# Patient Record
Sex: Female | Born: 1945
Health system: Southern US, Community
[De-identification: ages and names within clinical notes are randomized; demographics above are authoritative.]

## PROBLEM LIST (undated history)

## (undated) DIAGNOSIS — E782 Mixed hyperlipidemia: Secondary | ICD-10-CM

## (undated) DIAGNOSIS — R7989 Other specified abnormal findings of blood chemistry: Secondary | ICD-10-CM

## (undated) DIAGNOSIS — A159 Respiratory tuberculosis unspecified: Secondary | ICD-10-CM

## (undated) DIAGNOSIS — M8580 Other specified disorders of bone density and structure, unspecified site: Secondary | ICD-10-CM

## (undated) DIAGNOSIS — N189 Chronic kidney disease, unspecified: Secondary | ICD-10-CM

## (undated) DIAGNOSIS — N3946 Mixed incontinence: Secondary | ICD-10-CM

## (undated) DIAGNOSIS — I2510 Atherosclerotic heart disease of native coronary artery without angina pectoris: Secondary | ICD-10-CM

## (undated) DIAGNOSIS — IMO0001 Reserved for inherently not codable concepts without codable children: Secondary | ICD-10-CM

## (undated) DIAGNOSIS — I10 Essential (primary) hypertension: Secondary | ICD-10-CM

## (undated) DIAGNOSIS — I499 Cardiac arrhythmia, unspecified: Secondary | ICD-10-CM

## (undated) DIAGNOSIS — Z9119 Patient's noncompliance with other medical treatment and regimen: Secondary | ICD-10-CM

## (undated) DIAGNOSIS — E1129 Type 2 diabetes mellitus with other diabetic kidney complication: Secondary | ICD-10-CM

## (undated) DIAGNOSIS — J45909 Unspecified asthma, uncomplicated: Secondary | ICD-10-CM

## (undated) DIAGNOSIS — F411 Generalized anxiety disorder: Secondary | ICD-10-CM

## (undated) DIAGNOSIS — E559 Vitamin D deficiency, unspecified: Secondary | ICD-10-CM

## (undated) DIAGNOSIS — D638 Anemia in other chronic diseases classified elsewhere: Secondary | ICD-10-CM

## (undated) DIAGNOSIS — J449 Chronic obstructive pulmonary disease, unspecified: Secondary | ICD-10-CM

## (undated) DIAGNOSIS — Z91199 Patient's noncompliance with other medical treatment and regimen due to unspecified reason: Secondary | ICD-10-CM

## (undated) DIAGNOSIS — R45851 Suicidal ideations: Secondary | ICD-10-CM

## (undated) DIAGNOSIS — E1169 Type 2 diabetes mellitus with other specified complication: Secondary | ICD-10-CM

## (undated) DIAGNOSIS — R579 Shock, unspecified: Secondary | ICD-10-CM

## (undated) HISTORY — PX: CARPAL TUNNEL RELEASE: SHX101

## (undated) HISTORY — DX: Vitamin D deficiency, unspecified: E55.9

## (undated) HISTORY — DX: Patient's noncompliance with other medical treatment and regimen due to unspecified reason: Z91.199

## (undated) HISTORY — DX: Atherosclerotic heart disease of native coronary artery without angina pectoris: I25.10

## (undated) HISTORY — DX: Type 2 diabetes mellitus with other specified complication: E78.2

## (undated) HISTORY — DX: Type 2 diabetes mellitus with other specified complication: E11.69

## (undated) HISTORY — PX: ANKLE RECONSTRUCTION: SHX1151

## (undated) HISTORY — DX: Type 2 diabetes mellitus with other diabetic kidney complication: E11.29

## (undated) HISTORY — DX: Suicidal ideations: R45.851

## (undated) HISTORY — DX: Anemia in other chronic diseases classified elsewhere: D63.8

## (undated) HISTORY — DX: Other specified abnormal findings of blood chemistry: R79.89

## (undated) HISTORY — DX: Generalized anxiety disorder: F41.1

## (undated) HISTORY — DX: Mixed incontinence: N39.46

## (undated) HISTORY — DX: Chronic kidney disease, unspecified: N18.9

## (undated) HISTORY — DX: Patient's noncompliance with other medical treatment and regimen: Z91.19

## (undated) HISTORY — DX: Respiratory tuberculosis unspecified: A15.9

## (undated) HISTORY — PX: CORONARY ANGIOPLASTY: SHX604

## (undated) HISTORY — DX: Other specified disorders of bone density and structure, unspecified site: M85.80

---

## 1997-11-02 ENCOUNTER — Other Ambulatory Visit: Admission: RE | Admit: 1997-11-02 | Discharge: 1997-11-02 | Payer: Self-pay | Admitting: Obstetrics and Gynecology

## 1998-07-10 ENCOUNTER — Emergency Department (HOSPITAL_COMMUNITY): Admission: EM | Admit: 1998-07-10 | Discharge: 1998-07-10 | Payer: Self-pay | Admitting: Emergency Medicine

## 1998-07-11 ENCOUNTER — Encounter: Payer: Self-pay | Admitting: Emergency Medicine

## 2003-03-12 ENCOUNTER — Other Ambulatory Visit: Payer: Self-pay

## 2004-02-17 ENCOUNTER — Emergency Department: Payer: Self-pay | Admitting: Internal Medicine

## 2004-04-19 ENCOUNTER — Ambulatory Visit: Payer: Self-pay | Admitting: Internal Medicine

## 2004-11-08 ENCOUNTER — Emergency Department: Payer: Self-pay | Admitting: Emergency Medicine

## 2005-01-20 ENCOUNTER — Emergency Department: Payer: Self-pay | Admitting: Emergency Medicine

## 2005-04-12 ENCOUNTER — Inpatient Hospital Stay: Payer: Self-pay | Admitting: Internal Medicine

## 2005-04-12 ENCOUNTER — Other Ambulatory Visit: Payer: Self-pay

## 2005-06-18 ENCOUNTER — Ambulatory Visit: Payer: Self-pay | Admitting: Internal Medicine

## 2005-06-24 ENCOUNTER — Ambulatory Visit: Payer: Self-pay | Admitting: Internal Medicine

## 2005-06-27 ENCOUNTER — Ambulatory Visit: Payer: Self-pay | Admitting: Internal Medicine

## 2005-07-17 ENCOUNTER — Other Ambulatory Visit: Payer: Self-pay

## 2005-07-17 ENCOUNTER — Inpatient Hospital Stay: Payer: Self-pay | Admitting: Cardiology

## 2005-08-28 ENCOUNTER — Encounter: Payer: Self-pay | Admitting: Internal Medicine

## 2005-09-28 ENCOUNTER — Encounter: Payer: Self-pay | Admitting: Internal Medicine

## 2005-10-22 ENCOUNTER — Other Ambulatory Visit: Payer: Self-pay

## 2005-10-22 ENCOUNTER — Emergency Department: Payer: Self-pay

## 2005-10-28 ENCOUNTER — Encounter: Payer: Self-pay | Admitting: Internal Medicine

## 2006-02-28 HISTORY — PX: COLONOSCOPY: SHX174

## 2006-03-05 ENCOUNTER — Ambulatory Visit: Payer: Self-pay | Admitting: Unknown Physician Specialty

## 2006-03-05 LAB — HM COLONOSCOPY

## 2006-03-31 ENCOUNTER — Ambulatory Visit: Payer: Self-pay | Admitting: Internal Medicine

## 2007-03-27 ENCOUNTER — Ambulatory Visit: Payer: Self-pay | Admitting: Internal Medicine

## 2007-08-08 ENCOUNTER — Emergency Department: Payer: Self-pay | Admitting: Emergency Medicine

## 2007-08-08 ENCOUNTER — Other Ambulatory Visit: Payer: Self-pay

## 2007-10-09 ENCOUNTER — Other Ambulatory Visit: Payer: Self-pay

## 2007-10-09 ENCOUNTER — Emergency Department: Payer: Self-pay | Admitting: Internal Medicine

## 2008-04-25 ENCOUNTER — Ambulatory Visit: Payer: Self-pay

## 2008-06-02 ENCOUNTER — Ambulatory Visit: Payer: Self-pay | Admitting: Internal Medicine

## 2008-06-22 ENCOUNTER — Ambulatory Visit: Payer: Self-pay | Admitting: Internal Medicine

## 2008-10-19 ENCOUNTER — Ambulatory Visit: Payer: Self-pay | Admitting: Internal Medicine

## 2008-12-07 ENCOUNTER — Emergency Department: Payer: Self-pay | Admitting: Emergency Medicine

## 2009-02-07 ENCOUNTER — Emergency Department: Payer: Self-pay | Admitting: Internal Medicine

## 2009-05-09 ENCOUNTER — Ambulatory Visit: Payer: Self-pay | Admitting: Nephrology

## 2009-06-05 ENCOUNTER — Ambulatory Visit: Payer: Self-pay | Admitting: Internal Medicine

## 2009-08-10 ENCOUNTER — Encounter: Payer: Self-pay | Admitting: Surgery

## 2009-12-08 ENCOUNTER — Emergency Department: Payer: Self-pay | Admitting: Emergency Medicine

## 2009-12-11 ENCOUNTER — Other Ambulatory Visit: Payer: Self-pay | Admitting: Orthopedic Surgery

## 2010-04-09 ENCOUNTER — Emergency Department: Payer: Self-pay | Admitting: Emergency Medicine

## 2010-05-29 ENCOUNTER — Inpatient Hospital Stay: Payer: Self-pay | Admitting: Internal Medicine

## 2010-07-11 ENCOUNTER — Ambulatory Visit: Payer: Self-pay | Admitting: Internal Medicine

## 2010-07-11 LAB — HM MAMMOGRAPHY

## 2011-02-04 LAB — PULMONARY FUNCTION TEST

## 2011-05-29 ENCOUNTER — Emergency Department: Payer: Self-pay

## 2011-05-29 LAB — COMPREHENSIVE METABOLIC PANEL
Albumin: 3.8 g/dL (ref 3.4–5.0)
Alkaline Phosphatase: 86 U/L (ref 50–136)
Anion Gap: 9 (ref 7–16)
BUN: 24 mg/dL — ABNORMAL HIGH (ref 7–18)
Bilirubin,Total: 0.3 mg/dL (ref 0.2–1.0)
Calcium, Total: 9.8 mg/dL (ref 8.5–10.1)
Chloride: 100 mmol/L (ref 98–107)
Co2: 28 mmol/L (ref 21–32)
Creatinine: 1.55 mg/dL — ABNORMAL HIGH (ref 0.60–1.30)
EGFR (African American): 40 — ABNORMAL LOW
EGFR (Non-African Amer.): 35 — ABNORMAL LOW
Glucose: 362 mg/dL — ABNORMAL HIGH (ref 65–99)
Osmolality: 293 (ref 275–301)
Potassium: 3.9 mmol/L (ref 3.5–5.1)
SGOT(AST): 14 U/L — ABNORMAL LOW (ref 15–37)
SGPT (ALT): 15 U/L
Sodium: 137 mmol/L (ref 136–145)
Total Protein: 8.9 g/dL — ABNORMAL HIGH (ref 6.4–8.2)

## 2011-05-29 LAB — CBC
HCT: 33.5 % — ABNORMAL LOW (ref 35.0–47.0)
HGB: 10.7 g/dL — ABNORMAL LOW (ref 12.0–16.0)
MCH: 28.8 pg (ref 26.0–34.0)
MCHC: 32 g/dL (ref 32.0–36.0)
MCV: 90 fL (ref 80–100)
Platelet: 356 10*3/uL (ref 150–440)
RBC: 3.72 10*6/uL — ABNORMAL LOW (ref 3.80–5.20)
RDW: 14.7 % — ABNORMAL HIGH (ref 11.5–14.5)
WBC: 9.5 10*3/uL (ref 3.6–11.0)

## 2011-09-17 ENCOUNTER — Ambulatory Visit: Payer: Self-pay | Admitting: Internal Medicine

## 2011-09-17 LAB — HM MAMMOGRAPHY

## 2011-10-14 ENCOUNTER — Ambulatory Visit: Payer: Self-pay | Admitting: Internal Medicine

## 2011-10-29 ENCOUNTER — Ambulatory Visit: Payer: Self-pay | Admitting: Internal Medicine

## 2012-01-29 LAB — HM PAP SMEAR: HM Pap smear: NEGATIVE

## 2012-02-19 LAB — HM PAP SMEAR: HM Pap smear: NEGATIVE

## 2012-03-25 LAB — HM DIABETES EYE EXAM

## 2012-11-02 ENCOUNTER — Ambulatory Visit: Payer: Self-pay | Admitting: Physician Assistant

## 2012-11-02 LAB — HM MAMMOGRAPHY

## 2013-09-07 LAB — HM DIABETES EYE EXAM

## 2013-11-11 ENCOUNTER — Ambulatory Visit: Payer: Self-pay | Admitting: Physician Assistant

## 2013-11-11 LAB — HM MAMMOGRAPHY

## 2014-02-17 DIAGNOSIS — M79674 Pain in right toe(s): Secondary | ICD-10-CM | POA: Diagnosis not present

## 2014-02-17 DIAGNOSIS — B351 Tinea unguium: Secondary | ICD-10-CM | POA: Diagnosis not present

## 2014-02-17 DIAGNOSIS — M79675 Pain in left toe(s): Secondary | ICD-10-CM | POA: Diagnosis not present

## 2014-03-24 DIAGNOSIS — F331 Major depressive disorder, recurrent, moderate: Secondary | ICD-10-CM | POA: Diagnosis not present

## 2014-03-24 DIAGNOSIS — D638 Anemia in other chronic diseases classified elsewhere: Secondary | ICD-10-CM | POA: Diagnosis not present

## 2014-03-24 DIAGNOSIS — I2510 Atherosclerotic heart disease of native coronary artery without angina pectoris: Secondary | ICD-10-CM | POA: Diagnosis not present

## 2014-03-24 DIAGNOSIS — R079 Chest pain, unspecified: Secondary | ICD-10-CM | POA: Diagnosis not present

## 2014-03-24 DIAGNOSIS — I129 Hypertensive chronic kidney disease with stage 1 through stage 4 chronic kidney disease, or unspecified chronic kidney disease: Secondary | ICD-10-CM | POA: Diagnosis not present

## 2014-03-24 DIAGNOSIS — E1151 Type 2 diabetes mellitus with diabetic peripheral angiopathy without gangrene: Secondary | ICD-10-CM | POA: Diagnosis not present

## 2014-04-04 DIAGNOSIS — R42 Dizziness and giddiness: Secondary | ICD-10-CM | POA: Diagnosis not present

## 2014-04-04 DIAGNOSIS — E1165 Type 2 diabetes mellitus with hyperglycemia: Secondary | ICD-10-CM | POA: Diagnosis not present

## 2014-04-04 DIAGNOSIS — R0602 Shortness of breath: Secondary | ICD-10-CM | POA: Diagnosis not present

## 2014-04-04 DIAGNOSIS — R079 Chest pain, unspecified: Secondary | ICD-10-CM | POA: Diagnosis not present

## 2014-04-12 ENCOUNTER — Ambulatory Visit
Admit: 2014-04-12 | Disposition: A | Discharge status: Home / Self Care | Payer: Self-pay | Attending: Physician Assistant | Admitting: Physician Assistant

## 2014-04-12 DIAGNOSIS — Z794 Long term (current) use of insulin: Secondary | ICD-10-CM | POA: Diagnosis not present

## 2014-04-12 DIAGNOSIS — E119 Type 2 diabetes mellitus without complications: Secondary | ICD-10-CM | POA: Diagnosis not present

## 2014-04-13 DIAGNOSIS — R0602 Shortness of breath: Secondary | ICD-10-CM | POA: Diagnosis not present

## 2014-04-21 DIAGNOSIS — R55 Syncope and collapse: Secondary | ICD-10-CM | POA: Diagnosis not present

## 2014-04-21 DIAGNOSIS — R9431 Abnormal electrocardiogram [ECG] [EKG]: Secondary | ICD-10-CM | POA: Diagnosis not present

## 2014-04-29 ENCOUNTER — Ambulatory Visit
Admit: 2014-04-29 | Disposition: A | Discharge status: Home / Self Care | Payer: Self-pay | Attending: Physician Assistant | Admitting: Physician Assistant

## 2014-05-09 DIAGNOSIS — E119 Type 2 diabetes mellitus without complications: Secondary | ICD-10-CM | POA: Diagnosis not present

## 2014-05-09 DIAGNOSIS — Z794 Long term (current) use of insulin: Secondary | ICD-10-CM | POA: Diagnosis not present

## 2014-05-30 DIAGNOSIS — I4510 Unspecified right bundle-branch block: Secondary | ICD-10-CM | POA: Diagnosis not present

## 2014-05-30 DIAGNOSIS — E1151 Type 2 diabetes mellitus with diabetic peripheral angiopathy without gangrene: Secondary | ICD-10-CM | POA: Diagnosis not present

## 2014-05-30 DIAGNOSIS — I2510 Atherosclerotic heart disease of native coronary artery without angina pectoris: Secondary | ICD-10-CM | POA: Diagnosis not present

## 2014-05-30 DIAGNOSIS — I129 Hypertensive chronic kidney disease with stage 1 through stage 4 chronic kidney disease, or unspecified chronic kidney disease: Secondary | ICD-10-CM | POA: Diagnosis not present

## 2014-05-31 ENCOUNTER — Other Ambulatory Visit: Payer: Self-pay | Admitting: Internal Medicine

## 2014-05-31 DIAGNOSIS — I4510 Unspecified right bundle-branch block: Secondary | ICD-10-CM

## 2014-06-03 ENCOUNTER — Ambulatory Visit: Payer: Self-pay

## 2014-06-08 ENCOUNTER — Encounter
Admission: RE | Admit: 2014-06-08 | Discharge: 2014-06-08 | Disposition: A | Discharge status: Home / Self Care | Payer: Commercial Managed Care - HMO | Source: Ambulatory Visit | Attending: Internal Medicine | Admitting: Internal Medicine

## 2014-06-08 ENCOUNTER — Ambulatory Visit
Admission: RE | Admit: 2014-06-08 | Discharge: 2014-06-08 | Disposition: A | Discharge status: Home / Self Care | Payer: Commercial Managed Care - HMO | Source: Ambulatory Visit | Attending: Internal Medicine | Admitting: Internal Medicine

## 2014-06-08 DIAGNOSIS — I4510 Unspecified right bundle-branch block: Secondary | ICD-10-CM | POA: Insufficient documentation

## 2014-06-08 DIAGNOSIS — R0789 Other chest pain: Secondary | ICD-10-CM | POA: Diagnosis not present

## 2014-06-08 HISTORY — DX: Essential (primary) hypertension: I10

## 2014-06-08 HISTORY — DX: Cardiac arrhythmia, unspecified: I49.9

## 2014-06-08 HISTORY — DX: Reserved for inherently not codable concepts without codable children: IMO0001

## 2014-06-08 HISTORY — DX: Chronic obstructive pulmonary disease, unspecified: J44.9

## 2014-06-08 HISTORY — DX: Unspecified asthma, uncomplicated: J45.909

## 2014-06-08 LAB — NM MYOCAR MULTI W/SPECT W/WALL MOTION / EF
Estimated workload: 5.7 METS
LV dias vol: 48 mL
LV sys vol: 13 mL
Peak HR: 111 {beats}/min
Percent of predicted max HR: 73 %
SDS: 6
SRS: 2
SSS: 6
Stage 1 Grade: 0 %
Stage 1 HR: 70 {beats}/min
Stage 1 Speed: 0 mph
Stage 2 Grade: 0 %
Stage 2 HR: 77 {beats}/min
Stage 2 Speed: 1 mph
Stage 3 Grade: 0 %
Stage 3 HR: 77 {beats}/min
Stage 3 Speed: 1 mph
Stage 4 DBP: 57 mmHg
Stage 4 Grade: 10 %
Stage 4 HR: 97 {beats}/min
Stage 4 SBP: 158 mmHg
Stage 4 Speed: 1.7 mph
Stage 5 Grade: 12 %
Stage 5 HR: 109 {beats}/min
Stage 5 Speed: 2.5 mph
Stage 6 Grade: 2.1 %
Stage 6 HR: 111 {beats}/min
Stage 6 Speed: 2.5 mph
Stage 7 DBP: 61 mmHg
Stage 7 Grade: 0 %
Stage 7 HR: 107 {beats}/min
Stage 7 SBP: 186 mmHg
Stage 7 Speed: 0 mph
Stage 8 DBP: 62 mmHg
Stage 8 Grade: 0 %
Stage 8 HR: 86 {beats}/min
Stage 8 SBP: 156 mmHg
Stage 8 Speed: 0 mph
TID: 0.85

## 2014-06-08 MED ORDER — TECHNETIUM TC 99M SESTAMIBI GENERIC - CARDIOLITE
27.6190 | Freq: Once | INTRAVENOUS | Status: AC | PRN
  Administered 2014-06-08: 27.619 via INTRAVENOUS

## 2014-06-08 MED ORDER — TECHNETIUM TC 99M SESTAMIBI GENERIC - CARDIOLITE
12.8140 | Freq: Once | INTRAVENOUS | Status: AC | PRN
  Administered 2014-06-08: 12.814 via INTRAVENOUS

## 2014-06-30 DIAGNOSIS — Z0001 Encounter for general adult medical examination with abnormal findings: Secondary | ICD-10-CM | POA: Diagnosis not present

## 2014-06-30 DIAGNOSIS — E1151 Type 2 diabetes mellitus with diabetic peripheral angiopathy without gangrene: Secondary | ICD-10-CM | POA: Diagnosis not present

## 2014-06-30 DIAGNOSIS — D638 Anemia in other chronic diseases classified elsewhere: Secondary | ICD-10-CM | POA: Diagnosis not present

## 2014-06-30 DIAGNOSIS — I2510 Atherosclerotic heart disease of native coronary artery without angina pectoris: Secondary | ICD-10-CM | POA: Diagnosis not present

## 2014-06-30 DIAGNOSIS — E782 Mixed hyperlipidemia: Secondary | ICD-10-CM | POA: Diagnosis not present

## 2014-06-30 DIAGNOSIS — I129 Hypertensive chronic kidney disease with stage 1 through stage 4 chronic kidney disease, or unspecified chronic kidney disease: Secondary | ICD-10-CM | POA: Diagnosis not present

## 2014-06-30 DIAGNOSIS — R300 Dysuria: Secondary | ICD-10-CM | POA: Diagnosis not present

## 2014-07-28 DIAGNOSIS — Z0001 Encounter for general adult medical examination with abnormal findings: Secondary | ICD-10-CM | POA: Diagnosis not present

## 2014-07-28 DIAGNOSIS — E782 Mixed hyperlipidemia: Secondary | ICD-10-CM | POA: Diagnosis not present

## 2014-07-28 DIAGNOSIS — E1165 Type 2 diabetes mellitus with hyperglycemia: Secondary | ICD-10-CM | POA: Diagnosis not present

## 2014-08-08 ENCOUNTER — Emergency Department: Payer: Commercial Managed Care - HMO

## 2014-08-08 ENCOUNTER — Encounter: Payer: Self-pay | Admitting: *Deleted

## 2014-08-08 ENCOUNTER — Emergency Department
Admission: EM | Admit: 2014-08-08 | Discharge: 2014-08-08 | Disposition: A | Discharge status: Home / Self Care | Payer: Commercial Managed Care - HMO | Attending: Emergency Medicine | Admitting: Emergency Medicine

## 2014-08-08 DIAGNOSIS — S8002XA Contusion of left knee, initial encounter: Secondary | ICD-10-CM | POA: Insufficient documentation

## 2014-08-08 DIAGNOSIS — E119 Type 2 diabetes mellitus without complications: Secondary | ICD-10-CM | POA: Diagnosis not present

## 2014-08-08 DIAGNOSIS — Y998 Other external cause status: Secondary | ICD-10-CM | POA: Insufficient documentation

## 2014-08-08 DIAGNOSIS — Y92410 Unspecified street and highway as the place of occurrence of the external cause: Secondary | ICD-10-CM | POA: Diagnosis not present

## 2014-08-08 DIAGNOSIS — Y9389 Activity, other specified: Secondary | ICD-10-CM | POA: Diagnosis not present

## 2014-08-08 DIAGNOSIS — S8992XA Unspecified injury of left lower leg, initial encounter: Secondary | ICD-10-CM | POA: Diagnosis present

## 2014-08-08 DIAGNOSIS — I10 Essential (primary) hypertension: Secondary | ICD-10-CM | POA: Insufficient documentation

## 2014-08-08 DIAGNOSIS — M1712 Unilateral primary osteoarthritis, left knee: Secondary | ICD-10-CM | POA: Diagnosis not present
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
## 2014-08-08 NOTE — Discharge Instructions (Signed)
Contusion A contusion is a deep bruise. Contusions happen when an injury causes bleeding under the skin. Signs of bruising include pain, puffiness (swelling), and discolored skin. The contusion may turn blue, purple, or yellow. HOME CARE   Put ice on the injured area.  Put ice in a plastic bag.  Place a towel between your skin and the bag.  Leave the ice on for 15-20 minutes, 03-04 times a day.  Only take medicine as told by your doctor.  Rest the injured area.  If possible, raise (elevate) the injured area to lessen puffiness. GET HELP RIGHT AWAY IF:   You have more bruising or puffiness.  You have pain that is getting worse.  Your puffiness or pain is not helped by medicine. MAKE SURE YOU:   Understand these instructions.  Will watch your condition.  Will get help right away if you are not doing well or get worse. Document Released: 07/03/2007 Document Revised: 04/08/2011 Document Reviewed: 11/19/2010 [facility] Patient Information 2015 [place], [place]. This information is not intended to replace advice given to you by your health care provider. Make sure you discuss any questions you have with your health care provider.  Cryotherapy Cryotherapy is when you put ice on your injury. Ice helps lessen pain and puffiness (swelling) after an injury. Ice works the best when you start using it in the first 24 to 48 hours after an injury. HOME CARE  Put a dry or damp towel between the ice pack and your skin.  You may press gently on the ice pack.  Leave the ice on for no more than 10 to 20 minutes at a time.  Check your skin after 5 minutes to make sure your skin is okay.  Rest at least 20 minutes between ice pack uses.  Stop using ice when your skin loses feeling (numbness).  Do not use ice on someone who cannot tell you when it hurts. This includes small children and people with memory problems (dementia). GET HELP RIGHT AWAY IF:  You have white spots on your  skin.  Your skin turns blue or pale.  Your skin feels waxy or hard.  Your puffiness gets worse. MAKE SURE YOU:   Understand these instructions.  Will watch your condition.  Will get help right away if you are not doing well or get worse. Document Released: 07/03/2007 Document Revised: 04/08/2011 Document Reviewed: 09/06/2010 [facility] Patient Information 2015 [place], [place]. This information is not intended to replace advice given to you by your health care provider. Make sure you discuss any questions you have with your health care provider.

## 2014-09-16 DIAGNOSIS — M25562 Pain in left knee: Secondary | ICD-10-CM | POA: Diagnosis not present

## 2014-09-16 DIAGNOSIS — M1712 Unilateral primary osteoarthritis, left knee: Secondary | ICD-10-CM | POA: Diagnosis not present

## 2014-10-04 DIAGNOSIS — I10 Essential (primary) hypertension: Secondary | ICD-10-CM | POA: Diagnosis not present

## 2014-10-04 DIAGNOSIS — E1165 Type 2 diabetes mellitus with hyperglycemia: Secondary | ICD-10-CM | POA: Diagnosis not present

## 2014-10-04 DIAGNOSIS — E782 Mixed hyperlipidemia: Secondary | ICD-10-CM | POA: Diagnosis not present

## 2014-10-04 DIAGNOSIS — F331 Major depressive disorder, recurrent, moderate: Secondary | ICD-10-CM | POA: Diagnosis not present

## 2014-10-04 DIAGNOSIS — D638 Anemia in other chronic diseases classified elsewhere: Secondary | ICD-10-CM | POA: Diagnosis not present

## 2014-10-05 DIAGNOSIS — M79674 Pain in right toe(s): Secondary | ICD-10-CM | POA: Diagnosis not present

## 2014-10-05 DIAGNOSIS — B351 Tinea unguium: Secondary | ICD-10-CM | POA: Diagnosis not present

## 2014-10-05 DIAGNOSIS — M79675 Pain in left toe(s): Secondary | ICD-10-CM | POA: Diagnosis not present

## 2014-11-28 DIAGNOSIS — M8588 Other specified disorders of bone density and structure, other site: Secondary | ICD-10-CM | POA: Diagnosis not present

## 2014-11-28 DIAGNOSIS — Z1231 Encounter for screening mammogram for malignant neoplasm of breast: Secondary | ICD-10-CM | POA: Diagnosis not present

## 2014-11-28 DIAGNOSIS — Z780 Asymptomatic menopausal state: Secondary | ICD-10-CM | POA: Diagnosis not present

## 2014-11-28 DIAGNOSIS — Z13820 Encounter for screening for osteoporosis: Secondary | ICD-10-CM | POA: Diagnosis not present

## 2014-11-28 DIAGNOSIS — E2839 Other primary ovarian failure: Secondary | ICD-10-CM | POA: Diagnosis not present

## 2014-11-28 DIAGNOSIS — N649 Disorder of breast, unspecified: Secondary | ICD-10-CM | POA: Diagnosis not present

## 2014-11-28 LAB — HM DEXA SCAN

## 2014-11-28 LAB — HM MAMMOGRAPHY

## 2015-01-02 DIAGNOSIS — I10 Essential (primary) hypertension: Secondary | ICD-10-CM | POA: Diagnosis not present

## 2015-01-02 DIAGNOSIS — E1165 Type 2 diabetes mellitus with hyperglycemia: Secondary | ICD-10-CM | POA: Diagnosis not present

## 2015-01-02 DIAGNOSIS — N189 Chronic kidney disease, unspecified: Secondary | ICD-10-CM | POA: Diagnosis not present

## 2015-01-02 DIAGNOSIS — E782 Mixed hyperlipidemia: Secondary | ICD-10-CM | POA: Diagnosis not present

## 2015-01-02 DIAGNOSIS — J069 Acute upper respiratory infection, unspecified: Secondary | ICD-10-CM | POA: Diagnosis not present

## 2015-01-20 DIAGNOSIS — R928 Other abnormal and inconclusive findings on diagnostic imaging of breast: Secondary | ICD-10-CM | POA: Diagnosis not present

## 2015-01-20 LAB — HM MAMMOGRAPHY

## 2015-03-25 ENCOUNTER — Emergency Department
Admission: EM | Admit: 2015-03-25 | Discharge: 2015-03-25 | Disposition: A | Discharge status: Home / Self Care | Payer: Commercial Managed Care - HMO | Attending: Emergency Medicine | Admitting: Emergency Medicine

## 2015-03-25 ENCOUNTER — Emergency Department: Payer: Commercial Managed Care - HMO

## 2015-03-25 ENCOUNTER — Encounter: Payer: Self-pay | Admitting: Emergency Medicine

## 2015-03-25 DIAGNOSIS — E1165 Type 2 diabetes mellitus with hyperglycemia: Secondary | ICD-10-CM | POA: Diagnosis not present

## 2015-03-25 DIAGNOSIS — I10 Essential (primary) hypertension: Secondary | ICD-10-CM | POA: Diagnosis not present

## 2015-03-25 DIAGNOSIS — Z7984 Long term (current) use of oral hypoglycemic drugs: Secondary | ICD-10-CM | POA: Diagnosis not present

## 2015-03-25 DIAGNOSIS — J9811 Atelectasis: Secondary | ICD-10-CM | POA: Diagnosis not present

## 2015-03-25 DIAGNOSIS — Z794 Long term (current) use of insulin: Secondary | ICD-10-CM | POA: Diagnosis not present

## 2015-03-25 DIAGNOSIS — R739 Hyperglycemia, unspecified: Secondary | ICD-10-CM

## 2015-03-25 DIAGNOSIS — Z87891 Personal history of nicotine dependence: Secondary | ICD-10-CM | POA: Diagnosis not present

## 2015-03-25 DIAGNOSIS — Z9119 Patient's noncompliance with other medical treatment and regimen: Secondary | ICD-10-CM | POA: Diagnosis not present

## 2015-03-25 DIAGNOSIS — Z9114 Patient's other noncompliance with medication regimen: Secondary | ICD-10-CM | POA: Diagnosis not present

## 2015-03-25 LAB — CBC WITH DIFFERENTIAL/PLATELET
Basophils Absolute: 0.1 10*3/uL (ref 0–0.1)
Basophils Relative: 1 %
Eosinophils Absolute: 0.2 10*3/uL (ref 0–0.7)
Eosinophils Relative: 2 %
HCT: 32.7 % — ABNORMAL LOW (ref 35.0–47.0)
Hemoglobin: 11 g/dL — ABNORMAL LOW (ref 12.0–16.0)
Lymphocytes Relative: 23 %
Lymphs Abs: 2 10*3/uL (ref 1.0–3.6)
MCH: 30 pg (ref 26.0–34.0)
MCHC: 33.6 g/dL (ref 32.0–36.0)
MCV: 89.2 fL (ref 80.0–100.0)
Monocytes Absolute: 0.5 10*3/uL (ref 0.2–0.9)
Monocytes Relative: 6 %
Neutro Abs: 5.8 10*3/uL (ref 1.4–6.5)
Neutrophils Relative %: 68 %
Platelets: 284 10*3/uL (ref 150–440)
RBC: 3.67 MIL/uL — ABNORMAL LOW (ref 3.80–5.20)
RDW: 14.4 % (ref 11.5–14.5)
WBC: 8.5 10*3/uL (ref 3.6–11.0)

## 2015-03-25 LAB — URINALYSIS COMPLETE WITH MICROSCOPIC (ARMC ONLY)
Bilirubin Urine: NEGATIVE
Glucose, UA: >500 mg/dL — AB
Hgb urine dipstick: NEGATIVE
Ketones, ur: NEGATIVE mg/dL
Leukocytes, UA: NEGATIVE
Nitrite: NEGATIVE
Protein, ur: NEGATIVE mg/dL
Specific Gravity, Urine: 1.021 (ref 1.005–1.030)
pH: 6 (ref 5.0–8.0)

## 2015-03-25 LAB — COMPREHENSIVE METABOLIC PANEL
ALT: 12 U/L — ABNORMAL LOW (ref 14–54)
AST: 24 U/L (ref 15–41)
Albumin: 3.6 g/dL (ref 3.5–5.0)
Alkaline Phosphatase: 82 U/L (ref 38–126)
Anion gap: 9 (ref 5–15)
BUN: 29 mg/dL — ABNORMAL HIGH (ref 6–20)
CO2: 25 mmol/L (ref 22–32)
Calcium: 9.5 mg/dL (ref 8.9–10.3)
Chloride: 97 mmol/L — ABNORMAL LOW (ref 101–111)
Creatinine, Ser: 1.84 mg/dL — ABNORMAL HIGH (ref 0.44–1.00)
GFR calc Af Amer: 31 mL/min — ABNORMAL LOW (ref >60)
GFR calc non Af Amer: 27 mL/min — ABNORMAL LOW (ref >60)
Glucose, Bld: 594 mg/dL (ref 65–99)
Potassium: 4.2 mmol/L (ref 3.5–5.1)
Sodium: 131 mmol/L — ABNORMAL LOW (ref 135–145)
Total Bilirubin: 0.6 mg/dL (ref 0.3–1.2)
Total Protein: 7.5 g/dL (ref 6.5–8.1)

## 2015-03-25 LAB — TROPONIN I
Troponin I: 0.04 ng/mL — ABNORMAL HIGH (ref <0.031)
Troponin I: <0.03 ng/mL (ref <0.031)

## 2015-03-25 LAB — GLUCOSE, CAPILLARY
Glucose-Capillary: 560 mg/dL (ref 65–99)
Glucose-Capillary: 383 mg/dL — ABNORMAL HIGH (ref 65–99)
Glucose-Capillary: 329 mg/dL — ABNORMAL HIGH (ref 65–99)
Glucose-Capillary: 308 mg/dL — ABNORMAL HIGH (ref 65–99)

## 2015-03-25 LAB — RAPID INFLUENZA A&B ANTIGENS
Influenza A (ARMC): NOT DETECTED
Influenza B (ARMC): NOT DETECTED

## 2015-03-25 MED ORDER — LIRAGLUTIDE 18 MG/3ML ~~LOC~~ SOPN: 1.0000 mg | PEN_INJECTOR | Freq: Every morning | SUBCUTANEOUS | Status: DC

## 2015-03-25 MED ORDER — ONDANSETRON HCL 4 MG/2ML IJ SOLN
4.0000 mg | Freq: Once | INTRAMUSCULAR | Status: AC
Start: 2015-03-25 — End: 2015-03-25
  Administered 2015-03-25: 4 mg via INTRAVENOUS
  Filled 2015-03-25: qty 2

## 2015-03-25 MED ORDER — FENOFIBRATE 145 MG PO TABS
145.0000 mg | ORAL_TABLET | Freq: Every day | ORAL | Status: DC
Start: 2015-03-25 — End: 2016-03-01

## 2015-03-25 MED ORDER — SODIUM CHLORIDE 0.9 % IV BOLUS (SEPSIS)
1000.0000 mL | Freq: Once | INTRAVENOUS | Status: AC
  Administered 2015-03-25: 1000 mL via INTRAVENOUS

## 2015-03-25 MED ORDER — INSULIN ASPART 100 UNIT/ML ~~LOC~~ SOLN
10.0000 [IU] | Freq: Once | SUBCUTANEOUS | Status: AC
  Administered 2015-03-25: 10 [IU] via INTRAVENOUS
  Filled 2015-03-25: qty 10

## 2015-03-25 MED ORDER — INSULIN LISPRO PROT & LISPRO (50-50 MIX) 100 UNIT/ML KWIKPEN: 32.0000 [IU] | PEN_INJECTOR | Freq: Three times a day (TID) | SUBCUTANEOUS | Status: DC

## 2015-03-25 MED ORDER — INSULIN DETEMIR 100 UNIT/ML FLEXPEN: 55.0000 [IU] | PEN_INJECTOR | Freq: Every day | SUBCUTANEOUS | Status: DC

## 2015-03-25 NOTE — ED Notes (Signed)
States blood sugar has been high x 4 days. States ran out of insulin pen 2 days ago. States blood sugar over 400 at home.

## 2015-03-25 NOTE — Discharge Instructions (Signed)
Hyperglycemia °Hyperglycemia occurs when the glucose (sugar) in your blood is too high. Hyperglycemia can happen for many reasons, but it most often happens to people who do not know they have diabetes or are not managing their diabetes properly.  °CAUSES  °Whether you have diabetes or not, there are other causes of hyperglycemia. Hyperglycemia can occur when you have diabetes, but it can also occur in other situations that you might not be as aware of, such as: °Diabetes °· If you have diabetes and are having problems controlling your blood glucose, hyperglycemia could occur because of some of the following reasons: °¨ Not following your meal plan. °¨ Not taking your diabetes medications or not taking it properly. °¨ Exercising less or doing less activity than you normally do. °¨ Being sick. °Pre-diabetes °· This cannot be ignored. Before people develop Type 2 diabetes, they almost always have "pre-diabetes." This is when your blood glucose levels are higher than normal, but not yet high enough to be diagnosed as diabetes. Research has shown that some long-term damage to the body, especially the heart and circulatory system, may already be occurring during pre-diabetes. If you take action to manage your blood glucose when you have pre-diabetes, you may delay or prevent Type 2 diabetes from developing. °Stress °· If you have diabetes, you may be "diet" controlled or on oral medications or insulin to control your diabetes. However, you may find that your blood glucose is higher than usual in the hospital whether you have diabetes or not. This is often referred to as "stress hyperglycemia." Stress can elevate your blood glucose. This happens because of hormones put out by the body during times of stress. If stress has been the cause of your high blood glucose, it can be followed regularly by your caregiver. That way he/she can make sure your hyperglycemia does not continue to get worse or progress to  diabetes. °Steroids °· Steroids are medications that act on the infection fighting system (immune system) to block inflammation or infection. One side effect can be a rise in blood glucose. Most people can produce enough extra insulin to allow for this rise, but for those who cannot, steroids make blood glucose levels go even higher. It is not unusual for steroid treatments to "uncover" diabetes that is developing. It is not always possible to determine if the hyperglycemia will go away after the steroids are stopped. A special blood test called an A1c is sometimes done to determine if your blood glucose was elevated before the steroids were started. °SYMPTOMS °· Thirsty. °· Frequent urination. °· Dry mouth. °· Blurred vision. °· Tired or fatigue. °· Weakness. °· Sleepy. °· Tingling in feet or leg. °DIAGNOSIS  °Diagnosis is made by monitoring blood glucose in one or all of the following ways: °· A1c test. This is a chemical found in your blood. °· Fingerstick blood glucose monitoring. °· Laboratory results. °TREATMENT  °First, knowing the cause of the hyperglycemia is important before the hyperglycemia can be treated. Treatment may include, but is not be limited to: °· Education. °· Change or adjustment in medications. °· Change or adjustment in meal plan. °· Treatment for an illness, infection, etc. °· More frequent blood glucose monitoring. °· Change in exercise plan. °· Decreasing or stopping steroids. °· Lifestyle changes. °HOME CARE INSTRUCTIONS  °· Test your blood glucose as directed. °· Exercise regularly. Your caregiver will give you instructions about exercise. Pre-diabetes or diabetes which comes on with stress is helped by exercising. °· Eat wholesome,   balanced meals. Eat often and at regular, fixed times. Your caregiver or nutritionist will give you a meal plan to guide your sugar intake. °· Being at an ideal weight is important. If needed, losing as little as 10 to 15 pounds may help improve blood  glucose levels. °SEEK MEDICAL CARE IF:  °· You have questions about medicine, activity, or diet. °· You continue to have symptoms (problems such as increased thirst, urination, or weight gain). °SEEK IMMEDIATE MEDICAL CARE IF:  °· You are vomiting or have diarrhea. °· Your breath smells fruity. °· You are breathing faster or slower. °· You are very sleepy or incoherent. °· You have numbness, tingling, or pain in your feet or hands. °· You have chest pain. °· Your symptoms get worse even though you have been following your caregiver's orders. °· If you have any other questions or concerns. °  °This information is not intended to replace advice given to you by your health care provider. Make sure you discuss any questions you have with your health care provider. °  °Document Released: 07/10/2000 Document Revised: 04/08/2011 Document Reviewed: 09/20/2014 °Elsevier Interactive Patient Education ©2016 [employer] ° °

## 2015-03-25 NOTE — ED Provider Notes (Signed)
Time Seen: Approximately 1520  I have reviewed the triage notes  Chief Complaint: Hyperglycemia   History of Present Illness: Courtney Goodman is a 70 y.o. female who has a history of insulin-dependent diabetes, COPD, hypertension, etc. Patient states that she's been under a lot stress recently as missed some dosages of her insulin and pills for her diabetes. She's noticed that her blood sugars run high now for the last 4 days. She describes hives being over 400. She states she ran out of her insulin pen 2 days ago. She denies any fever, productive cough, dysuria though she has had some urinary frequency. She denies any chest or abdominal pain. She denies any melena or hematochezia.   Past Medical History  Diagnosis Date  . Asthma   . Hypertension   . Dysrhythmia   . COPD (chronic obstructive pulmonary disease) ([place])   . Shortness of breath dyspnea   . Diabetes mellitus without complication ([place])     There are no active problems to display for this patient.   Past Surgical History  Procedure Laterality Date  . Coronary angioplasty      Past Surgical History  Procedure Laterality Date  . Coronary angioplasty      No current outpatient prescriptions on file.  Allergies:  Review of patient's allergies indicates no known allergies.  Family History: History reviewed. No pertinent family history.  Social History: Social History  Substance Use Topics  . Smoking status: Former Research scientist (life sciences)  . Smokeless tobacco: None  . Alcohol Use: No     Review of Systems:   10 point review of systems was performed and was otherwise negative:  Constitutional: No fever Eyes: No visual disturbances ENT: No sore throat, ear pain Cardiac: No chest pain Respiratory: No shortness of breath, wheezing, or stridor Abdomen: No abdominal pain, no vomiting, No diarrhea Endocrine: No weight loss, No night sweats Extremities: No peripheral edema, cyanosis Skin: No rashes, easy  bruising Neurologic: No focal weakness, trouble with speech or swollowing Urologic: No dysuria, Hematuria, or urinary frequency   Physical Exam:  ED Triage Vitals  Enc Vitals Group     BP 03/25/15 1437 156/74 mmHg     Pulse Rate 03/25/15 1437 84     Resp 03/25/15 1437 18     Temp 03/25/15 1437 98.4 F (36.9 C)     Temp Source 03/25/15 1437 Oral     SpO2 03/25/15 1437 98 %     Weight 03/25/15 1437 180 lb (81.647 kg)     Height 03/25/15 1437 5\' 4"  (1.626 m)     Head Cir --      Peak Flow --      Pain Score 03/25/15 1438 9     Pain Loc --      Pain Edu? --      Excl. in [place]? --     General: Awake , Alert , and Oriented times 3; GCS 15 Head: Normal cephalic , atraumatic Eyes: Pupils equal , round, reactive to light Nose/Throat: No nasal drainage, patent upper airway without erythema or exudate.  Neck: Supple, Full range of motion, No anterior adenopathy or palpable thyroid masses Lungs: Clear to ascultation without wheezes , rhonchi, or rales Heart: Regular rate, regular rhythm without murmurs , gallops , or rubs Abdomen: Soft, non tender without rebound, guarding , or rigidity; bowel sounds positive and symmetric in all 4 quadrants. No organomegaly .        Extremities: 2 plus symmetric pulses. No edema,  clubbing or cyanosis Neurologic: normal ambulation, Motor symmetric without deficits, sensory intact Skin: warm, dry, no rashes   Labs:   All laboratory work was reviewed including any pertinent negatives or positives listed below:  Labs Reviewed  CBC WITH DIFFERENTIAL/PLATELET - Abnormal; Notable for the following:    RBC 3.67 (*)    Hemoglobin 11.0 (*)    HCT 32.7 (*)    All other components within normal limits  GLUCOSE, CAPILLARY - Abnormal; Notable for the following:    Glucose-Capillary 560 (*)    All other components within normal limits  RAPID INFLUENZA A&B ANTIGENS ([facility] ONLY)  COMPREHENSIVE METABOLIC PANEL  TROPONIN I  URINALYSIS COMPLETEWITH MICROSCOPIC  ([facility] ONLY)   serial troponins were performed which were overall negative  Review of laboratory work shows hyperglycemia without any evidence of diabetic ketoacidosis. EKG:  ED ECG REPORT I, Daymon Larsen, the attending physician, personally viewed and interpreted this ECG.  Date: 03/25/2015 EKG Time: *1539 Rate: 84 Rhythm: normal sinus rhythm QRS Axis: normal Intervals: Right bundle-branch block pattern ST/T Wave abnormalities: normal Conduction Disturbances: none Narrative Interpretation: unremarkable Left ventricular hypertrophy No acute ischemic changes are noted  Radiology: *    Narrative:    CLINICAL DATA: Patient with elevated blood sugar.  EXAM: CHEST 2 VIEW  COMPARISON: Chest radiograph 02/07/2009  FINDINGS: Stable cardiac and mediastinal contours. Minimal linear opacities left lung base. No large area of pulmonary consolidation. No pleural effusion or pneumothorax. Thoracic spine degenerative changes.  IMPRESSION: Left basilar atelectasis. No acute cardiopulmonary process.   Electronically Signed By: Lovey Newcomer M.D. On: 03/25/2015 18:09    I personally reviewed the radiologic studies   P  ED Course:  Patient's stay here was uneventful and she was given a overall 2 L normal saline bolus. She was given 10 units of IV regular insulin and observed for a period time here in emergency department. She was given Zofran for nausea and was able tolerate oral fluids and felt symptomatic relief. She was able to eat once we got her blood sugar close to 300. I felt she could be allowed to go home as her hyperglycemia seemed to stem from running out of her medications. The more we spoke the more and it was understood that she had run out of multiple of her medications for her diabetes. She was trying to extend about the amount of her medication and eventually ran out. Patient was given prescriptions and advised to follow up with her primary physician for  recheck. She should return here if she has any chest pain, abdominal pain, fever or any other concerns.    Assessment: * Hyperglycemia Insulin-dependent diabetes Noncompliance      Plan: * Outpatient management Patient was advised to return immediately if condition worsens. Patient was advised to follow up with their primary care physician or other specialized physicians involved in their outpatient care             Daymon Larsen, MD 03/25/15 2226

## 2015-04-06 DIAGNOSIS — I129 Hypertensive chronic kidney disease with stage 1 through stage 4 chronic kidney disease, or unspecified chronic kidney disease: Secondary | ICD-10-CM | POA: Diagnosis not present

## 2015-04-06 DIAGNOSIS — E1151 Type 2 diabetes mellitus with diabetic peripheral angiopathy without gangrene: Secondary | ICD-10-CM | POA: Diagnosis not present

## 2015-04-06 DIAGNOSIS — F331 Major depressive disorder, recurrent, moderate: Secondary | ICD-10-CM | POA: Diagnosis not present

## 2015-04-06 DIAGNOSIS — D638 Anemia in other chronic diseases classified elsewhere: Secondary | ICD-10-CM | POA: Diagnosis not present

## 2015-04-24 DIAGNOSIS — E1151 Type 2 diabetes mellitus with diabetic peripheral angiopathy without gangrene: Secondary | ICD-10-CM | POA: Diagnosis not present

## 2015-04-24 DIAGNOSIS — I129 Hypertensive chronic kidney disease with stage 1 through stage 4 chronic kidney disease, or unspecified chronic kidney disease: Secondary | ICD-10-CM | POA: Diagnosis not present

## 2015-04-24 DIAGNOSIS — G4733 Obstructive sleep apnea (adult) (pediatric): Secondary | ICD-10-CM | POA: Diagnosis not present

## 2015-04-24 DIAGNOSIS — D638 Anemia in other chronic diseases classified elsewhere: Secondary | ICD-10-CM | POA: Diagnosis not present

## 2015-05-26 DIAGNOSIS — I129 Hypertensive chronic kidney disease with stage 1 through stage 4 chronic kidney disease, or unspecified chronic kidney disease: Secondary | ICD-10-CM | POA: Diagnosis not present

## 2015-05-26 DIAGNOSIS — D638 Anemia in other chronic diseases classified elsewhere: Secondary | ICD-10-CM | POA: Diagnosis not present

## 2015-05-26 DIAGNOSIS — E1151 Type 2 diabetes mellitus with diabetic peripheral angiopathy without gangrene: Secondary | ICD-10-CM | POA: Diagnosis not present

## 2015-06-29 LAB — HM DIABETES FOOT EXAM

## 2015-07-21 DIAGNOSIS — G4733 Obstructive sleep apnea (adult) (pediatric): Secondary | ICD-10-CM | POA: Diagnosis not present

## 2015-07-21 DIAGNOSIS — E1122 Type 2 diabetes mellitus with diabetic chronic kidney disease: Secondary | ICD-10-CM | POA: Diagnosis not present

## 2015-07-21 DIAGNOSIS — I10 Essential (primary) hypertension: Secondary | ICD-10-CM | POA: Diagnosis not present

## 2015-07-21 DIAGNOSIS — D638 Anemia in other chronic diseases classified elsewhere: Secondary | ICD-10-CM | POA: Diagnosis not present

## 2015-07-21 DIAGNOSIS — F331 Major depressive disorder, recurrent, moderate: Secondary | ICD-10-CM | POA: Diagnosis not present

## 2015-07-21 DIAGNOSIS — Z0001 Encounter for general adult medical examination with abnormal findings: Secondary | ICD-10-CM | POA: Diagnosis not present

## 2015-07-21 DIAGNOSIS — I2510 Atherosclerotic heart disease of native coronary artery without angina pectoris: Secondary | ICD-10-CM | POA: Diagnosis not present

## 2015-07-21 DIAGNOSIS — E782 Mixed hyperlipidemia: Secondary | ICD-10-CM | POA: Diagnosis not present

## 2015-07-24 ENCOUNTER — Ambulatory Visit
Admission: RE | Admit: 2015-07-24 | Discharge: 2015-07-24 | Disposition: A | Discharge status: Home / Self Care | Payer: Commercial Managed Care - HMO | Source: Ambulatory Visit | Attending: Physician Assistant | Admitting: Physician Assistant

## 2015-07-24 ENCOUNTER — Other Ambulatory Visit: Payer: Self-pay | Admitting: Physician Assistant

## 2015-07-24 DIAGNOSIS — M25511 Pain in right shoulder: Secondary | ICD-10-CM | POA: Insufficient documentation

## 2015-07-24 DIAGNOSIS — W19XXXA Unspecified fall, initial encounter: Secondary | ICD-10-CM

## 2015-07-24 DIAGNOSIS — S4991XA Unspecified injury of right shoulder and upper arm, initial encounter: Secondary | ICD-10-CM | POA: Diagnosis not present

## 2015-11-21 ENCOUNTER — Telehealth: Payer: Self-pay | Admitting: Internal Medicine

## 2015-11-21 NOTE — Telephone Encounter (Signed)
Rcvd office notes, xrays, ct, ekg, labs from [facility]

## 2015-11-22 ENCOUNTER — Encounter: Payer: Self-pay | Admitting: Internal Medicine

## 2015-11-23 ENCOUNTER — Encounter: Payer: Self-pay | Admitting: Family Medicine

## 2015-11-23 ENCOUNTER — Ambulatory Visit (INDEPENDENT_AMBULATORY_CARE_PROVIDER_SITE_OTHER): Payer: Commercial Managed Care - HMO | Admitting: Family Medicine

## 2015-11-23 VITALS — BP 118/62 | HR 80 | Wt 179.6 lb

## 2015-11-23 DIAGNOSIS — E559 Vitamin D deficiency, unspecified: Secondary | ICD-10-CM | POA: Diagnosis not present

## 2015-11-23 DIAGNOSIS — Z23 Encounter for immunization: Secondary | ICD-10-CM | POA: Diagnosis not present

## 2015-11-23 DIAGNOSIS — Z79899 Other long term (current) drug therapy: Secondary | ICD-10-CM

## 2015-11-23 DIAGNOSIS — I10 Essential (primary) hypertension: Secondary | ICD-10-CM | POA: Diagnosis not present

## 2015-11-23 DIAGNOSIS — G4733 Obstructive sleep apnea (adult) (pediatric): Secondary | ICD-10-CM | POA: Diagnosis not present

## 2015-11-23 DIAGNOSIS — D649 Anemia, unspecified: Secondary | ICD-10-CM | POA: Diagnosis not present

## 2015-11-23 DIAGNOSIS — N189 Chronic kidney disease, unspecified: Secondary | ICD-10-CM | POA: Diagnosis not present

## 2015-11-23 DIAGNOSIS — N183 Chronic kidney disease, stage 3 unspecified: Secondary | ICD-10-CM | POA: Insufficient documentation

## 2015-11-23 DIAGNOSIS — Z794 Long term (current) use of insulin: Secondary | ICD-10-CM

## 2015-11-23 DIAGNOSIS — I499 Cardiac arrhythmia, unspecified: Secondary | ICD-10-CM | POA: Insufficient documentation

## 2015-11-23 DIAGNOSIS — Z7689 Persons encountering health services in other specified circumstances: Secondary | ICD-10-CM | POA: Diagnosis not present

## 2015-11-23 DIAGNOSIS — J449 Chronic obstructive pulmonary disease, unspecified: Secondary | ICD-10-CM | POA: Insufficient documentation

## 2015-11-23 DIAGNOSIS — E11649 Type 2 diabetes mellitus with hypoglycemia without coma: Secondary | ICD-10-CM

## 2015-11-23 LAB — COMPLETE METABOLIC PANEL WITH GFR
ALT: 9 U/L (ref 6–29)
AST: 19 U/L (ref 10–35)
Albumin: 4.1 g/dL (ref 3.6–5.1)
Alkaline Phosphatase: 50 U/L (ref 33–130)
BUN: 31 mg/dL — ABNORMAL HIGH (ref 7–25)
CO2: 23 mmol/L (ref 20–31)
Calcium: 9.9 mg/dL (ref 8.6–10.4)
Chloride: 107 mmol/L (ref 98–110)
Creat: 1.66 mg/dL — ABNORMAL HIGH (ref 0.60–0.93)
GFR, Est African American: 36 mL/min — ABNORMAL LOW (ref >=60)
GFR, Est Non African American: 31 mL/min — ABNORMAL LOW (ref >=60)
Glucose, Bld: 51 mg/dL — ABNORMAL LOW (ref 65–99)
Potassium: 4.1 mmol/L (ref 3.5–5.3)
Sodium: 142 mmol/L (ref 135–146)
Total Bilirubin: 0.3 mg/dL (ref 0.2–1.2)
Total Protein: 7.9 g/dL (ref 6.1–8.1)

## 2015-11-23 LAB — CBC WITH DIFFERENTIAL/PLATELET
Basophils Absolute: 0 cells/uL (ref 0–200)
Basophils Relative: 0 %
Eosinophils Absolute: 170 cells/uL (ref 15–500)
Eosinophils Relative: 2 %
HCT: 32.4 % — ABNORMAL LOW (ref 35.0–45.0)
Hemoglobin: 10.6 g/dL — ABNORMAL LOW (ref 11.7–15.5)
Lymphocytes Relative: 21 %
Lymphs Abs: 1785 cells/uL (ref 850–3900)
MCH: 29.8 pg (ref 27.0–33.0)
MCHC: 32.7 g/dL (ref 32.0–36.0)
MCV: 91 fL (ref 80.0–100.0)
MPV: 10.4 fL (ref 7.5–12.5)
Monocytes Absolute: 680 cells/uL (ref 200–950)
Monocytes Relative: 8 %
Neutro Abs: 5865 cells/uL (ref 1500–7800)
Neutrophils Relative %: 69 %
Platelets: 385 10*3/uL (ref 140–400)
RBC: 3.56 MIL/uL — ABNORMAL LOW (ref 3.80–5.10)
RDW: 15.5 % — ABNORMAL HIGH (ref 11.0–15.0)
WBC: 8.5 10*3/uL (ref 4.0–10.5)

## 2015-11-23 LAB — GLUCOSE, POCT (MANUAL RESULT ENTRY)
POC Glucose: 52 mg/dl — AB (ref 70–99)
POC Glucose: 80 mg/dl (ref 70–99)

## 2015-11-23 LAB — POCT GLYCOSYLATED HEMOGLOBIN (HGB A1C): Hemoglobin A1C: 6.5

## 2015-11-23 LAB — TSH: TSH: 2.55 mIU/L

## 2015-11-23 NOTE — Patient Instructions (Addendum)
Your hemoglobin A1c today is 6.5. I'm concerned that you are having low blood sugars.  Stop taking Januvia,   Do not take Novolog (meal time insulin) if your blood sugar is LESS THAN 130.   Continue on Victoza and Levemir at your current doses.  Continue checking your blood sugars 3-4 times daily. Call If you are having lows (less than 80).   I am referring you to a diabetes specialist, endocrinologist, and they will call you to schedule an appointment.  I am also referring you to have a sleep study done. They will call you to schedule this.  Please contact your cardiologist and nephrologist, kidney doctor, and follow-up with them. If you do not plan to follow-up with them then I need you to sign a medical release and get the records to me so that I can refer you to specialists here in town.   Return in 2 weeks for a follow-up with me.  I will call you with your lab results.

## 2015-11-23 NOTE — Progress Notes (Signed)
Subjective:    Patient ID: Courtney Goodman, female    DOB: 1945/07/27, 70 y.o.   MRN: [id number]  HPI Chief Complaint  Patient presents with  . new pt    new pt get established.    She is new to the practice and here to establish care. Moved here from [place] 2 months ago to be close to her daughter. Has not been to previous provider in 3 months.  Obstructive sleep apnea and has not been using her CPAP for more than a year.. States she plans to start using it again. States she will need to have the settings checked. She would like to have a sleep study here in [place] before she starts wearing this again.  States she feels like she might be taking too much medicine. States she does not have anyone to help her with her medications. She states she gets confused sometimes.  States she was diagnosed with type 2 diabetes approximately 10 years ago. She has been insulin dependent for at least 5 years. This morning she took Victoza 1.8, 25 units of Novolog. (states her blood sugar this morning was 51). She did eat but not much.  Her blood sugar here is 52. She is taking Levemir 55 units every evening. Also taking Januvia daily.  States she is having a lot of low blood sugars. States her previous PCP wanted her to start seeing an endocrinologist but she did not follow through with this.  She is aware that she has not been compliant with her treatment.   A1C 6.5% today   Complains of having nausea and abdominal pain for the past few weeks.  States she has been seeing a nephrologist in [place]. Dr. Adolm Joseph at [facility]. Would like to start seeing one locally. Has CKD.   Dr. Humphrey Rolls in [place] is her cardiologist. States she has a cardiac stent. Is not on an anticoagulant.   HTN- no concerns with medications. Does not check her blood pressure at home.  History of vitamin D deficiency. Is not currently taking vitamin D supplement.  History of anemia. Is taking daily  iron.   Social history: Lives alone.   Reviewed allergies, medications, past medical, surgical, family, and social history.   Review of Systems Review of Systems Constitutional: -fever, -chills, -sweats, -unexpected weight change,-fatigue Cardiology:  -chest pain, -palpitations, -edema Respiratory: -cough, -shortness of breath, -wheezing Gastroenterology: -abdominal pain, -nausea, -vomiting, -diarrhea, -constipation  Musculoskeletal: -arthralgias, -myalgias, -joint swelling, -back pain Ophthalmology: -vision changes Urology: -dysuria, -difficulty urinating, -hematuria, -urinary frequency, -urgency Neurology: -headache, -weakness, -tingling, -numbness        Objective:   Physical Exam  Constitutional: She is oriented to person, place, and time. She appears well-developed and well-nourished. No distress.  HENT:  Mouth/Throat: Oropharynx is clear and moist.  Eyes: Conjunctivae are normal. Pupils are equal, round, and reactive to light.  Neck: Normal range of motion. Neck supple. No JVD present. No thyromegaly present.  Cardiovascular: Normal rate, regular rhythm, normal heart sounds and intact distal pulses.  Exam reveals no gallop and no friction rub.   No murmur heard. Pulmonary/Chest: Effort normal and breath sounds normal.  Abdominal: Soft. Bowel sounds are normal. She exhibits no distension. There is no hepatosplenomegaly. There is no tenderness. There is no rebound, no guarding and no CVA tenderness.  Lymphadenopathy:    She has no cervical adenopathy.  Neurological: She is alert and oriented to person, place, and time. She has normal strength. She displays  no tremor. No cranial nerve deficit or sensory deficit. Gait normal.  Skin: Skin is warm and dry. No rash noted. She is not diaphoretic. No cyanosis. No pallor.  Psychiatric: She has a normal mood and affect. Her speech is normal and behavior is normal. Judgment and thought content normal. Cognition and memory are normal.    BP 118/62   Pulse 80   Wt 179 lb 9.6 oz (81.5 kg)   BMI 30.83 kg/m       Assessment & Plan:  Controlled type 2 diabetes mellitus with hypoglycemia, with long-term current use of insulin (HCC) - Plan: HgB A1c, POCT glucose (manual entry), CBC with Differential/Platelet, COMPLETE METABOLIC PANEL WITH GFR, TSH, POCT glucose (manual entry), Ambulatory referral to Endocrinology  Vitamin D deficiency - Plan: VITAMIN D 25 Hydroxy (Vit-D Deficiency, Fractures)  Hypertension, unspecified type - Plan: CBC with Differential/Platelet, COMPLETE METABOLIC PANEL WITH GFR  OSA (obstructive sleep apnea) - Plan: Home sleep test  Anemia, unspecified type - Plan: CBC with Differential/Platelet  Chronic kidney disease, unspecified CKD stage - Plan: COMPLETE METABOLIC PANEL WITH GFR  Encounter to establish care  Need for prophylactic vaccination and inoculation against influenza - Plan: Flu vaccine HIGH DOSE PF  Medication management - Plan: AMB Referral to [facility]  Her blood sugar was 52 in the office. Juice and granola bar given. Her blood sugar increased to 80 prior to leaving the office and she verbalized that she felt much better.  Discussed that her low blood sugar is related to taking 25 minutes of novolog when her fasting blood sugar at home was 51 per patient.   Discussed that I recommend she stop Januvia. Counseled on risk of low blood sugars. She will not take meal time insulin if her blood sugar is <130. Advised her to continue with strict blood sugar checks. She will continue on Levemir and Victoza. Plan to refer her to endocrinologist for help with her diabetes management.  Her blood pressure is within normal range. Continue on current medication regimen.  Plan to order a sleep study for further evaluation of OSA and CPAP.  CKD-- she has not followed up with her nephrologist in [place] and would like to be referred to one locally. Plan to get her records and make referral.   Will refer to [facility] for possible assistance with medications.  Will follow up pending labs regarding anemia and vitamin D level.  Flu shot given.  She will follow up in 2 weeks or sooner if needed. Spent at least 45 minutes with patient and at least 50% was in counseling and coordination of care.

## 2015-11-24 ENCOUNTER — Other Ambulatory Visit: Payer: Self-pay | Admitting: Family Medicine

## 2015-11-24 ENCOUNTER — Telehealth: Payer: Self-pay | Admitting: Family Medicine

## 2015-11-24 LAB — VITAMIN D 25 HYDROXY (VIT D DEFICIENCY, FRACTURES): Vit D, 25-Hydroxy: 22 ng/mL — ABNORMAL LOW (ref 30–100)

## 2015-11-24 MED ORDER — VITAMIN D (ERGOCALCIFEROL) 1.25 MG (50000 UNIT) PO CAPS: 50000.0000 [IU] | ORAL_CAPSULE | ORAL | 0 refills | Status: DC

## 2015-11-24 NOTE — Telephone Encounter (Signed)
Rcvd office notes, labs from [facility]

## 2015-11-27 ENCOUNTER — Other Ambulatory Visit: Payer: Self-pay

## 2015-11-27 ENCOUNTER — Ambulatory Visit: Payer: Commercial Managed Care - HMO | Admitting: Endocrinology

## 2015-11-27 NOTE — Addendum Note (Signed)
Addended by: Tobi Bastos on: 11/27/2015 06:32 PM   Modules accepted: Orders

## 2015-11-27 NOTE — Patient Outreach (Signed)
[facility]) Care Management  11/27/2015   Courtney Goodman Adan Jun 09, 1945 [id number]  Subjective:  I just moved to the [place] area. I need a podiatrist, I don't know of any  Objective:  Telephonic encounter  Current Medications:  Current Outpatient Prescriptions  Medication Sig Dispense Refill  . ACCU-CHEK AVIVA PLUS test strip Test 3-4 times a day    . ACCU-CHEK SOFTCLIX LANCETS lancets Test 3-4 times a day    . amLODipine-benazepril (LOTREL) 5-20 MG capsule Take 1 capsule by mouth daily.    Marland Kitchen aspirin EC 81 MG tablet Take 81 mg by mouth every morning.     . carvedilol (COREG) 6.25 MG tablet Take 6.25 mg by mouth 2 (two) times daily with a meal.    . fenofibrate (TRICOR) 145 MG tablet Take 1 tablet (145 mg total) by mouth daily. 30 tablet 11  . ferrous sulfate (IRON SUPPLEMENT) 325 (65 FE) MG tablet Take 325 mg by mouth every morning.     Marland Kitchen FLUoxetine (PROZAC) 20 MG tablet Take 1 tablet by mouth at bedtime.    . Insulin Detemir (LEVEMIR FLEXPEN) 100 UNIT/ML Pen Inject 55 Units into the skin daily at 10 pm. 15 mL 11  . liraglutide (VICTOZA) 18 MG/3ML SOPN Inject 1.8 mg into the skin every morning.    Marland Kitchen NOVOLOG MIX 70/30 FLEXPEN (70-30) 100 UNIT/ML FlexPen 25 Units 3 (three) times daily.    Marland Kitchen omeprazole (PRILOSEC) 40 MG capsule Take 40 mg by mouth daily.    . simvastatin (ZOCOR) 20 MG tablet Take 20 mg by mouth every evening.    . traMADol (ULTRAM) 50 MG tablet Take 50 mg by mouth 2 (two) times daily as needed for moderate pain or severe pain.     . Vitamin D, Ergocalciferol, (DRISDOL) 50000 units CAPS capsule Take 1 capsule (50,000 Units total) by mouth every 7 (seven) days. 8 capsule 0   No current facility-administered medications for this visit.     Functional Status:  In your present state of health, do you have any difficulty performing the following activities: 11/27/2015  Hearing? Y  Vision? Y  Difficulty concentrating or making decisions? Y  Walking  or climbing stairs? Y  Dressing or bathing? N  Doing errands, shopping? Y  Preparing Food and eating ? N  Using the Toilet? N  In the past six months, have you accidently leaked urine? Y  Do you have problems with loss of bowel control? N  Managing your Medications? N  Managing your Finances? N  Housekeeping or managing your Housekeeping? Y  Some recent data might be hidden    Fall/Depression Screening: PHQ 2/9 Scores 11/27/2015  PHQ - 2 Score 4  PHQ- 9 Score 10   Fall Risk  11/27/2015  Falls in the past year? Yes  Number falls in past yr: 2 or more  Injury with Fall? Yes  Risk Factor Category  High Fall Risk  Risk for fall due to : History of fall(s);Impaired balance/gait;Impaired mobility;Impaired vision;Medication side effect  Follow up Follow up appointment   [facility] [place] CM Care Plan Problem One   Flowsheet Row Most Recent Value  Care Plan Problem One  patient recently moved to [place]  Role Documenting the Problem One  Care Management [place] for Problem One  Active  [facility] Long Term Goal (31-90 days)  In the next 31 days, patient will meet with RNCM to assess her community resources  [facility] Long Term Goal Start Date  11/27/15  Interventions for Problem One Long Term Goal  Initial telehphone contact for community care coordination  [facility] CM Short Term Goal #1 (0-30 days)  In the next 14 days, patient will  meet with [facility] RNCM for assessment of needs.  [facility] CM Short Term Goal #1 Start Date  11/27/15  Interventions for Short Term Goal #1  Initial conversation with patient for assessment , scheduled home visit     Assessment:   Patient is new to the [facility]. Patient scored 10 on PHQ examination.    Plan:  Home visit in the next 14 days.

## 2015-11-28 ENCOUNTER — Other Ambulatory Visit: Payer: Self-pay | Admitting: Licensed Clinical Social Worker

## 2015-11-28 NOTE — Patient Outreach (Signed)
[facility]) Care Management  11/28/2015  Courtney Goodman 02-05-1945 [id number]   Assessment- CSW received new referral from [place] stating that patient is in need of mental health providers list as she has recently moved to [facility] from [place] and no longer has a mental health provider. CSW completed initial outreach and patient answered. CSW introduced self, reason for call and of [facility] social work services. HIPPA verifications provided. Patient reports "I want to focus on my diabetes right now. My diabetes is all out of whack and until I get that fixed I can't work on anything else." Patient shares that she does not wish to gain a new mental health provider at this time. She shares "I'm not worried about that right now. I have to focus on my health." Patient denies social work involvement. However, she is agreeable to CSW at least mailing out 8 pages of mental health providers that she can have to use once she is ready to find a new provider.   CSW will not open case due to patient refusing social work services.  Plan-CSW will update [facility] RNCM. CSW will send request to [place] Management Assistant to mail out requested mental health provider list for patients with Medicaid.  [name], BSW, MSW, [email or web address] Phone: [phone] Fax: [phone]

## 2015-11-30 ENCOUNTER — Encounter: Payer: Self-pay | Admitting: Endocrinology

## 2015-11-30 ENCOUNTER — Ambulatory Visit (INDEPENDENT_AMBULATORY_CARE_PROVIDER_SITE_OTHER): Payer: Commercial Managed Care - HMO | Admitting: Endocrinology

## 2015-11-30 ENCOUNTER — Telehealth: Payer: Self-pay | Admitting: Family Medicine

## 2015-11-30 DIAGNOSIS — E119 Type 2 diabetes mellitus without complications: Secondary | ICD-10-CM | POA: Insufficient documentation

## 2015-11-30 DIAGNOSIS — N183 Chronic kidney disease, stage 3 unspecified: Secondary | ICD-10-CM

## 2015-11-30 DIAGNOSIS — Z794 Long term (current) use of insulin: Secondary | ICD-10-CM | POA: Diagnosis not present

## 2015-11-30 DIAGNOSIS — E1122 Type 2 diabetes mellitus with diabetic chronic kidney disease: Secondary | ICD-10-CM

## 2015-11-30 NOTE — Progress Notes (Signed)
Subjective:    Patient ID: Courtney Goodman, female    DOB: [birth year], 70 y.o.   MRN: [id number]  HPI pt states DM was dx'ed in 2005; she has mild if any neuropathy of the lower extremities; she has associated renal insufficiency and CAD; she has been on insulin since 2009; pt says her diet and exercise are "fair." she has never had GDM, pancreatitis, or DKA.  She has had 1 episode of severe hypoglycemia (2012).  She takes novolog 70/30, 25 units QAM and levemir 45 units QPM.  Insulin was reduced last week.  Since it was reduced, cbg's vary from 77-208. It is lowest fasting.   Past Medical History:  Diagnosis Date  . Asthma   . COPD (chronic obstructive pulmonary disease) ([place])   . Diabetes mellitus without complication ([place])   . Dysrhythmia   . Hypertension   . Shortness of breath dyspnea     Past Surgical History:  Procedure Laterality Date  . CORONARY ANGIOPLASTY      Social History   Social History  . Marital status: Divorced    Spouse name: N/A  . Number of children: N/A  . Years of education: N/A   Occupational History  . Not on file.   Social History Main Topics  . Smoking status: Former Research scientist (life sciences)  . Smokeless tobacco: Never Used  . Alcohol use No  . Drug use: Unknown  . Sexual activity: Not on file   Other Topics Concern  . Not on file   Social History Narrative  . No narrative on file    Current Outpatient Prescriptions on File Prior to Visit  Medication Sig Dispense Refill  . ACCU-CHEK AVIVA PLUS test strip Test 3-4 times a day    . ACCU-CHEK SOFTCLIX LANCETS lancets Test 3-4 times a day    . amLODipine-benazepril (LOTREL) 5-20 MG capsule Take 1 capsule by mouth daily.    Marland Kitchen aspirin EC 81 MG tablet Take 81 mg by mouth every morning.     . carvedilol (COREG) 6.25 MG tablet Take 6.25 mg by mouth 2 (two) times daily with a meal.    . fenofibrate (TRICOR) 145 MG tablet Take 1 tablet (145 mg total) by mouth daily. 30 tablet 11  . ferrous sulfate (IRON  SUPPLEMENT) 325 (65 FE) MG tablet Take 325 mg by mouth every morning.     Marland Kitchen FLUoxetine (PROZAC) 20 MG tablet Take 1 tablet by mouth at bedtime.    . Insulin Detemir (LEVEMIR FLEXPEN) 100 UNIT/ML Pen Inject 55 Units into the skin daily at 10 pm. (Patient taking differently: Inject 45 Units into the skin daily at 10 pm. ) 15 mL 11  . liraglutide (VICTOZA) 18 MG/3ML SOPN Inject 1.8 mg into the skin every morning.    Marland Kitchen NOVOLOG MIX 70/30 FLEXPEN (70-30) 100 UNIT/ML FlexPen 25 Units 3 (three) times daily.    Marland Kitchen omeprazole (PRILOSEC) 40 MG capsule Take 40 mg by mouth daily.    . simvastatin (ZOCOR) 20 MG tablet Take 20 mg by mouth every evening.    . traMADol (ULTRAM) 50 MG tablet Take 50 mg by mouth 2 (two) times daily as needed for moderate pain or severe pain.     . Vitamin D, Ergocalciferol, (DRISDOL) 50000 units CAPS capsule Take 1 capsule (50,000 Units total) by mouth every 7 (seven) days. (Patient not taking: Reported on 11/30/2015) 8 capsule 0   No current facility-administered medications on file prior to visit.     No Known  Allergies  Family History  Problem Relation Age of Onset  . Diabetes Mother     BP 130/68   Pulse 77   Ht 5\' 4"  (1.626 m)   Wt 177 lb (80.3 kg)   SpO2 99%   BMI 30.38 kg/m    Review of Systems denies blurry vision, headache, chest pain, sob, n/v, urinary frequency, muscle cramps, excessive diaphoresis, memory loss, and rhinorrhea.  She has lost 20 lbs x 10 years.  She has cold intolerance and easy bruising.     Objective:   Physical Exam VS: see vs page GEN: no distress HEAD: head: no deformity eyes: no periorbital swelling, no proptosis external nose and ears are normal mouth: no lesion seen NECK: supple, thyroid is not enlarged CHEST WALL: no deformity LUNGS: clear to auscultation CV: reg rate and rhythm, no murmur ABD: abdomen is soft, nontender.  no hepatosplenomegaly.  not distended.  no hernia MUSCULOSKELETAL: muscle bulk and strength are  grossly normal.  no obvious joint swelling.  gait is normal and steady EXTEMITIES: no deformity.  no ulcer on the feet.  feet are of normal color and temp.  no edema.  There is bilateral onychomycosis of the toenails.  PULSES: dorsalis pedis intact bilat.  no carotid bruit NEURO:  cn 2-12 grossly intact.   readily moves all 4's.  sensation is intact to touch on the feet SKIN:  Normal texture and temperature.  No rash or suspicious lesion is visible.   NODES:  None palpable at the neck PSYCH: alert, well-oriented.  Does not appear anxious nor depressed.   Lab Results  Component Value Date   HGBA1C 6.5% 11/23/2015    I have reviewed outside records, and summarized: Pt was seen last week, and insulin was reduced, due to overcontrol of DM.      Assessment & Plan:  Insulin-requiring type 2 DM: with renal insufficiency, new to me.  Overcontrolled.  We'll check fructosamine after a few weeks.  Renal failure: this increases the risk for hypoglycemia.   Patient is advised the following: Patient Instructions  good diet and exercise significantly improve the control of your diabetes.  please let me know if you wish to be referred to a dietician.  high blood sugar is very risky to your health.  you should see an eye doctor and dentist every year.  It is very important to get all recommended vaccinations.  Controlling your blood pressure and cholesterol drastically reduces the damage diabetes does to your body.  Those who smoke should quit.  Please discuss these with your doctor.  check your blood sugar twice a day.  vary the time of day when you check, between before the 3 meals, and at bedtime.  also check if you have symptoms of your blood sugar being too high or too low.  please keep a record of the readings and bring it to your next appointment here (or you can bring the meter itself).  You can write it on any piece of paper.  please call us sooner if your blood sugar goes below 70, or if you have  a lot of readings over 200.   Kidney patients tend to have trouble with low blood sugar, so please continue to carefully check.   Please come in to the lab in 1-2 weeks, for a blood test.  If that test is too low, we'll reduce the levemir some more.  Please come back for a follow-up appointment in 3 months.

## 2015-11-30 NOTE — Telephone Encounter (Signed)
Recvd office notes from [facility]

## 2015-11-30 NOTE — Patient Instructions (Addendum)
good diet and exercise significantly improve the control of your diabetes.  please let me know if you wish to be referred to a dietician.  high blood sugar is very risky to your health.  you should see an eye doctor and dentist every year.  It is very important to get all recommended vaccinations.  Controlling your blood pressure and cholesterol drastically reduces the damage diabetes does to your body.  Those who smoke should quit.  Please discuss these with your doctor.  check your blood sugar twice a day.  vary the time of day when you check, between before the 3 meals, and at bedtime.  also check if you have symptoms of your blood sugar being too high or too low.  please keep a record of the readings and bring it to your next appointment here (or you can bring the meter itself).  You can write it on any piece of paper.  please call us sooner if your blood sugar goes below 70, or if you have a lot of readings over 200.   Kidney patients tend to have trouble with low blood sugar, so please continue to carefully check.   Please come in to the lab in 1-2 weeks, for a blood test.  If that test is too low, we'll reduce the levemir some more.  Please come back for a follow-up appointment in 3 months.

## 2015-12-01 ENCOUNTER — Other Ambulatory Visit: Payer: Self-pay

## 2015-12-01 NOTE — Patient Outreach (Signed)
[facility]) Care Management  12/01/2015  Lumen Brinlee Asato [birth year] [id number]   Request received from [name], LCSW to mail patient mental health resources. Information mailed today, 12/01/15  Josepha Pigg, [place] Management Assistant

## 2015-12-02 NOTE — Patient Outreach (Signed)
[facility]) Care Management   12/02/2015  Courtney Goodman 04-Aug-1945 [id number]  Courtney Goodman is an 70 y.o. female  Subjective:  I moved up here recently to get away from my grandson. My grandson was worrying me.   I have family and friends here but they work  Objective:   ROS Well nourished, very pleasant elderly lady. Apartment is 2 bedroom, very neat, organized.   Physical Exam ROS  Encounter Medications:   Outpatient Encounter Prescriptions as of 12/01/2015  Medication Sig Note  . ACCU-CHEK AVIVA PLUS test strip Test 3-4 times a day 11/23/2015: Received from: External Pharmacy  . ACCU-CHEK SOFTCLIX LANCETS lancets Test 3-4 times a day 11/23/2015: Received from: External Pharmacy  . amLODipine-benazepril (LOTREL) 5-20 MG capsule Take 1 capsule by mouth daily. 11/23/2015: Received from: External Pharmacy  . aspirin EC 81 MG tablet Take 81 mg by mouth every morning.    . carvedilol (COREG) 6.25 MG tablet Take 6.25 mg by mouth 2 (two) times daily with a meal.   . fenofibrate (TRICOR) 145 MG tablet Take 1 tablet (145 mg total) by mouth daily.   . ferrous sulfate (IRON SUPPLEMENT) 325 (65 FE) MG tablet Take 325 mg by mouth every morning.    Marland Kitchen FLUoxetine (PROZAC) 20 MG tablet Take 1 tablet by mouth at bedtime. 11/23/2015: Received from: External Pharmacy  . Insulin Detemir (LEVEMIR FLEXPEN) 100 UNIT/ML Pen Inject 55 Units into the skin daily at 10 pm. (Patient taking differently: Inject 45 Units into the skin daily at 10 pm. )   . liraglutide (VICTOZA) 18 MG/3ML SOPN Inject 1.8 mg into the skin every morning.   Marland Kitchen NOVOLOG MIX 70/30 FLEXPEN (70-30) 100 UNIT/ML FlexPen 25 Units 3 (three) times daily. 11/23/2015: Received from: External Pharmacy  . omeprazole (PRILOSEC) 40 MG capsule Take 40 mg by mouth daily.   . simvastatin (ZOCOR) 20 MG tablet Take 20 mg by mouth every evening.   . traMADol (ULTRAM) 50 MG tablet Take 50 mg by mouth 2 (two) times daily as needed  for moderate pain or severe pain.    . Vitamin D, Ergocalciferol, (DRISDOL) 50000 units CAPS capsule Take 1 capsule (50,000 Units total) by mouth every 7 (seven) days. (Patient not taking: Reported on 11/30/2015)    No facility-administered encounter medications on file as of 12/01/2015.     Functional Status:   In your present state of health, do you have any difficulty performing the following activities: 11/27/2015  Hearing? Y  Vision? Y  Difficulty concentrating or making decisions? Y  Walking or climbing stairs? Y  Dressing or bathing? N  Doing errands, shopping? Y  Preparing Food and eating ? N  Using the Toilet? N  In the past six months, have you accidently leaked urine? Y  Do you have problems with loss of bowel control? N  Managing your Medications? N  Managing your Finances? N  Housekeeping or managing your Housekeeping? Y  Some recent data might be hidden    Fall/Depression Screening:    PHQ 2/9 Scores 11/27/2015  PHQ - 2 Score 4  PHQ- 9 Score 10   Fall Risk  11/27/2015  Falls in the past year? Yes  Number falls in past yr: 2 or more  Injury with Fall? Yes  Risk Factor Category  High Fall Risk  Risk for fall due to : History of fall(s);Impaired balance/gait;Impaired mobility;Impaired vision;Medication side effect  Follow up Follow up appointment   Assessment:   Patient has  knowledge deficit related to heart failure, diabetes. EMMI videos on Heart Failure and Diabetes reviewed and discussed.   Patient has history of falls with injury, Emmi Vide viewed and discussed. Home safety assessment completed.  This RNCM suggested patient wear shoes with grip bottoms at all times for safety due to hardwood floors being slippery.  Plan:  Home visit within the next 30 days for additional chronic disease education, assessment of needs.

## 2015-12-06 ENCOUNTER — Other Ambulatory Visit: Payer: Commercial Managed Care - HMO

## 2015-12-07 ENCOUNTER — Encounter: Payer: Self-pay | Admitting: Internal Medicine

## 2015-12-07 ENCOUNTER — Ambulatory Visit (INDEPENDENT_AMBULATORY_CARE_PROVIDER_SITE_OTHER): Payer: Commercial Managed Care - HMO | Admitting: Family Medicine

## 2015-12-07 ENCOUNTER — Encounter: Payer: Self-pay | Admitting: Family Medicine

## 2015-12-07 VITALS — BP 112/70 | HR 76 | Wt 179.2 lb

## 2015-12-07 DIAGNOSIS — I10 Essential (primary) hypertension: Secondary | ICD-10-CM

## 2015-12-07 DIAGNOSIS — G4733 Obstructive sleep apnea (adult) (pediatric): Secondary | ICD-10-CM

## 2015-12-07 DIAGNOSIS — N189 Chronic kidney disease, unspecified: Secondary | ICD-10-CM | POA: Diagnosis not present

## 2015-12-07 DIAGNOSIS — Z794 Long term (current) use of insulin: Secondary | ICD-10-CM

## 2015-12-07 DIAGNOSIS — E1121 Type 2 diabetes mellitus with diabetic nephropathy: Secondary | ICD-10-CM | POA: Diagnosis not present

## 2015-12-07 DIAGNOSIS — L609 Nail disorder, unspecified: Secondary | ICD-10-CM | POA: Diagnosis not present

## 2015-12-07 NOTE — Progress Notes (Signed)
Subjective:    Patient ID: Courtney Goodman, female    DOB: April 25, 1945, 70 y.o.   MRN: [id number]  HPI Chief Complaint  Patient presents with  . follow-up    follow-up   She is here for follow-up on multiple chronic health conditions. She did follow-up with the endocrinologist and had a recent visit with them. They are now managing her diabetes. We adjusted her diabetes medication at her previous visit and she denies having any more lows, no blood sugars less than 90, and states she feels much better than her previous visit. She denies fever, chills, chest pain, palpitations, shortness of breath, abdominal pain, nausea, vomiting, diarrhea.  At her last visit she expressed that she felt overwhelmed regarding all of her medications and was having a hard time keeping them straight. She is having a home health nurse come out and get her medications straightened out. She has another visit scheduled today.   She is requesting to see a podiatrist to have her nails clipped. She denies numbness, tingling, weakness to her bilateral lower extremities.  She is also following up regarding abnormal labs and is aware that her kidney function is abnormal and reports a history of this. She has a history of chronic kidney disease and was under the care of a nephrologist in [place]. She is requesting a referral to a neurologist here in [place].  She reports a history of OSA and has not used a CPAP in several months, states her equipment is old. A referral was made for sleep study and she reports that she has not heard from them to schedule this as of yet. ?COPD history- years ago she was taking medication and on oxygen nightly but has not needed this in years. Denies having any issues with this over the past  Past Medical History:  Diagnosis Date  . Asthma   . COPD (chronic obstructive pulmonary disease) ([place])   . Diabetes mellitus without complication ([place])   . Dysrhythmia   . Hypertension   .  Shortness of breath dyspnea    Past Surgical History:  Procedure Laterality Date  . CORONARY ANGIOPLASTY        Review of Systems Pertinent positives and negatives in the history of present illness.     Objective:   Physical Exam BP 112/70   Pulse 76   Wt 179 lb 3.2 oz (81.3 kg)   BMI 30.76 kg/m   Alert and oriented and in no acute distress. Not otherwise examined.     Assessment & Plan:  Essential hypertension - Plan: Ambulatory referral to Nephrology  OSA (obstructive sleep apnea)  Chronic kidney disease, unspecified CKD stage - Plan: Ambulatory referral to Nephrology  Controlled type 2 diabetes mellitus with diabetic nephropathy, with long-term current use of insulin ([place]) - Plan: Ambulatory referral to Nephrology, Ambulatory referral to Podiatry  Nail abnormalities - Plan: Ambulatory referral to Podiatry  Overall she appears to be doing much better than her previous visit.  Discussed that her blood pressure is within goal range today. Continue on current medications. I also recommend that she continue watching her sodium intake.  Records received and reviewed from [facility] in [place]. She was under the care of Dr. Holley Raring. Per patient request, will refer her to [facility] for management of CKD. She appears to be at least stage 3 CKD.  A referral was made for sleep study. She will let me know if she has not heard from them in the next  week.  She will continue following up with the endocrinologist for diabetes management. She appears to be doing much better than her previous visit and is no longer having low blood sugars.  A home health nurse will be visiting her later today and helping her with medication management.  Plan to refer her to podiatry for nail care per patient request.  It appears that she is overdue for an annual wellness visit and will have her schedule this in the next few weeks.

## 2015-12-07 NOTE — Patient Instructions (Signed)
You will receive a phone call from [facility] to schedule an appointment. This may take a couple of weeks before you hear from them. You will also receive a phone call from the podiatrist's to schedule an appointment for your toenails.  Let me know if you have not heard from the sleep Center in the next week.

## 2015-12-14 ENCOUNTER — Other Ambulatory Visit: Payer: Self-pay

## 2015-12-14 NOTE — Patient Outreach (Signed)
[facility]) Care Management   12/14/2015  Courtney Goodman 01-16-1946 [id number]  Courtney Goodman is an 70 y.o. female  Subjective:  I have made all the appointments we discussed the last time.    Objective:   ROS  Physical Exam  Encounter Medications:   Outpatient Encounter Prescriptions as of 12/14/2015  Medication Sig  . ACCU-CHEK AVIVA PLUS test strip Test 3-4 times a day  . ACCU-CHEK SOFTCLIX LANCETS lancets Test 3-4 times a day  . amLODipine-benazepril (LOTREL) 5-20 MG capsule Take 1 capsule by mouth daily.  Marland Kitchen aspirin EC 81 MG tablet Take 81 mg by mouth every morning.   . carvedilol (COREG) 6.25 MG tablet Take 6.25 mg by mouth 2 (two) times daily with a meal.  . fenofibrate (TRICOR) 145 MG tablet Take 1 tablet (145 mg total) by mouth daily.  . ferrous sulfate (IRON SUPPLEMENT) 325 (65 FE) MG tablet Take 325 mg by mouth every morning.   Marland Kitchen FLUoxetine (PROZAC) 20 MG tablet Take 1 tablet by mouth at bedtime.  . Insulin Detemir (LEVEMIR FLEXPEN) 100 UNIT/ML Pen Inject 55 Units into the skin daily at 10 pm. (Patient taking differently: Inject 45 Units into the skin daily at 10 pm. )  . liraglutide (VICTOZA) 18 MG/3ML SOPN Inject 1.8 mg into the skin every morning.  Marland Kitchen NOVOLOG MIX 70/30 FLEXPEN (70-30) 100 UNIT/ML FlexPen 25 Units 3 (three) times daily.  Marland Kitchen omeprazole (PRILOSEC) 40 MG capsule Take 40 mg by mouth daily.  . simvastatin (ZOCOR) 20 MG tablet Take 20 mg by mouth every evening.  . traMADol (ULTRAM) 50 MG tablet Take 50 mg by mouth 2 (two) times daily as needed for moderate pain or severe pain.   . Vitamin D, Ergocalciferol, (DRISDOL) 50000 units CAPS capsule Take 1 capsule (50,000 Units total) by mouth every 7 (seven) days.   No facility-administered encounter medications on file as of 12/14/2015.     Functional Status:   In your present state of health, do you have any difficulty performing the following activities: 11/27/2015  Hearing? Y  Vision?  Y  Difficulty concentrating or making decisions? Y  Walking or climbing stairs? Y  Dressing or bathing? N  Doing errands, shopping? Y  Preparing Food and eating ? N  Using the Toilet? N  In the past six months, have you accidently leaked urine? Y  Do you have problems with loss of bowel control? N  Managing your Medications? N  Managing your Finances? N  Housekeeping or managing your Housekeeping? Y  Some recent data might be hidden    Fall/Depression Screening:    PHQ 2/9 Scores 11/27/2015  PHQ - 2 Score 4  PHQ- 9 Score 10    Assessment:   Patient has made progress in reaching her case management goals. Patient has made appointments with [facility], Podiatrist and primary care provider. Patient weighs daily, records.   Patient also measures and records her glucose levels daily.  Patient and this RNCM reviewed and updated her case management goals.  Plan:   Make contact with patient in the next 31 days to assess her progress in reaching her case management goals.

## 2015-12-18 ENCOUNTER — Other Ambulatory Visit: Payer: Self-pay | Admitting: Family Medicine

## 2015-12-18 ENCOUNTER — Telehealth: Payer: Self-pay

## 2015-12-18 DIAGNOSIS — G4733 Obstructive sleep apnea (adult) (pediatric): Secondary | ICD-10-CM

## 2015-12-18 NOTE — Telephone Encounter (Signed)
[facility] referral done through [facility].   Approval #- [id number]

## 2015-12-18 NOTE — Telephone Encounter (Signed)
Pt needs authorization for [facility]  12/27/2015  Erling Cruz NPI: [id number] Dx Code: [phone] Suanne Marker if any questions

## 2015-12-27 DIAGNOSIS — D649 Anemia, unspecified: Secondary | ICD-10-CM | POA: Diagnosis not present

## 2015-12-27 DIAGNOSIS — Z6830 Body mass index (BMI) 30.0-30.9, adult: Secondary | ICD-10-CM | POA: Diagnosis not present

## 2015-12-27 DIAGNOSIS — N183 Chronic kidney disease, stage 3 (moderate): Secondary | ICD-10-CM | POA: Diagnosis not present

## 2015-12-27 DIAGNOSIS — E559 Vitamin D deficiency, unspecified: Secondary | ICD-10-CM | POA: Diagnosis not present

## 2016-01-01 ENCOUNTER — Ambulatory Visit (INDEPENDENT_AMBULATORY_CARE_PROVIDER_SITE_OTHER): Payer: Commercial Managed Care - HMO | Admitting: Podiatry

## 2016-01-01 ENCOUNTER — Encounter: Payer: Self-pay | Admitting: Podiatry

## 2016-01-01 VITALS — BP 134/66 | HR 81

## 2016-01-01 DIAGNOSIS — L608 Other nail disorders: Secondary | ICD-10-CM | POA: Diagnosis not present

## 2016-01-01 DIAGNOSIS — L603 Nail dystrophy: Secondary | ICD-10-CM | POA: Diagnosis not present

## 2016-01-01 DIAGNOSIS — B351 Tinea unguium: Secondary | ICD-10-CM | POA: Diagnosis not present

## 2016-01-01 DIAGNOSIS — M79609 Pain in unspecified limb: Secondary | ICD-10-CM

## 2016-01-01 DIAGNOSIS — E0843 Diabetes mellitus due to underlying condition with diabetic autonomic (poly)neuropathy: Secondary | ICD-10-CM

## 2016-01-01 NOTE — Progress Notes (Signed)
SUBJECTIVE Patient with a history of diabetes mellitus presents to office today complaining of elongated, thickened nails. Pain while ambulating in shoes. Patient is unable to trim their own nails.   No Known Allergies  OBJECTIVE General Patient is awake, alert, and oriented x 3 and in no acute distress. Derm Skin is dry and supple bilateral. Negative open lesions or macerations. Remaining integument unremarkable. Nails are tender, long, thickened and dystrophic with subungual debris, consistent with onychomycosis, 1-5 bilateral. No signs of infection noted. Vasc  DP and PT pedal pulses palpable bilaterally. Temperature gradient within normal limits.  Neuro Epicritic and protective threshold sensation diminished bilaterally.  Musculoskeletal Exam No symptomatic pedal deformities noted bilateral. Muscular strength within normal limits.  ASSESSMENT 1. Diabetes Mellitus w/ peripheral neuropathy 2. Onychomycosis of nail due to dermatophyte bilateral 3. Pain in foot bilateral  PLAN OF CARE 1. Patient evaluated today. 2. Instructed to maintain good pedal hygiene and foot care. Stressed importance of controlling blood sugar.  3. Mechanical debridement of nails 1-5 bilaterally performed using a nail nipper. Filed with dremel without incident.  4. Return to clinic in 3 mos.    Edrick Kins, DPM

## 2016-01-04 ENCOUNTER — Other Ambulatory Visit: Payer: Self-pay | Admitting: Family Medicine

## 2016-01-04 ENCOUNTER — Other Ambulatory Visit: Payer: Self-pay

## 2016-01-05 NOTE — Patient Outreach (Signed)
[facility]) Care Management   01/04/2016   Courtney Goodman 12-11-1945 [id number]  Courtney Goodman is an 70 y.o. female  Subjective:  I have been to the foot doctor. I am going to the eye doctor. I have also been to my diabetes doctor  Objective:   ROS  Well dressed, elderly, AA Woman, nice smile, very pleasant.   Physical Exam  ROS  Encounter Medications:   Outpatient Encounter Prescriptions as of 01/04/2016  Medication Sig  . ACCU-CHEK AVIVA PLUS test strip Test 3-4 times a day  . ACCU-CHEK SOFTCLIX LANCETS lancets Test 3-4 times a day  . amLODipine-benazepril (LOTREL) 5-20 MG capsule Take 1 capsule by mouth daily.  Marland Kitchen aspirin EC 81 MG tablet Take 81 mg by mouth every morning.   . carvedilol (COREG) 6.25 MG tablet Take 6.25 mg by mouth 2 (two) times daily with a meal.  . fenofibrate (TRICOR) 145 MG tablet Take 1 tablet (145 mg total) by mouth daily.  . ferrous sulfate (IRON SUPPLEMENT) 325 (65 FE) MG tablet Take 325 mg by mouth every morning.   Marland Kitchen FLUoxetine (PROZAC) 20 MG tablet Take 1 tablet by mouth at bedtime.  . Insulin Detemir (LEVEMIR FLEXPEN) 100 UNIT/ML Pen Inject 55 Units into the skin daily at 10 pm. (Patient taking differently: Inject 45 Units into the skin daily at 10 pm. )  . liraglutide (VICTOZA) 18 MG/3ML SOPN Inject 1.8 mg into the skin every morning.  Marland Kitchen NOVOLOG MIX 70/30 FLEXPEN (70-30) 100 UNIT/ML FlexPen 25 Units 3 (three) times daily.  Marland Kitchen omeprazole (PRILOSEC) 40 MG capsule Take 40 mg by mouth daily.  . simvastatin (ZOCOR) 20 MG tablet Take 20 mg by mouth every evening.  . traMADol (ULTRAM) 50 MG tablet Take 50 mg by mouth 2 (two) times daily as needed for moderate pain or severe pain.   . Vitamin D, Ergocalciferol, (DRISDOL) 50000 units CAPS capsule Take 1 capsule (50,000 Units total) by mouth every 7 (seven) days.   No facility-administered encounter medications on file as of 01/04/2016.     Functional Status:   In your present state  of health, do you have any difficulty performing the following activities: 11/27/2015  Hearing? Y  Vision? Y  Difficulty concentrating or making decisions? Y  Walking or climbing stairs? Y  Dressing or bathing? N  Doing errands, shopping? Y  Preparing Food and eating ? N  Using the Toilet? N  In the past six months, have you accidently leaked urine? Y  Do you have problems with loss of bowel control? N  Managing your Medications? N  Managing your Finances? N  Housekeeping or managing your Housekeeping? Y  Some recent data might be hidden    Fall/Depression Screening:    PHQ 2/9 Scores 11/27/2015  PHQ - 2 Score 4  PHQ- 9 Score 10    Assessment:   Patient has become established with an area podiatrist, optometrist, and primary care phyisican. Patient monitors her blood glucose levels 2 times per day. Patient reports compliance with monitoring her feet daily, states she has not noted any changes. Patient reports she has a very solid social circle which inclues her daughters, grandsons  Plan:  Home visit in 2018 for assessment of community care coordination

## 2016-01-20 LAB — HM MAMMOGRAPHY

## 2016-02-07 ENCOUNTER — Ambulatory Visit: Payer: Self-pay

## 2016-02-16 ENCOUNTER — Other Ambulatory Visit: Payer: Self-pay | Admitting: Family Medicine

## 2016-02-16 NOTE — Telephone Encounter (Signed)
Pt notified rx called to pharmacy and then she will be due for OV. Victorino December

## 2016-02-16 NOTE — Telephone Encounter (Signed)
Ok to give her 90 days and then I will need her to come in for a visit. She will be due for a medicare wellness visit

## 2016-02-22 ENCOUNTER — Ambulatory Visit (HOSPITAL_BASED_OUTPATIENT_CLINIC_OR_DEPARTMENT_OTHER): Payer: Medicare HMO | Attending: Family Medicine | Admitting: Internal Medicine

## 2016-02-22 VITALS — Ht 64.0 in | Wt 176.0 lb

## 2016-02-22 DIAGNOSIS — R5383 Other fatigue: Secondary | ICD-10-CM | POA: Insufficient documentation

## 2016-02-22 DIAGNOSIS — R0683 Snoring: Secondary | ICD-10-CM | POA: Insufficient documentation

## 2016-02-22 DIAGNOSIS — G4752 REM sleep behavior disorder: Secondary | ICD-10-CM | POA: Insufficient documentation

## 2016-02-22 DIAGNOSIS — G4733 Obstructive sleep apnea (adult) (pediatric): Secondary | ICD-10-CM

## 2016-02-22 DIAGNOSIS — Z79899 Other long term (current) drug therapy: Secondary | ICD-10-CM | POA: Insufficient documentation

## 2016-02-22 DIAGNOSIS — I10 Essential (primary) hypertension: Secondary | ICD-10-CM | POA: Insufficient documentation

## 2016-02-22 DIAGNOSIS — E119 Type 2 diabetes mellitus without complications: Secondary | ICD-10-CM | POA: Diagnosis not present

## 2016-03-01 ENCOUNTER — Other Ambulatory Visit: Payer: Self-pay | Admitting: Family Medicine

## 2016-03-02 DIAGNOSIS — G4752 REM sleep behavior disorder: Secondary | ICD-10-CM

## 2016-03-02 NOTE — Procedures (Signed)
   Patient Name: Courtney Goodman, Courtney Goodman Date: 02/22/2016 Gender: Female D.O.B: 07-03-1945 Age (years): 30 Referring Provider: [name] NP Height (inches): 33 Interpreting Physician: Baird Lyons MD, ABSM Weight (lbs): 176 RPSGT: Baxter Flattery BMI: 30 MRN: [id number] Neck Size: 15.00 CLINICAL INFORMATION Sleep Study Type: NPSG  Indication for sleep study: Diabetes, Fatigue, Hypertension, OSA, Snoring, Witnessed Apneas  Epworth Sleepiness Score: 4  SLEEP STUDY TECHNIQUE As per the AASM Manual for the Scoring of Sleep and Associated Events v2.3 (April 2016) with a hypopnea requiring 4% desaturations.  The channels recorded and monitored were frontal, central and occipital EEG, electrooculogram (EOG), submentalis EMG (chin), nasal and oral airflow, thoracic and abdominal wall motion, anterior tibialis EMG, snore microphone, electrocardiogram, and pulse oximetry.  MEDICATIONS Medications self-administered by patient taken the night of the study : OMEPRAZOLE, FLUOXETINE, SIMVASTATIN  SLEEP ARCHITECTURE The study was initiated at 11:04:32 PM and ended at 5:26:50 AM.  Sleep onset time was 17.1 minutes and the sleep efficiency was 68.5%. The total sleep time was 261.7 minutes.  Stage REM latency was 267.5 minutes.  The patient spent 7.45% of the night in stage N1 sleep, 76.12% in stage N2 sleep, 0.00% in stage N3 and 16.43% in REM.  Alpha intrusion was absent.  Supine sleep was 61.97%.  RESPIRATORY PARAMETERS The overall apnea/hypopnea index (AHI) was 0.7 per hour. There were 1 total apneas, including 1 obstructive, 0 central and 0 mixed apneas. There were 2 hypopneas and 0 RERAs.  The AHI during Stage REM sleep was 2.8 per hour.  AHI while supine was 0.7 per hour.  The mean oxygen saturation was 94.30%. The minimum SpO2 during sleep was 89.00%.  Moderate snoring was noted during this study.  CARDIAC DATA The 2 lead EKG demonstrated sinus rhythm. The mean heart  rate was 67.68 beats per minute. Other EKG findings include: None.  LEG MOVEMENT DATA The total PLMS were 4 with a resulting PLMS index of 0.92. Associated arousal with leg movement index was 0.0 .  IMPRESSIONS - No significant obstructive sleep apnea occurred during this study (AHI = 0.7/h). - No significant central sleep apnea occurred during this study (CAI = 0.0/h). - The patient had minimal or no oxygen desaturation during the study (Min O2 = 89.00%) - The patient snored with Moderate snoring volume. - No cardiac abnormalities were noted during this study. - Clinically significant periodic limb movements did not occur during sleep.  - Sleep-talking and limb movement were noted during REM. Seizure activity was not seen.  DIAGNOSIS - Primary Snoring (786.09 [R06.83 ICD-10]) - REM Behavior Disorder (327.42 [G47.52 ICD-10])  RECOMMENDATIONS - Consider specific therapy for REM Behavior Disorder if appropriate. - Avoid alcohol, sedatives and other CNS depressants that may worsen sleep apnea and disrupt normal sleep architecture. - Sleep hygiene should be reviewed to assess factors that may improve sleep quality. - Weight management and regular exercise should be initiated or continued if appropriate.  [Electronically signed] 03/02/2016 10:57 AM  Baird Lyons MD, ABSM Diplomate, American Board of Sleep Medicine   NPI: [id number]  [place], American Board of Sleep Medicine  ELECTRONICALLY SIGNED ON:  03/02/2016, 10:57 AM [place] PH: [phone]   FX: [phone] [place]

## 2016-03-06 ENCOUNTER — Ambulatory Visit: Payer: Commercial Managed Care - HMO | Admitting: Endocrinology

## 2016-03-12 ENCOUNTER — Encounter: Payer: Self-pay | Admitting: Family Medicine

## 2016-03-12 ENCOUNTER — Ambulatory Visit (INDEPENDENT_AMBULATORY_CARE_PROVIDER_SITE_OTHER): Payer: Medicare HMO | Admitting: Family Medicine

## 2016-03-12 VITALS — BP 122/60 | HR 73 | Resp 16 | Wt 173.8 lb

## 2016-03-12 DIAGNOSIS — R5383 Other fatigue: Secondary | ICD-10-CM

## 2016-03-12 DIAGNOSIS — D638 Anemia in other chronic diseases classified elsewhere: Secondary | ICD-10-CM | POA: Diagnosis not present

## 2016-03-12 DIAGNOSIS — E785 Hyperlipidemia, unspecified: Secondary | ICD-10-CM | POA: Diagnosis not present

## 2016-03-12 DIAGNOSIS — E1169 Type 2 diabetes mellitus with other specified complication: Secondary | ICD-10-CM

## 2016-03-12 DIAGNOSIS — R441 Visual hallucinations: Secondary | ICD-10-CM | POA: Diagnosis not present

## 2016-03-12 DIAGNOSIS — E559 Vitamin D deficiency, unspecified: Secondary | ICD-10-CM | POA: Diagnosis not present

## 2016-03-12 DIAGNOSIS — F329 Major depressive disorder, single episode, unspecified: Secondary | ICD-10-CM

## 2016-03-12 DIAGNOSIS — N189 Chronic kidney disease, unspecified: Secondary | ICD-10-CM | POA: Diagnosis not present

## 2016-03-12 DIAGNOSIS — F32A Depression, unspecified: Secondary | ICD-10-CM

## 2016-03-12 DIAGNOSIS — G479 Sleep disorder, unspecified: Secondary | ICD-10-CM

## 2016-03-12 LAB — POCT URINALYSIS DIPSTICK
Bilirubin, UA: NEGATIVE
Blood, UA: NEGATIVE
Ketones, UA: NEGATIVE
Nitrite, UA: NEGATIVE
Urobilinogen, UA: NEGATIVE
pH, UA: 6

## 2016-03-12 LAB — CBC WITH DIFFERENTIAL/PLATELET
Basophils Absolute: 72 cells/uL (ref 0–200)
Basophils Relative: 1 %
Eosinophils Absolute: 144 cells/uL (ref 15–500)
Eosinophils Relative: 2 %
HCT: 33.8 % — ABNORMAL LOW (ref 35.0–45.0)
Hemoglobin: 11.1 g/dL — ABNORMAL LOW (ref 11.7–15.5)
Lymphocytes Relative: 24 %
Lymphs Abs: 1728 cells/uL (ref 850–3900)
MCH: 30.2 pg (ref 27.0–33.0)
MCHC: 32.8 g/dL (ref 32.0–36.0)
MCV: 91.8 fL (ref 80.0–100.0)
MPV: 10.7 fL (ref 7.5–12.5)
Monocytes Absolute: 576 cells/uL (ref 200–950)
Monocytes Relative: 8 %
Neutro Abs: 4680 cells/uL (ref 1500–7800)
Neutrophils Relative %: 65 %
Platelets: 358 10*3/uL (ref 140–400)
RBC: 3.68 MIL/uL — ABNORMAL LOW (ref 3.80–5.10)
RDW: 14.8 % (ref 11.0–15.0)
WBC: 7.2 10*3/uL (ref 4.0–10.5)

## 2016-03-12 LAB — LIPID PANEL
Cholesterol: 132 mg/dL (ref <200)
HDL: 37 mg/dL — ABNORMAL LOW (ref >50)
LDL Cholesterol: 74 mg/dL (ref <100)
Total CHOL/HDL Ratio: 3.6 Ratio (ref <5.0)
Triglycerides: 106 mg/dL (ref <150)
VLDL: 21 mg/dL (ref <30)

## 2016-03-12 LAB — COMPREHENSIVE METABOLIC PANEL
ALT: 10 U/L (ref 6–29)
AST: 16 U/L (ref 10–35)
Albumin: 4.2 g/dL (ref 3.6–5.1)
Alkaline Phosphatase: 61 U/L (ref 33–130)
BUN: 25 mg/dL (ref 7–25)
CO2: 28 mmol/L (ref 20–31)
Calcium: 10.4 mg/dL (ref 8.6–10.4)
Chloride: 102 mmol/L (ref 98–110)
Creat: 1.68 mg/dL — ABNORMAL HIGH (ref 0.60–0.93)
Glucose, Bld: 253 mg/dL — ABNORMAL HIGH (ref 65–99)
Potassium: 4.4 mmol/L (ref 3.5–5.3)
Sodium: 138 mmol/L (ref 135–146)
Total Bilirubin: 0.4 mg/dL (ref 0.2–1.2)
Total Protein: 8.1 g/dL (ref 6.1–8.1)

## 2016-03-12 LAB — TSH: TSH: 3.96 mIU/L

## 2016-03-12 NOTE — Patient Instructions (Signed)
You can call to schedule your appointment with the psychiatrist. A few offices are listed below for you to call.   [facility] [address], [place] [address]  Phone: [phone]   Do not take PROZAC (fluoxetine).   Call me and let me know when your appointment is with them.

## 2016-03-12 NOTE — Progress Notes (Signed)
Subjective:    Patient ID: Courtney Goodman, female    DOB: [birth year], 71 y.o.   MRN: [id number]  HPI Chief Complaint  Patient presents with  . sleep study    discuss results, and other lab results.    She is here to follow up on abnormal sleep study. Recent study showed that she has REM behavior disorder with sleep talking and significant limb movements.  She reports difficulty sleeping and states she thinks her medication for depression fluoxetine has been keeping her awake.  States she stopped the medication last week.   States she talks in her sleep and screams out during her the night, this has been ongoing for about a year, started about the same time she started on Prozac. She is unable to remember her dreams. States she sometimes she feels like she is seeing people and this is very upsetting.  States she thinks this is due to stress.  Reports increased fatigue and admits to feeling depressed.  History of vitamin D deficiency and anemia.   States she has a mental health history and was hospitalized for inpatient therapy at [facility] for a couple of weeks approximately 10 years ago. Does not currently have a therapist or psychiatrist.   Has been to counseling in the past, several years ago.  Denies SI or HI.   Is not taking tramadol for pain. States she does not need it.  History of   Does not drink alcohol or smoke. Denies taking medication for sleep.   Does not exercise.  Sleeps in the dark and states the room is quite, no TV.   She is seeing endocrinologist for diabetes management and nephrologist for CKD.     Depression screen [facility] 2/9 03/12/2016 11/27/2015  Decreased Interest 3 1  Down, Depressed, Hopeless 3 3  PHQ - 2 Score 6 4  Altered sleeping 1 2  Tired, decreased energy 3 1  Change in appetite 1 3  Feeling bad or failure about yourself  3 0  Trouble concentrating 1 0  Moving slowly or fidgety/restless 3 0  Suicidal thoughts 0 0  PHQ-9 Score 18 10    Reviewed  allergies, medications, past medical, surgical, and social history.   Review of Systems Pertinent positives and negatives in the history of present illness.     Objective:   Physical Exam  Constitutional: She is oriented to person, place, and time. She appears well-developed and well-nourished. No distress.  HENT:  Mouth/Throat: Uvula is midline, oropharynx is clear and moist and mucous membranes are normal.  Eyes: Conjunctivae, EOM and lids are normal. Pupils are equal, round, and reactive to light.  Neck: Normal range of motion. Neck supple.  Cardiovascular: Normal rate, regular rhythm, normal heart sounds and normal pulses.   Pulmonary/Chest: Effort normal and breath sounds normal.  Musculoskeletal: Normal range of motion.  No cogwheel rigidity  Lymphadenopathy:    She has no cervical adenopathy.  Neurological: She is alert and oriented to person, place, and time. She has normal strength and normal reflexes. She displays no tremor. No cranial nerve deficit or sensory deficit. Coordination and gait normal.  Skin: Skin is warm and dry. No pallor.  Psychiatric: She has a normal mood and affect. Her speech is normal and behavior is normal. Judgment and thought content normal. Cognition and memory are normal.   BP 122/60   Pulse 73   Resp 16   Wt 173 lb 12.8 oz (78.8 kg)   SpO2 99%  BMI 29.83 kg/m       Assessment & Plan:  Sleep disturbances - Plan: CBC with Differential/Platelet, Comprehensive metabolic panel  Chronic kidney disease, unspecified CKD stage - Plan: Comprehensive metabolic panel  Hallucination, visual - Plan: Urinalysis Dipstick, CBC with Differential/Platelet, Comprehensive metabolic panel, TSH, Vitamin B12  Depression, unspecified depression type  Anemia of chronic disease - Plan: CBC with Differential/Platelet  Fatigue, unspecified type - Plan: CBC with Differential/Platelet, Comprehensive metabolic panel, TSH, Vitamin B12, Iron, TIBC and Ferritin Panel,  VITAMIN D 25 Hydroxy (Vit-D Deficiency, Fractures)  Vitamin D deficiency - Plan: VITAMIN D 25 Hydroxy (Vit-D Deficiency, Fractures)  Hyperlipidemia associated with type 2 diabetes mellitus ([place]) - Plan: CANCELED: Lipid panel  MMSE 26/30 Referral to psychiatrist for depression, visual hallucinations, and REM sleep behavior. She will call and schedule appointment.  Suspect symptoms may have been exacerbated by Prozac since her symptoms started around the same time as starting the medication.  She has not taken Prozac since last week. She will stop medication.  UA dipstick does not show an infection.  Plan to check labs to assess anemia, vitamin D deficiency and hyperlipidemia.  Will adjust medication as appropriate.  She will continue seeing specialists for diabetes and CKD.

## 2016-03-13 LAB — IRON,TIBC AND FERRITIN PANEL
%SAT: 25 % (ref 11–50)
Ferritin: 419 ng/mL — ABNORMAL HIGH (ref 20–288)
Iron: 84 ug/dL (ref 45–160)
TIBC: 340 ug/dL (ref 250–450)

## 2016-03-13 LAB — VITAMIN D 25 HYDROXY (VIT D DEFICIENCY, FRACTURES): Vit D, 25-Hydroxy: 28 ng/mL — ABNORMAL LOW (ref 30–100)

## 2016-03-13 LAB — VITAMIN B12: Vitamin B-12: 566 pg/mL (ref 200–1100)

## 2016-03-19 ENCOUNTER — Encounter: Payer: Self-pay | Admitting: Endocrinology

## 2016-03-19 ENCOUNTER — Encounter: Payer: Self-pay | Admitting: Family Medicine

## 2016-03-19 ENCOUNTER — Ambulatory Visit (INDEPENDENT_AMBULATORY_CARE_PROVIDER_SITE_OTHER): Payer: Medicare HMO | Admitting: Endocrinology

## 2016-03-19 VITALS — BP 122/68 | HR 76 | Ht 64.0 in | Wt 175.0 lb

## 2016-03-19 DIAGNOSIS — N183 Chronic kidney disease, stage 3 unspecified: Secondary | ICD-10-CM

## 2016-03-19 DIAGNOSIS — Z794 Long term (current) use of insulin: Secondary | ICD-10-CM | POA: Diagnosis not present

## 2016-03-19 DIAGNOSIS — E1122 Type 2 diabetes mellitus with diabetic chronic kidney disease: Secondary | ICD-10-CM | POA: Diagnosis not present

## 2016-03-19 LAB — POCT GLYCOSYLATED HEMOGLOBIN (HGB A1C): Hemoglobin A1C: 10.1

## 2016-03-19 MED ORDER — INSULIN DETEMIR 100 UNIT/ML FLEXPEN: 140.0000 [IU] | PEN_INJECTOR | SUBCUTANEOUS | 11 refills | Status: DC

## 2016-03-19 NOTE — Progress Notes (Signed)
Subjective:    Patient ID: Courtney Goodman, female    DOB: 22-Apr-1945, 71 y.o.   MRN: [id number]  HPI Pt returns for f/u of diabetes mellitus: DM type: Insulin-requiring type 2 Dx'ed: 7829 Complications: renal insufficiency and CAD Therapy: insulin since 2009 GDM: never DKA: never Severe hypoglycemia: once (2012) Pancreatitis: never Other: she is on qd insulin, due to noncompliance.   Interval history: no cbg record, but states cbg's vary from 100-300's.  She takes novolog 70/30, and levemir qhs.  She misses the insulin approx once a day.  pt states she feels well in general.  Past Medical History:  Diagnosis Date  . Asthma   . COPD (chronic obstructive pulmonary disease) ([place])   . Diabetes mellitus without complication ([place])   . Dysrhythmia   . Hypertension   . Shortness of breath dyspnea     Past Surgical History:  Procedure Laterality Date  . CORONARY ANGIOPLASTY      Social History   Social History  . Marital status: Divorced    Spouse name: N/A  . Number of children: N/A  . Years of education: N/A   Occupational History  . Not on file.   Social History Main Topics  . Smoking status: Former Research scientist (life sciences)  . Smokeless tobacco: Never Used  . Alcohol use No  . Drug use: No  . Sexual activity: Not on file   Other Topics Concern  . Not on file   Social History Narrative  . No narrative on file    Current Outpatient Prescriptions on File Prior to Visit  Medication Sig Dispense Refill  . ACCU-CHEK AVIVA PLUS test strip Test 3-4 times a day    . ACCU-CHEK SOFTCLIX LANCETS lancets Test 3-4 times a day    . amLODipine-benazepril (LOTREL) 5-20 MG capsule TAKE 1 CAPSULE BY MOUTH EVERY DAY 90 capsule 0  . aspirin EC 81 MG tablet Take 81 mg by mouth every morning.     . carvedilol (COREG) 6.25 MG tablet Take 6.25 mg by mouth 2 (two) times daily with a meal.    . fenofibrate (TRICOR) 145 MG tablet TAKE 1 TABLET BY MOUTH EVERY DAY 90 tablet 1  . liraglutide (VICTOZA) 18  MG/3ML SOPN Inject 1.8 mg into the skin every morning.    Marland Kitchen omeprazole (PRILOSEC) 40 MG capsule Take 40 mg by mouth daily.    . simvastatin (ZOCOR) 20 MG tablet Take 20 mg by mouth every evening.    . traMADol (ULTRAM) 50 MG tablet TAKE 1 TABLET BY MOUTH TWICE DAILY AS NEEDED 180 tablet 0  . ferrous sulfate 325 (65 FE) MG tablet TAKE 1 TABLET BY MOUTH EVERY DAY FOR IRON DEFICIENCY (Patient not taking: Reported on 03/19/2016) 90 tablet 0   No current facility-administered medications on file prior to visit.     No Known Allergies  Family History  Problem Relation Age of Onset  . Diabetes Mother     BP 122/68   Pulse 76   Ht 5\' 4"  (1.626 m)   Wt 175 lb (79.4 kg)   SpO2 98%   BMI 30.04 kg/m    Review of Systems She denies hypoglycemia.      Objective:   Physical Exam VITAL SIGNS:  See vs page GENERAL: no distress Pulses: dorsalis pedis intact bilat.   MSK: no deformity of the feet.  CV: no leg edema Skin:  no ulcer on the feet.  normal color and temp on the feet. Neuro: sensation is intact  to touch on the feet Ext: There is bilateral onychomycosis of the toenails.    A1c=10.5%    Assessment & Plan:  Insulin-requiring type 2 DM, with renal insufficiency: She is on several redundant insulins.   Patient is advised the following:  Patient Instructions  check your blood sugar twice a day.  vary the time of day when you check, between before the 3 meals, and at bedtime.  also check if you have symptoms of your blood sugar being too high or too low.  please keep a record of the readings and bring it to your next appointment here (or you can bring the meter itself).  You can write it on any piece of paper.  please call us sooner if your blood sugar goes below 70, or if you have a lot of readings over 200.   For now, please: Stop taking the novolog 70/30, and:  Increase the levemir to 140 units each morning.   On this type of insulin schedule, you should eat meals on a regular  schedule.  If a meal is missed or significantly delayed, your blood sugar could go low.  Please come back for a follow-up appointment in 2 weeks.

## 2016-03-19 NOTE — Patient Instructions (Addendum)
check your blood sugar twice a day.  vary the time of day when you check, between before the 3 meals, and at bedtime.  also check if you have symptoms of your blood sugar being too high or too low.  please keep a record of the readings and bring it to your next appointment here (or you can bring the meter itself).  You can write it on any piece of paper.  please call us sooner if your blood sugar goes below 70, or if you have a lot of readings over 200.   For now, please: Stop taking the novolog 70/30, and:  Increase the levemir to 140 units each morning.   On this type of insulin schedule, you should eat meals on a regular schedule.  If a meal is missed or significantly delayed, your blood sugar could go low.  Please come back for a follow-up appointment in 2 weeks.

## 2016-03-20 ENCOUNTER — Encounter: Payer: Self-pay | Admitting: Family Medicine

## 2016-03-20 ENCOUNTER — Other Ambulatory Visit: Payer: Self-pay | Admitting: Family Medicine

## 2016-03-20 ENCOUNTER — Telehealth: Payer: Self-pay | Admitting: Endocrinology

## 2016-03-20 NOTE — Telephone Encounter (Signed)
I contacted the patient and requested a call back to discuss further.

## 2016-03-20 NOTE — Telephone Encounter (Signed)
Pt called in and said that she has some questions about her insulin that Dr. Loanne Drilling changed yesterday.  She requests call back.

## 2016-03-21 ENCOUNTER — Other Ambulatory Visit: Payer: Self-pay | Admitting: Family Medicine

## 2016-03-22 ENCOUNTER — Encounter: Payer: Self-pay | Admitting: Family Medicine

## 2016-03-22 DIAGNOSIS — Z7982 Long term (current) use of aspirin: Secondary | ICD-10-CM | POA: Diagnosis not present

## 2016-03-22 DIAGNOSIS — J449 Chronic obstructive pulmonary disease, unspecified: Secondary | ICD-10-CM | POA: Diagnosis not present

## 2016-03-22 DIAGNOSIS — Z87891 Personal history of nicotine dependence: Secondary | ICD-10-CM | POA: Diagnosis not present

## 2016-03-22 DIAGNOSIS — I2510 Atherosclerotic heart disease of native coronary artery without angina pectoris: Secondary | ICD-10-CM | POA: Diagnosis not present

## 2016-03-22 DIAGNOSIS — Z9181 History of falling: Secondary | ICD-10-CM | POA: Diagnosis not present

## 2016-03-22 DIAGNOSIS — F329 Major depressive disorder, single episode, unspecified: Secondary | ICD-10-CM | POA: Diagnosis not present

## 2016-03-22 DIAGNOSIS — E119 Type 2 diabetes mellitus without complications: Secondary | ICD-10-CM | POA: Diagnosis not present

## 2016-03-22 DIAGNOSIS — I10 Essential (primary) hypertension: Secondary | ICD-10-CM | POA: Diagnosis not present

## 2016-03-22 DIAGNOSIS — Z794 Long term (current) use of insulin: Secondary | ICD-10-CM | POA: Diagnosis not present

## 2016-03-25 DIAGNOSIS — Z794 Long term (current) use of insulin: Secondary | ICD-10-CM | POA: Diagnosis not present

## 2016-03-25 DIAGNOSIS — Z6830 Body mass index (BMI) 30.0-30.9, adult: Secondary | ICD-10-CM | POA: Diagnosis not present

## 2016-03-25 DIAGNOSIS — F329 Major depressive disorder, single episode, unspecified: Secondary | ICD-10-CM | POA: Diagnosis not present

## 2016-03-25 DIAGNOSIS — Z87891 Personal history of nicotine dependence: Secondary | ICD-10-CM | POA: Diagnosis not present

## 2016-03-25 DIAGNOSIS — I2510 Atherosclerotic heart disease of native coronary artery without angina pectoris: Secondary | ICD-10-CM | POA: Diagnosis not present

## 2016-03-25 DIAGNOSIS — Z9181 History of falling: Secondary | ICD-10-CM | POA: Diagnosis not present

## 2016-03-25 DIAGNOSIS — J449 Chronic obstructive pulmonary disease, unspecified: Secondary | ICD-10-CM | POA: Diagnosis not present

## 2016-03-25 DIAGNOSIS — Z7982 Long term (current) use of aspirin: Secondary | ICD-10-CM | POA: Diagnosis not present

## 2016-03-25 DIAGNOSIS — E119 Type 2 diabetes mellitus without complications: Secondary | ICD-10-CM | POA: Diagnosis not present

## 2016-03-25 DIAGNOSIS — I10 Essential (primary) hypertension: Secondary | ICD-10-CM | POA: Diagnosis not present

## 2016-03-25 DIAGNOSIS — E1129 Type 2 diabetes mellitus with other diabetic kidney complication: Secondary | ICD-10-CM | POA: Diagnosis not present

## 2016-03-25 DIAGNOSIS — D649 Anemia, unspecified: Secondary | ICD-10-CM | POA: Diagnosis not present

## 2016-03-25 DIAGNOSIS — N183 Chronic kidney disease, stage 3 (moderate): Secondary | ICD-10-CM | POA: Diagnosis not present

## 2016-03-25 NOTE — Telephone Encounter (Signed)
[facility] needs levemir and the tricor, pen needles and the accucheck test strips.

## 2016-03-26 ENCOUNTER — Telehealth: Payer: Self-pay | Admitting: Family Medicine

## 2016-03-26 ENCOUNTER — Telehealth: Payer: Self-pay | Admitting: Endocrinology

## 2016-03-26 MED ORDER — FENOFIBRATE 145 MG PO TABS: 145.0000 mg | ORAL_TABLET | Freq: Every day | ORAL | 1 refills | Status: DC

## 2016-03-26 MED ORDER — INSULIN DETEMIR 100 UNIT/ML FLEXPEN: 140.0000 [IU] | PEN_INJECTOR | SUBCUTANEOUS | 11 refills | Status: DC

## 2016-03-26 NOTE — Telephone Encounter (Signed)
I contacted the patient and advised we could send the Levemir to whichever pharmacy she preferred, but the fenofibrate would need to come from her PCP. Patient voiced understanding and will call back to advise of the pharmacy to send the levemir.

## 2016-03-26 NOTE — Telephone Encounter (Addendum)
Noted, pharmacy updated and rx for levemir submitted.

## 2016-03-26 NOTE — Telephone Encounter (Signed)
Needs pharmacy changed to [facility] on [address] for her RXs

## 2016-03-26 NOTE — Telephone Encounter (Signed)
Pt called to request refill on Tricor 145 mg to NEW PHARMACY at [facility]

## 2016-03-26 NOTE — Telephone Encounter (Signed)
Refill of  Insulin Detemir (LEVEMIR FLEXPEN) 100 UNIT/ML Pen 45 mL   fenofibrate (TRICOR) 145 MG tablet 90 tablet   Please call her she is trying to find a pharmacy near her to send it to.

## 2016-03-26 NOTE — Telephone Encounter (Signed)
done

## 2016-03-27 DIAGNOSIS — Z794 Long term (current) use of insulin: Secondary | ICD-10-CM | POA: Diagnosis not present

## 2016-03-27 DIAGNOSIS — E119 Type 2 diabetes mellitus without complications: Secondary | ICD-10-CM | POA: Diagnosis not present

## 2016-03-27 DIAGNOSIS — Z87891 Personal history of nicotine dependence: Secondary | ICD-10-CM | POA: Diagnosis not present

## 2016-03-27 DIAGNOSIS — Z7982 Long term (current) use of aspirin: Secondary | ICD-10-CM | POA: Diagnosis not present

## 2016-03-27 DIAGNOSIS — Z9181 History of falling: Secondary | ICD-10-CM | POA: Diagnosis not present

## 2016-03-27 DIAGNOSIS — I10 Essential (primary) hypertension: Secondary | ICD-10-CM | POA: Diagnosis not present

## 2016-03-27 DIAGNOSIS — I2510 Atherosclerotic heart disease of native coronary artery without angina pectoris: Secondary | ICD-10-CM | POA: Diagnosis not present

## 2016-03-27 DIAGNOSIS — F329 Major depressive disorder, single episode, unspecified: Secondary | ICD-10-CM | POA: Diagnosis not present

## 2016-03-27 DIAGNOSIS — J449 Chronic obstructive pulmonary disease, unspecified: Secondary | ICD-10-CM | POA: Diagnosis not present

## 2016-03-27 MED ORDER — INSULIN DETEMIR 100 UNIT/ML FLEXPEN: 140.0000 [IU] | PEN_INJECTOR | SUBCUTANEOUS | 11 refills | Status: DC

## 2016-03-27 NOTE — Telephone Encounter (Signed)
Daughter, called and found that the levemir is available at [facility] on [address] please call in rx to that location so they can pick up today

## 2016-03-27 NOTE — Telephone Encounter (Signed)
Rx submitted to [facility] on [address].

## 2016-03-27 NOTE — Addendum Note (Signed)
Addended by: Verlin Grills T on: 03/27/2016 01:14 PM   Modules accepted: Orders

## 2016-03-27 NOTE — Telephone Encounter (Signed)
Pt needs to know what to do because the levemir will not be into the pharmacy until tomorrow, what should she do for today for her insulin?

## 2016-03-27 NOTE — Telephone Encounter (Signed)
Went to the wrong MD previously

## 2016-03-27 NOTE — Telephone Encounter (Signed)
Please refill prn 

## 2016-03-29 DIAGNOSIS — Z7982 Long term (current) use of aspirin: Secondary | ICD-10-CM | POA: Diagnosis not present

## 2016-03-29 DIAGNOSIS — E119 Type 2 diabetes mellitus without complications: Secondary | ICD-10-CM | POA: Diagnosis not present

## 2016-03-29 DIAGNOSIS — Z87891 Personal history of nicotine dependence: Secondary | ICD-10-CM | POA: Diagnosis not present

## 2016-03-29 DIAGNOSIS — I10 Essential (primary) hypertension: Secondary | ICD-10-CM | POA: Diagnosis not present

## 2016-03-29 DIAGNOSIS — F329 Major depressive disorder, single episode, unspecified: Secondary | ICD-10-CM | POA: Diagnosis not present

## 2016-03-29 DIAGNOSIS — J449 Chronic obstructive pulmonary disease, unspecified: Secondary | ICD-10-CM | POA: Diagnosis not present

## 2016-03-29 DIAGNOSIS — Z794 Long term (current) use of insulin: Secondary | ICD-10-CM | POA: Diagnosis not present

## 2016-03-29 DIAGNOSIS — Z9181 History of falling: Secondary | ICD-10-CM | POA: Diagnosis not present

## 2016-03-29 DIAGNOSIS — I2510 Atherosclerotic heart disease of native coronary artery without angina pectoris: Secondary | ICD-10-CM | POA: Diagnosis not present

## 2016-04-01 DIAGNOSIS — Z87891 Personal history of nicotine dependence: Secondary | ICD-10-CM | POA: Diagnosis not present

## 2016-04-01 DIAGNOSIS — J449 Chronic obstructive pulmonary disease, unspecified: Secondary | ICD-10-CM | POA: Diagnosis not present

## 2016-04-01 DIAGNOSIS — Z794 Long term (current) use of insulin: Secondary | ICD-10-CM | POA: Diagnosis not present

## 2016-04-01 DIAGNOSIS — I10 Essential (primary) hypertension: Secondary | ICD-10-CM | POA: Diagnosis not present

## 2016-04-01 DIAGNOSIS — I2510 Atherosclerotic heart disease of native coronary artery without angina pectoris: Secondary | ICD-10-CM | POA: Diagnosis not present

## 2016-04-01 DIAGNOSIS — Z7982 Long term (current) use of aspirin: Secondary | ICD-10-CM | POA: Diagnosis not present

## 2016-04-01 DIAGNOSIS — F329 Major depressive disorder, single episode, unspecified: Secondary | ICD-10-CM | POA: Diagnosis not present

## 2016-04-01 DIAGNOSIS — Z9181 History of falling: Secondary | ICD-10-CM | POA: Diagnosis not present

## 2016-04-01 DIAGNOSIS — E119 Type 2 diabetes mellitus without complications: Secondary | ICD-10-CM | POA: Diagnosis not present

## 2016-04-02 ENCOUNTER — Ambulatory Visit (INDEPENDENT_AMBULATORY_CARE_PROVIDER_SITE_OTHER): Payer: Medicare HMO | Admitting: Podiatry

## 2016-04-02 ENCOUNTER — Encounter: Payer: Self-pay | Admitting: Podiatry

## 2016-04-02 DIAGNOSIS — M79609 Pain in unspecified limb: Secondary | ICD-10-CM | POA: Diagnosis not present

## 2016-04-02 DIAGNOSIS — B351 Tinea unguium: Secondary | ICD-10-CM

## 2016-04-02 DIAGNOSIS — E1142 Type 2 diabetes mellitus with diabetic polyneuropathy: Secondary | ICD-10-CM

## 2016-04-02 NOTE — Progress Notes (Signed)
Patient ID: Courtney Goodman, female   DOB: Nov 30, 1945, 71 y.o.   MRN: [id number]   Subjective: This diabetic patient presents today for a scheduled visit complaining of uncomfortable toenails walking wearing shoes and request toenail debridement  Objective: Orientated 3 DP pulses 2/4 bilaterally PT pulses 1/4 bilaterally Capillary reflex immediate bilaterally Sensation to 10 g monofilament wire intact 5/5 bilaterally Vibratory sensation nonreactive bilaterally Ankle reflexes reactive bilaterally Well-healed surgical scar dorsal medial first MPJ right and medial lateral right ankle Open skin lesions bilaterally The toenails are elongated, hypertrophic, discolored, deformed and tender direct palpation 6-10 Manual motor testing dorsi flexion, plantar flexion 5/5 bilaterally  Assessment: Diabetic with loss of vibratory sensation//peripheral neuropathy Symptomatic onychomycoses 6-10  Plan: Debridement of toenails 6-10 mechanical he and allegedly without any bleeding  Reappoint 3 months

## 2016-04-02 NOTE — Patient Instructions (Signed)

## 2016-04-03 DIAGNOSIS — Z794 Long term (current) use of insulin: Secondary | ICD-10-CM | POA: Diagnosis not present

## 2016-04-03 DIAGNOSIS — F329 Major depressive disorder, single episode, unspecified: Secondary | ICD-10-CM | POA: Diagnosis not present

## 2016-04-03 DIAGNOSIS — I10 Essential (primary) hypertension: Secondary | ICD-10-CM | POA: Diagnosis not present

## 2016-04-03 DIAGNOSIS — J449 Chronic obstructive pulmonary disease, unspecified: Secondary | ICD-10-CM | POA: Diagnosis not present

## 2016-04-03 DIAGNOSIS — Z9181 History of falling: Secondary | ICD-10-CM | POA: Diagnosis not present

## 2016-04-03 DIAGNOSIS — I2510 Atherosclerotic heart disease of native coronary artery without angina pectoris: Secondary | ICD-10-CM | POA: Diagnosis not present

## 2016-04-03 DIAGNOSIS — Z87891 Personal history of nicotine dependence: Secondary | ICD-10-CM | POA: Diagnosis not present

## 2016-04-03 DIAGNOSIS — E119 Type 2 diabetes mellitus without complications: Secondary | ICD-10-CM | POA: Diagnosis not present

## 2016-04-03 DIAGNOSIS — Z7982 Long term (current) use of aspirin: Secondary | ICD-10-CM | POA: Diagnosis not present

## 2016-04-05 ENCOUNTER — Encounter: Payer: Self-pay | Admitting: Endocrinology

## 2016-04-05 ENCOUNTER — Ambulatory Visit (INDEPENDENT_AMBULATORY_CARE_PROVIDER_SITE_OTHER): Payer: Medicare HMO | Admitting: Endocrinology

## 2016-04-05 VITALS — BP 122/64 | HR 77 | Ht 64.0 in | Wt 178.0 lb

## 2016-04-05 DIAGNOSIS — E1122 Type 2 diabetes mellitus with diabetic chronic kidney disease: Secondary | ICD-10-CM

## 2016-04-05 DIAGNOSIS — I2510 Atherosclerotic heart disease of native coronary artery without angina pectoris: Secondary | ICD-10-CM | POA: Diagnosis not present

## 2016-04-05 DIAGNOSIS — F329 Major depressive disorder, single episode, unspecified: Secondary | ICD-10-CM | POA: Diagnosis not present

## 2016-04-05 DIAGNOSIS — N183 Chronic kidney disease, stage 3 unspecified: Secondary | ICD-10-CM

## 2016-04-05 DIAGNOSIS — Z87891 Personal history of nicotine dependence: Secondary | ICD-10-CM | POA: Diagnosis not present

## 2016-04-05 DIAGNOSIS — Z794 Long term (current) use of insulin: Secondary | ICD-10-CM | POA: Diagnosis not present

## 2016-04-05 DIAGNOSIS — Z9181 History of falling: Secondary | ICD-10-CM | POA: Diagnosis not present

## 2016-04-05 DIAGNOSIS — I10 Essential (primary) hypertension: Secondary | ICD-10-CM | POA: Diagnosis not present

## 2016-04-05 DIAGNOSIS — J449 Chronic obstructive pulmonary disease, unspecified: Secondary | ICD-10-CM | POA: Diagnosis not present

## 2016-04-05 DIAGNOSIS — E119 Type 2 diabetes mellitus without complications: Secondary | ICD-10-CM | POA: Diagnosis not present

## 2016-04-05 DIAGNOSIS — Z7982 Long term (current) use of aspirin: Secondary | ICD-10-CM | POA: Diagnosis not present

## 2016-04-05 LAB — GLUCOSE, POCT (MANUAL RESULT ENTRY): POC Glucose: 113 mg/dl — AB (ref 70–99)

## 2016-04-05 NOTE — Progress Notes (Signed)
Subjective:    Patient ID: Courtney Goodman, female    DOB: January 18, 1946, 71 y.o.   MRN: [id number]  HPI Pt returns for f/u of diabetes mellitus: DM type: Insulin-requiring type 2 Dx'ed: 1443 Complications: renal insufficiency and CAD Therapy: insulin since 2009, and victoza. GDM: never DKA: never Severe hypoglycemia: once (2012) Pancreatitis: never Other: she is on qd insulin, due to h/o noncompliance.   Interval history: she brings a record of her cbg's which i have reviewed today.  It varies from 83-528.  cbg is lowest when she misses a meal.   However, it is in general higher as the day goes on.  pt states she feels well in general.   Past Medical History:  Diagnosis Date  . Asthma   . COPD (chronic obstructive pulmonary disease) ([place])   . Diabetes mellitus without complication ([place])   . Dysrhythmia   . Hypertension   . Shortness of breath dyspnea     Past Surgical History:  Procedure Laterality Date  . CORONARY ANGIOPLASTY      Social History   Social History  . Marital status: Divorced    Spouse name: N/A  . Number of children: N/A  . Years of education: N/A   Occupational History  . Not on file.   Social History Main Topics  . Smoking status: Former Research scientist (life sciences)  . Smokeless tobacco: Never Used  . Alcohol use No  . Drug use: No  . Sexual activity: Not on file   Other Topics Concern  . Not on file   Social History Narrative  . No narrative on file    Current Outpatient Prescriptions on File Prior to Visit  Medication Sig Dispense Refill  . ACCU-CHEK AVIVA PLUS test strip Test 3-4 times a day    . ACCU-CHEK SOFTCLIX LANCETS lancets Test 3-4 times a day    . amLODipine-benazepril (LOTREL) 5-20 MG capsule TAKE 1 CAPSULE BY MOUTH EVERY DAY 90 capsule 0  . aspirin EC 81 MG tablet Take 81 mg by mouth every morning.     . carvedilol (COREG) 6.25 MG tablet TAKE 1 TABLET BY MOUTH TWICE DAILY 180 tablet 1  . fenofibrate (TRICOR) 145 MG tablet Take 1 tablet (145 mg  total) by mouth daily. 90 tablet 1  . Insulin Detemir (LEVEMIR FLEXPEN) 100 UNIT/ML Pen Inject 140 Units into the skin every morning. And pen needles 3/day 45 mL 11  . omeprazole (PRILOSEC) 40 MG capsule Take 40 mg by mouth daily.    . simvastatin (ZOCOR) 20 MG tablet TAKE 1 TABLET BY MOUTH EVERY NIGHT 90 tablet 1  . traMADol (ULTRAM) 50 MG tablet TAKE 1 TABLET BY MOUTH TWICE DAILY AS NEEDED 180 tablet 0  . VICTOZA 18 MG/3ML SOPN INJECT 1.8MG  EVERY MORNING 27 pen 1  . ferrous sulfate 325 (65 FE) MG tablet TAKE 1 TABLET BY MOUTH EVERY DAY FOR IRON DEFICIENCY (Patient not taking: Reported on 04/05/2016) 90 tablet 0   No current facility-administered medications on file prior to visit.     No Known Allergies  Family History  Problem Relation Age of Onset  . Diabetes Mother     BP 122/64   Pulse 77   Ht 5\' 4"  (1.626 m)   Wt 178 lb (80.7 kg)   SpO2 98%   BMI 30.55 kg/m    Review of Systems She denies hypoglycemia.      Objective:   Physical Exam VITAL SIGNS:  See vs page GENERAL: no distress Pulses:  dorsalis pedis intact bilat.   MSK: no deformity of the feet.  CV: no leg edema.  Skin:  no ulcer on the feet, but the skin is dry.  normal color and temp on the feet. Neuro: sensation is intact to touch on the feet Ext: There is bilateral onychomycosis of the toenails.   Lab Results  Component Value Date   CREATININE 1.68 (H) 03/12/2016   BUN 25 03/12/2016   NA 138 03/12/2016   K 4.4 03/12/2016   CL 102 03/12/2016   CO2 28 03/12/2016       Assessment & Plan:  Insulin-requiring type 2 DM, with CAD: glycemic control is apparently improved.  Check fructosamine.    Patient is advised the following: Patient Instructions  check your blood sugar twice a day.  vary the time of day when you check, between before the 3 meals, and at bedtime.  also check if you have symptoms of your blood sugar being too high or too low.  please keep a record of the readings and bring it to your  next appointment here (or you can bring the meter itself).  You can write it on any piece of paper.  please call us sooner if your blood sugar goes below 70, or if you have a lot of readings over 200.   please continue the levemir, 140 units each morning.   On this type of insulin schedule, you should eat meals on a regular schedule.  If a meal is missed or significantly delayed, your blood sugar could go low. blood tests are requested for you today.  We'll let you know about the results.  Please come back for a follow-up appointment in 2 months.

## 2016-04-05 NOTE — Patient Instructions (Addendum)
check your blood sugar twice a day.  vary the time of day when you check, between before the 3 meals, and at bedtime.  also check if you have symptoms of your blood sugar being too high or too low.  please keep a record of the readings and bring it to your next appointment here (or you can bring the meter itself).  You can write it on any piece of paper.  please call us sooner if your blood sugar goes below 70, or if you have a lot of readings over 200.   please continue the levemir, 140 units each morning.   On this type of insulin schedule, you should eat meals on a regular schedule.  If a meal is missed or significantly delayed, your blood sugar could go low. blood tests are requested for you today.  We'll let you know about the results.  Please come back for a follow-up appointment in 2 months.

## 2016-04-08 LAB — FRUCTOSAMINE: Fructosamine: 393 umol/L — ABNORMAL HIGH (ref 190–270)

## 2016-04-09 DIAGNOSIS — Z9181 History of falling: Secondary | ICD-10-CM | POA: Diagnosis not present

## 2016-04-09 DIAGNOSIS — Z87891 Personal history of nicotine dependence: Secondary | ICD-10-CM | POA: Diagnosis not present

## 2016-04-09 DIAGNOSIS — Z7982 Long term (current) use of aspirin: Secondary | ICD-10-CM | POA: Diagnosis not present

## 2016-04-09 DIAGNOSIS — J449 Chronic obstructive pulmonary disease, unspecified: Secondary | ICD-10-CM | POA: Diagnosis not present

## 2016-04-09 DIAGNOSIS — Z794 Long term (current) use of insulin: Secondary | ICD-10-CM | POA: Diagnosis not present

## 2016-04-09 DIAGNOSIS — E119 Type 2 diabetes mellitus without complications: Secondary | ICD-10-CM | POA: Diagnosis not present

## 2016-04-09 DIAGNOSIS — I10 Essential (primary) hypertension: Secondary | ICD-10-CM | POA: Diagnosis not present

## 2016-04-09 DIAGNOSIS — I2510 Atherosclerotic heart disease of native coronary artery without angina pectoris: Secondary | ICD-10-CM | POA: Diagnosis not present

## 2016-04-09 DIAGNOSIS — F329 Major depressive disorder, single episode, unspecified: Secondary | ICD-10-CM | POA: Diagnosis not present

## 2016-04-12 DIAGNOSIS — F329 Major depressive disorder, single episode, unspecified: Secondary | ICD-10-CM | POA: Diagnosis not present

## 2016-04-12 DIAGNOSIS — Z794 Long term (current) use of insulin: Secondary | ICD-10-CM | POA: Diagnosis not present

## 2016-04-12 DIAGNOSIS — I10 Essential (primary) hypertension: Secondary | ICD-10-CM | POA: Diagnosis not present

## 2016-04-12 DIAGNOSIS — Z7982 Long term (current) use of aspirin: Secondary | ICD-10-CM | POA: Diagnosis not present

## 2016-04-12 DIAGNOSIS — Z9181 History of falling: Secondary | ICD-10-CM | POA: Diagnosis not present

## 2016-04-12 DIAGNOSIS — E119 Type 2 diabetes mellitus without complications: Secondary | ICD-10-CM | POA: Diagnosis not present

## 2016-04-12 DIAGNOSIS — J449 Chronic obstructive pulmonary disease, unspecified: Secondary | ICD-10-CM | POA: Diagnosis not present

## 2016-04-12 DIAGNOSIS — Z87891 Personal history of nicotine dependence: Secondary | ICD-10-CM | POA: Diagnosis not present

## 2016-04-12 DIAGNOSIS — I2510 Atherosclerotic heart disease of native coronary artery without angina pectoris: Secondary | ICD-10-CM | POA: Diagnosis not present

## 2016-04-16 DIAGNOSIS — Z794 Long term (current) use of insulin: Secondary | ICD-10-CM | POA: Diagnosis not present

## 2016-04-16 DIAGNOSIS — J449 Chronic obstructive pulmonary disease, unspecified: Secondary | ICD-10-CM | POA: Diagnosis not present

## 2016-04-16 DIAGNOSIS — Z7982 Long term (current) use of aspirin: Secondary | ICD-10-CM | POA: Diagnosis not present

## 2016-04-16 DIAGNOSIS — I10 Essential (primary) hypertension: Secondary | ICD-10-CM | POA: Diagnosis not present

## 2016-04-16 DIAGNOSIS — F329 Major depressive disorder, single episode, unspecified: Secondary | ICD-10-CM | POA: Diagnosis not present

## 2016-04-16 DIAGNOSIS — I2510 Atherosclerotic heart disease of native coronary artery without angina pectoris: Secondary | ICD-10-CM | POA: Diagnosis not present

## 2016-04-16 DIAGNOSIS — E119 Type 2 diabetes mellitus without complications: Secondary | ICD-10-CM | POA: Diagnosis not present

## 2016-04-16 DIAGNOSIS — Z9181 History of falling: Secondary | ICD-10-CM | POA: Diagnosis not present

## 2016-04-16 DIAGNOSIS — Z87891 Personal history of nicotine dependence: Secondary | ICD-10-CM | POA: Diagnosis not present

## 2016-04-18 ENCOUNTER — Ambulatory Visit (INDEPENDENT_AMBULATORY_CARE_PROVIDER_SITE_OTHER): Payer: Medicare HMO | Admitting: Family Medicine

## 2016-04-18 ENCOUNTER — Encounter: Payer: Self-pay | Admitting: Family Medicine

## 2016-04-18 VITALS — BP 120/70 | HR 72 | Ht 64.25 in | Wt 178.6 lb

## 2016-04-18 DIAGNOSIS — R05 Cough: Secondary | ICD-10-CM

## 2016-04-18 DIAGNOSIS — E559 Vitamin D deficiency, unspecified: Secondary | ICD-10-CM

## 2016-04-18 DIAGNOSIS — I10 Essential (primary) hypertension: Secondary | ICD-10-CM | POA: Diagnosis not present

## 2016-04-18 DIAGNOSIS — Z1211 Encounter for screening for malignant neoplasm of colon: Secondary | ICD-10-CM

## 2016-04-18 DIAGNOSIS — Z23 Encounter for immunization: Secondary | ICD-10-CM | POA: Diagnosis not present

## 2016-04-18 DIAGNOSIS — Z0000 Encounter for general adult medical examination without abnormal findings: Secondary | ICD-10-CM

## 2016-04-18 DIAGNOSIS — J3489 Other specified disorders of nose and nasal sinuses: Secondary | ICD-10-CM | POA: Diagnosis not present

## 2016-04-18 DIAGNOSIS — R809 Proteinuria, unspecified: Secondary | ICD-10-CM

## 2016-04-18 DIAGNOSIS — Z609 Problem related to social environment, unspecified: Secondary | ICD-10-CM | POA: Diagnosis not present

## 2016-04-18 DIAGNOSIS — R059 Cough, unspecified: Secondary | ICD-10-CM

## 2016-04-18 LAB — POCT URINALYSIS DIPSTICK
Bilirubin, UA: NEGATIVE
Blood, UA: NEGATIVE
Ketones, UA: NEGATIVE
Nitrite, UA: NEGATIVE
Protein, UA: NEGATIVE
Spec Grav, UA: 1.025
Urobilinogen, UA: NEGATIVE
pH, UA: 6

## 2016-04-18 MED ORDER — VITAMIN D 1000 UNITS PO TABS: 1000.0000 [IU] | ORAL_TABLET | Freq: Every day | ORAL | 1 refills | Status: DC

## 2016-04-18 MED ORDER — FLUTICASONE PROPIONATE 50 MCG/ACT NA SUSP: 2.0000 | Freq: Every day | NASAL | 6 refills | Status: DC

## 2016-04-18 MED ORDER — BENZONATATE 200 MG PO CAPS: 200.0000 mg | ORAL_CAPSULE | Freq: Two times a day (BID) | ORAL | 0 refills | Status: DC | PRN

## 2016-04-18 NOTE — Progress Notes (Signed)
Courtney Goodman is a 71 y.o. female who presents for annual wellness visit and follow-up on chronic medical conditions.  She has the following concerns:  Complains of dry cough and rhinorrhea for past 3-4 days. Denies headache, sinus pain, nasal congestion, ear pain, sore throat, chest pain, palpitations, shortness of breath, DOE, wheezing, LE edema.   Dr. Loanne Drilling is managing her diabetes.   HTN: she has a home health nurse that comes out once a week to help with medications and blood pressures.  She has an appointment with a psychiatrist.  Stopped prozac and is sleeping better.  Stopped taking iron. Her ferritin level was high.  History of vitamin D deficiency but has not been taking vitamin D  No other concerns today.   Has history of chronic knee pain with injection to her left knee years ago. Occasionally takes tramadol for knee pain.  Acid reflux- takes daily PPI.  Reports good tolerance with cholesterol and BP medication.     Immunization History  Administered Date(s) Administered  . Influenza, High Dose Seasonal PF 11/23/2015  . Pneumococcal Conjugate-13 04/18/2016   Last Pap smear: hysterectomy in her 17s.  Last mammogram: 12/2015 Last colonoscopy: 2008 Last DEXA: October 30/2016 - osteopenia Dentist: years  Ophtho: annually  Exercise: walks daily around her apartment complex.   Other doctors caring for patient include: Dr. Loanne Drilling - endocrinologist.   [facility] Nephrologist- [facility] Podiatrist- Dr. Amalia Hailey Dr. Humphrey Rolls - cardiologist in [place]. Had a stress test. Had a cardiac stent.    Depression screen:  See questionnaire below.  Depression screen [facility] 2/9 04/18/2016 03/12/2016 11/27/2015  Decreased Interest 0 3 1  Down, Depressed, Hopeless 0 3 3  PHQ - 2 Score 0 6 4  Altered sleeping - 1 2  Tired, decreased energy - 3 1  Change in appetite - 1 3  Feeling bad or failure about yourself  - 3 0  Trouble concentrating - 1 0  Moving slowly  or fidgety/restless - 3 0  Suicidal thoughts - 0 0  PHQ-9 Score - 18 10    Fall Risk Screen: see questionnaire below. Fall Risk  04/18/2016 11/27/2015  Falls in the past year? No Yes  Number falls in past yr: - 2 or more  Injury with Fall? - Yes  Risk Factor Category  - High Fall Risk  Risk for fall due to : - History of fall(s);Impaired balance/gait;Impaired mobility;Impaired vision;Medication side effect  Follow up - Follow up appointment    ADL screen:  See questionnaire below Functional Status Survey: Is the patient deaf or have difficulty hearing?: No Does the patient have difficulty seeing, even when wearing glasses/contacts?: No Does the patient have difficulty concentrating, remembering, or making decisions?: No Does the patient have difficulty walking or climbing stairs?: No Does the patient have difficulty dressing or bathing?: No Does the patient have difficulty doing errands alone such as visiting a doctor's office or shopping?: No   End of Life Discussion:  Patient does not have a living will and medical power of attorney. Paperwork given and discussed.   Review of Systems Constitutional: -fever, -chills, -sweats, -unexpected weight change, -anorexia, -fatigue Allergy: -sneezing, -itching, -congestion Dermatology: denies changing moles, rash, lumps, new worrisome lesions ENT: +runny nose, -ear pain, -sore throat, -hoarseness, -sinus pain, -teeth pain, -tinnitus, -hearing loss, -epistaxis Cardiology:  -chest pain, -palpitations, -edema, -orthopnea, -paroxysmal nocturnal dyspnea Respiratory: +cough, -shortness of breath, -dyspnea on exertion, -wheezing, -hemoptysis Gastroenterology: -abdominal pain, -nausea, -vomiting, -diarrhea, -constipation, -  blood in stool, -changes in bowel movement, -dysphagia Hematology: -bleeding or bruising problems Musculoskeletal: -arthralgias, -myalgias, -joint swelling, -back pain, -neck pain, -cramping, -gait changes Ophthalmology: -vision  changes, -eye redness, -itching, -discharge Urology: -dysuria, -difficulty urinating, -hematuria, -urinary frequency, -urgency, incontinence Neurology: -headache, -weakness, -tingling, -numbness, -speech abnormality, -memory loss, -falls, -dizziness Psychology:  -depressed mood, -agitation, -sleep problems   PHYSICAL EXAM:  BP 120/70   Pulse 72   Ht 5' 4.25" (1.632 m)   Wt 178 lb 9.6 oz (81 kg)   BMI 30.42 kg/m   General Appearance: Alert, cooperative, no distress, appears stated age Head: Normocephalic, without obvious abnormality, atraumatic Eyes: PERRL, conjunctiva/corneas clear, EOM's intact, fundi benign Ears: Normal TM's and external ear canals Nose: Nares normal, mucosa erythematous, clear drainage, no sinus tenderness Throat: Lips, mucosa, and tongue normal; teeth and gums normal Neck: Supple, no lymphadenopathy; thyroid: no enlargement/tenderness/nodules; no carotid bruit or JVD Back: Spine nontender, no curvature, ROM normal, no CVA tenderness Lungs: Clear to auscultation bilaterally without wheezes, rales or ronchi; respirations unlabored Chest Wall: No tenderness or deformity Heart: Regular rate and rhythm, S1 and S2 normal, no murmur, rub or gallop Breast Exam: No tenderness, masses, or nipple discharge or inversion. No axillary lymphadenopathy Abdomen: Soft, non-tender, nondistended, normoactive bowel sounds, no masses, no hepatosplenomegaly Genitalia: Deferred Extremities: No clubbing, cyanosis or edema Pulses: 2+ and symmetric all extremities Skin: Skin color, texture, turgor normal, no rashes or lesions Lymph nodes: Cervical, supraclavicular, and axillary nodes normal Neurologic: CNII-XII intact, normal strength, sensation and gait; reflexes 2+ and symmetric throughout Psych: Normal mood, affect, hygiene and grooming.  ASSESSMENT/PLAN: Medicare annual wellness visit, subsequent  Hypertension, unspecified type  Vitamin D deficiency  Screen for colon cancer -  Plan: Ambulatory referral to Gastroenterology  Rhinorrhea  Cough  High risk social situation - Plan: Hepatitis C antibody  Proteinuria, unspecified type - Plan: POCT urinalysis dipstick  Need for vaccination against Streptococcus pneumoniae - Plan: Pneumococcal conjugate vaccine 13-valent  UA- spec grav 1.025, neg protein, gluc 2+ prevnar 13 given Hepatitis C one time screening done. Acute URI- Flonase and Benzonate prescriptions given to patient. She will call if she gets worse or not improving.  Start taking vitamin D.  She has a home health nurse who will continue helping her with medications and blood pressure checks.  BP today is within goal range.  Eye exam up to date will get documentation.  Foot exam done.  GERD- discussed trying to take days off from the PPI and see how she does.  GI referral for colonoscopy. Has been 10 years. Last done in [place].  Return advance directives.  She will be due for DEXA in November 2018. History of osteopenia.    Discussed monthly self breast exams and yearly mammograms; at least 30 minutes of aerobic activity at least 5 days/week and weight-bearing exercise 2x/week; proper sunscreen use reviewed; healthy diet, including goals of calcium and vitamin D intake and alcohol recommendations (less than or equal to 1 drink/day) reviewed; regular seatbelt use; changing batteries in smoke detectors.  Immunization recommendations discussed.  Colonoscopy recommendations reviewed   Medicare Attestation I have personally reviewed: The patient's medical and social history Their use of alcohol, tobacco or illicit drugs Their current medications and supplements The patient's functional ability including ADLs,fall risks, home safety risks, cognitive, and hearing and visual impairment Diet and physical activities Evidence for depression or mood disorders  The patient's weight, height, and BMI have been recorded in the chart.  I have made  referrals,  counseling, and provided education to the patient based on review of the above and I have provided the patient with a written personalized care plan for preventive services.     [name], NP-C   04/18/2016

## 2016-04-18 NOTE — Patient Instructions (Addendum)
Take vitamin D- 1,000 International units daily.   The GI office will call you to schedule an appointment for a colonoscopy.   See the psychiatrist as discussed.   Follow up with your specialists as scheduled.   Call your insurance and see how much the Shingles vaccine (Shingrix) will be for you and let me know if you decide to get it.

## 2016-04-19 ENCOUNTER — Encounter: Payer: Self-pay | Admitting: Internal Medicine

## 2016-04-19 DIAGNOSIS — Z794 Long term (current) use of insulin: Secondary | ICD-10-CM | POA: Diagnosis not present

## 2016-04-19 DIAGNOSIS — J449 Chronic obstructive pulmonary disease, unspecified: Secondary | ICD-10-CM | POA: Diagnosis not present

## 2016-04-19 DIAGNOSIS — F329 Major depressive disorder, single episode, unspecified: Secondary | ICD-10-CM | POA: Diagnosis not present

## 2016-04-19 DIAGNOSIS — Z7982 Long term (current) use of aspirin: Secondary | ICD-10-CM | POA: Diagnosis not present

## 2016-04-19 DIAGNOSIS — Z9181 History of falling: Secondary | ICD-10-CM | POA: Diagnosis not present

## 2016-04-19 DIAGNOSIS — I2510 Atherosclerotic heart disease of native coronary artery without angina pectoris: Secondary | ICD-10-CM | POA: Diagnosis not present

## 2016-04-19 DIAGNOSIS — E119 Type 2 diabetes mellitus without complications: Secondary | ICD-10-CM | POA: Diagnosis not present

## 2016-04-19 DIAGNOSIS — I10 Essential (primary) hypertension: Secondary | ICD-10-CM | POA: Diagnosis not present

## 2016-04-19 DIAGNOSIS — Z87891 Personal history of nicotine dependence: Secondary | ICD-10-CM | POA: Diagnosis not present

## 2016-04-19 LAB — HEPATITIS C ANTIBODY: HCV Ab: NEGATIVE

## 2016-04-23 DIAGNOSIS — J449 Chronic obstructive pulmonary disease, unspecified: Secondary | ICD-10-CM | POA: Diagnosis not present

## 2016-04-23 DIAGNOSIS — I2510 Atherosclerotic heart disease of native coronary artery without angina pectoris: Secondary | ICD-10-CM | POA: Diagnosis not present

## 2016-04-23 DIAGNOSIS — F329 Major depressive disorder, single episode, unspecified: Secondary | ICD-10-CM | POA: Diagnosis not present

## 2016-04-23 DIAGNOSIS — Z87891 Personal history of nicotine dependence: Secondary | ICD-10-CM | POA: Diagnosis not present

## 2016-04-23 DIAGNOSIS — Z7982 Long term (current) use of aspirin: Secondary | ICD-10-CM | POA: Diagnosis not present

## 2016-04-23 DIAGNOSIS — E119 Type 2 diabetes mellitus without complications: Secondary | ICD-10-CM | POA: Diagnosis not present

## 2016-04-23 DIAGNOSIS — Z9181 History of falling: Secondary | ICD-10-CM | POA: Diagnosis not present

## 2016-04-23 DIAGNOSIS — Z794 Long term (current) use of insulin: Secondary | ICD-10-CM | POA: Diagnosis not present

## 2016-04-23 DIAGNOSIS — I10 Essential (primary) hypertension: Secondary | ICD-10-CM | POA: Diagnosis not present

## 2016-04-25 DIAGNOSIS — E119 Type 2 diabetes mellitus without complications: Secondary | ICD-10-CM | POA: Diagnosis not present

## 2016-04-25 DIAGNOSIS — Z7982 Long term (current) use of aspirin: Secondary | ICD-10-CM | POA: Diagnosis not present

## 2016-04-25 DIAGNOSIS — F329 Major depressive disorder, single episode, unspecified: Secondary | ICD-10-CM | POA: Diagnosis not present

## 2016-04-25 DIAGNOSIS — Z87891 Personal history of nicotine dependence: Secondary | ICD-10-CM | POA: Diagnosis not present

## 2016-04-25 DIAGNOSIS — J449 Chronic obstructive pulmonary disease, unspecified: Secondary | ICD-10-CM | POA: Diagnosis not present

## 2016-04-25 DIAGNOSIS — I10 Essential (primary) hypertension: Secondary | ICD-10-CM | POA: Diagnosis not present

## 2016-04-25 DIAGNOSIS — I2510 Atherosclerotic heart disease of native coronary artery without angina pectoris: Secondary | ICD-10-CM | POA: Diagnosis not present

## 2016-04-25 DIAGNOSIS — Z9181 History of falling: Secondary | ICD-10-CM | POA: Diagnosis not present

## 2016-04-25 DIAGNOSIS — Z794 Long term (current) use of insulin: Secondary | ICD-10-CM | POA: Diagnosis not present

## 2016-05-02 DIAGNOSIS — Z794 Long term (current) use of insulin: Secondary | ICD-10-CM | POA: Diagnosis not present

## 2016-05-02 DIAGNOSIS — F329 Major depressive disorder, single episode, unspecified: Secondary | ICD-10-CM | POA: Diagnosis not present

## 2016-05-02 DIAGNOSIS — J449 Chronic obstructive pulmonary disease, unspecified: Secondary | ICD-10-CM | POA: Diagnosis not present

## 2016-05-02 DIAGNOSIS — Z7982 Long term (current) use of aspirin: Secondary | ICD-10-CM | POA: Diagnosis not present

## 2016-05-02 DIAGNOSIS — I2510 Atherosclerotic heart disease of native coronary artery without angina pectoris: Secondary | ICD-10-CM | POA: Diagnosis not present

## 2016-05-02 DIAGNOSIS — E119 Type 2 diabetes mellitus without complications: Secondary | ICD-10-CM | POA: Diagnosis not present

## 2016-05-02 DIAGNOSIS — Z87891 Personal history of nicotine dependence: Secondary | ICD-10-CM | POA: Diagnosis not present

## 2016-05-02 DIAGNOSIS — I10 Essential (primary) hypertension: Secondary | ICD-10-CM | POA: Diagnosis not present

## 2016-05-02 DIAGNOSIS — Z9181 History of falling: Secondary | ICD-10-CM | POA: Diagnosis not present

## 2016-05-07 DIAGNOSIS — J449 Chronic obstructive pulmonary disease, unspecified: Secondary | ICD-10-CM | POA: Diagnosis not present

## 2016-05-07 DIAGNOSIS — Z9181 History of falling: Secondary | ICD-10-CM | POA: Diagnosis not present

## 2016-05-07 DIAGNOSIS — F329 Major depressive disorder, single episode, unspecified: Secondary | ICD-10-CM | POA: Diagnosis not present

## 2016-05-07 DIAGNOSIS — I10 Essential (primary) hypertension: Secondary | ICD-10-CM | POA: Diagnosis not present

## 2016-05-07 DIAGNOSIS — Z87891 Personal history of nicotine dependence: Secondary | ICD-10-CM | POA: Diagnosis not present

## 2016-05-07 DIAGNOSIS — Z7982 Long term (current) use of aspirin: Secondary | ICD-10-CM | POA: Diagnosis not present

## 2016-05-07 DIAGNOSIS — Z794 Long term (current) use of insulin: Secondary | ICD-10-CM | POA: Diagnosis not present

## 2016-05-07 DIAGNOSIS — I2510 Atherosclerotic heart disease of native coronary artery without angina pectoris: Secondary | ICD-10-CM | POA: Diagnosis not present

## 2016-05-07 DIAGNOSIS — E119 Type 2 diabetes mellitus without complications: Secondary | ICD-10-CM | POA: Diagnosis not present

## 2016-05-09 DIAGNOSIS — F329 Major depressive disorder, single episode, unspecified: Secondary | ICD-10-CM | POA: Diagnosis not present

## 2016-05-09 DIAGNOSIS — Z9181 History of falling: Secondary | ICD-10-CM | POA: Diagnosis not present

## 2016-05-09 DIAGNOSIS — J449 Chronic obstructive pulmonary disease, unspecified: Secondary | ICD-10-CM | POA: Diagnosis not present

## 2016-05-09 DIAGNOSIS — E119 Type 2 diabetes mellitus without complications: Secondary | ICD-10-CM | POA: Diagnosis not present

## 2016-05-09 DIAGNOSIS — Z87891 Personal history of nicotine dependence: Secondary | ICD-10-CM | POA: Diagnosis not present

## 2016-05-09 DIAGNOSIS — Z7982 Long term (current) use of aspirin: Secondary | ICD-10-CM | POA: Diagnosis not present

## 2016-05-09 DIAGNOSIS — Z794 Long term (current) use of insulin: Secondary | ICD-10-CM | POA: Diagnosis not present

## 2016-05-09 DIAGNOSIS — I2510 Atherosclerotic heart disease of native coronary artery without angina pectoris: Secondary | ICD-10-CM | POA: Diagnosis not present

## 2016-05-09 DIAGNOSIS — I10 Essential (primary) hypertension: Secondary | ICD-10-CM | POA: Diagnosis not present

## 2016-05-10 ENCOUNTER — Other Ambulatory Visit: Payer: Self-pay | Admitting: Endocrinology

## 2016-05-14 ENCOUNTER — Telehealth: Payer: Self-pay | Admitting: Endocrinology

## 2016-05-14 MED ORDER — INSULIN DETEMIR 100 UNIT/ML FLEXPEN: 140.0000 [IU] | PEN_INJECTOR | SUBCUTANEOUS | 11 refills | Status: DC

## 2016-05-14 NOTE — Telephone Encounter (Signed)
Refill submitted. 

## 2016-05-14 NOTE — Telephone Encounter (Signed)
Pt needs her Levemir Pens refilled and sent to [facility] on [address].

## 2016-05-16 DIAGNOSIS — F329 Major depressive disorder, single episode, unspecified: Secondary | ICD-10-CM | POA: Diagnosis not present

## 2016-05-16 DIAGNOSIS — Z794 Long term (current) use of insulin: Secondary | ICD-10-CM | POA: Diagnosis not present

## 2016-05-16 DIAGNOSIS — I10 Essential (primary) hypertension: Secondary | ICD-10-CM | POA: Diagnosis not present

## 2016-05-16 DIAGNOSIS — E119 Type 2 diabetes mellitus without complications: Secondary | ICD-10-CM | POA: Diagnosis not present

## 2016-05-16 DIAGNOSIS — Z7982 Long term (current) use of aspirin: Secondary | ICD-10-CM | POA: Diagnosis not present

## 2016-05-16 DIAGNOSIS — Z9181 History of falling: Secondary | ICD-10-CM | POA: Diagnosis not present

## 2016-05-16 DIAGNOSIS — I2510 Atherosclerotic heart disease of native coronary artery without angina pectoris: Secondary | ICD-10-CM | POA: Diagnosis not present

## 2016-05-16 DIAGNOSIS — Z87891 Personal history of nicotine dependence: Secondary | ICD-10-CM | POA: Diagnosis not present

## 2016-05-16 DIAGNOSIS — J449 Chronic obstructive pulmonary disease, unspecified: Secondary | ICD-10-CM | POA: Diagnosis not present

## 2016-05-17 ENCOUNTER — Encounter: Payer: Self-pay | Admitting: Family Medicine

## 2016-05-20 ENCOUNTER — Encounter: Payer: Self-pay | Admitting: Family Medicine

## 2016-05-23 ENCOUNTER — Encounter: Payer: Self-pay | Admitting: Family Medicine

## 2016-05-24 ENCOUNTER — Encounter: Payer: Self-pay | Admitting: Family Medicine

## 2016-05-27 ENCOUNTER — Encounter: Payer: Self-pay | Admitting: Family Medicine

## 2016-05-27 ENCOUNTER — Telehealth: Payer: Self-pay

## 2016-05-27 ENCOUNTER — Telehealth: Payer: Self-pay | Admitting: Endocrinology

## 2016-05-27 DIAGNOSIS — F339 Major depressive disorder, recurrent, unspecified: Secondary | ICD-10-CM | POA: Insufficient documentation

## 2016-05-27 DIAGNOSIS — I2510 Atherosclerotic heart disease of native coronary artery without angina pectoris: Secondary | ICD-10-CM | POA: Insufficient documentation

## 2016-05-27 DIAGNOSIS — F411 Generalized anxiety disorder: Secondary | ICD-10-CM | POA: Insufficient documentation

## 2016-05-27 DIAGNOSIS — E1169 Type 2 diabetes mellitus with other specified complication: Secondary | ICD-10-CM | POA: Insufficient documentation

## 2016-05-27 DIAGNOSIS — N3946 Mixed incontinence: Secondary | ICD-10-CM | POA: Insufficient documentation

## 2016-05-27 DIAGNOSIS — N189 Chronic kidney disease, unspecified: Secondary | ICD-10-CM | POA: Insufficient documentation

## 2016-05-27 DIAGNOSIS — D638 Anemia in other chronic diseases classified elsewhere: Secondary | ICD-10-CM | POA: Insufficient documentation

## 2016-05-27 DIAGNOSIS — D631 Anemia in chronic kidney disease: Secondary | ICD-10-CM | POA: Insufficient documentation

## 2016-05-27 DIAGNOSIS — E782 Mixed hyperlipidemia: Secondary | ICD-10-CM

## 2016-05-27 MED ORDER — AMLODIPINE BESY-BENAZEPRIL HCL 5-20 MG PO CAPS: ORAL_CAPSULE | ORAL | 0 refills | Status: DC

## 2016-05-27 MED ORDER — FENOFIBRATE 145 MG PO TABS: 145.0000 mg | ORAL_TABLET | Freq: Every day | ORAL | 0 refills | Status: DC

## 2016-05-27 MED ORDER — SIMVASTATIN 20 MG PO TABS: 20.0000 mg | ORAL_TABLET | Freq: Every day | ORAL | 0 refills | Status: DC

## 2016-05-27 NOTE — Telephone Encounter (Signed)
error 

## 2016-05-27 NOTE — Telephone Encounter (Signed)
Refilled 3 out of 4 meds. Called and left detailed message on pt's vm that we can not fill victoza since she sees an Endocrinology and she will need to get that from them.

## 2016-05-27 NOTE — Telephone Encounter (Signed)
Pt needs lotrel, victoza, simvastatin, and fenofibrate sent to [facility] on [address]. She states they had issues with power outages and lost some scripts. Please send 30 day supply. Victorino December

## 2016-05-28 ENCOUNTER — Encounter: Payer: Self-pay | Admitting: Family Medicine

## 2016-05-30 ENCOUNTER — Telehealth: Payer: Self-pay | Admitting: Family Medicine

## 2016-05-30 MED ORDER — VITAMIN D 1000 UNITS PO TABS: 1000.0000 [IU] | ORAL_TABLET | Freq: Every day | ORAL | 2 refills | Status: DC

## 2016-05-30 NOTE — Telephone Encounter (Signed)
Sent in Vitamin D 1000 units to pharmacy

## 2016-05-30 NOTE — Telephone Encounter (Signed)
Pt called for refills of Vitamin D. Please send to [facility] on [address]. Pt can be reached at [phone].

## 2016-06-03 ENCOUNTER — Telehealth: Payer: Self-pay | Admitting: Family Medicine

## 2016-06-03 ENCOUNTER — Telehealth: Payer: Self-pay | Admitting: Endocrinology

## 2016-06-03 MED ORDER — LIRAGLUTIDE 18 MG/3ML ~~LOC~~ SOPN: PEN_INJECTOR | SUBCUTANEOUS | 1 refills | Status: DC

## 2016-06-03 MED ORDER — LIRAGLUTIDE 18 MG/3ML ~~LOC~~ SOPN
PEN_INJECTOR | SUBCUTANEOUS | 1 refills | Status: DC
Start: 2016-06-03 — End: 2016-06-03

## 2016-06-03 MED ORDER — CARVEDILOL 6.25 MG PO TABS: 6.2500 mg | ORAL_TABLET | Freq: Two times a day (BID) | ORAL | 0 refills | Status: DC

## 2016-06-03 NOTE — Telephone Encounter (Signed)
Her regular pharmacy is out of power and cannot deliver her meds.  She needs Victoza and Carvedilol sent to the [facility] on [address] [phone]

## 2016-06-03 NOTE — Telephone Encounter (Signed)
Refill submitted. 

## 2016-06-03 NOTE — Telephone Encounter (Signed)
Pt left VM regarding her medicine called her back told her call us back

## 2016-06-03 NOTE — Telephone Encounter (Signed)
Pt needs her Victoza sent into the [facility] on [address].

## 2016-06-03 NOTE — Telephone Encounter (Signed)
Pt does not GET her DIABETES MEDS anymore from [place]. She has been advised to Contact ENDOCRINOLOGY for any of her Diabetes meds. I have refilled the bp med for her.

## 2016-06-06 ENCOUNTER — Ambulatory Visit: Payer: Medicare HMO | Admitting: Endocrinology

## 2016-06-14 ENCOUNTER — Encounter: Payer: Self-pay | Admitting: Endocrinology

## 2016-06-14 ENCOUNTER — Ambulatory Visit (INDEPENDENT_AMBULATORY_CARE_PROVIDER_SITE_OTHER): Payer: Medicare HMO | Admitting: Endocrinology

## 2016-06-14 VITALS — BP 122/78 | HR 76 | Wt 178.8 lb

## 2016-06-14 DIAGNOSIS — Z794 Long term (current) use of insulin: Secondary | ICD-10-CM | POA: Diagnosis not present

## 2016-06-14 DIAGNOSIS — E1122 Type 2 diabetes mellitus with diabetic chronic kidney disease: Secondary | ICD-10-CM | POA: Diagnosis not present

## 2016-06-14 DIAGNOSIS — N183 Chronic kidney disease, stage 3 (moderate): Secondary | ICD-10-CM | POA: Diagnosis not present

## 2016-06-14 LAB — POCT GLYCOSYLATED HEMOGLOBIN (HGB A1C): Hemoglobin A1C: 8.7

## 2016-06-14 MED ORDER — INSULIN DETEMIR 100 UNIT/ML FLEXPEN: 170.0000 [IU] | PEN_INJECTOR | SUBCUTANEOUS | 11 refills | Status: DC

## 2016-06-14 NOTE — Patient Instructions (Addendum)
check your blood sugar twice a day.  vary the time of day when you check, between before the 3 meals, and at bedtime.  also check if you have symptoms of your blood sugar being too high or too low.  please keep a record of the readings and bring it to your next appointment here (or you can bring the meter itself).  You can write it on any piece of paper.  please call us sooner if your blood sugar goes below 70, or if you have a lot of readings over 200.   please increase the levemir to 170 units each morning.   On this type of insulin schedule, you should eat meals on a regular schedule.  If a meal is missed or significantly delayed, your blood sugar could go low.  Please come back for a follow-up appointment in 2 months.

## 2016-06-14 NOTE — Progress Notes (Signed)
Subjective:    Patient ID: Courtney Goodman, female    DOB: May 07, 1945, 71 y.o.   MRN: [id number]  HPI Pt returns for f/u of diabetes mellitus: DM type: Insulin-requiring type 2 Dx'ed: 2992 Complications: renal insufficiency and CAD Therapy: insulin since 2009, and victoza. GDM: never DKA: never Severe hypoglycemia: once (2012) Pancreatitis: never.  Other: she is on qd insulin, due to h/o noncompliance.   Interval history: no cbg record, but states cbg's vary from 83-200. It is in general higher as the day goes on.  pt states she feels well in general.  She takes 150 units qam.   Past Medical History:  Diagnosis Date  . Anemia of chronic disease   . Asthma   . Atherosclerotic heart disease of native coronary artery without angina pectoris   . COPD (chronic obstructive pulmonary disease) ([place])   . DM (diabetes mellitus), type 2 with renal complications ([place])   . Dysrhythmia   . GAD (generalized anxiety disorder)   . Hypertension   . Mixed hyperlipidemia due to type 2 diabetes mellitus ([place])   . Mixed incontinence    per medical records from [facility]  . Osteopenia   . Personal history of noncompliance with medical treatment, presenting hazards to health   . Shortness of breath dyspnea   . TB (tuberculosis), treated    age 31  . Vitamin D deficiency     Past Surgical History:  Procedure Laterality Date  . CORONARY ANGIOPLASTY      Social History   Social History  . Marital status: Divorced    Spouse name: N/A  . Number of children: N/A  . Years of education: N/A   Occupational History  . Not on file.   Social History Main Topics  . Smoking status: Former Research scientist (life sciences)  . Smokeless tobacco: Never Used  . Alcohol use No  . Drug use: No  . Sexual activity: No   Other Topics Concern  . Not on file   Social History Narrative  . No narrative on file    Current Outpatient Prescriptions on File Prior to Visit  Medication Sig Dispense Refill  . ACCU-CHEK AVIVA PLUS test  strip USE AS DIRECTED TO CHECK BLOOD SUGAR THREE TIMES A DAY 300 each 1  . ACCU-CHEK SOFTCLIX LANCETS lancets Test 3-4 times a day    . amLODipine-benazepril (LOTREL) 5-20 MG capsule TAKE 1 CAPSULE BY MOUTH EVERY DAY 30 capsule 0  . aspirin EC 81 MG tablet Take 81 mg by mouth every morning.     . benzonatate (TESSALON) 200 MG capsule Take 1 capsule (200 mg total) by mouth 2 (two) times daily as needed for cough. 20 capsule 0  . carvedilol (COREG) 6.25 MG tablet Take 1 tablet (6.25 mg total) by mouth 2 (two) times daily. 180 tablet 0  . cholecalciferol (VITAMIN D) 1000 units tablet Take 1 tablet (1,000 Units total) by mouth daily. 30 tablet 2  . fenofibrate (TRICOR) 145 MG tablet Take 1 tablet (145 mg total) by mouth daily. 30 tablet 0  . ferrous sulfate 325 (65 FE) MG tablet TAKE 1 TABLET BY MOUTH EVERY DAY FOR IRON DEFICIENCY 90 tablet 0  . fluticasone (FLONASE) 50 MCG/ACT nasal spray Place 2 sprays into both nostrils daily. 16 g 6  . liraglutide (VICTOZA) 18 MG/3ML SOPN INJECT 1.8MG  EVERY MORNING 27 pen 1  . omeprazole (PRILOSEC) 40 MG capsule Take 40 mg by mouth daily.    . simvastatin (ZOCOR) 20 MG tablet Take  1 tablet (20 mg total) by mouth at bedtime. 30 tablet 0  . traMADol (ULTRAM) 50 MG tablet TAKE 1 TABLET BY MOUTH TWICE DAILY AS NEEDED 180 tablet 0   No current facility-administered medications on file prior to visit.     No Known Allergies  Family History  Problem Relation Age of Onset  . Diabetes Mother     BP 122/78 (BP Location: Left Arm, Patient Position: Sitting)   Pulse 76   Wt 178 lb 12.8 oz (81.1 kg)   SpO2 98%   BMI 30.45 kg/m    Review of Systems She denies hypoglycemia.      Objective:   Physical Exam VITAL SIGNS:  See vs page GENERAL: no distress Pulses: dorsalis pedis intact bilat.   MSK: no deformity of the feet.  CV: no leg edema.  Skin:  no ulcer on the feet, but the skin is dry.  normal color and temp on the feet.  Old healed surgical scars on  the right ankle and foot Neuro: sensation is intact to touch on the feet Ext: There is bilateral onychomycosis of the toenails.   Lab Results  Component Value Date   HGBA1C 8.7 06/14/2016      Assessment & Plan:  Insulin-requiring type 2 DM: he needs increased rx.   Patient Instructions  check your blood sugar twice a day.  vary the time of day when you check, between before the 3 meals, and at bedtime.  also check if you have symptoms of your blood sugar being too high or too low.  please keep a record of the readings and bring it to your next appointment here (or you can bring the meter itself).  You can write it on any piece of paper.  please call us sooner if your blood sugar goes below 70, or if you have a lot of readings over 200.   please increase the levemir to 170 units each morning.   On this type of insulin schedule, you should eat meals on a regular schedule.  If a meal is missed or significantly delayed, your blood sugar could go low.  Please come back for a follow-up appointment in 2 months.

## 2016-06-20 ENCOUNTER — Encounter (HOSPITAL_COMMUNITY): Payer: Self-pay

## 2016-06-20 ENCOUNTER — Emergency Department (HOSPITAL_COMMUNITY)
Admission: EM | Admit: 2016-06-20 | Discharge: 2016-06-21 | Disposition: A | Discharge status: Other Acute Inpatient Hospital | Payer: Medicare HMO | Attending: Emergency Medicine | Admitting: Emergency Medicine

## 2016-06-20 DIAGNOSIS — J449 Chronic obstructive pulmonary disease, unspecified: Secondary | ICD-10-CM | POA: Diagnosis not present

## 2016-06-20 DIAGNOSIS — R45851 Suicidal ideations: Secondary | ICD-10-CM | POA: Insufficient documentation

## 2016-06-20 DIAGNOSIS — F29 Unspecified psychosis not due to a substance or known physiological condition: Secondary | ICD-10-CM | POA: Diagnosis not present

## 2016-06-20 DIAGNOSIS — Z955 Presence of coronary angioplasty implant and graft: Secondary | ICD-10-CM | POA: Insufficient documentation

## 2016-06-20 DIAGNOSIS — Z7982 Long term (current) use of aspirin: Secondary | ICD-10-CM | POA: Insufficient documentation

## 2016-06-20 DIAGNOSIS — E1122 Type 2 diabetes mellitus with diabetic chronic kidney disease: Secondary | ICD-10-CM | POA: Diagnosis not present

## 2016-06-20 DIAGNOSIS — R45850 Homicidal ideations: Secondary | ICD-10-CM | POA: Insufficient documentation

## 2016-06-20 DIAGNOSIS — Z79899 Other long term (current) drug therapy: Secondary | ICD-10-CM | POA: Insufficient documentation

## 2016-06-20 DIAGNOSIS — I129 Hypertensive chronic kidney disease with stage 1 through stage 4 chronic kidney disease, or unspecified chronic kidney disease: Secondary | ICD-10-CM | POA: Insufficient documentation

## 2016-06-20 DIAGNOSIS — I2510 Atherosclerotic heart disease of native coronary artery without angina pectoris: Secondary | ICD-10-CM | POA: Insufficient documentation

## 2016-06-20 DIAGNOSIS — F329 Major depressive disorder, single episode, unspecified: Secondary | ICD-10-CM | POA: Diagnosis present

## 2016-06-20 DIAGNOSIS — Z6982 Encounter for mental health services for perpetrator of other abuse: Secondary | ICD-10-CM

## 2016-06-20 DIAGNOSIS — Z87891 Personal history of nicotine dependence: Secondary | ICD-10-CM | POA: Diagnosis not present

## 2016-06-20 DIAGNOSIS — N189 Chronic kidney disease, unspecified: Secondary | ICD-10-CM | POA: Insufficient documentation

## 2016-06-20 DIAGNOSIS — Z794 Long term (current) use of insulin: Secondary | ICD-10-CM | POA: Diagnosis not present

## 2016-06-20 DIAGNOSIS — Z6981 Encounter for mental health services for victim of other abuse: Secondary | ICD-10-CM | POA: Insufficient documentation

## 2016-06-20 LAB — CBC
HCT: 30.7 % — ABNORMAL LOW (ref 36.0–46.0)
Hemoglobin: 10 g/dL — ABNORMAL LOW (ref 12.0–15.0)
MCH: 29.3 pg (ref 26.0–34.0)
MCHC: 32.6 g/dL (ref 30.0–36.0)
MCV: 90 fL (ref 78.0–100.0)
Platelets: 327 10*3/uL (ref 150–400)
RBC: 3.41 MIL/uL — ABNORMAL LOW (ref 3.87–5.11)
RDW: 14.7 % (ref 11.5–15.5)
WBC: 8 10*3/uL (ref 4.0–10.5)

## 2016-06-20 MED ORDER — FERROUS SULFATE 325 (65 FE) MG PO TABS
325.0000 mg | ORAL_TABLET | Freq: Every day | ORAL | Status: DC
  Administered 2016-06-21: 325 mg via ORAL
  Filled 2016-06-20: qty 1

## 2016-06-20 MED ORDER — INSULIN DETEMIR 100 UNIT/ML ~~LOC~~ SOLN
170.0000 [IU] | Freq: Every day | SUBCUTANEOUS | Status: DC
  Administered 2016-06-21: 170 [IU] via SUBCUTANEOUS
  Filled 2016-06-20: qty 1.7

## 2016-06-20 MED ORDER — PANTOPRAZOLE SODIUM 40 MG PO TBEC
40.0000 mg | DELAYED_RELEASE_TABLET | Freq: Every day | ORAL | Status: DC
  Administered 2016-06-21: 40 mg via ORAL
  Filled 2016-06-20 (×2): qty 1

## 2016-06-20 MED ORDER — AMLODIPINE BESY-BENAZEPRIL HCL 5-20 MG PO CAPS: 1.0000 | ORAL_CAPSULE | Freq: Every day | ORAL | Status: DC

## 2016-06-20 MED ORDER — SIMVASTATIN 20 MG PO TABS
20.0000 mg | ORAL_TABLET | Freq: Every day | ORAL | Status: DC
  Administered 2016-06-21: 20 mg via ORAL
  Filled 2016-06-20: qty 1

## 2016-06-20 MED ORDER — LIRAGLUTIDE 18 MG/3ML ~~LOC~~ SOPN: 1.8000 mg | PEN_INJECTOR | Freq: Every morning | SUBCUTANEOUS | Status: DC

## 2016-06-20 MED ORDER — CARVEDILOL 12.5 MG PO TABS
6.2500 mg | ORAL_TABLET | Freq: Two times a day (BID) | ORAL | Status: DC
  Administered 2016-06-21 (×2): 6.25 mg via ORAL
  Filled 2016-06-20 (×2): qty 1

## 2016-06-20 NOTE — ED Notes (Signed)
Staffing called for sitter. Security also called to come wand patient. Pt changed into paper scrubs.
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
## 2016-06-21 DIAGNOSIS — R45851 Suicidal ideations: Secondary | ICD-10-CM | POA: Diagnosis not present

## 2016-06-21 DIAGNOSIS — J449 Chronic obstructive pulmonary disease, unspecified: Secondary | ICD-10-CM | POA: Diagnosis not present

## 2016-06-21 DIAGNOSIS — I129 Hypertensive chronic kidney disease with stage 1 through stage 4 chronic kidney disease, or unspecified chronic kidney disease: Secondary | ICD-10-CM | POA: Diagnosis not present

## 2016-06-21 DIAGNOSIS — E1122 Type 2 diabetes mellitus with diabetic chronic kidney disease: Secondary | ICD-10-CM | POA: Diagnosis not present

## 2016-06-21 DIAGNOSIS — R45850 Homicidal ideations: Secondary | ICD-10-CM | POA: Diagnosis not present

## 2016-06-21 DIAGNOSIS — E1129 Type 2 diabetes mellitus with other diabetic kidney complication: Secondary | ICD-10-CM | POA: Diagnosis not present

## 2016-06-21 DIAGNOSIS — I10 Essential (primary) hypertension: Secondary | ICD-10-CM | POA: Diagnosis not present

## 2016-06-21 DIAGNOSIS — F411 Generalized anxiety disorder: Secondary | ICD-10-CM | POA: Diagnosis not present

## 2016-06-21 DIAGNOSIS — E119 Type 2 diabetes mellitus without complications: Secondary | ICD-10-CM | POA: Diagnosis not present

## 2016-06-21 DIAGNOSIS — E11319 Type 2 diabetes mellitus with unspecified diabetic retinopathy without macular edema: Secondary | ICD-10-CM | POA: Diagnosis not present

## 2016-06-21 DIAGNOSIS — N189 Chronic kidney disease, unspecified: Secondary | ICD-10-CM | POA: Diagnosis not present

## 2016-06-21 DIAGNOSIS — F29 Unspecified psychosis not due to a substance or known physiological condition: Secondary | ICD-10-CM | POA: Diagnosis not present

## 2016-06-21 DIAGNOSIS — E1165 Type 2 diabetes mellitus with hyperglycemia: Secondary | ICD-10-CM | POA: Diagnosis not present

## 2016-06-21 DIAGNOSIS — F332 Major depressive disorder, recurrent severe without psychotic features: Secondary | ICD-10-CM | POA: Diagnosis not present

## 2016-06-21 DIAGNOSIS — I2510 Atherosclerotic heart disease of native coronary artery without angina pectoris: Secondary | ICD-10-CM | POA: Diagnosis not present

## 2016-06-21 DIAGNOSIS — D638 Anemia in other chronic diseases classified elsewhere: Secondary | ICD-10-CM | POA: Diagnosis not present

## 2016-06-21 DIAGNOSIS — Z79899 Other long term (current) drug therapy: Secondary | ICD-10-CM | POA: Diagnosis not present

## 2016-06-21 LAB — COMPREHENSIVE METABOLIC PANEL
ALT: 10 U/L — ABNORMAL LOW (ref 14–54)
AST: 18 U/L (ref 15–41)
Albumin: 3.7 g/dL (ref 3.5–5.0)
Alkaline Phosphatase: 57 U/L (ref 38–126)
Anion gap: 9 (ref 5–15)
BUN: 23 mg/dL — ABNORMAL HIGH (ref 6–20)
CO2: 23 mmol/L (ref 22–32)
Calcium: 9.7 mg/dL (ref 8.9–10.3)
Chloride: 108 mmol/L (ref 101–111)
Creatinine, Ser: 1.55 mg/dL — ABNORMAL HIGH (ref 0.44–1.00)
GFR calc Af Amer: 38 mL/min — ABNORMAL LOW (ref >60)
GFR calc non Af Amer: 33 mL/min — ABNORMAL LOW (ref >60)
Glucose, Bld: 109 mg/dL — ABNORMAL HIGH (ref 65–99)
Potassium: 3.5 mmol/L (ref 3.5–5.1)
Sodium: 140 mmol/L (ref 135–145)
Total Bilirubin: 0.4 mg/dL (ref 0.3–1.2)
Total Protein: 7.6 g/dL (ref 6.5–8.1)

## 2016-06-21 LAB — ETHANOL: Alcohol, Ethyl (B): <5 mg/dL (ref <5)

## 2016-06-21 LAB — ACETAMINOPHEN LEVEL: Acetaminophen (Tylenol), Serum: <10 ug/mL — ABNORMAL LOW (ref 10–30)

## 2016-06-21 LAB — RAPID URINE DRUG SCREEN, HOSP PERFORMED
Amphetamines: NOT DETECTED
Barbiturates: NOT DETECTED
Benzodiazepines: NOT DETECTED
Cocaine: NOT DETECTED
Opiates: NOT DETECTED
Tetrahydrocannabinol: NOT DETECTED

## 2016-06-21 LAB — CBG MONITORING, ED
Glucose-Capillary: 240 mg/dL — ABNORMAL HIGH (ref 65–99)
Glucose-Capillary: 387 mg/dL — ABNORMAL HIGH (ref 65–99)
Glucose-Capillary: 395 mg/dL — ABNORMAL HIGH (ref 65–99)

## 2016-06-21 LAB — SALICYLATE LEVEL: Salicylate Lvl: <7 mg/dL (ref 2.8–30.0)

## 2016-06-21 MED ORDER — LIRAGLUTIDE 18 MG/3ML ~~LOC~~ SOPN
1.8000 mg | PEN_INJECTOR | Freq: Every morning | SUBCUTANEOUS | Status: DC
  Administered 2016-06-21: 1.8 mg via SUBCUTANEOUS

## 2016-06-21 MED ORDER — ZOLPIDEM TARTRATE 5 MG PO TABS: 5.0000 mg | ORAL_TABLET | Freq: Every evening | ORAL | Status: DC | PRN

## 2016-06-21 MED ORDER — AMLODIPINE BESYLATE 5 MG PO TABS
5.0000 mg | ORAL_TABLET | Freq: Every day | ORAL | Status: DC
  Filled 2016-06-21: qty 1

## 2016-06-21 MED ORDER — ONDANSETRON HCL 4 MG PO TABS
4.0000 mg | ORAL_TABLET | Freq: Three times a day (TID) | ORAL | Status: DC | PRN
  Administered 2016-06-21: 4 mg via ORAL
  Filled 2016-06-21: qty 1

## 2016-06-21 MED ORDER — ACETAMINOPHEN 325 MG PO TABS: 650.0000 mg | ORAL_TABLET | ORAL | Status: DC | PRN

## 2016-06-21 MED ORDER — BENAZEPRIL HCL 40 MG PO TABS
20.0000 mg | ORAL_TABLET | Freq: Every day | ORAL | Status: DC
  Administered 2016-06-21: 20 mg via ORAL
  Filled 2016-06-21: qty 1

## 2016-06-21 NOTE — ED Notes (Signed)
Pt is refusing to go to [facility] due to being worried about her family.  Per [facility] if the patient does not go voluntarily she will have to be IVC'd, EDP Dr. Tamera Punt at bedside talking to patient along with daughter about going for treatment.  Will reassess after the patient has talked with her family and get the IVC paperwork in process if need be.

## 2016-06-21 NOTE — ED Notes (Signed)
[facility] notified the patient was on the way

## 2016-06-21 NOTE — B[facility] Counselor (Signed)
Pt has been accepted to [facility] by Dr. Ricard Dillon. Number for report is [phone].   Assessing for Pt's willingness to go voluntarily.  RN aware.  Adriana Reams, LCSW Clinical Social Work [phone]

## 2016-06-21 NOTE — BHH Counselor (Signed)
Pt meets criteria for inpatient admission; CSW faxed referral packet to the following facilities in attempts to secure inpatient bed:   [facility]  TTS will continue to seek placement.   Adriana Reams, LCSW Clinical Social Work [phone]

## 2016-06-21 NOTE — ED Notes (Signed)
Pt's belongings given to pt's daughter to take home.

## 2016-06-21 NOTE — ED Notes (Signed)
Family remains at bedside due to the patients anxiety on being transferred far away. Will continue to monitor

## 2016-06-21 NOTE — ED Notes (Signed)
Gave pt.a snack
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
## 2016-06-21 NOTE — ED Provider Notes (Signed)
Pt has been accepted by Dr. Ricard Dillon at [facility], MD 06/21/16 [phone]

## 2016-06-27 ENCOUNTER — Telehealth: Payer: Self-pay | Admitting: Endocrinology

## 2016-06-27 NOTE — Telephone Encounter (Signed)
2:30 PM, 07/02/16.  Please note this is for DM, not mental health problems.

## 2016-06-27 NOTE — Telephone Encounter (Signed)
Patient needs a hospital follow up as soon as possible. Patient is scheduled July 3rd at 1:15pm. Please advise.

## 2016-07-01 ENCOUNTER — Ambulatory Visit (INDEPENDENT_AMBULATORY_CARE_PROVIDER_SITE_OTHER): Payer: Medicare HMO | Admitting: Family Medicine

## 2016-07-01 ENCOUNTER — Telehealth: Payer: Self-pay | Admitting: Family Medicine

## 2016-07-01 ENCOUNTER — Other Ambulatory Visit: Payer: Self-pay

## 2016-07-01 ENCOUNTER — Inpatient Hospital Stay: Payer: Medicare HMO | Admitting: Family Medicine

## 2016-07-01 ENCOUNTER — Encounter: Payer: Self-pay | Admitting: Family Medicine

## 2016-07-01 VITALS — BP 120/60 | HR 68 | Wt 180.8 lb

## 2016-07-01 DIAGNOSIS — N183 Chronic kidney disease, stage 3 (moderate): Secondary | ICD-10-CM | POA: Diagnosis not present

## 2016-07-01 DIAGNOSIS — I10 Essential (primary) hypertension: Secondary | ICD-10-CM | POA: Diagnosis not present

## 2016-07-01 DIAGNOSIS — Z09 Encounter for follow-up examination after completed treatment for conditions other than malignant neoplasm: Secondary | ICD-10-CM

## 2016-07-01 DIAGNOSIS — F411 Generalized anxiety disorder: Secondary | ICD-10-CM

## 2016-07-01 DIAGNOSIS — F333 Major depressive disorder, recurrent, severe with psychotic symptoms: Secondary | ICD-10-CM

## 2016-07-01 DIAGNOSIS — E1122 Type 2 diabetes mellitus with diabetic chronic kidney disease: Secondary | ICD-10-CM

## 2016-07-01 DIAGNOSIS — Z794 Long term (current) use of insulin: Secondary | ICD-10-CM

## 2016-07-01 LAB — CBC WITH DIFFERENTIAL/PLATELET
Basophils Absolute: 86 cells/uL (ref 0–200)
Basophils Relative: 1 %
Eosinophils Absolute: 172 cells/uL (ref 15–500)
Eosinophils Relative: 2 %
HCT: 33.3 % — ABNORMAL LOW (ref 35.0–45.0)
Hemoglobin: 10.5 g/dL — ABNORMAL LOW (ref 11.7–15.5)
Lymphocytes Relative: 20 %
Lymphs Abs: 1720 cells/uL (ref 850–3900)
MCH: 29.1 pg (ref 27.0–33.0)
MCHC: 31.5 g/dL — ABNORMAL LOW (ref 32.0–36.0)
MCV: 92.2 fL (ref 80.0–100.0)
MPV: 10.5 fL (ref 7.5–12.5)
Monocytes Absolute: 602 cells/uL (ref 200–950)
Monocytes Relative: 7 %
Neutro Abs: 6020 cells/uL (ref 1500–7800)
Neutrophils Relative %: 70 %
Platelets: 393 10*3/uL (ref 140–400)
RBC: 3.61 MIL/uL — ABNORMAL LOW (ref 3.80–5.10)
RDW: 14.8 % (ref 11.0–15.0)
WBC: 8.6 10*3/uL (ref 4.0–10.5)

## 2016-07-01 LAB — POCT URINALYSIS DIPSTICK
Bilirubin, UA: NEGATIVE
Blood, UA: NEGATIVE
Ketones, UA: NEGATIVE
Nitrite, UA: NEGATIVE
Protein, UA: NEGATIVE
Spec Grav, UA: 1.025 (ref 1.010–1.025)
Urobilinogen, UA: NEGATIVE E.U./dL — AB
pH, UA: 6 (ref 5.0–8.0)

## 2016-07-01 LAB — GLUCOSE, POCT (MANUAL RESULT ENTRY): POC Glucose: 340 mg/dl — AB (ref 70–99)

## 2016-07-01 MED ORDER — FENOFIBRATE 145 MG PO TABS: 145.0000 mg | ORAL_TABLET | Freq: Every day | ORAL | 0 refills | Status: DC

## 2016-07-01 MED ORDER — AMLODIPINE BESY-BENAZEPRIL HCL 5-20 MG PO CAPS: ORAL_CAPSULE | ORAL | 0 refills | Status: DC

## 2016-07-01 MED ORDER — SIMVASTATIN 20 MG PO TABS: 20.0000 mg | ORAL_TABLET | Freq: Every day | ORAL | 0 refills | Status: DC

## 2016-07-01 MED ORDER — CARVEDILOL 6.25 MG PO TABS
6.2500 mg | ORAL_TABLET | Freq: Two times a day (BID) | ORAL | 0 refills | Status: DC
Start: 2016-07-01 — End: 2017-03-03

## 2016-07-01 MED ORDER — FERROUS SULFATE 325 (65 FE) MG PO TABS: ORAL_TABLET | ORAL | 0 refills | Status: DC

## 2016-07-01 NOTE — Telephone Encounter (Signed)
Recv'd fax from [place] stating pt's new pharmacy & need new prescription for Simvastatin &  Folic  Acid and states it takes about 2 weeks for pt to receive first Rx.  Called pt & she does wants all Rx sent to new pharmacy

## 2016-07-01 NOTE — Telephone Encounter (Signed)
Sent meds in to new pharmacy

## 2016-07-01 NOTE — Telephone Encounter (Signed)
Please take care of this for her 

## 2016-07-01 NOTE — Patient Instructions (Addendum)
Take the medications as instructed. You have an appointment with Dr. Loanne Drilling tomorrow at 2 pm. This is for your diabetes.   This evening take Levemir 50 units. You will take it again in the morning and then see your endocrinologist in the afternoon.  Stop Novolog for now.  Take the Humolog 8 units 3 times daily with meals as prescribed by [facility] until you see Dr. Loanne Drilling tomorrow.   Check your blood sugar breakfast, lunch, supper, bedtime. Keep a record and take these in to Dr. Loanne Drilling.   We will send someone out to help you with your medications since you did not bring these in today.   Return to see me Thursday. Bring in all of your medications.

## 2016-07-01 NOTE — Progress Notes (Signed)
This encounter was created in error - please disregard.

## 2016-07-01 NOTE — Progress Notes (Signed)
   Subjective:    Patient ID: Courtney Goodman, female    DOB: 1945/12/02, 71 y.o.   MRN: [id number]  HPI Chief Complaint  Patient presents with  . hospital follow-p    hospital follow-up on depression. having trouble with her insulin as well since meds were changed in hospital   She is here for a hospital follow up. She was admitted to [facility] in [place] for suicidal ideations and hallucinations and discharged home last Friday.  Several of her medications were changed upon discharge. She has a medication list with her and several written prescriptions but no discharge notes. No records in the EMR.  She sees Dr. Loanne Drilling for DM management. Has a follow up scheduled for July 3rd. States she is confused about her medications. States her insulin and doses were changed.  States she was told to stop Victoza but she has continued taking it. Novolog was dc'ed and switched to Humalog 8 units TID. Levemir dose reduced to 50 units bid.  She lives alone.  States she is no longer having suicidal thoughts. States she feels confused and tired.   She was told to start Celexa 20 mg and has not yet taken the prescription to the pharmacy. She has never seen a psychiatrist and has not called to schedule with anyone. She is willing to see someone.    Denies fever, chills, headache, vision changes, dizziness, weakness, chest pain, palpitations, shortness of breath, abdominal pain, N/V/D, or urinary symptoms.   Reviewed allergies, medications, past medical, surgical, and social history.   Review of Systems Pertinent positives and negatives in the history of present illness.     Objective:   Physical Exam BP 120/60   Pulse 68   Wt 180 lb 12.8 oz (82 kg)   BMI 31.03 kg/m  Alert and in no distress.  Pharyngeal area is normal. Neck is supple without adenopathy or thyromegaly. Cardiac exam shows a regular sinus rhythm without murmurs or gallops. Lungs are clear to auscultation. Denies SI or HI.        Assessment & Plan:  Hospital discharge follow-up - Plan: CBC with Differential/Platelet, Comprehensive metabolic panel  Hypertension, unspecified type  Severe episode of recurrent major depressive disorder, with psychotic features (HCC)  GAD (generalized anxiety disorder)  Type 2 diabetes mellitus with stage 3 chronic kidney disease, with long-term current use of insulin (HCC) - Plan: CBC with Differential/Platelet, Comprehensive metabolic panel, POCT urinalysis dipstick, POCT glucose (manual entry)  She is not suicidal or homicidal today. Hemodynamically stable. Exam unremarkable.  Urine dipstick - spec grav 1.025, leuk 1+,glu 2+, otherwise neg.  Random CBG- 340. She ate prior to arrival. Does not appear symptomatic.  She took Victoza this morning. She also took Levemir 50 units. Attempted to clarify medications. She will take Levemir 50 units this evening and twice daily going forward unless Dr. Loanne Drilling changes this. She has an appointment tomorrow afternoon with him.  She will also Humalog 8 units tid and keep a close eye on her blood sugars.  Encouraged her to take her BS readings to her appointment tomorrow with Dr. Loanne Drilling.  [facility]- she called to schedule an appointment with them while in the office. They will call her back to schedule.   Follow up with me in 3 days. Asked her to bring in all medications.   [facility] will send home health out to help her with medications.
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
## 2016-07-01 NOTE — Addendum Note (Signed)
Addended by: Virgel Manifold on: 07/01/2016 02:53 PM   Modules accepted: Level of Service, SmartSet

## 2016-07-02 ENCOUNTER — Telehealth: Payer: Self-pay

## 2016-07-02 ENCOUNTER — Ambulatory Visit: Payer: Medicare HMO | Admitting: Podiatry

## 2016-07-02 ENCOUNTER — Encounter: Payer: Self-pay | Admitting: Endocrinology

## 2016-07-02 ENCOUNTER — Ambulatory Visit (INDEPENDENT_AMBULATORY_CARE_PROVIDER_SITE_OTHER): Payer: Medicare HMO | Admitting: Endocrinology

## 2016-07-02 ENCOUNTER — Other Ambulatory Visit: Payer: Self-pay | Admitting: Licensed Clinical Social Worker

## 2016-07-02 VITALS — BP 132/78 | HR 72 | Ht 64.0 in | Wt 179.2 lb

## 2016-07-02 DIAGNOSIS — N183 Chronic kidney disease, stage 3 (moderate): Secondary | ICD-10-CM

## 2016-07-02 DIAGNOSIS — E1122 Type 2 diabetes mellitus with diabetic chronic kidney disease: Secondary | ICD-10-CM | POA: Diagnosis not present

## 2016-07-02 DIAGNOSIS — Z794 Long term (current) use of insulin: Secondary | ICD-10-CM | POA: Diagnosis not present

## 2016-07-02 LAB — COMPREHENSIVE METABOLIC PANEL
ALT: 10 U/L (ref 6–29)
AST: 13 U/L (ref 10–35)
Albumin: 3.8 g/dL (ref 3.6–5.1)
Alkaline Phosphatase: 59 U/L (ref 33–130)
BUN: 30 mg/dL — ABNORMAL HIGH (ref 7–25)
CO2: 25 mmol/L (ref 20–31)
Calcium: 9.8 mg/dL (ref 8.6–10.4)
Chloride: 102 mmol/L (ref 98–110)
Creat: 1.48 mg/dL — ABNORMAL HIGH (ref 0.60–0.93)
Glucose, Bld: 304 mg/dL — ABNORMAL HIGH (ref 65–99)
Potassium: 4.4 mmol/L (ref 3.5–5.3)
Sodium: 137 mmol/L (ref 135–146)
Total Bilirubin: 0.4 mg/dL (ref 0.2–1.2)
Total Protein: 7.5 g/dL (ref 6.1–8.1)

## 2016-07-02 MED ORDER — INSULIN DETEMIR 100 UNIT/ML FLEXPEN: 170.0000 [IU] | PEN_INJECTOR | SUBCUTANEOUS | 11 refills | Status: DC

## 2016-07-02 NOTE — Progress Notes (Signed)
Subjective:    Patient ID: Courtney Goodman, female    DOB: 11-23-1945, 71 y.o.   MRN: [id number]  HPI Pt returns for f/u of diabetes mellitus: DM type: Insulin-requiring type 2 Dx'ed: 3875 Complications: renal insufficiency and CAD Therapy: insulin since 2009, and victoza. GDM: never DKA: never Severe hypoglycemia: once (2012). Pancreatitis: never.  Other: she is on qd insulin, due to h/o noncompliance.   Interval history: While recently in psych hospital, insulin was changed to multiple daily injections.  However, she takes levemir 50 units BID, but does not take the humalog.  no cbg record, but states cbg's are over 300.  She says when levemir was 170 units qam, cbg's were in the 100's.   Past Medical History:  Diagnosis Date  . Anemia of chronic disease   . Asthma   . Atherosclerotic heart disease of native coronary artery without angina pectoris   . COPD (chronic obstructive pulmonary disease) ([place])   . DM (diabetes mellitus), type 2 with renal complications ([place])   . Dysrhythmia   . GAD (generalized anxiety disorder)   . Hypertension   . Mixed hyperlipidemia due to type 2 diabetes mellitus ([place])   . Mixed incontinence    per medical records from [place]  . Osteopenia   . Personal history of noncompliance with medical treatment, presenting hazards to health   . Shortness of breath dyspnea   . Suicidal ideation   . TB (tuberculosis), treated    age 75  . Vitamin D deficiency     Past Surgical History:  Procedure Laterality Date  . ANKLE RECONSTRUCTION     right  . CARPAL TUNNEL RELEASE    . CORONARY ANGIOPLASTY      Social History   Social History  . Marital status: Divorced    Spouse name: N/A  . Number of children: N/A  . Years of education: N/A   Occupational History  . Not on file.   Social History Main Topics  . Smoking status: Former Research scientist (life sciences)  . Smokeless tobacco: Never Used  . Alcohol use No  . Drug use: No  . Sexual activity: No   Other Topics  Concern  . Not on file   Social History Narrative  . No narrative on file    Current Outpatient Prescriptions on File Prior to Visit  Medication Sig Dispense Refill  . ACCU-CHEK AVIVA PLUS test strip USE AS DIRECTED TO CHECK BLOOD SUGAR THREE TIMES A DAY 300 each 1  . ACCU-CHEK SOFTCLIX LANCETS lancets Test 3-4 times a day    . amLODipine-benazepril (LOTREL) 5-20 MG capsule TAKE 1 CAPSULE BY MOUTH EVERY DAY 90 capsule 0  . aspirin EC 81 MG tablet Take 81 mg by mouth every morning.     . carvedilol (COREG) 6.25 MG tablet Take 1 tablet (6.25 mg total) by mouth 2 (two) times daily. 180 tablet 0  . citalopram (CELEXA) 20 MG tablet Take 20 mg by mouth daily.    . fenofibrate (TRICOR) 145 MG tablet Take 1 tablet (145 mg total) by mouth daily. 90 tablet 0  . ferrous sulfate 325 (65 FE) MG tablet TAKE 1 TABLET BY MOUTH EVERY DAY FOR IRON DEFICIENCY 90 tablet 0  . fluticasone (FLONASE) 50 MCG/ACT nasal spray Place 2 sprays into both nostrils daily. 16 g 6  . omeprazole (PRILOSEC) 40 MG capsule Take 40 mg by mouth daily.    . simvastatin (ZOCOR) 20 MG tablet Take 1 tablet (20 mg total) by mouth  at bedtime. 90 tablet 0  . traMADol (ULTRAM) 50 MG tablet TAKE 1 TABLET BY MOUTH TWICE DAILY AS NEEDED 180 tablet 0  . cholecalciferol (VITAMIN D) 1000 units tablet Take 1 tablet (1,000 Units total) by mouth daily. (Patient not taking: Reported on 07/02/2016) 30 tablet 2   No current facility-administered medications on file prior to visit.     No Known Allergies  Family History  Problem Relation Age of Onset  . Diabetes Mother     BP 132/78 (BP Location: Left Arm, Patient Position: Sitting, Cuff Size: Normal)   Pulse 72   Ht 5\' 4"  (1.626 m)   Wt 179 lb 3.2 oz (81.3 kg)   SpO2 98%   BMI 30.76 kg/m    Review of Systems She denies hypoglycemia.      Objective:   Physical Exam VITAL SIGNS:  See vs page GENERAL: no distress Pulses: dorsalis pedis intact bilat.   MSK: no deformity of the feet.   CV: no leg edema.  Skin:  no ulcer on the feet, but the skin is dry.  normal color and temp on the feet.  Old healed surgical scars on the right ankle and foot Neuro: sensation is intact to touch on the feet Ext: There is bilateral onychomycosis of the toenails.      Assessment & Plan:  Insulin-requiring type 2 DM, with CAD: worse.  Severe depression: this complicates the rx of DM.  She is not a candidate for multiple daily injections.  Patient Instructions  check your blood sugar twice a day.  vary the time of day when you check, between before the 3 meals, and at bedtime.  also check if you have symptoms of your blood sugar being too high or too low.  please keep a record of the readings and bring it to your next appointment here (or you can bring the meter itself).  You can write it on any piece of paper.  please call us sooner if your blood sugar goes below 70, or if you have a lot of readings over 200.   please increase the levemir back to 170 units each morning.   You don't need to take the humalog.  On this type of insulin schedule, you should eat meals on a regular schedule.  If a meal is missed or significantly delayed, your blood sugar could go low.  Please come back for a follow-up appointment in 3 months.

## 2016-07-02 NOTE — Telephone Encounter (Signed)
Thank you :)

## 2016-07-02 NOTE — Patient Outreach (Signed)
[facility]) Care Management  07/02/2016  Brookelyn Gaynor Stonesifer May 04, 1945 [id number]  Assessment- CSW received new referral on 07/01/16 for advance directives and mental health resource support. CSW completed outreach call on 07/02/16 and was able to reach her successfully. HIPPA verifications were provided. CSW introduced self, reason for call and of [facility] social work services. At first, patient reported that she just wanted CSW to mail out advance directives and mental health resources but after building rapport with patient, she was agreeable to home visit on 07/04/16. She shares that she moved from [place] to [place] back in 2017 to be closer to family. She shares that is able to drive and drives her daughter's car while she is at work so that she can run errands, go to physician appointments and visit friends and family. She reports having a very strong social support network of family members and friends. She uses [facility] to get back and forth to appointments. Patient experienced suicidal ideations on 06/20/16 and went to [facility] in [place] for inpatient psychiatric treatment. She attributes this to stress related to issues with her homeless grandson and some incidents that they have had with one another. She shares that she feels "a lot better" since inpatient treatment but states that she is "somewhat overwhelmed with my health issues." She shares that she is "getting set up" with a psychiatrist in [place] as she has not had an outpatient psychiatrist since she relocated to [place]. Patient reports that her physician wants her to take an antidepressant that starts with a "C" and that she is going to pick up medication today. Patient is unsure if this medication is celexa. Patient is agreeable to home visit on 07/04/16. CSW will send involvement letter to PCP.  Plan-CSW will complete home visit this week and provide review of mental health resources and  will assist her with completing advance directives. CSW will send involvement letter to PCP.  [facility] CM Care Plan Problem One     Most Recent Value  Care Plan Problem One  Lack of overall support system and desire to complete advance directives  Role Documenting the Problem One  Clinical Social Worker  Care Plan for Problem One  Active  [facility] CM Short Term Goal #1   Patient will gain appropriate mental health resource information within 30 days  [facility] CM Short Term Goal #1 Start Date  07/02/16  Interventions for Short Term Goal #1  CSW will complete home visit, provide emotional support and review mental health resources and will encourage patient to follow up with mental health needs  [facility] CM Short Term Goal #2   Patient will complete advance directives within 30 days  [facility] CM Short Term Goal #2 Start Date  07/02/16  Interventions for Short Term Goal #2  CSW will complete home visit and will provide her with a copy of advance directives and assist her with completing document. Patient will get document notarized when she is able to and CSW will instruct her on the following steps that she will need to complete.     [name], BSW, MSW, [email or web address] Phone: [phone] Fax: [phone]

## 2016-07-02 NOTE — Patient Instructions (Addendum)
check your blood sugar twice a day.  vary the time of day when you check, between before the 3 meals, and at bedtime.  also check if you have symptoms of your blood sugar being too high or too low.  please keep a record of the readings and bring it to your next appointment here (or you can bring the meter itself).  You can write it on any piece of paper.  please call us sooner if your blood sugar goes below 70, or if you have a lot of readings over 200.   please increase the levemir back to 170 units each morning.   You don't need to take the humalog.  On this type of insulin schedule, you should eat meals on a regular schedule.  If a meal is missed or significantly delayed, your blood sugar could go low.  Please come back for a follow-up appointment in 3 months.

## 2016-07-02 NOTE — Telephone Encounter (Signed)
Therapist, music at [place] Nonclinical Telephone Record [place] Client [place] [facility] [place] Client Site [facility] [place] Type Call Who Is Calling Patient / Member / Family / Caregiver Caller Name Jaylie Neaves Phone Number [phone] Patient Name Orlando Thalmann Call Type Message Only Information Provided Reason for Call Request to Reschedule Office Appointment Initial Comment Caller states has an appt tomorrow June 5th Tuesday scheduled for 2pm that she is needing to reschedule. Additional Comment Call Closed By: Delton Coombes Transaction Date/Time: 07/01/2016 5:07:16 PM (

## 2016-07-03 ENCOUNTER — Inpatient Hospital Stay: Payer: Medicare HMO | Admitting: Family Medicine

## 2016-07-04 ENCOUNTER — Other Ambulatory Visit: Payer: Self-pay | Admitting: Licensed Clinical Social Worker

## 2016-07-04 ENCOUNTER — Ambulatory Visit (INDEPENDENT_AMBULATORY_CARE_PROVIDER_SITE_OTHER): Payer: Medicare HMO | Admitting: Family Medicine

## 2016-07-04 ENCOUNTER — Other Ambulatory Visit: Payer: Self-pay

## 2016-07-04 ENCOUNTER — Other Ambulatory Visit: Payer: Self-pay | Admitting: Endocrinology

## 2016-07-04 ENCOUNTER — Encounter: Payer: Self-pay | Admitting: Family Medicine

## 2016-07-04 VITALS — BP 120/70 | HR 69

## 2016-07-04 DIAGNOSIS — F333 Major depressive disorder, recurrent, severe with psychotic symptoms: Secondary | ICD-10-CM | POA: Diagnosis not present

## 2016-07-04 DIAGNOSIS — F411 Generalized anxiety disorder: Secondary | ICD-10-CM

## 2016-07-04 MED ORDER — ACCU-CHEK AVIVA PLUS W/DEVICE KIT: PACK | 0 refills | Status: DC

## 2016-07-04 MED ORDER — INSULIN DETEMIR 100 UNIT/ML FLEXPEN: PEN_INJECTOR | SUBCUTANEOUS | 1 refills | Status: DC

## 2016-07-04 NOTE — Patient Outreach (Signed)
[facility]) Care Management  [facility] Social Work  07/04/2016  Courtney Goodman 1945/04/20 [id number]   Encounter Medications:  Outpatient Encounter Prescriptions as of 07/04/2016  Medication Sig  . ACCU-CHEK AVIVA PLUS test strip USE AS DIRECTED TO CHECK BLOOD SUGAR THREE TIMES A DAY  . ACCU-CHEK SOFTCLIX LANCETS lancets Test 3-4 times a day  . amLODipine-benazepril (LOTREL) 5-20 MG capsule TAKE 1 CAPSULE BY MOUTH EVERY DAY  . aspirin EC 81 MG tablet Take 81 mg by mouth every morning.   . Biotin 10000 MCG TABS Take 1 tablet by mouth every morning.  . carvedilol (COREG) 6.25 MG tablet Take 1 tablet (6.25 mg total) by mouth 2 (two) times daily.  . cholecalciferol (VITAMIN D) 1000 units tablet Take 1 tablet (1,000 Units total) by mouth daily. (Patient not taking: Reported on 07/02/2016)  . citalopram (CELEXA) 20 MG tablet Take 20 mg by mouth daily.  . fenofibrate (TRICOR) 145 MG tablet Take 1 tablet (145 mg total) by mouth daily.  . ferrous sulfate 325 (65 FE) MG tablet TAKE 1 TABLET BY MOUTH EVERY DAY FOR IRON DEFICIENCY  . fluticasone (FLONASE) 50 MCG/ACT nasal spray Place 2 sprays into both nostrils daily.  . Insulin Detemir (LEVEMIR FLEXPEN) 100 UNIT/ML Pen Inject 170 Units into the skin every morning. And pen needles 3/day  . omeprazole (PRILOSEC) 40 MG capsule Take 40 mg by mouth daily.  . simvastatin (ZOCOR) 20 MG tablet Take 1 tablet (20 mg total) by mouth at bedtime.  . traMADol (ULTRAM) 50 MG tablet TAKE 1 TABLET BY MOUTH TWICE DAILY AS NEEDED   No facility-administered encounter medications on file as of 07/04/2016.     Functional Status:  In your present state of health, do you have any difficulty performing the following activities: 06/21/2016 04/18/2016  Hearing? N N  Vision? N N  Difficulty concentrating or making decisions? N N  Walking or climbing stairs? N N  Dressing or bathing? N N  Doing errands, shopping? - Scientist, forensic and eating ? - -  Using the  Toilet? - -  In the past six months, have you accidently leaked urine? - -  Do you have problems with loss of bowel control? - -  Managing your Medications? - -  Managing your Finances? - -  Housekeeping or managing your Housekeeping? - -  Some recent data might be hidden    Fall/Depression Screening:  PHQ 2/9 Scores 07/02/2016 07/01/2016 04/18/2016 03/12/2016 11/27/2015  PHQ - 2 Score 2 2 0 6 4  PHQ- 9 Score 4 4 - 18 10    Assessment: CSW completed initial home visit with patient on 07/04/16. Patient is a very pleasant divorced 71 year old female that moved to [place] from [place] in 2017. Patient is very independent with ADLs and IADLS. Patient has recurrent Major Depressive Disorder and presented to ED on 06/20/16 with suicidal ideation. She went to [facility] in [place] for inpatient psych treatment and reports that she feels "so much better" since returning home. She denies any suicidal or homicidal ideations. She denies any depressive symptoms at this time. She states "I'm feeling okay mentally. I just have a lot going on with my health that I need to get figured out." Patient has PCP appointment today as well. Patient shares that she was experiencing suicidal ideations due to conflicts she had with her her homeless grandson who she feels is abusing substances. Patient reports that she is no longer in contact  with him and is relieved because of this distance. Patient reports that she has a VERY strong family support network which includes: daughters, nieces, sister in law, grandchildren, brother and sisters. She reports that she travels often and "lets my family member spoil me." She shares that she goes on cruises yearly and that she will be going [place] and [place] with her brother in August of this year. She shares that most of her family members reside in [place] but they often visit her. Patient also reports that she will be going to [facility] in July with a friend. CSW  spent a lot of time providing reflective listening as patient showed CSW pictures of all of her family members as well as pictures of when she has traveled. CSW provided patient with an extensive list of mental health resources which include [facility] support groups and [facility] information. Patient denies needing these resources at this time but is thankful for this information. She states "My doctor has already figured that out for me. I'm going to [facility] soon." Patient shares that she will be seeing Noemi [facility]. Patient will hold on to mental health resources in case she needs them in the future. However, patient seems to be doing very well and is able to lean on her support network and implement appropriate coping methods if faced with depressive symptoms. Patient reports that her main coping skills include: going shopping, crafting, collecting elephant items, going to church and traveling.   Patient desires to complete advance directives today. CSW provided patient with advance directives document and assisted her with completing it. Patient shares that she will like to take document to her bank to get notarized and will do so within the next few weeks.   CSW provided [employer] as well and completed review. Patient denies needing any other community resource support at this time but is appreciative of visit today.   Plan: CSW will follow up within 30 days and will route encounter to PCP.  [facility] CM Care Plan Problem One     Most Recent Value  Care Plan Problem One  Lack of overall support system and desire to complete advance directives  Role Documenting the Problem One  Clinical Social Worker  Care Plan for Problem One  Active  [facility] CM Short Term Goal #1   Patient will gain appropriate mental health resource information within 30 days  [facility] CM Short Term Goal #1 Start Date  07/02/16  Interventions for Short Term Goal #1  Mental  health resources provided during home visit today and will be reviewed again during other outreaches.  [facility] CM Short Term Goal #2   Patient will complete advance directives within 30 days  [facility] CM Short Term Goal #2 Start Date  07/02/16  Interventions for Short Term Goal #2  Advance directives were completed today during home visit but document will need to be notarized     [name], BSW, MSW, [email or web address] Phone: [phone] Fax: [phone]

## 2016-07-04 NOTE — Progress Notes (Signed)
   Subjective:    Patient ID: Courtney Goodman, female    DOB: 05-02-1945, 71 y.o.   MRN: [id number]  HPI Chief Complaint  Patient presents with  . follow-up    follow-up. needs refills on fenofibrate, amloipidine-benazpril, simvastatin   She is here for a follow up on chronic health conditions. States she is feeling much better today in regards to her diabetes and mental health. She had a visit with Dr. Loanne Drilling for her diabetes and she feels better about her medications.    [facility] -patient outreach has been in touch with her and is helping her with advance directives and other needs.    States she has not started taking Celexa yet but did get it filled. She does not have an appointment with a psychiatrist yet.   States she knows that she needs to have her daughter and grandson move out of her house for her mental sanity and she plans to do this.  No other concerns today.   Denies dizziness, chest pain, shortness of breath, urinary symptoms.     Review of Systems Pertinent positives and negatives in the history of present illness.     Objective:   Physical Exam BP 120/70   Pulse 69   Alert and oriented and in no acute distress.       Assessment & Plan:  Severe episode of recurrent major depressive disorder, with psychotic features (HCC)  GAD (generalized anxiety disorder)  She appears alert and less confused today. She reports feeling better about her diabetes management.  She denies feeling depressed today but I am concerned that she does not have an appointment with a psychiatrist at this point. Gabriel Cirri, CMA, assisted patient with calling [facility] to schedule an appointment. She had to leave a voice mail as she did earlier this week.  [facility] is also assisting patient with advance directives and other community services including options for mental health care.  She agrees to call me after securing an appointment with psychiatry.  She does not appear to be in any danger  to herself or others and states "you don't have to worry, I'm doing better".  Will follow up in 2 weeks.

## 2016-07-05 ENCOUNTER — Telehealth: Payer: Self-pay

## 2016-07-05 ENCOUNTER — Other Ambulatory Visit: Payer: Self-pay

## 2016-07-05 NOTE — Telephone Encounter (Signed)
Appt July 6th @ 10:30 with [facility] off [facility]. She just wanted me to send this to you as Juluis Rainier. Victorino December

## 2016-07-05 NOTE — Patient Outreach (Signed)
[facility]) Care Management  07/05/2016   Courtney Goodman [birth year] [id number]  Subjective:  I have a lot going on right now.  Objective:  Telephonic encounter  Current Medications:  Current Outpatient Prescriptions  Medication Sig Dispense Refill  . ACCU-CHEK AVIVA PLUS test strip USE AS DIRECTED TO CHECK BLOOD SUGAR THREE TIMES A DAY 300 each 1  . ACCU-CHEK SOFTCLIX LANCETS lancets Test 3-4 times a day    . amLODipine-benazepril (LOTREL) 5-20 MG capsule TAKE 1 CAPSULE BY MOUTH EVERY DAY 90 capsule 0  . aspirin EC 81 MG tablet Take 81 mg by mouth every morning.     . Biotin 10000 MCG TABS Take 1 tablet by mouth every morning.    . Blood Glucose Monitoring Suppl (ACCU-CHEK AVIVA PLUS) w/Device KIT Test twice a day. Dx code E11.9 1 kit 0  . carvedilol (COREG) 6.25 MG tablet Take 1 tablet (6.25 mg total) by mouth 2 (two) times daily. 180 tablet 0  . cholecalciferol (VITAMIN D) 1000 units tablet Take 1 tablet (1,000 Units total) by mouth daily. 30 tablet 2  . citalopram (CELEXA) 20 MG tablet Take 20 mg by mouth daily.    . fenofibrate (TRICOR) 145 MG tablet Take 1 tablet (145 mg total) by mouth daily. 90 tablet 0  . ferrous sulfate 325 (65 FE) MG tablet TAKE 1 TABLET BY MOUTH EVERY DAY FOR IRON DEFICIENCY 90 tablet 0  . fluticasone (FLONASE) 50 MCG/ACT nasal spray Place 2 sprays into both nostrils daily. 16 g 6  . Insulin Detemir (LEVEMIR FLEXTOUCH) 100 UNIT/ML Pen INJECT 150 UNITS SUBCUTANEOUSLY EVERY MORNING (Patient taking differently: 170 Units. INJECT 150 UNITS SUBCUTANEOUSLY EVERY MORNING) 45 mL 1  . omeprazole (PRILOSEC) 40 MG capsule Take 40 mg by mouth daily.    . simvastatin (ZOCOR) 20 MG tablet Take 1 tablet (20 mg total) by mouth at bedtime. (Patient not taking: Reported on 07/05/2016) 90 tablet 0  . traMADol (ULTRAM) 50 MG tablet TAKE 1 TABLET BY MOUTH TWICE DAILY AS NEEDED 180 tablet 0   No current facility-administered medications for this visit.      Functional Status:  In your present state of health, do you have any difficulty performing the following activities: 07/05/2016 06/21/2016  Hearing? N N  Vision? Y N  Difficulty concentrating or making decisions? N N  Walking or climbing stairs? Y N  Dressing or bathing? N N  Doing errands, shopping? Y -  Conservation officer, nature and eating ? N -  Using the Toilet? N -  In the past six months, have you accidently leaked urine? N -  Do you have problems with loss of bowel control? N -  Managing your Medications? N -  Managing your Finances? N -  Housekeeping or managing your Housekeeping? N -  Some recent data might be hidden    Fall/Depression Screening: Fall Risk  07/05/2016 07/01/2016 04/18/2016  Falls in the past year? Yes Yes No  Number falls in past yr: 2 or more 2 or more -  Injury with Fall? No No -  Risk Factor Category  High Fall Risk High Fall Risk -  Risk for fall due to : History of fall(s);Impaired balance/gait;Impaired mobility;Impaired vision;Medication side effect History of fall(s);Impaired balance/gait;Impaired mobility;Medication side effect;Mental status change -  Follow up Falls prevention discussed;Follow up appointment Falls evaluation completed;Education provided -   [facility] 2/9 Scores 07/02/2016 07/01/2016 04/18/2016 03/12/2016 11/27/2015  PHQ - 2 Score 2 2 0 6 4  PHQ- 9  Score 4 4 - 18 10    Fall Risk  07/05/2016 07/01/2016 04/18/2016 11/27/2015  Falls in the past year? Yes Yes No Yes  Number falls in past yr: 2 or more 2 or more - 2 or more  Injury with Fall? No No - Yes  Risk Factor Category  High Fall Risk High Fall Risk - High Fall Risk  Risk for fall due to : History of fall(s);Impaired balance/gait;Impaired mobility;Impaired vision;Medication side effect History of fall(s);Impaired balance/gait;Impaired mobility;Medication side effect;Mental status change - History of fall(s);Impaired balance/gait;Impaired mobility;Impaired vision;Medication side effect  Follow up Falls  prevention discussed;Follow up appointment Falls evaluation completed;Education provided - Follow up appointment   [facility] CM Care Plan Problem One     Most Recent Value  Care Plan Problem One  patient has diabetes  Role Documenting the Problem One  Care Management [place] for Problem One  Active  [facility] Long Term Goal   In thenext 31 days, patient will be able to plan 3 low carbohydrate meals as evidenced by her   [place] Term Goal Start Date  07/05/16  Interventions for Problem One Long Term Goal  Inital telephone contact for assessment of case managment needs  [facility] CM Short Term Goal #1   In the next 21 days, patient will meet with this RNCM for diabetes education.  [facility] CM Short Term Goal #1 Start Date  07/05/16  Interventions for Short Term Goal #1  initial telephone contact     Assessment:  Initial telephone contact. Patient and RNCM collaborated for create her initial case management goals.  Plan:  Home visit in the next 21 days for diabetic education, assessment of community care coordination needs

## 2016-07-08 ENCOUNTER — Telehealth: Payer: Self-pay | Admitting: Endocrinology

## 2016-07-08 NOTE — Telephone Encounter (Signed)
Patient was advised by pharmacy that she needs a prior authorization for her Levemir sent to [place], [place]  Please contact patient once this is done.

## 2016-07-09 ENCOUNTER — Other Ambulatory Visit: Payer: Self-pay | Admitting: Pharmacist

## 2016-07-09 ENCOUNTER — Other Ambulatory Visit: Payer: Self-pay

## 2016-07-09 MED ORDER — INSULIN DETEMIR 100 UNIT/ML FLEXPEN: PEN_INJECTOR | SUBCUTANEOUS | 1 refills | Status: DC

## 2016-07-09 NOTE — Telephone Encounter (Signed)
I contacted the patient and advised we have sent the information over to [facility] aid of the Levemir. Patient was advised to call back if she had any questions or issues picking the medication up. Patient voiced understanding.

## 2016-07-09 NOTE — Patient Outreach (Signed)
[facility]) Care Management  07/09/2016  Courtney Goodman April 06, 1945 [id number]   Patient was called regarding medication management per referral from [facility] post discharge.  HIPAA identifiers were obtained.    Since patient was recently discharged and she reported being very confused about her medications, a home visit will be completed versus trying to complete a telephonic medication review.    Plan:  Home visit will be conducted at the patient's home on Friday July 12, 2016 at 1:00pm  [name], PharmD, [place] Clinical Pharmacist [phone]

## 2016-07-10 ENCOUNTER — Other Ambulatory Visit: Payer: Self-pay

## 2016-07-10 NOTE — Patient Outreach (Signed)
[place] [facility]) Care Management  07/10/2016  Torrance Frech Garramone [birth year] [id number]   This RNCM arrived at patient's home for scheduled home visit. Several knocks were made to alert patient to answer door. Knocks were unsuccessful in getting patient to answer door. This RNCM then made telephone call to patient's telephone number [phone]. Patient answered and advised this RNCM she was not at home, she had an emergency and was not at home. Patient stated she was involved in an emergency. Patient advises she is safe and unharmed but would not elaborate.  Plan: This RNCM advised patient she would call at a later time to reschedule this home visit.

## 2016-07-11 ENCOUNTER — Telehealth: Payer: Self-pay

## 2016-07-11 MED ORDER — FOLIC ACID 1 MG PO TABS: 1.0000 mg | ORAL_TABLET | Freq: Every day | ORAL | 1 refills | Status: DC

## 2016-07-11 NOTE — Telephone Encounter (Signed)
done

## 2016-07-11 NOTE — Telephone Encounter (Signed)
That is fine 

## 2016-07-11 NOTE — Telephone Encounter (Signed)
Pt has new mail order pharmacy, [facility]. They are requesting refill of folic acid. Courtney Goodman

## 2016-07-11 NOTE — Telephone Encounter (Signed)
I do not see folic acid on pt's med list. Is this okay to send in

## 2016-07-12 ENCOUNTER — Telehealth: Payer: Self-pay | Admitting: Endocrinology

## 2016-07-12 ENCOUNTER — Encounter: Payer: Self-pay | Admitting: Pharmacist

## 2016-07-12 ENCOUNTER — Other Ambulatory Visit: Payer: Self-pay | Admitting: Pharmacist

## 2016-07-12 NOTE — Patient Outreach (Addendum)
[facility]) Care Management  [place]   07/12/2016  Courtney Goodman 23-Apr-1945 [id number]  Subjective: Home visit completed today at patient's home.  HIPAA identifiers were obtained.  Patient was referred for medication management by [facility] Telephonic Nurse Courtney Goodman. Patient is also being followed by [name], Courtney Goodman.  Patient was recently hospitalized.  Patient is a 71 year old female with multiple medical conditions including but not limited to:   COPD, type 2 diabetes, hypertension, GAD, major depression, hyperlipidemia, OSA and vitamin deficiency.   Patient manages her medications on her own.  She has recently been switched to [facility] order pharmacy and receives some of her medications in a pill pack system.  Since this system is just starting, she also has several medications that are still in bottles.   Objective:   Encounter Medications: Outpatient Encounter Prescriptions as of 07/12/2016  Medication Sig  . ACCU-CHEK AVIVA PLUS test strip USE AS DIRECTED TO CHECK BLOOD SUGAR THREE TIMES A DAY  . ACCU-CHEK SOFTCLIX LANCETS lancets Test 3-4 times a day  . amLODipine-benazepril (LOTREL) 5-20 MG capsule TAKE 1 CAPSULE BY MOUTH EVERY DAY  . aspirin EC 81 MG tablet Take 81 mg by mouth every morning.   . Biotin 10000 MCG TABS Take 1 tablet by mouth every morning.  . Blood Glucose Monitoring Suppl (ACCU-CHEK AVIVA PLUS) w/Device KIT Test twice a day. Dx code E11.9  . carvedilol (COREG) 6.25 MG tablet Take 1 tablet (6.25 mg total) by mouth 2 (two) times daily.  . cholecalciferol (VITAMIN D) 1000 units tablet Take 1 tablet (1,000 Units total) by mouth daily.  . citalopram (CELEXA) 20 MG tablet Take 20 mg by mouth daily.  . fenofibrate (TRICOR) 145 MG tablet Take 1 tablet (145 mg total) by mouth daily.  . ferrous sulfate 325 (65 FE) MG tablet TAKE 1 TABLET BY MOUTH EVERY DAY FOR IRON DEFICIENCY  . fluticasone (FLONASE) 50  MCG/ACT nasal spray Place 2 sprays into both nostrils daily.  . folic acid (FOLVITE) 1 MG tablet Take 1 tablet (1 mg total) by mouth daily.  . Insulin Detemir (LEVEMIR FLEXTOUCH) 100 UNIT/ML Goodman INJECT 150 UNITS SUBCUTANEOUSLY EVERY MORNING (Patient taking differently: Inject 170 Units into the skin. INJECT 150 UNITS SUBCUTANEOUSLY EVERY MORNING)  . omeprazole (PRILOSEC) 40 MG capsule Take 40 mg by mouth daily.  . simvastatin (ZOCOR) 20 MG tablet Take 1 tablet (20 mg total) by mouth at bedtime.  . traMADol (ULTRAM) 50 MG tablet TAKE 1 TABLET BY MOUTH TWICE DAILY AS NEEDED   No facility-administered encounter medications on file as of 07/12/2016.     Functional Status: In your present state of health, do you have any difficulty performing the following activities: 07/05/2016 06/21/2016  Hearing? N N  Vision? Y N  Difficulty concentrating or making decisions? N N  Walking or climbing stairs? Y N  Dressing or bathing? N N  Doing errands, shopping? Y -  Conservation officer, nature and eating ? N -  Using the Toilet? N -  In the past six months, have you accidently leaked urine? N -  Do you have problems with loss of bowel control? N -  Managing your Medications? N -  Managing your Finances? N -  Housekeeping or managing your Housekeeping? N -  Some recent data might be hidden    Fall/Depression Screening: Fall Risk  07/05/2016 07/01/2016 04/18/2016  Falls in the past year? Yes Yes No  Number falls  in past yr: 2 or more 2 or more -  Injury with Fall? No No -  Risk Factor Category  High Fall Risk High Fall Risk -  Risk for fall due to : History of fall(s);Impaired balance/gait;Impaired mobility;Impaired vision;Medication side effect History of fall(s);Impaired balance/gait;Impaired mobility;Medication side effect;Mental status change -  Follow up Falls prevention discussed;Follow up appointment Falls evaluation completed;Education provided -   [facility] 2/9 Scores 07/02/2016 07/01/2016 04/18/2016 03/12/2016 11/27/2015   PHQ - 2 Score 2 2 0 6 4  PHQ- 9 Score 4 4 - 18 10      Assessment:  Patient's medications were reconciled in her home.   Drugs sorted by system:  Neurologic/Psychologic: Citalopram  Cardiovascular: Amlodipine/Benazepril Aspirin Carvedilol Simvastatin   Pulmonary/Allergy:  Gastrointestinal: Omeprazole  Endocrine: Vitamin D Levemir Victoza ?????  Pain: Tramadol  Vitamins/Minerals: Ferrous Sulfate Folic Acid  Biotin  Medication Review Findings:  Adherence with Diabetes therapy: Levemir 170 units daily Victoza-patient had victoza in her possession but was not using it.  It also was not clear from Courtney Goodman note if the patient should still be taking Victoza.  Courtney Goodman office was called for clarification.  Spoke with Courtney Goodman nurse, Courtney Goodman who said she would investigate and call me back.  Pill Pack System:  Simvastatin 66m, Ferrous Sulfate 3241m Fenofibrate 14593mand Amlodipine-Benazepril 5/20 were in the pill pack system.  [facility] was [facility].  The pharmacy technician confirmed they have the following prescriptions on hold that were too early to be filled:   Carvedilol Folic Acid Levemir  Prescriptions will be needed for: Citalopram Omeprazole Fluticasone  Patient mentioned that her blood glucose monitor was not working.  Her monitor was inspected and fixed.  She had two Accu-Check meters.  I was able to repair (set the time, etc) one of the meters.  The other meter read "E-9".  Patient checked her blood sugar twice during our visit and it was 436 and 439 mg/dl  HgA1c 06/14/16-8.7%   Plan:  1.  Route note to Dr. EllLoanne Drilling2.  Route note to PCP  3.  Coordinate with [facility].  Follow up with the patient next week.

## 2016-07-12 NOTE — Telephone Encounter (Signed)
I spoke with Conway with [facility]. She wanted to know if the patient should still be on victoza. After doing some research the medication was last called in on 06/03/2016 and then on 07/05/2016 the medication was not on the current med list. I reviewed recent notes from Dr. Loanne Drilling and could not locate any directions stating the patient needed to discontinue her victoza. Could review for Dr. Loanne Drilling while he is out of town? Thanks!

## 2016-07-12 NOTE — Telephone Encounter (Signed)
If the patient has the medication she should continue taking this.  Please confirm that patient was told to take it

## 2016-07-12 NOTE — Telephone Encounter (Signed)
[place] needs to verify if patient should be taking victoza. It is showing up on her med list and the patient has it. Just not sure if she is supposed to be taking it.

## 2016-07-14 NOTE — Progress Notes (Signed)
Please refill the medications she needs refilled. Katina from [facility] states she needs omeprazole and citalopram refills sent to [facility]. Thanks.

## 2016-07-15 ENCOUNTER — Other Ambulatory Visit: Payer: Self-pay

## 2016-07-15 MED ORDER — LIRAGLUTIDE 18 MG/3ML ~~LOC~~ SOPN: PEN_INJECTOR | SUBCUTANEOUS | 2 refills | Status: DC

## 2016-07-15 NOTE — Telephone Encounter (Signed)
Dr. Loanne Drilling see messages to be advised. I contacted the patient and she stated she could not remember being instructed to discontinuing the victoza. Victoza resubmitted to the pharmacy.

## 2016-07-15 NOTE — Patient Outreach (Signed)
    Telephone contact with patient to reschedule appointment missed last week. Patient advises she had an emergency and could not be available for that appointment.  RNCM and patient agreed to meet for the rescheduled initial home visit within the next 21 days.  Plan: Home vsisit in the next 21 days.

## 2016-07-18 ENCOUNTER — Ambulatory Visit (INDEPENDENT_AMBULATORY_CARE_PROVIDER_SITE_OTHER): Payer: Medicare HMO | Admitting: Family Medicine

## 2016-07-18 ENCOUNTER — Encounter: Payer: Self-pay | Admitting: Family Medicine

## 2016-07-18 ENCOUNTER — Telehealth: Payer: Self-pay

## 2016-07-18 VITALS — BP 112/70 | HR 67 | Wt 181.2 lb

## 2016-07-18 DIAGNOSIS — F411 Generalized anxiety disorder: Secondary | ICD-10-CM | POA: Diagnosis not present

## 2016-07-18 DIAGNOSIS — F333 Major depressive disorder, recurrent, severe with psychotic symptoms: Secondary | ICD-10-CM

## 2016-07-18 MED ORDER — FENOFIBRATE 145 MG PO TABS: 145.0000 mg | ORAL_TABLET | Freq: Every day | ORAL | 0 refills | Status: DC

## 2016-07-18 MED ORDER — AMLODIPINE BESY-BENAZEPRIL HCL 5-20 MG PO CAPS: ORAL_CAPSULE | ORAL | 0 refills | Status: DC

## 2016-07-18 NOTE — Telephone Encounter (Signed)
Patient needs amlodipine- benzapril and fenofibrate sent to [facility] instead of [facility]. Thanks, RLB

## 2016-07-18 NOTE — Telephone Encounter (Signed)
This was already done at visit today

## 2016-07-18 NOTE — Progress Notes (Signed)
   Subjective:    Patient ID: Courtney Goodman, female    DOB: 1945/03/06, 71 y.o.   MRN: [id number]  HPI Chief Complaint  Patient presents with  . 2 week follow-up    2 week follow-up.    She is here for a 2 week follow up on depression. States her mood is better. No episodes of severe anxiety. Denies SI or HI. States she does not feel sad or depressed. She started taking citalopram 2 weeks ago and states she is doing well on this. States she is sleeping better. No side effects. States she has an appointment next week with her new psychiatrist.   She is now doing pill pack. States she feels better about her medications.  [facility] has been helping her with transportation, advance directives, and medications.   She is seeing Dr. Loanne Drilling for diabetes management.  States her fasting BS was 145 this morning. Denies any low BS readings.   Denies fever, chills, dizziness, chest pain, palpitations, shortness of breath, abdominal pain, N/V/D. No increased thirst or urination.    Reviewed allergies, medications, past medical, and social history.   Review of Systems Pertinent positives and negatives in the history of present illness.     Objective:   Physical Exam BP 112/70   Pulse 67   Wt 181 lb 3.2 oz (82.2 kg)   BMI 31.10 kg/m   Alert and in no distress. Cardiac exam shows a regular rhythm without murmurs or gallops. Lungs are clear to auscultation.      Assessment & Plan:  Severe episode of recurrent major depressive disorder, with psychotic features (HCC)  GAD (generalized anxiety disorder)  I appreciate the assistance that [facility] is providing this patient and she acknowledges their help as well.  She appears to be doing much better on current medications. Does not appear to be in any danger of harming herself. Her mood and outlook on life is much better. Will have her continue on current medications and follow up with Dr. Loanne Drilling for diabetes and go to her psychiatrist appointment as  scheduled.  Encouraged her to contact her nephrologist to determine when she is due to follow up with them for her CKD.  Will get her back in August for a medcheck and fasting labs.  She will follow up

## 2016-07-18 NOTE — Patient Instructions (Addendum)
Continue on current medications.  Follow up with Dr. Loanne Drilling as scheduled.   CALL [facility] AND SEE WHEN YOU ARE SUPPOSED TO FOLLOW UP WITH THEM PHONE # [phone]  We need to get you back in late August for a medicare well visit and fasting blood work.

## 2016-07-19 ENCOUNTER — Other Ambulatory Visit: Payer: Self-pay | Admitting: Pharmacist

## 2016-07-19 NOTE — Patient Outreach (Addendum)
[place] [place] [facility]) Care Management  07/19/2016  Courtney Goodman 1945-02-24 [id number]   Patient was called to follow up after our home visit last week. HIPAA identifiers were obtained.  Patient confirmed someone from Dr. Cordelia Pen office got back with her after the home visit about Victoza.  She is still supposed to be using Victoza and reported using it this week.  Patient said she is using her pill packs as directed but still has some medications in bottles and there are a couple of notes from her PCP about refills being called into [facility].    A home visit was scheduled with the patient 07/25/16 to investigate the pill pack and her medications.  A phone call was placed to her PCP office and a message was left with CMA, Arley Phenix to discuss the patient's meds and having all of her medications dispensed at [facility].  Plan:  Conduct home visit next week.  Coordinate with PCP    [name], PharmD, [place] Clinical Pharmacist [phone]

## 2016-07-22 ENCOUNTER — Other Ambulatory Visit: Payer: Self-pay | Admitting: Licensed Clinical Social Worker

## 2016-07-22 NOTE — Patient Outreach (Signed)
[place] [facility]) Care Management  07/22/2016  Courtney Goodman 07/28/1945 [id number]  Assessment- CSW completed outreach call to patient and she answered. HIPPA verifications were provided. Patient reports that she is doing "very well and just woke up from a nap" and has not been experienced any suicidal ideations recently. She reports that she is able to manage her depressive symptoms by appropriate coping tools. CSW provided brief review of depression management skills. CSW questioned if patient had the chance to get advance directives notarized yet and she declined. She reports that she will so within the next 30 days. CSW provided instructions on completing advance directives. Patient reports that she will be going by to sleep at this time and CSW can contact her on a later date.  Plan-CSW will follow up within two weeks and continue to provide social work support.  Courtney Goodman, BSW, MSW, [email or web address] Phone: [phone] Fax: [phone]

## 2016-07-23 ENCOUNTER — Encounter: Payer: Self-pay | Admitting: Podiatry

## 2016-07-23 ENCOUNTER — Ambulatory Visit (INDEPENDENT_AMBULATORY_CARE_PROVIDER_SITE_OTHER): Payer: Medicare HMO | Admitting: Podiatry

## 2016-07-23 DIAGNOSIS — M79609 Pain in unspecified limb: Secondary | ICD-10-CM | POA: Diagnosis not present

## 2016-07-23 DIAGNOSIS — B351 Tinea unguium: Secondary | ICD-10-CM | POA: Diagnosis not present

## 2016-07-23 DIAGNOSIS — E1142 Type 2 diabetes mellitus with diabetic polyneuropathy: Secondary | ICD-10-CM

## 2016-07-23 NOTE — Patient Instructions (Signed)

## 2016-07-23 NOTE — Progress Notes (Signed)
Patient ID: Courtney Goodman, female   DOB: March 10, 1945, 71 y.o.   MRN: [id number]    Subjective: This diabetic patient presents today for a scheduled visit complaining of uncomfortable toenails walking wearing shoes and request toenail debridement  Objective: Orientated 3 DP pulses 2/4 bilaterally PT pulses 1/4 bilaterally Capillary reflex immediate bilaterally Sensation to 10 g monofilament wire intact 5/5 bilaterally Vibratory sensation nonreactive bilaterally Ankle reflexes reactive bilaterally Well-healed surgical scar dorsal medial first MPJ right and medial lateral right ankle  No  Open skin lesions bilaterally Atrophic skin with absent hair growth bilaterally The toenails are elongated, hypertrophic, discolored, deformed and tender direct palpation 6-10 Manual motor testing dorsi flexion, plantar flexion 5/5 bilaterally  Assessment: Diabetic with loss of vibratory sensation//peripheral neuropathy Symptomatic onychomycoses 6-10  Plan: Debridement of toenails 6-10 mechanical he and allegedly without any bleeding  Reappoint 3 months

## 2016-07-24 ENCOUNTER — Other Ambulatory Visit: Payer: Self-pay

## 2016-07-24 ENCOUNTER — Telehealth: Payer: Self-pay | Admitting: Family Medicine

## 2016-07-24 NOTE — Telephone Encounter (Signed)
[facility] Pam faulkner called and state that pt needs a new glucose meter pt would like it sent to the [facility], 

## 2016-07-25 ENCOUNTER — Telehealth: Payer: Self-pay

## 2016-07-25 ENCOUNTER — Other Ambulatory Visit: Payer: Self-pay | Admitting: Pharmacist

## 2016-07-25 MED ORDER — GLUCOSE BLOOD VI STRP: ORAL_STRIP | 0 refills | Status: DC

## 2016-07-25 MED ORDER — OMEPRAZOLE 40 MG PO CPDR: 40.0000 mg | DELAYED_RELEASE_CAPSULE | Freq: Every day | ORAL | 0 refills | Status: DC

## 2016-07-25 MED ORDER — ACCU-CHEK SOFTCLIX LANCETS MISC: 0 refills | Status: DC

## 2016-07-25 MED ORDER — ACCU-CHEK AVIVA PLUS W/DEVICE KIT: PACK | 0 refills | Status: DC

## 2016-07-25 MED ORDER — CITALOPRAM HYDROBROMIDE 20 MG PO TABS: 20.0000 mg | ORAL_TABLET | Freq: Every day | ORAL | 0 refills | Status: DC

## 2016-07-25 MED ORDER — FLUTICASONE PROPIONATE 50 MCG/ACT NA SUSP: 2.0000 | Freq: Every day | NASAL | 0 refills | Status: DC

## 2016-07-25 NOTE — Telephone Encounter (Signed)
Alwyn Ren- pharmacist with [facility] called to ask if we can send any medications that we manage to [place]. Pt is getting confused on what medications she is to be taking and when to take them. She definitely needs citalopram, omeprazole, and fluticasone sent to [facility]. Is it ok to transfer all scripts to [facility]. Please send back to me as I need to relay information back to [place].  Victorino December

## 2016-07-25 NOTE — Telephone Encounter (Signed)
Please take care of this.  

## 2016-07-25 NOTE — Telephone Encounter (Signed)
Pt sees ENDOCRINOLOGY for her DM (look in chart).  I will refill this time only, but additonal refills will need to go to endocrinology

## 2016-07-25 NOTE — Patient Outreach (Signed)
[place] [facility]) Care Management  [place]   07/25/2016  Courtney Goodman 1945/04/13 [id number]  Subjective: Home visit was completed in patient's home today.  HIPAA identifiers were obtained.  Purpose of today's visit was to help patient organize her medications. She is in the process of switching over to get all of her medications delivered from [place].    Objective:   Encounter Medications: Outpatient Encounter Prescriptions as of 07/25/2016  Medication Sig  . ACCU-CHEK SOFTCLIX LANCETS lancets Test 3-4 times a day  . amLODipine-benazepril (LOTREL) 5-20 MG capsule TAKE 1 CAPSULE BY MOUTH EVERY DAY  . aspirin EC 81 MG tablet Take 81 mg by mouth every morning.   . Blood Glucose Monitoring Suppl (ACCU-CHEK AVIVA PLUS) w/Device KIT Test twice a day. Dx code E11.9  . carvedilol (COREG) 6.25 MG tablet Take 1 tablet (6.25 mg total) by mouth 2 (two) times daily.  . citalopram (CELEXA) 20 MG tablet Take 20 mg by mouth daily.  . fenofibrate (TRICOR) 145 MG tablet Take 1 tablet (145 mg total) by mouth daily.  . ferrous sulfate 325 (65 FE) MG tablet TAKE 1 TABLET BY MOUTH EVERY DAY FOR IRON DEFICIENCY  . fluticasone (FLONASE) 50 MCG/ACT nasal spray Place 2 sprays into both nostrils daily.  Marland Kitchen glucose blood (ACCU-CHEK AVIVA PLUS) test strip USE AS DIRECTED TO CHECK BLOOD SUGAR THREE TIMES A DAY  . Insulin Detemir (LEVEMIR FLEXTOUCH) 100 UNIT/ML Pen INJECT 150 UNITS SUBCUTANEOUSLY EVERY MORNING (Patient taking differently: Inject 170 Units into the skin. INJECT 150 UNITS SUBCUTANEOUSLY EVERY MORNING)  . liraglutide (VICTOZA) 18 MG/3ML SOPN Inject 1.8 mg daily.  Marland Kitchen omeprazole (PRILOSEC) 40 MG capsule Take 40 mg by mouth daily.  . simvastatin (ZOCOR) 20 MG tablet Take 1 tablet (20 mg total) by mouth at bedtime.  . traMADol (ULTRAM) 50 MG tablet TAKE 1 TABLET BY MOUTH TWICE DAILY AS NEEDED  . Biotin 10000 MCG TABS Take 1 tablet by mouth every morning.  . cholecalciferol  (VITAMIN D) 1000 units tablet Take 1 tablet (1,000 Units total) by mouth daily.  . folic acid (FOLVITE) 1 MG tablet Take 1 tablet (1 mg total) by mouth daily.   No facility-administered encounter medications on file as of 07/25/2016.     Functional Status: In your present state of health, do you have any difficulty performing the following activities: 07/05/2016 06/21/2016  Hearing? N N  Vision? Y N  Difficulty concentrating or making decisions? N N  Walking or climbing stairs? Y N  Dressing or bathing? N N  Doing errands, shopping? Y -  Conservation officer, nature and eating ? N -  Using the Toilet? N -  In the past six months, have you accidently leaked urine? N -  Do you have problems with loss of bowel control? N -  Managing your Medications? N -  Managing your Finances? N -  Housekeeping or managing your Housekeeping? N -  Some recent data might be hidden    Fall/Depression Screening: Fall Risk  07/05/2016 07/01/2016 04/18/2016  Falls in the past year? Yes Yes No  Number falls in past yr: 2 or more 2 or more -  Injury with Fall? No No -  Risk Factor Category  High Fall Risk High Fall Risk -  Risk for fall due to : History of fall(s);Impaired balance/gait;Impaired mobility;Impaired vision;Medication side effect History of fall(s);Impaired balance/gait;Impaired mobility;Medication side effect;Mental status change -  Follow up Falls prevention discussed;Follow up appointment Falls evaluation completed;Education  provided -   PHQ 2/9 Scores 07/02/2016 07/01/2016 04/18/2016 03/12/2016 11/27/2015  PHQ - 2 Score 2 2 0 6 4  PHQ- 9 Score 4 4 - 18 10      Assessment: Patient's medications were reviewed during the home visit.  Unfortunately, patient has medications in pill bottles from [facility] and [facility].  She also has medications in a Pill Pack Box from [facility].   She ran out of amlodipine/benazepril and fenofibrate in the bottles so she has been taking them out of the pack. She had enough  Simvastatin in the bottle so she was still taking simvastatin out of the bottle.  Since the patient ran out of amlodipine/benazepril and fenofibrate, she may not have enough to last her until the next pill pack.  (We will keep an eye on this)  Levemir was filled at [facility] and the patient was concerned about the quantity because she only had 1 box.  [facility] was called and they did not have enough to fill the entire prescription (3 boxes) so they gave the patient 1 box.  Patient did not understand this originally but now understands and will go and pick it up this week.  Patient was educated on how to take her medications using both the pil pack and bottles (just for now).  Optimally, the patient should be getting her medications at one pharmacy.  Patient's PCP will be called and asked to send the patient's active medication profile to [facility].   [facility] has the following medications on their profile and are scheduled to be delivered by 08/13/16 Amlodipine/benazepril Carvedilol Fenofibrate Ferrous Sulfate Folic Acid Simvastatin Accu-Check Levemir Flex  Medications that need to be sent to [facility] Citalopram--[facility] will request a refill from Dr. Edythe Clarity [facility] will request a refill from Dr. Rayetta Pigg enough but will need a prescription sent in as well  Called Vicki Henson's office and spoke to [employer].  Their office will send the patient's entire profile to [place].    Plan:  1. Come back out to patient's home in 2 weeks to inspect pill packs and make sure she has enough of everything.
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
## 2016-07-25 NOTE — Telephone Encounter (Signed)
[name],  I sent these med to [facility] for patient. But for Celexa she should get this refilled from psychiatrist from now on. She has an appt sometime in July but I gave her a 90 day supply for all 3 meds.

## 2016-07-26 ENCOUNTER — Other Ambulatory Visit: Payer: Self-pay | Admitting: Family Medicine

## 2016-07-29 NOTE — Telephone Encounter (Signed)
Pt takes tramadol like twice a week for pain in her ankles, and knees and carpal tunnel in her wrist. Pt has pins in her ankle from a break from over 20 years when she had surgery. She does not see an orthopedist about this. Pt was advised to follow-up in 3 months for continued use of this med. She states [place]s going out of [place] and will have to call when she gets back. Pt changed her psych appt from July 6 to August 10th due to going out of town. I have called this med in to pharmacy

## 2016-07-29 NOTE — Telephone Encounter (Signed)
Please call her and find out if she has an orthopedist. Documentation shows that she takes Tramadol some days for chronic knee pain. How often is she taking it? Ok to refill once we get this information but if she is needing Tramadol daily then we should see her for her knee pain or have her follow up with her orthopedist. Thanks.

## 2016-07-29 NOTE — Telephone Encounter (Signed)
Is this okay to call you

## 2016-07-30 ENCOUNTER — Ambulatory Visit: Payer: Medicare HMO | Admitting: Endocrinology

## 2016-07-30 ENCOUNTER — Other Ambulatory Visit: Payer: Self-pay | Admitting: Pharmacist

## 2016-07-31 NOTE — Patient Outreach (Signed)
[facility]) Care Management  07/30/2016  Courtney Goodman Sep 18, 1945 [id number]  Patient called after 5pm on 07/29/16 to inquire about the copay of her medication. HIPAA identifiers were obtained.  When I called her back, she stated the issue had been resolved.     Plan:  Pharmacy home visit is already scheduled 08/09/16 to inspect the patient's pill packs.   [name], PharmD, [place] Clinical Pharmacist [phone]

## 2016-08-01 ENCOUNTER — Other Ambulatory Visit: Payer: Self-pay | Admitting: Family Medicine

## 2016-08-01 NOTE — Telephone Encounter (Signed)
Pt has an appt with endo on 7/18. I will go ahead and refill it this time only and then she will need to get it from Endocrinology from now on

## 2016-08-06 ENCOUNTER — Other Ambulatory Visit: Payer: Self-pay | Admitting: Licensed Clinical Social Worker

## 2016-08-06 NOTE — Patient Outreach (Signed)
[place] [facility]) Care Management  08/06/2016  Courtney Goodman 1945/05/23 [id number]  Assessment-CSW completed outreach attempt today. CSW unable to reach patient successfully. CSW left a HIPPA compliant voice message encouraging patient to return call once available.  Plan-CSW will await return call or complete an additional outreach if needed.  [name], BSW, MSW, [email or web address] Phone: [phone] Fax: [phone]

## 2016-08-07 ENCOUNTER — Other Ambulatory Visit: Payer: Self-pay
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
## 2016-08-09 ENCOUNTER — Ambulatory Visit: Payer: Self-pay | Admitting: Pharmacist

## 2016-08-09 ENCOUNTER — Other Ambulatory Visit: Payer: Self-pay

## 2016-08-12 ENCOUNTER — Telehealth: Payer: Self-pay

## 2016-08-12 NOTE — Telephone Encounter (Signed)
[place] and they state they received a fax on 7/11 authorizing test strips, lancets and meter. There is no documentation of this. I have asked them to not fill anything and to send [place] a copy. Pt sees endocrinology in 2 days and needs to get this from them. We fill test strips and meter on 6/28 for 30 days just to get her to her endo appt on 7/18. Waiting for fax to come through to look at it about testing supplies

## 2016-08-12 NOTE — Telephone Encounter (Signed)
She has an endocrinology. We do not refill her diabetes.

## 2016-08-12 NOTE — Telephone Encounter (Signed)
They said the order came from [name], NP on 08/07/2016. Courtney Goodman

## 2016-08-12 NOTE — Telephone Encounter (Signed)
Got the fax from pharmacy and it was authorized by [facility] with vickie'ss info on it. Pt has an appt on 7/18 so pt can get testing supplies from them then. Vickie is aware of this being denied

## 2016-08-12 NOTE — Telephone Encounter (Signed)
[place] called to get further details on glucometer script. Please call back at [phone]. Victorino December

## 2016-08-14 ENCOUNTER — Ambulatory Visit: Payer: Medicare HMO | Admitting: Endocrinology

## 2016-08-14 ENCOUNTER — Other Ambulatory Visit: Payer: Self-pay | Admitting: Pharmacist

## 2016-08-14 NOTE — Patient Outreach (Signed)
[place] [facility]) Care Management  08/14/2016  Courtney Goodman 05-Mar-1945 [id number]   Called patient to remind her about using both the Pill Pack Box and the medication bottles filled at [facility].  HIPAA identifiers were obtained.    During the 08/09/16 homerist, it was determined the patient inadvertently ordered medications from [place] in addition to the medications previously ordered from [place].  Spoke with [name] on 08/09/16 at the patient's PCP office about taking [place] out of the patient's clinical demographics on 08/09/16 to decrease confusion.  Patient confirmed today she understands to use the medication bottles from [place] along with the [place] from [place].  Duplications were sorted and set aside during the 08/09/16 home visit.  Plan:  Follow up with the patient in 14 business days to assist with reordering supplies.   [name], PharmD, [place] Clinical Pharmacist [phone]

## 2016-08-15 ENCOUNTER — Encounter: Payer: Self-pay | Admitting: Endocrinology

## 2016-08-15 ENCOUNTER — Ambulatory Visit (INDEPENDENT_AMBULATORY_CARE_PROVIDER_SITE_OTHER): Payer: Medicare HMO | Admitting: Endocrinology

## 2016-08-15 VITALS — BP 122/76 | HR 74 | Wt 182.8 lb

## 2016-08-15 DIAGNOSIS — Z794 Long term (current) use of insulin: Secondary | ICD-10-CM | POA: Diagnosis not present

## 2016-08-15 DIAGNOSIS — E1122 Type 2 diabetes mellitus with diabetic chronic kidney disease: Secondary | ICD-10-CM | POA: Diagnosis not present

## 2016-08-15 DIAGNOSIS — N183 Chronic kidney disease, stage 3 (moderate): Secondary | ICD-10-CM | POA: Diagnosis not present

## 2016-08-15 LAB — POCT GLYCOSYLATED HEMOGLOBIN (HGB A1C): Hemoglobin A1C: 8.6

## 2016-08-15 MED ORDER — INSULIN DETEMIR 100 UNIT/ML FLEXPEN: 190.0000 [IU] | PEN_INJECTOR | SUBCUTANEOUS | 11 refills | Status: DC

## 2016-08-15 NOTE — Patient Instructions (Addendum)
check your blood sugar twice a day.  vary the time of day when you check, between before the 3 meals, and at bedtime.  also check if you have symptoms of your blood sugar being too high or too low.  please keep a record of the readings and bring it to your next appointment here (or you can bring the meter itself).  You can write it on any piece of paper.  please call us sooner if your blood sugar goes below 70, or if you have a lot of readings over 200.   please increase the levemir to 190 units each morning.   On this type of insulin schedule, you should eat meals on a regular schedule.  If a meal is missed or significantly delayed, your blood sugar could go low.  Please come back for a follow-up appointment in 2 months.

## 2016-08-15 NOTE — Progress Notes (Signed)
Subjective:    Patient ID: Courtney Goodman, female    DOB: December 08, 1945, 71 y.o.   MRN: [id number]  HPI Pt returns for f/u of diabetes mellitus: DM type: Insulin-requiring type 2 Dx'ed: 6578 Complications: renal insufficiency and CAD Therapy: insulin since 2009, and victoza. GDM: never DKA: never Severe hypoglycemia: once (2012). Pancreatitis: never.  Other: she is on qd insulin, due to h/o noncompliance.   Interval history: no cbg record, but states cbg's vary from 100-200's.  It is in general higher as the day goes on.  pt states she feels well in general.   Past Medical History:  Diagnosis Date  . Anemia of chronic disease   . Asthma   . Atherosclerotic heart disease of native coronary artery without angina pectoris   . COPD (chronic obstructive pulmonary disease) ([place])   . DM (diabetes mellitus), type 2 with renal complications ([place])   . Dysrhythmia   . GAD (generalized anxiety disorder)   . Hypertension   . Mixed hyperlipidemia due to type 2 diabetes mellitus ([place])   . Mixed incontinence    per medical records from [place]  . Osteopenia   . Personal history of noncompliance with medical treatment, presenting hazards to health   . Shortness of breath dyspnea   . Suicidal ideation   . TB (tuberculosis), treated    age 40  . Vitamin D deficiency     Past Surgical History:  Procedure Laterality Date  . ANKLE RECONSTRUCTION     right  . CARPAL TUNNEL RELEASE    . CORONARY ANGIOPLASTY      Social History   Social History  . Marital status: Divorced    Spouse name: N/A  . Number of children: N/A  . Years of education: N/A   Occupational History  . Not on file.   Social History Main Topics  . Smoking status: Former Research scientist (life sciences)  . Smokeless tobacco: Never Used  . Alcohol use No  . Drug use: No  . Sexual activity: No   Other Topics Concern  . Not on file   Social History Narrative  . No narrative on file    Current Outpatient Prescriptions on File Prior to  Visit  Medication Sig Dispense Refill  . ACCU-CHEK SOFTCLIX LANCETS lancets USE AS DIRECTED TO CHECK BLOOD SUGAR THREE TIMES A DAY 300 each 0  . amLODipine-benazepril (LOTREL) 5-20 MG capsule TAKE 1 CAPSULE BY MOUTH EVERY DAY 90 capsule 0  . aspirin EC 81 MG tablet Take 81 mg by mouth every morning.     . Biotin 10000 MCG TABS Take 1 tablet by mouth every morning.    . Blood Glucose Monitoring Suppl (ACCU-CHEK AVIVA PLUS) w/Device KIT Test twice a day. Dx code E11.9 1 kit 0  . carvedilol (COREG) 6.25 MG tablet Take 1 tablet (6.25 mg total) by mouth 2 (two) times daily. 180 tablet 0  . cholecalciferol (VITAMIN D) 1000 units tablet Take 1 tablet (1,000 Units total) by mouth daily. 30 tablet 2  . citalopram (CELEXA) 20 MG tablet Take 1 tablet (20 mg total) by mouth daily. 90 tablet 0  . fenofibrate (TRICOR) 145 MG tablet Take 1 tablet (145 mg total) by mouth daily. 90 tablet 0  . ferrous sulfate 325 (65 FE) MG tablet TAKE 1 TABLET BY MOUTH EVERY DAY FOR IRON DEFICIENCY 90 tablet 0  . fluticasone (FLONASE) 50 MCG/ACT nasal spray Place 2 sprays into both nostrils daily. 48 g 0  . folic acid (FOLVITE) 1  MG tablet Take 1 tablet (1 mg total) by mouth daily. 90 tablet 1  . glucose blood (ACCU-CHEK AVIVA PLUS) test strip USE AS DIRECTED TO CHECK BLOOD SUGAR THREE TIMES A DAY 100 each 0  . liraglutide (VICTOZA) 18 MG/3ML SOPN Inject 1.8 mg daily. 6 mL 2  . omeprazole (PRILOSEC) 40 MG capsule Take 1 capsule (40 mg total) by mouth daily. 90 capsule 0  . simvastatin (ZOCOR) 20 MG tablet Take 1 tablet (20 mg total) by mouth at bedtime. 90 tablet 0  . traMADol (ULTRAM) 50 MG tablet Take 1 tablet (50 mg total) by mouth daily as needed. 90 tablet 0   No current facility-administered medications on file prior to visit.     No Known Allergies  Family History  Problem Relation Age of Onset  . Diabetes Mother     BP 122/76   Pulse 74   Wt 182 lb 12.8 oz (82.9 kg)   SpO2 97%   BMI 31.38 kg/m    Review  of Systems She denies hypoglycemia.     Objective:   Physical Exam VITAL SIGNS:  See vs page.  GENERAL: no distress.   Pulses: foot pulses are intact bilaterally.   MSK: no deformity of the feet or ankles.  CV: no edema of the legs or ankles Skin:  no ulcer on the feet or ankles.  normal color and temp on the feet and ankles.  Old healed surgical scars on the right ankle and foot Neuro: sensation is intact to touch on the feet and ankles.   Ext: There is bilateral onychomycosis of the toenails.   A1c=8.6%    Assessment & Plan:  Insulin-requiring type 2 DM, with: she needs increased rx.    Patient Instructions  check your blood sugar twice a day.  vary the time of day when you check, between before the 3 meals, and at bedtime.  also check if you have symptoms of your blood sugar being too high or too low.  please keep a record of the readings and bring it to your next appointment here (or you can bring the meter itself).  You can write it on any piece of paper.  please call us sooner if your blood sugar goes below 70, or if you have a lot of readings over 200.   please increase the levemir to 190 units each morning.   On this type of insulin schedule, you should eat meals on a regular schedule.  If a meal is missed or significantly delayed, your blood sugar could go low.  Please come back for a follow-up appointment in 2 months.

## 2016-08-19 ENCOUNTER — Other Ambulatory Visit: Payer: Self-pay | Admitting: Family Medicine

## 2016-08-21 ENCOUNTER — Telehealth: Payer: Self-pay | Admitting: Endocrinology

## 2016-08-21 ENCOUNTER — Other Ambulatory Visit: Payer: Self-pay

## 2016-08-21 ENCOUNTER — Other Ambulatory Visit: Payer: Self-pay | Admitting: Pharmacist

## 2016-08-21 NOTE — Telephone Encounter (Signed)
Call Denyse Amass from [facility] to advise on the Insulin Detemir (LEVEMIR FLEXTOUCH) 100 UNIT/ML Pen dosage. Call [phone].

## 2016-08-21 NOTE — Patient Outreach (Signed)
[place] [facility]) Care Management  08/21/2016  Courtney Goodman March 20, 1945 [id number]   Received a call from [name], Erenest Rasher about patient's insulin while in a home visit. HIPAA identifiers were confirmed. Olin Hauser reported the patient had multiple empty insulin pens in her home and she was afraid the patient had taken too much insulin and that the patient was very confused about her insulin dose.  Patient is supposed to be injecting Levemir 190 units daily. Olin Hauser reported the patient could not communicate to her today how many units she has been injecting.  It is not possible to inject 190 units in one injection.  The pen device will only inject 80 units at a time.  To get to 190 units, the patient would have to do 3 separate injections.    Dr. Cordelia Pen office was called for dose clarification and to discuss concerns about the patient's lack of understanding about her dose.    Plan:  Await a phone call back from Dr. Cordelia Pen office Consult with [place] Nurse, Erenest Rasher Follow up with patient in 1-2 business days.  [name], PharmD, [place] Clinical Pharmacist [phone]

## 2016-08-21 NOTE — Telephone Encounter (Signed)
Patient can't come in tomorrow 07/26 b/c she needs time to set up transportation. Please call to resched.  -LL

## 2016-08-21 NOTE — Patient Outreach (Signed)
[place] [facility]) Care Management  08/21/2016  Courtney Goodman 02-Aug-1945 [id number]   Sarah from Dr. Cordelia Pen office called me back to discuss [name]'s Levemir dose. The dose was confirmed as 190 units daily.  Dr. Loanne Drilling wanted to see the patient and could see her as early as tomorrow at 3:45pm.  Unfortunately, the patient said she could not get transportation to get to her appointment tomorrow.  Patient also said that after speaking with [place] Nurse, Erenest Rasher today, she know understands how to inject her insulin.  Patient said she was getting confused on the math but now understands.  Patient was asked to schedule an appointment with Dr. Loanne Drilling ASAP and I will accompany her to the appointment.   [name], PharmD, [place] Clinical Pharmacist [phone]

## 2016-08-21 NOTE — Telephone Encounter (Signed)
Patient needs a different day than friday to be seen b/c of transportation.  Thank you,  -LL

## 2016-08-21 NOTE — Telephone Encounter (Signed)
Called and spoke with Courtney Goodman made Courtney Goodman an appt. For tomorrow at 3:45 about insulin confusion.

## 2016-08-21 NOTE — Telephone Encounter (Signed)
Called patient and scheduled for Friday at 1pm.

## 2016-08-21 NOTE — Telephone Encounter (Signed)
Called patient and she will call back when doctor Loanne Drilling gets back from vacation August 13th to see if any appointments have been cancelled so can come in.

## 2016-08-22 ENCOUNTER — Ambulatory Visit: Payer: Medicare HMO | Admitting: Pharmacist

## 2016-08-22 ENCOUNTER — Other Ambulatory Visit: Payer: Self-pay | Admitting: Family Medicine

## 2016-08-22 ENCOUNTER — Ambulatory Visit: Payer: Medicare HMO | Admitting: Endocrinology

## 2016-08-22 NOTE — Patient Outreach (Signed)
[facility]) Care Management   08/22/2016  Courtney Goodman [birth year] [id number]  Courtney Goodman is an 71 y.o. female with history of depression, hypertension, diabetes and heart failure  Subjective:  "the doctor increased my Levemir to 170 and I cannot figure it out."  Objective:   ROS Well dressed, elderly, well groomed, anxious lady   Physical Exam   ROS  Encounter Medications:   Outpatient Encounter Prescriptions as of 08/21/2016  Medication Sig  . ACCU-CHEK SOFTCLIX LANCETS lancets USE AS DIRECTED TO CHECK BLOOD SUGAR THREE TIMES A DAY  . amLODipine-benazepril (LOTREL) 5-20 MG capsule TAKE 1 CAPSULE BY MOUTH EVERY DAY  . amLODipine-benazepril (LOTREL) 5-20 MG capsule TAKE 1 CAPSULE BY MOUTH EVERY DAY  . aspirin EC 81 MG tablet Take 81 mg by mouth every morning.   . Biotin 10000 MCG TABS Take 1 tablet by mouth every morning.  . Blood Glucose Monitoring Suppl (ACCU-CHEK AVIVA PLUS) w/Device KIT Test twice a day. Dx code E11.9  . carvedilol (COREG) 6.25 MG tablet Take 1 tablet (6.25 mg total) by mouth 2 (two) times daily.  . cholecalciferol (VITAMIN D) 1000 units tablet Take 1 tablet (1,000 Units total) by mouth daily.  . citalopram (CELEXA) 20 MG tablet Take 1 tablet (20 mg total) by mouth daily.  . fenofibrate (TRICOR) 145 MG tablet Take 1 tablet (145 mg total) by mouth daily.  . ferrous sulfate 325 (65 FE) MG tablet TAKE 1 TABLET BY MOUTH EVERY DAY FOR IRON DEFICIENCY  . fluticasone (FLONASE) 50 MCG/ACT nasal spray Place 2 sprays into both nostrils daily.  . folic acid (FOLVITE) 1 MG tablet Take 1 tablet (1 mg total) by mouth daily.  Marland Kitchen glucose blood (ACCU-CHEK AVIVA PLUS) test strip USE AS DIRECTED TO CHECK BLOOD SUGAR THREE TIMES A DAY  . Insulin Detemir (LEVEMIR FLEXTOUCH) 100 UNIT/ML Pen Inject 190 Units into the skin every morning.  . liraglutide (VICTOZA) 18 MG/3ML SOPN Inject 1.8 mg daily.  Marland Kitchen omeprazole (PRILOSEC) 40 MG capsule Take 1 capsule (40 mg  total) by mouth daily.  . simvastatin (ZOCOR) 20 MG tablet Take 1 tablet (20 mg total) by mouth at bedtime.  . traMADol (ULTRAM) 50 MG tablet Take 1 tablet (50 mg total) by mouth daily as needed.   No facility-administered encounter medications on file as of 08/21/2016.     Functional Status:   In your present state of health, do you have any difficulty performing the following activities: 07/05/2016 06/21/2016  Hearing? N N  Vision? Y N  Difficulty concentrating or making decisions? N N  Walking or climbing stairs? Y N  Dressing or bathing? N N  Doing errands, shopping? Y -  Conservation officer, nature and eating ? N -  Using the Toilet? N -  In the past six months, have you accidently leaked urine? N -  Do you have problems with loss of bowel control? N -  Managing your Medications? N -  Managing your Finances? N -  Housekeeping or managing your Housekeeping? N -  Some recent data might be hidden    Fall/Depression Screening:    Fall Risk  07/05/2016 07/01/2016 04/18/2016  Falls in the past year? Yes Yes No  Number falls in past yr: 2 or more 2 or more -  Injury with Fall? No No -  Risk Factor Category  High Fall Risk High Fall Risk -  Risk for fall due to : History of fall(s);Impaired balance/gait;Impaired mobility;Impaired vision;Medication side effect History  of fall(s);Impaired balance/gait;Impaired mobility;Medication side effect;Mental status change -  Follow up Falls prevention discussed;Follow up appointment Falls evaluation completed;Education provided -   [facility] 2/9 Scores 07/02/2016 07/01/2016 04/18/2016 03/12/2016 11/27/2015  PHQ - 2 Score 2 2 0 6 4  PHQ- 9 Score 4 4 - 18 10   THN CM Care Plan Problem One     Most Recent Value  Care Plan Problem One  (P) patient has diabetes  Role Documenting the Problem One  (P) Care Management [place] for Problem One  (P) Active  THN Long Term Goal   (P) In thenext 31 days, patient will be able to plan 3 low carbohydrate meals as evidenced  by her   THN Long Term Goal Start Date  (P) 07/05/16  THN Long Term Goal Met Date  (P) 08/07/16  Interventions for Problem One Long Term Goal  (P) 7/11 patient was able to name 3 foods high in carbohydratesincluding breads, sugars adncakes  THN CM Short Term Goal #1   (P) In the next 21 days, patient will meet with this RNCM for diabetes education.  THN CM Short Term Goal #1 Start Date  (P) 07/05/16  THN CM Short Term Goal #1 Met Date  (P) 07/24/16  Interventions for Short Term Goal #1  (P) 6/27 home visit for diaetes education     Assessment:   RNCM assisted member is calculating the diabetes injection medicaton [place]. Call made to Courtney Goodman, Courtney Goodman to advise her of patient's confusion, ask assistance in coming up with a less stressful way for patient to calculate dosage for medicaiton. Courtney Goodman made call to Endocrinologist to advise doctor of patient's difficulty (pl Please see her note) By the end of the visit, patient was able to calculate and demonstrate with much coaching how to dial up the correct amount of insulin to inject as ordered by endocrinologist. This RNCM assist patient in removal of used/empty insulin pens.  Plan:  Home visit in the next 21 for case closure as patient has met her case management goals

## 2016-08-23 ENCOUNTER — Ambulatory Visit: Payer: Medicare HMO | Admitting: Endocrinology

## 2016-08-23 ENCOUNTER — Ambulatory Visit: Payer: Self-pay | Admitting: Pharmacist

## 2016-08-23 ENCOUNTER — Other Ambulatory Visit: Payer: Self-pay | Admitting: Endocrinology

## 2016-08-23 ENCOUNTER — Other Ambulatory Visit: Payer: Self-pay | Admitting: Family Medicine

## 2016-08-26 ENCOUNTER — Other Ambulatory Visit: Payer: Self-pay | Admitting: Family Medicine

## 2016-08-27 ENCOUNTER — Other Ambulatory Visit: Payer: Self-pay | Admitting: Licensed Clinical Social Worker

## 2016-08-27 NOTE — Patient Outreach (Signed)
[facility]) Care Management  08/27/2016  Shterna Laramee Clay 04-23-1945 [id number]  Assessment- CSW completed follow up call with patient and she answered. CSW questioned if patient was able to get her advance directives notarized yet. Patient states "I've had so much going on I haven't had the chance yet but I know what to do. I've been going to a lot of doctor appointments trying to get my medications figured out so that's been my main priority." CSW expressed understanding and provided education on the steps she would need to take in order to complete advance directives. Patient declines any further social work needs and is agreeable to social work discharge.  Plan-CSW will update [facility] Pharmacist and RNCM of discharge. CSW will also update PCP.  [name], BSW, MSW, [email or web address] Phone: [phone] Fax: [phone]

## 2016-08-28 ENCOUNTER — Ambulatory Visit: Payer: Medicare HMO | Admitting: Pharmacist

## 2016-08-30 ENCOUNTER — Other Ambulatory Visit: Payer: Self-pay | Admitting: Endocrinology

## 2016-09-03 ENCOUNTER — Other Ambulatory Visit: Payer: Self-pay

## 2016-09-03 NOTE — Patient Outreach (Addendum)
     This RNCM completed home visit for case closure. Patient has met her case management goals and denies additional case management goals at this time.  Patient was able to articulate and demonstrate her medication regimen clearer during this home visit. Patient states she was appreciative of this RNCM and Parmacist, Alwyn Ren Boyd's help in clarifying her medicaitons.

## 2016-09-04 ENCOUNTER — Other Ambulatory Visit: Payer: Self-pay | Admitting: Family Medicine

## 2016-09-04 NOTE — Telephone Encounter (Signed)
Is this okay to refill? 

## 2016-09-04 NOTE — Telephone Encounter (Signed)
Ok

## 2016-09-04 NOTE — Telephone Encounter (Signed)
Vickie's pt

## 2016-09-06 ENCOUNTER — Telehealth: Payer: Self-pay

## 2016-09-06 DIAGNOSIS — F419 Anxiety disorder, unspecified: Secondary | ICD-10-CM | POA: Diagnosis not present

## 2016-09-06 DIAGNOSIS — F331 Major depressive disorder, recurrent, moderate: Secondary | ICD-10-CM | POA: Diagnosis not present

## 2016-09-06 NOTE — Telephone Encounter (Signed)
Pharmacist, community at [facility] [place] Client Site [place] at [facility] [employer] Type Call Who Is Calling Patient / Member / Family / Caregiver Caller Name Koa Palla Phone Number [phone] Patient Name Courtney Goodman Call Type Message Only Information Provided Reason for Call Request to Reschedule Office Appointment Initial Comment Caller states has an appt tomorrow June 5th Tuesday scheduled for 2pm that she is needing to reschedule. Additional Comment Call Closed By: Delton Coombes Transaction Date/Time: 07/01/2016 5:07:16 PM (ET)

## 2016-09-12 ENCOUNTER — Other Ambulatory Visit: Payer: Self-pay | Admitting: Pharmacist

## 2016-09-12 NOTE — Patient Outreach (Signed)
Courtney Goodman) Care Management  09/12/2016  Courtney Goodman [birth year] [id number]   Patient was called to follow up on medication adherence. HIPAA identifiers were obtained.  Patient confirmed she received her medications in pill packing from [facility].  She also reported that she has an appointment with her PCP on 09/20/16.    Patient reported she is able to understand her pill pack now and is "getting back on track with my medications".  Plan:  Conduct a home visit one day before her provider visit to inspect patient's pill packs and make new recommendations for the patient's provider on 09/19/16.  Call patient one more time after her provider's visit then consider closing the pharmacy case as patient has received her medications in packing.   Courtney Goodman, PharmD, [place] Clinical Pharmacist [phone]

## 2016-09-16 DIAGNOSIS — F419 Anxiety disorder, unspecified: Secondary | ICD-10-CM | POA: Diagnosis not present

## 2016-09-16 DIAGNOSIS — F331 Major depressive disorder, recurrent, moderate: Secondary | ICD-10-CM | POA: Diagnosis not present

## 2016-09-19 ENCOUNTER — Other Ambulatory Visit: Payer: Self-pay | Admitting: Pharmacist

## 2016-09-19 NOTE — Patient Outreach (Signed)
[facility]) Care Management  [place]   09/19/2016  Courtney Goodman 29-Apr-1945 [id number]  Subjective: Home visit completed prior to patient's PCP appointment to help straighten out her mediations.  Patient is a 71 year old female with multiple medical conditions including but not limited to:   COPD, type 2 diabetes, hypertension, GAD, major depression, hyperlipidemia, OSA and vitamin deficiency.  Patient is using a pill pack system from [employer] and bottles of the medications that are not in the pack.  In addition, the patient shared she is confused about the pack and how to use it.  Objective:   Encounter Medications: Outpatient Encounter Prescriptions as of 09/19/2016  Medication Sig Note  . amLODipine-benazepril (LOTREL) 5-20 MG capsule TAKE 1 CAPSULE BY MOUTH EVERY DAY   . aspirin EC 81 MG tablet Take 81 mg by mouth every morning.    . Biotin 10000 MCG TABS Take 1 tablet by mouth every morning.   . Blood Glucose Monitoring Suppl (ACCU-CHEK AVIVA PLUS) w/Device KIT Test twice a day. Dx code E11.9   . carvedilol (COREG) 6.25 MG tablet Take 1 tablet (6.25 mg total) by mouth 2 (two) times daily.   . cholecalciferol (VITAMIN D) 1000 units tablet TAKE 1 TABLET BY MOUTH ONCE DAILY   . fenofibrate (TRICOR) 145 MG tablet Take 1 tablet (145 mg total) by mouth daily.   . ferrous sulfate 325 (65 FE) MG tablet TAKE 1 TABLET BY MOUTH EVERY DAY FOR IRON DEFICIENCY   . fluticasone (FLONASE) 50 MCG/ACT nasal spray Place 2 sprays into both nostrils daily.   . folic acid (FOLVITE) 1 MG tablet Take 1 tablet (1 mg total) by mouth daily.   . Insulin Detemir (LEVEMIR FLEXTOUCH) 100 UNIT/ML Pen Inject 190 Units into the skin every morning.   . liraglutide (VICTOZA) 18 MG/3ML SOPN Inject 1.8 mg daily.   Marland Kitchen omeprazole (PRILOSEC) 40 MG capsule Take 1 capsule (40 mg total) by mouth daily.   . simvastatin (ZOCOR) 20 MG tablet Take 1 tablet (20 mg total) by mouth at bedtime.   .  traMADol (ULTRAM) 50 MG tablet Take 1 tablet (50 mg total) by mouth daily as needed. 09/19/2016: PRN  . TRUE METRIX BLOOD GLUCOSE TEST test strip USE AS DIRECTED THREE TIMES A DAY   . [DISCONTINUED] cholecalciferol (VITAMIN D) 1000 units tablet Take 1 tablet (1,000 Units total) by mouth daily.   . [DISCONTINUED] fenofibrate (TRICOR) 145 MG tablet Take 1 tablet (145 mg total) by mouth daily.   . citalopram (CELEXA) 20 MG tablet Take 1 tablet (20 mg total) by mouth daily. (Patient not taking: Reported on 09/19/2016) 09/19/2016: Has run out   . [DISCONTINUED] ACCU-CHEK SOFTCLIX LANCETS lancets USE AS DIRECTED TO CHECK BLOOD SUGAR THREE TIMES A DAY (Patient not taking: Reported on 09/19/2016)   . [DISCONTINUED] amLODipine-benazepril (LOTREL) 5-20 MG capsule TAKE 1 CAPSULE BY MOUTH EVERY DAY   . [DISCONTINUED] amLODipine-benazepril (LOTREL) 5-20 MG capsule TAKE 1 CAPSULE BY MOUTH EVERY DAY   . [DISCONTINUED] LEVEMIR FLEXTOUCH 100 UNIT/ML Pen INJECT 150 UNITS SUBCUTANEOUSLY EVERY MORNING    No facility-administered encounter medications on file as of 09/19/2016.     Functional Status: In your present state of health, do you have any difficulty performing the following activities: 07/05/2016 06/21/2016  Hearing? N N  Comment - -  Vision? Y N  Difficulty concentrating or making decisions? N N  Walking or climbing stairs? Y N  Dressing or bathing? N N  Doing errands, shopping? Y -  Conservation officer, nature and eating ? N -  Using the Toilet? N -  In the past six months, have you accidently leaked urine? N -  Do you have problems with loss of bowel control? N -  Managing your Medications? N -  Managing your Finances? N -  Housekeeping or managing your Housekeeping? N -  Some recent data might be hidden    Fall/Depression Screening: Fall Risk  07/05/2016 07/01/2016 04/18/2016  Falls in the past year? Yes Yes No  Number falls in past yr: 2 or more 2 or more -  Injury with Fall? No No -  Risk Factor Category  High  Fall Risk High Fall Risk -  Risk for fall due to : History of fall(s);Impaired balance/gait;Impaired mobility;Impaired vision;Medication side effect History of fall(s);Impaired balance/gait;Impaired mobility;Medication side effect;Mental status change -  Follow up Falls prevention discussed;Follow up appointment Falls evaluation completed;Education provided -   [facility] 2/9 Scores 07/02/2016 07/01/2016 04/18/2016 03/12/2016 11/27/2015  PHQ - 2 Score 2 2 0 6 4  PHQ- 9 Score 4 4 - 18 10      Assessment:  Patient's medications were reviewed in her home. Patient has medications in a pill pack system from [place] as well as actual medication bottles from [place]. Patient's account with [facility] has been closed and unless she is getting an emergency medication, she can use [facility].  Adherence- Patient was VERY confused on how to use her Pill Pack.  A great deal of time was spent educating her to pay attention to the date and time of each packet on the spool.  After several demonstrations, the patient seemed to understand.  [employer] was called and the patient was educated about the OTC medication benefit and she was assisted with ordering:  Vitamin d  Aspirin 81 2 sharps containers Alcohol pads -[id number] Confirmation number [id number]  Plan:  Follow up with the patient after her trip and PCP appointment via telephone.

## 2016-09-20 ENCOUNTER — Ambulatory Visit (INDEPENDENT_AMBULATORY_CARE_PROVIDER_SITE_OTHER): Payer: Medicare HMO | Admitting: Family Medicine

## 2016-09-20 ENCOUNTER — Encounter: Payer: Self-pay | Admitting: Family Medicine

## 2016-09-20 VITALS — BP 120/60 | HR 76 | Wt 181.0 lb

## 2016-09-20 DIAGNOSIS — Z9119 Patient's noncompliance with other medical treatment and regimen: Secondary | ICD-10-CM

## 2016-09-20 DIAGNOSIS — M25562 Pain in left knee: Secondary | ICD-10-CM

## 2016-09-20 DIAGNOSIS — N183 Chronic kidney disease, stage 3 unspecified: Secondary | ICD-10-CM

## 2016-09-20 DIAGNOSIS — M79671 Pain in right foot: Secondary | ICD-10-CM | POA: Diagnosis not present

## 2016-09-20 DIAGNOSIS — Z794 Long term (current) use of insulin: Secondary | ICD-10-CM

## 2016-09-20 DIAGNOSIS — G8929 Other chronic pain: Secondary | ICD-10-CM

## 2016-09-20 DIAGNOSIS — Z91199 Patient's noncompliance with other medical treatment and regimen due to unspecified reason: Secondary | ICD-10-CM

## 2016-09-20 DIAGNOSIS — N189 Chronic kidney disease, unspecified: Secondary | ICD-10-CM

## 2016-09-20 DIAGNOSIS — F333 Major depressive disorder, recurrent, severe with psychotic symptoms: Secondary | ICD-10-CM

## 2016-09-20 DIAGNOSIS — D638 Anemia in other chronic diseases classified elsewhere: Secondary | ICD-10-CM

## 2016-09-20 DIAGNOSIS — I10 Essential (primary) hypertension: Secondary | ICD-10-CM

## 2016-09-20 DIAGNOSIS — E1122 Type 2 diabetes mellitus with diabetic chronic kidney disease: Secondary | ICD-10-CM

## 2016-09-20 DIAGNOSIS — E559 Vitamin D deficiency, unspecified: Secondary | ICD-10-CM | POA: Diagnosis not present

## 2016-09-20 NOTE — Patient Instructions (Addendum)
Continue checking your blood pressure and blood sugars at home.  Enjoy your vacation and follow up with your specialists as scheduled.   Return to see me in 3 months or sooner if you need me. COME IN FASTING (NOTHING TO EAT OR DRINK 6 HOURS BEFORE YOUR VISIT)   You can get the non- drowsy dramamine in case of motion sickness on your cruise.
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
## 2016-09-24 ENCOUNTER — Ambulatory Visit: Payer: Self-pay | Admitting: Pharmacist

## 2016-10-04 ENCOUNTER — Other Ambulatory Visit: Payer: Self-pay | Admitting: Family Medicine

## 2016-10-07 ENCOUNTER — Ambulatory Visit: Payer: Self-pay | Admitting: Pharmacist

## 2016-10-09 ENCOUNTER — Ambulatory Visit: Payer: Medicare HMO | Admitting: Endocrinology

## 2016-10-10 ENCOUNTER — Telehealth: Payer: Self-pay | Admitting: Endocrinology

## 2016-10-10 NOTE — Telephone Encounter (Signed)
Patient calling to ask question about insulin. Asking for return phone call when possible.

## 2016-10-10 NOTE — Telephone Encounter (Signed)
Called patient and clarified she daily takes the 190 units if Levemir.

## 2016-10-11 ENCOUNTER — Other Ambulatory Visit: Payer: Self-pay | Admitting: Pharmacist

## 2016-10-11 NOTE — Patient Outreach (Signed)
[facility]) Care Management  10/11/2016  Courtney Goodman Jun 02, 1945 [id number]   Called patient to follow up on pill packs. HIPAA identifiers were obtained. Patient reported having a great time on her cruise and being able to manage her medications while she was there.  She said she was able to take her pill packs with her without issue.  Patient did report having a low blood sugar yesterday of 66mg /dl.  She called Dr. Cordelia Pen office but they did not change her dose.    Patient was re-educated on hypoglycemia management today.    Patient saw her PCP on 09/20/16 and her PCP documented the following:  "She is here for a med check and to discuss chronic health conditions. She brought in her pill pack to have [place] review. She reports feeling much more organized with her medications. She has been receiving assistance from [facility] and this seems to have made a huge difference in her medication compliance and overall stress regarding her health"    Patient confirmed she has already received her next pill pack and has been paying attention to the dates on the box and each pack as she was instructed.   Plan: Call patient in 2 weeks. Close pharmacy case.   [name], PharmD, [place] Clinical Pharmacist [phone]

## 2016-10-16 ENCOUNTER — Ambulatory Visit: Payer: Medicare HMO | Admitting: Endocrinology

## 2016-10-18 DIAGNOSIS — E1129 Type 2 diabetes mellitus with other diabetic kidney complication: Secondary | ICD-10-CM | POA: Diagnosis not present

## 2016-10-18 DIAGNOSIS — D649 Anemia, unspecified: Secondary | ICD-10-CM | POA: Diagnosis not present

## 2016-10-18 DIAGNOSIS — N183 Chronic kidney disease, stage 3 (moderate): Secondary | ICD-10-CM | POA: Diagnosis not present

## 2016-10-18 DIAGNOSIS — F329 Major depressive disorder, single episode, unspecified: Secondary | ICD-10-CM | POA: Diagnosis not present

## 2016-10-18 DIAGNOSIS — Z6830 Body mass index (BMI) 30.0-30.9, adult: Secondary | ICD-10-CM | POA: Diagnosis not present

## 2016-10-21 ENCOUNTER — Telehealth: Payer: Self-pay | Admitting: Family Medicine

## 2016-10-21 NOTE — Telephone Encounter (Signed)
Pt called & asked that her labs be faxed to her kidney Dr. Florene Glen and this was faxed to T# [phone]

## 2016-10-22 ENCOUNTER — Encounter: Payer: Self-pay | Admitting: Podiatry

## 2016-10-22 ENCOUNTER — Ambulatory Visit (INDEPENDENT_AMBULATORY_CARE_PROVIDER_SITE_OTHER): Payer: Medicare HMO | Admitting: Podiatry

## 2016-10-22 DIAGNOSIS — B351 Tinea unguium: Secondary | ICD-10-CM

## 2016-10-22 DIAGNOSIS — E1142 Type 2 diabetes mellitus with diabetic polyneuropathy: Secondary | ICD-10-CM | POA: Diagnosis not present

## 2016-10-22 DIAGNOSIS — M79609 Pain in unspecified limb: Secondary | ICD-10-CM

## 2016-10-22 NOTE — Patient Instructions (Signed)

## 2016-10-22 NOTE — Progress Notes (Signed)
Patient ID: Courtney Goodman, female   DOB: April 16, 1945, 71 y.o.   MRN: [id number]    Subjective: This diabetic patient presents today for a scheduled visit complaining of uncomfortable toenails walking wearing shoes and request toenail debridement  Objective: Orientated 3 DP pulses 2/4 bilaterally PT pulses 1/4 bilaterally Capillary reflex immediate bilaterally Sensation to 10 g monofilament wire intact 5/5 bilaterally Vibratory sensation nonreactive bilaterally Ankle reflexes reactive bilaterally Well-healed surgical scar dorsal medial first MPJ right and medial lateral right ankle  No  Open skin lesions bilaterally Atrophic skin with absent hair growth bilaterally The toenails are elongated, hypertrophic, discolored, deformed and tender direct palpation 6-10 Manual motor testing dorsi flexion, plantar flexion 5/5 bilaterally  Assessment: Diabetic with loss of vibratory sensation//peripheral neuropathy Symptomatic onychomycoses 6-10  Plan: Debridement of toenails 6-10 mechanical he and allegedly without any bleeding  Reappoint 3 months

## 2016-10-24 ENCOUNTER — Other Ambulatory Visit: Payer: Self-pay | Admitting: Pharmacist

## 2016-10-24 NOTE — Patient Outreach (Addendum)
[facility]) Care Management  [place]   10/24/2016  Courtney Goodman 1945/04/16 [id number]  Subjective: Patient was called to follow up on medication adherence with pill packs. Patient is a 71 year old female with multiple medical conditions, including but not limited to:  COPD, type 2 diabetes, hypertension, GAD, major depression, hyperlipidemia, OSA and vitamin deficiency.  Patient is using a pill pack system from [employer].  Patient reported everything with the pill packs is going well and she understands how to use them.    Objective:  Fasting AM Blood sugar today 124 mg/dl  Encounter Medications: Outpatient Encounter Prescriptions as of 10/24/2016  Medication Sig Note  . amLODipine-benazepril (LOTREL) 5-20 MG capsule TAKE 1 CAPSULE BY MOUTH EVERY DAY   . aspirin EC 81 MG tablet Take 81 mg by mouth every morning.    . Biotin 10000 MCG TABS Take 1 tablet by mouth every morning.   . Blood Glucose Monitoring Suppl (ACCU-CHEK AVIVA PLUS) w/Device KIT Test twice a day. Dx code E11.9   . carvedilol (COREG) 6.25 MG tablet Take 1 tablet (6.25 mg total) by mouth 2 (two) times daily.   . cholecalciferol (VITAMIN D) 1000 units tablet TAKE 1 TABLET BY MOUTH ONCE DAILY   . citalopram (CELEXA) 20 MG tablet Take 1 tablet (20 mg total) by mouth daily. 09/19/2016: Has run out   . fenofibrate (TRICOR) 145 MG tablet Take 1 tablet (145 mg total) by mouth daily.   . ferrous sulfate 325 (65 FE) MG tablet TAKE 1 TABLET BY MOUTH EVERY DAY FOR IRON DEFICIENCY   . fluticasone (FLONASE) 50 MCG/ACT nasal spray Place 2 sprays into both nostrils daily.   . folic acid (FOLVITE) 1 MG tablet Take 1 tablet (1 mg total) by mouth daily.   . Insulin Detemir (LEVEMIR FLEXTOUCH) 100 UNIT/ML Pen Inject 190 Units into the skin every morning.   . liraglutide (VICTOZA) 18 MG/3ML SOPN Inject 1.8 mg daily.   Marland Kitchen omeprazole (PRILOSEC) 40 MG capsule Take 1 capsule (40 mg total) by mouth daily.   .  simvastatin (ZOCOR) 20 MG tablet Take 1 tablet (20 mg total) by mouth at bedtime.   . traMADol (ULTRAM) 50 MG tablet Take 1 tablet (50 mg total) by mouth daily as needed. 09/19/2016: PRN  . TRUE METRIX BLOOD GLUCOSE TEST test strip USE AS DIRECTED THREE TIMES A DAY    No facility-administered encounter medications on file as of 10/24/2016.     Functional Status: In your present state of health, do you have any difficulty performing the following activities: 07/05/2016 06/21/2016  Hearing? N N  Comment - -  Vision? Y N  Difficulty concentrating or making decisions? N N  Walking or climbing stairs? Y N  Dressing or bathing? N N  Doing errands, shopping? Y -  Conservation officer, nature and eating ? N -  Using the Toilet? N -  In the past six months, have you accidently leaked urine? N -  Do you have problems with loss of bowel control? N -  Managing your Medications? N -  Managing your Finances? N -  Housekeeping or managing your Housekeeping? N -  Some recent data might be hidden    Fall/Depression Screening: Fall Risk  09/19/2016 07/05/2016 07/01/2016  Falls in the past year? No Yes Yes  Number falls in past yr: - 2 or more 2 or more  Injury with Fall? - No No  Risk Factor Category  - High Fall Risk  High Fall Risk  Risk for fall due to : - History of fall(s);Impaired balance/gait;Impaired mobility;Impaired vision;Medication side effect History of fall(s);Impaired balance/gait;Impaired mobility;Medication side effect;Mental status change  Follow up - Falls prevention discussed;Follow up appointment Falls evaluation completed;Education provided   [facility] 2/9 Scores 09/19/2016 07/02/2016 07/01/2016 04/18/2016 03/12/2016 11/27/2015  PHQ - 2 Score _0 0 6 4  PHQ- 9 Score - 4 4 - 18 10      Assessment: Patient's medications were reviewed via telephone.  Patient saw the podiatrist 10/23/16 but did not have any medication changes.  Patient has an appointment with Dr. Loanne Drilling on 11/04/16.     Plan:  Follow  up with patient after her endocrinology visit. Consider closing pharmacy case.

## 2016-11-04 ENCOUNTER — Ambulatory Visit (INDEPENDENT_AMBULATORY_CARE_PROVIDER_SITE_OTHER): Payer: Medicare HMO | Admitting: Endocrinology

## 2016-11-04 ENCOUNTER — Encounter: Payer: Self-pay | Admitting: Endocrinology

## 2016-11-04 VITALS — BP 124/62 | HR 77 | Wt 181.0 lb

## 2016-11-04 DIAGNOSIS — N183 Chronic kidney disease, stage 3 (moderate): Secondary | ICD-10-CM

## 2016-11-04 DIAGNOSIS — Z794 Long term (current) use of insulin: Secondary | ICD-10-CM

## 2016-11-04 DIAGNOSIS — E1122 Type 2 diabetes mellitus with diabetic chronic kidney disease: Secondary | ICD-10-CM | POA: Diagnosis not present

## 2016-11-04 LAB — POCT GLYCOSYLATED HEMOGLOBIN (HGB A1C): Hemoglobin A1C: 6.1

## 2016-11-04 MED ORDER — INSULIN DETEMIR 100 UNIT/ML FLEXPEN: 180.0000 [IU] | PEN_INJECTOR | SUBCUTANEOUS | 3 refills | Status: DC

## 2016-11-04 NOTE — Patient Instructions (Addendum)
check your blood sugar twice a day.  vary the time of day when you check, between before the 3 meals, and at bedtime.  also check if you have symptoms of your blood sugar being too high or too low.  please keep a record of the readings and bring it to your next appointment here (or you can bring the meter itself).  You can write it on any piece of paper.  please call us sooner if your blood sugar goes below 70, or if you have a lot of readings over 200.   please reduce the levemir to 180 units each morning.   On this type of insulin schedule, you should eat meals on a regular schedule.  If a meal is missed or significantly delayed, your blood sugar could go low.  Please come back for a follow-up appointment in 4 months.

## 2016-11-04 NOTE — Progress Notes (Signed)
Subjective:    Patient ID: Courtney Goodman, female    DOB: 1945/05/29, 71 y.o.   MRN: [id number]  HPI Pt returns for f/u of diabetes mellitus: DM type: Insulin-requiring type 2 Dx'ed: 4259 Complications: renal insufficiency and CAD Therapy: insulin since 2009, and victoza. GDM: never DKA: never Severe hypoglycemia: once (2012). Pancreatitis: never.  Other: she is on qd insulin, due to h/o noncompliance.   Interval history: no cbg record, but states cbg's vary from 55-181.  There is no trend throughout the day, but it is lowest when a meal is missed.  pt states she feels well in general.   Past Medical History:  Diagnosis Date  . Anemia of chronic disease   . Asthma   . Atherosclerotic heart disease of native coronary artery without angina pectoris   . COPD (chronic obstructive pulmonary disease) ([place])   . DM (diabetes mellitus), type 2 with renal complications ([place])   . Dysrhythmia   . GAD (generalized anxiety disorder)   . Hypertension   . Mixed hyperlipidemia due to type 2 diabetes mellitus ([place])   . Mixed incontinence    per medical records from [place]  . Osteopenia   . Personal history of noncompliance with medical treatment, presenting hazards to health   . Shortness of breath dyspnea   . Suicidal ideation   . TB (tuberculosis), treated    age 71  . Vitamin D deficiency     Past Surgical History:  Procedure Laterality Date  . ANKLE RECONSTRUCTION     right  . CARPAL TUNNEL RELEASE    . CORONARY ANGIOPLASTY      Social History   Social History  . Marital status: Divorced    Spouse name: N/A  . Number of children: N/A  . Years of education: N/A   Occupational History  . Not on file.   Social History Main Topics  . Smoking status: Former Research scientist (life sciences)  . Smokeless tobacco: Never Used  . Alcohol use No  . Drug use: No  . Sexual activity: No   Other Topics Concern  . Not on file   Social History Narrative  . No narrative on file    Current Outpatient  Prescriptions on File Prior to Visit  Medication Sig Dispense Refill  . amLODipine-benazepril (LOTREL) 5-20 MG capsule TAKE 1 CAPSULE BY MOUTH EVERY DAY 90 capsule 0  . aspirin EC 81 MG tablet Take 81 mg by mouth every morning.     . Biotin 10000 MCG TABS Take 1 tablet by mouth every morning.    . Blood Glucose Monitoring Suppl (ACCU-CHEK AVIVA PLUS) w/Device KIT Test twice a day. Dx code E11.9 1 kit 0  . carvedilol (COREG) 6.25 MG tablet Take 1 tablet (6.25 mg total) by mouth 2 (two) times daily. 180 tablet 0  . cholecalciferol (VITAMIN D) 1000 units tablet TAKE 1 TABLET BY MOUTH ONCE DAILY 30 tablet 2  . citalopram (CELEXA) 20 MG tablet Take 1 tablet (20 mg total) by mouth daily. 90 tablet 1  . fenofibrate (TRICOR) 145 MG tablet Take 1 tablet (145 mg total) by mouth daily. 90 tablet 1  . ferrous sulfate 325 (65 FE) MG tablet TAKE 1 TABLET BY MOUTH EVERY DAY FOR IRON DEFICIENCY 90 tablet 1  . fluticasone (FLONASE) 50 MCG/ACT nasal spray Place 2 sprays into both nostrils daily. 48 g 0  . folic acid (FOLVITE) 1 MG tablet Take 1 tablet (1 mg total) by mouth daily. 90 tablet 1  .  liraglutide (VICTOZA) 18 MG/3ML SOPN Inject 1.8 mg daily. 6 mL 2  . omeprazole (PRILOSEC) 40 MG capsule Take 1 capsule (40 mg total) by mouth daily. 90 capsule 1  . simvastatin (ZOCOR) 20 MG tablet Take 1 tablet (20 mg total) by mouth at bedtime. 90 tablet 0  . traMADol (ULTRAM) 50 MG tablet Take 1 tablet (50 mg total) by mouth daily as needed. 90 tablet 0  . TRUE METRIX BLOOD GLUCOSE TEST test strip USE AS DIRECTED THREE TIMES A DAY 100 each 2   No current facility-administered medications on file prior to visit.     No Known Allergies  Family History  Problem Relation Age of Onset  . Diabetes Mother     BP 124/62   Pulse 77   Wt 181 lb (82.1 kg)   SpO2 98%   BMI 31.07 kg/m    Review of Systems Denies LOC    Objective:   Physical Exam VITAL SIGNS:  See vs page.  GENERAL: no distress.   Pulses: foot  pulses are intact bilaterally.   MSK: no deformity of the feet or ankles.  CV: no edema of the legs or ankles Skin:  no ulcer on the feet or ankles.  normal color and temp on the feet and ankles.  Old healed surgical scars on the right ankle and foot.  Neuro: sensation is intact to touch on the feet and ankles.   Ext: There is bilateral onychomycosis of the toenails, and some are ingrown.    A1c=6.1%    Assessment & Plan:  Insulin-requiring type 2 DM, with CAD: overcontrolled, given this regimen, which does match insulin to her changing needs throughout the day.   Patient Instructions  check your blood sugar twice a day.  vary the time of day when you check, between before the 3 meals, and at bedtime.  also check if you have symptoms of your blood sugar being too high or too low.  please keep a record of the readings and bring it to your next appointment here (or you can bring the meter itself).  You can write it on any piece of paper.  please call us sooner if your blood sugar goes below 70, or if you have a lot of readings over 200.   please reduce the levemir to 180 units each morning.   On this type of insulin schedule, you should eat meals on a regular schedule.  If a meal is missed or significantly delayed, your blood sugar could go low.  Please come back for a follow-up appointment in 4 months.

## 2016-11-04 NOTE — Progress Notes (Signed)
61

## 2016-11-05 ENCOUNTER — Other Ambulatory Visit: Payer: Self-pay | Admitting: Pharmacist

## 2016-11-05 NOTE — Patient Outreach (Signed)
[facility]) Care Management  11/05/2016  Zendaya [place] 16-Feb-1945 [id number]   Patient was called to follow up after her appointment with Dr. Loanne Drilling. HIPAA identifiers were obtained.  Patient was very excited to report her HgA1c decreased to 6.1% from 8.6%.  Dr. Loanne Drilling decreased her Levemir dose from 190 units daily to 180 units daily.  Patient was also excited that she has been able to use her Pill Packs as prescribed and is happy with the service.    Plan: Since patient has been successfully using her Pill Packs and has decreased her HgA1c, her pharmacy case will be closed.  Patient understands she can call me at anytime with medication questions or concerns.  Letters will be sent to the patient and her PCP to document the case closure.  [name], PharmD, [place] Clinical Pharmacist [phone]

## 2016-11-18 DIAGNOSIS — F330 Major depressive disorder, recurrent, mild: Secondary | ICD-10-CM | POA: Diagnosis not present

## 2016-11-20 ENCOUNTER — Telehealth: Payer: Self-pay | Admitting: Family Medicine

## 2016-11-20 NOTE — Telephone Encounter (Signed)
Recvd refill request forTrazodone 50mg  from [facility]

## 2016-11-20 NOTE — Telephone Encounter (Signed)
Spoke to patient and therapist prescribed this yesterday for her so pt was going to call her and get this from them as we will not refill this

## 2016-11-20 NOTE — Telephone Encounter (Signed)
I do not see Trazodone on her medication list. Can you check in to this please?

## 2016-12-03 ENCOUNTER — Other Ambulatory Visit: Payer: Self-pay | Admitting: Family Medicine

## 2016-12-06 ENCOUNTER — Telehealth: Payer: Self-pay | Admitting: Endocrinology

## 2016-12-06 ENCOUNTER — Other Ambulatory Visit: Payer: Self-pay

## 2016-12-06 MED ORDER — INSULIN DETEMIR 100 UNIT/ML FLEXPEN: 180.0000 [IU] | PEN_INJECTOR | SUBCUTANEOUS | 3 refills | Status: DC

## 2016-12-06 NOTE — Telephone Encounter (Signed)
Pt called she is at her daughters house and did not bring enough insulin. Pt will not be leaving until next Thursday. Pt needs a refill sent to Pharmacy is [facility] [address] [place], [place] [address] [phone]  Call pt @ [phone]. Thank you!

## 2016-12-06 NOTE — Telephone Encounter (Signed)
Called patient & sent Levemir to pharmacy in [place].

## 2016-12-31 ENCOUNTER — Other Ambulatory Visit: Payer: Self-pay | Admitting: Endocrinology

## 2016-12-31 ENCOUNTER — Telehealth: Payer: Self-pay | Admitting: Family Medicine

## 2016-12-31 NOTE — Telephone Encounter (Signed)
Forwarded

## 2016-12-31 NOTE — Telephone Encounter (Signed)
Rcvd refill request from [place] at [facility] for Tramadol 50 mg

## 2016-12-31 NOTE — Telephone Encounter (Signed)
Please handle this for her.

## 2017-01-01 ENCOUNTER — Ambulatory Visit (INDEPENDENT_AMBULATORY_CARE_PROVIDER_SITE_OTHER): Payer: Medicare HMO | Admitting: Family Medicine

## 2017-01-01 ENCOUNTER — Encounter: Payer: Self-pay | Admitting: Family Medicine

## 2017-01-01 ENCOUNTER — Other Ambulatory Visit: Payer: Self-pay | Admitting: Family Medicine

## 2017-01-01 ENCOUNTER — Telehealth: Payer: Self-pay | Admitting: Family Medicine

## 2017-01-01 ENCOUNTER — Telehealth: Payer: Self-pay | Admitting: Endocrinology

## 2017-01-01 VITALS — BP 126/78 | HR 88 | Ht 64.0 in | Wt 184.0 lb

## 2017-01-01 DIAGNOSIS — R7989 Other specified abnormal findings of blood chemistry: Secondary | ICD-10-CM | POA: Diagnosis not present

## 2017-01-01 DIAGNOSIS — E782 Mixed hyperlipidemia: Secondary | ICD-10-CM

## 2017-01-01 DIAGNOSIS — Z79899 Other long term (current) drug therapy: Secondary | ICD-10-CM

## 2017-01-01 DIAGNOSIS — D638 Anemia in other chronic diseases classified elsewhere: Secondary | ICD-10-CM | POA: Diagnosis not present

## 2017-01-01 DIAGNOSIS — I10 Essential (primary) hypertension: Secondary | ICD-10-CM | POA: Diagnosis not present

## 2017-01-01 DIAGNOSIS — F411 Generalized anxiety disorder: Secondary | ICD-10-CM | POA: Diagnosis not present

## 2017-01-01 DIAGNOSIS — N189 Chronic kidney disease, unspecified: Secondary | ICD-10-CM | POA: Diagnosis not present

## 2017-01-01 DIAGNOSIS — E1169 Type 2 diabetes mellitus with other specified complication: Secondary | ICD-10-CM | POA: Diagnosis not present

## 2017-01-01 NOTE — Telephone Encounter (Signed)
Spoke with patient & informed her of her last flu vaccine date.

## 2017-01-01 NOTE — Telephone Encounter (Signed)
Patient needs to know when and if she has had the flu shot. Please call patient at ph# [phone]

## 2017-01-01 NOTE — Patient Instructions (Addendum)
Your blood pressure today is 126/78.  This is in goal range.  Continue checking your blood pressure at home periodically.  If you are not seen consistent readings less than 130/80 then let me know.  We will call you with your lab results.  You will be due for a Medicare annual wellness visit in April 2019.

## 2017-01-01 NOTE — Telephone Encounter (Signed)
Pt called and stated she takes Ferrous sulfate 145 mg and vitamin d 1000 mg. She states she was to call you and let you know.

## 2017-01-01 NOTE — Progress Notes (Signed)
Subjective:    Patient ID: Courtney Goodman, female    DOB: 1945-04-16, 71 y.o.   MRN: [id number]  HPI Chief Complaint  Patient presents with  . Diabetes    follow up   She is here to follow up on HTN and other chronic health conditions.  Denies any concerns or complaints today. States she is checking her BP at home and her readings have been less than 130/80. She is using pill pack and denies any issues with medications.   She is seeing Dr. Loanne Drilling for diabetes. States her blood sugars have improved.  Denies having any low blood sugars recently.  She is fasting and would like to have her cholesterol checked. Taking simvastatin and fenofibrate without any concerns.   She has a history of anemia and has been taking iron 325 mg daily.  Review of her lab shows that her ferritin level was elevated in February 2018.   Denies fever, chills, dizziness, chest pain, palpitations, shortness of breath, abdominal pain, N/V/D, urinary symptoms, LE edema. Denies myalgias.   She has a history of sleep apnea. Is not using CPAP and states she sleeps well without it. Has not used it in over a year.   Seeing a therapist. States her mood is stable.   Depression screen [facility] 2/9 01/01/2017 09/19/2016 07/02/2016 07/01/2016 04/18/2016  Decreased Interest 0 1 1 1  0  Down, Depressed, Hopeless 0 0 1 1 0  PHQ - 2 Score 0 1 2 2  0  Altered sleeping - - 0 0 -  Tired, decreased energy - - 1 1 -  Change in appetite - - 1 1 -  Feeling bad or failure about yourself  - - 0 0 -  Trouble concentrating - - 0 0 -  Moving slowly or fidgety/restless - - 0 0 -  Suicidal thoughts - - 0 - -  PHQ-9 Score - - 4 4 -  Difficult doing work/chores - - Not difficult at all Not difficult at all -     Reviewed allergies, medications, past medical, surgical, family, and social history.    Review of Systems Pertinent positives and negatives in the history of present illness.     Objective:   Physical Exam BP 126/78   Pulse 88    Ht 5\' 4"  (1.626 m)   Wt 184 lb (83.5 kg)   SpO2 97%   BMI 31.58 kg/m   Alert and in no distress. Pharyngeal area is normal. Neck is supple without adenopathy or thyromegaly. Cardiac exam shows a regular sinus rhythm without murmurs or gallops. Lungs are clear to auscultation.  Extremities without edema.      Assessment & Plan:  Essential hypertension - Plan: CBC with Differential/Platelet, COMPLETE METABOLIC PANEL WITH GFR  Mixed hyperlipidemia due to type 2 diabetes mellitus ([place]) - Plan: Lipid panel  Chronic kidney disease, unspecified CKD stage - Plan: CBC with Differential/Platelet, COMPLETE METABOLIC PANEL WITH GFR  Anemia of chronic disease - Plan: CBC with Differential/Platelet, COMPLETE METABOLIC PANEL WITH GFR, Iron, TIBC and Ferritin Panel  Elevated ferritin level - Plan: Iron, TIBC and Ferritin Panel  Medication management - Plan: CBC with Differential/Platelet, COMPLETE METABOLIC PANEL WITH GFR, Lipid panel, Iron, TIBC and Ferritin Panel  GAD (generalized anxiety disorder)  Blood pressure is in goal range today.  She appears to be doing well.  She is now using the pill pack and this has made medication management easier for her.  Denies any issues with her medications. She  is fasting and we will check her lipid panel today. Her mood appears stable.  No sign of depression or anxiety today. She continues taking daily iron and we will repeat CBC and iron studies today.  She had an elevated ferritin level which we did stop her iron for a period of time and it is unclear when she started back taking iron. Follow-up pending labs.  She will continue seeing Dr. Lissa Merlin for diabetes management.  She is pleased with her recent blood sugars and medication regimen for this. Due for AWV after April 19 2017.

## 2017-01-02 LAB — CBC WITH DIFFERENTIAL/PLATELET
Basophils Absolute: 53 cells/uL (ref 0–200)
Basophils Relative: 0.7 %
Eosinophils Absolute: 180 cells/uL (ref 15–500)
Eosinophils Relative: 2.4 %
HCT: 31.5 % — ABNORMAL LOW (ref 35.0–45.0)
Hemoglobin: 10.4 g/dL — ABNORMAL LOW (ref 11.7–15.5)
Lymphs Abs: 1440 cells/uL (ref 850–3900)
MCH: 30.1 pg (ref 27.0–33.0)
MCHC: 33 g/dL (ref 32.0–36.0)
MCV: 91 fL (ref 80.0–100.0)
MPV: 11.2 fL (ref 7.5–12.5)
Monocytes Relative: 6.9 %
Neutro Abs: 5310 cells/uL (ref 1500–7800)
Neutrophils Relative %: 70.8 %
Platelets: 373 10*3/uL (ref 140–400)
RBC: 3.46 10*6/uL — ABNORMAL LOW (ref 3.80–5.10)
RDW: 13.8 % (ref 11.0–15.0)
Total Lymphocyte: 19.2 %
WBC mixed population: 518 cells/uL (ref 200–950)
WBC: 7.5 10*3/uL (ref 3.8–10.8)

## 2017-01-02 LAB — COMPLETE METABOLIC PANEL WITH GFR
AG Ratio: 1.1 (calc) (ref 1.0–2.5)
ALT: 12 U/L (ref 6–29)
AST: 19 U/L (ref 10–35)
Albumin: 3.8 g/dL (ref 3.6–5.1)
Alkaline phosphatase (APISO): 48 U/L (ref 33–130)
BUN/Creatinine Ratio: 17 (calc) (ref 6–22)
BUN: 27 mg/dL — ABNORMAL HIGH (ref 7–25)
CO2: 30 mmol/L (ref 20–32)
Calcium: 10 mg/dL (ref 8.6–10.4)
Chloride: 104 mmol/L (ref 98–110)
Creat: 1.56 mg/dL — ABNORMAL HIGH (ref 0.60–0.93)
GFR, Est African American: 38 mL/min/{1.73_m2} — ABNORMAL LOW (ref >=60)
GFR, Est Non African American: 33 mL/min/{1.73_m2} — ABNORMAL LOW (ref >=60)
Globulin: 3.4 g/dL (calc) (ref 1.9–3.7)
Glucose, Bld: 124 mg/dL — ABNORMAL HIGH (ref 65–99)
Potassium: 4.3 mmol/L (ref 3.5–5.3)
Sodium: 139 mmol/L (ref 135–146)
Total Bilirubin: 0.4 mg/dL (ref 0.2–1.2)
Total Protein: 7.2 g/dL (ref 6.1–8.1)

## 2017-01-02 LAB — LIPID PANEL
Cholesterol: 117 mg/dL
HDL: 34 mg/dL — ABNORMAL LOW
LDL Cholesterol (Calc): 66 mg/dL
Non-HDL Cholesterol (Calc): 83 mg/dL
Total CHOL/HDL Ratio: 3.4 (calc)
Triglycerides: 88 mg/dL

## 2017-01-02 LAB — IRON,TIBC AND FERRITIN PANEL
%SAT: 20 % (ref 11–50)
Ferritin: 393 ng/mL — ABNORMAL HIGH (ref 20–288)
Iron: 63 ug/dL (ref 45–160)
TIBC: 316 ug/dL (ref 250–450)

## 2017-01-03 ENCOUNTER — Telehealth: Payer: Self-pay

## 2017-01-03 ENCOUNTER — Other Ambulatory Visit: Payer: Self-pay | Admitting: Family Medicine

## 2017-01-03 ENCOUNTER — Other Ambulatory Visit (INDEPENDENT_AMBULATORY_CARE_PROVIDER_SITE_OTHER): Payer: Medicare HMO

## 2017-01-03 ENCOUNTER — Encounter: Payer: Self-pay | Admitting: Family Medicine

## 2017-01-03 DIAGNOSIS — Z23 Encounter for immunization: Secondary | ICD-10-CM

## 2017-01-03 DIAGNOSIS — R7989 Other specified abnormal findings of blood chemistry: Secondary | ICD-10-CM | POA: Insufficient documentation

## 2017-01-03 HISTORY — DX: Other specified abnormal findings of blood chemistry: R79.89

## 2017-01-03 NOTE — Telephone Encounter (Signed)
Called patient and gave lab results. Patient had no questions or concerns. I will order and schedule [place] for patient, she is coming to the office today to get her flu shot and I will let her know when she arrives.   Can you please place the order for the [place] and I will call and get her scheduled for this as well. Thanks!

## 2017-01-03 NOTE — Progress Notes (Unsigned)
   Subjective:    Patient ID: Courtney Goodman, female    DOB: 28-Jan-1946, 71 y.o.   MRN: [id number]  HPI    Review of Systems     Objective:   Physical Exam        Assessment & Plan:

## 2017-01-03 NOTE — Telephone Encounter (Signed)
-----   Message from [name], NP-C sent at 01/03/2017  8:31 AM EST ----- Please let her know that her iron is still elevated. Have her stop taking iron (ferrous sulfate). Please let her know that I am sending her for an [place] of her abdomen to look at her liver to see if this is the reason for her elevated iron. Please schedule a RUQ- limited US for her due to elevated ferritin.

## 2017-01-03 NOTE — Telephone Encounter (Signed)
Order is in the system now.

## 2017-01-03 NOTE — Telephone Encounter (Signed)
Called patient and advised that I had scheduled her [place], patient understood and had no questions at this time.

## 2017-01-07 ENCOUNTER — Telehealth: Payer: Self-pay | Admitting: Family Medicine

## 2017-01-07 NOTE — Telephone Encounter (Signed)
Please call and find out why she is taking Tramadol and how often she is taking it. I do not see where I have prescribed this for her in the past.

## 2017-01-07 NOTE — Telephone Encounter (Signed)
Pill pack is req refill for Tramadol HCL 50 mg tab

## 2017-01-08 ENCOUNTER — Telehealth: Payer: Self-pay

## 2017-01-08 NOTE — Telephone Encounter (Signed)
Called patient and LVM advising that we had received a refill request for Tramadol, I was calling to check with patient on why she was taking it and how often. I gave patient call back number to discuss.

## 2017-01-08 NOTE — Telephone Encounter (Signed)
Called pt she is taking the tramadol every night for stomach pain

## 2017-01-08 NOTE — Telephone Encounter (Signed)
I will need to see her before continuing to keep her on this medication for stomach pain.

## 2017-01-09 NOTE — Telephone Encounter (Signed)
Called l/m for pt to call us back.

## 2017-01-10 ENCOUNTER — Ambulatory Visit
Admission: RE | Admit: 2017-01-10 | Discharge: 2017-01-10 | Disposition: A | Discharge status: Home / Self Care | Payer: Medicare HMO | Source: Ambulatory Visit | Attending: Family Medicine | Admitting: Family Medicine

## 2017-01-10 ENCOUNTER — Telehealth: Payer: Self-pay

## 2017-01-10 DIAGNOSIS — R7989 Other specified abnormal findings of blood chemistry: Secondary | ICD-10-CM | POA: Diagnosis not present

## 2017-01-10 NOTE — Telephone Encounter (Signed)
-----   Message from [name], NP-C sent at 01/10/2017 10:33 AM EST ----- Please call patient and find out what time she ate prior to the [place].

## 2017-01-10 NOTE — Telephone Encounter (Signed)
Called and advised with patient on when she ate last, last meal was at 6:00 pm last night, nothing to eat or drink since then.

## 2017-01-13 ENCOUNTER — Ambulatory Visit (INDEPENDENT_AMBULATORY_CARE_PROVIDER_SITE_OTHER): Payer: Medicare HMO | Admitting: Family Medicine

## 2017-01-13 ENCOUNTER — Encounter: Payer: Self-pay | Admitting: Family Medicine

## 2017-01-13 VITALS — BP 120/68 | HR 64 | Ht 64.0 in | Wt 179.2 lb

## 2017-01-13 DIAGNOSIS — R6881 Early satiety: Secondary | ICD-10-CM | POA: Diagnosis not present

## 2017-01-13 DIAGNOSIS — R1084 Generalized abdominal pain: Secondary | ICD-10-CM | POA: Diagnosis not present

## 2017-01-13 DIAGNOSIS — D638 Anemia in other chronic diseases classified elsewhere: Secondary | ICD-10-CM | POA: Diagnosis not present

## 2017-01-13 DIAGNOSIS — R1011 Right upper quadrant pain: Secondary | ICD-10-CM

## 2017-01-13 DIAGNOSIS — R935 Abnormal findings on diagnostic imaging of other abdominal regions, including retroperitoneum: Secondary | ICD-10-CM | POA: Diagnosis not present

## 2017-01-13 DIAGNOSIS — R140 Abdominal distension (gaseous): Secondary | ICD-10-CM

## 2017-01-13 DIAGNOSIS — R7989 Other specified abnormal findings of blood chemistry: Secondary | ICD-10-CM | POA: Diagnosis not present

## 2017-01-13 NOTE — Progress Notes (Signed)
   Subjective:    Patient ID: Courtney Goodman, female    DOB: 01/01/1946, 71 y.o.   MRN: [id number]  HPI Chief Complaint  Patient presents with  . Follow-up    here to go over u/s results from Friday. States that balance is still off in the mornings.    She is here with complaints of intermittent diffuse abdominal pain that is worse after eating. This has been ongoing for several months but states she has not discussed symptoms with anyone. States she takes Tramadol for this pain occasionally. Pain is nonradiating. Also reports associated intermittent loose stools, bloating, early satiety and belching worse after eating, especially fatty foods. Her daughter is with her today. Denies abdominal pain today. She has not yet eaten today.   Denies blood or pus in stool.  No fever, chills, fatigue, unexplained weight loss, chest pain, palpitations, shortness of breath, cough, orthopnea, vomiting, urinary symptoms, LE edema.    History of anemia, normal iron level. Suspected anemia of chronic disease. Elevated ferritin.  Stopped taking iron supplement last week.   She was fasting prior to Korea for at least 10 hours.  Recent RUQ [place] to check liver shows the following:   IMPRESSION: 1. Gallbladder appears somewhat contracted without focal gallbladder lesion evident. Etiology for somewhat contracted gallbladder appearance is uncertain. If patient has not been NPO for minimum of 8 hours, post-prandial state could potentially account for this appearance. If there remains concern for potential gallbladder pathology, a repeat study after confirmed minimum of 8 hours fasting may be beneficial. Alternatively, a nuclear medicine hepatobiliary imaging study potentially could be helpful in this circumstance.  2. No evident liver lesions. The liver echotexture appears unremarkable by ultrasound.  Electronically Signed By: Lowella Grip III M.D. On: 01/10/2017 10:08  Reviewed allergies,  medications, past medical, surgical, family, and social history.    Review of Systems Pertinent positives and negatives in the history of present illness.     Objective:   Physical Exam BP 120/68   Pulse 64   Ht 5\' 4"  (1.626 m)   Wt 179 lb 3.2 oz (81.3 kg)   BMI 30.76 kg/m  Alert and in no distress.  Pharyngeal area is normal. Neck is supple without adenopathy or thyromegaly. Cardiac exam shows a regular sinus rhythm without murmurs or gallops. Lungs are clear to auscultation.  Abdomen soft, nondistended, normal bowel sounds, nontender, no guarding.  No CVA tenderness. Extremities without edema.      Assessment & Plan:  Abnormal [place] (ultrasound) of abdomen - Plan: NM Hepato W/Eject Fract  Anemia of chronic disease  Elevated ferritin  Generalized abdominal pain - Plan: NM Hepato W/Eject Fract  Early satiety - Plan: NM Hepato W/Eject Fract  Postprandial bloating - Plan: NM Hepato W/Eject Fract  Right upper quadrant pain - Plan: NM Hepato W/Eject Fract  She is not in any acute distress.  Exam is benign. Discussed that symptoms may be related to her gallbladder and since she had the recent abnormal ultrasound and was fasting that we will send her for HIDA scan.  She does not want this test done prior to Christmas day. She will not start back on iron for now.  We will see if ferritin level responds and will also see if it is related to persistent gallbladder symptoms.  Discussed that this is often an acute phase reactant. Follow-up pending ultrasound.

## 2017-01-14 ENCOUNTER — Ambulatory Visit: Payer: Medicare HMO | Admitting: Podiatry

## 2017-01-22 ENCOUNTER — Telehealth: Payer: Self-pay | Admitting: Family Medicine

## 2017-01-22 ENCOUNTER — Other Ambulatory Visit: Payer: Self-pay

## 2017-01-22 MED ORDER — TRAMADOL HCL 50 MG PO TABS: 50.0000 mg | ORAL_TABLET | Freq: Every day | ORAL | 0 refills | Status: DC | PRN

## 2017-01-22 NOTE — Telephone Encounter (Signed)
Ok to refill #90, no additional refills.

## 2017-01-22 NOTE — Telephone Encounter (Signed)
Rcvd refill request for Tramadol 50 mg from [facility]

## 2017-01-23 ENCOUNTER — Other Ambulatory Visit: Payer: Self-pay

## 2017-01-23 MED ORDER — TRAMADOL HCL 50 MG PO TABS
50.0000 mg | ORAL_TABLET | Freq: Every day | ORAL | 0 refills | Status: DC | PRN
Start: 2017-01-23 — End: 2017-04-03

## 2017-01-23 NOTE — Telephone Encounter (Signed)
Printed, will fax once signed.

## 2017-01-23 NOTE — Telephone Encounter (Signed)
Called in the RX instead of faxing. RX was submitted.

## 2017-01-29 ENCOUNTER — Telehealth: Payer: Self-pay | Admitting: Family Medicine

## 2017-01-29 NOTE — Telephone Encounter (Signed)
On phone with [employer] 1 hr 5 min and found out no prior auth is required for [employer] with [facility] per Hyacinth Meeker ref# [id number]   For [phone] Hida scan.  Called [place] [facility] for [place] [phone] and she is aware now.  Facility Fed id [id number] NPI [id number] for [facility]

## 2017-01-30 ENCOUNTER — Encounter (HOSPITAL_COMMUNITY)
Admission: RE | Admit: 2017-01-30 | Discharge: 2017-01-30 | Disposition: A | Discharge status: Home / Self Care | Payer: Medicare HMO | Source: Ambulatory Visit | Attending: Family Medicine | Admitting: Family Medicine

## 2017-01-30 DIAGNOSIS — R140 Abdominal distension (gaseous): Secondary | ICD-10-CM | POA: Diagnosis not present

## 2017-01-30 DIAGNOSIS — R1084 Generalized abdominal pain: Secondary | ICD-10-CM | POA: Diagnosis not present

## 2017-01-30 DIAGNOSIS — R6881 Early satiety: Secondary | ICD-10-CM | POA: Insufficient documentation

## 2017-01-30 DIAGNOSIS — R935 Abnormal findings on diagnostic imaging of other abdominal regions, including retroperitoneum: Secondary | ICD-10-CM | POA: Diagnosis not present

## 2017-01-30 DIAGNOSIS — R1011 Right upper quadrant pain: Secondary | ICD-10-CM | POA: Insufficient documentation

## 2017-01-30 MED ORDER — TECHNETIUM TC 99M MEBROFENIN IV KIT
5.0000 | PACK | Freq: Once | INTRAVENOUS | Status: AC | PRN
  Administered 2017-01-30: 5 via INTRAVENOUS

## 2017-01-31 ENCOUNTER — Telehealth: Payer: Self-pay

## 2017-01-31 NOTE — Telephone Encounter (Signed)
Called patient and gave lab results. Patient had no questions or concerns.  

## 2017-01-31 NOTE — Telephone Encounter (Signed)
-----   Message from [name], NP-C sent at 01/30/2017  8:35 PM EST ----- Her ultrasound result shows that her gallbladder is normal.

## 2017-02-01 ENCOUNTER — Other Ambulatory Visit: Payer: Self-pay | Admitting: Family Medicine

## 2017-02-03 ENCOUNTER — Other Ambulatory Visit: Payer: Self-pay | Admitting: Pharmacist

## 2017-02-04 NOTE — Patient Outreach (Addendum)
[facility]) Care Management  02/04/2017  Jetaime Pinnix Jetter 1946-01-05 [id number]   Patient called and requested a [facility] Calendar for 2019.  A calendar was taken to the patient per her request. Patient's case will be officially closed today as her needs are met.   [name], PharmD, [place] Clinical Pharmacist [phone]

## 2017-02-11 ENCOUNTER — Ambulatory Visit: Payer: Medicare HMO | Admitting: Podiatry

## 2017-02-12 ENCOUNTER — Ambulatory Visit (INDEPENDENT_AMBULATORY_CARE_PROVIDER_SITE_OTHER): Payer: Medicare HMO | Admitting: Podiatry

## 2017-02-12 ENCOUNTER — Encounter: Payer: Self-pay | Admitting: Podiatry

## 2017-02-12 DIAGNOSIS — M79609 Pain in unspecified limb: Principal | ICD-10-CM

## 2017-02-12 DIAGNOSIS — M79676 Pain in unspecified toe(s): Secondary | ICD-10-CM

## 2017-02-12 DIAGNOSIS — B351 Tinea unguium: Secondary | ICD-10-CM

## 2017-02-13 NOTE — Progress Notes (Signed)
Subjective:   Patient ID: Courtney Goodman, female   DOB: 72 y.o.   MRN: [id number]   HPI Patient presents with elongated nailbeds 1-5 both feet that are thick and she cannot cut along with diabetes   ROS      Objective:  Physical Exam  Neurovascular status intact with thick yellow brittle nailbeds that are painful 1-5 both feet     Assessment:  Mycotic nail infection with pain 1-5 both feet     Plan:  Debride painful nailbeds 1-5 both feet with no iatrogenic bleeding noted

## 2017-02-17 DIAGNOSIS — F330 Major depressive disorder, recurrent, mild: Secondary | ICD-10-CM | POA: Diagnosis not present

## 2017-02-20 ENCOUNTER — Ambulatory Visit: Payer: Medicare HMO | Admitting: Family Medicine

## 2017-02-25 ENCOUNTER — Other Ambulatory Visit: Payer: Self-pay | Admitting: Endocrinology

## 2017-02-25 ENCOUNTER — Encounter: Payer: Self-pay | Admitting: Family Medicine

## 2017-02-25 ENCOUNTER — Ambulatory Visit (INDEPENDENT_AMBULATORY_CARE_PROVIDER_SITE_OTHER): Payer: Medicare HMO | Admitting: Family Medicine

## 2017-02-25 VITALS — BP 128/68 | HR 73 | Temp 98.1°F | Wt 185.8 lb

## 2017-02-25 DIAGNOSIS — J209 Acute bronchitis, unspecified: Secondary | ICD-10-CM

## 2017-02-25 DIAGNOSIS — J440 Chronic obstructive pulmonary disease with acute lower respiratory infection: Secondary | ICD-10-CM

## 2017-02-25 MED ORDER — AZITHROMYCIN 500 MG PO TABS: 500.0000 mg | ORAL_TABLET | Freq: Every day | ORAL | 0 refills | Status: DC

## 2017-02-25 NOTE — Progress Notes (Signed)
   Subjective:    Patient ID: Courtney Goodman, female    DOB: [birth year], 72 y.o.   MRN: [id number]  HPI She complains of a week long history that started with cough followed by slight headache, rhinorrhea, nasal congestion that she states is slowly getting worse.  No sore throat, earache, fever, chills or sneezing.  She does have an underlying history of COPD and apparently in the past did use an unidentified inhaler.  She stopped using that.  Review of Systems     Objective:   Physical Exam Alert and in no distress. Tympanic membranes and canals are normal. Pharyngeal area is normal. Neck is supple without adenopathy or thyromegaly. Cardiac exam shows a regular sinus rhythm without murmurs or gallops. Lungs are clear to auscultation.        Assessment & Plan:  Acute bronchitis with COPD ([place]) - Plan: azithromycin (ZITHROMAX) 500 MG tablet  Since her symptoms seem to be getting worse and think it is reasonable to treat her with azithromycin.  Cautioned that it will take at least a week on this medicine before she might see a total improvement.

## 2017-02-27 ENCOUNTER — Other Ambulatory Visit: Payer: Self-pay | Admitting: Endocrinology

## 2017-02-28 ENCOUNTER — Other Ambulatory Visit: Payer: Self-pay

## 2017-03-03 ENCOUNTER — Other Ambulatory Visit: Payer: Self-pay | Admitting: Family Medicine

## 2017-03-07 ENCOUNTER — Ambulatory Visit (INDEPENDENT_AMBULATORY_CARE_PROVIDER_SITE_OTHER): Payer: Medicare HMO | Admitting: Endocrinology

## 2017-03-07 ENCOUNTER — Encounter: Payer: Self-pay | Admitting: Endocrinology

## 2017-03-07 VITALS — BP 116/62 | HR 72 | Wt 182.0 lb

## 2017-03-07 DIAGNOSIS — E1122 Type 2 diabetes mellitus with diabetic chronic kidney disease: Secondary | ICD-10-CM

## 2017-03-07 DIAGNOSIS — N183 Chronic kidney disease, stage 3 unspecified: Secondary | ICD-10-CM

## 2017-03-07 DIAGNOSIS — Z794 Long term (current) use of insulin: Secondary | ICD-10-CM

## 2017-03-07 LAB — POCT GLYCOSYLATED HEMOGLOBIN (HGB A1C): Hemoglobin A1C: 6.3

## 2017-03-07 MED ORDER — INSULIN DETEMIR 100 UNIT/ML FLEXPEN: 170.0000 [IU] | PEN_INJECTOR | SUBCUTANEOUS | 3 refills | Status: DC

## 2017-03-07 NOTE — Progress Notes (Signed)
Subjective:    Patient ID: Courtney Goodman, female    DOB: [birth year], 72 y.o.   MRN: [id number]  HPI Pt returns for f/u of diabetes mellitus: DM type: Insulin-requiring type 2 Dx'ed: 9233 Complications: renal insufficiency and CAD Therapy: insulin since 2009, and victoza. GDM: never DKA: never Severe hypoglycemia: once (2012). Pancreatitis: never.  Other: she is on qd insulin, due to h/o noncompliance.   Interval history: no cbg record, but states cbg's vary from 55-200.  There is no trend throughout the day, but it is lowest when a meal is missed, and highest at HS.  pt states she feels well in general.   Past Medical History:  Diagnosis Date  . Anemia of chronic disease   . Asthma   . Atherosclerotic heart disease of native coronary artery without angina pectoris   . COPD (chronic obstructive pulmonary disease) ([place])   . DM (diabetes mellitus), type 2 with renal complications ([place])   . Dysrhythmia   . Elevated ferritin 01/03/2017  . GAD (generalized anxiety disorder)   . Hypertension   . Mixed hyperlipidemia due to type 2 diabetes mellitus ([place])   . Mixed incontinence    per medical records from [place]  . Osteopenia   . Personal history of noncompliance with medical treatment, presenting hazards to health   . Shortness of breath dyspnea   . Suicidal ideation   . TB (tuberculosis), treated    age 75  . Vitamin D deficiency     Past Surgical History:  Procedure Laterality Date  . ANKLE RECONSTRUCTION     right  . CARPAL TUNNEL RELEASE    . CORONARY ANGIOPLASTY      Social History   Socioeconomic History  . Marital status: Divorced    Spouse name: Not on file  . Number of children: Not on file  . Years of education: Not on file  . Highest education level: Not on file  Social Needs  . Financial resource strain: Not on file  . Food insecurity - worry: Not on file  . Food insecurity - inability: Not on file  . Transportation needs - medical: Not on file  .  Transportation needs - non-medical: Not on file  Occupational History  . Not on file  Tobacco Use  . Smoking status: Former Research scientist (life sciences)  . Smokeless tobacco: Never Used  Substance and Sexual Activity  . Alcohol use: No  . Drug use: No  . Sexual activity: No  Other Topics Concern  . Not on file  Social History Narrative  . Not on file    Current Outpatient Medications on File Prior to Visit  Medication Sig Dispense Refill  . amLODipine-benazepril (LOTREL) 5-20 MG capsule TAKE 1 CAPSULE BY MOUTH EVERY DAY 90 capsule 0  . aspirin EC 81 MG tablet Take 81 mg by mouth every morning.     . Biotin 10000 MCG TABS Take 1 tablet by mouth every morning.    . Blood Glucose Monitoring Suppl (ACCU-CHEK AVIVA PLUS) w/Device KIT Test twice a day. Dx code E11.9 1 kit 0  . carvedilol (COREG) 6.25 MG tablet Take 1 tablet by mouth twice daily. 180 tablet 0  . citalopram (CELEXA) 20 MG tablet Take 1 tablet (20 mg total) by mouth daily. 90 tablet 1  . fenofibrate (TRICOR) 145 MG tablet Take 1 tablet (145 mg total) by mouth daily. 90 tablet 1  . FEROSUL 325 (65 Fe) MG tablet TAKE 1 TABLET BY MOUTH EVERY DAY FOR IRON  DEFICIENCY 90 tablet 0  . fluticasone (FLONASE) 50 MCG/ACT nasal spray Place 2 sprays into both nostrils daily. 48 g 0  . folic acid (FOLVITE) 1 MG tablet Take 1 tablet (1 mg total) by mouth daily. 90 tablet 0  . liraglutide (VICTOZA) 18 MG/3ML SOPN Inject 1.8 mg subcutaneously daily. 9 pen 1  . omeprazole (PRILOSEC) 40 MG capsule Take 1 capsule (40 mg total) by mouth daily. 90 capsule 1  . simvastatin (ZOCOR) 20 MG tablet Take 1 tablet by mouth every night. 90 tablet 0  . traMADol (ULTRAM) 50 MG tablet Take 1 tablet (50 mg total) by mouth daily as needed. 90 tablet 0  . traZODone (DESYREL) 50 MG tablet Take 50 mg by mouth at bedtime. Patient gets this from her therapist, not PCP    . TRUE METRIX BLOOD GLUCOSE TEST test strip USE AS DIRECTED THREE TIMES A DAY 100 each 2  . cholecalciferol (VITAMIN  D) 1000 units tablet Take 1 tablet by mouth daily. (Patient not taking: Reported on 03/07/2017) 90 tablet 0   No current facility-administered medications on file prior to visit.     No Known Allergies  Family History  Problem Relation Age of Onset  . Diabetes Mother     BP 116/62 (BP Location: Left Arm, Patient Position: Sitting, Cuff Size: Normal)   Pulse 72   Wt 182 lb (82.6 kg)   SpO2 98%   BMI 31.24 kg/m    Review of Systems Denies LOC.     Objective:   Physical Exam VITAL SIGNS:  See vs page GENERAL: no distress Pulses: foot pulses are intact bilaterally.   MSK: no deformity of the feet or ankles.  CV: no edema of the legs or ankles Skin:  no ulcer on the feet or ankles.  normal color and temp on the feet and ankles.  Old healed surgical scars on the right ankle and foot.  There is a 2 cm heavy callus at the plantar aspect of the left foot Neuro: sensation is intact to touch on the feet and ankles.   Ext: There is bilateral onychomycosis of the toenails, and some are ingrown.   A1c=6.3%      Assessment & Plan:  Insulin-requiring type 2 DM: overcontrolled, given this regimen, which does match insulin to her changing needs throughout the day Hypoglycemia: this limits aggressiveness of glycemic control Foot callus, new   Patient Instructions  check your blood sugar twice a day.  vary the time of day when you check, between before the 3 meals, and at bedtime.  also check if you have symptoms of your blood sugar being too high or too low.  please keep a record of the readings and bring it to your next appointment here (or you can bring the meter itself).  You can write it on any piece of paper.  please call us sooner if your blood sugar goes below 70, or if you have a lot of readings over 200.   please reduce the levemir to 170 units each morning.   On this type of insulin schedule, you should eat meals on a regular schedule.  If a meal is missed or significantly  delayed, your blood sugar could go low.   check in each of you left shoes, to see if something is rubbing against your foot.   Please come back for a follow-up appointment in 4 months.

## 2017-03-07 NOTE — Patient Instructions (Addendum)
check your blood sugar twice a day.  vary the time of day when you check, between before the 3 meals, and at bedtime.  also check if you have symptoms of your blood sugar being too high or too low.  please keep a record of the readings and bring it to your next appointment here (or you can bring the meter itself).  You can write it on any piece of paper.  please call us sooner if your blood sugar goes below 70, or if you have a lot of readings over 200.   please reduce the levemir to 170 units each morning.   On this type of insulin schedule, you should eat meals on a regular schedule.  If a meal is missed or significantly delayed, your blood sugar could go low.   check in each of you left shoes, to see if something is rubbing against your foot.   Please come back for a follow-up appointment in 4 months.

## 2017-04-02 ENCOUNTER — Other Ambulatory Visit: Payer: Self-pay | Admitting: Family Medicine

## 2017-04-02 NOTE — Telephone Encounter (Signed)
Pt has an appt on 4/10. I will refill med until then

## 2017-04-03 ENCOUNTER — Other Ambulatory Visit: Payer: Self-pay | Admitting: Family Medicine

## 2017-04-03 NOTE — Telephone Encounter (Signed)
Pt has an appt in April. Is this okay to refill?

## 2017-04-03 NOTE — Telephone Encounter (Signed)
Can you send this in please?

## 2017-04-03 NOTE — Telephone Encounter (Signed)
Ok until her appt

## 2017-04-14 ENCOUNTER — Ambulatory Visit (INDEPENDENT_AMBULATORY_CARE_PROVIDER_SITE_OTHER): Payer: Medicare HMO | Admitting: Family Medicine

## 2017-04-14 ENCOUNTER — Encounter: Payer: Self-pay | Admitting: Family Medicine

## 2017-04-14 VITALS — BP 140/90 | HR 76 | Temp 98.7°F | Resp 16 | Wt 181.8 lb

## 2017-04-14 DIAGNOSIS — J209 Acute bronchitis, unspecified: Secondary | ICD-10-CM

## 2017-04-14 DIAGNOSIS — J449 Chronic obstructive pulmonary disease, unspecified: Secondary | ICD-10-CM | POA: Diagnosis not present

## 2017-04-14 DIAGNOSIS — G4752 REM sleep behavior disorder: Secondary | ICD-10-CM | POA: Diagnosis not present

## 2017-04-14 MED ORDER — AMOXICILLIN-POT CLAVULANATE 875-125 MG PO TABS: 1.0000 | ORAL_TABLET | Freq: Two times a day (BID) | ORAL | 0 refills | Status: DC

## 2017-04-14 MED ORDER — ALBUTEROL SULFATE HFA 108 (90 BASE) MCG/ACT IN AERS: 2.0000 | INHALATION_SPRAY | Freq: Four times a day (QID) | RESPIRATORY_TRACT | 0 refills | Status: DC | PRN

## 2017-04-14 NOTE — Progress Notes (Signed)
Chief Complaint  Patient presents with  . bad cough    bad cough- been coughing for over a week. seen Dr. Redmond School back in January and got better then got worse again.     Subjective:  Courtney Goodman is a 72 y.o. female who presents for a 2 week history of cough that is mainly dry and at night. States she has been wheezing and having productive cough at night. Shoe does not think she is improving. History of underlying COPD. She is not currently using an inhaler and cannot recall which one she has used in the past. She is requesting albuterol inhaler today.  Denies underlying allergies and no allergy symptoms per patient.  History of GERD and is taking PPI daily. Denies reflux symptoms.   She had bronchitis in January and took a 3 day course of Azithromycin. States she got completely better and this is a new problem.   Denies fever, chills, rhinorrhea, nasal congestion, ear pain, sore throat, chest pain, shortness of breath, abdominal pain, N/V/D.   States she has sleep apnea and needs help with her CPAP machine. States she used to be on oxygen at night.  Sleep test showed that she did not have OSA.   She was diagnosed with REM sleep behavior disorder. She is seeing her psychiatrist about this.   Treatment to date: none.  Denies sick contacts.  No other aggravating or relieving factors.  No other c/o.  ROS as in subjective.   Objective: Vitals:   04/14/17 1113  BP: 140/90  Pulse: 76  Resp: 16  Temp: 98.7 F (37.1 C)  SpO2: 99%    General appearance: Alert, WD/WN, no distress, well appearing                             Skin: warm, no rash                           Head: no sinus tenderness                            Eyes: conjunctiva normal, corneas clear, PERRLA                            Ears: pearly TMs, external ear canals normal                          Nose: septum midline, turbinates swollen, with erythema and no discharge             Mouth/throat: MMM, tongue normal,  mild pharyngeal erythema                           Neck: supple, no adenopathy, no thyromegaly, nontender                          Heart: RRR, normal S1, S2, no murmurs                         Lungs: CTA bilaterally, no wheezes, rales, or rhonchi      Assessment: Acute bronchitis, unspecified organism - Plan: DG Chest 2 View, albuterol (PROVENTIL HFA;VENTOLIN HFA) 108 (90 Base) MCG/ACT inhaler, amoxicillin-clavulanate (AUGMENTIN) 875-125  MG tablet  Chronic obstructive pulmonary disease, unspecified COPD type ([place]) - Plan: DG Chest 2 View, albuterol (PROVENTIL HFA;VENTOLIN HFA) 108 (90 Base) MCG/ACT inhaler  REM sleep behavior disorder    Plan: Exam is unremarkable except for congested cough. Vitals WNL. No respiratory distress.  Discussed diagnosis and treatment of bronchitis. This is the second diagnosis of bronchitis this year so fare. Discussed that we will treat her with Augmentin and I will prescribe albuterol for her as well. Chest XR ordered. She will try Mucinex. Plan to bring her back in 2-3 weeks (at her AWV) and follow up on symptoms.  Discussed sending her back to pulmonologist for further evaluation of COPD if she has another flare up.       We also discussed that per her most recent sleep study, she does not have OSA. She does have REM sleep disorder and is planning to talk to her psychiatrist about this.

## 2017-04-14 NOTE — Patient Instructions (Signed)
Take the antibiotic as prescribed.  You can try over the counter Mucinex (guafenisin) for chest congestion and cough.   Use the albuterol as needed.   I will see you back in April and call you with your chest XR results.

## 2017-04-24 ENCOUNTER — Ambulatory Visit
Admission: RE | Admit: 2017-04-24 | Discharge: 2017-04-24 | Disposition: A | Discharge status: Home / Self Care | Payer: Medicare HMO | Source: Ambulatory Visit | Attending: Family Medicine | Admitting: Family Medicine

## 2017-04-24 DIAGNOSIS — J449 Chronic obstructive pulmonary disease, unspecified: Secondary | ICD-10-CM

## 2017-04-24 DIAGNOSIS — J209 Acute bronchitis, unspecified: Secondary | ICD-10-CM

## 2017-05-04 ENCOUNTER — Other Ambulatory Visit: Payer: Self-pay | Admitting: Family Medicine

## 2017-05-06 ENCOUNTER — Other Ambulatory Visit: Payer: Self-pay | Admitting: Endocrinology

## 2017-05-06 NOTE — Progress Notes (Signed)
Courtney Goodman is a 72 y.o. female who presents for annual wellness visit and follow-up on chronic medical conditions.  She has the following concerns:  Complains of vaginal dryness. States she tried to have sex but this was uncomfortable. Has not tried anything for this issue. Denies vaginal discharge, odor, vaginal irritation.    Immunization History  Administered Date(s) Administered  . Influenza, High Dose Seasonal PF 11/23/2015, 01/03/2017  . Pneumococcal Conjugate-13 04/18/2016  . Pneumococcal Polysaccharide-23 05/07/2017   Last Pap smear: hysterectomy Last mammogram: 2017 Last colonoscopy: 2008. Requests Cologuard instead of colonoscopy. Denies history of abnormal colonoscopy.  Last DEXA:  2016- osteopenia  Dentist: years ago. Refuses to go. States she has a Therapist, occupational.  Ophtho: [place] at eye clinic- around a year ago. States she has retinopathy.  Exercise: walks a lot  History of elevated ferritin and anemia of chronic disease. She is no longer taking an iron supplement.   Diabetes is under better control and being managed by Dr. Loanne Drilling.  CKD managed by Dr. Florene Glen.   Other doctors caring for patient include: Dr. Loanne Drilling - endocrinologist  Dr. Paulla Dolly- podiatrist  Psychiatrist- Lattie Haw at [facility] every 3 months  Dr. Florene Glen- nephrologist on [place] - 05/07/17 1032    Normal clock drawing test?  yes    How many words correct?  2        Depression screen:  See questionnaire below.  Depression screen [facility] 2/9 05/07/2017 01/01/2017 09/19/2016 07/02/2016 07/01/2016  Decreased Interest 0 0 1 1 1   Down, Depressed, Hopeless 0 0 0 1 1  PHQ - 2 Score 0 0 1 2 2   Altered sleeping - - - 0 0  Tired, decreased energy - - - 1 1  Change in appetite - - - 1 1  Feeling bad or failure about yourself  - - - 0 0  Trouble concentrating - - - 0 0  Moving slowly or fidgety/restless - - - 0 0  Suicidal thoughts - - - 0 -  PHQ-9 Score - - - 4 4  Difficult doing work/chores - - - Not  difficult at all Not difficult at all    Fall Risk Screen: see questionnaire below. Fall Risk  05/07/2017 09/19/2016 07/05/2016 07/01/2016 04/18/2016  Falls in the past year? No No Yes Yes No  Number falls in past yr: - - 2 or more 2 or more -  Injury with Fall? - - No No -  Risk Factor Category  - - High Fall Risk High Fall Risk -  Risk for fall due to : - - History of fall(s);Impaired balance/gait;Impaired mobility;Impaired vision;Medication side effect History of fall(s);Impaired balance/gait;Impaired mobility;Medication side effect;Mental status change -  Follow up - - Falls prevention discussed;Follow up appointment Falls evaluation completed;Education provided -    ADL screen:  See questionnaire below Functional Status Survey: Is the patient deaf or have difficulty hearing?: No Does the patient have difficulty seeing, even when wearing glasses/contacts?: No Does the patient have difficulty concentrating, remembering, or making decisions?: No Does the patient have difficulty walking or climbing stairs?: No Does the patient have difficulty dressing or bathing?: No Does the patient have difficulty doing errands alone such as visiting a doctor's office or shopping?: No   End of Life Discussion:  Patient does not have a living will and medical power of attorney. Forms discussed and sent home with patient. MOST form filled out with patient and will be scanned into her chart.  Review of Systems Constitutional: -fever, -chills, -sweats, -unexpected weight change, -anorexia, -fatigue Allergy: -sneezing, -itching, -congestion Dermatology: denies changing moles, rash, lumps, new worrisome lesions ENT: -runny nose, -ear pain, -sore throat, -hoarseness, -sinus pain, -teeth pain, -tinnitus, -hearing loss Cardiology:  -chest pain, -palpitations, -edema, -orthopnea, -paroxysmal nocturnal dyspnea Respiratory: -cough, -shortness of breath, -dyspnea on exertion, -wheezing, -hemoptysis Gastroenterology:  -abdominal pain, -nausea, -vomiting, -diarrhea, -constipation, -blood in stool, -changes in bowel movement, -dysphagia Hematology: -bleeding or bruising problems Musculoskeletal: -arthralgias, -myalgias, -joint swelling, -back pain, -neck pain, -cramping, -gait changes Ophthalmology: -vision changes, -eye redness, -itching, -discharge Urology: -dysuria, -difficulty urinating, -hematuria, -urinary frequency, -urgency, incontinence Neurology: -headache, -weakness, -tingling, -numbness, -speech abnormality, -memory loss, -falls, -dizziness Psychology:  -depressed mood, -agitation, -sleep problems    PHYSICAL EXAM:  BP 120/60   Pulse 72   Ht 5\' 4"  (1.626 m)   Wt 180 lb 12.8 oz (82 kg)   BMI 31.03 kg/m   General Appearance: Alert, cooperative, no distress, appears stated age Head: Normocephalic, without obvious abnormality, atraumatic Eyes: PERRL, conjunctiva/corneas clear, EOM's intact, fundi benign Ears: Normal TM's and external ear canals Nose: Nares normal, mucosa normal, no drainage or sinus tenderness Throat: Lips, mucosa, and tongue normal; teeth and gums normal Neck: Supple, no lymphadenopathy; thyroid: no enlargement/tenderness/nodules; no carotid bruit or JVD Back: Spine nontender, no curvature, ROM normal, no CVA tenderness Lungs: Clear to auscultation bilaterally without wheezes, rales or ronchi; respirations unlabored Chest Wall: No tenderness or deformity Heart: Regular rate and rhythm, S1 and S2 normal, no murmur, rub or gallop Breast Exam: No tenderness, masses, or nipple discharge or inversion. No axillary lymphadenopathy Abdomen: Soft, non-tender, nondistended, normoactive bowel sounds, no masses, no hepatosplenomegaly Genitalia: refuses  Extremities: No clubbing, cyanosis or edema Pulses: 2+ and symmetric all extremities Skin: Skin color, texture, turgor normal, no rashes or lesions Lymph nodes: Cervical, supraclavicular, and axillary nodes normal Neurologic:  CNII-XII intact, normal strength, sensation and gait; reflexes 2+ and symmetric throughout Psych: Normal mood, affect, hygiene and grooming.  ASSESSMENT/PLAN: Medicare annual wellness visit, subsequent  Vaccine counseling - Plan: Pneumococcal polysaccharide vaccine 23-valent greater than or equal to 2yo subcutaneous/IM  Need for vaccination against Streptococcus pneumoniae  Vaginal dryness  Osteopenia, unspecified location - Plan: DG Bone Density  Estrogen deficiency - Plan: DG Bone Density  Screening for breast cancer - Plan: MM DIGITAL SCREENING BILATERAL  Elevated ferritin - Plan: Iron, TIBC and Ferritin Panel  Mixed hyperlipidemia due to type 2 diabetes mellitus ([place]) - Plan: Lipid panel  Essential hypertension - Plan: CBC with Differential/Platelet, Comprehensive metabolic panel  Chronic kidney disease, unspecified CKD stage  GAD (generalized anxiety disorder)  Screen for colon cancer - Plan: Cologuard  Advance directive discussed with patient  Anemia of chronic disease - Plan: CBC with Differential/Platelet, Iron, TIBC and Ferritin Panel  Type 2 diabetes mellitus with stage 3 chronic kidney disease, with long-term current use of insulin ([facility]) - Plan: CBC with Differential/Platelet, Comprehensive metabolic panel, TSH, Lipid panel  She appears to be doing well. No sign of depression or anxiety. No falls.  Discussed trying olive oil or an OTC lubricant for vaginal dryness and if this does not help she may return for a pelvic exam since she refuses this today.  She will call and schedule mammogram and DEXA.  Continue seeing specialists for diabetes, CKD.  BP is controlled. Continue on current medications.  Continue on statin.  Recheck CBC and iron studies.  Normal colonoscopy in 2008. Order cologuard to screen for colon  cancer in average risk patient.  Vaccine counseling.  Pneumovax given.  She will call her insurance company and check on Shingrix vaccine.  She will  return if she needs assistance with advance directives.  Follow up pending labs.   Discussed monthly self breast exams and yearly mammograms; at least 30 minutes of aerobic activity at least 5 days/week and weight-bearing exercise 2x/week; proper sunscreen use reviewed; healthy diet, including goals of calcium and vitamin D intake and alcohol recommendations (less than or equal to 1 drink/day) reviewed; regular seatbelt use; changing batteries in smoke detectors.  Immunization recommendations discussed.  Colonoscopy recommendations reviewed   Medicare Attestation I have personally reviewed: The patient's medical and social history Their use of alcohol, tobacco or illicit drugs Their current medications and supplements The patient's functional ability including ADLs,fall risks, home safety risks, cognitive, and hearing and visual impairment Diet and physical activities Evidence for depression or mood disorders  The patient's weight, height, and BMI have been recorded in the chart.  I have made referrals, counseling, and provided education to the patient based on review of the above and I have provided the patient with a written personalized care plan for preventive services.     [name], NP-C   05/07/2017

## 2017-05-07 ENCOUNTER — Encounter: Payer: Self-pay | Admitting: Family Medicine

## 2017-05-07 ENCOUNTER — Ambulatory Visit (INDEPENDENT_AMBULATORY_CARE_PROVIDER_SITE_OTHER): Payer: Medicare HMO | Admitting: Family Medicine

## 2017-05-07 VITALS — BP 120/60 | HR 72 | Ht 64.0 in | Wt 180.8 lb

## 2017-05-07 DIAGNOSIS — E1122 Type 2 diabetes mellitus with diabetic chronic kidney disease: Secondary | ICD-10-CM

## 2017-05-07 DIAGNOSIS — Z1211 Encounter for screening for malignant neoplasm of colon: Secondary | ICD-10-CM | POA: Diagnosis not present

## 2017-05-07 DIAGNOSIS — M8580 Other specified disorders of bone density and structure, unspecified site: Secondary | ICD-10-CM

## 2017-05-07 DIAGNOSIS — Z23 Encounter for immunization: Secondary | ICD-10-CM | POA: Diagnosis not present

## 2017-05-07 DIAGNOSIS — Z1239 Encounter for other screening for malignant neoplasm of breast: Secondary | ICD-10-CM

## 2017-05-07 DIAGNOSIS — N898 Other specified noninflammatory disorders of vagina: Secondary | ICD-10-CM | POA: Diagnosis not present

## 2017-05-07 DIAGNOSIS — N183 Chronic kidney disease, stage 3 unspecified: Secondary | ICD-10-CM

## 2017-05-07 DIAGNOSIS — E1169 Type 2 diabetes mellitus with other specified complication: Secondary | ICD-10-CM

## 2017-05-07 DIAGNOSIS — I10 Essential (primary) hypertension: Secondary | ICD-10-CM

## 2017-05-07 DIAGNOSIS — D638 Anemia in other chronic diseases classified elsewhere: Secondary | ICD-10-CM

## 2017-05-07 DIAGNOSIS — E782 Mixed hyperlipidemia: Secondary | ICD-10-CM

## 2017-05-07 DIAGNOSIS — Z1231 Encounter for screening mammogram for malignant neoplasm of breast: Secondary | ICD-10-CM | POA: Diagnosis not present

## 2017-05-07 DIAGNOSIS — E2839 Other primary ovarian failure: Secondary | ICD-10-CM

## 2017-05-07 DIAGNOSIS — N189 Chronic kidney disease, unspecified: Secondary | ICD-10-CM

## 2017-05-07 DIAGNOSIS — R7989 Other specified abnormal findings of blood chemistry: Secondary | ICD-10-CM

## 2017-05-07 DIAGNOSIS — Z794 Long term (current) use of insulin: Secondary | ICD-10-CM

## 2017-05-07 DIAGNOSIS — Z7189 Other specified counseling: Secondary | ICD-10-CM

## 2017-05-07 DIAGNOSIS — Z0000 Encounter for general adult medical examination without abnormal findings: Secondary | ICD-10-CM

## 2017-05-07 DIAGNOSIS — Z7185 Encounter for immunization safety counseling: Secondary | ICD-10-CM

## 2017-05-07 DIAGNOSIS — F411 Generalized anxiety disorder: Secondary | ICD-10-CM

## 2017-05-07 NOTE — Patient Instructions (Signed)
Call and schedule your mammogram and bone density test at the breast center.  Call your insurance company and find out your co-pay for shingles vaccine, Shingrix. If you decide to get this you will need to go to your pharmacy and take the prescription with you.  You will receive a kit in the mail called Cologuard to screen for colon cancer.  Follow the instructions and mail this in as directed.  As discussed, sit down with your daughter and fill out the advance directives.  Return these at your convenience or come in for a visit if you have questions.  We will call you with your lab results.

## 2017-05-08 LAB — COMPREHENSIVE METABOLIC PANEL
ALT: 11 IU/L (ref 0–32)
AST: 23 IU/L (ref 0–40)
Albumin/Globulin Ratio: 1.1 — ABNORMAL LOW (ref 1.2–2.2)
Albumin: 4 g/dL (ref 3.5–4.8)
Alkaline Phosphatase: 58 IU/L (ref 39–117)
BUN/Creatinine Ratio: 18 (ref 12–28)
BUN: 28 mg/dL — ABNORMAL HIGH (ref 8–27)
Bilirubin Total: 0.3 mg/dL (ref 0.0–1.2)
CO2: 27 mmol/L (ref 20–29)
Calcium: 9.8 mg/dL (ref 8.7–10.3)
Chloride: 104 mmol/L (ref 96–106)
Creatinine, Ser: 1.56 mg/dL — ABNORMAL HIGH (ref 0.57–1.00)
GFR calc Af Amer: 38 mL/min/{1.73_m2} — ABNORMAL LOW (ref >59)
GFR calc non Af Amer: 33 mL/min/{1.73_m2} — ABNORMAL LOW (ref >59)
Globulin, Total: 3.8 g/dL (ref 1.5–4.5)
Glucose: 126 mg/dL — ABNORMAL HIGH (ref 65–99)
Potassium: 4.6 mmol/L (ref 3.5–5.2)
Sodium: 146 mmol/L — ABNORMAL HIGH (ref 134–144)
Total Protein: 7.8 g/dL (ref 6.0–8.5)

## 2017-05-08 LAB — CBC WITH DIFFERENTIAL/PLATELET
Basophils Absolute: 0 10*3/uL (ref 0.0–0.2)
Basos: 0 %
EOS (ABSOLUTE): 0.4 10*3/uL (ref 0.0–0.4)
Eos: 4 %
Hematocrit: 32.4 % — ABNORMAL LOW (ref 34.0–46.6)
Hemoglobin: 10.3 g/dL — ABNORMAL LOW (ref 11.1–15.9)
Immature Grans (Abs): 0 10*3/uL (ref 0.0–0.1)
Immature Granulocytes: 0 %
Lymphocytes Absolute: 1.9 10*3/uL (ref 0.7–3.1)
Lymphs: 21 %
MCH: 29.1 pg (ref 26.6–33.0)
MCHC: 31.8 g/dL (ref 31.5–35.7)
MCV: 92 fL (ref 79–97)
Monocytes Absolute: 0.5 10*3/uL (ref 0.1–0.9)
Monocytes: 5 %
Neutrophils Absolute: 6.5 10*3/uL (ref 1.4–7.0)
Neutrophils: 70 %
Platelets: 377 10*3/uL (ref 150–379)
RBC: 3.54 x10E6/uL — ABNORMAL LOW (ref 3.77–5.28)
RDW: 15.5 % — ABNORMAL HIGH (ref 12.3–15.4)
WBC: 9.2 10*3/uL (ref 3.4–10.8)

## 2017-05-08 LAB — IRON,TIBC AND FERRITIN PANEL
Ferritin: 426 ng/mL — ABNORMAL HIGH (ref 15–150)
Iron Saturation: 26 % (ref 15–55)
Iron: 72 ug/dL (ref 27–139)
Total Iron Binding Capacity: 279 ug/dL (ref 250–450)
UIBC: 207 ug/dL (ref 118–369)

## 2017-05-08 LAB — LIPID PANEL
Chol/HDL Ratio: 3.6 ratio (ref 0.0–4.4)
Cholesterol, Total: 123 mg/dL (ref 100–199)
HDL: 34 mg/dL — ABNORMAL LOW (ref >39)
LDL Calculated: 73 mg/dL (ref 0–99)
Triglycerides: 81 mg/dL (ref 0–149)
VLDL Cholesterol Cal: 16 mg/dL (ref 5–40)

## 2017-05-08 LAB — TSH: TSH: 3.65 u[IU]/mL (ref 0.450–4.500)

## 2017-05-09 ENCOUNTER — Other Ambulatory Visit: Payer: Self-pay | Admitting: Family Medicine

## 2017-05-14 ENCOUNTER — Other Ambulatory Visit: Payer: Medicare HMO

## 2017-06-01 ENCOUNTER — Other Ambulatory Visit: Payer: Self-pay | Admitting: Family Medicine

## 2017-06-02 NOTE — Telephone Encounter (Signed)
Pt was advised to come in with the next couple weeks to discuss iron levels. Is this okay to refill in the meantime

## 2017-06-04 DIAGNOSIS — Z1211 Encounter for screening for malignant neoplasm of colon: Secondary | ICD-10-CM | POA: Diagnosis not present

## 2017-06-04 LAB — COLOGUARD: Cologuard: NEGATIVE

## 2017-06-05 ENCOUNTER — Telehealth: Payer: Self-pay | Admitting: Family Medicine

## 2017-06-05 NOTE — Telephone Encounter (Signed)
Received requested records from [facility] center. Sending back for review.

## 2017-06-10 ENCOUNTER — Encounter: Payer: Self-pay | Admitting: Internal Medicine

## 2017-06-12 ENCOUNTER — Encounter: Payer: Self-pay | Admitting: Family Medicine

## 2017-06-12 ENCOUNTER — Ambulatory Visit (INDEPENDENT_AMBULATORY_CARE_PROVIDER_SITE_OTHER): Payer: Medicare HMO | Admitting: Family Medicine

## 2017-06-12 VITALS — BP 110/70 | HR 66 | Ht 64.25 in | Wt 181.2 lb

## 2017-06-12 DIAGNOSIS — I10 Essential (primary) hypertension: Secondary | ICD-10-CM

## 2017-06-12 DIAGNOSIS — R7989 Other specified abnormal findings of blood chemistry: Secondary | ICD-10-CM

## 2017-06-12 DIAGNOSIS — D649 Anemia, unspecified: Secondary | ICD-10-CM

## 2017-06-12 NOTE — Progress Notes (Signed)
   Subjective:    Patient ID: Courtney Goodman, female    DOB: 1946-01-28, 72 y.o.   MRN: [id number]  HPI Chief Complaint  Patient presents with  . discuss labs    discuss iron level and recheck labs   She is here to follow on elevated ferritin and normocytic anemia with a normal serum iron and TIBC.  Liver US negative.   HTN- reports doing well on all medications and no side effects.   Under the care of Dr. Loanne Drilling for diabetes.  States she has a bone density and mammogram scheduled for next month.   Denies fever, chills, dizziness, chest pain, shortness of breath, abdominal pain, N/V/D.   She would like for me to fill out a handicap placard for her due to inability to walk long distances. States her previous PCP filled this out for her.    Review of Systems Pertinent positives and negatives in the history of present illness.     Objective:   Physical Exam BP 110/70   Pulse 66   Ht 5' 4.25" (1.632 m)   Wt 181 lb 3.2 oz (82.2 kg)   BMI 30.86 kg/m   Alert and in no distress.  Cardiac exam shows a regular sinus rhythm without murmurs or gallops. Lungs are clear to auscultation.       Assessment & Plan:  Anemia, unspecified type - Plan: Ambulatory referral to Hematology  Elevated ferritin - Plan: Ambulatory referral to Hematology, CANCELED: HFE-Associated Hereditary Hemochromatosis (COHESION)  Essential hypertension  HTN- BP is in goal range and she is doing well on current medication regimen.  Referral to hematology for anemia. Suspect anemia of chronic disease. Persistently elevated serum ferritin with a normal serum iron. Appreciate input from hematologist for this.  Handicap placard form filled out per patient request due to difficulty walking long distances related to COPD and chronic knee pain.

## 2017-06-13 ENCOUNTER — Telehealth: Payer: Self-pay | Admitting: Oncology

## 2017-06-13 ENCOUNTER — Encounter: Payer: Self-pay | Admitting: Oncology

## 2017-06-13 LAB — FECAL OCCULT BLOOD, GUAIAC: Fecal Occult Blood: NEGATIVE

## 2017-06-13 NOTE — Telephone Encounter (Signed)
Referral received from [name], NP for a dx of elevated ferritin and anemia. Appt has been scheduled for the pt to see Kristin on 5/29 at 1pm. Pt aware to arrive 30 minutes early. Letter mailed.

## 2017-06-20 ENCOUNTER — Encounter: Payer: Self-pay | Admitting: Family Medicine

## 2017-06-25 ENCOUNTER — Encounter: Payer: Self-pay | Admitting: Oncology

## 2017-06-25 ENCOUNTER — Inpatient Hospital Stay: Payer: Medicare HMO | Attending: Oncology | Admitting: Oncology

## 2017-06-25 ENCOUNTER — Inpatient Hospital Stay: Payer: Medicare HMO

## 2017-06-25 ENCOUNTER — Telehealth: Payer: Self-pay | Admitting: Oncology

## 2017-06-25 VITALS — BP 129/70 | HR 82 | Temp 98.5°F | Resp 18 | Ht 64.25 in | Wt 181.0 lb

## 2017-06-25 DIAGNOSIS — I2510 Atherosclerotic heart disease of native coronary artery without angina pectoris: Secondary | ICD-10-CM | POA: Diagnosis not present

## 2017-06-25 DIAGNOSIS — N189 Chronic kidney disease, unspecified: Secondary | ICD-10-CM | POA: Diagnosis not present

## 2017-06-25 DIAGNOSIS — E785 Hyperlipidemia, unspecified: Secondary | ICD-10-CM | POA: Diagnosis not present

## 2017-06-25 DIAGNOSIS — I129 Hypertensive chronic kidney disease with stage 1 through stage 4 chronic kidney disease, or unspecified chronic kidney disease: Secondary | ICD-10-CM

## 2017-06-25 DIAGNOSIS — Z794 Long term (current) use of insulin: Secondary | ICD-10-CM | POA: Insufficient documentation

## 2017-06-25 DIAGNOSIS — E119 Type 2 diabetes mellitus without complications: Secondary | ICD-10-CM | POA: Diagnosis not present

## 2017-06-25 DIAGNOSIS — D508 Other iron deficiency anemias: Secondary | ICD-10-CM

## 2017-06-25 DIAGNOSIS — K219 Gastro-esophageal reflux disease without esophagitis: Secondary | ICD-10-CM | POA: Diagnosis not present

## 2017-06-25 DIAGNOSIS — M8580 Other specified disorders of bone density and structure, unspecified site: Secondary | ICD-10-CM | POA: Diagnosis not present

## 2017-06-25 DIAGNOSIS — F329 Major depressive disorder, single episode, unspecified: Secondary | ICD-10-CM | POA: Diagnosis not present

## 2017-06-25 DIAGNOSIS — D509 Iron deficiency anemia, unspecified: Secondary | ICD-10-CM | POA: Insufficient documentation

## 2017-06-25 DIAGNOSIS — Z79899 Other long term (current) drug therapy: Secondary | ICD-10-CM

## 2017-06-25 DIAGNOSIS — J449 Chronic obstructive pulmonary disease, unspecified: Secondary | ICD-10-CM | POA: Diagnosis not present

## 2017-06-25 DIAGNOSIS — E559 Vitamin D deficiency, unspecified: Secondary | ICD-10-CM | POA: Diagnosis not present

## 2017-06-25 DIAGNOSIS — Z87891 Personal history of nicotine dependence: Secondary | ICD-10-CM | POA: Insufficient documentation

## 2017-06-25 DIAGNOSIS — R110 Nausea: Secondary | ICD-10-CM

## 2017-06-25 LAB — FOLATE: Folate: 47.4 ng/mL (ref >5.9)

## 2017-06-25 LAB — CMP (CANCER CENTER ONLY)
ALT: 9 U/L (ref 0–55)
AST: 17 U/L (ref 5–34)
Albumin: 3.6 g/dL (ref 3.5–5.0)
Alkaline Phosphatase: 50 U/L (ref 40–150)
Anion gap: 10 (ref 3–11)
BUN: 37 mg/dL — ABNORMAL HIGH (ref 7–26)
CO2: 25 mmol/L (ref 22–29)
Calcium: 9.9 mg/dL (ref 8.4–10.4)
Chloride: 105 mmol/L (ref 98–109)
Creatinine: 1.84 mg/dL — ABNORMAL HIGH (ref 0.60–1.10)
GFR, Est AFR Am: 30 mL/min — ABNORMAL LOW (ref >60)
GFR, Estimated: 26 mL/min — ABNORMAL LOW (ref >60)
Glucose, Bld: 103 mg/dL (ref 70–140)
Potassium: 3.8 mmol/L (ref 3.5–5.1)
Sodium: 140 mmol/L (ref 136–145)
Total Bilirubin: 0.2 mg/dL (ref 0.2–1.2)
Total Protein: 8 g/dL (ref 6.4–8.3)

## 2017-06-25 LAB — CBC WITH DIFFERENTIAL (CANCER CENTER ONLY)
Basophils Absolute: 0 10*3/uL (ref 0.0–0.1)
Basophils Relative: 0 %
Eosinophils Absolute: 0.1 10*3/uL (ref 0.0–0.5)
Eosinophils Relative: 1 %
HCT: 30.5 % — ABNORMAL LOW (ref 34.8–46.6)
Hemoglobin: 9.9 g/dL — ABNORMAL LOW (ref 11.6–15.9)
Lymphocytes Relative: 20 %
Lymphs Abs: 1.9 10*3/uL (ref 0.9–3.3)
MCH: 29.6 pg (ref 25.1–34.0)
MCHC: 32.5 g/dL (ref 31.5–36.0)
MCV: 91 fL (ref 79.5–101.0)
Monocytes Absolute: 0.7 10*3/uL (ref 0.1–0.9)
Monocytes Relative: 7 %
Neutro Abs: 7.1 10*3/uL — ABNORMAL HIGH (ref 1.5–6.5)
Neutrophils Relative %: 72 %
Platelet Count: 346 10*3/uL (ref 145–400)
RBC: 3.35 MIL/uL — ABNORMAL LOW (ref 3.70–5.45)
RDW: 15 % — ABNORMAL HIGH (ref 11.2–14.5)
WBC Count: 9.9 10*3/uL (ref 3.9–10.3)

## 2017-06-25 LAB — VITAMIN B12: Vitamin B-12: 254 pg/mL (ref 180–914)

## 2017-06-25 NOTE — Telephone Encounter (Signed)
Scheduled appt per 5/29 los - unable to schedule bone marrow or f/u - [place] still working with Ria Comment to set up - will call pt when appts scheduled.

## 2017-06-25 NOTE — Progress Notes (Signed)
[facility] Initial Visit:  Patient Care Team: [name], NP-C as PCP - General (Family Medicine)  REFERRING PHYSICIAN: [name], NP  REASON FOR CONSULTATION: Normocytic, normochromic anemia.  HPI: Courtney Goodman is a 72 y.o. female with a past medical history significant for hypertension, hyperlipidemia, COPD, diabetes, depression, history of cardiac stent, GERD, osteoarthritis, and chronic kidney disease.  She has been followed by her primary care provider and had a recent visit on 05/07/2017 with a CBC and iron studies.  Hemoglobin was 10.3, hematocrit 32.4% with an elevated ferritin at 426.  The remaining iron studies were normal and her white count and platelet count were normal.  MCV and MCHC were normal.  Reviewing her previous records, she has been anemic for at least the past 2 to 3 years with her hemoglobin in the 10-11 range.  The patient reports fatigue and feeling cold all the time.  She has periods of being lightheaded and dyspnea on exertion.  The patient reports nausea but no vomiting.  Denies fevers and chills.  Denies chest pain, cough, hemoptysis.  Denies epistaxis and hematuria.  She has not seen any blood in her stool.  She had a recent Cologuard which was negative.  She had a colonoscopy many years ago which she reports is normal. Family history is significant for her mother having diabetes and was on dialysis her father had an issue with his brain which she thinks may have been an aneurysm, and a brother with hypertension. The patient is divorced and has 2 adult daughters.  She is retired.  Reports history of tobacco use of 2 to 3 cigarettes/day for approximately 30 years.  She quit over 20 years ago.  Denies alcohol and drug abuse.  Patient is accompanied to her visit by her daughter, [name].  Review of Systems  Constitutional: Positive for fatigue. Negative for appetite change, fever and unexpected weight change.  HENT:   Negative for hearing  loss, mouth sores, nosebleeds, sore throat and trouble swallowing.   Eyes: Positive for eye problems. Negative for icterus.       Reports blurred vision.  Respiratory: Negative for chest tightness, cough, hemoptysis and wheezing.        Reports dyspnea with exertion.  Cardiovascular: Negative for chest pain, leg swelling and palpitations.  Gastrointestinal: Positive for nausea. Negative for abdominal distention, abdominal pain, blood in stool, constipation, diarrhea and vomiting.  Endocrine: Negative for hot flashes.  Genitourinary: Negative for difficulty urinating, dyspareunia, dysuria, frequency, hematuria, vaginal bleeding and vaginal discharge.   Musculoskeletal: Negative for gait problem.       Positive for left knee pain secondary to arthritis.  Skin: Negative for itching and rash.  Neurological: Positive for light-headedness. Negative for dizziness, extremity weakness, gait problem, headaches and seizures.  Hematological: Negative for adenopathy. Does not bruise/bleed easily.  Psychiatric/Behavioral: Positive for depression. Negative for confusion and sleep disturbance. The patient is not nervous/anxious.        Depression controlled with antidepressants.    MEDICAL HISTORY: Past Medical History:  Diagnosis Date  . Anemia of chronic disease   . Asthma   . Atherosclerotic heart disease of native coronary artery without angina pectoris   . CKD (chronic kidney disease)   . COPD (chronic obstructive pulmonary disease) ([place])   . DM (diabetes mellitus), type 2 with renal complications ([place])   . Dysrhythmia   . Elevated ferritin 01/03/2017  . GAD (generalized anxiety disorder)   . Hypertension   .  Mixed hyperlipidemia due to type 2 diabetes mellitus ([place])   . Mixed incontinence    per medical records from [facility]  . Osteopenia   . Personal history of noncompliance with medical treatment, presenting hazards to health   . Shortness of breath dyspnea   . Suicidal ideation   . TB  (tuberculosis), treated    age 47  . Vitamin D deficiency     SURGICAL HISTORY: Past Surgical History:  Procedure Laterality Date  . ANKLE RECONSTRUCTION     right  . CARPAL TUNNEL RELEASE    . CORONARY ANGIOPLASTY      SOCIAL HISTORY: Social History   Socioeconomic History  . Marital status: Divorced    Spouse name: Not on file  . Number of children: Not on file  . Years of education: Not on file  . Highest education level: Not on file  Occupational History  . Not on file  Social Needs  . Financial resource strain: Not on file  . Food insecurity:    Worry: Not on file    Inability: Not on file  . Transportation needs:    Medical: Not on file    Non-medical: Not on file  Tobacco Use  . Smoking status: Former Research scientist (life sciences)  . Smokeless tobacco: Never Used  Substance and Sexual Activity  . Alcohol use: No  . Drug use: No  . Sexual activity: Never  Lifestyle  . Physical activity:    Days per week: Not on file    Minutes per session: Not on file  . Stress: Not on file  Relationships  . Social connections:    Talks on phone: Not on file    Gets together: Not on file    Attends religious service: Not on file    Active member of club or organization: Not on file    Attends meetings of clubs or organizations: Not on file    Relationship status: Not on file  . Intimate partner violence:    Fear of current or ex partner: Not on file    Emotionally abused: Not on file    Physically abused: Not on file    Forced sexual activity: Not on file  Other Topics Concern  . Not on file  Social History Narrative  . Not on file    FAMILY HISTORY Family History  Problem Relation Age of Onset  . Diabetes Mother     ALLERGIES:  has No Known Allergies.  MEDICATIONS:  Current Outpatient Medications  Medication Sig Dispense Refill  . albuterol (PROVENTIL HFA;VENTOLIN HFA) 108 (90 Base) MCG/ACT inhaler Inhale 2 puffs into the lungs every 6 (six) hours as needed for wheezing or  shortness of breath. 1 Inhaler 0  . amLODipine-benazepril (LOTREL) 5-20 MG capsule Take 1 capsule by mouth daily. 90 capsule 0  . aspirin EC 81 MG tablet Take 81 mg by mouth every morning.     . Biotin 10000 MCG TABS Take 1 tablet by mouth every morning.    . Blood Glucose Monitoring Suppl (ACCU-CHEK AVIVA PLUS) w/Device KIT Test twice a day. Dx code E11.9 1 kit 0  . carvedilol (COREG) 6.25 MG tablet Take 1 tablet by mouth twice daily. 90 tablet 1  . citalopram (CELEXA) 20 MG tablet Take 1 tablet (20 mg total) by mouth daily. 90 tablet 1  . fluticasone (FLONASE) 50 MCG/ACT nasal spray Place 2 sprays into both nostrils daily. 48 g 0  . Insulin Detemir (LEVEMIR FLEXTOUCH) 100 UNIT/ML Pen Inject 170  Units into the skin every morning. 55 pen 3  . liraglutide (VICTOZA) 18 MG/3ML SOPN Inject 1.8 mg subcutaneously daily. 9 pen 1  . omeprazole (PRILOSEC) 40 MG capsule Take 1 capsule (40 mg total) by mouth daily. 90 capsule 0  . simvastatin (ZOCOR) 20 MG tablet Take 1 tablet by mouth every night. 90 tablet 1  . traMADol (ULTRAM) 50 MG tablet Take 1 tablet by mouth daily as needed. 90 tablet 0  . traZODone (DESYREL) 50 MG tablet Take 50 mg by mouth at bedtime. Patient gets this from her therapist, not PCP    . TRUE METRIX BLOOD GLUCOSE TEST test strip USE AS DIRECTED THREE TIMES A DAY 100 each 2  . cholecalciferol (VITAMIN D) 1000 units tablet Take 1 tablet by mouth daily. (Patient not taking: Reported on 06/25/2017) 30 tablet 5  . fenofibrate (TRICOR) 145 MG tablet Take 1 tablet (145 mg total) by mouth daily. (Patient not taking: Reported on 06/25/2017) 90 tablet 1  . folic acid (FOLVITE) 1 MG tablet Take 1 tablet (1 mg total) by mouth daily. (Patient not taking: Reported on 06/25/2017) 90 tablet 0   No current facility-administered medications for this visit.     PHYSICAL EXAMINATION:  Vitals:   06/25/17 1320  BP: 129/70  Pulse: 82  Resp: 18  Temp: 98.5 F (36.9 C)  SpO2: 97%    Filed Weights    06/25/17 1320  Weight: 181 lb (82.1 kg)     Physical Exam  Constitutional: She is oriented to person, place, and time. She appears well-developed and well-nourished. No distress.  HENT:  Head: Normocephalic and atraumatic.  Mouth/Throat: Oropharynx is clear and moist. No oropharyngeal exudate.  Eyes: Pupils are equal, round, and reactive to light. Conjunctivae and EOM are normal. Right eye exhibits no discharge. Left eye exhibits no discharge. No scleral icterus.  Neck: Normal range of motion. Neck supple. No JVD present. No tracheal deviation present.  Cardiovascular: Normal rate, regular rhythm, normal heart sounds and intact distal pulses.  Pulmonary/Chest: Effort normal and breath sounds normal. No stridor. No respiratory distress. She has no wheezes. She has no rales. She exhibits no tenderness.  Abdominal: Soft. Bowel sounds are normal. She exhibits no distension and no mass. There is no tenderness.  Musculoskeletal: Normal range of motion. She exhibits no edema or deformity.  Lymphadenopathy:    She has no cervical adenopathy.  Neurological: She is alert and oriented to person, place, and time. She exhibits normal muscle tone. Coordination normal.  Skin: Skin is warm and dry. No rash noted. No erythema. No pallor.  Psychiatric: She has a normal mood and affect. Her behavior is normal. Judgment and thought content normal.  Vitals reviewed.  LABORATORY DATA:  Appointment on 06/25/2017  Component Date Value Ref Range Status  . WBC Count 06/25/2017 9.9  3.9 - 10.3 K/uL Final  . RBC 06/25/2017 3.35* 3.70 - 5.45 MIL/uL Final  . Hemoglobin 06/25/2017 9.9* 11.6 - 15.9 g/dL Final  . HCT 06/25/2017 30.5* 34.8 - 46.6 % Final  . MCV 06/25/2017 91.0  79.5 - 101.0 fL Final  . MCH 06/25/2017 29.6  25.1 - 34.0 pg Final  . MCHC 06/25/2017 32.5  31.5 - 36.0 g/dL Final  . RDW 06/25/2017 15.0* 11.2 - 14.5 % Final  . Platelet Count 06/25/2017 346  145 - 400 K/uL Final  . Neutrophils Relative  % 06/25/2017 72  % Final  . Neutro Abs 06/25/2017 7.1* 1.5 - 6.5 K/uL Final  .  Lymphocytes Relative 06/25/2017 20  % Final  . Lymphs Abs 06/25/2017 1.9  0.9 - 3.3 K/uL Final  . Monocytes Relative 06/25/2017 7  % Final  . Monocytes Absolute 06/25/2017 0.7  0.1 - 0.9 K/uL Final  . Eosinophils Relative 06/25/2017 1  % Final  . Eosinophils Absolute 06/25/2017 0.1  0.0 - 0.5 K/uL Final  . Basophils Relative 06/25/2017 0  % Final  . Basophils Absolute 06/25/2017 0.0  0.0 - 0.1 K/uL Final   Performed at [facility], [place] [address] [place], [place] [address]  . Sodium 06/25/2017 140  136 - 145 mmol/L Final  . Potassium 06/25/2017 3.8  3.5 - 5.1 mmol/L Final  . Chloride 06/25/2017 105  98 - 109 mmol/L Final  . CO2 06/25/2017 25  22 - 29 mmol/L Final  . Glucose, Bld 06/25/2017 103  70 - 140 mg/dL Final  . BUN 06/25/2017 37* 7 - 26 mg/dL Final  . Creatinine 06/25/2017 1.84* 0.60 - 1.10 mg/dL Final  . Calcium 06/25/2017 9.9  8.4 - 10.4 mg/dL Final  . Total Protein 06/25/2017 8.0  6.4 - 8.3 g/dL Final  . Albumin 06/25/2017 3.6  3.5 - 5.0 g/dL Final  . AST 06/25/2017 17  5 - 34 U/L Final  . ALT 06/25/2017 9  0 - 55 U/L Final  . Alkaline Phosphatase 06/25/2017 50  40 - 150 U/L Final  . Total Bilirubin 06/25/2017 0.2  0.2 - 1.2 mg/dL Final  . GFR, Est Non Af Am 06/25/2017 26* >60 mL/min Final  . GFR, Est AFR Am 06/25/2017 30* >60 mL/min Final   Comment: (NOTE) The eGFR has been calculated using the CKD EPI equation. This calculation has not been validated in all clinical situations. eGFR's persistently <60 mL/min signify possible Chronic Kidney Disease.   Georgiann Hahn gap 06/25/2017 10  3 - 11 Final   Performed at [facility], [place] [address] [place], [place] [address]  . Folate 06/25/2017 47.4  >5.9 ng/mL Final   Comment: RESULTS CONFIRMED BY MANUAL DILUTION Performed at [facility] [address], [place], [place] [address]    . Vitamin B-12 06/25/2017 254  180 - 914 pg/mL Final   Comment: (NOTE) This assay is not validated for testing neonatal or myeloproliferative syndrome specimens for Vitamin B12 levels. Performed at [facility], [place] [address] [place],  [address]   Abstract on 06/20/2017  Component Date Value Ref Range Status  . Cologuard 06/04/2017 Negative   Final  Abstract on 06/10/2017  Component Date Value Ref Range Status  . HM Diabetic Eye Exam 03/25/2012 Retinopathy* No Retinopathy Final  . HM Diabetic Eye Exam 09/07/2013 Retinopathy* No Retinopathy Final    RADIOGRAPHIC STUDIES:  No results found.  ASSESSMENT: This is a very pleasant 72 year old African-American female who presented for evaluation of normocytic, normochromic anemia.  Anemia is likely due to underlying chronic kidney disease.  PLAN:  The patient was seen with Dr. Julien Nordmann.  He had a lengthy discussion with the patient today regarding her condition and further investigation to confirm her diagnosis.  Recommend that she have a repeat CBC, comprehensive metabolic panel, vitamin L38 level, and folic acid level.  We also recommend the patient proceed with a bone marrow biopsy to rule out a myeloproliferative disorder. Repeat CBC today showed hemoglobin of 9.9 and a normal white blood cell and platelet count.  Creatinine is elevated at 1.84 and the patient has a GFR of  approximately 30 mL/min.  Vitamin B12 and folate levels are normal. We will arrange for the patient have a bone marrow biopsy to be performed within the next week.  She will follow-up approximately 1 week after the bone marrow biopsy to discuss the results and treatment options.  May consider Aranesp injections pending the results of the bone marrow biopsy.  The patient was advised to call immediately if she has any concerning symptoms in the interval.  The patient voices understanding of the current disease status and treatment options and is  in agreement with the current plan.  All questions were answered.  The patient is to call the clinic with any problems, questions, or concerns.  We can certainly see the patient much sooner if necessary.  Thank you for allowing [place] to participate in the care of [facility]. We will continue to follow up the patient with you an assist in her care.   Orders Placed This Encounter  Procedures  . CBC with Differential (Cancer Center Only)    Standing Status:   Future    Number of Occurrences:   1    Standing Expiration Date:   06/26/2018  . CMP ([facility] only)    Standing Status:   Future    Number of Occurrences:   1    Standing Expiration Date:   06/26/2018  . Folate, Serum    Standing Status:   Future    Number of Occurrences:   1    Standing Expiration Date:   06/25/2018  . Vitamin B12    Standing Status:   Future    Number of Occurrences:   1    Standing Expiration Date:   06/25/2018     Mikey Bussing, NP  06/25/2017 4:56 PM   ADDENDUM: Hematology/Oncology Attending: I had a face-to-face encounter with the patient today.  I recommended her care plan.  I reviewed her records.  This is a very pleasant 72 years old African-American female with persistent anemia for several years.  She had several studies performed recently including iron study and ferritin that were unremarkable.  The patient has chronic kidney disease that likely the etiology for her anemia of chronic disease. I had a lengthy discussion with the patient and her daughter today about her current condition and further investigation and treatment options.  We will order several studies today including repeat CBC, comprehensive metabolic panel, serum folate, serum vitamin B12. We will arrange for the patient to have bone marrow biopsy and aspirate to rule any other underlying etiology of her persistent anemia. If the patient has no concerning findings in the bone marrow, she may benefit from treatment with red blood  cells growth factors like Aranesp. The patient will come back for follow-up visit in 2-3 weeks for reevaluation and more detailed discussion of her treatment options. She was advised to call immediately if she has any concerning symptoms in the interval.  Disclaimer: This note was dictated with voice recognition software. Similar sounding words can inadvertently be transcribed and may be missed upon review.  Eilleen Kempf, MD 06/25/17

## 2017-06-26 ENCOUNTER — Telehealth: Payer: Self-pay

## 2017-06-26 NOTE — Telephone Encounter (Signed)
-----  Message from Gardenia Phlegm, NP sent at 06/26/2017  8:24 AM EDT ----- Regarding: FW: Bone marrow biopsy Wed June 5 at 730 am? ----- Message ----- From: Maryanna Shape, NP Sent: 06/25/2017   4:06 PM To: Gardenia Phlegm, NP Subject: Bone marrow biopsy                             Mendel Ryder  I saw this patient earlier today as a new patient.  She has normocytic, normochromic anemia.  Her anemia is likely due to chronic disease however we would like to get a bone marrow biopsy on her.  Can you look at your schedule and see when you can do this?  Please let me know the date so that I can arrange for a follow-up visit after the bone marrow biopsy.  Thanks

## 2017-06-26 NOTE — Telephone Encounter (Signed)
Spoke with patient. Ok to schedule bone marrow biopsy for wed 07/02/17 at 7:30 am.

## 2017-06-28 ENCOUNTER — Other Ambulatory Visit: Payer: Self-pay | Admitting: Endocrinology

## 2017-07-01 ENCOUNTER — Other Ambulatory Visit: Payer: Self-pay | Admitting: Family Medicine

## 2017-07-01 ENCOUNTER — Other Ambulatory Visit: Payer: Self-pay | Admitting: Endocrinology

## 2017-07-01 NOTE — Telephone Encounter (Signed)
ok 

## 2017-07-01 NOTE — Telephone Encounter (Signed)
Is this okay to refill? 

## 2017-07-02 ENCOUNTER — Inpatient Hospital Stay: Payer: Medicare HMO | Attending: Oncology | Admitting: Adult Health

## 2017-07-02 ENCOUNTER — Other Ambulatory Visit: Payer: Self-pay | Admitting: *Deleted

## 2017-07-02 ENCOUNTER — Telehealth: Payer: Self-pay | Admitting: Adult Health

## 2017-07-02 ENCOUNTER — Inpatient Hospital Stay: Payer: Medicare HMO

## 2017-07-02 VITALS — BP 109/63 | HR 75 | Temp 98.3°F | Resp 18

## 2017-07-02 DIAGNOSIS — I129 Hypertensive chronic kidney disease with stage 1 through stage 4 chronic kidney disease, or unspecified chronic kidney disease: Secondary | ICD-10-CM | POA: Diagnosis not present

## 2017-07-02 DIAGNOSIS — I2510 Atherosclerotic heart disease of native coronary artery without angina pectoris: Secondary | ICD-10-CM

## 2017-07-02 DIAGNOSIS — Z7982 Long term (current) use of aspirin: Secondary | ICD-10-CM

## 2017-07-02 DIAGNOSIS — Z79899 Other long term (current) drug therapy: Secondary | ICD-10-CM

## 2017-07-02 DIAGNOSIS — D62 Acute posthemorrhagic anemia: Secondary | ICD-10-CM

## 2017-07-02 DIAGNOSIS — E1122 Type 2 diabetes mellitus with diabetic chronic kidney disease: Secondary | ICD-10-CM | POA: Diagnosis not present

## 2017-07-02 DIAGNOSIS — D631 Anemia in chronic kidney disease: Secondary | ICD-10-CM

## 2017-07-02 DIAGNOSIS — D464 Refractory anemia, unspecified: Secondary | ICD-10-CM

## 2017-07-02 DIAGNOSIS — N189 Chronic kidney disease, unspecified: Secondary | ICD-10-CM

## 2017-07-02 DIAGNOSIS — J449 Chronic obstructive pulmonary disease, unspecified: Secondary | ICD-10-CM | POA: Diagnosis not present

## 2017-07-02 DIAGNOSIS — R32 Unspecified urinary incontinence: Secondary | ICD-10-CM | POA: Diagnosis not present

## 2017-07-02 DIAGNOSIS — E785 Hyperlipidemia, unspecified: Secondary | ICD-10-CM

## 2017-07-02 DIAGNOSIS — D649 Anemia, unspecified: Secondary | ICD-10-CM | POA: Diagnosis not present

## 2017-07-02 DIAGNOSIS — E559 Vitamin D deficiency, unspecified: Secondary | ICD-10-CM | POA: Diagnosis not present

## 2017-07-02 DIAGNOSIS — D72822 Plasmacytosis: Secondary | ICD-10-CM | POA: Diagnosis not present

## 2017-07-02 DIAGNOSIS — F419 Anxiety disorder, unspecified: Secondary | ICD-10-CM

## 2017-07-02 LAB — CBC WITH DIFFERENTIAL (CANCER CENTER ONLY)
Basophils Absolute: 0 10*3/uL (ref 0.0–0.1)
Basophils Relative: 0 %
Eosinophils Absolute: 0.2 10*3/uL (ref 0.0–0.5)
Eosinophils Relative: 2 %
HCT: 29.6 % — ABNORMAL LOW (ref 34.8–46.6)
Hemoglobin: 9.6 g/dL — ABNORMAL LOW (ref 11.6–15.9)
Lymphocytes Relative: 19 %
Lymphs Abs: 1.4 10*3/uL (ref 0.9–3.3)
MCH: 30 pg (ref 25.1–34.0)
MCHC: 32.4 g/dL (ref 31.5–36.0)
MCV: 92.5 fL (ref 79.5–101.0)
Monocytes Absolute: 0.6 10*3/uL (ref 0.1–0.9)
Monocytes Relative: 8 %
Neutro Abs: 5 10*3/uL (ref 1.5–6.5)
Neutrophils Relative %: 71 %
Platelet Count: 340 10*3/uL (ref 145–400)
RBC: 3.2 MIL/uL — ABNORMAL LOW (ref 3.70–5.45)
RDW: 15 % — ABNORMAL HIGH (ref 11.2–14.5)
WBC Count: 7.1 10*3/uL (ref 3.9–10.3)

## 2017-07-02 MED ORDER — LORAZEPAM 1 MG PO TABS
ORAL_TABLET | ORAL | Status: AC
  Filled 2017-07-02: qty 1

## 2017-07-02 MED ORDER — LORAZEPAM 1 MG PO TABS
1.0000 mg | ORAL_TABLET | Freq: Once | ORAL | Status: AC
  Administered 2017-07-02: 1 mg via ORAL

## 2017-07-02 NOTE — Progress Notes (Signed)
Pt arrived for Bone Marrow Biopsy preformed by Wilber Bihari.  Pt tolerated procedure well, stayed 30 minutes post VSS and discharged home.

## 2017-07-02 NOTE — Progress Notes (Signed)
INDICATION: refractory anemia   Bone Marrow Biopsy and Aspiration Procedure Note   Informed consent was obtained and potential risks including bleeding, infection and pain were reviewed with the patient.  The patient's name, date of birth, identification, consent and allergies were verified prior to the start of procedure and time out was performed.  The right posterior iliac crest was chosen as the site of biopsy.  The skin was prepped with ChloraPrep.   12 cc of 2% lidocaine was used to provide local anaesthesia.   10 cc of bone marrow aspirate was obtained.  After three attempts, unable to obtain core biopsy.  Pressure was applied to the biopsy site and bandage was placed over the biopsy site. Patient was made to lie on the back for 30 mins prior to discharge.  The procedure was tolerated well. COMPLICATIONS: None BLOOD LOSS: none The patient was discharged home in stable condition with a 1 week follow up to review results.  Patient was provided with post bone marrow biopsy instructions and instructed to call if there was any bleeding or worsening pain.  Specimens sent for flow cytometry, cytogenetics and additional studies.  Signed Scot Dock, NP

## 2017-07-02 NOTE — Patient Instructions (Signed)
Bone Marrow Aspiration and Bone Marrow Biopsy, Adult, Care After This sheet gives you information about how to care for yourself after your procedure. Your health care provider may also give you more specific instructions. If you have problems or questions, contact your health care provider. What can I expect after the procedure? After the procedure, it is common to have:  Mild pain and tenderness.  Swelling.  Bruising.  Follow these instructions at home:  Take over-the-counter or prescription medicines only as told by your health care provider.  Do not take baths, swim, or use a hot tub until your health care provider approves. Ask if you can take a shower or have a sponge bath.  Follow instructions from your health care provider about how to take care of the puncture site. Make sure you: ? Wash your hands with soap and water before you change your bandage (dressing). If soap and water are not available, use hand sanitizer. ? Change your dressing as told by your health care provider.  Check your puncture siteevery day for signs of infection. Check for: ? More redness, swelling, or pain. ? More fluid or blood. ? Warmth. ? Pus or a bad smell.  Return to your normal activities as told by your health care provider. Ask your health care provider what activities are safe for you.  Do not drive for 24 hours if you were given a medicine to help you relax (sedative).  Keep all follow-up visits as told by your health care provider. This is important. Contact a health care provider if:  You have more redness, swelling, or pain around the puncture site.  You have more fluid or blood coming from the puncture site.  Your puncture site feels warm to the touch.  You have pus or a bad smell coming from the puncture site.  You have a fever.  Your pain is not controlled with medicine. This information is not intended to replace advice given to you by your health care provider. Make sure  you discuss any questions you have with your health care provider. Document Released: 08/03/2004 Document Revised: 08/04/2015 Document Reviewed: 06/28/2015 Elsevier Interactive Patient Education  2018 [employer].

## 2017-07-02 NOTE — Telephone Encounter (Signed)
Per 6/5 no los

## 2017-07-04 ENCOUNTER — Ambulatory Visit (INDEPENDENT_AMBULATORY_CARE_PROVIDER_SITE_OTHER): Payer: Medicare HMO | Admitting: Endocrinology

## 2017-07-04 VITALS — BP 110/60 | HR 77 | Ht 64.25 in | Wt 180.0 lb

## 2017-07-04 DIAGNOSIS — E1122 Type 2 diabetes mellitus with diabetic chronic kidney disease: Secondary | ICD-10-CM

## 2017-07-04 DIAGNOSIS — N183 Chronic kidney disease, stage 3 unspecified: Secondary | ICD-10-CM

## 2017-07-04 DIAGNOSIS — Z794 Long term (current) use of insulin: Secondary | ICD-10-CM

## 2017-07-04 MED ORDER — INSULIN DETEMIR 100 UNIT/ML FLEXPEN: 160.0000 [IU] | PEN_INJECTOR | SUBCUTANEOUS | 2 refills | Status: DC

## 2017-07-04 NOTE — Patient Instructions (Addendum)
check your blood sugar twice a day.  vary the time of day when you check, between before the 3 meals, and at bedtime.  also check if you have symptoms of your blood sugar being too high or too low.  please keep a record of the readings and bring it to your next appointment here (or you can bring the meter itself).  You can write it on any piece of paper.  please call us sooner if your blood sugar goes below 70, or if you have a lot of readings over 200.   please reduce the levemir to 160 units each morning.   On this type of insulin schedule, you should eat meals on a regular schedule.  If a meal is missed or significantly delayed, your blood sugar could go low.  Please come back for a follow-up appointment in 4 months.

## 2017-07-04 NOTE — Progress Notes (Signed)
Subjective:    Patient ID: Courtney Goodman, female    DOB: [birth year], 72 y.o.   MRN: [id number]  HPI Pt returns for f/u of diabetes mellitus: DM type: Insulin-requiring type 2 Dx'ed: 1324 Complications: renal insufficiency and CAD Therapy: insulin since 2009, and victoza. GDM: never DKA: never Severe hypoglycemia: once (2012). Pancreatitis: never.  Other: she is on qd insulin, due to h/o noncompliance.   Interval history:  pt states she feels well in general.  no cbg record, but states she seldom has hypoglycemia, and these episodes are mild.   Past Medical History:  Diagnosis Date  . Anemia of chronic disease   . Asthma   . Atherosclerotic heart disease of native coronary artery without angina pectoris   . CKD (chronic kidney disease)   . COPD (chronic obstructive pulmonary disease) ([place])   . DM (diabetes mellitus), type 2 with renal complications ([place])   . Dysrhythmia   . Elevated ferritin 01/03/2017  . GAD (generalized anxiety disorder)   . Hypertension   . Mixed hyperlipidemia due to type 2 diabetes mellitus ([place])   . Mixed incontinence    per medical records from [place]  . Osteopenia   . Personal history of noncompliance with medical treatment, presenting hazards to health   . Shortness of breath dyspnea   . Suicidal ideation   . TB (tuberculosis), treated    age 57  . Vitamin D deficiency     Past Surgical History:  Procedure Laterality Date  . ANKLE RECONSTRUCTION     right  . CARPAL TUNNEL RELEASE    . CORONARY ANGIOPLASTY      Social History   Socioeconomic History  . Marital status: Divorced    Spouse name: Not on file  . Number of children: Not on file  . Years of education: Not on file  . Highest education level: Not on file  Occupational History  . Not on file  Social Needs  . Financial resource strain: Not on file  . Food insecurity:    Worry: Not on file    Inability: Not on file  . Transportation needs:    Medical: Not on file   Non-medical: Not on file  Tobacco Use  . Smoking status: Former Research scientist (life sciences)  . Smokeless tobacco: Never Used  Substance and Sexual Activity  . Alcohol use: No  . Drug use: No  . Sexual activity: Never  Lifestyle  . Physical activity:    Days per week: Not on file    Minutes per session: Not on file  . Stress: Not on file  Relationships  . Social connections:    Talks on phone: Not on file    Gets together: Not on file    Attends religious service: Not on file    Active member of club or organization: Not on file    Attends meetings of clubs or organizations: Not on file    Relationship status: Not on file  . Intimate partner violence:    Fear of current or ex partner: Not on file    Emotionally abused: Not on file    Physically abused: Not on file    Forced sexual activity: Not on file  Other Topics Concern  . Not on file  Social History Narrative  . Not on file    Current Outpatient Medications on File Prior to Visit  Medication Sig Dispense Refill  . albuterol (PROVENTIL HFA;VENTOLIN HFA) 108 (90 Base) MCG/ACT inhaler Inhale 2 puffs into the  lungs every 6 (six) hours as needed for wheezing or shortness of breath. 1 Inhaler 0  . amLODipine-benazepril (LOTREL) 5-20 MG capsule Take 1 capsule by mouth daily. 90 capsule 0  . aspirin EC 81 MG tablet Take 81 mg by mouth every morning.     . Biotin 10000 MCG TABS Take 1 tablet by mouth every morning.    . Blood Glucose Monitoring Suppl (ACCU-CHEK AVIVA PLUS) w/Device KIT Test twice a day. Dx code E11.9 1 kit 0  . carvedilol (COREG) 6.25 MG tablet Take 1 tablet by mouth twice daily. 90 tablet 1  . cholecalciferol (VITAMIN D) 1000 units tablet Take 1 tablet by mouth daily. 30 tablet 5  . citalopram (CELEXA) 20 MG tablet Take 1 tablet (20 mg total) by mouth daily. 90 tablet 1  . fenofibrate (TRICOR) 145 MG tablet Take 1 tablet (145 mg total) by mouth daily. 90 tablet 1  . fluticasone (FLONASE) 50 MCG/ACT nasal spray Place 2 sprays into  both nostrils daily. 48 g 0  . folic acid (FOLVITE) 1 MG tablet Take 1 tablet (1 mg total) by mouth daily. 90 tablet 1  . liraglutide (VICTOZA) 18 MG/3ML SOPN Inject 1.8 mg subcutaneously daily. 3 pen 11  . omeprazole (PRILOSEC) 40 MG capsule Take 1 capsule (40 mg total) by mouth daily. 90 capsule 1  . simvastatin (ZOCOR) 20 MG tablet Take 1 tablet by mouth every night. 90 tablet 1  . traMADol (ULTRAM) 50 MG tablet Take 1 tablet by mouth daily as needed. 90 tablet 0  . traZODone (DESYREL) 50 MG tablet Take 50 mg by mouth at bedtime. Patient gets this from her therapist, not PCP    . TRUE METRIX BLOOD GLUCOSE TEST test strip USE AS DIRECTED THREE TIMES A DAY 100 each 2   No current facility-administered medications on file prior to visit.     No Known Allergies  Family History  Problem Relation Age of Onset  . Diabetes Mother     BP 110/60   Pulse 77   Ht 5' 4.25" (1.632 m)   Wt 180 lb (81.6 kg)   SpO2 97%   BMI 30.66 kg/m    Review of Systems Denies LOC.      Objective:   Physical Exam VITAL SIGNS:  See vs page GENERAL: no distress Pulses: foot pulses are intact bilaterally.   MSK: no deformity of the feet or ankles.  CV: no edema of the legs or ankles Skin:  no ulcer on the feet or ankles.  normal color and temp on the feet and ankles.  Old healed surgical scars on the right ankle and foot.  There is a 2 cm heavy callus at the plantar aspect of the left foot Neuro: sensation is intact to touch on the feet and ankles.   Ext: There is bilateral onychomycosis of the toenails, and some are ingrown.    Lab Results  Component Value Date   HGBA1C 6.3 03/07/2017      Assessment & Plan:  Insulin-requiring type 2 DM: overcontrolled, given this regimen, which does match insulin to her changing needs throughout the day.     Patient Instructions  check your blood sugar twice a day.  vary the time of day when you check, between before the 3 meals, and at bedtime.  also check  if you have symptoms of your blood sugar being too high or too low.  please keep a record of the readings and bring it to your next  appointment here (or you can bring the meter itself).  You can write it on any piece of paper.  please call us sooner if your blood sugar goes below 70, or if you have a lot of readings over 200.   please reduce the levemir to 160 units each morning.   On this type of insulin schedule, you should eat meals on a regular schedule.  If a meal is missed or significantly delayed, your blood sugar could go low.  Please come back for a follow-up appointment in 4 months.

## 2017-07-09 ENCOUNTER — Inpatient Hospital Stay: Admission: RE | Admit: 2017-07-09 | Payer: Medicare HMO | Source: Ambulatory Visit

## 2017-07-09 ENCOUNTER — Ambulatory Visit: Payer: Medicare HMO

## 2017-07-11 ENCOUNTER — Encounter (HOSPITAL_COMMUNITY): Payer: Self-pay | Admitting: Internal Medicine

## 2017-07-11 NOTE — Progress Notes (Signed)
[facility] OFFICE PROGRESS NOTE  [name], NP-C [place] [address]  DIAGNOSIS: Anemia due to chronic kidney disease.  PRIOR THERAPY: None.  CURRENT THERAPY: None.  INTERVAL HISTORY: Courtney Goodman 72 y.o. female returns for routine follow-up visit by herself.  The patient is feeling fine today has no specific complaints except for shortness of breath with exertion and remittent nausea.  She denies fevers and chills.  Denies chest pain, shortness of breath at rest, cough, hemoptysis.  Denies vomiting, constipation, diarrhea.  Denies bleeding.  Denies recent weight loss or night sweats.  The patient had a bone marrow biopsy and is here to discuss the results.  MEDICAL HISTORY: Past Medical History:  Diagnosis Date  . Anemia of chronic disease   . Asthma   . Atherosclerotic heart disease of native coronary artery without angina pectoris   . CKD (chronic kidney disease)   . COPD (chronic obstructive pulmonary disease) ([place])   . DM (diabetes mellitus), type 2 with renal complications ([place])   . Dysrhythmia   . Elevated ferritin 01/03/2017  . GAD (generalized anxiety disorder)   . Hypertension   . Mixed hyperlipidemia due to type 2 diabetes mellitus ([place])   . Mixed incontinence    per medical records from [place]  . Osteopenia   . Personal history of noncompliance with medical treatment, presenting hazards to health   . Shortness of breath dyspnea   . Suicidal ideation   . TB (tuberculosis), treated    age 10  . Vitamin D deficiency     ALLERGIES:  has No Known Allergies.  MEDICATIONS:  Current Outpatient Medications  Medication Sig Dispense Refill  . albuterol (PROVENTIL HFA;VENTOLIN HFA) 108 (90 Base) MCG/ACT inhaler Inhale 2 puffs into the lungs every 6 (six) hours as needed for wheezing or shortness of breath. 1 Inhaler 0  . amLODipine-benazepril (LOTREL) 5-20 MG capsule Take 1 capsule by mouth daily. 90 capsule 0  . aspirin EC  81 MG tablet Take 81 mg by mouth every morning.     . Biotin 10000 MCG TABS Take 1 tablet by mouth every morning.    . Blood Glucose Monitoring Suppl (ACCU-CHEK AVIVA PLUS) w/Device KIT Test twice a day. Dx code E11.9 1 kit 0  . carvedilol (COREG) 6.25 MG tablet Take 1 tablet by mouth twice daily. 90 tablet 1  . cholecalciferol (VITAMIN D) 1000 units tablet Take 1 tablet by mouth daily. 30 tablet 5  . citalopram (CELEXA) 20 MG tablet Take 1 tablet (20 mg total) by mouth daily. 90 tablet 1  . fenofibrate (TRICOR) 145 MG tablet Take 1 tablet (145 mg total) by mouth daily. 90 tablet 1  . fluticasone (FLONASE) 50 MCG/ACT nasal spray Place 2 sprays into both nostrils daily. 48 g 0  . folic acid (FOLVITE) 1 MG tablet Take 1 tablet (1 mg total) by mouth daily. 90 tablet 1  . Insulin Detemir (LEVEMIR FLEXTOUCH) 100 UNIT/ML Pen Inject 160 Units into the skin every morning. 52 pen 2  . liraglutide (VICTOZA) 18 MG/3ML SOPN Inject 1.8 mg subcutaneously daily. 3 pen 11  . omeprazole (PRILOSEC) 40 MG capsule Take 1 capsule (40 mg total) by mouth daily. 90 capsule 1  . simvastatin (ZOCOR) 20 MG tablet Take 1 tablet by mouth every night. 90 tablet 1  . traMADol (ULTRAM) 50 MG tablet Take 1 tablet by mouth daily as needed. 90 tablet 0  . traZODone (DESYREL) 50 MG tablet Take 50  mg by mouth at bedtime. Patient gets this from her therapist, not PCP    . TRUE METRIX BLOOD GLUCOSE TEST test strip USE AS DIRECTED THREE TIMES A DAY 100 each 2   No current facility-administered medications for this visit.     SURGICAL HISTORY:  Past Surgical History:  Procedure Laterality Date  . ANKLE RECONSTRUCTION     right  . CARPAL TUNNEL RELEASE    . CORONARY ANGIOPLASTY      REVIEW OF SYSTEMS:   Review of Systems  Constitutional: Negative for appetite change, chills, fatigue, fever and unexpected weight change.  HENT:   Negative for mouth sores, nosebleeds, sore throat and trouble swallowing.   Eyes: Negative for  icterus.  Respiratory: Negative for cough, hemoptysis, shortness of breath at rest and wheezing.  Positive for shortness of breath with exertion.  Cardiovascular: Negative for chest pain and leg swelling.  Gastrointestinal: Negative for abdominal pain, constipation, diarrhea, and vomiting. Positive for intermittent nausea. Genitourinary: Negative for bladder incontinence, difficulty urinating, dysuria, frequency and hematuria.   Musculoskeletal: Negative for back pain, gait problem, neck pain and neck stiffness.  Skin: Negative for itching and rash.  Neurological: Negative for dizziness, extremity weakness, gait problem, headaches, light-headedness and seizures.  Hematological: Negative for adenopathy. Does not bruise/bleed easily.  Psychiatric/Behavioral: Negative for confusion, depression and sleep disturbance. The patient is not nervous/anxious.     PHYSICAL EXAMINATION:  Blood pressure 110/63, pulse 73, temperature 97.8 F (36.6 C), temperature source Oral, resp. rate 18, height 5' 4.25" (1.632 m), weight 182 lb (82.6 kg), SpO2 99 %.  ECOG PERFORMANCE STATUS: 1 - Symptomatic but completely ambulatory  Physical Exam  Constitutional: Oriented to person, place, and time and well-developed, well-nourished, and in no distress. No distress.  HENT:  Head: Normocephalic and atraumatic.  Mouth/Throat: Oropharynx is clear and moist. No oropharyngeal exudate.  Eyes: Conjunctivae are normal. Right eye exhibits no discharge. Left eye exhibits no discharge. No scleral icterus.  Neck: Normal range of motion. Neck supple.  Cardiovascular: Normal rate, regular rhythm, normal heart sounds and intact distal pulses.   Pulmonary/Chest: Effort normal and breath sounds normal. No respiratory distress. No wheezes. No rales.  Abdominal: Soft. Bowel sounds are normal. Exhibits no distension and no mass. There is no tenderness.  Musculoskeletal: Normal range of motion. Exhibits no edema.  Lymphadenopathy:     No cervical adenopathy.  Neurological: Alert and oriented to person, place, and time. Exhibits normal muscle tone. Gait normal. Coordination normal.  Skin: Skin is warm and dry. No rash noted. Not diaphoretic. No erythema. No pallor.  Psychiatric: Mood, memory and judgment normal.  Vitals reviewed.  LABORATORY DATA: Lab Results  Component Value Date   WBC 7.1 07/02/2017   HGB 9.6 (L) 07/02/2017   HCT 29.6 (L) 07/02/2017   MCV 92.5 07/02/2017   PLT 340 07/02/2017      Chemistry      Component Value Date/Time   NA 140 06/25/2017 1437   NA 146 (H) 05/07/2017 1147   NA 137 05/29/2011 1946   K 3.8 06/25/2017 1437   K 3.9 05/29/2011 1946   CL 105 06/25/2017 1437   CL 100 05/29/2011 1946   CO2 25 06/25/2017 1437   CO2 28 05/29/2011 1946   BUN 37 (H) 06/25/2017 1437   BUN 28 (H) 05/07/2017 1147   BUN 24 (H) 05/29/2011 1946   CREATININE 1.84 (H) 06/25/2017 1437   CREATININE 1.56 (H) 01/01/2017 0941      Component Value  Date/Time   CALCIUM 9.9 06/25/2017 1437   CALCIUM 9.8 05/29/2011 1946   ALKPHOS 50 06/25/2017 1437   ALKPHOS 86 05/29/2011 1946   AST 17 06/25/2017 1437   ALT 9 06/25/2017 1437   ALT 15 05/29/2011 1946   BILITOT 0.2 06/25/2017 1437       RADIOGRAPHIC STUDIES:  No results found.  PATHOLOGY:  Diagnosis Bone Marrow, Aspirate and Clot - HYPERCELLULAR BONE MARROW FOR AGE WITH TRILINEAGE HEMATOPOIESIS. - SEVERAL SMALL LYMPHOID AGGREGATES PRESENT. - MILD PLASMACYTOSIS (PLASMA CELLS 5%) - SEE COMMENT. PERIPHERAL BLOOD: - NORMOCYTIC-NORMOCHROMIC ANEMIA.  Bone Marrow Flow Cytometry - NO MONOCLONAL B CELL POPULATION OR ABNORMAL T CELL PHENOTYPE IDENTIFIED. Susanne Greenhouse MD Pathologist, Electronic Signature (Case signed 07/04/2017)  Cytogenetics are normal.  ASSESSMENT/PLAN:  Anemia of chronic disease This is a very pleasant 72 year old African-American female who presented for evaluation of normocytic, normochromic anemia.    She had a bone marrow  biopsy and is here to discuss the results.  The patient was seen with Dr. Julien Nordmann.  Bone marrow biopsy results were discussed with the patient which did not show any concerning findings or myeloproliferative disorder.  We discussed with the patient that her anemia is likely due to her chronic kidney disease.  Her most recent GFR was approximately 30.  We discussed proceeding with treatment consisting of Aranesp every 3 weeks.  Recommend that the patient talk with her nephrologist regarding administration of this medication at their office.  If her nephrologist does not wish to administer this medication, we can certainly do so.  She was instructed to call us and let us know how she would like to proceed.  For now, the patient will be scheduled back on as-needed basis.  The patient was advised to call immediately if she has any concerning symptoms in the interval.  The patient voices understanding of the current disease status and treatment options and is in agreement with the current plan.  All questions were answered.  The patient is to call the clinic with any problems, questions, or concerns.  We can certainly see the patient much sooner if necessary.   No orders of the defined types were placed in this encounter.  Mikey Bussing, DNP, AGPCNP-BC, AOCNP 07/14/17  ADDENDUM: Hematology/Oncology Attending: I had a face-to-face encounter with the patient today.  I recommended her care plan.  This is a very pleasant 72 years old African-American female presented with normocytic normochromic anemia highly suspicious for anemia of chronic disease secondary to renal insufficiency.  The patient underwent several studies recently including bone marrow biopsy and aspirate that showed no concerning findings. I discussed the bone marrow results with the patient today.  I gave her the option of continuous observation and close monitoring versus consideration of treatment with erythrocyte stimulating factors  like Aranesp 300 mcg subcutaneously every 3 weeks.  The patient has renal insufficiency and she may benefit from this treatment under the care of her nephrologist but would be happy to do it at the cancer center if needed. We will see her on as-needed basis at this point.  The patient was advised to call if she has any concerning symptoms.  Disclaimer: This note was dictated with voice recognition software. Similar sounding words can inadvertently be transcribed and may be missed upon review. Eilleen Kempf, MD 07/14/17

## 2017-07-14 ENCOUNTER — Encounter: Payer: Self-pay | Admitting: Oncology

## 2017-07-14 ENCOUNTER — Other Ambulatory Visit: Payer: Self-pay

## 2017-07-14 ENCOUNTER — Inpatient Hospital Stay (HOSPITAL_BASED_OUTPATIENT_CLINIC_OR_DEPARTMENT_OTHER): Payer: Medicare HMO | Admitting: Oncology

## 2017-07-14 VITALS — BP 110/63 | HR 73 | Temp 97.8°F | Resp 18 | Ht 64.25 in | Wt 182.0 lb

## 2017-07-14 DIAGNOSIS — E1122 Type 2 diabetes mellitus with diabetic chronic kidney disease: Secondary | ICD-10-CM

## 2017-07-14 DIAGNOSIS — Z7982 Long term (current) use of aspirin: Secondary | ICD-10-CM

## 2017-07-14 DIAGNOSIS — D638 Anemia in other chronic diseases classified elsewhere: Secondary | ICD-10-CM

## 2017-07-14 DIAGNOSIS — E559 Vitamin D deficiency, unspecified: Secondary | ICD-10-CM | POA: Diagnosis not present

## 2017-07-14 DIAGNOSIS — I129 Hypertensive chronic kidney disease with stage 1 through stage 4 chronic kidney disease, or unspecified chronic kidney disease: Secondary | ICD-10-CM

## 2017-07-14 DIAGNOSIS — D631 Anemia in chronic kidney disease: Secondary | ICD-10-CM

## 2017-07-14 DIAGNOSIS — J449 Chronic obstructive pulmonary disease, unspecified: Secondary | ICD-10-CM | POA: Diagnosis not present

## 2017-07-14 DIAGNOSIS — N189 Chronic kidney disease, unspecified: Secondary | ICD-10-CM

## 2017-07-14 DIAGNOSIS — Z79899 Other long term (current) drug therapy: Secondary | ICD-10-CM | POA: Diagnosis not present

## 2017-07-14 DIAGNOSIS — F419 Anxiety disorder, unspecified: Secondary | ICD-10-CM

## 2017-07-14 DIAGNOSIS — I2510 Atherosclerotic heart disease of native coronary artery without angina pectoris: Secondary | ICD-10-CM | POA: Diagnosis not present

## 2017-07-14 DIAGNOSIS — E785 Hyperlipidemia, unspecified: Secondary | ICD-10-CM | POA: Diagnosis not present

## 2017-07-14 DIAGNOSIS — R32 Unspecified urinary incontinence: Secondary | ICD-10-CM

## 2017-07-14 NOTE — Patient Instructions (Signed)

## 2017-07-14 NOTE — Assessment & Plan Note (Signed)
This is a very pleasant 73 year old African-American female who presented for evaluation of normocytic, normochromic anemia.    She had a bone marrow biopsy and is here to discuss the results.  The patient was seen with Dr. Julien Nordmann.  Bone marrow biopsy results were discussed with the patient which did not show any concerning findings or myeloproliferative disorder.  We discussed with the patient that her anemia is likely due to her chronic kidney disease.  Her most recent GFR was approximately 30.  We discussed proceeding with treatment consisting of Aranesp every 3 weeks.  Recommend that the patient talk with her nephrologist regarding administration of this medication at their office.  If her nephrologist does not wish to administer this medication, we can certainly do so.  She was instructed to call us and let us know how she would like to proceed.  For now, the patient will be scheduled back on as-needed basis.  The patient was advised to call immediately if she has any concerning symptoms in the interval.  The patient voices understanding of the current disease status and treatment options and is in agreement with the current plan.  All questions were answered.  The patient is to call the clinic with any problems, questions, or concerns.  We can certainly see the patient much sooner if necessary.

## 2017-07-15 ENCOUNTER — Other Ambulatory Visit: Payer: Self-pay | Admitting: Oncology

## 2017-07-15 DIAGNOSIS — D638 Anemia in other chronic diseases classified elsewhere: Secondary | ICD-10-CM

## 2017-07-15 DIAGNOSIS — N189 Chronic kidney disease, unspecified: Secondary | ICD-10-CM

## 2017-07-15 NOTE — Progress Notes (Signed)
I spoke with her daughter and notified her that we will be scheduling her for lab and Aranesp to begin next week.  Scheduling message has been sent.  Orders Placed This Encounter  Procedures  . CBC with Differential (Cancer Center Only)    Standing Status:   Standing    Number of Occurrences:   20    Standing Expiration Date:   07/16/2018  . CBC with Differential (Cancer Center Only)    Standing Status:   Future    Standing Expiration Date:   07/16/2018  . CMP ([facility] only)    Standing Status:   Future    Standing Expiration Date:   07/16/2018  . Ferritin    Standing Status:   Future    Standing Expiration Date:   07/16/2018  . Iron and TIBC    Standing Status:   Future    Standing Expiration Date:   07/16/2018

## 2017-07-16 ENCOUNTER — Telehealth: Payer: Self-pay | Admitting: Oncology

## 2017-07-16 NOTE — Telephone Encounter (Signed)
Scheduled appt per 6/18 sch message - left message and sent reminder letter in the mail

## 2017-07-22 ENCOUNTER — Inpatient Hospital Stay: Payer: Medicare HMO

## 2017-07-22 VITALS — BP 122/60 | HR 72 | Temp 97.8°F | Resp 18

## 2017-07-22 DIAGNOSIS — E1122 Type 2 diabetes mellitus with diabetic chronic kidney disease: Secondary | ICD-10-CM | POA: Diagnosis not present

## 2017-07-22 DIAGNOSIS — D638 Anemia in other chronic diseases classified elsewhere: Secondary | ICD-10-CM

## 2017-07-22 DIAGNOSIS — N189 Chronic kidney disease, unspecified: Secondary | ICD-10-CM | POA: Diagnosis not present

## 2017-07-22 DIAGNOSIS — Z79899 Other long term (current) drug therapy: Secondary | ICD-10-CM | POA: Diagnosis not present

## 2017-07-22 DIAGNOSIS — F419 Anxiety disorder, unspecified: Secondary | ICD-10-CM | POA: Diagnosis not present

## 2017-07-22 DIAGNOSIS — D631 Anemia in chronic kidney disease: Secondary | ICD-10-CM | POA: Diagnosis not present

## 2017-07-22 DIAGNOSIS — I2510 Atherosclerotic heart disease of native coronary artery without angina pectoris: Secondary | ICD-10-CM | POA: Diagnosis not present

## 2017-07-22 DIAGNOSIS — J449 Chronic obstructive pulmonary disease, unspecified: Secondary | ICD-10-CM | POA: Diagnosis not present

## 2017-07-22 DIAGNOSIS — E785 Hyperlipidemia, unspecified: Secondary | ICD-10-CM | POA: Diagnosis not present

## 2017-07-22 DIAGNOSIS — I129 Hypertensive chronic kidney disease with stage 1 through stage 4 chronic kidney disease, or unspecified chronic kidney disease: Secondary | ICD-10-CM | POA: Diagnosis not present

## 2017-07-22 LAB — CBC WITH DIFFERENTIAL (CANCER CENTER ONLY)
Basophils Absolute: 0.1 10*3/uL (ref 0.0–0.1)
Basophils Relative: 1 %
Eosinophils Absolute: 0.1 10*3/uL (ref 0.0–0.5)
Eosinophils Relative: 2 %
HCT: 30.3 % — ABNORMAL LOW (ref 34.8–46.6)
Hemoglobin: 10 g/dL — ABNORMAL LOW (ref 11.6–15.9)
Lymphocytes Relative: 16 %
Lymphs Abs: 1.3 10*3/uL (ref 0.9–3.3)
MCH: 30.1 pg (ref 25.1–34.0)
MCHC: 33.1 g/dL (ref 31.5–36.0)
MCV: 91 fL (ref 79.5–101.0)
Monocytes Absolute: 0.6 10*3/uL (ref 0.1–0.9)
Monocytes Relative: 7 %
Neutro Abs: 6.3 10*3/uL (ref 1.5–6.5)
Neutrophils Relative %: 74 %
Platelet Count: 343 10*3/uL (ref 145–400)
RBC: 3.33 MIL/uL — ABNORMAL LOW (ref 3.70–5.45)
RDW: 16 % — ABNORMAL HIGH (ref 11.2–14.5)
WBC Count: 8.5 10*3/uL (ref 3.9–10.3)

## 2017-07-22 MED ORDER — DARBEPOETIN ALFA 300 MCG/0.6ML IJ SOSY
300.0000 ug | PREFILLED_SYRINGE | INTRAMUSCULAR | Status: DC
  Administered 2017-07-22: 300 ug via SUBCUTANEOUS

## 2017-07-22 MED ORDER — DARBEPOETIN ALFA 300 MCG/0.6ML IJ SOSY
PREFILLED_SYRINGE | INTRAMUSCULAR | Status: AC
  Filled 2017-07-22: qty 0.6

## 2017-07-25 ENCOUNTER — Encounter: Payer: Self-pay | Admitting: Podiatry

## 2017-07-25 ENCOUNTER — Ambulatory Visit (INDEPENDENT_AMBULATORY_CARE_PROVIDER_SITE_OTHER): Payer: Medicare HMO | Admitting: Podiatry

## 2017-07-25 DIAGNOSIS — M79674 Pain in right toe(s): Secondary | ICD-10-CM | POA: Diagnosis not present

## 2017-07-25 DIAGNOSIS — B351 Tinea unguium: Secondary | ICD-10-CM | POA: Diagnosis not present

## 2017-07-25 DIAGNOSIS — Z9229 Personal history of other drug therapy: Secondary | ICD-10-CM

## 2017-07-25 DIAGNOSIS — M79675 Pain in left toe(s): Secondary | ICD-10-CM | POA: Diagnosis not present

## 2017-07-26 ENCOUNTER — Encounter: Payer: Self-pay | Admitting: Podiatry

## 2017-07-26 NOTE — Progress Notes (Signed)
Subjective: Courtney Goodman is a 72 yo female who presents today with diabetes and cc of painful, discolored, thick toenails which interfere with activities of daily living. Pain is aggravated when wearing enclosed shoe gear. Pain is relieved with periodic professional debridement.  Objective: There were no vitals filed for this visit.   Vascular Examination: Capillary refill time <3 seconds x 10 digits Dorsalis pedis and posterior tibial pulses present b/l No digital hair x 10 digits Skin temperature warm to warm b/l  Dermatological Examination: Skin with normal turgor, texture and tone b/l Toenails 1-5 b/l discolored, thick, dystrophic with subungual debris and pain with palpation to nailbeds due to thickness of nails.  Musculoskeletal: Muscle strength 5/5 to all LE muscle groups  Neurological: Sensation intact with 10 gram monofilament. Vibratory sensation intact.  Assessment: Painful onychomycosis toenails 1-5 b/l  Long term use of blood thinner  Plan: 1. Toenails 1-5 b/l were debrided in length and girth without iatrogenic bleeding. 2. Patient to continue soft, supportive shoe gear 3. Patient to report any pedal injuries to medical professional immediately. 4. Follow up 3 months.  5. Patient/POA to call should there be a concern in the interim.

## 2017-07-31 ENCOUNTER — Other Ambulatory Visit: Payer: Self-pay | Admitting: Family Medicine

## 2017-08-01 DIAGNOSIS — F329 Major depressive disorder, single episode, unspecified: Secondary | ICD-10-CM | POA: Diagnosis not present

## 2017-08-01 DIAGNOSIS — E1129 Type 2 diabetes mellitus with other diabetic kidney complication: Secondary | ICD-10-CM | POA: Diagnosis not present

## 2017-08-01 DIAGNOSIS — N183 Chronic kidney disease, stage 3 (moderate): Secondary | ICD-10-CM | POA: Diagnosis not present

## 2017-08-01 DIAGNOSIS — D649 Anemia, unspecified: Secondary | ICD-10-CM | POA: Diagnosis not present

## 2017-08-01 DIAGNOSIS — Z6830 Body mass index (BMI) 30.0-30.9, adult: Secondary | ICD-10-CM | POA: Diagnosis not present

## 2017-08-08 ENCOUNTER — Ambulatory Visit
Admission: RE | Admit: 2017-08-08 | Discharge: 2017-08-08 | Disposition: A | Discharge status: Home / Self Care | Payer: Medicare HMO | Source: Ambulatory Visit | Attending: Family Medicine | Admitting: Family Medicine

## 2017-08-08 DIAGNOSIS — Z1239 Encounter for other screening for malignant neoplasm of breast: Secondary | ICD-10-CM

## 2017-08-08 DIAGNOSIS — M85852 Other specified disorders of bone density and structure, left thigh: Secondary | ICD-10-CM | POA: Diagnosis not present

## 2017-08-08 DIAGNOSIS — M8580 Other specified disorders of bone density and structure, unspecified site: Secondary | ICD-10-CM

## 2017-08-08 DIAGNOSIS — Z1231 Encounter for screening mammogram for malignant neoplasm of breast: Secondary | ICD-10-CM | POA: Diagnosis not present

## 2017-08-08 DIAGNOSIS — Z780 Asymptomatic menopausal state: Secondary | ICD-10-CM | POA: Diagnosis not present

## 2017-08-08 DIAGNOSIS — E2839 Other primary ovarian failure: Secondary | ICD-10-CM

## 2017-08-11 ENCOUNTER — Other Ambulatory Visit: Payer: Self-pay | Admitting: Family Medicine

## 2017-08-11 DIAGNOSIS — R928 Other abnormal and inconclusive findings on diagnostic imaging of breast: Secondary | ICD-10-CM

## 2017-08-12 ENCOUNTER — Ambulatory Visit: Payer: Medicare HMO

## 2017-08-12 ENCOUNTER — Other Ambulatory Visit: Payer: Medicare HMO

## 2017-08-12 ENCOUNTER — Encounter: Payer: Self-pay | Admitting: Internal Medicine

## 2017-08-12 MED ORDER — EPOETIN ALFA 40000 UNIT/ML IJ SOLN
INTRAMUSCULAR | Status: AC
  Filled 2017-08-12: qty 1

## 2017-08-20 ENCOUNTER — Other Ambulatory Visit: Payer: Self-pay | Admitting: Endocrinology

## 2017-08-22 ENCOUNTER — Observation Stay (HOSPITAL_COMMUNITY)
Admission: EM | Admit: 2017-08-22 | Discharge: 2017-08-24 | Disposition: A | Discharge status: Home / Self Care | Payer: Medicare HMO | Attending: Internal Medicine | Admitting: Internal Medicine

## 2017-08-22 DIAGNOSIS — Z79899 Other long term (current) drug therapy: Secondary | ICD-10-CM | POA: Diagnosis not present

## 2017-08-22 DIAGNOSIS — N189 Chronic kidney disease, unspecified: Secondary | ICD-10-CM | POA: Insufficient documentation

## 2017-08-22 DIAGNOSIS — Z87891 Personal history of nicotine dependence: Secondary | ICD-10-CM | POA: Diagnosis not present

## 2017-08-22 DIAGNOSIS — R2689 Other abnormalities of gait and mobility: Secondary | ICD-10-CM | POA: Insufficient documentation

## 2017-08-22 DIAGNOSIS — E162 Hypoglycemia, unspecified: Secondary | ICD-10-CM | POA: Diagnosis not present

## 2017-08-22 DIAGNOSIS — I10 Essential (primary) hypertension: Secondary | ICD-10-CM | POA: Diagnosis present

## 2017-08-22 DIAGNOSIS — E876 Hypokalemia: Secondary | ICD-10-CM | POA: Diagnosis present

## 2017-08-22 DIAGNOSIS — N183 Chronic kidney disease, stage 3 unspecified: Secondary | ICD-10-CM | POA: Diagnosis present

## 2017-08-22 DIAGNOSIS — R410 Disorientation, unspecified: Secondary | ICD-10-CM | POA: Diagnosis present

## 2017-08-22 DIAGNOSIS — J449 Chronic obstructive pulmonary disease, unspecified: Secondary | ICD-10-CM | POA: Diagnosis present

## 2017-08-22 DIAGNOSIS — K219 Gastro-esophageal reflux disease without esophagitis: Secondary | ICD-10-CM | POA: Diagnosis present

## 2017-08-22 DIAGNOSIS — E1122 Type 2 diabetes mellitus with diabetic chronic kidney disease: Secondary | ICD-10-CM | POA: Diagnosis not present

## 2017-08-22 DIAGNOSIS — G9341 Metabolic encephalopathy: Secondary | ICD-10-CM | POA: Diagnosis present

## 2017-08-22 DIAGNOSIS — R4020 Unspecified coma: Secondary | ICD-10-CM | POA: Diagnosis not present

## 2017-08-22 DIAGNOSIS — N289 Disorder of kidney and ureter, unspecified: Secondary | ICD-10-CM

## 2017-08-22 DIAGNOSIS — E161 Other hypoglycemia: Secondary | ICD-10-CM | POA: Diagnosis not present

## 2017-08-22 DIAGNOSIS — E1169 Type 2 diabetes mellitus with other specified complication: Secondary | ICD-10-CM | POA: Diagnosis present

## 2017-08-22 DIAGNOSIS — I4510 Unspecified right bundle-branch block: Secondary | ICD-10-CM | POA: Diagnosis not present

## 2017-08-22 DIAGNOSIS — E1129 Type 2 diabetes mellitus with other diabetic kidney complication: Secondary | ICD-10-CM | POA: Diagnosis present

## 2017-08-22 DIAGNOSIS — I129 Hypertensive chronic kidney disease with stage 1 through stage 4 chronic kidney disease, or unspecified chronic kidney disease: Secondary | ICD-10-CM | POA: Diagnosis not present

## 2017-08-22 DIAGNOSIS — F339 Major depressive disorder, recurrent, unspecified: Secondary | ICD-10-CM | POA: Diagnosis present

## 2017-08-22 DIAGNOSIS — R58 Hemorrhage, not elsewhere classified: Secondary | ICD-10-CM | POA: Diagnosis not present

## 2017-08-22 DIAGNOSIS — E782 Mixed hyperlipidemia: Secondary | ICD-10-CM

## 2017-08-22 DIAGNOSIS — I2510 Atherosclerotic heart disease of native coronary artery without angina pectoris: Secondary | ICD-10-CM | POA: Diagnosis present

## 2017-08-22 DIAGNOSIS — E11649 Type 2 diabetes mellitus with hypoglycemia without coma: Secondary | ICD-10-CM | POA: Diagnosis not present

## 2017-08-22 LAB — CBG MONITORING, ED: Glucose-Capillary: 93 mg/dL (ref 70–99)

## 2017-08-22 NOTE — ED Triage Notes (Signed)
Pt. Came in by EMS from home with reports of confusion. Pt. Daughter said last seen normal was yesterday at noon. Pt. Reports taking her insulin and did not eat. Pt. Sugar on scene was 37. Pt. Was given 50 cc of D10. Pt. Is now A/0 X4 and no longer confused. Pt. Does not remember incident. Current CBG 93

## 2017-08-22 NOTE — ED Provider Notes (Signed)
[facility] EMERGENCY DEPARTMENT Provider Note   CSN: [id number] Arrival date & time:        History   Chief Complaint No chief complaint on file.   HPI Courtney Goodman is a 72 y.o. female.  The history is provided by the patient.  She has history of hypertension, diabetes, hyperlipidemia, COPD, chronic kidney disease and comes in because of an episode of confusion.  She states that she had not been eating well today.  She went to a store when she started to feel confused.  She denies chest pain, heaviness, tightness, pressure, but she was sweaty.  There is no nausea or vomiting or dyspnea.  She feels like she is back to her baseline now.  She has not checked her blood glucose today.  EMS reports glucose of 37 which was treated with intravenous dextrose.  Glucose is come up to 93.  Past Medical History:  Diagnosis Date  . Anemia of chronic disease   . Asthma   . Atherosclerotic heart disease of native coronary artery without angina pectoris   . CKD (chronic kidney disease)   . COPD (chronic obstructive pulmonary disease) ([place])   . DM (diabetes mellitus), type 2 with renal complications ([place])   . Dysrhythmia   . Elevated ferritin 01/03/2017  . GAD (generalized anxiety disorder)   . Hypertension   . Mixed hyperlipidemia due to type 2 diabetes mellitus ([place])   . Mixed incontinence    per medical records from [place]  . Osteopenia   . Personal history of noncompliance with medical treatment, presenting hazards to health   . Shortness of breath dyspnea   . Suicidal ideation   . TB (tuberculosis), treated    age 23  . Vitamin D deficiency     Patient Active Problem List   Diagnosis Date Noted  . Elevated ferritin 01/03/2017  . Personal history of noncompliance with medical treatment, presenting hazards to health   . Urinary incontinence, mixed 05/27/2016  . Major depression, recurrent ([place]) 05/27/2016  . Atherosclerotic heart disease of native coronary  artery without angina pectoris 05/27/2016  . GAD (generalized anxiety disorder)   . Anemia of chronic disease   . Mixed hyperlipidemia due to type 2 diabetes mellitus ([place])   . Diabetes ([place]) 11/30/2015  . Vitamin D deficiency 11/23/2015  . HTN (hypertension) 11/23/2015  . Anemia 11/23/2015  . CKD (chronic kidney disease) 11/23/2015  . Dysrhythmia   . COPD (chronic obstructive pulmonary disease) ([place])     Past Surgical History:  Procedure Laterality Date  . ANKLE RECONSTRUCTION     right  . CARPAL TUNNEL RELEASE    . CORONARY ANGIOPLASTY       OB History   None      Home Medications    Prior to Admission medications   Medication Sig Start Date End Date Taking? Authorizing Provider  albuterol (PROVENTIL HFA;VENTOLIN HFA) 108 (90 Base) MCG/ACT inhaler Inhale 2 puffs into the lungs every 6 (six) hours as needed for wheezing or shortness of breath. 04/14/17   Henson, Vickie L, NP-C  amLODipine-benazepril (LOTREL) 5-20 MG capsule Take 1 capsule by mouth daily. 07/01/17   Henson, Vickie L, NP-C  aspirin EC 81 MG tablet Take 81 mg by mouth every morning.     [provider]  Biotin 10000 MCG TABS Take 1 tablet by mouth every morning.    [provider]  Blood Glucose Monitoring Suppl (ACCU-CHEK AVIVA PLUS) w/Device KIT Test twice a  day. Dx code E11.9 07/25/16   [name] L, NP-C  carvedilol (COREG) 6.25 MG tablet Take 1 tablet by mouth twice daily. 06/02/17   Henson, Vickie L, NP-C  cholecalciferol (VITAMIN D) 1000 units tablet Take 1 tablet by mouth daily. 05/05/17   Henson, Vickie L, NP-C  citalopram (CELEXA) 20 MG tablet Take 1 tablet (20 mg total) by mouth daily. 09/05/16   Henson, Vickie L, NP-C  fenofibrate (TRICOR) 145 MG tablet Take 1 tablet (145 mg total) by mouth daily. 08/01/17   Henson, Vickie L, NP-C  fluticasone (FLONASE) 50 MCG/ACT nasal spray Place 2 sprays into both nostrils daily. 07/25/16   Henson, Vickie L, NP-C  folic acid (FOLVITE) 1 MG tablet Take 1  tablet (1 mg total) by mouth daily. 07/01/17   Henson, Vickie L, NP-C  LEVEMIR FLEXTOUCH 100 UNIT/ML Pen Inject 160 Units into the skin every morning. 08/20/17   Renato Shin, MD  liraglutide (VICTOZA) 18 MG/3ML SOPN Inject 1.8 mg subcutaneously daily. 06/28/17   Renato Shin, MD  omeprazole (PRILOSEC) 40 MG capsule Take 1 capsule (40 mg total) by mouth daily. 07/01/17   Henson, Vickie L, NP-C  simvastatin (ZOCOR) 20 MG tablet Take 1 tablet by mouth every night. 06/02/17   Henson, Vickie L, NP-C  traMADol (ULTRAM) 50 MG tablet Take 1 tablet by mouth daily as needed. 04/03/17   Henson, Vickie L, NP-C  traZODone (DESYREL) 50 MG tablet Take 50 mg by mouth at bedtime. Patient gets this from her therapist, not PCP    [provider]  TRUE METRIX BLOOD GLUCOSE TEST test strip USE AS DIRECTED THREE TIMES A DAY 08/30/16   Renato Shin, MD    Family History Family History  Problem Relation Age of Onset  . Diabetes Mother     Social History Social History   Tobacco Use  . Smoking status: Former Research scientist (life sciences)  . Smokeless tobacco: Never Used  Substance Use Topics  . Alcohol use: No  . Drug use: No     Allergies   Patient has no known allergies.   Review of Systems Review of Systems  All other systems reviewed and are negative.    Physical Exam Updated Vital Signs BP 101/82   Pulse 66   Resp 12   Ht 5' 4"  (1.626 m)   Wt 77.1 kg (170 lb)   SpO2 100%   BMI 29.18 kg/m   Physical Exam  Nursing note and vitals reviewed.  72 year old female, resting comfortably and in no acute distress. Vital signs are normal. Oxygen saturation is 100%, which is normal. Head is normocephalic and atraumatic. PERRLA, EOMI. Oropharynx is clear. Neck is nontender and supple without adenopathy or JVD. Back is nontender and there is no CVA tenderness. Lungs are clear without rales, wheezes, or rhonchi. Chest is nontender. Heart has regular rate and rhythm without murmur. Abdomen is soft, flat, nontender  without masses or hepatosplenomegaly and peristalsis is normoactive. Extremities have trace edema, full range of motion is present. Skin is warm and dry without rash. Neurologic: Mental status is normal, cranial nerves are intact, there are no motor or sensory deficits.  ED Treatments / Results  Labs (all labs ordered are listed, but only abnormal results are displayed) Labs Reviewed  COMPREHENSIVE METABOLIC PANEL - Abnormal; Notable for the following components:      Result Value   Potassium 3.3 (*)    Glucose, Bld 47 (*)    BUN 28 (*)    Creatinine, Ser  1.57 (*)    GFR calc non Af Amer 32 (*)    GFR calc Af Amer 37 (*)    All other components within normal limits  URINALYSIS, ROUTINE W REFLEX MICROSCOPIC - Abnormal; Notable for the following components:   APPearance HAZY (*)    Hgb urine dipstick SMALL (*)    Leukocytes, UA TRACE (*)    Bacteria, UA RARE (*)    All other components within normal limits  CBG MONITORING, ED - Abnormal; Notable for the following components:   Glucose-Capillary 37 (*)    All other components within normal limits  CBG MONITORING, ED - Abnormal; Notable for the following components:   Glucose-Capillary 217 (*)    All other components within normal limits  CBC WITH DIFFERENTIAL/PLATELET  CBG MONITORING, ED  I-STAT TROPONIN, ED    EKG EKG Interpretation  Date/Time:  Saturday August 23 2017 00:05:27 EDT Ventricular Rate:  66 PR Interval:    QRS Duration: 146 QT Interval:  464 QTC Calculation: 487 R Axis:   50 Text Interpretation:  Sinus rhythm Right bundle branch block When compared with ECG of 03/25/2015, QT has shortened Confirmed by Delora Fuel ([id number]) on 08/23/2017 12:10:00 AM   Radiology Dg Chest 2 View  Result Date: 08/23/2017 CLINICAL DATA:  Confusion.  Hypoglycemia. EXAM: CHEST - 2 VIEW COMPARISON:  04/24/2017 FINDINGS: Normal heart size and pulmonary vascularity. Linear fibrosis in the left lung base. Lungs are otherwise clear and  expanded. No blunting of costophrenic angles. No pneumothorax. Mediastinal contours appear intact. Calcification of the aorta. Degenerative changes in the spine. IMPRESSION: No active cardiopulmonary disease. Electronically Signed   By: Lucienne Capers M.D.   On: 08/23/2017 00:40    Procedures Procedures  CRITICAL CARE Performed by: Delora Fuel Total critical care time: 40 minutes Critical care time was exclusive of separately billable procedures and treating other patients. Critical care was necessary to treat or prevent imminent or life-threatening deterioration. Critical care was time spent personally by me on the following activities: development of treatment plan with patient and/or surrogate as well as nursing, discussions with consultants, evaluation of patient's response to treatment, examination of patient, obtaining history from patient or surrogate, ordering and performing treatments and interventions, ordering and review of laboratory studies, ordering and review of radiographic studies, pulse oximetry and re-evaluation of patient's condition.  Medications Ordered in ED Medications  dextrose 10 % infusion ( Intravenous New Bag/Given 08/23/17 0200)  dextrose 50 % solution 50 mL (50 mLs Intravenous Given 08/23/17 0155)     Initial Impression / Assessment and Plan / ED Course  I have reviewed the triage vital signs and the nursing notes.  Pertinent labs & imaging results that were available during my care of the patient were reviewed by me and considered in my medical decision making (see chart for details).  Apparent hypoglycemic episode.  Old records are reviewed, and it is noted that she has had some difficulty with compliance with her diabetes regimen.  One prior ED visit for hyperglycemia, no ED visits for hypoglycemia.  Repeat glucose is come back at 34.  Patient will need to be maintained on glucose infusion.  She is given additional intravenous dextrose and started on a  dextrose infusion.  Case is discussed with Dr. Blaine Hamper of [facility], who agrees to admit the patient.  Final Clinical Impressions(s) / ED Diagnoses   Final diagnoses:  Hypoglycemia  Renal insufficiency    ED Discharge Orders    None  Delora Fuel, MD 98/92/11 [phone]

## 2017-08-23 ENCOUNTER — Emergency Department (HOSPITAL_COMMUNITY): Payer: Medicare HMO

## 2017-08-23 ENCOUNTER — Encounter (HOSPITAL_COMMUNITY): Payer: Self-pay

## 2017-08-23 ENCOUNTER — Other Ambulatory Visit: Payer: Self-pay

## 2017-08-23 DIAGNOSIS — E1129 Type 2 diabetes mellitus with other diabetic kidney complication: Secondary | ICD-10-CM | POA: Diagnosis present

## 2017-08-23 DIAGNOSIS — K219 Gastro-esophageal reflux disease without esophagitis: Secondary | ICD-10-CM | POA: Diagnosis not present

## 2017-08-23 DIAGNOSIS — G9341 Metabolic encephalopathy: Secondary | ICD-10-CM | POA: Diagnosis not present

## 2017-08-23 DIAGNOSIS — R410 Disorientation, unspecified: Secondary | ICD-10-CM | POA: Diagnosis not present

## 2017-08-23 DIAGNOSIS — I2510 Atherosclerotic heart disease of native coronary artery without angina pectoris: Secondary | ICD-10-CM | POA: Diagnosis present

## 2017-08-23 DIAGNOSIS — E162 Hypoglycemia, unspecified: Secondary | ICD-10-CM | POA: Diagnosis not present

## 2017-08-23 DIAGNOSIS — E876 Hypokalemia: Secondary | ICD-10-CM | POA: Diagnosis not present

## 2017-08-23 DIAGNOSIS — I10 Essential (primary) hypertension: Secondary | ICD-10-CM

## 2017-08-23 DIAGNOSIS — N183 Chronic kidney disease, stage 3 (moderate): Secondary | ICD-10-CM | POA: Diagnosis not present

## 2017-08-23 DIAGNOSIS — E782 Mixed hyperlipidemia: Secondary | ICD-10-CM | POA: Diagnosis not present

## 2017-08-23 DIAGNOSIS — E1169 Type 2 diabetes mellitus with other specified complication: Secondary | ICD-10-CM | POA: Diagnosis not present

## 2017-08-23 LAB — CBC WITH DIFFERENTIAL/PLATELET
Abs Immature Granulocytes: 0 10*3/uL (ref 0.0–0.1)
Basophils Absolute: 0 10*3/uL (ref 0.0–0.1)
Basophils Relative: 1 %
Eosinophils Absolute: 0.1 10*3/uL (ref 0.0–0.7)
Eosinophils Relative: 2 %
HCT: 38.3 % (ref 36.0–46.0)
Hemoglobin: 12.1 g/dL (ref 12.0–15.0)
Immature Granulocytes: 0 %
Lymphocytes Relative: 19 %
Lymphs Abs: 1.5 10*3/uL (ref 0.7–4.0)
MCH: 29.5 pg (ref 26.0–34.0)
MCHC: 31.6 g/dL (ref 30.0–36.0)
MCV: 93.4 fL (ref 78.0–100.0)
Monocytes Absolute: 0.5 10*3/uL (ref 0.1–1.0)
Monocytes Relative: 6 %
Neutro Abs: 5.9 10*3/uL (ref 1.7–7.7)
Neutrophils Relative %: 72 %
Platelets: 394 10*3/uL (ref 150–400)
RBC: 4.1 MIL/uL (ref 3.87–5.11)
RDW: 15 % (ref 11.5–15.5)
WBC: 8.1 10*3/uL (ref 4.0–10.5)

## 2017-08-23 LAB — GLUCOSE, CAPILLARY
Glucose-Capillary: 314 mg/dL — ABNORMAL HIGH (ref 70–99)
Glucose-Capillary: 335 mg/dL — ABNORMAL HIGH (ref 70–99)
Glucose-Capillary: 233 mg/dL — ABNORMAL HIGH (ref 70–99)
Glucose-Capillary: 213 mg/dL — ABNORMAL HIGH (ref 70–99)
Glucose-Capillary: 220 mg/dL — ABNORMAL HIGH (ref 70–99)

## 2017-08-23 LAB — URINALYSIS, ROUTINE W REFLEX MICROSCOPIC
Bilirubin Urine: NEGATIVE
Glucose, UA: NEGATIVE mg/dL
Ketones, ur: NEGATIVE mg/dL
Nitrite: NEGATIVE
Protein, ur: NEGATIVE mg/dL
Specific Gravity, Urine: 1.01 (ref 1.005–1.030)
pH: 6 (ref 5.0–8.0)

## 2017-08-23 LAB — CBC
HCT: 39.5 % (ref 36.0–46.0)
Hemoglobin: 12.5 g/dL (ref 12.0–15.0)
MCH: 29.4 pg (ref 26.0–34.0)
MCHC: 31.6 g/dL (ref 30.0–36.0)
MCV: 92.9 fL (ref 78.0–100.0)
Platelets: 412 10*3/uL — ABNORMAL HIGH (ref 150–400)
RBC: 4.25 MIL/uL (ref 3.87–5.11)
RDW: 14.9 % (ref 11.5–15.5)
WBC: 8.4 10*3/uL (ref 4.0–10.5)

## 2017-08-23 LAB — COMPREHENSIVE METABOLIC PANEL
ALT: 14 U/L (ref 0–44)
AST: 24 U/L (ref 15–41)
Albumin: 3.6 g/dL (ref 3.5–5.0)
Alkaline Phosphatase: 45 U/L (ref 38–126)
Anion gap: 9 (ref 5–15)
BUN: 28 mg/dL — ABNORMAL HIGH (ref 8–23)
CO2: 27 mmol/L (ref 22–32)
Calcium: 9.8 mg/dL (ref 8.9–10.3)
Chloride: 105 mmol/L (ref 98–111)
Creatinine, Ser: 1.57 mg/dL — ABNORMAL HIGH (ref 0.44–1.00)
GFR calc Af Amer: 37 mL/min — ABNORMAL LOW (ref >60)
GFR calc non Af Amer: 32 mL/min — ABNORMAL LOW (ref >60)
Glucose, Bld: 47 mg/dL — ABNORMAL LOW (ref 70–99)
Potassium: 3.3 mmol/L — ABNORMAL LOW (ref 3.5–5.1)
Sodium: 141 mmol/L (ref 135–145)
Total Bilirubin: 0.7 mg/dL (ref 0.3–1.2)
Total Protein: 7.8 g/dL (ref 6.5–8.1)

## 2017-08-23 LAB — BASIC METABOLIC PANEL WITH GFR
Anion gap: 9 (ref 5–15)
BUN: 25 mg/dL — ABNORMAL HIGH (ref 8–23)
CO2: 29 mmol/L (ref 22–32)
Calcium: 9.8 mg/dL (ref 8.9–10.3)
Chloride: 100 mmol/L (ref 98–111)
Creatinine, Ser: 1.49 mg/dL — ABNORMAL HIGH (ref 0.44–1.00)
GFR calc Af Amer: 39 mL/min — ABNORMAL LOW
GFR calc non Af Amer: 34 mL/min — ABNORMAL LOW
Glucose, Bld: 313 mg/dL — ABNORMAL HIGH (ref 70–99)
Potassium: 3.8 mmol/L (ref 3.5–5.1)
Sodium: 138 mmol/L (ref 135–145)

## 2017-08-23 LAB — CBG MONITORING, ED
Glucose-Capillary: 217 mg/dL — ABNORMAL HIGH (ref 70–99)
Glucose-Capillary: 37 mg/dL — CL (ref 70–99)
Glucose-Capillary: 256 mg/dL — ABNORMAL HIGH (ref 70–99)

## 2017-08-23 LAB — I-STAT TROPONIN, ED: Troponin i, poc: 0 ng/mL (ref 0.00–0.08)

## 2017-08-23 MED ORDER — BENAZEPRIL HCL 20 MG PO TABS
20.0000 mg | ORAL_TABLET | Freq: Every day | ORAL | Status: DC
  Administered 2017-08-23: 20 mg via ORAL
  Filled 2017-08-23: qty 1

## 2017-08-23 MED ORDER — INSULIN ASPART 100 UNIT/ML ~~LOC~~ SOLN
0.0000 [IU] | Freq: Every day | SUBCUTANEOUS | Status: DC
  Administered 2017-08-23: 2 [IU] via SUBCUTANEOUS

## 2017-08-23 MED ORDER — SENNOSIDES-DOCUSATE SODIUM 8.6-50 MG PO TABS: 1.0000 | ORAL_TABLET | Freq: Every evening | ORAL | Status: DC | PRN

## 2017-08-23 MED ORDER — INSULIN ASPART 100 UNIT/ML ~~LOC~~ SOLN
3.0000 [IU] | Freq: Three times a day (TID) | SUBCUTANEOUS | Status: DC
  Administered 2017-08-23 – 2017-08-24 (×3): 3 [IU] via SUBCUTANEOUS

## 2017-08-23 MED ORDER — ACETAMINOPHEN 325 MG PO TABS: 650.0000 mg | ORAL_TABLET | Freq: Four times a day (QID) | ORAL | Status: DC | PRN

## 2017-08-23 MED ORDER — HYDRALAZINE HCL 20 MG/ML IJ SOLN
5.0000 mg | INTRAMUSCULAR | Status: DC | PRN
Start: 2017-08-23 — End: 2017-08-24

## 2017-08-23 MED ORDER — DEXTROSE 50 % IV SOLN
50.0000 mL | Freq: Once | INTRAVENOUS | Status: AC
  Administered 2017-08-23: 50 mL via INTRAVENOUS

## 2017-08-23 MED ORDER — DEXTROSE 10 % IV SOLN
INTRAVENOUS | Status: DC
  Administered 2017-08-23: 02:00:00 via INTRAVENOUS

## 2017-08-23 MED ORDER — CARVEDILOL 12.5 MG PO TABS
6.2500 mg | ORAL_TABLET | Freq: Two times a day (BID) | ORAL | Status: DC
  Administered 2017-08-23 – 2017-08-24 (×3): 6.25 mg via ORAL
  Filled 2017-08-23 (×3): qty 1

## 2017-08-23 MED ORDER — ZOLPIDEM TARTRATE 5 MG PO TABS: 5.0000 mg | ORAL_TABLET | Freq: Every evening | ORAL | Status: DC | PRN

## 2017-08-23 MED ORDER — CITALOPRAM HYDROBROMIDE 10 MG PO TABS
20.0000 mg | ORAL_TABLET | Freq: Every day | ORAL | Status: DC
  Administered 2017-08-23 – 2017-08-24 (×2): 20 mg via ORAL
  Filled 2017-08-23 (×2): qty 2

## 2017-08-23 MED ORDER — DEXTROSE 50 % IV SOLN
INTRAVENOUS | Status: AC
  Filled 2017-08-23: qty 50

## 2017-08-23 MED ORDER — FOLIC ACID 1 MG PO TABS
1.0000 mg | ORAL_TABLET | Freq: Every day | ORAL | Status: DC
  Administered 2017-08-23 – 2017-08-24 (×2): 1 mg via ORAL
  Filled 2017-08-23 (×2): qty 1

## 2017-08-23 MED ORDER — ALBUTEROL SULFATE (2.5 MG/3ML) 0.083% IN NEBU
3.0000 mL | INHALATION_SOLUTION | Freq: Four times a day (QID) | RESPIRATORY_TRACT | Status: DC | PRN
  Administered 2017-08-23: 3 mL via RESPIRATORY_TRACT
  Filled 2017-08-23: qty 3

## 2017-08-23 MED ORDER — ACETAMINOPHEN 650 MG RE SUPP: 650.0000 mg | Freq: Four times a day (QID) | RECTAL | Status: DC | PRN

## 2017-08-23 MED ORDER — ENOXAPARIN SODIUM 40 MG/0.4ML ~~LOC~~ SOLN
40.0000 mg | SUBCUTANEOUS | Status: DC
  Administered 2017-08-23: 40 mg via SUBCUTANEOUS
  Filled 2017-08-23: qty 0.4

## 2017-08-23 MED ORDER — VITAMIN D 1000 UNITS PO TABS
1000.0000 [IU] | ORAL_TABLET | Freq: Every day | ORAL | Status: DC
  Administered 2017-08-23 – 2017-08-24 (×2): 1000 [IU] via ORAL
  Filled 2017-08-23 (×2): qty 1

## 2017-08-23 MED ORDER — ONDANSETRON HCL 4 MG PO TABS: 4.0000 mg | ORAL_TABLET | Freq: Four times a day (QID) | ORAL | Status: DC | PRN

## 2017-08-23 MED ORDER — TRAZODONE HCL 50 MG PO TABS
50.0000 mg | ORAL_TABLET | Freq: Every day | ORAL | Status: DC
  Administered 2017-08-23: 50 mg via ORAL
  Filled 2017-08-23: qty 1

## 2017-08-23 MED ORDER — POTASSIUM CHLORIDE 20 MEQ/15ML (10%) PO SOLN
20.0000 meq | Freq: Once | ORAL | Status: AC
  Administered 2017-08-23: 20 meq via ORAL
  Filled 2017-08-23: qty 15

## 2017-08-23 MED ORDER — ASPIRIN EC 81 MG PO TBEC
81.0000 mg | DELAYED_RELEASE_TABLET | Freq: Every day | ORAL | Status: DC
  Administered 2017-08-23 – 2017-08-24 (×2): 81 mg via ORAL
  Filled 2017-08-23 (×2): qty 1

## 2017-08-23 MED ORDER — ONDANSETRON HCL 4 MG/2ML IJ SOLN: 4.0000 mg | Freq: Four times a day (QID) | INTRAMUSCULAR | Status: DC | PRN

## 2017-08-23 MED ORDER — FENOFIBRATE 160 MG PO TABS
160.0000 mg | ORAL_TABLET | Freq: Every day | ORAL | Status: DC
  Administered 2017-08-23 – 2017-08-24 (×2): 160 mg via ORAL
  Filled 2017-08-23 (×2): qty 1

## 2017-08-23 MED ORDER — INSULIN ASPART 100 UNIT/ML ~~LOC~~ SOLN
0.0000 [IU] | Freq: Three times a day (TID) | SUBCUTANEOUS | Status: DC
  Administered 2017-08-23 (×2): 3 [IU] via SUBCUTANEOUS
  Administered 2017-08-23: 7 [IU] via SUBCUTANEOUS
  Administered 2017-08-24: 1 [IU] via SUBCUTANEOUS

## 2017-08-23 MED ORDER — FLUTICASONE PROPIONATE 50 MCG/ACT NA SUSP
2.0000 | Freq: Every day | NASAL | Status: DC
  Filled 2017-08-23: qty 16

## 2017-08-23 MED ORDER — DEXTROSE 50 % IV SOLN: 50.0000 mL | INTRAVENOUS | Status: DC | PRN

## 2017-08-23 MED ORDER — PANTOPRAZOLE SODIUM 40 MG PO TBEC
40.0000 mg | DELAYED_RELEASE_TABLET | Freq: Every day | ORAL | Status: DC
  Administered 2017-08-23 – 2017-08-24 (×2): 40 mg via ORAL
  Filled 2017-08-23 (×2): qty 1

## 2017-08-23 MED ORDER — TRAMADOL HCL 50 MG PO TABS: 50.0000 mg | ORAL_TABLET | Freq: Every day | ORAL | Status: DC | PRN

## 2017-08-23 MED ORDER — SIMVASTATIN 20 MG PO TABS
20.0000 mg | ORAL_TABLET | Freq: Every day | ORAL | Status: DC
  Administered 2017-08-23: 20 mg via ORAL
  Filled 2017-08-23: qty 1

## 2017-08-23 MED ORDER — AMLODIPINE BESY-BENAZEPRIL HCL 5-20 MG PO CAPS: 1.0000 | ORAL_CAPSULE | Freq: Every day | ORAL | Status: DC

## 2017-08-23 MED ORDER — AMLODIPINE BESYLATE 5 MG PO TABS
5.0000 mg | ORAL_TABLET | Freq: Every day | ORAL | Status: DC
  Administered 2017-08-23 – 2017-08-24 (×2): 5 mg via ORAL
  Filled 2017-08-23 (×2): qty 1

## 2017-08-23 MED ORDER — BIOTIN 10000 MCG PO TABS: 1.0000 | ORAL_TABLET | Freq: Every day | ORAL | Status: DC

## 2017-08-23 MED ORDER — INSULIN DETEMIR 100 UNIT/ML ~~LOC~~ SOLN
15.0000 [IU] | Freq: Every day | SUBCUTANEOUS | Status: DC
  Administered 2017-08-23 – 2017-08-24 (×2): 15 [IU] via SUBCUTANEOUS
  Filled 2017-08-23 (×3): qty 0.15

## 2017-08-23 NOTE — Progress Notes (Addendum)
Inpatient Diabetes Program Recommendations  AACE/ADA: New Consensus Statement on Inpatient Glycemic Control (2015)  Target Ranges:  Prepandial:   less than 140 mg/dL      Peak postprandial:   less than 180 mg/dL (1-2 hours)      Critically ill patients:  140 - 180 mg/dL   Results for SREEJA, SPIES (MRN [id number]) as of 08/23/2017 10:25  Ref. Range 08/23/2017 02:18 08/23/2017 03:22 08/23/2017 06:14 08/23/2017 09:47  Glucose-Capillary Latest Ref Range: 70 - 99 mg/dL 217 (H) 256 (H) 314 (H) 335 (H)   Review of Glycemic Control  Diabetes history: DM 2 Outpatient Diabetes medications: Levemir 160 units Daily, Victoza 1.8 mg Daily Current orders for Inpatient glycemic control: Novolog 0-9 units tid, Novolog 0-5 units qhs, Novolog 3 units tid meal coverage  Inpatient Diabetes Program Recommendations:    Renal function slightly elevated. Patient sees Dr. Loanne Drilling, Endocrinology as an outpatient. The last 2 visits from February and June of this year he has decreased Levemir doses both time. Would recommend weight based dosing at this point and have patient/family to call Dr. Cordelia Pen office for a follow up appointment in the next 1-2 weeks as he knows patient best and will follow her closely.  Consider Levemir 15-20 (0.2units/kg) units Daily now. Resume Victoza as well outpatient. Noted poor po intake at home in addition to elevated renal function aided in the hypoglycemia. Victoza can also cause weight loss which also may be a factor.  Thanks,  Tama Headings RN, MSN, BC-ADM Inpatient Diabetes Coordinator Team Pager [phone] (8a-5p)

## 2017-08-23 NOTE — H&P (Signed)
History and Physical    Courtney Goodman NIO:[id number] DOB: July 17, 1945 DOA: 08/22/2017  Referring MD/NP/PA:   PCP: [name], NP-C   Patient coming from:  The patient is coming from home.  At baseline, pt is independent for most of ADL  Chief Complaint: AMS  HPI: Courtney Goodman is a 72 y.o. female with medical history significant of asthma, GERD, depression, anxiety, CAD, CKD 3, who presents with altered mental status.  Per patient's daughter, patient did not eat food, but continued insulin injection.  She developed altered mental status, very confused.  Patient does not have unilateral weakness, numbness or tingling his extremities.  No facial droop or slurred speech.  She moves all extremities normally.  Her daughter checked her blood sugar which was 37 at home. Pt was started with D10 in Ed, her mental status gradually improved.  When I saw patient in ED,  her mental status, back to normal.  She is alert, oriented x3.  Patient denies any chest pain, shortness of breath, cough, fever or chills.  She has nausea, but no vomiting, diarrhea or abdominal pain.  No symptoms of UTI.   ED Course: pt was found to have   Review of Systems:   General: no fevers, chills, no body weight gain, has poor appetite, has fatigue HEENT: no blurry vision, hearing changes or sore throat Respiratory: no dyspnea, coughing, wheezing CV: no chest pain, no palpitations GI: no nausea, vomiting, abdominal pain, diarrhea, constipation GU: no dysuria, burning on urination, increased urinary frequency, hematuria  Ext: no leg edema Neuro: no unilateral weakness, numbness, or tingling, no vision change or hearing loss Skin: no rash, no skin tear. MSK: No muscle spasm, no deformity, no limitation of range of movement in spin Heme: No easy bruising.  Travel history: No recent long distant travel.  Allergy: No Known Allergies  Past Medical History:  Diagnosis Date  . Anemia of chronic disease   . Asthma    . Atherosclerotic heart disease of native coronary artery without angina pectoris   . CKD (chronic kidney disease)   . COPD (chronic obstructive pulmonary disease) ([place])   . DM (diabetes mellitus), type 2 with renal complications ([place])   . Dysrhythmia   . Elevated ferritin 01/03/2017  . GAD (generalized anxiety disorder)   . Hypertension   . Mixed hyperlipidemia due to type 2 diabetes mellitus ([place])   . Mixed incontinence    per medical records from [place]  . Osteopenia   . Personal history of noncompliance with medical treatment, presenting hazards to health   . Shortness of breath dyspnea   . Suicidal ideation   . TB (tuberculosis), treated    age 24  . Vitamin D deficiency     Past Surgical History:  Procedure Laterality Date  . ANKLE RECONSTRUCTION     right  . CARPAL TUNNEL RELEASE    . CORONARY ANGIOPLASTY      Social History:  reports that she has quit smoking. She has never used smokeless tobacco. She reports that she does not drink alcohol or use drugs.  Family History:  Family History  Problem Relation Age of Onset  . Diabetes Mother      Prior to Admission medications   Medication Sig Start Date End Date Taking? Authorizing Provider  albuterol (PROVENTIL HFA;VENTOLIN HFA) 108 (90 Base) MCG/ACT inhaler Inhale 2 puffs into the lungs every 6 (six) hours as needed for wheezing or shortness of breath. 04/14/17   Henson, Laurian Brim,  NP-C  amLODipine-benazepril (LOTREL) 5-20 MG capsule Take 1 capsule by mouth daily. 07/01/17   Henson, Vickie L, NP-C  aspirin EC 81 MG tablet Take 81 mg by mouth every morning.     [provider]  Biotin 10000 MCG TABS Take 1 tablet by mouth every morning.    [provider]  Blood Glucose Monitoring Suppl (ACCU-CHEK AVIVA PLUS) w/Device KIT Test twice a day. Dx code E11.9 07/25/16   [name] L, NP-C  carvedilol (COREG) 6.25 MG tablet Take 1 tablet by mouth twice daily. 06/02/17   Henson, Vickie L, NP-C  cholecalciferol  (VITAMIN D) 1000 units tablet Take 1 tablet by mouth daily. 05/05/17   Henson, Vickie L, NP-C  citalopram (CELEXA) 20 MG tablet Take 1 tablet (20 mg total) by mouth daily. 09/05/16   Henson, Vickie L, NP-C  fenofibrate (TRICOR) 145 MG tablet Take 1 tablet (145 mg total) by mouth daily. 08/01/17   Henson, Vickie L, NP-C  fluticasone (FLONASE) 50 MCG/ACT nasal spray Place 2 sprays into both nostrils daily. 07/25/16   Henson, Vickie L, NP-C  folic acid (FOLVITE) 1 MG tablet Take 1 tablet (1 mg total) by mouth daily. 07/01/17   Henson, Vickie L, NP-C  LEVEMIR FLEXTOUCH 100 UNIT/ML Pen Inject 160 Units into the skin every morning. 08/20/17   Renato Shin, MD  liraglutide (VICTOZA) 18 MG/3ML SOPN Inject 1.8 mg subcutaneously daily. 06/28/17   Renato Shin, MD  omeprazole (PRILOSEC) 40 MG capsule Take 1 capsule (40 mg total) by mouth daily. 07/01/17   Henson, Vickie L, NP-C  simvastatin (ZOCOR) 20 MG tablet Take 1 tablet by mouth every night. 06/02/17   Henson, Vickie L, NP-C  traMADol (ULTRAM) 50 MG tablet Take 1 tablet by mouth daily as needed. 04/03/17   Henson, Vickie L, NP-C  traZODone (DESYREL) 50 MG tablet Take 50 mg by mouth at bedtime. Patient gets this from her therapist, not PCP    [provider]  TRUE METRIX BLOOD GLUCOSE TEST test strip USE AS DIRECTED THREE TIMES A DAY 08/30/16   Renato Shin, MD    Physical Exam: Vitals:   08/23/17 0200 08/23/17 0230 08/23/17 0300 08/23/17 0401  BP: (!) 144/86 101/82 (!) 150/78 (!) 164/72  Pulse: 67 66 64 74  Resp: 12   16  Temp:    (!) 97.5 F (36.4 C)  TempSrc:    Oral  SpO2: 100% 100% 100% 99%  Weight:    78.7 kg (173 lb 8 oz)  Height:    _0  (1.626 m)   General: Not in acute distress HEENT:       Eyes: PERRL, EOMI, no scleral icterus.       ENT: No discharge from the ears and nose, no pharynx injection, no tonsillar enlargement.        Neck: No JVD, no bruit, no mass felt. Heme: No neck lymph node enlargement. Cardiac: S1/S2, RRR, No murmurs,  No gallops or rubs. Respiratory: Good air movement bilaterally. No rales, wheezing, rhonchi or rubs. GI: Soft, nondistended, nontender, no rebound pain, no organomegaly, BS present. GU: No hematuria Ext: No pitting leg edema bilaterally. 2+DP/PT pulse bilaterally. Musculoskeletal: No joint deformities, No joint redness or warmth, no limitation of ROM in spin. Skin: No rashes.  Neuro: Alert, oriented X3, cranial nerves II-XII grossly intact, moves all extremities normally. Muscle strength 5/5 in all extremities, sensation to light touch intact. Brachial reflex 2+ bilaterally. Knee reflex 1+ bilaterally. Negative Babinski's sign. Normal finger to  nose test. Psych: Patient is not psychotic, no suicidal or hemocidal ideation.  Labs on Admission: I have personally reviewed following labs and imaging studies  CBC: Recent Labs  Lab 08/23/17 0043 08/23/17 0443  WBC 8.1 8.4  NEUTROABS 5.9  --   HGB 12.1 12.5  HCT 38.3 39.5  MCV 93.4 92.9  PLT 394 295*   Basic Metabolic Panel: Recent Labs  Lab 08/23/17 0043 08/23/17 0443  NA 141 138  K 3.3* 3.8  CL 105 100  CO2 27 29  GLUCOSE 47* 313*  BUN 28* 25*  CREATININE 1.57* 1.49*  CALCIUM 9.8 9.8   GFR: Estimated Creatinine Clearance: 34.6 mL/min (A) (by C-G formula based on SCr of 1.49 mg/dL (H)). Liver Function Tests: Recent Labs  Lab 08/23/17 0043  AST 24  ALT 14  ALKPHOS 45  BILITOT 0.7  PROT 7.8  ALBUMIN 3.6   No results for input(s): LIPASE, AMYLASE in the last 168 hours. No results for input(s): AMMONIA in the last 168 hours. Coagulation Profile: No results for input(s): INR, PROTIME in the last 168 hours. Cardiac Enzymes: No results for input(s): CKTOTAL, CKMB, CKMBINDEX, TROPONINI in the last 168 hours. BNP (last 3 results) No results for input(s): PROBNP in the last 8760 hours. HbA1C: No results for input(s): HGBA1C in the last 72 hours. CBG: Recent Labs  Lab 08/22/17 2357 08/23/17 0149 08/23/17 0218  08/23/17 0322 08/23/17 0614  GLUCAP 93 37* 217* 256* 314*   Lipid Profile: No results for input(s): CHOL, HDL, LDLCALC, TRIG, CHOLHDL, LDLDIRECT in the last 72 hours. Thyroid Function Tests: No results for input(s): TSH, T4TOTAL, FREET4, T3FREE, THYROIDAB in the last 72 hours. Anemia Panel: No results for input(s): VITAMINB12, FOLATE, FERRITIN, TIBC, IRON, RETICCTPCT in the last 72 hours. Urine analysis:    Component Value Date/Time   COLORURINE YELLOW 08/23/2017 0130   APPEARANCEUR HAZY (A) 08/23/2017 0130   LABSPEC 1.010 08/23/2017 0130   PHURINE 6.0 08/23/2017 0130   GLUCOSEU NEGATIVE 08/23/2017 0130   HGBUR SMALL (A) 08/23/2017 0130   BILIRUBINUR NEGATIVE 08/23/2017 0130   BILIRUBINUR n 07/01/2016 1139   KETONESUR NEGATIVE 08/23/2017 0130   PROTEINUR NEGATIVE 08/23/2017 0130   UROBILINOGEN negative (A) 07/01/2016 1139   NITRITE NEGATIVE 08/23/2017 0130   LEUKOCYTESUR TRACE (A) 08/23/2017 0130   Sepsis Labs: _0 (procalcitonin:4,lacticidven:4) )No results found for this or any previous visit (from the past 240 hour(s)).   Radiological Exams on Admission: Dg Chest 2 View  Result Date: 08/23/2017 CLINICAL DATA:  Confusion.  Hypoglycemia. EXAM: CHEST - 2 VIEW COMPARISON:  04/24/2017 FINDINGS: Normal heart size and pulmonary vascularity. Linear fibrosis in the left lung base. Lungs are otherwise clear and expanded. No blunting of costophrenic angles. No pneumothorax. Mediastinal contours appear intact. Calcification of the aorta. Degenerative changes in the spine. IMPRESSION: No active cardiopulmonary disease. Electronically Signed   By: Lucienne Capers M.D.   On: 08/23/2017 00:40     EKG: Independently reviewed.  Sinus rhythm, QTC 487, right bundle blockade, RN evaluate progression hypertension, hyperlipidemia, diabetes mellitus, COPD,  Assessment/Plan Principal Problem:   Acute metabolic encephalopathy Active Problems:   HTN (hypertension)   CKD (chronic kidney  disease), stage III (HCC)   COPD (chronic obstructive pulmonary disease) (HCC)   Mixed hyperlipidemia due to type 2 diabetes mellitus (HCC)   Major depression, recurrent (HCC)   GERD (gastroesophageal reflux disease)   CAD (coronary artery disease)   Hypoglycemia   Hypokalemia   Type II diabetes mellitus with renal manifestations (  [place])   Acute metabolic encephalopathy due to hypoglycemia: Patient did not eat food, but continued insulin injection, developed hypoglycemia.  Mental status quickly improved with D10 drip AED.  Currently patient's mental status came back normal.  She is alert oriented x3.  -Placed on telemetry bed for observation -Hold insulin and Victoza -Frequent neuro check - D10 at 50 cc/h  Type II diabetes mellitus with renal manifestations ([place]): Last A1c 6.3 on 03/07/17, well controled. Patient is taking Victoza and Levemir at home -Hold home medications now  HTN (hypertension): -continue amlodipine, Coreg -IV hydralazine as needed  CKD (chronic kidney disease), stage III ([place]): Stable.  Baseline creatinine 1.5, her creatinine is 1.57, BUN 28. -Follow-up renal function by BMP  COPD (chronic obstructive pulmonary disease) ([place]): Stable. -Bronchodilators  Mixed hyperlipidemia due to type 2 diabetes mellitus (HCC); -Zocor and trico  Major depression, recurrent ([place]); -continue home medications:  GERD: -Protonix  CAD: no CP -ContinueAspirin, Coreg, Zocor, TriCor  Hypokalemia: k=3.3 -repleted.    DVT ppx:  SQ Lovenox Code Status: Full code Family Communication:   Yes, patient's daughter at bed side Disposition Plan:  Anticipate discharge back to previous home environment Consults called:  none Admission status: Obs / tele     Date of Service 08/23/2017    Ivor Costa [facility] Pager [phone]  If 7PM-7AM, please contact night-coverage [email or web address] Password [email or web address] 08/23/2017, 7:00 AM

## 2017-08-23 NOTE — Progress Notes (Signed)
PROGRESS NOTE  Courtney Goodman GEX:[id number] DOB: 1946-01-03 DOA: 08/22/2017 PCP: Courtney Rm, NP-C  HPI/Recap of past 24 hours: Courtney Goodman is a 72 y.o. female with medical history significant of asthma, GERD, depression, anxiety, CAD, CKD 3, who presents with altered mental status likely due to hypoglycemia. Pt has been having poor appetite and continued to take insulin. In the ED pt was started on D10 infusion, with significant improvement of her mental status.  Patient denied any complaints, just reported generalized weakness/fatigue.  Patient admitted for further management.  Today, patient reported feeling better, but still weak overall, fatigued with poor appetite.  Denies any new complaints.   Assessment/Plan: Principal Problem:   Acute metabolic encephalopathy Active Problems:   HTN (hypertension)   CKD (chronic kidney disease), stage III (HCC)   COPD (chronic obstructive pulmonary disease) (HCC)   Mixed hyperlipidemia due to type 2 diabetes mellitus ([place])   Major depression, recurrent (HCC)   GERD (gastroesophageal reflux disease)   CAD (coronary artery disease)   Hypoglycemia   Hypokalemia   Type II diabetes mellitus with renal manifestations ([place])   Acute metabolic encephalopathy Resolved Likely due to hypoglycemia, CBG was 37 on admission (patient with poor appetite, but continued insulin/Victoza) Status post D10, stopped as patient is hyperglycemic Afebrile, no leukocytosis UA negative for infection Chest x-ray unremarkable PT Monitor closely  Type 2 diabetes mellitus Controlled Last A1c 6.3 on 03/07/17 Victoza and Levemir 160 at home, will hold victoza Due to hyperglycemia will start 15 U levemir, SSI, novolog TID  Monitor closely  HTN Continue amlodipine, Coreg IV hydralazine as needed  CKD stage III Stable Baseline creatinine 1.5 Daily BMP  COPD Stable Bronchodilators  GERD Protonix  CAD/HLD Chest pain free ContinueAspirin,  Coreg, Zocor, TriCor  Major depression Continue home medications:      Code Status: Full  Family Communication: None at bedside  Disposition Plan: Home by 08/24/2017   Consultants:  None  Procedures:  None  Antimicrobials:  None  DVT prophylaxis: Lovenox   Objective: Vitals:   08/23/17 0401 08/23/17 0940 08/23/17 1116 08/23/17 1242  BP: (!) 164/72 (!) 154/69  121/72  Pulse: 74 74 75 73  Resp: 16 16 16 20   Temp: (!) 97.5 F (36.4 C) 98.2 F (36.8 C)  98.2 F (36.8 C)  TempSrc: Oral Oral  Oral  SpO2: 99% 100% 100% 100%  Weight: 78.7 kg (173 lb 8 oz)     Height: 5\' 4"  (1.626 m)       Intake/Output Summary (Last 24 hours) at 08/23/2017 1501 Last data filed at 08/23/2017 0600 Gross per 24 hour  Intake 333.24 ml  Output -  Net 333.24 ml   Filed Weights   08/23/17 0000 08/23/17 0401  Weight: 77.1 kg (170 lb) 78.7 kg (173 lb 8 oz)    Exam:   General: NAD  Cardiovascular: S1, S2 present  Respiratory: CTA B  Abdomen: Soft, nontender, nondistended, bowel sounds present  Musculoskeletal: No pedal edema bilaterally  Skin: Normal  Psychiatry: Normal mood   Data Reviewed: CBC: Recent Labs  Lab 08/23/17 0043 08/23/17 0443  WBC 8.1 8.4  NEUTROABS 5.9  --   HGB 12.1 12.5  HCT 38.3 39.5  MCV 93.4 92.9  PLT 394 010*   Basic Metabolic Panel: Recent Labs  Lab 08/23/17 0043 08/23/17 0443  NA 141 138  K 3.3* 3.8  CL 105 100  CO2 27 29  GLUCOSE 47* 313*  BUN 28* 25*  CREATININE  1.57* 1.49*  CALCIUM 9.8 9.8   GFR: Estimated Creatinine Clearance: 34.6 mL/min (A) (by C-G formula based on SCr of 1.49 mg/dL (H)). Liver Function Tests: Recent Labs  Lab 08/23/17 0043  AST 24  ALT 14  ALKPHOS 45  BILITOT 0.7  PROT 7.8  ALBUMIN 3.6   No results for input(s): LIPASE, AMYLASE in the last 168 hours. No results for input(s): AMMONIA in the last 168 hours. Coagulation Profile: No results for input(s): INR, PROTIME in the last 168  hours. Cardiac Enzymes: No results for input(s): CKTOTAL, CKMB, CKMBINDEX, TROPONINI in the last 168 hours. BNP (last 3 results) No results for input(s): PROBNP in the last 8760 hours. HbA1C: No results for input(s): HGBA1C in the last 72 hours. CBG: Recent Labs  Lab 08/23/17 0218 08/23/17 0322 08/23/17 0614 08/23/17 0947 08/23/17 1136  GLUCAP 217* 256* 314* 335* 233*   Lipid Profile: No results for input(s): CHOL, HDL, LDLCALC, TRIG, CHOLHDL, LDLDIRECT in the last 72 hours. Thyroid Function Tests: No results for input(s): TSH, T4TOTAL, FREET4, T3FREE, THYROIDAB in the last 72 hours. Anemia Panel: No results for input(s): VITAMINB12, FOLATE, FERRITIN, TIBC, IRON, RETICCTPCT in the last 72 hours. Urine analysis:    Component Value Date/Time   COLORURINE YELLOW 08/23/2017 0130   APPEARANCEUR HAZY (A) 08/23/2017 0130   LABSPEC 1.010 08/23/2017 0130   PHURINE 6.0 08/23/2017 0130   GLUCOSEU NEGATIVE 08/23/2017 0130   HGBUR SMALL (A) 08/23/2017 0130   BILIRUBINUR NEGATIVE 08/23/2017 0130   BILIRUBINUR n 07/01/2016 1139   KETONESUR NEGATIVE 08/23/2017 0130   PROTEINUR NEGATIVE 08/23/2017 0130   UROBILINOGEN negative (A) 07/01/2016 1139   NITRITE NEGATIVE 08/23/2017 0130   LEUKOCYTESUR TRACE (A) 08/23/2017 0130   Sepsis Labs: @LABRCNTIP (procalcitonin:4,lacticidven:4)  )No results found for this or any previous visit (from the past 240 hour(s)).    Studies: Dg Chest 2 View  Result Date: 08/23/2017 CLINICAL DATA:  Confusion.  Hypoglycemia. EXAM: CHEST - 2 VIEW COMPARISON:  04/24/2017 FINDINGS: Normal heart size and pulmonary vascularity. Linear fibrosis in the left lung base. Lungs are otherwise clear and expanded. No blunting of costophrenic angles. No pneumothorax. Mediastinal contours appear intact. Calcification of the aorta. Degenerative changes in the spine. IMPRESSION: No active cardiopulmonary disease. Electronically Signed   By: Lucienne Capers M.D.   On: 08/23/2017  00:40    Scheduled Meds: . amLODipine  5 mg Oral Daily   And  . benazepril  20 mg Oral Daily  . aspirin EC  81 mg Oral Daily  . carvedilol  6.25 mg Oral BID  . cholecalciferol  1,000 Units Oral Daily  . citalopram  20 mg Oral Daily  . enoxaparin (LOVENOX) injection  40 mg Subcutaneous Q24H  . fenofibrate  160 mg Oral Daily  . fluticasone  2 spray Each Nare Daily  . folic acid  1 mg Oral Daily  . insulin aspart  0-5 Units Subcutaneous QHS  . insulin aspart  0-9 Units Subcutaneous TID WC  . insulin aspart  3 Units Subcutaneous TID WC  . pantoprazole  40 mg Oral Daily  . simvastatin  20 mg Oral QHS  . traZODone  50 mg Oral QHS    Continuous Infusions:   LOS: 0 days     Alma Friendly, MD [facility]   If 7PM-7AM, please contact night-coverage [email or web address] Password [facility] 08/23/2017, 3:01 PM

## 2017-08-23 NOTE — ED Notes (Signed)
Pt. Given bagged lunch and orange juice.

## 2017-08-24 DIAGNOSIS — E1169 Type 2 diabetes mellitus with other specified complication: Secondary | ICD-10-CM | POA: Diagnosis not present

## 2017-08-24 DIAGNOSIS — N183 Chronic kidney disease, stage 3 (moderate): Secondary | ICD-10-CM

## 2017-08-24 DIAGNOSIS — K219 Gastro-esophageal reflux disease without esophagitis: Secondary | ICD-10-CM | POA: Diagnosis not present

## 2017-08-24 DIAGNOSIS — E162 Hypoglycemia, unspecified: Secondary | ICD-10-CM | POA: Diagnosis not present

## 2017-08-24 DIAGNOSIS — G9341 Metabolic encephalopathy: Secondary | ICD-10-CM

## 2017-08-24 DIAGNOSIS — J449 Chronic obstructive pulmonary disease, unspecified: Secondary | ICD-10-CM

## 2017-08-24 DIAGNOSIS — E782 Mixed hyperlipidemia: Secondary | ICD-10-CM

## 2017-08-24 LAB — CBC WITH DIFFERENTIAL/PLATELET
Abs Immature Granulocytes: 0 10*3/uL (ref 0.0–0.1)
Basophils Absolute: 0.1 10*3/uL (ref 0.0–0.1)
Basophils Relative: 1 %
Eosinophils Absolute: 0.2 10*3/uL (ref 0.0–0.7)
Eosinophils Relative: 2 %
HCT: 32.9 % — ABNORMAL LOW (ref 36.0–46.0)
Hemoglobin: 10.3 g/dL — ABNORMAL LOW (ref 12.0–15.0)
Immature Granulocytes: 1 %
Lymphocytes Relative: 32 %
Lymphs Abs: 2.7 10*3/uL (ref 0.7–4.0)
MCH: 29.3 pg (ref 26.0–34.0)
MCHC: 31.3 g/dL (ref 30.0–36.0)
MCV: 93.5 fL (ref 78.0–100.0)
Monocytes Absolute: 0.7 10*3/uL (ref 0.1–1.0)
Monocytes Relative: 9 %
Neutro Abs: 4.6 10*3/uL (ref 1.7–7.7)
Neutrophils Relative %: 55 %
Platelets: 360 10*3/uL (ref 150–400)
RBC: 3.52 MIL/uL — ABNORMAL LOW (ref 3.87–5.11)
RDW: 14.9 % (ref 11.5–15.5)
WBC: 8.3 10*3/uL (ref 4.0–10.5)

## 2017-08-24 LAB — GLUCOSE, CAPILLARY: Glucose-Capillary: 136 mg/dL — ABNORMAL HIGH (ref 70–99)

## 2017-08-24 LAB — BASIC METABOLIC PANEL
Anion gap: 6 (ref 5–15)
BUN: 33 mg/dL — ABNORMAL HIGH (ref 8–23)
CO2: 30 mmol/L (ref 22–32)
Calcium: 9.3 mg/dL (ref 8.9–10.3)
Chloride: 103 mmol/L (ref 98–111)
Creatinine, Ser: 2.06 mg/dL — ABNORMAL HIGH (ref 0.44–1.00)
GFR calc Af Amer: 27 mL/min — ABNORMAL LOW (ref >60)
GFR calc non Af Amer: 23 mL/min — ABNORMAL LOW (ref >60)
Glucose, Bld: 152 mg/dL — ABNORMAL HIGH (ref 70–99)
Potassium: 4.4 mmol/L (ref 3.5–5.1)
Sodium: 139 mmol/L (ref 135–145)

## 2017-08-24 MED ORDER — INSULIN DETEMIR 100 UNIT/ML FLEXPEN: 20.0000 [IU] | PEN_INJECTOR | Freq: Every day | SUBCUTANEOUS | 3 refills | Status: DC

## 2017-08-24 NOTE — Evaluation (Signed)
Physical Therapy Evaluation & Discharge Patient Details Name: Courtney Goodman MRN: [id number] DOB: September 14, 1945 Today's Date: 08/24/2017   History of Present Illness  Pt is a 72 y.o. female admitted 08/22/17 with AMS secondary to hypoglycemia. PMH includes asthma, anxiety, depression, CAD, CKDIII, DM.    Clinical Impression  Patient evaluated by Physical Therapy with no further acute PT needs identified. Pt currently indep with ADLs and mobility. All education has been completed and the patient has no further questions. PT is signing off. Thank you for this referral.    Follow Up Recommendations No PT follow up    Equipment Recommendations  None recommended by PT    Recommendations for Other Services       Precautions / Restrictions Precautions Precautions: None Restrictions Weight Bearing Restrictions: No      Mobility  Bed Mobility               General bed mobility comments: Sitting in recliner  Transfers Overall transfer level: Independent                  Ambulation/Gait Ambulation/Gait assistance: Independent Gait Distance (Feet): 400 Feet Assistive device: None Gait Pattern/deviations: Step-through pattern;Decreased stride length   Gait velocity interpretation: 1.31 - 2.62 ft/sec, indicative of limited community ambulator General Gait Details: Slow, steady amb  Stairs            Wheelchair Mobility    Modified Rankin (Stroke Patients Only)       Balance Overall balance assessment: Needs assistance   Sitting balance-Leahy Scale: Normal Sitting balance - Comments: Indep to don shoes      Standing balance-Leahy Scale: Good               High level balance activites: Side stepping;Backward walking;Turns;Direction changes;Sudden stops;Head turns High Level Balance Comments: No overt instability or LOB with higher level balance activities             Pertinent Vitals/Pain Pain Assessment: No/denies pain    Home Living  Family/patient expects to be discharged to:: Private residence Living Arrangements: Children Available Help at Discharge: Family;Available 24 hours/day Type of Home: House Home Access: Level entry     Home Layout: One level Home Equipment: Cane - single point      Prior Function Level of Independence: Independent         Comments: Does not drive     Hand Dominance        Extremity/Trunk Assessment   Upper Extremity Assessment Upper Extremity Assessment: Overall WFL for tasks assessed    Lower Extremity Assessment Lower Extremity Assessment: Overall WFL for tasks assessed       Communication   Communication: No difficulties  Cognition Arousal/Alertness: Awake/alert Behavior During Therapy: WFL for tasks assessed/performed Overall Cognitive Status: Within Functional Limits for tasks assessed                                        General Comments General comments (skin integrity, edema, etc.): Daughters present during session    Exercises     Assessment/Plan    PT Assessment Patent does not need any further PT services  PT Problem List         PT Treatment Interventions      PT Goals (Current goals can be found in the Care Plan section)  Acute Rehab PT Goals PT Goal Formulation: All assessment and  education complete, DC therapy    Frequency     Barriers to discharge        Co-evaluation               AM-PAC PT "6 Clicks" Daily Activity  Outcome Measure Difficulty turning over in bed (including adjusting bedclothes, sheets and blankets)?: None Difficulty moving from lying on back to sitting on the side of the bed? : None Difficulty sitting down on and standing up from a chair with arms (e.g., wheelchair, bedside commode, etc,.)?: None Help needed moving to and from a bed to chair (including a wheelchair)?: None Help needed walking in hospital room?: None Help needed climbing 3-5 steps with a railing? : A Little 6 Click  Score: 23    End of Session Equipment Utilized During Treatment: Gait belt Activity Tolerance: Patient tolerated treatment well Patient left: in chair;with call bell/phone within reach;with family/visitor present Nurse Communication: Mobility status PT Visit Diagnosis: Other abnormalities of gait and mobility (R26.89)    Time: 5397-6734 PT Time Calculation (min) (ACUTE ONLY): 14 min   Charges:   PT Evaluation $PT Eval Low Complexity: [place], PT, DPT Acute Rehab Services  Pager: [place] 08/24/2017, 9:08 AM

## 2017-08-24 NOTE — Progress Notes (Signed)
Patient ready for discharge to home; no new Rx's; discharge instructions given and reviewed; patient discharged out via wheelchair; accompanied home by her granddaughter.

## 2017-08-24 NOTE — Discharge Summary (Signed)
Discharge Summary  Courtney Goodman NUU:[id number] DOB: 06/28/1945  PCP: [name], NP-C  Admit date: 08/22/2017 Discharge date: 08/24/2017  Time spent: 25 mins  Recommendations for Outpatient Follow-up:  - Patient has PCP appointment scheduled for 08/25/2017.  To follow-up repeat BMP, insulin dosage -Patient follows up with [facility], advised to make appointment  Discharge Diagnoses:  Active Hospital Problems   Diagnosis Date Noted  . Acute metabolic encephalopathy 34/74/2595  . GERD (gastroesophageal reflux disease) 08/23/2017  . CAD (coronary artery disease) 08/23/2017  . Hypoglycemia 08/23/2017  . Hypokalemia 08/23/2017  . Type II diabetes mellitus with renal manifestations ([place]) 08/23/2017  . Major depression, recurrent ([place]) 05/27/2016  . Mixed hyperlipidemia due to type 2 diabetes mellitus ([place])   . CKD (chronic kidney disease), stage III ([place]) 11/23/2015  . HTN (hypertension) 11/23/2015  . COPD (chronic obstructive pulmonary disease) [facility])     Resolved Hospital Problems  No resolved problems to display.    Discharge Condition: Stable  Diet recommendation: Heart healthy/modified carb  Vitals:   08/24/17 0430 08/24/17 0857  BP: 124/62 (!) 141/70  Pulse: (!) 57 63  Resp: 14 16  Temp:  97.7 F (36.5 C)  SpO2: 96% 100%    History of present illness:  Courtney Okun Pettifordis a 71 y.o.femalewith medical history significant ofasthma, GERD, depression, anxiety, CAD, CKD 3, who presents with altered mental status likely due to hypoglycemia. Pt has been having poor appetite and continued to take insulin. In the ED pt was started on D10 infusion, with significant improvement of her mental status.  Patient denied any complaints, just reported generalized weakness/fatigue.  Patient admitted for further management.  Today, patient reported feeling much better better, eager to go home. Denies any new complaints.  Daughters at bedside, explained the  importance of follow-up appointments, especially for the adjustments of her insulin dose.    Hospital Course:  Principal Problem:   Acute metabolic encephalopathy Active Problems:   HTN (hypertension)   CKD (chronic kidney disease), stage III (HCC)   COPD (chronic obstructive pulmonary disease) (HCC)   Mixed hyperlipidemia due to type 2 diabetes mellitus (HCC)   Major depression, recurrent (HCC)   GERD (gastroesophageal reflux disease)   CAD (coronary artery disease)   Hypoglycemia   Hypokalemia   Type II diabetes mellitus with renal manifestations ([place])  Acute metabolic encephalopathy Resolved Due to hypoglycemia, CBG was 37 on admission (patient with poor appetite, but continued insulin/Victoza) Status post D10 Afebrile, no leukocytosis UA negative for infection Chest x-ray unremarkable Patient advised to follow-up with PCP/endocrinology to make appropriate adjustments to her insulin regimen, to avoid subsequent hypoglycemic episodes.  Type 2 diabetes mellitus Controlled Last A1c6.3 on 03/07/17 Victoza and Levemir 160 at home, continue Victoza, hold insulin until seen by PCP tomorrow Patient advised to follow-up with PCP/endocrinology to make appropriate adjustments to her insulin regimen, to avoid subsequent hypoglycemic episodes Diabetes coordinator consulted  HTN Continueamlodipine, Coreg, lisinopril  CKD stage III Creatinine with a little bump this a.m. Baseline creatinine 1.8 Patient advised to stay hydrated, held lisinopril throughout the admission PCP consider adjustment of lisinopril dose and repeat BMP tomorrow to monitor creatinine  COPD Stable Bronchodilators  GERD Protonix  CAD/HLD Chest pain free Continue Aspirin, Coreg, Zocor, TriCor  Major depression Continue home medications     Procedures:  None  Consultations:  None  Discharge Exam: BP (!) 141/70 (BP Location: Right Arm)   Pulse 63   Temp 97.7 F (36.5 C) (Oral)  Resp 16   Ht _0  (1.626 m)   Wt 78.7 kg (173 lb 8 oz)   SpO2 100%   BMI 29.78 kg/m   General: NAD Cardiovascular: S1, S2 present Respiratory: CTA B  Discharge Instructions You were cared for by a hospitalist during your hospital stay. If you have any questions about your discharge medications or the care you received while you were in the hospital after you are discharged, you can call the unit and asked to speak with the hospitalist on call if the hospitalist that took care of you is not available. Once you are discharged, your primary care physician will handle any further medical issues. Please note that NO REFILLS for any discharge medications will be authorized once you are discharged, as it is imperative that you return to your primary care physician (or establish a relationship with a primary care physician if you do not have one) for your aftercare needs so that they can reassess your need for medications and monitor your lab values.   Allergies as of 08/24/2017   No Known Allergies     Medication List    TAKE these medications   ACCU-CHEK AVIVA PLUS w/Device Kit Test twice a day. Dx code E11.9   albuterol 108 (90 Base) MCG/ACT inhaler Commonly known as:  PROVENTIL HFA;VENTOLIN HFA Inhale 2 puffs into the lungs every 6 (six) hours as needed for wheezing or shortness of breath.   amLODipine-benazepril 5-20 MG capsule Commonly known as:  LOTREL Take 1 capsule by mouth daily.   aspirin EC 81 MG tablet Take 81 mg by mouth daily.   Biotin 10000 MCG Tabs Take 1 tablet by mouth daily.   carvedilol 6.25 MG tablet Commonly known as:  COREG Take 1 tablet by mouth twice daily.   cholecalciferol 1000 units tablet Commonly known as:  VITAMIN D Take 1 tablet by mouth daily.   citalopram 20 MG tablet Commonly known as:  CELEXA Take 1 tablet (20 mg total) by mouth daily.   fenofibrate 145 MG tablet Commonly known as:  TRICOR Take 1 tablet (145 mg total) by mouth daily.    fluticasone 50 MCG/ACT nasal spray Commonly known as:  FLONASE Place 2 sprays into both nostrils daily.   folic acid 1 MG tablet Commonly known as:  FOLVITE Take 1 tablet (1 mg total) by mouth daily.   Insulin Detemir 100 UNIT/ML Pen Commonly known as:  LEVEMIR FLEXTOUCH Inject 20 Units into the skin daily. What changed:  See the new instructions.   liraglutide 18 MG/3ML Sopn Commonly known as:  VICTOZA Inject 1.8 mg subcutaneously daily.   omeprazole 40 MG capsule Commonly known as:  PRILOSEC Take 1 capsule (40 mg total) by mouth daily.   simvastatin 20 MG tablet Commonly known as:  ZOCOR Take 1 tablet by mouth every night.   traMADol 50 MG tablet Commonly known as:  ULTRAM Take 1 tablet by mouth daily as needed. What changed:    how much to take  how to take this  when to take this   traZODone 50 MG tablet Commonly known as:  DESYREL Take 50 mg by mouth at bedtime. Patient gets this from her therapist, not PCP   TRUE METRIX BLOOD GLUCOSE TEST test strip Generic drug:  glucose blood USE AS DIRECTED THREE TIMES A DAY      No Known Allergies    The results of significant diagnostics from this hospitalization (including imaging, microbiology, ancillary and laboratory) are listed below for  reference.    Significant Diagnostic Studies: Dg Chest 2 View  Result Date: 08/23/2017 CLINICAL DATA:  Confusion.  Hypoglycemia. EXAM: CHEST - 2 VIEW COMPARISON:  04/24/2017 FINDINGS: Normal heart size and pulmonary vascularity. Linear fibrosis in the left lung base. Lungs are otherwise clear and expanded. No blunting of costophrenic angles. No pneumothorax. Mediastinal contours appear intact. Calcification of the aorta. Degenerative changes in the spine. IMPRESSION: No active cardiopulmonary disease. Electronically Signed   By: Lucienne Capers M.D.   On: 08/23/2017 00:40   Dg Bone Density  Result Date: 08/08/2017 EXAM: DUAL X-RAY ABSORPTIOMETRY (DXA) FOR BONE MINERAL  DENSITY IMPRESSION: Referring Physician:  [name] Your patient completed a BMD test using Lunar IDXA DXA system ( analysis version: 16 ) manufactured by [employer]. PATIENT: Name: Leba, Tibbitts Patient ID: [id number] Birth Date: 01-Dec-1945 Height: 63.0 in. Sex: Female Measured: 08/08/2017 Weight: 177.1 lbs. Indications: Advanced Age, Albuterol, Celexa, Desyrel, Estrogen Deficient, Omeprazole, Postmenopausal Fractures: None Treatments: Vitamin D (E933.5) ASSESSMENT: The BMD measured at Femur Neck Left is 0.783 g/cm2 with a T-score of -1.8. This patient is considered osteopenic according to [facility]) criteria. This scan is good quality. L-3 and L-4 were excluded due to degenerative changes. Site Region Measured Date Measured Age YA BMD Significant CHANGE T-score DualFemur Neck Left  08/08/2017    72.5         -1.8    0.783 g/cm2 AP Spine  L1-L2      08/08/2017    72.5         -0.7    1.078 g/cm2 DualFemur Total Mean 08/08/2017    72.5         -0.7    0.925 g/cm2 World Health Organization [facility]) criteria for post-menopausal, Caucasian Women: Normal       T-score at or above -1 SD Osteopenia   T-score between -1 and -2.5 SD Osteoporosis T-score at or below -2.5 SD RECOMMENDATION: 1. All patients should optimize calcium and vitamin D intake. 2. Consider FDA approved medical therapies in postmenopausal women and men aged 39 years and older, based on the following: a. A hip or vertebral (clinical or morphometric) fracture b. T- score < or = -2.5 at the femoral neck or spine after appropriate evaluation to exclude secondary causes c. Low bone mass (T-score between -1.0 and -2.5 at the femoral neck or spine) and a 10 year probability of a hip fracture > or = 3% or a 10 year probability of a major osteoporosis-related fracture > or = 20% based on the [place]-adapted WHO algorithm d. Clinician judgment and/or patient preferences may indicate treatment for people with 10-year fracture probabilities  above or below these levels FOLLOW-UP: People with diagnosed cases of osteoporosis or at high risk for fracture should have regular bone mineral density tests. For patients eligible for Medicare, routine testing is allowed once every 2 years. The testing frequency can be increased to one year for patients who have rapidly progressing disease, those who are receiving or discontinuing medical therapy to restore bone mass, or have additional risk factors. I have reviewed this report and agree with the above findings. [facility] FRAX* 10-year Probability of Fracture Based on femoral neck BMD: DualFemur (Left) Major Osteoporotic Fracture: 5.0% Hip Fracture:                1.0% Population:                  [place] ([place]) Risk Factors:  None *FRAX is a Materials engineer of the [employer] for Metabolic Bone Disease, a [place] (WHO) [facility]. ASSESSMENT: The probability of a major osteoporotic fracture is 5.0 % within the next ten years. The probability of hip fracture is  1.0  % within the next 10 years. Electronically Signed   By: Marijo Conception, M.D.   On: 08/08/2017 08:56   Mm 3d Screen Breast Bilateral  Addendum Date: 08/21/2017   ADDENDUM REPORT: 08/21/2017 14:44 ADDENDUM: Please note that the new mass (possibly related to the skin) described in the report is in the RIGHT breast. Therefore, the recommendation is for the patient to have ultrasound of the RIGHT breast. Electronically Signed   By: Evangeline Dakin M.D.   On: 08/21/2017 14:44   Result Date: 08/21/2017 CLINICAL DATA:  Screening. EXAM: DIGITAL SCREENING BILATERAL MAMMOGRAM WITH TOMO AND CAD COMPARISON:  11/11/2013, 11/02/2012 and earlier. ACR Breast Density Category b: There are scattered areas of fibroglandular density. FINDINGS: In the left breast, a new mass which may be in the skin or subcutaneous tissues warrants further evaluation. In the right breast, no findings  suspicious for malignancy. Images were processed with CAD. IMPRESSION: Further evaluation is suggested for a new mass in the left breast. RECOMMENDATION: Ultrasound of the left breast. (Code:[id number]-L-32M) The patient will be contacted regarding the findings, and additional imaging will be scheduled. BI-RADS CATEGORY  0: Incomplete. Need additional imaging evaluation and/or prior mammograms for comparison. Electronically Signed: By: Evangeline Dakin M.D. On: 08/08/2017 10:31    Microbiology: No results found for this or any previous visit (from the past 240 hour(s)).   Labs: Basic Metabolic Panel: Recent Labs  Lab 08/23/17 0043 08/23/17 0443 08/24/17 0629  NA 141 138 139  K 3.3* 3.8 4.4  CL 105 100 103  CO2 _0 GLUCOSE 47* 313* 152*  BUN 28* 25* 33*  CREATININE 1.57* 1.49* 2.06*  CALCIUM 9.8 9.8 9.3   Liver Function Tests: Recent Labs  Lab 08/23/17 0043  AST 24  ALT 14  ALKPHOS 45  BILITOT 0.7  PROT 7.8  ALBUMIN 3.6   No results for input(s): LIPASE, AMYLASE in the last 168 hours. No results for input(s): AMMONIA in the last 168 hours. CBC: Recent Labs  Lab 08/23/17 0043 08/23/17 0443 08/24/17 0629  WBC 8.1 8.4 8.3  NEUTROABS 5.9  --  4.6  HGB 12.1 12.5 10.3*  HCT 38.3 39.5 32.9*  MCV 93.4 92.9 93.5  PLT 394 412* 360   Cardiac Enzymes: No results for input(s): CKTOTAL, CKMB, CKMBINDEX, TROPONINI in the last 168 hours. BNP: BNP (last 3 results) No results for input(s): BNP in the last 8760 hours.  ProBNP (last 3 results) No results for input(s): PROBNP in the last 8760 hours.  CBG: Recent Labs  Lab 08/23/17 0947 08/23/17 1136 08/23/17 1632 08/23/17 2153 08/24/17 0631  GLUCAP 335* 233* 213* 220* 136*       Signed:  Alma Friendly, MD [facility] 08/24/2017, 10:41 AM

## 2017-08-25 ENCOUNTER — Encounter: Payer: Self-pay | Admitting: Family Medicine

## 2017-08-25 ENCOUNTER — Telehealth: Payer: Self-pay | Admitting: Internal Medicine

## 2017-08-25 ENCOUNTER — Ambulatory Visit (INDEPENDENT_AMBULATORY_CARE_PROVIDER_SITE_OTHER): Payer: Medicare HMO | Admitting: Family Medicine

## 2017-08-25 ENCOUNTER — Telehealth: Payer: Self-pay | Admitting: Emergency Medicine

## 2017-08-25 VITALS — BP 110/60 | HR 71 | Wt 175.8 lb

## 2017-08-25 DIAGNOSIS — E1165 Type 2 diabetes mellitus with hyperglycemia: Secondary | ICD-10-CM

## 2017-08-25 DIAGNOSIS — Z09 Encounter for follow-up examination after completed treatment for conditions other than malignant neoplasm: Secondary | ICD-10-CM | POA: Diagnosis not present

## 2017-08-25 DIAGNOSIS — Z9119 Patient's noncompliance with other medical treatment and regimen: Secondary | ICD-10-CM | POA: Diagnosis not present

## 2017-08-25 DIAGNOSIS — D638 Anemia in other chronic diseases classified elsewhere: Secondary | ICD-10-CM | POA: Diagnosis not present

## 2017-08-25 DIAGNOSIS — Z91199 Patient's noncompliance with other medical treatment and regimen due to unspecified reason: Secondary | ICD-10-CM

## 2017-08-25 DIAGNOSIS — I10 Essential (primary) hypertension: Secondary | ICD-10-CM | POA: Diagnosis not present

## 2017-08-25 LAB — POCT CBG (FASTING - GLUCOSE)-MANUAL ENTRY: Glucose Fasting, POC: 346 mg/dL — AB (ref 70–99)

## 2017-08-25 LAB — GLUCOSE, CAPILLARY: Glucose-Capillary: 276 mg/dL — ABNORMAL HIGH (ref 70–99)

## 2017-08-25 NOTE — Telephone Encounter (Signed)
LVM for patient to call back to see if she can make appointment which should be 08/27/17 at 2:45.

## 2017-08-25 NOTE — Progress Notes (Signed)
Subjective:    Patient ID: Courtney Goodman, female    DOB: [birth year], 72 y.o.   MRN: [id number]  HPI Chief Complaint  Patient presents with  . discuss shot    discuss shot- she doesn't want to do shot and would like another option.    She is here to follow up on recent hospital discharge for hypoglycemia. She was transported by an ambulance to the ED on 08/22/2017 and discharged home yesterday.  States her daughter came home from work on 07/26 and found patient asleep and her daughter could not wake her up. Her BS was found to be in the 30s by EMS. She was immediately treated and taken to the ED.  States she has been skipping meals and taking her diabetes medications and thinks this is what caused her hypoglycemia.  Reports generalized weakness today.   She lives alone and states she is having a difficult time keeping up with her medications. States she has not taken any of her medications today. Has not eaten today.  States she typically checks BS twice daily. Has not checked it today.   She did not take levemir last night. States they gave it to her yesterday when she was discharged from the hospital.  She is Victoza as well but last dose yesterday in the hospital.   Dr. Loanne Drilling is her endocrinologist. She has not schedule a follow up visit with him since her recent hypoglycemic event.   Dr. Florene Glen is her nephrologist and managing her CKD.    She is being treated for anemia of chronic disease by [facility].    Denies fever, chills, chest pain, palpitations, shortness of breath, abdominal pain, N/V/D, urinary symptoms, LE edema.   Past Medical History:  Diagnosis Date  . Anemia of chronic disease   . Asthma   . Atherosclerotic heart disease of native coronary artery without angina pectoris   . CKD (chronic kidney disease)   . COPD (chronic obstructive pulmonary disease) ([place])   . DM (diabetes mellitus), type 2 with renal complications ([place])   . Dysrhythmia   .  Elevated ferritin 01/03/2017  . GAD (generalized anxiety disorder)   . Hypertension   . Mixed hyperlipidemia due to type 2 diabetes mellitus ([place])   . Mixed incontinence    per medical records from [place]  . Osteopenia   . Personal history of noncompliance with medical treatment, presenting hazards to health   . Shortness of breath dyspnea   . Suicidal ideation   . TB (tuberculosis), treated    age 31  . Vitamin D deficiency    Past Surgical History:  Procedure Laterality Date  . ANKLE RECONSTRUCTION     right  . CARPAL TUNNEL RELEASE    . CORONARY ANGIOPLASTY      Current Outpatient Medications on File Prior to Visit  Medication Sig Dispense Refill  . albuterol (PROVENTIL HFA;VENTOLIN HFA) 108 (90 Base) MCG/ACT inhaler Inhale 2 puffs into the lungs every 6 (six) hours as needed for wheezing or shortness of breath. 1 Inhaler 0  . amLODipine-benazepril (LOTREL) 5-20 MG capsule Take 1 capsule by mouth daily. 90 capsule 0  . aspirin EC 81 MG tablet Take 81 mg by mouth daily.     . Biotin 10000 MCG TABS Take 1 tablet by mouth daily.     . Blood Glucose Monitoring Suppl (ACCU-CHEK AVIVA PLUS) w/Device KIT Test twice a day. Dx code E11.9 1 kit 0  . carvedilol (COREG) 6.25 MG  tablet Take 1 tablet by mouth twice daily. 90 tablet 1  . cholecalciferol (VITAMIN D) 1000 units tablet Take 1 tablet by mouth daily. 30 tablet 5  . citalopram (CELEXA) 20 MG tablet Take 1 tablet (20 mg total) by mouth daily. 90 tablet 1  . fenofibrate (TRICOR) 145 MG tablet Take 1 tablet (145 mg total) by mouth daily. 90 tablet 1  . fluticasone (FLONASE) 50 MCG/ACT nasal spray Place 2 sprays into both nostrils daily. 48 g 0  . folic acid (FOLVITE) 1 MG tablet Take 1 tablet (1 mg total) by mouth daily. 90 tablet 1  . Insulin Detemir (LEVEMIR FLEXTOUCH) 100 UNIT/ML Pen Inject 20 Units into the skin daily. 15 pen 3  . liraglutide (VICTOZA) 18 MG/3ML SOPN Inject 1.8 mg subcutaneously daily. 3 pen 11  . omeprazole  (PRILOSEC) 40 MG capsule Take 1 capsule (40 mg total) by mouth daily. 90 capsule 1  . simvastatin (ZOCOR) 20 MG tablet Take 1 tablet by mouth every night. 90 tablet 1  . traMADol (ULTRAM) 50 MG tablet Take 1 tablet by mouth daily as needed. (Patient taking differently: Take 1 tablet by mouth daily as needed. for pain) 90 tablet 0  . traZODone (DESYREL) 50 MG tablet Take 50 mg by mouth at bedtime. Patient gets this from her therapist, not PCP    . TRUE METRIX BLOOD GLUCOSE TEST test strip USE AS DIRECTED THREE TIMES A DAY 100 each 2   No current facility-administered medications on file prior to visit.     Reviewed allergies, medications, past medical, surgical, family, and social history.    Review of Systems Pertinent positives and negatives in the history of present illness.     Objective:   Physical Exam BP 110/60   Pulse 71   Wt 175 lb 12.8 oz (79.7 kg)   BMI 30.18 kg/m   Alert and oriented and in no distress. Pharyngeal area is normal. Neck is supple without adenopathy or thyromegaly. Cardiac exam shows a regular sinus rhythm without murmurs or gallops. Lungs are clear to auscultation. Abdomen is soft, non distended, non tender. Extremities without edema, normal intact distal pulses. Skin is warm and dry, no pallor. PERRLA, EOMs intact. CN intact. Normal gait.        Assessment & Plan:  Hospital discharge follow-up - Plan: AMB Referral to [place] [facility]  Type 2 diabetes mellitus with hyperglycemia, unspecified whether long term insulin use ([place]) - Plan: AMB Referral to [place] [facility], Glucose (CBG), Fasting  Essential hypertension  Anemia of chronic disease  Personal history of noncompliance with medical treatment, presenting hazards to health  FBS is 346 in our office. We do not have levemir in our office. She has this at home. Advised her to go home and take her medication and eat. Recommend she avoid skipping meals and stay well hydrated. Advised that she  check her blood sugars at least 3 times per day.  [facility] and attempting to get her in to see them ASAP.  Vitals are wnl. She is hemodynamically stable and in no acute distress.  Plan to set up home health nurse to assist her with medications and to do home assessment. I am concerned about her living alone and not having regular visits by family members.  I will follow up via telephone with her tomorrow and ensure she has an appointment with endocrinology and have her  Wednesday to see me if or next week if she will be seeing Dr.  Loanne Drilling this week.  Reviewed hospital notes, discharge summary, labs, and all studies. Medications reviewed.

## 2017-08-25 NOTE — Patient Instructions (Addendum)
  Check your blood sugars 3 times daily. Your blood sugar is 346.   Eat 3 meals per day and have 2 small snacks in between. Stay hydrated.   Start back on your medications as soon as you get home this morning. Make sure you eat.   I am setting up home health to come out to help you.   Call and check with Dr. Cordelia Pen office when you get home as well. They should see you sometime this week.   Follow up with me on Wednesday.

## 2017-08-25 NOTE — Telephone Encounter (Signed)
Pt called and patient was in the hospital and recently discharged for Hypoglycemia. She needs to make a hospital FU and needs to be seen before your next available 30 min appt. Is there a date and time that is best to fit her into? Let patient know we will call her back to schedule. Thanks.

## 2017-08-25 NOTE — Telephone Encounter (Signed)
Pt called back and informed about appt on 08/27/17 2:45.

## 2017-08-25 NOTE — Telephone Encounter (Signed)
Called and left vm about pt calling endocrinology to scheduled for 7/31 and seeing [place] next week instead.

## 2017-08-25 NOTE — Telephone Encounter (Signed)
2:45 PM, 09/27/17

## 2017-08-26 ENCOUNTER — Telehealth: Payer: Self-pay | Admitting: Internal Medicine

## 2017-08-26 ENCOUNTER — Other Ambulatory Visit: Payer: Self-pay

## 2017-08-26 NOTE — Patient Outreach (Signed)
Courtney Goodman) Care Management  08/26/2017  Talley Casco [place] 08-14-1945 [id number]   Referral Date: 08/25/17 Referral Source: MD referral Referral Reason: Home assessment and assist with medications.     Outreach Attempt: spoke with patient. She is able to verify HIPAA.  Discussed reason for referral.  Patient agrees that she needs someone to come in and assist her with education and support in her diabetes.  Patient reports that she does not eat properly and does not know things to purchase at the grocery store.     Social: Patient lives alone.  She has two daughters but states that they work and stop by to see patient as they can. Patient does not drive.     Conditions: Patient recently hospitalized for hypoglycemia after daughter was unable to awake her.  Patient reports that she has had diabetes for some time and struggles with eating regularly and buying the right things. Patient also admits to HTN and being [facility] and wears hearing aide to left ear.    Medications: Patient reports that she takes her medications regularly.  However, MD noted that patient has some medication non-compliance.  Patient states she has medication via pill pack and denies a need for pharmacy at this time.     Appointments: Patient saw primary care physician on yesterday per patient.   Advanced Directives: Patient does not have an advanced directive.     Consent: Discussed with patient [facility] services and how [facility] could support her.  Patient agrees to community nurse at this time.   Plan: RN CM will refer to community nurse for care coordination, high risk for readmission, home assessment, and diabetes education and support.   Jone Baseman, RN, MSN [facility] Care Management Care Management Coordinator Direct Line [phone] Toll Free: [phone]  Fax: [phone]

## 2017-08-26 NOTE — Telephone Encounter (Signed)
Called pt to find out about her blood sugars but pt did not answer so I left a vm

## 2017-08-27 ENCOUNTER — Ambulatory Visit (INDEPENDENT_AMBULATORY_CARE_PROVIDER_SITE_OTHER): Payer: Medicare HMO | Admitting: Endocrinology

## 2017-08-27 ENCOUNTER — Ambulatory Visit: Payer: Medicare HMO | Admitting: Family Medicine

## 2017-08-27 ENCOUNTER — Encounter: Payer: Self-pay | Admitting: Endocrinology

## 2017-08-27 ENCOUNTER — Other Ambulatory Visit: Payer: Self-pay | Admitting: *Deleted

## 2017-08-27 VITALS — BP 122/60 | HR 76 | Temp 98.4°F | Ht 64.0 in | Wt 174.5 lb

## 2017-08-27 DIAGNOSIS — Z794 Long term (current) use of insulin: Secondary | ICD-10-CM | POA: Diagnosis not present

## 2017-08-27 DIAGNOSIS — E1122 Type 2 diabetes mellitus with diabetic chronic kidney disease: Secondary | ICD-10-CM

## 2017-08-27 DIAGNOSIS — N183 Chronic kidney disease, stage 3 (moderate): Secondary | ICD-10-CM

## 2017-08-27 LAB — POCT GLYCOSYLATED HEMOGLOBIN (HGB A1C): Hemoglobin A1C: 6.7 % — AB (ref 4.0–5.6)

## 2017-08-27 MED ORDER — INSULIN DETEMIR 100 UNIT/ML FLEXPEN: 140.0000 [IU] | PEN_INJECTOR | SUBCUTANEOUS | 3 refills | Status: DC

## 2017-08-27 NOTE — Patient Instructions (Addendum)
check your blood sugar twice a day.  vary the time of day when you check, between before the 3 meals, and at bedtime.  also check if you have symptoms of your blood sugar being too high or too low.  please keep a record of the readings and bring it to your next appointment here (or you can bring the meter itself).  You can write it on any piece of paper.  please call us sooner if your blood sugar goes below 70, or if you have a lot of readings over 200.   please reduce the levemir to 140 units each morning.   On this type of insulin schedule, you should eat meals on a regular schedule.  If a meal is missed or significantly delayed, your blood sugar could go low.   Please come back for a follow-up appointment in 2 months.

## 2017-08-27 NOTE — Patient Outreach (Signed)
[place] [facility]) Care Management  08/27/2017  Courtney Goodman 30-Apr-1945 [id number]   Referral received from telephonic care manager for diabetes manager.  Member was recently admitted to hospital 7/26-7/28 for hypoglycemia.  Primary MD office performed transition of care assessment.   Per chart, she also has history of hypertension, CAD, COPD, hyperlipidemia, and chronic kidney disease.  Call placed to member, she state she is on her way to endocrinologist appointment, will call back once visit complete.     Update @ 1630:   Call placed back to member after voice message.  She state insulin dose was decreased, A1C today 6.7.  Still feel she will need ongoing help with managing diabetes.  Agrees to home visit next week.  Will perform assessment and develop individualized plan of care at that time.  [name], [place], MSN [place] [phone]

## 2017-08-27 NOTE — Progress Notes (Signed)
Subjective:    Patient ID: Courtney Goodman, female    DOB: 1945/08/04, 72 y.o.   MRN: [id number]  HPI Pt returns for f/u of diabetes mellitus: DM type: Insulin-requiring type 2 Dx'ed: 4166 Complications: renal insufficiency and CAD Therapy: insulin since 2009, and victoza. GDM: never DKA: never Severe hypoglycemia: 2012 and 2019. Pancreatitis: never.  Other: she is on qd insulin, due to h/o noncompliance.   Interval history: Last week, she had an episode of severe hypoglycemia.  She says this happens when she did not eat all day.  She says she does not know why this is.  She says she takes insulin as rx'ed.   Past Medical History:  Diagnosis Date  . Anemia of chronic disease   . Asthma   . Atherosclerotic heart disease of native coronary artery without angina pectoris   . CKD (chronic kidney disease)   . COPD (chronic obstructive pulmonary disease) ([place])   . DM (diabetes mellitus), type 2 with renal complications ([place])   . Dysrhythmia   . Elevated ferritin 01/03/2017  . GAD (generalized anxiety disorder)   . Hypertension   . Mixed hyperlipidemia due to type 2 diabetes mellitus ([place])   . Mixed incontinence    per medical records from [place]  . Osteopenia   . Personal history of noncompliance with medical treatment, presenting hazards to health   . Shortness of breath dyspnea   . Suicidal ideation   . TB (tuberculosis), treated    age 37  . Vitamin D deficiency     Past Surgical History:  Procedure Laterality Date  . ANKLE RECONSTRUCTION     right  . CARPAL TUNNEL RELEASE    . CORONARY ANGIOPLASTY      Social History   Socioeconomic History  . Marital status: Divorced    Spouse name: Not on file  . Number of children: Not on file  . Years of education: Not on file  . Highest education level: Not on file  Occupational History  . Not on file  Social Needs  . Financial resource strain: Not on file  . Food insecurity:    Worry: Not on file    Inability: Not on  file  . Transportation needs:    Medical: Not on file    Non-medical: Not on file  Tobacco Use  . Smoking status: Former Research scientist (life sciences)  . Smokeless tobacco: Never Used  Substance and Sexual Activity  . Alcohol use: No  . Drug use: No  . Sexual activity: Never  Lifestyle  . Physical activity:    Days per week: Not on file    Minutes per session: Not on file  . Stress: Not on file  Relationships  . Social connections:    Talks on phone: Not on file    Gets together: Not on file    Attends religious service: Not on file    Active member of club or organization: Not on file    Attends meetings of clubs or organizations: Not on file    Relationship status: Not on file  . Intimate partner violence:    Fear of current or ex partner: Not on file    Emotionally abused: Not on file    Physically abused: Not on file    Forced sexual activity: Not on file  Other Topics Concern  . Not on file  Social History Narrative  . Not on file    Current Outpatient Medications on File Prior to Visit  Medication  Sig Dispense Refill  . albuterol (PROVENTIL HFA;VENTOLIN HFA) 108 (90 Base) MCG/ACT inhaler Inhale 2 puffs into the lungs every 6 (six) hours as needed for wheezing or shortness of breath. 1 Inhaler 0  . amLODipine-benazepril (LOTREL) 5-20 MG capsule Take 1 capsule by mouth daily. 90 capsule 0  . aspirin EC 81 MG tablet Take 81 mg by mouth daily.     . Biotin 10000 MCG TABS Take 1 tablet by mouth daily.     . Blood Glucose Monitoring Suppl (ACCU-CHEK AVIVA PLUS) w/Device KIT Test twice a day. Dx code E11.9 1 kit 0  . cholecalciferol (VITAMIN D) 1000 units tablet Take 1 tablet by mouth daily. 30 tablet 5  . citalopram (CELEXA) 20 MG tablet Take 1 tablet (20 mg total) by mouth daily. 90 tablet 1  . fenofibrate (TRICOR) 145 MG tablet Take 1 tablet (145 mg total) by mouth daily. 90 tablet 1  . fluticasone (FLONASE) 50 MCG/ACT nasal spray Place 2 sprays into both nostrils daily. 48 g 0  . folic acid  (FOLVITE) 1 MG tablet Take 1 tablet (1 mg total) by mouth daily. 90 tablet 1  . liraglutide (VICTOZA) 18 MG/3ML SOPN Inject 1.8 mg subcutaneously daily. 3 pen 11  . omeprazole (PRILOSEC) 40 MG capsule Take 1 capsule (40 mg total) by mouth daily. 90 capsule 1  . simvastatin (ZOCOR) 20 MG tablet Take 1 tablet by mouth every night. 90 tablet 1  . traMADol (ULTRAM) 50 MG tablet Take 1 tablet by mouth daily as needed. (Patient taking differently: Take 1 tablet by mouth daily as needed. for pain) 90 tablet 0  . traZODone (DESYREL) 50 MG tablet Take 50 mg by mouth at bedtime. Patient gets this from her therapist, not PCP    . TRUE METRIX BLOOD GLUCOSE TEST test strip USE AS DIRECTED THREE TIMES A DAY 100 each 2   No current facility-administered medications on file prior to visit.     No Known Allergies  Family History  Problem Relation Age of Onset  . Diabetes Mother     BP 122/60 (BP Location: Left Arm, Patient Position: Sitting, Cuff Size: Normal)   Pulse 76   Temp 98.4 F (36.9 C) (Oral)   Ht _0  (1.626 m)   Wt 174 lb 8 oz (79.2 kg)   SpO2 97%   BMI 29.95 kg/m    Review of Systems She has lost a few lbs.      Objective:   Physical Exam VITAL SIGNS:  See vs page GENERAL: no distress Pulses: dorsalis pedis intact bilat.   MSK: no deformity of the feet CV: no leg edema Skin:  no ulcer on the feet.  normal color and temp on the feet. Neuro: sensation is intact to touch on the feet.    A1c=7.7%    Assessment & Plan:  Hypoglycemia: worse: she is not a candidate for aggressive glycemic control Insulin-requiring type 2 DM, with renal insuff.   Patient Instructions  check your blood sugar twice a day.  vary the time of day when you check, between before the 3 meals, and at bedtime.  also check if you have symptoms of your blood sugar being too high or too low.  please keep a record of the readings and bring it to your next appointment here (or you can bring the meter itself).   You can write it on any piece of paper.  please call us sooner if your blood sugar goes below 70, or  if you have a lot of readings over 200.   please reduce the levemir to 140 units each morning.   On this type of insulin schedule, you should eat meals on a regular schedule.  If a meal is missed or significantly delayed, your blood sugar could go low.   Please come back for a follow-up appointment in 2 months.

## 2017-08-28 ENCOUNTER — Telehealth: Payer: Self-pay

## 2017-08-28 ENCOUNTER — Ambulatory Visit
Admission: RE | Admit: 2017-08-28 | Discharge: 2017-08-28 | Disposition: A | Discharge status: Home / Self Care | Payer: Medicare HMO | Source: Ambulatory Visit | Attending: Family Medicine | Admitting: Family Medicine

## 2017-08-28 DIAGNOSIS — R928 Other abnormal and inconclusive findings on diagnostic imaging of breast: Secondary | ICD-10-CM

## 2017-08-28 NOTE — Telephone Encounter (Signed)
If she no longer wants to see hematology for anemia treatment, that is okay.  I reviewed hematologist report and they discussed that she may see her nephrologist in the future for this.  Let's have her follow-up with her nephrologist regarding the anemia and CKD.  I do not prescribe the type of medication they were giving her.  She does not need to follow-up with me regarding her blood sugars.  She did see her endocrinologist this week and they made changes to her medication.

## 2017-08-28 NOTE — Telephone Encounter (Signed)
Pt called and states that she wants to know if you will prescribe something else for her anemia as she does not want to go through hematology anymore since she goes to so many doctors. sabrina advised her that hematologist specializes in [place] and blood disorders and that she would have to contact them about that changing to something else since her insurance would not cover the shot anymore but pt wanted me to still ask you about this.   Pt also wanted to ask about her coming back in for an appt. Pt was advised that she needed to be seen for BS. Please advise if pt still needs to be seen.

## 2017-08-29 ENCOUNTER — Other Ambulatory Visit: Payer: Self-pay | Admitting: Family Medicine

## 2017-08-29 ENCOUNTER — Telehealth: Payer: Self-pay | Admitting: *Deleted

## 2017-08-29 ENCOUNTER — Telehealth: Payer: Self-pay | Admitting: Medical Oncology

## 2017-08-29 ENCOUNTER — Other Ambulatory Visit: Payer: Medicare HMO

## 2017-08-29 NOTE — Telephone Encounter (Signed)
Call received requesting "name of injection patient is to receive to increase red blood cells that is not being approved by [employer] and Billing."   Epoetin is the injected which can stimulate an increased production of red blood cells.  No further questions at this time.  [place] authorization status displays authorized for this supportive therapy.

## 2017-08-29 NOTE — Telephone Encounter (Signed)
Left message for pt to call me back 

## 2017-08-29 NOTE — Telephone Encounter (Signed)
Pt was notified to contact her kidney specialist about the shot .

## 2017-08-29 NOTE — Telephone Encounter (Signed)
Returned pt call, no answer -left VM to return my call.

## 2017-09-02 ENCOUNTER — Encounter: Payer: Self-pay | Admitting: *Deleted

## 2017-09-02 ENCOUNTER — Inpatient Hospital Stay: Payer: Medicare HMO

## 2017-09-02 ENCOUNTER — Other Ambulatory Visit: Payer: Self-pay | Admitting: *Deleted

## 2017-09-02 ENCOUNTER — Inpatient Hospital Stay: Payer: Medicare HMO | Attending: Oncology

## 2017-09-02 DIAGNOSIS — N189 Chronic kidney disease, unspecified: Secondary | ICD-10-CM

## 2017-09-02 DIAGNOSIS — J449 Chronic obstructive pulmonary disease, unspecified: Secondary | ICD-10-CM | POA: Diagnosis not present

## 2017-09-02 DIAGNOSIS — Z794 Long term (current) use of insulin: Secondary | ICD-10-CM | POA: Diagnosis not present

## 2017-09-02 DIAGNOSIS — Z9119 Patient's noncompliance with other medical treatment and regimen: Secondary | ICD-10-CM | POA: Diagnosis not present

## 2017-09-02 DIAGNOSIS — E559 Vitamin D deficiency, unspecified: Secondary | ICD-10-CM | POA: Diagnosis not present

## 2017-09-02 DIAGNOSIS — M8580 Other specified disorders of bone density and structure, unspecified site: Secondary | ICD-10-CM | POA: Diagnosis not present

## 2017-09-02 DIAGNOSIS — F419 Anxiety disorder, unspecified: Secondary | ICD-10-CM | POA: Diagnosis not present

## 2017-09-02 DIAGNOSIS — E785 Hyperlipidemia, unspecified: Secondary | ICD-10-CM | POA: Diagnosis not present

## 2017-09-02 DIAGNOSIS — Z915 Personal history of self-harm: Secondary | ICD-10-CM | POA: Insufficient documentation

## 2017-09-02 DIAGNOSIS — Z79899 Other long term (current) drug therapy: Secondary | ICD-10-CM | POA: Insufficient documentation

## 2017-09-02 DIAGNOSIS — D631 Anemia in chronic kidney disease: Secondary | ICD-10-CM | POA: Diagnosis not present

## 2017-09-02 DIAGNOSIS — I129 Hypertensive chronic kidney disease with stage 1 through stage 4 chronic kidney disease, or unspecified chronic kidney disease: Secondary | ICD-10-CM | POA: Insufficient documentation

## 2017-09-02 DIAGNOSIS — E1122 Type 2 diabetes mellitus with diabetic chronic kidney disease: Secondary | ICD-10-CM | POA: Insufficient documentation

## 2017-09-02 DIAGNOSIS — Z7982 Long term (current) use of aspirin: Secondary | ICD-10-CM | POA: Insufficient documentation

## 2017-09-02 DIAGNOSIS — D638 Anemia in other chronic diseases classified elsewhere: Secondary | ICD-10-CM

## 2017-09-02 DIAGNOSIS — I2510 Atherosclerotic heart disease of native coronary artery without angina pectoris: Secondary | ICD-10-CM | POA: Insufficient documentation

## 2017-09-02 LAB — CBC WITH DIFFERENTIAL (CANCER CENTER ONLY)
Basophils Absolute: 0.1 10*3/uL (ref 0.0–0.1)
Basophils Relative: 1 %
Eosinophils Absolute: 0.1 10*3/uL (ref 0.0–0.5)
Eosinophils Relative: 2 %
HCT: 33.1 % — ABNORMAL LOW (ref 34.8–46.6)
Hemoglobin: 11.1 g/dL — ABNORMAL LOW (ref 11.6–15.9)
Lymphocytes Relative: 19 %
Lymphs Abs: 1.5 10*3/uL (ref 0.9–3.3)
MCH: 29.9 pg (ref 25.1–34.0)
MCHC: 33.5 g/dL (ref 31.5–36.0)
MCV: 89.4 fL (ref 79.5–101.0)
Monocytes Absolute: 0.5 10*3/uL (ref 0.1–0.9)
Monocytes Relative: 6 %
Neutro Abs: 5.8 10*3/uL (ref 1.5–6.5)
Neutrophils Relative %: 72 %
Platelet Count: 332 10*3/uL (ref 145–400)
RBC: 3.71 MIL/uL (ref 3.70–5.45)
RDW: 15.1 % — ABNORMAL HIGH (ref 11.2–14.5)
WBC Count: 8 10*3/uL (ref 3.9–10.3)

## 2017-09-02 MED ORDER — EPOETIN ALFA 40000 UNIT/ML IJ SOLN
INTRAMUSCULAR | Status: AC
Start: 2017-09-02 — End: ?
  Filled 2017-09-02: qty 1

## 2017-09-02 NOTE — Progress Notes (Signed)
Pt did not meet parameters to receive injection. Hgb 11.1. Explained to pt about injection and parameters. Pt understood and will return for next appt on 8/27. Lab results were given to pt. Mistina Coatney LPN
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
## 2017-09-04 ENCOUNTER — Other Ambulatory Visit: Payer: Self-pay | Admitting: Licensed Clinical Social Worker

## 2017-09-04 NOTE — Patient Outreach (Signed)
[place] [facility]) Care Management  09/04/2017  Kadijah Shamoon Miranda [birth year] [id number]  BSW received request from [place], [name], to mail housing and mental health resources to patient. Resources mailed today.   [name], BSW Social Worker [phone]
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
## 2017-09-17 ENCOUNTER — Other Ambulatory Visit: Payer: Self-pay | Admitting: Licensed Clinical Social Worker

## 2017-09-17 NOTE — Patient Outreach (Signed)
[place] [facility]) Care Management  09/17/2017  Courtney Goodman July 07, 1945 [id number]  [facility] CSW completed call to patient to follow up on housing resources that were sent to her. Patient answered and providing HIPPA verifications. She reports that she received the housing resource packet that [place] mailed to her. Patient reports that she has been looking over these resources but thinks she has made the decision to get on the wait list to relocate to [facility]. [facility] CSW completed final review of all housing resources that were sent to patient by mail. Patient receptive to education and states that she has no further social work needs at this time. Patient appreciative of social work support. Patient comfortable with plan and will go to [facility] this weekend to complete their application in order to get on the wait list. [facility] CSW will sign off at this time.  [name], BSW, MSW, [email or web address] Phone: [phone] Fax: [phone]

## 2017-09-23 ENCOUNTER — Encounter: Payer: Self-pay | Admitting: Internal Medicine

## 2017-09-23 ENCOUNTER — Inpatient Hospital Stay (HOSPITAL_BASED_OUTPATIENT_CLINIC_OR_DEPARTMENT_OTHER): Payer: Medicare HMO | Admitting: Internal Medicine

## 2017-09-23 ENCOUNTER — Inpatient Hospital Stay: Payer: Medicare HMO

## 2017-09-23 VITALS — BP 157/91 | HR 108 | Temp 98.4°F | Resp 18 | Ht 64.0 in | Wt 174.5 lb

## 2017-09-23 DIAGNOSIS — N189 Chronic kidney disease, unspecified: Secondary | ICD-10-CM | POA: Diagnosis not present

## 2017-09-23 DIAGNOSIS — I129 Hypertensive chronic kidney disease with stage 1 through stage 4 chronic kidney disease, or unspecified chronic kidney disease: Secondary | ICD-10-CM | POA: Diagnosis not present

## 2017-09-23 DIAGNOSIS — I2510 Atherosclerotic heart disease of native coronary artery without angina pectoris: Secondary | ICD-10-CM

## 2017-09-23 DIAGNOSIS — E1122 Type 2 diabetes mellitus with diabetic chronic kidney disease: Secondary | ICD-10-CM

## 2017-09-23 DIAGNOSIS — Z79899 Other long term (current) drug therapy: Secondary | ICD-10-CM

## 2017-09-23 DIAGNOSIS — D631 Anemia in chronic kidney disease: Secondary | ICD-10-CM | POA: Diagnosis not present

## 2017-09-23 DIAGNOSIS — E785 Hyperlipidemia, unspecified: Secondary | ICD-10-CM

## 2017-09-23 DIAGNOSIS — Z794 Long term (current) use of insulin: Secondary | ICD-10-CM

## 2017-09-23 DIAGNOSIS — F419 Anxiety disorder, unspecified: Secondary | ICD-10-CM

## 2017-09-23 DIAGNOSIS — Z7982 Long term (current) use of aspirin: Secondary | ICD-10-CM

## 2017-09-23 DIAGNOSIS — D638 Anemia in other chronic diseases classified elsewhere: Secondary | ICD-10-CM

## 2017-09-23 DIAGNOSIS — J449 Chronic obstructive pulmonary disease, unspecified: Secondary | ICD-10-CM

## 2017-09-23 DIAGNOSIS — E559 Vitamin D deficiency, unspecified: Secondary | ICD-10-CM

## 2017-09-23 DIAGNOSIS — M8580 Other specified disorders of bone density and structure, unspecified site: Secondary | ICD-10-CM

## 2017-09-23 DIAGNOSIS — Z915 Personal history of self-harm: Secondary | ICD-10-CM

## 2017-09-23 DIAGNOSIS — Z9119 Patient's noncompliance with other medical treatment and regimen: Secondary | ICD-10-CM

## 2017-09-23 LAB — CBC WITH DIFFERENTIAL (CANCER CENTER ONLY)
Basophils Absolute: 0 10*3/uL (ref 0.0–0.1)
Basophils Relative: 0 %
Eosinophils Absolute: 0.1 10*3/uL (ref 0.0–0.5)
Eosinophils Relative: 2 %
HCT: 34.4 % — ABNORMAL LOW (ref 34.8–46.6)
Hemoglobin: 11.2 g/dL — ABNORMAL LOW (ref 11.6–15.9)
Lymphocytes Relative: 15 %
Lymphs Abs: 1.3 10*3/uL (ref 0.9–3.3)
MCH: 29.2 pg (ref 25.1–34.0)
MCHC: 32.6 g/dL (ref 31.5–36.0)
MCV: 89.6 fL (ref 79.5–101.0)
Monocytes Absolute: 0.6 10*3/uL (ref 0.1–0.9)
Monocytes Relative: 7 %
Neutro Abs: 6.5 10*3/uL (ref 1.5–6.5)
Neutrophils Relative %: 76 %
Platelet Count: 332 10*3/uL (ref 145–400)
RBC: 3.84 MIL/uL (ref 3.70–5.45)
RDW: 15.8 % — ABNORMAL HIGH (ref 11.2–14.5)
WBC Count: 8.5 10*3/uL (ref 3.9–10.3)

## 2017-09-23 NOTE — Progress Notes (Deleted)
Pt did not meet parameters to receive injection. Hgb is 11.2 today

## 2017-09-23 NOTE — Progress Notes (Signed)
[facility] [phone]   [phone]  OFFICE PROGRESS NOTE  [name], NP-C [place] [address]  DIAGNOSIS: Anemia of chronic disease secondary to chronic renal insufficiency  PRIOR THERAPY: None  CURRENT THERAPY: Aranesp 300 mcg subcutaneously every 3 weeks.  INTERVAL HISTORY: Courtney Goodman 72 y.o. female returns to the clinic today for returns to the clinic today for follow-up visit accompanied by her daughter.  The patient is feeling fine today with no specific complaints except for mild fatigue.  She denied having any dizzy spells.  She has no chest pain, shortness of breath, cough or hemoptysis.  She has no fever or chills.  She was a started on treatment with Aranesp injection every 3 weeks and has been tolerating it fairly well.  She is here today for evaluation and repeat blood work.  MEDICAL HISTORY: Past Medical History:  Diagnosis Date  . Anemia of chronic disease   . Asthma   . Atherosclerotic heart disease of native coronary artery without angina pectoris   . CKD (chronic kidney disease)   . COPD (chronic obstructive pulmonary disease) ([place])   . DM (diabetes mellitus), type 2 with renal complications ([place])   . Dysrhythmia   . Elevated ferritin 01/03/2017  . GAD (generalized anxiety disorder)   . Hypertension   . Mixed hyperlipidemia due to type 2 diabetes mellitus ([place])   . Mixed incontinence    per medical records from [place]  . Osteopenia   . Personal history of noncompliance with medical treatment, presenting hazards to health   . Shortness of breath dyspnea   . Suicidal ideation   . TB (tuberculosis), treated    age 74  . Vitamin D deficiency     ALLERGIES:  has No Known Allergies.  MEDICATIONS:  Current Outpatient Medications  Medication Sig Dispense Refill  . albuterol (PROVENTIL HFA;VENTOLIN HFA) 108 (90 Base) MCG/ACT inhaler Inhale 2 puffs into the lungs every 6 (six) hours as  needed for wheezing or shortness of breath. 1 Inhaler 0  . amLODipine-benazepril (LOTREL) 5-20 MG capsule Take 1 capsule by mouth daily. 90 capsule 0  . aspirin EC 81 MG tablet Take 81 mg by mouth daily.     . Biotin 10000 MCG TABS Take 1 tablet by mouth daily.     . Blood Glucose Monitoring Suppl (ACCU-CHEK AVIVA PLUS) w/Device KIT Test twice a day. Dx code E11.9 1 kit 0  . carvedilol (COREG) 6.25 MG tablet Take 1 tablet by mouth twice daily. 90 tablet 0  . cholecalciferol (VITAMIN D) 1000 units tablet Take 1 tablet by mouth daily. 30 tablet 5  . citalopram (CELEXA) 20 MG tablet Take 1 tablet (20 mg total) by mouth daily. 90 tablet 1  . fenofibrate (TRICOR) 145 MG tablet Take 1 tablet (145 mg total) by mouth daily. 90 tablet 1  . fluticasone (FLONASE) 50 MCG/ACT nasal spray Place 2 sprays into both nostrils daily. 48 g 0  . folic acid (FOLVITE) 1 MG tablet Take 1 tablet (1 mg total) by mouth daily. 90 tablet 1  . Insulin Detemir (LEVEMIR FLEXTOUCH) 100 UNIT/ML Pen Inject 140 Units into the skin every morning. 45 pen 3  . liraglutide (VICTOZA) 18 MG/3ML SOPN Inject 1.8 mg subcutaneously daily. 3 pen 11  . omeprazole (PRILOSEC) 40 MG capsule Take 1 capsule (40 mg total) by mouth daily. 90 capsule 1  . simvastatin (ZOCOR) 20 MG tablet Take 1 tablet by  mouth every night. 90 tablet 1  . traMADol (ULTRAM) 50 MG tablet Take 1 tablet by mouth daily as needed. (Patient taking differently: Take 1 tablet by mouth daily as needed. for pain) 90 tablet 0  . traZODone (DESYREL) 50 MG tablet Take 50 mg by mouth at bedtime. Patient gets this from her therapist, not PCP    . TRUE METRIX BLOOD GLUCOSE TEST test strip USE AS DIRECTED THREE TIMES A DAY 100 each 2   No current facility-administered medications for this visit.     SURGICAL HISTORY:  Past Surgical History:  Procedure Laterality Date  . ANKLE RECONSTRUCTION     right  . CARPAL TUNNEL RELEASE    . CORONARY ANGIOPLASTY      REVIEW OF SYSTEMS:   A comprehensive review of systems was negative except for: Constitutional: positive for fatigue   PHYSICAL EXAMINATION: General appearance: alert, cooperative, fatigued and no distress Head: Normocephalic, without obvious abnormality, atraumatic Neck: no adenopathy, no JVD, supple, symmetrical, trachea midline and thyroid not enlarged, symmetric, no tenderness/mass/nodules Lymph nodes: Cervical, supraclavicular, and axillary nodes normal. Resp: clear to auscultation bilaterally Back: symmetric, no curvature. ROM normal. No CVA tenderness. Cardio: regular rate and rhythm, S1, S2 normal, no murmur, click, rub or gallop GI: soft, non-tender; bowel sounds normal; no masses,  no organomegaly Extremities: extremities normal, atraumatic, no cyanosis or edema  ECOG PERFORMANCE STATUS: 1 - Symptomatic but completely ambulatory  Blood pressure (!) 157/91, pulse (!) 108, temperature 98.4 F (36.9 C), temperature source Oral, resp. rate 18, height 5' 4"  (1.626 m), weight 174 lb 8 oz (79.2 kg), SpO2 100 %.  LABORATORY DATA: Lab Results  Component Value Date   WBC 8.5 09/23/2017   HGB 11.2 (L) 09/23/2017   HCT 34.4 (L) 09/23/2017   MCV 89.6 09/23/2017   PLT 332 09/23/2017      Chemistry      Component Value Date/Time   NA 139 08/24/2017 0629   NA 146 (H) 05/07/2017 1147   NA 137 05/29/2011 1946   K 4.4 08/24/2017 0629   K 3.9 05/29/2011 1946   CL 103 08/24/2017 0629   CL 100 05/29/2011 1946   CO2 30 08/24/2017 0629   CO2 28 05/29/2011 1946   BUN 33 (H) 08/24/2017 0629   BUN 28 (H) 05/07/2017 1147   BUN 24 (H) 05/29/2011 1946   CREATININE 2.06 (H) 08/24/2017 0629   CREATININE 1.84 (H) 06/25/2017 1437   CREATININE 1.56 (H) 01/01/2017 0941      Component Value Date/Time   CALCIUM 9.3 08/24/2017 0629   CALCIUM 9.8 05/29/2011 1946   ALKPHOS 45 08/23/2017 0043   ALKPHOS 86 05/29/2011 1946   AST 24 08/23/2017 0043   AST 17 06/25/2017 1437   ALT 14 08/23/2017 0043   ALT 9 06/25/2017  1437   ALT 15 05/29/2011 1946   BILITOT 0.7 08/23/2017 0043   BILITOT 0.2 06/25/2017 1437       RADIOGRAPHIC STUDIES: No results found.  ASSESSMENT AND PLAN: This is a very pleasant 72 years old African-American female with anemia of chronic disease secondary to chronic renal insufficiency.  The patient is currently undergoing treatment with Aranesp injections 300 mcg subcutaneously every 3 weeks and has been tolerating it well.  She will continue this treatment. I also recommended for the patient to continue on oral iron tablets at regular basis. I will see her back for follow-up visit in 3 months for evaluation with repeat CBC, iron study and ferritin. Patient  was advised to call immediately if she has any concerning symptoms in the interval. The patient voices understanding of current disease status and treatment options and is in agreement with the current care plan.  All questions were answered. The patient knows to call the clinic with any problems, questions or concerns. We can certainly see the patient much sooner if necessary.  I spent 10 minutes counseling the patient face to face. The total time spent in the appointment was 15 minutes.  Disclaimer: This note was dictated with voice recognition software. Similar sounding words can inadvertently be transcribed and may not be corrected upon review.

## 2017-09-24 ENCOUNTER — Telehealth: Payer: Self-pay | Admitting: Emergency Medicine

## 2017-09-24 NOTE — Telephone Encounter (Signed)
error 

## 2017-09-25 ENCOUNTER — Other Ambulatory Visit: Payer: Self-pay

## 2017-09-25 ENCOUNTER — Telehealth: Payer: Self-pay | Admitting: Internal Medicine

## 2017-09-25 MED ORDER — INSULIN PEN NEEDLE 31G X 5 MM MISC: 1 refills | Status: DC

## 2017-09-25 NOTE — Telephone Encounter (Signed)
Scheduled appt per 8/27 los - sent reminder letter in the mail - pt aware of appts added.

## 2017-09-29 ENCOUNTER — Other Ambulatory Visit: Payer: Self-pay | Admitting: Family Medicine

## 2017-09-30 ENCOUNTER — Other Ambulatory Visit: Payer: Self-pay | Admitting: *Deleted

## 2017-09-30 NOTE — Patient Outreach (Signed)
[facility]) Care Management   09/30/2017  Courtney Goodman 1945/08/10 [id number]  Courtney Goodman is an 72 y.o. female  Subjective:   Member alert and oriented x3, denies any pain or discomfort at this time.  Denies any hypoglycemic episodes, report daughter has been helping manage her health care.  State she remains compliant with medications, including insulin.  Objective:   Review of Systems  Constitutional: Negative.   HENT: Negative.   Eyes: Negative.   Respiratory: Negative.   Cardiovascular: Negative.   Gastrointestinal: Negative.   Genitourinary: Negative.   Musculoskeletal: Negative.   Skin: Negative.   Neurological: Negative.   Endo/Heme/Allergies: Negative.   Psychiatric/Behavioral: Negative.     Physical Exam  Constitutional: She is oriented to person, place, and time. She appears well-developed and well-nourished.  Neck: Normal range of motion.  Cardiovascular: Normal rate, regular rhythm and normal heart sounds.  Respiratory: Effort normal and breath sounds normal.  GI: Soft. Bowel sounds are normal.  Musculoskeletal: Normal range of motion.  Neurological: She is alert and oriented to person, place, and time.  Skin: Skin is warm and dry.   BP 128/62   Pulse 71   Resp 20   SpO2 98%   Encounter Medications:   Outpatient Encounter Medications as of 09/30/2017  Medication Sig Note  . albuterol (PROVENTIL HFA;VENTOLIN HFA) 108 (90 Base) MCG/ACT inhaler Inhale 2 puffs into the lungs every 6 (six) hours as needed for wheezing or shortness of breath.   Marland Kitchen amLODipine-benazepril (LOTREL) 5-20 MG capsule Take 1 capsule by mouth daily.   Marland Kitchen aspirin EC 81 MG tablet Take 81 mg by mouth daily.    . Biotin 10000 MCG TABS Take 1 tablet by mouth daily.    . Blood Glucose Monitoring Suppl (ACCU-CHEK AVIVA PLUS) w/Device KIT Test twice a day. Dx code E11.9   . carvedilol (COREG) 6.25 MG tablet Take 1 tablet by mouth twice daily.   . cholecalciferol (VITAMIN  D) 1000 units tablet Take 1 tablet by mouth daily. 08/23/2017: Has not started   . citalopram (CELEXA) 20 MG tablet Take 1 tablet (20 mg total) by mouth daily.   . fenofibrate (TRICOR) 145 MG tablet Take 1 tablet (145 mg total) by mouth daily.   . fluticasone (FLONASE) 50 MCG/ACT nasal spray Place 2 sprays into both nostrils daily.   . folic acid (FOLVITE) 1 MG tablet Take 1 tablet (1 mg total) by mouth daily.   . Insulin Detemir (LEVEMIR FLEXTOUCH) 100 UNIT/ML Pen Inject 140 Units into the skin every morning.   . Insulin Pen Needle 31G X 5 MM MISC Use to inject insulin three times daily   . liraglutide (VICTOZA) 18 MG/3ML SOPN Inject 1.8 mg subcutaneously daily.   Marland Kitchen omeprazole (PRILOSEC) 40 MG capsule Take 1 capsule (40 mg total) by mouth daily.   . simvastatin (ZOCOR) 20 MG tablet Take 1 tablet by mouth every night.   . traMADol (ULTRAM) 50 MG tablet Take 1 tablet by mouth daily as needed. (Patient taking differently: Take 1 tablet by mouth daily as needed. for pain)   . traZODone (DESYREL) 50 MG tablet Take 50 mg by mouth at bedtime. Patient gets this from her therapist, not PCP   . TRUE METRIX BLOOD GLUCOSE TEST test strip USE AS DIRECTED THREE TIMES A DAY    No facility-administered encounter medications on file as of 09/30/2017.     Functional Status:   In your present state of health, do you have  any difficulty performing the following activities: 09/02/2017 08/23/2017  Hearing? [name]  Vision? Y N  Difficulty concentrating or making decisions? [name]  Walking or climbing stairs? Y Y  Dressing or bathing? N N  Doing errands, shopping? Y N  Preparing Food and eating ? Y -  Using the Toilet? N -  In the past six months, have you accidently leaked urine? Y -  Do you have problems with loss of bowel control? N -  Managing your Medications? N -  Managing your Finances? N -  Housekeeping or managing your Housekeeping? Y -  Some recent data might be hidden    Fall/Depression Screening:     Fall Risk  09/02/2017 05/07/2017 09/19/2016  Falls in the past year? Yes No No  Number falls in past yr: 2 or more - -  Injury with Fall? No - -  Risk Factor Category  High Fall Risk - -  Risk for fall due to : History of fall(s);Impaired vision - -  Follow up Falls prevention discussed - -   PHQ 2/9 Scores 09/02/2017 08/26/2017 05/07/2017 01/01/2017 09/19/2016 07/02/2016 07/01/2016  PHQ - 2 Score 4 1 0 0 1 2 2   PHQ- 9 Score 12 - - - - 4 4    Assessment:    Met with member at scheduled time.  Daughter present for visit.  They both express concern regarding member's stress level, which may be contributing to elevated blood sugars (range high 100s-low 200s).  State member's grandson remains in the home, they are trying to get member away from him.  They have been working with [facility] LCSW regarding this concern.  Provided member with Living Well with Diabetes book as she was interested in learning more about diet.  State she will take book with her to the grocery store, daughter report she follows appropriate diet when bring food into the home.  Member also expresses concern regarding being home alone and risk of hypoglycemia.  Request to have life alert system.  Call made to insurance company, daughter aware that package will be delivered within the next week.    Both member and daughter deny any urgent concerns, advised to contact with questions.  Plan:   Will follow up within the next 2 weeks regarding life alert system.  Will plan home visit within the next month.  [facility] CM Care Plan Problem One     Most Recent Value  Care Plan Problem One  Risk for hospitalization related to complications of diabetes as evidenced by recent hospitalization  Role Documenting the Problem One  Care Management [place] for Problem One  Active  [facility] Long Term Goal   Member will not be readmitted to hospital within 31 days of discharge  [facility] Long Term Goal Start Date  09/02/17  [facility] Long Term Goal Met Date   09/30/17  [facility] CM Short Term Goal #1   Member will not have any hypoglycemic episodes within the next 4 weeks  [facility] CM Short Term Goal #1 Start Date  09/02/17  [facility] CM Short Term Goal #1 Met Date  09/30/17  [facility] CM Short Term Goal #2   Member will have follow up appointment with primary MD within the next 4 weeks  [facility] CM Short Term Goal #2 Start Date  09/02/17  [facility] CM Short Term Goal #2 Met Date  09/30/17  [facility] CM Short Term Goal #3  Member will receive life alert system and have it set up  within the next 4 weeks  THN CM Short Term Goal #3 Start Date  09/30/17  [facility] Goal #3  Call placed to insurance company, life alert ordered.  Daughter educated on process of setting up system.     [name], [place], MSN [place] [phone]

## 2017-10-13 ENCOUNTER — Other Ambulatory Visit: Payer: Self-pay | Admitting: *Deleted

## 2017-10-13 NOTE — Patient Outreach (Signed)
[facility]) Care Management  10/13/2017  Dannielle Baskins [place] 02-12-1945 [id number]   Call placed to member to follow up on arrival of life alert system.  State her daughter has received it and will call to connect.  Denies any urgent concerns at this time, advised to contact this care manager with questions.  Will follow up within the next month.  If remain stable, will transition to health coach.  THN CM Care Plan Problem One     Most Recent Value  Care Plan Problem One  Risk for hospitalization related to complications of diabetes as evidenced by recent hospitalization  Role Documenting the Problem One  Care Management [place] for Problem One  Not Active  THN CM Short Term Goal #3  Member will receive life alert system and have it set up within the next 4 weeks  TH[facility] CM Short Term Goal #3 Start Date  09/30/17  [facility] CM Short Term Goal #3 Met Date  10/13/17     [name], RN, MSN [place] [phone]

## 2017-10-14 ENCOUNTER — Inpatient Hospital Stay: Payer: Medicare HMO

## 2017-10-14 ENCOUNTER — Inpatient Hospital Stay: Payer: Medicare HMO | Attending: Oncology

## 2017-10-14 ENCOUNTER — Telehealth: Payer: Self-pay | Admitting: Internal Medicine

## 2017-10-14 NOTE — Telephone Encounter (Signed)
Patient called to reschedule however MD said that she could just come to next one, no need to reschedule

## 2017-10-17 ENCOUNTER — Encounter: Payer: Self-pay | Admitting: Podiatry

## 2017-10-17 ENCOUNTER — Ambulatory Visit (INDEPENDENT_AMBULATORY_CARE_PROVIDER_SITE_OTHER): Payer: Medicare HMO | Admitting: Podiatry

## 2017-10-17 DIAGNOSIS — Z9229 Personal history of other drug therapy: Secondary | ICD-10-CM

## 2017-10-17 DIAGNOSIS — M79675 Pain in left toe(s): Secondary | ICD-10-CM | POA: Diagnosis not present

## 2017-10-17 DIAGNOSIS — E1122 Type 2 diabetes mellitus with diabetic chronic kidney disease: Secondary | ICD-10-CM | POA: Diagnosis not present

## 2017-10-17 DIAGNOSIS — M79674 Pain in right toe(s): Secondary | ICD-10-CM

## 2017-10-17 DIAGNOSIS — Z794 Long term (current) use of insulin: Secondary | ICD-10-CM

## 2017-10-17 DIAGNOSIS — N183 Chronic kidney disease, stage 3 (moderate): Secondary | ICD-10-CM

## 2017-10-17 DIAGNOSIS — B351 Tinea unguium: Secondary | ICD-10-CM

## 2017-10-22 ENCOUNTER — Telehealth: Payer: Self-pay | Admitting: Family Medicine

## 2017-10-23 ENCOUNTER — Encounter: Payer: Self-pay | Admitting: Podiatry

## 2017-10-23 NOTE — Progress Notes (Signed)
Subjective: Courtney Goodman presents today for preventative foot care with diabetes and painful, mycotic toenails which interfere with daily activities and routine tasks.  Pain is aggravated when wearing enclosed shoe gear. Pain is relieved with periodic professional debridement.  Ms. Yim relates no new episodes of trauma and no pedal wounds.  She continues to take aspirin as blood thinner daily.  For diabetes, she is on Victoza and Levemir  A1c: 6.7 one month ago  Objective: Vascular Examination: Capillary refill time <3 seconds x 10 digits Dorsalis pedis and posterior tibial pulses present b/l No digital hair x 10 digits Skin temperature warm to warm b/l  Dermatological Examination: Skin thin and atrophic b/l Toenails 1-5 b/l discolored, thick, dystrophic with subungual debris and pain with palpation to nailbeds due to thickness of nails. No interdigital maceration noted b/l No  open wounds b/l  Musculoskeletal: Muscle strength 5/5 to all LE muscle groups  Neurological: Sensation intact with 10 gram monofilament. Vibratory sensation intact.  Assessment: Painful onychomycosis toenails 1-5 b/l  Long term use of blood thinner NIDDM  Plan: 1. Continue diabetic foot care principles. Continue diabetic diet per PCP/dietician recommendations. 2. Toenails 1-5 b/l were debrided in length and girth without iatrogenic bleeding. 3. Patient to continue soft, supportive shoe gear 4. Patient to report any pedal injuries to medical professional immediately. 5. Follow up 3 months. Patient/POA to call should there be a concern in the interim.

## 2017-10-23 NOTE — Telephone Encounter (Signed)
dt ?

## 2017-10-28 ENCOUNTER — Encounter: Payer: Self-pay | Admitting: Endocrinology

## 2017-10-28 ENCOUNTER — Ambulatory Visit (INDEPENDENT_AMBULATORY_CARE_PROVIDER_SITE_OTHER): Payer: Medicare HMO | Admitting: Endocrinology

## 2017-10-28 VITALS — BP 122/72 | HR 68 | Ht 64.0 in | Wt 175.4 lb

## 2017-10-28 DIAGNOSIS — Z794 Long term (current) use of insulin: Secondary | ICD-10-CM | POA: Diagnosis not present

## 2017-10-28 DIAGNOSIS — N183 Chronic kidney disease, stage 3 unspecified: Secondary | ICD-10-CM

## 2017-10-28 DIAGNOSIS — E1122 Type 2 diabetes mellitus with diabetic chronic kidney disease: Secondary | ICD-10-CM | POA: Diagnosis not present

## 2017-10-28 LAB — POCT GLYCOSYLATED HEMOGLOBIN (HGB A1C): Hemoglobin A1C: 7.9 % — AB (ref 4.0–5.6)

## 2017-10-28 NOTE — Progress Notes (Signed)
Subjective:    Patient ID: Courtney Goodman, female    DOB: 1945-08-13, 72 y.o.   MRN: [id number]  HPI Pt returns for f/u of diabetes mellitus: DM type: Insulin-requiring type 2 Dx'ed: 7829 Complications: renal insufficiency and CAD Therapy: insulin since 2009, and victoza. GDM: never DKA: never Severe hypoglycemia: 2012 and 2019. Pancreatitis: never.  Other: she is on qd insulin, due to h/o noncompliance.   Interval history:  She says she takes insulin as rx'ed.  She says cbg varies from 50-200's.  pt states she feels well in general.  There is no trend throughout the day. Past Medical History:  Diagnosis Date  . Anemia of chronic disease   . Asthma   . Atherosclerotic heart disease of native coronary artery without angina pectoris   . CKD (chronic kidney disease)   . COPD (chronic obstructive pulmonary disease) ([place])   . DM (diabetes mellitus), type 2 with renal complications ([place])   . Dysrhythmia   . Elevated ferritin 01/03/2017  . GAD (generalized anxiety disorder)   . Hypertension   . Mixed hyperlipidemia due to type 2 diabetes mellitus ([place])   . Mixed incontinence    per medical records from [place]  . Osteopenia   . Personal history of noncompliance with medical treatment, presenting hazards to health   . Shortness of breath dyspnea   . Suicidal ideation   . TB (tuberculosis), treated    age 42  . Vitamin D deficiency     Past Surgical History:  Procedure Laterality Date  . ANKLE RECONSTRUCTION     right  . CARPAL TUNNEL RELEASE    . CORONARY ANGIOPLASTY      Social History   Socioeconomic History  . Marital status: Divorced    Spouse name: Not on file  . Number of children: Not on file  . Years of education: Not on file  . Highest education level: Not on file  Occupational History  . Not on file  Social Needs  . Financial resource strain: Not on file  . Food insecurity:    Worry: Not on file    Inability: Not on file  . Transportation needs:   Medical: Not on file    Non-medical: Not on file  Tobacco Use  . Smoking status: Former Research scientist (life sciences)  . Smokeless tobacco: Never Used  Substance and Sexual Activity  . Alcohol use: No  . Drug use: No  . Sexual activity: Never  Lifestyle  . Physical activity:    Days per week: Not on file    Minutes per session: Not on file  . Stress: Not on file  Relationships  . Social connections:    Talks on phone: Not on file    Gets together: Not on file    Attends religious service: Not on file    Active member of club or organization: Not on file    Attends meetings of clubs or organizations: Not on file    Relationship status: Not on file  . Intimate partner violence:    Fear of current or ex partner: Not on file    Emotionally abused: Not on file    Physically abused: Not on file    Forced sexual activity: Not on file  Other Topics Concern  . Not on file  Social History Narrative  . Not on file    Current Outpatient Medications on File Prior to Visit  Medication Sig Dispense Refill  . albuterol (PROVENTIL HFA;VENTOLIN HFA) 108 (90 Base)  MCG/ACT inhaler Inhale 2 puffs into the lungs every 6 (six) hours as needed for wheezing or shortness of breath. 1 Inhaler 0  . amLODipine-benazepril (LOTREL) 5-20 MG capsule Take 1 capsule by mouth daily. 90 capsule 0  . aspirin EC 81 MG tablet Take 81 mg by mouth daily.     . Biotin 10000 MCG TABS Take 1 tablet by mouth daily.     . Blood Glucose Monitoring Suppl (ACCU-CHEK AVIVA PLUS) w/Device KIT Test twice a day. Dx code E11.9 1 kit 0  . carvedilol (COREG) 6.25 MG tablet Take 1 tablet by mouth twice daily. 180 tablet 0  . citalopram (CELEXA) 20 MG tablet Take 1 tablet (20 mg total) by mouth daily. 90 tablet 1  . fenofibrate (TRICOR) 145 MG tablet Take 1 tablet (145 mg total) by mouth daily. 90 tablet 1  . fluticasone (FLONASE) 50 MCG/ACT nasal spray Place 2 sprays into both nostrils daily. 48 g 0  . folic acid (FOLVITE) 1 MG tablet Take 1 tablet (1  mg total) by mouth daily. 90 tablet 1  . Insulin Detemir (LEVEMIR FLEXTOUCH) 100 UNIT/ML Pen Inject 140 Units into the skin every morning. 45 pen 3  . Insulin Pen Needle 31G X 5 MM MISC Use to inject insulin three times daily 100 each 1  . liraglutide (VICTOZA) 18 MG/3ML SOPN Inject 1.8 mg subcutaneously daily. 3 pen 11  . omeprazole (PRILOSEC) 40 MG capsule Take 1 capsule (40 mg total) by mouth daily. 90 capsule 1  . simvastatin (ZOCOR) 20 MG tablet Take 1 tablet by mouth every night. 90 tablet 1  . traMADol (ULTRAM) 50 MG tablet Take 1 tablet by mouth daily as needed. (Patient taking differently: Take 1 tablet by mouth daily as needed. for pain) 90 tablet 0  . traZODone (DESYREL) 50 MG tablet Take 50 mg by mouth at bedtime. Patient gets this from her therapist, not PCP    . TRUE METRIX BLOOD GLUCOSE TEST test strip USE AS DIRECTED THREE TIMES A DAY 100 each 2   No current facility-administered medications on file prior to visit.     No Known Allergies  Family History  Problem Relation Age of Onset  . Diabetes Mother     BP 122/72 (BP Location: Left Arm)   Pulse 68   Ht 5' 4"  (1.626 m)   Wt 175 lb 6.4 oz (79.6 kg)   SpO2 99%   BMI 30.11 kg/m    Review of Systems Denies LOC.      Objective:   Physical Exam VITAL SIGNS:  See vs page GENERAL: no distress Pulses: foot pulses are intact bilaterally.   MSK: no deformity of the feet or ankles.  CV: no edema of the legs or ankles Skin:  no ulcer on the feet or ankles.  normal color and temp on the feet and ankles Neuro: sensation is intact to touch on the feet and ankles.    Lab Results  Component Value Date   CREATININE 2.06 (H) 08/24/2017   BUN 33 (H) 08/24/2017   NA 139 08/24/2017   K 4.4 08/24/2017   CL 103 08/24/2017   CO2 30 08/24/2017    Lab Results  Component Value Date   HGBA1C 7.9 (A) 10/28/2017      Assessment & Plan:  Insulin-requiring type 2 DM, with CAD: this is the best control this pt should aim for,  given this regimen, which does match insulin to her changing needs throughout the day Hypoglycemia: this  limits aggressiveness of glycemic control. Renal failure: levemir is better in this situation than lantus.  Patient Instructions  check your blood sugar twice a day.  vary the time of day when you check, between before the 3 meals, and at bedtime.  also check if you have symptoms of your blood sugar being too high or too low.  please keep a record of the readings and bring it to your next appointment here (or you can bring the meter itself).  You can write it on any piece of paper.  please call us sooner if your blood sugar goes below 70, or if you have a lot of readings over 200.   Please continue the same insulin.  On this type of insulin schedule, you should eat meals on a regular schedule.  If a meal is missed or significantly delayed, your blood sugar could go low.   Please come back for a follow-up appointment in 4 months.

## 2017-10-28 NOTE — Patient Instructions (Addendum)
check your blood sugar twice a day.  vary the time of day when you check, between before the 3 meals, and at bedtime.  also check if you have symptoms of your blood sugar being too high or too low.  please keep a record of the readings and bring it to your next appointment here (or you can bring the meter itself).  You can write it on any piece of paper.  please call us sooner if your blood sugar goes below 70, or if you have a lot of readings over 200.   Please continue the same insulin.    On this type of insulin schedule, you should eat meals on a regular schedule.  If a meal is missed or significantly delayed, your blood sugar could go low.   Please come back for a follow-up appointment in 4 months.    

## 2017-10-29 ENCOUNTER — Other Ambulatory Visit: Payer: Self-pay | Admitting: Family Medicine

## 2017-10-30 DIAGNOSIS — F419 Anxiety disorder, unspecified: Secondary | ICD-10-CM | POA: Diagnosis not present

## 2017-10-30 DIAGNOSIS — F330 Major depressive disorder, recurrent, mild: Secondary | ICD-10-CM | POA: Diagnosis not present

## 2017-10-31 ENCOUNTER — Other Ambulatory Visit: Payer: Self-pay | Admitting: *Deleted

## 2017-10-31 NOTE — Patient Outreach (Signed)
[place] [facility]) Care Management  10/31/2017  Courtney Goodman [birth year] [id number]   Call placed to member to follow up on current health status, no answer.  HIPAA compliant voice message left, will follow up within the next 4 business days.  [name], [place], MSN [place] [phone]

## 2017-11-03 ENCOUNTER — Ambulatory Visit: Payer: Medicare HMO | Admitting: Endocrinology

## 2017-11-04 ENCOUNTER — Telehealth: Payer: Self-pay | Admitting: Internal Medicine

## 2017-11-04 ENCOUNTER — Inpatient Hospital Stay: Payer: Medicare HMO

## 2017-11-04 ENCOUNTER — Inpatient Hospital Stay: Payer: Medicare HMO | Attending: Oncology

## 2017-11-04 VITALS — BP 142/58 | HR 71 | Temp 97.9°F | Resp 18

## 2017-11-04 DIAGNOSIS — Z79899 Other long term (current) drug therapy: Secondary | ICD-10-CM | POA: Insufficient documentation

## 2017-11-04 DIAGNOSIS — N189 Chronic kidney disease, unspecified: Secondary | ICD-10-CM | POA: Diagnosis not present

## 2017-11-04 DIAGNOSIS — N183 Chronic kidney disease, stage 3 unspecified: Secondary | ICD-10-CM

## 2017-11-04 DIAGNOSIS — D631 Anemia in chronic kidney disease: Secondary | ICD-10-CM | POA: Insufficient documentation

## 2017-11-04 DIAGNOSIS — I129 Hypertensive chronic kidney disease with stage 1 through stage 4 chronic kidney disease, or unspecified chronic kidney disease: Secondary | ICD-10-CM | POA: Diagnosis not present

## 2017-11-04 DIAGNOSIS — D638 Anemia in other chronic diseases classified elsewhere: Secondary | ICD-10-CM

## 2017-11-04 LAB — CMP (CANCER CENTER ONLY)
ALT: 9 U/L (ref 0–44)
AST: 15 U/L (ref 15–41)
Albumin: 3.6 g/dL (ref 3.5–5.0)
Alkaline Phosphatase: 52 U/L (ref 38–126)
Anion gap: 7 (ref 5–15)
BUN: 25 mg/dL — ABNORMAL HIGH (ref 8–23)
CO2: 28 mmol/L (ref 22–32)
Calcium: 10 mg/dL (ref 8.9–10.3)
Chloride: 106 mmol/L (ref 98–111)
Creatinine: 1.56 mg/dL — ABNORMAL HIGH (ref 0.44–1.00)
GFR, Est AFR Am: 37 mL/min — ABNORMAL LOW (ref >60)
GFR, Estimated: 32 mL/min — ABNORMAL LOW (ref >60)
Glucose, Bld: 224 mg/dL — ABNORMAL HIGH (ref 70–99)
Potassium: 4.4 mmol/L (ref 3.5–5.1)
Sodium: 141 mmol/L (ref 135–145)
Total Bilirubin: 0.4 mg/dL (ref 0.3–1.2)
Total Protein: 8.1 g/dL (ref 6.5–8.1)

## 2017-11-04 LAB — IRON AND TIBC
Iron: 72 ug/dL (ref 41–142)
Saturation Ratios: 21 % (ref 21–57)
TIBC: 338 ug/dL (ref 236–444)
UIBC: 265 ug/dL

## 2017-11-04 LAB — CBC WITH DIFFERENTIAL (CANCER CENTER ONLY)
Basophils Absolute: 0 10*3/uL (ref 0.0–0.1)
Basophils Relative: 0 %
Eosinophils Absolute: 0.2 10*3/uL (ref 0.0–0.5)
Eosinophils Relative: 2 %
HCT: 31.6 % — ABNORMAL LOW (ref 34.8–46.6)
Hemoglobin: 10 g/dL — ABNORMAL LOW (ref 12.0–15.0)
Lymphocytes Relative: 21 %
Lymphs Abs: 1.7 10*3/uL (ref 0.9–3.3)
MCH: 28.9 pg (ref 25.1–34.0)
MCHC: 31.6 g/dL (ref 31.5–36.0)
MCV: 91.3 fL (ref 79.5–101.0)
Monocytes Absolute: 0.6 10*3/uL (ref 0.1–0.9)
Monocytes Relative: 7 %
Neutro Abs: 5.7 10*3/uL (ref 1.5–6.5)
Neutrophils Relative %: 70 %
Platelet Count: 310 10*3/uL (ref 150–400)
RBC: 3.46 MIL/uL — ABNORMAL LOW (ref 3.70–5.45)
RDW: 15.8 % — ABNORMAL HIGH (ref 11.2–14.5)
WBC Count: 8.3 10*3/uL (ref 4.0–10.5)
nRBC: 0 % (ref 0.0–0.2)

## 2017-11-04 LAB — FERRITIN: Ferritin: 305 ng/mL (ref 11–307)

## 2017-11-04 MED ORDER — EPOETIN ALFA-EPBX 40000 UNIT/ML IJ SOLN
40000.0000 [IU] | Freq: Once | INTRAMUSCULAR | Status: AC
  Administered 2017-11-04: 40000 [IU] via SUBCUTANEOUS
  Filled 2017-11-04: qty 1

## 2017-11-04 MED ORDER — EPOETIN ALFA 40000 UNIT/ML IJ SOLN
INTRAMUSCULAR | Status: AC
  Filled 2017-11-04: qty 1

## 2017-11-04 NOTE — Telephone Encounter (Signed)
Faxed medical records to [place], Release ID: [id number]

## 2017-11-05 ENCOUNTER — Other Ambulatory Visit: Payer: Self-pay | Admitting: *Deleted

## 2017-11-05 NOTE — Patient Outreach (Signed)
[facility]) Care Management  11/05/2017  Courtney Goodman 07/30/1945 [id number]   Call placed to member to follow up on current health status.  Report she is doing well, blood sugars are stable, denies any hypoglycemic episodes.  State reading this morning was 120.  She report she has received her life alert system and daughter has connected it, verified it is working.  Remain on the wait list for her desired apartment, still looking to relocate.  Denies any urgent concerns at this time.  Discussed transition to health coach for continued disease management, she agrees.    Will send referral to health coach, will notify primary MD of transition.  [name], [place], MSN [place] [phone]

## 2017-11-10 ENCOUNTER — Encounter: Payer: Self-pay | Admitting: *Deleted

## 2017-11-17 ENCOUNTER — Telehealth: Payer: Self-pay | Admitting: Family Medicine

## 2017-11-17 NOTE — Telephone Encounter (Signed)
  Patient called and stated that her kidney doctor was leaving town and she needs new kidney doctor. Advised patient per Vickie to check with Dr. Abel Presto office and see what arrangements they have made for his patients, should be able to see another physician in that office. Dr. Florene Glen is relocating to [facility] but she is not going to [place]. She will call Dr. Abel Presto office

## 2017-11-20 ENCOUNTER — Other Ambulatory Visit: Payer: Self-pay | Admitting: Endocrinology

## 2017-11-20 NOTE — Telephone Encounter (Signed)
Please address refill request 

## 2017-11-21 ENCOUNTER — Other Ambulatory Visit: Payer: Self-pay | Admitting: Family Medicine

## 2017-11-21 DIAGNOSIS — J209 Acute bronchitis, unspecified: Secondary | ICD-10-CM

## 2017-11-22 DIAGNOSIS — R456 Violent behavior: Secondary | ICD-10-CM | POA: Diagnosis not present

## 2017-11-22 DIAGNOSIS — R404 Transient alteration of awareness: Secondary | ICD-10-CM | POA: Diagnosis not present

## 2017-11-22 DIAGNOSIS — E162 Hypoglycemia, unspecified: Secondary | ICD-10-CM | POA: Diagnosis not present

## 2017-11-22 DIAGNOSIS — E161 Other hypoglycemia: Secondary | ICD-10-CM | POA: Diagnosis not present

## 2017-11-24 ENCOUNTER — Ambulatory Visit (INDEPENDENT_AMBULATORY_CARE_PROVIDER_SITE_OTHER): Payer: Medicare HMO | Admitting: Family Medicine

## 2017-11-24 ENCOUNTER — Encounter: Payer: Self-pay | Admitting: Family Medicine

## 2017-11-24 VITALS — BP 128/80 | HR 74 | Temp 98.2°F | Resp 16 | Wt 173.4 lb

## 2017-11-24 DIAGNOSIS — N183 Chronic kidney disease, stage 3 (moderate): Secondary | ICD-10-CM | POA: Diagnosis not present

## 2017-11-24 DIAGNOSIS — J069 Acute upper respiratory infection, unspecified: Secondary | ICD-10-CM | POA: Diagnosis not present

## 2017-11-24 DIAGNOSIS — Z794 Long term (current) use of insulin: Secondary | ICD-10-CM | POA: Diagnosis not present

## 2017-11-24 DIAGNOSIS — E1122 Type 2 diabetes mellitus with diabetic chronic kidney disease: Secondary | ICD-10-CM | POA: Diagnosis not present

## 2017-11-24 DIAGNOSIS — E162 Hypoglycemia, unspecified: Secondary | ICD-10-CM

## 2017-11-24 LAB — GLUCOSE, POCT (MANUAL RESULT ENTRY): POC Glucose: 236 mg/dl — AB (ref 70–99)

## 2017-11-24 NOTE — Patient Instructions (Addendum)
Your symptoms appear to be related to a viral illness. Continue treating your cough and staying well hydrated.   If you get worse then call me or follow up.   Call Dr. Abel Presto office and find out which other nephrologist will be taking your case.   Avoid skipping meals and check your blood sugars 3 times daily.   Call and schedule with Dr. Loanne Drilling if you see any more low blood sugars readings or if your readings are higher than 200 on a regular basis.

## 2017-11-24 NOTE — Progress Notes (Signed)
   Subjective:    Patient ID: Courtney Goodman, female    DOB: 1946/01/25, 72 y.o.   MRN: [id number]  HPI Chief Complaint  Patient presents with  . blood sugars and cold    blood sugars dropping over the weekend and went unconcious, having a cold too on top of that   States her blood sugar dropped into the 30s over the weekend. States she did not eat enough to cover her insulin. States EMS went to her house and gave her glucose and got her sugar back to normal. She refused to go to the hospital. No problems since.  She typically checks her BS 3 times daily.  States yesterday her BS was not lower than 100.  Has appointment with Dr. Loanne Drilling.   Complains of a 4-5 history of rhinorrhea, nasal congestion, and dry cough that is gradually improving. Taking cough syrup and staying hydrated. At least 50% better overall.   Denies fever, chills, dizziness, ear pain, sinus pain, sore throat, chest pain, palpitations, shortness of breath, wheezing, abdominal pain, N/V/D.   States she thinks about moving into assisted living since she is mainly by herself. States her daughter lives there sometimes but is not very helpful.   Reviewed allergies, medications, past medical, surgical, family, and social history.    Review of Systems Pertinent positives and negatives in the history of present illness.     Objective:   Physical Exam BP 128/80   Pulse 74   Temp 98.2 F (36.8 C) (Oral)   Resp 16   Wt 173 lb 6.4 oz (78.7 kg)   SpO2 98%   BMI 29.76 kg/m   Alert and oriented an in no distress. No sinus tenderness. Nares with erythema, mild edema, clear discharge.  Tympanic membranes and canals are normal. Pharyngeal area is erythematous without edema or exudate. Neck is supple without adenopathy or thyromegaly. Cardiac exam shows a regular rhythm without murmurs or gallops. Lungs are clear to auscultation. Skin is warm and dry, no pallor.       Assessment & Plan:  Acute URI  Type 2 diabetes  mellitus with stage 3 chronic kidney disease, with long-term current use of insulin (HCC)  Hypoglycemia  She appears to have a viral URI and is improving. Continue supportive care. Follow up if not back to baseline in a few days or if symptoms worsen.  PCOT glucose 236 and she ate 1 1/2 hours ago. Discussed importance of not skipping meals when taking diabetes medications. She is using the pill pak and doing well with medication compliance. Denies taking too much insulin.  She will follow up with Dr. Loanne Drilling who is managing her diabetes.  Also recommend she contact her nephrology office since she received a letter that Dr. Florene Glen is retiring to see who is taking over his patients.  She is interested in finding out more about assisted living facilities. She will contact Driscoll from [facility], states she has helped her in the past.  Her daughter is with her and I discussed with her daughter the need for her mother to have food in the house so she does not skip meals.  Follow up in 4 weeks.

## 2017-11-25 ENCOUNTER — Inpatient Hospital Stay: Payer: Medicare HMO

## 2017-11-25 ENCOUNTER — Other Ambulatory Visit: Payer: Self-pay | Admitting: Endocrinology

## 2017-11-25 ENCOUNTER — Telehealth: Payer: Self-pay | Admitting: Endocrinology

## 2017-11-25 VITALS — BP 129/71 | HR 77

## 2017-11-25 DIAGNOSIS — N189 Chronic kidney disease, unspecified: Secondary | ICD-10-CM

## 2017-11-25 DIAGNOSIS — N183 Chronic kidney disease, stage 3 unspecified: Secondary | ICD-10-CM

## 2017-11-25 DIAGNOSIS — I129 Hypertensive chronic kidney disease with stage 1 through stage 4 chronic kidney disease, or unspecified chronic kidney disease: Secondary | ICD-10-CM | POA: Diagnosis not present

## 2017-11-25 DIAGNOSIS — D638 Anemia in other chronic diseases classified elsewhere: Secondary | ICD-10-CM

## 2017-11-25 DIAGNOSIS — D631 Anemia in chronic kidney disease: Secondary | ICD-10-CM | POA: Diagnosis not present

## 2017-11-25 DIAGNOSIS — Z79899 Other long term (current) drug therapy: Secondary | ICD-10-CM | POA: Diagnosis not present

## 2017-11-25 LAB — CBC WITH DIFFERENTIAL (CANCER CENTER ONLY)
Abs Immature Granulocytes: 0.02 10*3/uL (ref 0.00–0.07)
Basophils Absolute: 0.1 10*3/uL (ref 0.0–0.1)
Basophils Relative: 1 %
Eosinophils Absolute: 0.2 10*3/uL (ref 0.0–0.5)
Eosinophils Relative: 3 %
HCT: 34.3 % — ABNORMAL LOW (ref 36.0–46.0)
Hemoglobin: 10.8 g/dL — ABNORMAL LOW (ref 12.0–15.0)
Immature Granulocytes: 0 %
Lymphocytes Relative: 29 %
Lymphs Abs: 2.1 10*3/uL (ref 0.7–4.0)
MCH: 29.1 pg (ref 26.0–34.0)
MCHC: 31.5 g/dL (ref 30.0–36.0)
MCV: 92.5 fL (ref 80.0–100.0)
Monocytes Absolute: 0.5 10*3/uL (ref 0.1–1.0)
Monocytes Relative: 7 %
Neutro Abs: 4.5 10*3/uL (ref 1.7–7.7)
Neutrophils Relative %: 60 %
Platelet Count: 359 10*3/uL (ref 150–400)
RBC: 3.71 MIL/uL — ABNORMAL LOW (ref 3.87–5.11)
RDW: 15.1 % (ref 11.5–15.5)
WBC Count: 7.4 10*3/uL (ref 4.0–10.5)
nRBC: 0 % (ref 0.0–0.2)

## 2017-11-25 MED ORDER — EPOETIN ALFA 40000 UNIT/ML IJ SOLN
40000.0000 [IU] | Freq: Once | INTRAMUSCULAR | Status: AC
  Administered 2017-11-25: 40000 [IU] via SUBCUTANEOUS

## 2017-11-25 MED ORDER — EPOETIN ALFA-EPBX 40000 UNIT/ML IJ SOLN
40000.0000 [IU] | Freq: Once | INTRAMUSCULAR | Status: DC
  Filled 2017-11-25: qty 1

## 2017-11-25 MED ORDER — EPOETIN ALFA 40000 UNIT/ML IJ SOLN
INTRAMUSCULAR | Status: AC
  Filled 2017-11-25: qty 1

## 2017-11-25 NOTE — Telephone Encounter (Signed)
Please advise 

## 2017-11-25 NOTE — Telephone Encounter (Signed)
Spoke to pt and she stated understanding of the dosage change, informed pt that I will call her back in a few days to check her progress

## 2017-11-25 NOTE — Telephone Encounter (Signed)
I did referral.   Please verify levemir is 160 units qam.  Then please reduce to 140 units qam.

## 2017-11-25 NOTE — Addendum Note (Signed)
Addended by: Tora Kindred on: 11/25/2017 01:04 PM   Modules accepted: Orders

## 2017-11-25 NOTE — Telephone Encounter (Signed)
Patient is concerned about her blood sugar level. Patient's blood sugar has been dropping to 37 (Sunday night-Ambulance came but she did not go to the hospital). Patient is experiencing the following symptoms: weakness, confusion, forgetful, dizziness. Patient requests to be called at ph# [phone]. Patient lives alone.  Patient's Kidney Doctor -Dr. Florene Glen is leaving his practice. Patient requests a referral to see a different Kidney Doctor.

## 2017-11-26 ENCOUNTER — Other Ambulatory Visit: Payer: Self-pay | Admitting: Family Medicine

## 2017-11-28 NOTE — Telephone Encounter (Signed)
Spoke to pt and she stated that her blood sugars have been doing a lot better

## 2017-12-05 ENCOUNTER — Other Ambulatory Visit: Payer: Self-pay | Admitting: *Deleted

## 2017-12-05 NOTE — Patient Outreach (Signed)
[place] [facility]) Care Management  12/05/2017  Courtney Goodman Aug 01, 1945 [id number]   RN Health Coach attempted follow up outreach call to patient.  Patient was unavailable. HIPPA compliance voicemail message left with return callback number.  Plan: RN will call patient again within 14 days.  [place] Care Management [phone]

## 2017-12-15 ENCOUNTER — Ambulatory Visit: Payer: Self-pay | Admitting: *Deleted

## 2017-12-16 ENCOUNTER — Telehealth: Payer: Self-pay

## 2017-12-16 ENCOUNTER — Inpatient Hospital Stay: Payer: Medicare HMO

## 2017-12-16 ENCOUNTER — Encounter: Payer: Self-pay | Admitting: Internal Medicine

## 2017-12-16 ENCOUNTER — Other Ambulatory Visit: Payer: Self-pay

## 2017-12-16 ENCOUNTER — Inpatient Hospital Stay: Payer: Medicare HMO | Attending: Oncology

## 2017-12-16 ENCOUNTER — Inpatient Hospital Stay (HOSPITAL_BASED_OUTPATIENT_CLINIC_OR_DEPARTMENT_OTHER): Payer: Medicare HMO | Admitting: Internal Medicine

## 2017-12-16 VITALS — BP 131/70 | HR 74 | Temp 98.3°F | Resp 18 | Ht 64.0 in | Wt 171.2 lb

## 2017-12-16 DIAGNOSIS — E119 Type 2 diabetes mellitus without complications: Secondary | ICD-10-CM

## 2017-12-16 DIAGNOSIS — I2510 Atherosclerotic heart disease of native coronary artery without angina pectoris: Secondary | ICD-10-CM | POA: Insufficient documentation

## 2017-12-16 DIAGNOSIS — E559 Vitamin D deficiency, unspecified: Secondary | ICD-10-CM | POA: Diagnosis not present

## 2017-12-16 DIAGNOSIS — N189 Chronic kidney disease, unspecified: Secondary | ICD-10-CM

## 2017-12-16 DIAGNOSIS — Z794 Long term (current) use of insulin: Secondary | ICD-10-CM | POA: Diagnosis not present

## 2017-12-16 DIAGNOSIS — I129 Hypertensive chronic kidney disease with stage 1 through stage 4 chronic kidney disease, or unspecified chronic kidney disease: Secondary | ICD-10-CM

## 2017-12-16 DIAGNOSIS — D638 Anemia in other chronic diseases classified elsewhere: Secondary | ICD-10-CM

## 2017-12-16 DIAGNOSIS — R7989 Other specified abnormal findings of blood chemistry: Secondary | ICD-10-CM | POA: Diagnosis not present

## 2017-12-16 DIAGNOSIS — D631 Anemia in chronic kidney disease: Secondary | ICD-10-CM | POA: Insufficient documentation

## 2017-12-16 DIAGNOSIS — J45909 Unspecified asthma, uncomplicated: Secondary | ICD-10-CM | POA: Diagnosis not present

## 2017-12-16 DIAGNOSIS — Z79899 Other long term (current) drug therapy: Secondary | ICD-10-CM | POA: Diagnosis not present

## 2017-12-16 DIAGNOSIS — E785 Hyperlipidemia, unspecified: Secondary | ICD-10-CM | POA: Insufficient documentation

## 2017-12-16 DIAGNOSIS — M8580 Other specified disorders of bone density and structure, unspecified site: Secondary | ICD-10-CM

## 2017-12-16 DIAGNOSIS — J449 Chronic obstructive pulmonary disease, unspecified: Secondary | ICD-10-CM | POA: Diagnosis not present

## 2017-12-16 LAB — CBC WITH DIFFERENTIAL (CANCER CENTER ONLY)
Abs Immature Granulocytes: 0.02 10*3/uL (ref 0.00–0.07)
Basophils Absolute: 0 10*3/uL (ref 0.0–0.1)
Basophils Relative: 1 %
Eosinophils Absolute: 0.1 10*3/uL (ref 0.0–0.5)
Eosinophils Relative: 2 %
HCT: 36.1 % (ref 36.0–46.0)
Hemoglobin: 11.4 g/dL — ABNORMAL LOW (ref 12.0–15.0)
Immature Granulocytes: 0 %
Lymphocytes Relative: 22 %
Lymphs Abs: 1.6 10*3/uL (ref 0.7–4.0)
MCH: 29.7 pg (ref 26.0–34.0)
MCHC: 31.6 g/dL (ref 30.0–36.0)
MCV: 94 fL (ref 80.0–100.0)
Monocytes Absolute: 0.6 10*3/uL (ref 0.1–1.0)
Monocytes Relative: 9 %
Neutro Abs: 4.7 10*3/uL (ref 1.7–7.7)
Neutrophils Relative %: 66 %
Platelet Count: 313 10*3/uL (ref 150–400)
RBC: 3.84 MIL/uL — ABNORMAL LOW (ref 3.87–5.11)
RDW: 15.3 % (ref 11.5–15.5)
WBC Count: 7.1 10*3/uL (ref 4.0–10.5)
nRBC: 0 % (ref 0.0–0.2)

## 2017-12-16 NOTE — Progress Notes (Signed)
[place] [phone]   [phone]  OFFICE PROGRESS NOTE  [name], NP-C [place] [id number]  DIAGNOSIS: Anemia of chronic disease secondary to chronic renal insufficiency  PRIOR THERAPY: None  CURRENT THERAPY: Aranesp 300 mcg subcutaneously every 3 weeks.  INTERVAL HISTORY: Courtney Goodman 72 y.o. female returns to the clinic today for follow-up visit.  The patient is feeling fine today with no concerning complaints.  She denied having any significant fatigue or weakness.  She denied having any dizzy spells.  She has no chest pain, shortness of breath, cough or hemoptysis.  She denied having any recent weight loss or night sweats.  She continues to tolerate her treatment with Aranesp every 3 weeks fairly well.  The patient is here today for evaluation and repeat blood work.  MEDICAL HISTORY: Past Medical History:  Diagnosis Date  . Anemia of chronic disease   . Asthma   . Atherosclerotic heart disease of native coronary artery without angina pectoris   . CKD (chronic kidney disease)   . COPD (chronic obstructive pulmonary disease) ([place])   . DM (diabetes mellitus), type 2 with renal complications ([place])   . Dysrhythmia   . Elevated ferritin 01/03/2017  . GAD (generalized anxiety disorder)   . Hypertension   . Mixed hyperlipidemia due to type 2 diabetes mellitus ([place])   . Mixed incontinence    per medical records from [place]  . Osteopenia   . Personal history of noncompliance with medical treatment, presenting hazards to health   . Shortness of breath dyspnea   . Suicidal ideation   . TB (tuberculosis), treated    age 48  . Vitamin D deficiency     ALLERGIES:  has No Known Allergies.  MEDICATIONS:  Current Outpatient Medications  Medication Sig Dispense Refill  . albuterol (PROVENTIL HFA;VENTOLIN HFA) 108 (90 Base) MCG/ACT inhaler Inhale 2 puffs into the lungs every 6 (six) hours as needed for wheezing  or shortness of breath. 1 Inhaler 0  . amLODipine-benazepril (LOTREL) 5-20 MG capsule Take 1 capsule by mouth daily. 90 capsule 0  . aspirin EC 81 MG tablet Take 81 mg by mouth daily.     . Biotin 10000 MCG TABS Take 1 tablet by mouth daily.     . Blood Glucose Monitoring Suppl (ACCU-CHEK AVIVA PLUS) w/Device KIT Test twice a day. Dx code E11.9 1 kit 0  . carvedilol (COREG) 6.25 MG tablet Take 1 tablet by mouth twice daily. 180 tablet 0  . cholecalciferol (VITAMIN D) 1000 units tablet Take 1 tablet by mouth daily. 90 tablet 1  . citalopram (CELEXA) 20 MG tablet Take 1 tablet (20 mg total) by mouth daily. 90 tablet 1  . fenofibrate (TRICOR) 145 MG tablet Take 1 tablet (145 mg total) by mouth daily. 90 tablet 1  . fluticasone (FLONASE) 50 MCG/ACT nasal spray Place 2 sprays into both nostrils daily. 48 g 0  . folic acid (FOLVITE) 1 MG tablet Take 1 tablet (1 mg total) by mouth daily. 90 tablet 1  . Insulin Pen Needle 31G X 5 MM MISC Use to inject insulin three times daily 100 each 1  . LEVEMIR FLEXTOUCH 100 UNIT/ML Pen Inject 160 Units into the skin every morning. (Patient taking differently: Inject 140 Units into the skin daily. ) 5 pen 2  . liraglutide (VICTOZA) 18 MG/3ML SOPN Inject 1.8 mg subcutaneously daily. 3 pen 11  . omeprazole (PRILOSEC) 40 MG capsule  Take 1 capsule (40 mg total) by mouth daily. 90 capsule 1  . simvastatin (ZOCOR) 20 MG tablet Take 1 tablet by mouth every night. 90 tablet 1  . traMADol (ULTRAM) 50 MG tablet Take 1 tablet by mouth daily as needed. (Patient taking differently: Take 1 tablet by mouth daily as needed. for pain) 90 tablet 0  . traZODone (DESYREL) 50 MG tablet Take 50 mg by mouth at bedtime. Patient gets this from her therapist, not PCP    . TRUE METRIX BLOOD GLUCOSE TEST test strip USE AS DIRECTED THREE TIMES A DAY 100 each 2   No current facility-administered medications for this visit.     SURGICAL HISTORY:  Past Surgical History:  Procedure Laterality  Date  . ANKLE RECONSTRUCTION     right  . CARPAL TUNNEL RELEASE    . CORONARY ANGIOPLASTY      REVIEW OF SYSTEMS:  A comprehensive review of systems was negative.   PHYSICAL EXAMINATION: General appearance: alert, cooperative and no distress Head: Normocephalic, without obvious abnormality, atraumatic Neck: no adenopathy, no JVD, supple, symmetrical, trachea midline and thyroid not enlarged, symmetric, no tenderness/mass/nodules Lymph nodes: Cervical, supraclavicular, and axillary nodes normal. Resp: clear to auscultation bilaterally Back: symmetric, no curvature. ROM normal. No CVA tenderness. Cardio: regular rate and rhythm, S1, S2 normal, no murmur, click, rub or gallop GI: soft, non-tender; bowel sounds normal; no masses,  no organomegaly Extremities: extremities normal, atraumatic, no cyanosis or edema  ECOG PERFORMANCE STATUS: 1 - Symptomatic but completely ambulatory  Blood pressure 131/70, pulse 74, temperature 98.3 F (36.8 C), temperature source Oral, resp. rate 18, height '5\' 4"'$  (1.626 m), weight 171 lb 3.2 oz (77.7 kg), SpO2 100 %.  LABORATORY DATA: Lab Results  Component Value Date   WBC 7.1 12/16/2017   HGB 11.4 (L) 12/16/2017   HCT 36.1 12/16/2017   MCV 94.0 12/16/2017   PLT 313 12/16/2017      Chemistry      Component Value Date/Time   NA 141 11/04/2017 0855   NA 146 (H) 05/07/2017 1147   NA 137 05/29/2011 1946   K 4.4 11/04/2017 0855   K 3.9 05/29/2011 1946   CL 106 11/04/2017 0855   CL 100 05/29/2011 1946   CO2 28 11/04/2017 0855   CO2 28 05/29/2011 1946   BUN 25 (H) 11/04/2017 0855   BUN 28 (H) 05/07/2017 1147   BUN 24 (H) 05/29/2011 1946   CREATININE 1.56 (H) 11/04/2017 0855   CREATININE 1.56 (H) 01/01/2017 0941      Component Value Date/Time   CALCIUM 10.0 11/04/2017 0855   CALCIUM 9.8 05/29/2011 1946   ALKPHOS 52 11/04/2017 0855   ALKPHOS 86 05/29/2011 1946   AST 15 11/04/2017 0855   ALT 9 11/04/2017 0855   ALT 15 05/29/2011 1946    BILITOT 0.4 11/04/2017 0855       RADIOGRAPHIC STUDIES: No results found.  ASSESSMENT AND PLAN: This is a very pleasant 72 years old African-American female with anemia of chronic disease secondary to chronic renal insufficiency.  The patient is currently undergoing treatment with Aranesp injections 300 mcg subcutaneously every 3 weeks. The patient continues to tolerate this treatment fairly well.  She is also on oral iron tablets. I recommended for her to continue her current treatment with Aranesp every 3 weeks as needed for low hematocrit. I will see her back for follow-up visit in 3 months for evaluation with repeat CBC, iron study and ferritin. The patient was advised  to call immediately if she has any concerning symptoms in the interval. The patient voices understanding of current disease status and treatment options and is in agreement with the current care plan. All questions were answered. The patient knows to call the clinic with any problems, questions or concerns. We can certainly see the patient much sooner if necessary.  I spent 10 minutes counseling the patient face to face. The total time spent in the appointment was 15 minutes.  Disclaimer: This note was dictated with voice recognition software. Similar sounding words can inadvertently be transcribed and may not be corrected upon review.

## 2017-12-16 NOTE — Telephone Encounter (Signed)
Printed avs and calender of upcoming appointment. Per 11/19 los 

## 2017-12-16 NOTE — Patient Outreach (Signed)
[place] [facility]) Care Management  12/16/2017  Courtney Goodman February 10, 1945 [id number]    2nd outreach attempt to the patient for initial assessment.  HIPAA verified by the patient.  Explained the health coach role to the patient.  She was very receptive.  She stated that she would not be able to complete the assessment today but asked if we could call back.   Plan: [name] will make outreach attempt the patient within in one month. [name] will return the call to the patient.  Lazaro Arms RN, BSN, [place] Direct Dial:  [phone]  Fax: [phone]

## 2017-12-22 ENCOUNTER — Telehealth: Payer: Self-pay | Admitting: Family Medicine

## 2017-12-22 ENCOUNTER — Ambulatory Visit: Payer: Medicare HMO | Admitting: Family Medicine

## 2017-12-22 NOTE — Telephone Encounter (Signed)
Called pt as she missed her appt.  She states she is in a bad way and she is talking with the police.  I advised her to let us know if we can help.

## 2017-12-22 NOTE — Telephone Encounter (Signed)
Thank you for checking on her.

## 2017-12-27 ENCOUNTER — Other Ambulatory Visit: Payer: Self-pay | Admitting: Family Medicine

## 2017-12-29 NOTE — Telephone Encounter (Signed)
Pt was advised to call back to schedule an appt as we are only giving her a 30 day supply..    ( pillpack requires 90 days

## 2018-01-02 ENCOUNTER — Other Ambulatory Visit: Payer: Self-pay

## 2018-01-02 ENCOUNTER — Ambulatory Visit: Payer: Self-pay | Admitting: *Deleted

## 2018-01-02 ENCOUNTER — Telehealth: Payer: Self-pay | Admitting: Internal Medicine

## 2018-01-02 NOTE — Patient Outreach (Signed)
[facility]) Care Management  01/02/2018  Quinlee Sciarra Crisostomo [birth year] [id number]    3rd outreach to the patient for initial assessment.  The patient stated that she was not at home.  She was at her brothers house in [place] and was unable to talk.  She asked if I could call her back.  Plan: [place] will make outreach attempt the patient within in one month. Will schedule follow up with [name] RN.  Lazaro Arms RN, BSN, [place] Direct Dial:  [phone]  Fax: [phone]

## 2018-01-02 NOTE — Telephone Encounter (Signed)
Scheduled appt per 12/6 sch message - pt is aware of lab appts added.

## 2018-01-06 ENCOUNTER — Inpatient Hospital Stay: Payer: Medicare HMO

## 2018-01-06 ENCOUNTER — Inpatient Hospital Stay: Payer: Medicare HMO | Attending: Oncology

## 2018-01-06 VITALS — BP 135/64 | HR 74 | Temp 98.1°F | Resp 18

## 2018-01-06 DIAGNOSIS — D638 Anemia in other chronic diseases classified elsewhere: Secondary | ICD-10-CM

## 2018-01-06 DIAGNOSIS — N189 Chronic kidney disease, unspecified: Secondary | ICD-10-CM | POA: Diagnosis not present

## 2018-01-06 DIAGNOSIS — N183 Chronic kidney disease, stage 3 unspecified: Secondary | ICD-10-CM

## 2018-01-06 DIAGNOSIS — D631 Anemia in chronic kidney disease: Secondary | ICD-10-CM | POA: Diagnosis not present

## 2018-01-06 LAB — CBC WITH DIFFERENTIAL (CANCER CENTER ONLY)
Abs Immature Granulocytes: 0.04 10*3/uL (ref 0.00–0.07)
Basophils Absolute: 0 10*3/uL (ref 0.0–0.1)
Basophils Relative: 1 %
Eosinophils Absolute: 0.2 10*3/uL (ref 0.0–0.5)
Eosinophils Relative: 2 %
HCT: 30.7 % — ABNORMAL LOW (ref 36.0–46.0)
Hemoglobin: 9.7 g/dL — ABNORMAL LOW (ref 12.0–15.0)
Immature Granulocytes: 1 %
Lymphocytes Relative: 21 %
Lymphs Abs: 1.8 10*3/uL (ref 0.7–4.0)
MCH: 29.1 pg (ref 26.0–34.0)
MCHC: 31.6 g/dL (ref 30.0–36.0)
MCV: 92.2 fL (ref 80.0–100.0)
Monocytes Absolute: 0.5 10*3/uL (ref 0.1–1.0)
Monocytes Relative: 6 %
Neutro Abs: 6.1 10*3/uL (ref 1.7–7.7)
Neutrophils Relative %: 69 %
Platelet Count: 297 10*3/uL (ref 150–400)
RBC: 3.33 MIL/uL — ABNORMAL LOW (ref 3.87–5.11)
RDW: 15.4 % (ref 11.5–15.5)
WBC Count: 8.7 10*3/uL (ref 4.0–10.5)
nRBC: 0 % (ref 0.0–0.2)

## 2018-01-06 MED ORDER — EPOETIN ALFA-EPBX 40000 UNIT/ML IJ SOLN
40000.0000 [IU] | Freq: Once | INTRAMUSCULAR | Status: AC
  Administered 2018-01-06: 40000 [IU] via SUBCUTANEOUS
  Filled 2018-01-06: qty 1

## 2018-01-16 ENCOUNTER — Ambulatory Visit: Payer: Medicare HMO | Admitting: Podiatry

## 2018-01-26 ENCOUNTER — Other Ambulatory Visit: Payer: Self-pay | Admitting: *Deleted

## 2018-01-26 NOTE — Patient Outreach (Signed)
[facility]) Care Management  01/26/2018  Courtney Goodman 1945/03/21 [id number]    4th attempt to outreach the patient for initial assessment.  No answer.  Unable to leave a message no voicemail pickup after five rings.   Plan: RN Health Coach will send letter. If no response to calls and letter in ten business days [place] will proceed with case closure.    Lazaro Arms RN, BSN, [place] Direct Dial:  [phone]  Fax: [phone]

## 2018-01-27 ENCOUNTER — Ambulatory Visit: Payer: Medicare HMO | Admitting: Podiatry

## 2018-01-27 ENCOUNTER — Inpatient Hospital Stay: Payer: Medicare HMO

## 2018-01-27 VITALS — BP 128/82 | HR 76 | Temp 98.0°F | Resp 18

## 2018-01-27 DIAGNOSIS — N183 Chronic kidney disease, stage 3 unspecified: Secondary | ICD-10-CM

## 2018-01-27 DIAGNOSIS — N189 Chronic kidney disease, unspecified: Secondary | ICD-10-CM | POA: Diagnosis not present

## 2018-01-27 DIAGNOSIS — D638 Anemia in other chronic diseases classified elsewhere: Secondary | ICD-10-CM

## 2018-01-27 DIAGNOSIS — D631 Anemia in chronic kidney disease: Secondary | ICD-10-CM | POA: Diagnosis not present

## 2018-01-27 LAB — CBC WITH DIFFERENTIAL (CANCER CENTER ONLY)
Abs Immature Granulocytes: 0.02 10*3/uL (ref 0.00–0.07)
Basophils Absolute: 0 10*3/uL (ref 0.0–0.1)
Basophils Relative: 1 %
Eosinophils Absolute: 0.1 10*3/uL (ref 0.0–0.5)
Eosinophils Relative: 2 %
HCT: 33.9 % — ABNORMAL LOW (ref 36.0–46.0)
Hemoglobin: 10.4 g/dL — ABNORMAL LOW (ref 12.0–15.0)
Immature Granulocytes: 0 %
Lymphocytes Relative: 21 %
Lymphs Abs: 1.7 10*3/uL (ref 0.7–4.0)
MCH: 29.1 pg (ref 26.0–34.0)
MCHC: 30.7 g/dL (ref 30.0–36.0)
MCV: 95 fL (ref 80.0–100.0)
Monocytes Absolute: 0.5 10*3/uL (ref 0.1–1.0)
Monocytes Relative: 6 %
Neutro Abs: 5.6 10*3/uL (ref 1.7–7.7)
Neutrophils Relative %: 70 %
Platelet Count: 321 10*3/uL (ref 150–400)
RBC: 3.57 MIL/uL — ABNORMAL LOW (ref 3.87–5.11)
RDW: 16.6 % — ABNORMAL HIGH (ref 11.5–15.5)
WBC Count: 8 10*3/uL (ref 4.0–10.5)
nRBC: 0 % (ref 0.0–0.2)

## 2018-01-27 MED ORDER — EPOETIN ALFA-EPBX 40000 UNIT/ML IJ SOLN
40000.0000 [IU] | Freq: Once | INTRAMUSCULAR | Status: AC
  Administered 2018-01-27: 40000 [IU] via SUBCUTANEOUS
  Filled 2018-01-27: qty 1

## 2018-01-27 NOTE — Patient Instructions (Signed)
Epoetin Alfa injection °What is this medicine? °EPOETIN ALFA (e POE e tin AL fa) helps your body make more red blood cells. This medicine is used to treat anemia caused by chronic kidney disease, cancer chemotherapy, or HIV-therapy. It may also be used before surgery if you have anemia. °This medicine may be used for other purposes; ask your health care provider or pharmacist if you have questions. °COMMON BRAND NAME(S): Epogen, Procrit, Retacrit °What should I tell my health care provider before I take this medicine? °They need to know if you have any of these conditions: °-cancer °-heart disease °-high blood pressure °-history of blood clots °-history of stroke °-low levels of folate, iron, or vitamin B12 in the blood °-seizures °-an unusual or allergic reaction to erythropoietin, albumin, benzyl alcohol, hamster proteins, other medicines, foods, dyes, or preservatives °-pregnant or trying to get pregnant °-breast-feeding °How should I use this medicine? °This medicine is for injection into a vein or under the skin. It is usually given by a health care professional in a hospital or clinic setting. °If you get this medicine at home, you will be taught how to prepare and give this medicine. Use exactly as directed. Take your medicine at regular intervals. Do not take your medicine more often than directed. °It is important that you put your used needles and syringes in a special sharps container. Do not put them in a trash can. If you do not have a sharps container, call your pharmacist or healthcare provider to get one. °A special MedGuide will be given to you by the pharmacist with each prescription and refill. Be sure to read this information carefully each time. °Talk to your pediatrician regarding the use of this medicine in children. While this drug may be prescribed for selected conditions, precautions do apply. °Overdosage: If you think you have taken too much of this medicine contact a poison control center  or emergency room at once. °NOTE: This medicine is only for you. Do not share this medicine with others. °What if I miss a dose? °If you miss a dose, take it as soon as you can. If it is almost time for your next dose, take only that dose. Do not take double or extra doses. °What may interact with this medicine? °Interactions have not been studied. °This list may not describe all possible interactions. Give your health care provider a list of all the medicines, herbs, non-prescription drugs, or dietary supplements you use. Also tell them if you smoke, drink alcohol, or use illegal drugs. Some items may interact with your medicine. °What should I watch for while using this medicine? °Your condition will be monitored carefully while you are receiving this medicine. °You may need blood work done while you are taking this medicine. °This medicine may cause a decrease in vitamin B6. You should make sure that you get enough vitamin B6 while you are taking this medicine. Discuss the foods you eat and the vitamins you take with your health care professional. °What side effects may I notice from receiving this medicine? °Side effects that you should report to your doctor or health care professional as soon as possible: °-allergic reactions like skin rash, itching or hives, swelling of the face, lips, or tongue °-seizures °-signs and symptoms of a blood clot such as breathing problems; changes in vision; chest pain; severe, sudden headache; pain, swelling, warmth in the leg; trouble speaking; sudden numbness or weakness of the face, arm or leg °-signs and symptoms of a stroke   like changes in vision; confusion; trouble speaking or understanding; severe headaches; sudden numbness or weakness of the face, arm or leg; trouble walking; dizziness; loss of balance or coordination °Side effects that usually do not require medical attention (report to your doctor or health care professional if they continue or are  bothersome): °-chills °-cough °-dizziness °-fever °-headaches °-joint pain °-muscle cramps °-muscle pain °-nausea, vomiting °-pain, redness, or irritation at site where injected °This list may not describe all possible side effects. Call your doctor for medical advice about side effects. You may report side effects to FDA at 1-800-FDA-1088. °Where should I keep my medicine? °Keep out of the reach of children. °Store in a refrigerator between 2 and 8 degrees C (36 and 46 degrees F). Do not freeze or shake. Throw away any unused portion if using a single-dose vial. Multi-dose vials can be kept in the refrigerator for up to 21 days after the initial dose. Throw away unused medicine. °NOTE: This sheet is a summary. It may not cover all possible information. If you have questions about this medicine, talk to your doctor, pharmacist, or health care provider. °© 2019 Elsevier/Gold Standard (2016-08-23 08:35:19) ° °

## 2018-01-30 ENCOUNTER — Ambulatory Visit: Payer: Medicare HMO | Admitting: Podiatry

## 2018-02-10 ENCOUNTER — Telehealth: Payer: Self-pay | Admitting: Endocrinology

## 2018-02-10 ENCOUNTER — Encounter: Payer: Self-pay | Admitting: *Deleted

## 2018-02-10 ENCOUNTER — Other Ambulatory Visit: Payer: Self-pay | Admitting: *Deleted

## 2018-02-10 NOTE — Telephone Encounter (Signed)
Called [facility] [phone] - [place], [place] AT [address] and spoke with pharmacist. States pt has not been in their facility for Rx refills. Advised they do see that [facility] is processing a refill request for pen needles. Provided me with contact # [phone]. Called and spoke with pharmacist. States they have not seen pt nor are trying to fill Rx for Levemir. Advised I would call pt for further clarification re: which [facility] pt is currently at.   Called pt and LVM requesting returned call. Previous Rx sent to [place], [place] [facility] [place] 100 UNIT/ML Pen 5 pen 2 11/20/2017    Sig: Inject 160 Units into the skin every morning.   Patient taking differently: Inject 140 Units into the skin daily.        Sent to pharmacy as: LEVEMIR FLEXTOUCH 100 UNIT/ML Pen   E-Prescribing Status: Receipt confirmed by pharmacy (11/20/2017  8:02 AM EDT)    If trying to fill at a [facility] location, a new Rx will need to be sent to that specific pharmacy.

## 2018-02-10 NOTE — Telephone Encounter (Signed)
Patient has called our office several times with a conflict with [facility]. Patient is stating that the pharamist is refusing to refill her RX without the doctor calling first. I informed the patient of our process with the pharmacy with refilling Rx's. Patient is really confused and concerned. Patient stated she needs her LEVEMIR FLEXTOUCH 100 UNIT/ML Pen filled.  FYI

## 2018-02-11 ENCOUNTER — Ambulatory Visit (INDEPENDENT_AMBULATORY_CARE_PROVIDER_SITE_OTHER): Payer: Medicare HMO | Admitting: Family Medicine

## 2018-02-11 ENCOUNTER — Encounter: Payer: Self-pay | Admitting: Family Medicine

## 2018-02-11 VITALS — BP 130/70 | HR 68 | Wt 171.0 lb

## 2018-02-11 DIAGNOSIS — I10 Essential (primary) hypertension: Secondary | ICD-10-CM

## 2018-02-11 DIAGNOSIS — F332 Major depressive disorder, recurrent severe without psychotic features: Secondary | ICD-10-CM | POA: Diagnosis not present

## 2018-02-11 NOTE — Patient Instructions (Addendum)
See your psychiatrist, Lattie Haw. Call her today as we discussed.   Your BP is fine at 130/70. Continue your current medications.

## 2018-02-11 NOTE — Progress Notes (Addendum)
   Subjective:    Patient ID: Courtney Goodman, female    DOB: Aug 04, 1945, 74 y.o.   MRN: [id number]  HPI Chief Complaint  Patient presents with  . follow-up    follow-up. has new kidney doctor Courtney Goodman on january 27th.   She is here to follow up on depression.  At her previous visit in October she commented that she would like to move into an assisted living facility because at that time she felt like she did not have any help at home and she was by herself mostly.  She then reports that her grandson was coming and going and she did not trust him.  States he was making her life miserable. Since then she reports taking out a restraining order on him and apparently he is now out of the picture which has decreased her stress level.  States her mood has improved significantly since that time. States she is doing well.   She is planning to see her psychiatrist/ therapist on [facility].   States she has a new kidney doctor- Courtney Goodman at [facility] and has an appointment next week.   She is also scheduled to see Courtney Goodman next week.   Courtney Goodman is managing her diabetes and she has an appointment on 03/03/18 with him.   She has no other concerns today.   Denies fever, chills, dizziness, chest pain, palpitations, shortness of breath, abdominal pain, N/V/D, urinary symptoms, LE edema.   Reviewed allergies, medications, past medical, surgical, family, and social history.    Review of Systems Pertinent positives and negatives in the history of present illness.     Objective:   Physical Exam BP 130/70   Pulse 68   Wt 171 lb (77.6 kg)   BMI 29.35 kg/m   Alert and oriented and in no acute distress.  Normal mood, speech and thought process today.  Not otherwise examined.      Assessment & Plan:  Severe episode of recurrent major depressive disorder, without psychotic features ([place])  Essential hypertension  I thanked her for coming in today to give me the update.   She appears to be doing much better and her mood is stable.  She will continue seeing her psychiatrist and going for counseling sessions.  She will also continue seeing her multiple specialists including endocrinology, nephrology and hematology. Her blood pressure is in goal range today. She will follow-up with me in 3 months or sooner if needed.

## 2018-02-16 ENCOUNTER — Other Ambulatory Visit: Payer: Self-pay | Admitting: *Deleted

## 2018-02-16 NOTE — Patient Outreach (Signed)
[facility]) Care Management  02/16/2018  Courtney Goodman [place] 1945-02-08 [id number]   Referral received from care management assistant to help member schedule diabetic eye exam.  She also has history of hypertension, CAD, COPD, GERD, CKD, and hyperlipidemia.  Call placed to member, identity verified.  She state last eye exam was greater than 2 years ago, agrees to have this care manager call to schedule visit.  In-network providers reviewed, one selected based on member's choice for location.  Call placed to [facility] on [address], however office closed today.  Member provided with number and address, advised to call tomorrow to schedule appointment.  Of note, member active with [facility] health coach.  This care manager will follow up within the next week to confirm appointment has been made.  [name], [place], MSN [place] [phone]

## 2018-02-17 ENCOUNTER — Inpatient Hospital Stay: Payer: Medicare HMO

## 2018-02-17 ENCOUNTER — Inpatient Hospital Stay: Payer: Medicare HMO | Attending: Oncology

## 2018-02-17 DIAGNOSIS — N189 Chronic kidney disease, unspecified: Secondary | ICD-10-CM | POA: Diagnosis not present

## 2018-02-17 DIAGNOSIS — D638 Anemia in other chronic diseases classified elsewhere: Secondary | ICD-10-CM

## 2018-02-17 LAB — CBC WITH DIFFERENTIAL (CANCER CENTER ONLY)
Abs Immature Granulocytes: 0.02 10*3/uL (ref 0.00–0.07)
Basophils Absolute: 0 10*3/uL (ref 0.0–0.1)
Basophils Relative: 0 %
Eosinophils Absolute: 0.1 10*3/uL (ref 0.0–0.5)
Eosinophils Relative: 2 %
HCT: 35.7 % — ABNORMAL LOW (ref 36.0–46.0)
Hemoglobin: 11.2 g/dL — ABNORMAL LOW (ref 12.0–15.0)
Immature Granulocytes: 0 %
Lymphocytes Relative: 22 %
Lymphs Abs: 1.7 10*3/uL (ref 0.7–4.0)
MCH: 28.8 pg (ref 26.0–34.0)
MCHC: 31.4 g/dL (ref 30.0–36.0)
MCV: 91.8 fL (ref 80.0–100.0)
Monocytes Absolute: 0.5 10*3/uL (ref 0.1–1.0)
Monocytes Relative: 6 %
Neutro Abs: 5.2 10*3/uL (ref 1.7–7.7)
Neutrophils Relative %: 70 %
Platelet Count: 325 10*3/uL (ref 150–400)
RBC: 3.89 MIL/uL (ref 3.87–5.11)
RDW: 17.1 % — ABNORMAL HIGH (ref 11.5–15.5)
WBC Count: 7.6 10*3/uL (ref 4.0–10.5)
nRBC: 0 % (ref 0.0–0.2)

## 2018-02-17 NOTE — Progress Notes (Signed)
No injection needed today. Hgb 11.2. Lab results given to pt and transportation called for pt. Advised pt of any concerns to call [place]. Christin Moline LPN

## 2018-02-23 ENCOUNTER — Telehealth: Payer: Self-pay | Admitting: Internal Medicine

## 2018-02-23 DIAGNOSIS — Z6830 Body mass index (BMI) 30.0-30.9, adult: Secondary | ICD-10-CM | POA: Diagnosis not present

## 2018-02-23 DIAGNOSIS — N183 Chronic kidney disease, stage 3 (moderate): Secondary | ICD-10-CM | POA: Diagnosis not present

## 2018-02-23 DIAGNOSIS — F329 Major depressive disorder, single episode, unspecified: Secondary | ICD-10-CM | POA: Diagnosis not present

## 2018-02-23 DIAGNOSIS — E1122 Type 2 diabetes mellitus with diabetic chronic kidney disease: Secondary | ICD-10-CM | POA: Diagnosis not present

## 2018-02-23 DIAGNOSIS — D649 Anemia, unspecified: Secondary | ICD-10-CM | POA: Diagnosis not present

## 2018-02-23 MED ORDER — FENOFIBRATE 145 MG PO TABS: 145.0000 mg | ORAL_TABLET | Freq: Every day | ORAL | 1 refills | Status: DC

## 2018-02-23 NOTE — Telephone Encounter (Signed)
Pt called and needed a refill on her tricor to pillpack

## 2018-02-26 ENCOUNTER — Other Ambulatory Visit: Payer: Self-pay | Admitting: Family Medicine

## 2018-03-03 ENCOUNTER — Ambulatory Visit (INDEPENDENT_AMBULATORY_CARE_PROVIDER_SITE_OTHER): Payer: Medicare HMO | Admitting: Endocrinology

## 2018-03-03 ENCOUNTER — Encounter: Payer: Self-pay | Admitting: Endocrinology

## 2018-03-03 VITALS — BP 120/68 | HR 73 | Ht 64.0 in | Wt 170.6 lb

## 2018-03-03 DIAGNOSIS — Z794 Long term (current) use of insulin: Secondary | ICD-10-CM | POA: Diagnosis not present

## 2018-03-03 DIAGNOSIS — N183 Chronic kidney disease, stage 3 unspecified: Secondary | ICD-10-CM

## 2018-03-03 DIAGNOSIS — E1122 Type 2 diabetes mellitus with diabetic chronic kidney disease: Secondary | ICD-10-CM

## 2018-03-03 LAB — POCT GLYCOSYLATED HEMOGLOBIN (HGB A1C): Hemoglobin A1C: 6.7 % — AB (ref 4.0–5.6)

## 2018-03-03 MED ORDER — INSULIN DETEMIR 100 UNIT/ML FLEXPEN: 130.0000 [IU] | PEN_INJECTOR | SUBCUTANEOUS | 2 refills | Status: DC

## 2018-03-03 NOTE — Progress Notes (Signed)
Subjective:    Patient ID: Courtney Goodman, female    DOB: Dec 30, 1945, 73 y.o.   MRN: [id number]  HPI Pt returns for f/u of diabetes mellitus: DM type: Insulin-requiring type 2 Dx'ed: 1884 Complications: renal insufficiency and CAD Therapy: insulin since 2009, and victoza. GDM: never DKA: never Severe hypoglycemia: 2012 and 2019. Pancreatitis: never.  Other: she is on qd insulin, due to h/o noncompliance.   Interval history:  She says she takes insulin as rx'ed.  She says cbg varies from 55-200.  pt states she feels well in general.  There is no trend throughout the day. Past Medical History:  Diagnosis Date  . Anemia of chronic disease   . Asthma   . Atherosclerotic heart disease of native coronary artery without angina pectoris   . CKD (chronic kidney disease)   . COPD (chronic obstructive pulmonary disease) ([place])   . DM (diabetes mellitus), type 2 with renal complications ([place])   . Dysrhythmia   . Elevated ferritin 01/03/2017  . GAD (generalized anxiety disorder)   . Hypertension   . Mixed hyperlipidemia due to type 2 diabetes mellitus ([place])   . Mixed incontinence    per medical records from [place]  . Osteopenia   . Personal history of noncompliance with medical treatment, presenting hazards to health   . Shortness of breath dyspnea   . Suicidal ideation   . TB (tuberculosis), treated    age 23  . Vitamin D deficiency     Past Surgical History:  Procedure Laterality Date  . ANKLE RECONSTRUCTION     right  . CARPAL TUNNEL RELEASE    . CORONARY ANGIOPLASTY      Social History   Socioeconomic History  . Marital status: Divorced    Spouse name: Not on file  . Number of children: Not on file  . Years of education: Not on file  . Highest education level: Not on file  Occupational History  . Not on file  Social Needs  . Financial resource strain: Not on file  . Food insecurity:    Worry: Not on file    Inability: Not on file  . Transportation needs:   Medical: Not on file    Non-medical: Not on file  Tobacco Use  . Smoking status: Former Research scientist (life sciences)  . Smokeless tobacco: Never Used  Substance and Sexual Activity  . Alcohol use: No  . Drug use: No  . Sexual activity: Never  Lifestyle  . Physical activity:    Days per week: Not on file    Minutes per session: Not on file  . Stress: Not on file  Relationships  . Social connections:    Talks on phone: Not on file    Gets together: Not on file    Attends religious service: Not on file    Active member of club or organization: Not on file    Attends meetings of clubs or organizations: Not on file    Relationship status: Not on file  . Intimate partner violence:    Fear of current or ex partner: Not on file    Emotionally abused: Not on file    Physically abused: Not on file    Forced sexual activity: Not on file  Other Topics Concern  . Not on file  Social History Narrative  . Not on file    Current Outpatient Medications on File Prior to Visit  Medication Sig Dispense Refill  . albuterol (PROVENTIL HFA;VENTOLIN HFA) 108 (90 Base)  MCG/ACT inhaler Inhale 2 puffs into the lungs every 6 (six) hours as needed for wheezing or shortness of breath. 1 Inhaler 0  . amLODipine-benazepril (LOTREL) 5-20 MG capsule Take 1 capsule by mouth daily. 90 capsule 0  . aspirin EC 81 MG tablet Take 81 mg by mouth daily.     . Biotin 10000 MCG TABS Take 1 tablet by mouth daily.     . Blood Glucose Monitoring Suppl (ACCU-CHEK AVIVA PLUS) w/Device KIT Test twice a day. Dx code E11.9 1 kit 0  . carvedilol (COREG) 6.25 MG tablet Take 1 tablet by mouth twice daily. 90 tablet 0  . cholecalciferol (VITAMIN D) 1000 units tablet Take 1 tablet by mouth daily. 90 tablet 1  . citalopram (CELEXA) 20 MG tablet Take 1 tablet (20 mg total) by mouth daily. 90 tablet 1  . fenofibrate (TRICOR) 145 MG tablet Take 1 tablet (145 mg total) by mouth daily. 90 tablet 1  . fluticasone (FLONASE) 50 MCG/ACT nasal spray Place 2  sprays into both nostrils daily. 48 g 0  . folic acid (FOLVITE) 1 MG tablet Take 1 tablet (1 mg total) by mouth daily. 90 tablet 0  . Insulin Pen Needle 31G X 5 MM MISC Use to inject insulin three times daily 100 each 1  . liraglutide (VICTOZA) 18 MG/3ML SOPN Inject 1.8 mg subcutaneously daily. 3 pen 11  . omeprazole (PRILOSEC) 40 MG capsule Take 1 capsule (40 mg total) by mouth daily. 90 capsule 0  . simvastatin (ZOCOR) 20 MG tablet Take 1 tablet by mouth every night. 90 tablet 1  . traMADol (ULTRAM) 50 MG tablet Take 1 tablet by mouth daily as needed. (Patient taking differently: Take 1 tablet by mouth daily as needed. for pain) 90 tablet 0  . traZODone (DESYREL) 50 MG tablet Take 50 mg by mouth at bedtime. Patient gets this from her therapist, not PCP     No current facility-administered medications on file prior to visit.     No Known Allergies  Family History  Problem Relation Age of Onset  . Diabetes Mother     BP 120/68 (BP Location: Right Arm, Patient Position: Sitting, Cuff Size: Normal)   Pulse 73   Ht _0  (1.626 m)   Wt 170 lb 9.6 oz (77.4 kg)   SpO2 96%   BMI 29.28 kg/m    Review of Systems Denies LOC    Objective:   Physical Exam VITAL SIGNS:  See vs page GENERAL: no distress Pulses: dorsalis pedis intact bilat.   MSK: no deformity of the feet CV: no leg edema Skin:  no ulcer on the feet.  normal color and temp on the feet. Neuro: sensation is intact to touch on the feet Ext: There is bilateral onychomycosis of the toenails.    Lab Results  Component Value Date   HGBA1C 6.7 (A) 03/03/2018   Lab Results  Component Value Date   CREATININE 1.56 (H) 11/04/2017   BUN 25 (H) 11/04/2017   NA 141 11/04/2017   K 4.4 11/04/2017   CL 106 11/04/2017   CO2 28 11/04/2017       Assessment & Plan:  Insulin-requiring type 2 DM, with renal insuff: overcontrolled, given this regimen, which does match insulin to her changing needs throughout the day.    Patient  Instructions  check your blood sugar twice a day.  vary the time of day when you check, between before the 3 meals, and at bedtime.  also check if  you have symptoms of your blood sugar being too high or too low.  please keep a record of the readings and bring it to your next appointment here (or you can bring the meter itself).  You can write it on any piece of paper.  please call us sooner if your blood sugar goes below 70, or if you have a lot of readings over 200.   Please reduce the insulin to 130 units each morning.  On this type of insulin schedule, you should eat meals on a regular schedule.  If a meal is missed or significantly delayed, your blood sugar could go low.   Please come back for a follow-up appointment in 4 months.

## 2018-03-03 NOTE — Patient Instructions (Signed)
check your blood sugar twice a day.  vary the time of day when you check, between before the 3 meals, and at bedtime.  also check if you have symptoms of your blood sugar being too high or too low.  please keep a record of the readings and bring it to your next appointment here (or you can bring the meter itself).  You can write it on any piece of paper.  please call us sooner if your blood sugar goes below 70, or if you have a lot of readings over 200.   Please reduce the insulin to 130 units each morning.  On this type of insulin schedule, you should eat meals on a regular schedule.  If a meal is missed or significantly delayed, your blood sugar could go low.   Please come back for a follow-up appointment in 4 months.

## 2018-03-06 ENCOUNTER — Telehealth: Payer: Self-pay | Admitting: Internal Medicine

## 2018-03-06 NOTE — Telephone Encounter (Signed)
MM PAL 2/11 - moved appointments to 2/12 with VT. Left message for patient. Also called 2nd number and spoke with patient dtr re change and new date/time. Per dtr she will try to get hold of her with the message.

## 2018-03-10 ENCOUNTER — Other Ambulatory Visit: Payer: Medicare HMO

## 2018-03-10 ENCOUNTER — Ambulatory Visit: Payer: Medicare HMO

## 2018-03-10 ENCOUNTER — Ambulatory Visit: Payer: Medicare HMO | Admitting: Internal Medicine

## 2018-03-11 ENCOUNTER — Inpatient Hospital Stay: Payer: Medicare HMO | Attending: Oncology

## 2018-03-11 ENCOUNTER — Inpatient Hospital Stay: Payer: Medicare HMO | Admitting: Physician Assistant

## 2018-03-11 ENCOUNTER — Inpatient Hospital Stay (HOSPITAL_BASED_OUTPATIENT_CLINIC_OR_DEPARTMENT_OTHER): Payer: Medicare HMO | Admitting: Medical

## 2018-03-11 ENCOUNTER — Inpatient Hospital Stay: Payer: Medicare HMO

## 2018-03-11 VITALS — BP 148/76 | HR 69 | Temp 97.7°F | Resp 18 | Ht 64.0 in | Wt 171.5 lb

## 2018-03-11 DIAGNOSIS — D631 Anemia in chronic kidney disease: Secondary | ICD-10-CM

## 2018-03-11 DIAGNOSIS — D638 Anemia in other chronic diseases classified elsewhere: Secondary | ICD-10-CM

## 2018-03-11 DIAGNOSIS — N183 Chronic kidney disease, stage 3 unspecified: Secondary | ICD-10-CM

## 2018-03-11 DIAGNOSIS — N189 Chronic kidney disease, unspecified: Secondary | ICD-10-CM

## 2018-03-11 DIAGNOSIS — R197 Diarrhea, unspecified: Secondary | ICD-10-CM

## 2018-03-11 LAB — CBC WITH DIFFERENTIAL (CANCER CENTER ONLY)
Abs Immature Granulocytes: 0.06 10*3/uL (ref 0.00–0.07)
Basophils Absolute: 0 10*3/uL (ref 0.0–0.1)
Basophils Relative: 0 %
Eosinophils Absolute: 0.2 10*3/uL (ref 0.0–0.5)
Eosinophils Relative: 2 %
HCT: 32.8 % — ABNORMAL LOW (ref 36.0–46.0)
Hemoglobin: 10.2 g/dL — ABNORMAL LOW (ref 12.0–15.0)
Immature Granulocytes: 1 %
Lymphocytes Relative: 17 %
Lymphs Abs: 1.8 10*3/uL (ref 0.7–4.0)
MCH: 28.7 pg (ref 26.0–34.0)
MCHC: 31.1 g/dL (ref 30.0–36.0)
MCV: 92.1 fL (ref 80.0–100.0)
Monocytes Absolute: 0.7 10*3/uL (ref 0.1–1.0)
Monocytes Relative: 7 %
Neutro Abs: 7.7 10*3/uL (ref 1.7–7.7)
Neutrophils Relative %: 73 %
Platelet Count: 332 10*3/uL (ref 150–400)
RBC: 3.56 MIL/uL — ABNORMAL LOW (ref 3.87–5.11)
RDW: 16.9 % — ABNORMAL HIGH (ref 11.5–15.5)
WBC Count: 10.6 10*3/uL — ABNORMAL HIGH (ref 4.0–10.5)
nRBC: 0 % (ref 0.0–0.2)

## 2018-03-11 LAB — IRON AND TIBC
Iron: 61 ug/dL (ref 41–142)
Saturation Ratios: 19 % — ABNORMAL LOW (ref 21–57)
TIBC: 315 ug/dL (ref 236–444)
UIBC: 254 ug/dL (ref 120–384)

## 2018-03-11 LAB — FERRITIN: Ferritin: 275 ng/mL (ref 11–307)

## 2018-03-11 MED ORDER — EPOETIN ALFA-EPBX 40000 UNIT/ML IJ SOLN
40000.0000 [IU] | Freq: Once | INTRAMUSCULAR | Status: AC
  Administered 2018-03-11: 40000 [IU] via SUBCUTANEOUS
  Filled 2018-03-11: qty 1

## 2018-03-11 NOTE — Progress Notes (Signed)
Pt seen by PA Lucianne Lei only, no RN assessment at this time.  PA aware.  Pt ambulatory to lobby for injection appt.  LPN aware.

## 2018-03-12 NOTE — Progress Notes (Signed)
Sympt[facility] Management Clinic Progress Note   [name] [id number] 1945-08-14 73 y.o.  [name] is managed by Dr. Fanny Bien. Mohamed  Actively treated with chemotherapy/immunotherapy/hormonal therapy: Patient was treated with erythropoietin alpha as needed for her anemia of chronic disease.    Next Scheduled Appointment: 06/17/2018  Assessment: Plan:    Anemia of chronic disease  Diarrhea, unspecified type   Anemia of chronic disease: The patient presents to the clinic for ongoing dosing with erythropoietin alpha.  A CBC returned today with a hemoglobin of 10.2 and a hematocrit of 32.9.  She was given erythropoietin alpha 40,000 units [place] today.  She will return in 2 weeks for labs and for consideration of repeat dosing.  She is scheduled to see Dr. Julien Nordmann in follow-up on 06/17/2018.  Diarrhea: I have recommended over-the-counter Imodium to the patient.  Please see After Visit Summary for patient specific instructions.  Future Appointments  Date Time Provider [place]  03/25/2018 12:00 PM [facility] LAB ONLY [facility] None  03/25/2018 12:30 PM [facility] [place] FLUSH [facility] None  04/08/2018 12:15 PM [facility] LAB 3 [facility] None  04/08/2018 12:45 PM [facility] [place] FLUSH [facility] None  04/13/2018 10:30 AM Henson, Vickie L, NP-C [facility]  04/22/2018 12:00 PM [facility] LAB 3 [facility] None  04/22/2018 12:30 PM [facility] [place] FLUSH [facility] None  05/06/2018 12:00 PM [facility] LAB 3 [facility] None  05/06/2018 12:30 PM [facility] [place] FLUSH [facility] None  05/20/2018 12:00 PM [facility] LAB 3 [facility] None  05/20/2018 12:30 PM [facility] [place] FLUSH [facility] None  06/02/2018  9:15 AM Renato Shin, MD [facility] None  06/03/2018 12:00 PM [facility] LAB 3 [facility] None  06/03/2018 12:30 PM [facility] [place] FLUSH [facility] None  06/17/2018 11:30 AM [facility] LAB 4 [facility] None  06/17/2018 12:00 PM Curt Bears, MD [facility] None  06/17/2018 12:30 PM  [facility] [place] FLUSH [facility] None    No orders of the defined types were placed in this encounter.      Subjective:   Patient ID:  Courtney Goodman is a 73 y.o. (DOB 1945/05/09) female.  Chief Complaint: No chief complaint on file.   HPI Courtney Goodman   is a 73 year old female with a history of anemia of chronic disease secondary to chronic kidney disease who is followed by Dr. Julien Nordmann and is dosed with erythropoietin alpha as needed.  Her labs today returned with a hemoglobin of 10.2 and hematocrit of 32.8.  The patient reports that she has had some episodic diarrhea recently.  She has had no changes in medications and has had no antibiotics.  She has not been taking any antidiarrheal medications.  Medications: I have reviewed the patient's current medications.  Allergies: No Known Allergies  Past Medical History:  Diagnosis Date  . Anemia of chronic disease   . Asthma   . Atherosclerotic heart disease of native coronary artery without angina pectoris   . CKD (chronic kidney disease)   . COPD (chronic obstructive pulmonary disease) ([place])   . DM (diabetes mellitus), type 2 with renal complications ([place])   . Dysrhythmia   . Elevated ferritin 01/03/2017  . GAD (generalized anxiety disorder)   . Hypertension   . Mixed hyperlipidemia due to type 2 diabetes mellitus ([place])   . Mixed incontinence    per medical records from [place]  . Osteopenia   . Personal history of noncompliance with medical treatment, presenting hazards to health   . Shortness of breath dyspnea   . Suicidal ideation   .  TB (tuberculosis), treated    age 54  . Vitamin D deficiency     Past Surgical History:  Procedure Laterality Date  . ANKLE RECONSTRUCTION     right  . CARPAL TUNNEL RELEASE    . CORONARY ANGIOPLASTY      Family History  Problem Relation Age of Onset  . Diabetes Mother     Social History   Socioeconomic History  . Marital status: Divorced    Spouse name: Not on file  .  Number of children: Not on file  . Years of education: Not on file  . Highest education level: Not on file  Occupational History  . Not on file  Social Needs  . Financial resource strain: Not on file  . Food insecurity:    Worry: Not on file    Inability: Not on file  . Transportation needs:    Medical: Not on file    Non-medical: Not on file  Tobacco Use  . Smoking status: Former Research scientist (life sciences)  . Smokeless tobacco: Never Used  Substance and Sexual Activity  . Alcohol use: No  . Drug use: No  . Sexual activity: Never  Lifestyle  . Physical activity:    Days per week: Not on file    Minutes per session: Not on file  . Stress: Not on file  Relationships  . Social connections:    Talks on phone: Not on file    Gets together: Not on file    Attends religious service: Not on file    Active member of club or organization: Not on file    Attends meetings of clubs or organizations: Not on file    Relationship status: Not on file  . Intimate partner violence:    Fear of current or ex partner: Not on file    Emotionally abused: Not on file    Physically abused: Not on file    Forced sexual activity: Not on file  Other Topics Concern  . Not on file  Social History Narrative  . Not on file    Past Medical History, Surgical history, Social history, and Family history were reviewed and updated as appropriate.   Please see review of systems for further details on the patient's review from today.   Review of Systems:  Review of Systems  Constitutional: Negative for appetite change, chills, diaphoresis and fever.  HENT: Negative for trouble swallowing.   Respiratory: Negative for cough, chest tightness and shortness of breath.   Cardiovascular: Negative for chest pain, palpitations and leg swelling.  Gastrointestinal: Positive for diarrhea. Negative for abdominal distention, abdominal pain, blood in stool, constipation, nausea and vomiting.  Genitourinary: Negative for decreased urine  volume and difficulty urinating.  Neurological: Negative for weakness.    Objective:   Physical Exam:  BP (!) 148/76 (BP Location: Left Arm, Patient Position: Sitting) Comment: Liza RN is aware  Pulse 69   Temp 97.7 F (36.5 C) (Oral)   Resp 18   Ht 5\' 4"  (1.626 m)   Wt 171 lb 8 oz (77.8 kg)   SpO2 100%   BMI 29.44 kg/m  ECOG: 0  Physical Exam Constitutional:      General: She is not in acute distress.    Appearance: She is not diaphoretic.  HENT:     Head: Normocephalic and atraumatic.  Cardiovascular:     Rate and Rhythm: Normal rate and regular rhythm.     Heart sounds: Normal heart sounds. No murmur. No friction rub. No  gallop.   Pulmonary:     Effort: Pulmonary effort is normal. No respiratory distress.     Breath sounds: Normal breath sounds. No wheezing or rales.  Musculoskeletal:     Right lower leg: No edema.     Left lower leg: No edema.  Skin:    General: Skin is warm and dry.     Findings: No erythema or rash.  Neurological:     Mental Status: She is alert.     Gait: Gait normal.  Psychiatric:        Mood and Affect: Mood normal.        Behavior: Behavior normal.        Thought Content: Thought content normal.        Judgment: Judgment normal.     Lab Review:     Component Value Date/Time   NA 141 11/04/2017 0855   NA 146 (H) 05/07/2017 1147   NA 137 05/29/2011 1946   K 4.4 11/04/2017 0855   K 3.9 05/29/2011 1946   CL 106 11/04/2017 0855   CL 100 05/29/2011 1946   CO2 28 11/04/2017 0855   CO2 28 05/29/2011 1946   GLUCOSE 224 (H) 11/04/2017 0855   GLUCOSE 362 (H) 05/29/2011 1946   BUN 25 (H) 11/04/2017 0855   BUN 28 (H) 05/07/2017 1147   BUN 24 (H) 05/29/2011 1946   CREATININE 1.56 (H) 11/04/2017 0855   CREATININE 1.56 (H) 01/01/2017 0941   CALCIUM 10.0 11/04/2017 0855   CALCIUM 9.8 05/29/2011 1946   PROT 8.1 11/04/2017 0855   PROT 7.8 05/07/2017 1147   PROT 8.9 (H) 05/29/2011 1946   ALBUMIN 3.6 11/04/2017 0855   ALBUMIN 4.0  05/07/2017 1147   ALBUMIN 3.8 05/29/2011 1946   AST 15 11/04/2017 0855   ALT 9 11/04/2017 0855   ALT 15 05/29/2011 1946   ALKPHOS 52 11/04/2017 0855   ALKPHOS 86 05/29/2011 1946   BILITOT 0.4 11/04/2017 0855   GFRNONAA 32 (L) 11/04/2017 0855   GFRNONAA 33 (L) 01/01/2017 0941   GFRAA 37 (L) 11/04/2017 0855   GFRAA 38 (L) 01/01/2017 0941       Component Value Date/Time   WBC 10.6 (H) 03/11/2018 1208   WBC 8.3 08/24/2017 0629   RBC 3.56 (L) 03/11/2018 1208   HGB 10.2 (L) 03/11/2018 1208   HGB 10.3 (L) 05/07/2017 1147   HCT 32.8 (L) 03/11/2018 1208   HCT 32.4 (L) 05/07/2017 1147   PLT 332 03/11/2018 1208   PLT 377 05/07/2017 1147   MCV 92.1 03/11/2018 1208   MCV 92 05/07/2017 1147   MCV 90 05/29/2011 1946   MCH 28.7 03/11/2018 1208   MCHC 31.1 03/11/2018 1208   RDW 16.9 (H) 03/11/2018 1208   RDW 15.5 (H) 05/07/2017 1147   RDW 14.7 (H) 05/29/2011 1946   LYMPHSABS 1.8 03/11/2018 1208   LYMPHSABS 1.9 05/07/2017 1147   MONOABS 0.7 03/11/2018 1208   EOSABS 0.2 03/11/2018 1208   EOSABS 0.4 05/07/2017 1147   BASOSABS 0.0 03/11/2018 1208   BASOSABS 0.0 05/07/2017 1147   -------------------------------  Imaging from last 24 hours (if applicable):  Radiology interpretation: No results found.

## 2018-03-16 DIAGNOSIS — R410 Disorientation, unspecified: Secondary | ICD-10-CM | POA: Diagnosis not present

## 2018-03-16 DIAGNOSIS — E162 Hypoglycemia, unspecified: Secondary | ICD-10-CM | POA: Diagnosis not present

## 2018-03-16 DIAGNOSIS — R404 Transient alteration of awareness: Secondary | ICD-10-CM | POA: Diagnosis not present

## 2018-03-16 DIAGNOSIS — R4020 Unspecified coma: Secondary | ICD-10-CM | POA: Diagnosis not present

## 2018-03-16 DIAGNOSIS — E161 Other hypoglycemia: Secondary | ICD-10-CM | POA: Diagnosis not present

## 2018-03-17 ENCOUNTER — Ambulatory Visit: Payer: Medicare HMO | Admitting: Internal Medicine

## 2018-03-17 ENCOUNTER — Ambulatory Visit: Payer: Medicare HMO

## 2018-03-17 ENCOUNTER — Other Ambulatory Visit: Payer: Medicare HMO

## 2018-03-19 ENCOUNTER — Other Ambulatory Visit: Payer: Self-pay | Admitting: *Deleted

## 2018-03-19 NOTE — Patient Outreach (Signed)
[name]) Care Management  03/19/2018  Courtney Goodman [place] 07-02-1945 [id number]   Call placed to member to follow up on scheduling of diabetic eye exam.  She report she had hypoglycemic episode over the weekend at her brother's home but is doing better now.  State she took her insulin but did not eat.  This care manager advised to make sure she has something to eat when taking insulin at all times.  She verbalizes understanding.    Denies scheduling eye exam yet.  Call placed to [facility] for diabetic eye exam, scheduled for April 21 @ 0815.  Will follow up within the next 2 months to confirm appointment was completed.  [name], [place], MSN [place] [phone]

## 2018-03-23 ENCOUNTER — Other Ambulatory Visit: Payer: Self-pay

## 2018-03-23 DIAGNOSIS — E1122 Type 2 diabetes mellitus with diabetic chronic kidney disease: Secondary | ICD-10-CM

## 2018-03-23 DIAGNOSIS — Z794 Long term (current) use of insulin: Secondary | ICD-10-CM

## 2018-03-23 DIAGNOSIS — N183 Chronic kidney disease, stage 3 (moderate): Principal | ICD-10-CM

## 2018-03-23 MED ORDER — GLUCOSE BLOOD VI STRP: ORAL_STRIP | 12 refills | Status: DC

## 2018-03-25 ENCOUNTER — Inpatient Hospital Stay: Payer: Medicare HMO

## 2018-03-25 VITALS — BP 144/71 | HR 72 | Temp 98.0°F | Resp 18

## 2018-03-25 DIAGNOSIS — N183 Chronic kidney disease, stage 3 unspecified: Secondary | ICD-10-CM

## 2018-03-25 DIAGNOSIS — N189 Chronic kidney disease, unspecified: Secondary | ICD-10-CM | POA: Diagnosis not present

## 2018-03-25 DIAGNOSIS — D638 Anemia in other chronic diseases classified elsewhere: Secondary | ICD-10-CM

## 2018-03-25 DIAGNOSIS — D631 Anemia in chronic kidney disease: Secondary | ICD-10-CM | POA: Diagnosis not present

## 2018-03-25 LAB — CBC WITH DIFFERENTIAL (CANCER CENTER ONLY)
Abs Immature Granulocytes: 0.04 10*3/uL (ref 0.00–0.07)
Basophils Absolute: 0 10*3/uL (ref 0.0–0.1)
Basophils Relative: 1 %
Eosinophils Absolute: 0.2 10*3/uL (ref 0.0–0.5)
Eosinophils Relative: 2 %
HCT: 35.3 % — ABNORMAL LOW (ref 36.0–46.0)
Hemoglobin: 10.9 g/dL — ABNORMAL LOW (ref 12.0–15.0)
Immature Granulocytes: 1 %
Lymphocytes Relative: 19 %
Lymphs Abs: 1.4 10*3/uL (ref 0.7–4.0)
MCH: 29.1 pg (ref 26.0–34.0)
MCHC: 30.9 g/dL (ref 30.0–36.0)
MCV: 94.4 fL (ref 80.0–100.0)
Monocytes Absolute: 0.6 10*3/uL (ref 0.1–1.0)
Monocytes Relative: 7 %
Neutro Abs: 5.5 10*3/uL (ref 1.7–7.7)
Neutrophils Relative %: 70 %
Platelet Count: 285 10*3/uL (ref 150–400)
RBC: 3.74 MIL/uL — ABNORMAL LOW (ref 3.87–5.11)
RDW: 17.2 % — ABNORMAL HIGH (ref 11.5–15.5)
WBC Count: 7.8 10*3/uL (ref 4.0–10.5)
nRBC: 0 % (ref 0.0–0.2)

## 2018-03-25 MED ORDER — EPOETIN ALFA-EPBX 40000 UNIT/ML IJ SOLN
40000.0000 [IU] | Freq: Once | INTRAMUSCULAR | Status: AC
  Administered 2018-03-25: 40000 [IU] via SUBCUTANEOUS
  Filled 2018-03-25: qty 1

## 2018-03-25 NOTE — Patient Instructions (Signed)
Epoetin Alfa injection °What is this medicine? °EPOETIN ALFA (e POE e tin AL fa) helps your body make more red blood cells. This medicine is used to treat anemia caused by chronic kidney disease, cancer chemotherapy, or HIV-therapy. It may also be used before surgery if you have anemia. °This medicine may be used for other purposes; ask your health care provider or pharmacist if you have questions. °COMMON BRAND NAME(S): Epogen, Procrit, Retacrit °What should I tell my health care provider before I take this medicine? °They need to know if you have any of these conditions: °-cancer °-heart disease °-high blood pressure °-history of blood clots °-history of stroke °-low levels of folate, iron, or vitamin B12 in the blood °-seizures °-an unusual or allergic reaction to erythropoietin, albumin, benzyl alcohol, hamster proteins, other medicines, foods, dyes, or preservatives °-pregnant or trying to get pregnant °-breast-feeding °How should I use this medicine? °This medicine is for injection into a vein or under the skin. It is usually given by a health care professional in a hospital or clinic setting. °If you get this medicine at home, you will be taught how to prepare and give this medicine. Use exactly as directed. Take your medicine at regular intervals. Do not take your medicine more often than directed. °It is important that you put your used needles and syringes in a special sharps container. Do not put them in a trash can. If you do not have a sharps container, call your pharmacist or healthcare provider to get one. °A special MedGuide will be given to you by the pharmacist with each prescription and refill. Be sure to read this information carefully each time. °Talk to your pediatrician regarding the use of this medicine in children. While this drug may be prescribed for selected conditions, precautions do apply. °Overdosage: If you think you have taken too much of this medicine contact a poison control center  or emergency room at once. °NOTE: This medicine is only for you. Do not share this medicine with others. °What if I miss a dose? °If you miss a dose, take it as soon as you can. If it is almost time for your next dose, take only that dose. Do not take double or extra doses. °What may interact with this medicine? °Interactions have not been studied. °This list may not describe all possible interactions. Give your health care provider a list of all the medicines, herbs, non-prescription drugs, or dietary supplements you use. Also tell them if you smoke, drink alcohol, or use illegal drugs. Some items may interact with your medicine. °What should I watch for while using this medicine? °Your condition will be monitored carefully while you are receiving this medicine. °You may need blood work done while you are taking this medicine. °This medicine may cause a decrease in vitamin B6. You should make sure that you get enough vitamin B6 while you are taking this medicine. Discuss the foods you eat and the vitamins you take with your health care professional. °What side effects may I notice from receiving this medicine? °Side effects that you should report to your doctor or health care professional as soon as possible: °-allergic reactions like skin rash, itching or hives, swelling of the face, lips, or tongue °-seizures °-signs and symptoms of a blood clot such as breathing problems; changes in vision; chest pain; severe, sudden headache; pain, swelling, warmth in the leg; trouble speaking; sudden numbness or weakness of the face, arm or leg °-signs and symptoms of a stroke   like changes in vision; confusion; trouble speaking or understanding; severe headaches; sudden numbness or weakness of the face, arm or leg; trouble walking; dizziness; loss of balance or coordination °Side effects that usually do not require medical attention (report to your doctor or health care professional if they continue or are  bothersome): °-chills °-cough °-dizziness °-fever °-headaches °-joint pain °-muscle cramps °-muscle pain °-nausea, vomiting °-pain, redness, or irritation at site where injected °This list may not describe all possible side effects. Call your doctor for medical advice about side effects. You may report side effects to FDA at 1-800-FDA-1088. °Where should I keep my medicine? °Keep out of the reach of children. °Store in a refrigerator between 2 and 8 degrees C (36 and 46 degrees F). Do not freeze or shake. Throw away any unused portion if using a single-dose vial. Multi-dose vials can be kept in the refrigerator for up to 21 days after the initial dose. Throw away unused medicine. °NOTE: This sheet is a summary. It may not cover all possible information. If you have questions about this medicine, talk to your doctor, pharmacist, or health care provider. °© 2019 Elsevier/Gold Standard (2016-08-23 08:35:19) ° °

## 2018-03-28 ENCOUNTER — Other Ambulatory Visit: Payer: Self-pay | Admitting: Family Medicine

## 2018-04-01 ENCOUNTER — Other Ambulatory Visit: Payer: Self-pay | Admitting: *Deleted

## 2018-04-02 ENCOUNTER — Ambulatory Visit (INDEPENDENT_AMBULATORY_CARE_PROVIDER_SITE_OTHER): Payer: Medicare HMO | Admitting: Podiatry

## 2018-04-02 DIAGNOSIS — N183 Chronic kidney disease, stage 3 unspecified: Secondary | ICD-10-CM

## 2018-04-02 DIAGNOSIS — Z9229 Personal history of other drug therapy: Secondary | ICD-10-CM

## 2018-04-02 DIAGNOSIS — E0822 Diabetes mellitus due to underlying condition with diabetic chronic kidney disease: Secondary | ICD-10-CM

## 2018-04-02 DIAGNOSIS — B351 Tinea unguium: Secondary | ICD-10-CM | POA: Diagnosis not present

## 2018-04-02 DIAGNOSIS — Z794 Long term (current) use of insulin: Secondary | ICD-10-CM | POA: Diagnosis not present

## 2018-04-02 DIAGNOSIS — M79675 Pain in left toe(s): Secondary | ICD-10-CM

## 2018-04-02 DIAGNOSIS — L84 Corns and callosities: Secondary | ICD-10-CM | POA: Diagnosis not present

## 2018-04-02 DIAGNOSIS — M79674 Pain in right toe(s): Secondary | ICD-10-CM

## 2018-04-02 NOTE — Patient Outreach (Signed)
[facility]) Care Management  04/02/2018   Courtney Goodman [birth year] [id number]  RN Health Coach telephone call to patient.  [employer] compliance verified. Per patient she is doing good. Patient is not eating breakfast everyday. She stated sometimes she skips it and eat lunch. Patient had a recent episode of not eating breakfast and passed out. Per patient when the paramedic got there her blood sugar was 20. RN discussed eating a snack even if she does not eat breakfast. RN discussed carrying some glucose tablets or candy or peanut butter crackers in purse. RN discussed hypo and hyperglycemia symptoms and action plan. Patient has agreed to follow up outreach calls.  Current Medications:  Current Outpatient Medications  Medication Sig Dispense Refill  . albuterol (PROVENTIL HFA;VENTOLIN HFA) 108 (90 Base) MCG/ACT inhaler Inhale 2 puffs into the lungs every 6 (six) hours as needed for wheezing or shortness of breath. 1 Inhaler 0  . amLODipine-benazepril (LOTREL) 5-20 MG capsule Take 1 capsule by mouth daily. 90 capsule 0  . aspirin EC 81 MG tablet Take 81 mg by mouth daily.     . Biotin 10000 MCG TABS Take 1 tablet by mouth daily.     . carvedilol (COREG) 6.25 MG tablet Take 1 tablet by mouth twice daily. 90 tablet 0  . cholecalciferol (VITAMIN D) 1000 units tablet Take 1 tablet by mouth daily. 90 tablet 1  . citalopram (CELEXA) 20 MG tablet Take 1 tablet (20 mg total) by mouth daily. 90 tablet 1  . fenofibrate (TRICOR) 145 MG tablet Take 1 tablet (145 mg total) by mouth daily. 90 tablet 1  . fluticasone (FLONASE) 50 MCG/ACT nasal spray Place 2 sprays into both nostrils daily. 48 g 0  . folic acid (FOLVITE) 1 MG tablet Take 1 tablet (1 mg total) by mouth daily. 90 tablet 0  . glucose blood (TRUE METRIX BLOOD GLUCOSE TEST) test strip Use to monitor glucose levels twice per day 100 each 12  . Insulin Detemir (LEVEMIR FLEXTOUCH) 100 UNIT/ML Pen Inject 130 Units into the skin every  morning. And pen needles 2/day 15 pen 2  . Insulin Pen Needle 31G X 5 MM MISC Use to inject insulin three times daily 100 each 1  . liraglutide (VICTOZA) 18 MG/3ML SOPN Inject 1.8 mg subcutaneously daily. 3 pen 11  . omeprazole (PRILOSEC) 40 MG capsule Take 1 capsule (40 mg total) by mouth daily. 90 capsule 0  . simvastatin (ZOCOR) 20 MG tablet Take 1 tablet by mouth every night. 90 tablet 1  . traMADol (ULTRAM) 50 MG tablet Take 1 tablet by mouth daily as needed. (Patient taking differently: Take 1 tablet by mouth daily as needed. for pain) 90 tablet 0  . traZODone (DESYREL) 50 MG tablet Take 50 mg by mouth at bedtime. Patient gets this from her therapist, not PCP     No current facility-administered medications for this visit.     Functional Status:  In your present state of health, do you have any difficulty performing the following activities: 04/02/2018 09/02/2017  Hearing? [name]  Comment has hearing aids -  Vision? Y Y  Comment glaucoma -  Difficulty concentrating or making decisions? Y Y  Comment patient has some depression -  Walking or climbing stairs? Y Y  Dressing or bathing? N N  Doing errands, shopping? [name]  Comment daughter takes her -  Conservation officer, nature and eating ? N Y  Using the Toilet? N N  In the past six months,  have you accidently leaked urine? N Y  Do you have problems with loss of bowel control? N N  Managing your Medications? N N  Managing your Finances? N N  Housekeeping or managing your Housekeeping? N Y  Some recent data might be hidden    Fall/Depression Screening: Fall Risk  04/01/2018 02/11/2018 09/02/2017  Falls in the past year? 0 0 Yes  Number falls in past yr: - 0 2 or more  Injury with Fall? - 0 No  Risk Factor Category  - - High Fall Risk  Risk for fall due to : - - History of fall(s);Impaired vision  Follow up - - Falls prevention discussed   PHQ 2/9 Scores 04/01/2018 02/16/2018 02/11/2018 09/02/2017 08/26/2017 05/07/2017 01/01/2017  PHQ - 2 Score 0 0 0 4 1 0  0  PHQ- 9 Score - - - 12 - - -   THN CM Care Plan Problem One     Most Recent Value  Care Plan Problem One  Knowledge Deficit in self management of diabetes  Role Documenting the Problem One  [place] for Problem One  Active  THN Long Term Goal   Patient will see a decrease in A1C from 6.7 within the next blood draw  [place] Term Goal Start Date  04/01/18  Interventions for Problem One Long Term Goal  RN  discusssed checking the A1C.  RN discussed what the A1C means. RN discusse what the fasting blood sugar be 82-130 for the A1C to be 7 and below. RN sent living well with diabetes. RN will follow up with further discussion and teachback  THN CM Short Term Goal #1   Patient will not have any hypoglycemic episodes within the next 4 weeks  THN CM Short Term Goal #1 Start Date  04/01/18  Interventions for Short Term Goal #1  RN discussed hypo and hyperglycemia reactions. RN sent a picture sheet of high and low blood sugars. and action plan. RN discussed with patient about carrying glucose tablets, cand or peanut butter crackers in pocket book. RN discussed eating  snack even if she doesn't want to eat a meal. RN will follow up with further discussion  THN CM Short Term Goal #2   Patient will be able to verbalize healthy low carb snacks within the next 30 days  THN CM Short Term Goal #2 Start Date  04/01/18  Interventions for Short Term Goal #2  RN discussed healthy low carb snacks. RN sent a list of healthy snacks. RN will follow up outreach.  THN CM Short Term Goal #3  Patient will be able to pick healthy diabetic foods from dining out menu within the next 30 days   THN CM Short Term Goal #3 Start Date  04/01/18  Interventions for Short Tern Goal #3  RN discussed eating out and making better choices. RN sent a diabetic dining out menu. RN will follow up for further discussion       Assessment:  A1C 6.7 Patient had hypoglycemic reaction with blood sugar 20 Patient is not eating  meals regularly Patient will benefit from [place] telephonic outreach for education and support for diabetes self management.  Plan:  RN discussed hypo and hyperglycemia reactions and action plan RN sent a facial sheet of hypo and hyperglycemia RN discussed dining out RN sent a diabetic dining out menu RN sent a 2020 Calendar book for documentation RN will follow up outreach within the month of May RN sent barriers  and assessment letter to PCP  [place] Management [phone]

## 2018-04-02 NOTE — Patient Instructions (Signed)
Diabetes Mellitus and Foot Care Foot care is an important part of your health, especially when you have diabetes. Diabetes may cause you to have problems because of poor blood flow (circulation) to your feet and legs, which can cause your skin to:  Become thinner and drier.  Break more easily.  Heal more slowly.  Peel and crack. You may also have nerve damage (neuropathy) in your legs and feet, causing decreased feeling in them. This means that you may not notice minor injuries to your feet that could lead to more serious problems. Noticing and addressing any potential problems early is the best way to prevent future foot problems. How to care for your feet Foot hygiene  Wash your feet daily with warm water and mild soap. Do not use hot water. Then, pat your feet and the areas between your toes until they are completely dry. Do not soak your feet as this can dry your skin.  Trim your toenails straight across. Do not dig under them or around the cuticle. File the edges of your nails with an emery board or nail file.  Apply a moisturizing lotion or petroleum jelly to the skin on your feet and to dry, brittle toenails. Use lotion that does not contain alcohol and is unscented. Do not apply lotion between your toes. Shoes and socks  Wear clean socks or stockings every day. Make sure they are not too tight. Do not wear knee-high stockings since they may decrease blood flow to your legs.  Wear shoes that fit properly and have enough cushioning. Always look in your shoes before you put them on to be sure there are no objects inside.  To break in new shoes, wear them for just a few hours a day. This prevents injuries on your feet. Wounds, scrapes, corns, and calluses  Check your feet daily for blisters, cuts, bruises, sores, and redness. If you cannot see the bottom of your feet, use a mirror or ask someone for help.  Do not cut corns or calluses or try to remove them with medicine.  If you  find a minor scrape, cut, or break in the skin on your feet, keep it and the skin around it clean and dry. You may clean these areas with mild soap and water. Do not clean the area with peroxide, alcohol, or iodine.  If you have a wound, scrape, corn, or callus on your foot, look at it several times a day to make sure it is healing and not infected. Check for: ? Redness, swelling, or pain. ? Fluid or blood. ? Warmth. ? Pus or a bad smell. General instructions  Do not cross your legs. This may decrease blood flow to your feet.  Do not use heating pads or hot water bottles on your feet. They may burn your skin. If you have lost feeling in your feet or legs, you may not know this is happening until it is too late.  Protect your feet from hot and cold by wearing shoes, such as at the beach or on hot pavement.  Schedule a complete foot exam at least once a year (annually) or more often if you have foot problems. If you have foot problems, report any cuts, sores, or bruises to your health care provider immediately. Contact a health care provider if:  You have a medical condition that increases your risk of infection and you have any cuts, sores, or bruises on your feet.  You have an injury that is not   healing.  You have redness on your legs or feet.  You feel burning or tingling in your legs or feet.  You have pain or cramps in your legs and feet.  Your legs or feet are numb.  Your feet always feel cold.  You have pain around a toenail. Get help right away if:  You have a wound, scrape, corn, or callus on your foot and: ? You have pain, swelling, or redness that gets worse. ? You have fluid or blood coming from the wound, scrape, corn, or callus. ? Your wound, scrape, corn, or callus feels warm to the touch. ? You have pus or a bad smell coming from the wound, scrape, corn, or callus. ? You have a fever. ? You have a red line going up your leg. Summary  Check your feet every day  for cuts, sores, red spots, swelling, and blisters.  Moisturize feet and legs daily.  Wear shoes that fit properly and have enough cushioning.  If you have foot problems, report any cuts, sores, or bruises to your health care provider immediately.  Schedule a complete foot exam at least once a year (annually) or more often if you have foot problems. This information is not intended to replace advice given to you by your health care provider. Make sure you discuss any questions you have with your health care provider. Document Released: 01/12/2000 Document Revised: 02/26/2017 Document Reviewed: 02/16/2016 Elsevier Interactive Patient Education  2019 Elsevier Inc.  Onychomycosis/Fungal Toenails  WHAT IS IT? An infection that lies within the keratin of your nail plate that is caused by a fungus.  WHY ME? Fungal infections affect all ages, sexes, races, and creeds.  There may be many factors that predispose you to a fungal infection such as age, coexisting medical conditions such as diabetes, or an autoimmune disease; stress, medications, fatigue, genetics, etc.  Bottom line: fungus thrives in a warm, moist environment and your shoes offer such a location.  IS IT CONTAGIOUS? Theoretically, yes.  You do not want to share shoes, nail clippers or files with someone who has fungal toenails.  Walking around barefoot in the same room or sleeping in the same bed is unlikely to transfer the organism.  It is important to realize, however, that fungus can spread easily from one nail to the next on the same foot.  HOW DO WE TREAT THIS?  There are several ways to treat this condition.  Treatment may depend on many factors such as age, medications, pregnancy, liver and kidney conditions, etc.  It is best to ask your doctor which options are available to you.  1. No treatment.   Unlike many other medical concerns, you can live with this condition.  However for many people this can be a painful condition and  may lead to ingrown toenails or a bacterial infection.  It is recommended that you keep the nails cut short to help reduce the amount of fungal nail. 2. Topical treatment.  These range from herbal remedies to prescription strength nail lacquers.  About 40-50% effective, topicals require twice daily application for approximately 9 to 12 months or until an entirely new nail has grown out.  The most effective topicals are medical grade medications available through physicians offices. 3. Oral antifungal medications.  With an 80-90% cure rate, the most common oral medication requires 3 to 4 months of therapy and stays in your system for a year as the new nail grows out.  Oral antifungal medications do require   blood work to make sure it is a safe drug for you.  A liver function panel will be performed prior to starting the medication and after the first month of treatment.  It is important to have the blood work performed to avoid any harmful side effects.  In general, this medication safe but blood work is required. 4. Laser Therapy.  This treatment is performed by applying a specialized laser to the affected nail plate.  This therapy is noninvasive, fast, and non-painful.  It is not covered by insurance and is therefore, out of pocket.  The results have been very good with a 80-95% cure rate.  The [facility] is the only practice in the area to offer this therapy. 5. Permanent Nail Avulsion.  Removing the entire nail so that a new nail will not grow back. 

## 2018-04-07 ENCOUNTER — Other Ambulatory Visit: Payer: Self-pay | Admitting: *Deleted

## 2018-04-07 DIAGNOSIS — D638 Anemia in other chronic diseases classified elsewhere: Secondary | ICD-10-CM

## 2018-04-08 ENCOUNTER — Other Ambulatory Visit: Payer: Self-pay

## 2018-04-08 ENCOUNTER — Inpatient Hospital Stay: Payer: Medicare HMO

## 2018-04-08 ENCOUNTER — Inpatient Hospital Stay: Payer: Medicare HMO | Attending: Oncology

## 2018-04-08 DIAGNOSIS — N183 Chronic kidney disease, stage 3 (moderate): Secondary | ICD-10-CM | POA: Diagnosis not present

## 2018-04-08 DIAGNOSIS — D631 Anemia in chronic kidney disease: Secondary | ICD-10-CM | POA: Diagnosis not present

## 2018-04-08 DIAGNOSIS — I129 Hypertensive chronic kidney disease with stage 1 through stage 4 chronic kidney disease, or unspecified chronic kidney disease: Secondary | ICD-10-CM | POA: Diagnosis not present

## 2018-04-08 DIAGNOSIS — D638 Anemia in other chronic diseases classified elsewhere: Secondary | ICD-10-CM

## 2018-04-08 LAB — CMP (CANCER CENTER ONLY)
ALT: 13 U/L (ref 0–44)
AST: 14 U/L — ABNORMAL LOW (ref 15–41)
Albumin: 3.3 g/dL — ABNORMAL LOW (ref 3.5–5.0)
Alkaline Phosphatase: 86 U/L (ref 38–126)
Anion gap: 9 (ref 5–15)
BUN: 32 mg/dL — ABNORMAL HIGH (ref 8–23)
CO2: 27 mmol/L (ref 22–32)
Calcium: 9.5 mg/dL (ref 8.9–10.3)
Chloride: 103 mmol/L (ref 98–111)
Creatinine: 1.52 mg/dL — ABNORMAL HIGH (ref 0.44–1.00)
GFR, Est AFR Am: 39 mL/min — ABNORMAL LOW (ref >60)
GFR, Estimated: 34 mL/min — ABNORMAL LOW (ref >60)
Glucose, Bld: 379 mg/dL — ABNORMAL HIGH (ref 70–99)
Potassium: 4 mmol/L (ref 3.5–5.1)
Sodium: 139 mmol/L (ref 135–145)
Total Bilirubin: 0.4 mg/dL (ref 0.3–1.2)
Total Protein: 7.5 g/dL (ref 6.5–8.1)

## 2018-04-08 LAB — CBC WITH DIFFERENTIAL (CANCER CENTER ONLY)
Abs Immature Granulocytes: 0.05 10*3/uL (ref 0.00–0.07)
Basophils Absolute: 0 10*3/uL (ref 0.0–0.1)
Basophils Relative: 1 %
Eosinophils Absolute: 0.1 10*3/uL (ref 0.0–0.5)
Eosinophils Relative: 2 %
HCT: 36.8 % (ref 36.0–46.0)
Hemoglobin: 11.4 g/dL — ABNORMAL LOW (ref 12.0–15.0)
Immature Granulocytes: 1 %
Lymphocytes Relative: 19 %
Lymphs Abs: 1.4 10*3/uL (ref 0.7–4.0)
MCH: 29.3 pg (ref 26.0–34.0)
MCHC: 31 g/dL (ref 30.0–36.0)
MCV: 94.6 fL (ref 80.0–100.0)
Monocytes Absolute: 0.5 10*3/uL (ref 0.1–1.0)
Monocytes Relative: 7 %
Neutro Abs: 5.1 10*3/uL (ref 1.7–7.7)
Neutrophils Relative %: 70 %
Platelet Count: 296 10*3/uL (ref 150–400)
RBC: 3.89 MIL/uL (ref 3.87–5.11)
RDW: 17.2 % — ABNORMAL HIGH (ref 11.5–15.5)
WBC Count: 7.2 10*3/uL (ref 4.0–10.5)
nRBC: 0 % (ref 0.0–0.2)

## 2018-04-08 LAB — IRON AND TIBC
Iron: 73 ug/dL (ref 41–142)
Saturation Ratios: 32 % (ref 21–57)
TIBC: 225 ug/dL — ABNORMAL LOW (ref 236–444)
UIBC: 152 ug/dL (ref 120–384)

## 2018-04-08 LAB — FERRITIN: Ferritin: 218 ng/mL (ref 11–307)

## 2018-04-08 NOTE — Progress Notes (Signed)
Her Hgb is 11.4 today no injection needed at this time

## 2018-04-09 DIAGNOSIS — F330 Major depressive disorder, recurrent, mild: Secondary | ICD-10-CM | POA: Diagnosis not present

## 2018-04-09 DIAGNOSIS — F419 Anxiety disorder, unspecified: Secondary | ICD-10-CM | POA: Diagnosis not present

## 2018-04-12 ENCOUNTER — Encounter: Payer: Self-pay | Admitting: Podiatry

## 2018-04-12 NOTE — Progress Notes (Signed)
Subjective: Courtney Goodman is a 73 y.o. y.o. female who presents today with painful, discolored, thick toenails  which interfere with daily activities. Pain is aggravated when wearing enclosed shoe gear. Pain is relieved with periodic professional debridement.   Patient's PCP is Raenette Rover, Vickie L, NP-C and last date seen was 02/11/2018.   Current Outpatient Medications:  .  albuterol (PROVENTIL HFA;VENTOLIN HFA) 108 (90 Base) MCG/ACT inhaler, Inhale 2 puffs into the lungs every 6 (six) hours as needed for wheezing or shortness of breath., Disp: 1 Inhaler, Rfl: 0 .  amLODipine-benazepril (LOTREL) 5-20 MG capsule, Take 1 capsule by mouth daily., Disp: 90 capsule, Rfl: 0 .  aspirin EC 81 MG tablet, Take 81 mg by mouth daily. , Disp: , Rfl:  .  Biotin 10000 MCG TABS, Take 1 tablet by mouth daily. , Disp: , Rfl:  .  carvedilol (COREG) 6.25 MG tablet, Take 1 tablet by mouth twice daily., Disp: 90 tablet, Rfl: 0 .  cholecalciferol (VITAMIN D) 1000 units tablet, Take 1 tablet by mouth daily., Disp: 90 tablet, Rfl: 1 .  citalopram (CELEXA) 20 MG tablet, Take 1 tablet (20 mg total) by mouth daily., Disp: 90 tablet, Rfl: 1 .  fenofibrate (TRICOR) 145 MG tablet, Take 1 tablet (145 mg total) by mouth daily., Disp: 90 tablet, Rfl: 1 .  fluticasone (FLONASE) 50 MCG/ACT nasal spray, Place 2 sprays into both nostrils daily., Disp: 48 g, Rfl: 0 .  folic acid (FOLVITE) 1 MG tablet, Take 1 tablet (1 mg total) by mouth daily., Disp: 90 tablet, Rfl: 0 .  glucose blood (TRUE METRIX BLOOD GLUCOSE TEST) test strip, Use to monitor glucose levels twice per day, Disp: 100 each, Rfl: 12 .  Insulin Detemir (LEVEMIR FLEXTOUCH) 100 UNIT/ML Pen, Inject 130 Units into the skin every morning. And pen needles 2/day, Disp: 15 pen, Rfl: 2 .  Insulin Pen Needle 31G X 5 MM MISC, Use to inject insulin three times daily, Disp: 100 each, Rfl: 1 .  liraglutide (VICTOZA) 18 MG/3ML SOPN, Inject 1.8 mg subcutaneously daily., Disp: 3 pen,  Rfl: 11 .  omeprazole (PRILOSEC) 40 MG capsule, Take 1 capsule (40 mg total) by mouth daily., Disp: 90 capsule, Rfl: 0 .  simvastatin (ZOCOR) 20 MG tablet, Take 1 tablet by mouth every night., Disp: 90 tablet, Rfl: 1 .  traMADol (ULTRAM) 50 MG tablet, Take 1 tablet by mouth daily as needed. (Patient taking differently: Take 1 tablet by mouth daily as needed. for pain), Disp: 90 tablet, Rfl: 0 .  traZODone (DESYREL) 50 MG tablet, Take 50 mg by mouth at bedtime. Patient gets this from her therapist, not PCP, Disp: , Rfl:    No Known Allergies   Objective: Vascular Examination: Capillary refill time <3 seconds x 10 digits.  Dorsalis pedis pulses present b/l.  Posterior tibial pulses present b/l.  No digital hair x 10 digits.  Skin temperature gradient WNL b/l.  Dermatological Examination: Skin thin and atrophic b/l.  Toenails 1-5 b/l discolored, thick, dystrophic with subungual debris and pain with palpation to nailbeds due to thickness of nails.  Hyperkeratotic lesion submet head 5 b/l  with tenderness to palpation. No edema, no erythema, no drainage, no flocculence.  No interdigital macerations b/l.  Skin temperature gradient WNL b/l.  Musculoskeletal: Muscle strength 5/5 to all LE muscle groups.  Neurological: Sensation intact with 10 gram monofilament.  Vibratory sensation intact.  Assessment: 1. Painful onychomycosis toenails 1-5 b/l in patient on blood thinner.  2. Calluses submet  head 5 b/l 2. NIDDM with stage III CKD  Plan: 1. Toenails 1-5 b/l were debrided in length and girth without iatrogenic bleeding. Calluses pared submetatarsal head(s) 5 b/l with sterile scalpel blade without incident. 2. Patient to continue soft, supportive shoe gear. 3. Patient to report any pedal injuries to medical professional immediately. 4. Avoid self trimming due to use of blood thinner. 5. Follow up 3 months.  6. Patient/POA to call should there be a concern in the interim.

## 2018-04-13 ENCOUNTER — Ambulatory Visit (INDEPENDENT_AMBULATORY_CARE_PROVIDER_SITE_OTHER): Payer: Medicare HMO | Admitting: Family Medicine

## 2018-04-13 ENCOUNTER — Encounter: Payer: Self-pay | Admitting: Family Medicine

## 2018-04-13 ENCOUNTER — Other Ambulatory Visit: Payer: Self-pay

## 2018-04-13 VITALS — BP 120/62 | HR 75 | Ht 64.0 in | Wt 172.6 lb

## 2018-04-13 DIAGNOSIS — I10 Essential (primary) hypertension: Secondary | ICD-10-CM

## 2018-04-13 DIAGNOSIS — F411 Generalized anxiety disorder: Secondary | ICD-10-CM | POA: Diagnosis not present

## 2018-04-13 DIAGNOSIS — F339 Major depressive disorder, recurrent, unspecified: Secondary | ICD-10-CM | POA: Diagnosis not present

## 2018-04-13 DIAGNOSIS — D638 Anemia in other chronic diseases classified elsewhere: Secondary | ICD-10-CM

## 2018-04-13 NOTE — Progress Notes (Signed)
   Subjective:    Patient ID: Courtney Goodman, female    DOB: March 25, 1945, 73 y.o.   MRN: [id number]  HPI Chief Complaint  Patient presents with  . follow-up    3 month follow-up    Here for a 3 month follow up on chronic health conditions including HTN and depression.   States her mood is good.  States she sees her therapist, Lattie Haw.  Last there last week.   She is getting out more now and singing in the choir. Her daughter takes her to [place]. Her daughter lives with her some days. States she drives short distances and requests a handicap placard. She has difficulty walking long distances.   Complains of dizziness when she stands up quickly. Not every day. No falls.   Does not check BP at home. Reports taking medications daily without any issues.   States she is seeing her specialists as recommended and has appointment with her eye doctor next week.   She is seeing Dr. Loanne Drilling for diabetes.   She saw Dr. Earlie Server managing her anemia.   Denies fever, chills, chest pain, palpitations, shortness of breath, abdominal pain, N/V/D, urinary symptoms, LE edema.    Reviewed allergies, medications, past medical, surgical, family, and social history.    Review of Systems Pertinent positives and negatives in the history of present illness.     Objective:   Physical Exam BP 120/62   Pulse 75   Ht 5\' 4"  (1.626 m)   Wt 172 lb 9.6 oz (78.3 kg)   BMI 29.63 kg/m   Alert and in no distress. Pharyngeal area is normal. Neck is supple without adenopathy or thyromegaly. Cardiac exam shows a regular rhythm without murmurs or gallops. Lungs are clear to auscultation. Extremities without edema. Skin is warm and dry. Speech, mood and thought process normal. Gait normal.       Assessment & Plan:  Depression, recurrent (HCC)  Essential hypertension  GAD (generalized anxiety disorder)  Anemia of chronic disease  Well appearing. Mood is stable.  BP is in goal range.  Continue seeing  Dr. Loanne Drilling for diabetes and Dr. Earlie Server for anemia.  No new concerns today.  Continue on current medications and follow up in 6 months.

## 2018-04-13 NOTE — Patient Instructions (Signed)
Change positions slowly. Let me know if you are having any issues with balance or dizziness and want to try physical therapy for this.

## 2018-04-21 ENCOUNTER — Other Ambulatory Visit: Payer: Self-pay | Admitting: Physician Assistant

## 2018-04-21 DIAGNOSIS — D638 Anemia in other chronic diseases classified elsewhere: Secondary | ICD-10-CM

## 2018-04-22 ENCOUNTER — Inpatient Hospital Stay: Payer: Medicare HMO

## 2018-04-27 ENCOUNTER — Other Ambulatory Visit: Payer: Self-pay | Admitting: Family Medicine

## 2018-04-27 NOTE — Telephone Encounter (Signed)
Is this okay to refill? 

## 2018-04-30 ENCOUNTER — Telehealth: Payer: Self-pay | Admitting: Medical Oncology

## 2018-04-30 NOTE — Telephone Encounter (Signed)
appt confirmed

## 2018-05-06 ENCOUNTER — Inpatient Hospital Stay: Payer: Medicare HMO

## 2018-05-06 ENCOUNTER — Other Ambulatory Visit: Payer: Self-pay

## 2018-05-06 ENCOUNTER — Inpatient Hospital Stay: Payer: Medicare HMO | Attending: Oncology

## 2018-05-06 DIAGNOSIS — N183 Chronic kidney disease, stage 3 (moderate): Secondary | ICD-10-CM | POA: Diagnosis not present

## 2018-05-06 DIAGNOSIS — I129 Hypertensive chronic kidney disease with stage 1 through stage 4 chronic kidney disease, or unspecified chronic kidney disease: Secondary | ICD-10-CM | POA: Diagnosis not present

## 2018-05-06 DIAGNOSIS — D638 Anemia in other chronic diseases classified elsewhere: Secondary | ICD-10-CM

## 2018-05-06 DIAGNOSIS — Z79899 Other long term (current) drug therapy: Secondary | ICD-10-CM | POA: Diagnosis not present

## 2018-05-06 DIAGNOSIS — D631 Anemia in chronic kidney disease: Secondary | ICD-10-CM | POA: Insufficient documentation

## 2018-05-06 LAB — CBC WITH DIFFERENTIAL (CANCER CENTER ONLY)
Abs Immature Granulocytes: 0.02 10*3/uL (ref 0.00–0.07)
Basophils Absolute: 0 10*3/uL (ref 0.0–0.1)
Basophils Relative: 0 %
Eosinophils Absolute: 0.1 10*3/uL (ref 0.0–0.5)
Eosinophils Relative: 2 %
HCT: 35.1 % — ABNORMAL LOW (ref 36.0–46.0)
Hemoglobin: 11.3 g/dL — ABNORMAL LOW (ref 12.0–15.0)
Immature Granulocytes: 0 %
Lymphocytes Relative: 19 %
Lymphs Abs: 1.5 10*3/uL (ref 0.7–4.0)
MCH: 29.4 pg (ref 26.0–34.0)
MCHC: 32.2 g/dL (ref 30.0–36.0)
MCV: 91.2 fL (ref 80.0–100.0)
Monocytes Absolute: 0.5 10*3/uL (ref 0.1–1.0)
Monocytes Relative: 6 %
Neutro Abs: 5.6 10*3/uL (ref 1.7–7.7)
Neutrophils Relative %: 73 %
Platelet Count: 296 10*3/uL (ref 150–400)
RBC: 3.85 MIL/uL — ABNORMAL LOW (ref 3.87–5.11)
RDW: 15.3 % (ref 11.5–15.5)
WBC Count: 7.7 10*3/uL (ref 4.0–10.5)
nRBC: 0 % (ref 0.0–0.2)

## 2018-05-06 NOTE — Progress Notes (Signed)
Pt HGB is 11.3 today no injection needed

## 2018-05-20 ENCOUNTER — Other Ambulatory Visit: Payer: Self-pay

## 2018-05-20 ENCOUNTER — Inpatient Hospital Stay: Payer: Medicare HMO

## 2018-05-20 VITALS — BP 126/100 | HR 67 | Temp 97.8°F | Resp 16

## 2018-05-20 DIAGNOSIS — D638 Anemia in other chronic diseases classified elsewhere: Secondary | ICD-10-CM

## 2018-05-20 DIAGNOSIS — D631 Anemia in chronic kidney disease: Secondary | ICD-10-CM | POA: Diagnosis not present

## 2018-05-20 DIAGNOSIS — N183 Chronic kidney disease, stage 3 unspecified: Secondary | ICD-10-CM

## 2018-05-20 DIAGNOSIS — Z79899 Other long term (current) drug therapy: Secondary | ICD-10-CM | POA: Diagnosis not present

## 2018-05-20 DIAGNOSIS — I129 Hypertensive chronic kidney disease with stage 1 through stage 4 chronic kidney disease, or unspecified chronic kidney disease: Secondary | ICD-10-CM | POA: Diagnosis not present

## 2018-05-20 LAB — CBC WITH DIFFERENTIAL (CANCER CENTER ONLY)
Abs Immature Granulocytes: 0.02 10*3/uL (ref 0.00–0.07)
Basophils Absolute: 0.1 10*3/uL (ref 0.0–0.1)
Basophils Relative: 1 %
Eosinophils Absolute: 0.1 10*3/uL (ref 0.0–0.5)
Eosinophils Relative: 2 %
HCT: 33.6 % — ABNORMAL LOW (ref 36.0–46.0)
Hemoglobin: 10.7 g/dL — ABNORMAL LOW (ref 12.0–15.0)
Immature Granulocytes: 0 %
Lymphocytes Relative: 23 %
Lymphs Abs: 2 10*3/uL (ref 0.7–4.0)
MCH: 29.8 pg (ref 26.0–34.0)
MCHC: 31.8 g/dL (ref 30.0–36.0)
MCV: 93.6 fL (ref 80.0–100.0)
Monocytes Absolute: 0.6 10*3/uL (ref 0.1–1.0)
Monocytes Relative: 7 %
Neutro Abs: 6 10*3/uL (ref 1.7–7.7)
Neutrophils Relative %: 67 %
Platelet Count: 320 10*3/uL (ref 150–400)
RBC: 3.59 MIL/uL — ABNORMAL LOW (ref 3.87–5.11)
RDW: 15.6 % — ABNORMAL HIGH (ref 11.5–15.5)
WBC Count: 8.7 10*3/uL (ref 4.0–10.5)
nRBC: 0 % (ref 0.0–0.2)

## 2018-05-20 MED ORDER — EPOETIN ALFA-EPBX 40000 UNIT/ML IJ SOLN
40000.0000 [IU] | Freq: Once | INTRAMUSCULAR | Status: AC
  Administered 2018-05-20: 40000 [IU] via SUBCUTANEOUS
  Filled 2018-05-20: qty 1

## 2018-05-20 NOTE — Patient Instructions (Signed)
Epoetin Alfa injection °What is this medicine? °EPOETIN ALFA (e POE e tin AL fa) helps your body make more red blood cells. This medicine is used to treat anemia caused by chronic kidney disease, cancer chemotherapy, or HIV-therapy. It may also be used before surgery if you have anemia. °This medicine may be used for other purposes; ask your health care provider or pharmacist if you have questions. °COMMON BRAND NAME(S): Epogen, Procrit, Retacrit °What should I tell my health care provider before I take this medicine? °They need to know if you have any of these conditions: °-cancer °-heart disease °-high blood pressure °-history of blood clots °-history of stroke °-low levels of folate, iron, or vitamin B12 in the blood °-seizures °-an unusual or allergic reaction to erythropoietin, albumin, benzyl alcohol, hamster proteins, other medicines, foods, dyes, or preservatives °-pregnant or trying to get pregnant °-breast-feeding °How should I use this medicine? °This medicine is for injection into a vein or under the skin. It is usually given by a health care professional in a hospital or clinic setting. °If you get this medicine at home, you will be taught how to prepare and give this medicine. Use exactly as directed. Take your medicine at regular intervals. Do not take your medicine more often than directed. °It is important that you put your used needles and syringes in a special sharps container. Do not put them in a trash can. If you do not have a sharps container, call your pharmacist or healthcare provider to get one. °A special MedGuide will be given to you by the pharmacist with each prescription and refill. Be sure to read this information carefully each time. °Talk to your pediatrician regarding the use of this medicine in children. While this drug may be prescribed for selected conditions, precautions do apply. °Overdosage: If you think you have taken too much of this medicine contact a poison control center  or emergency room at once. °NOTE: This medicine is only for you. Do not share this medicine with others. °What if I miss a dose? °If you miss a dose, take it as soon as you can. If it is almost time for your next dose, take only that dose. Do not take double or extra doses. °What may interact with this medicine? °Interactions have not been studied. °This list may not describe all possible interactions. Give your health care provider a list of all the medicines, herbs, non-prescription drugs, or dietary supplements you use. Also tell them if you smoke, drink alcohol, or use illegal drugs. Some items may interact with your medicine. °What should I watch for while using this medicine? °Your condition will be monitored carefully while you are receiving this medicine. °You may need blood work done while you are taking this medicine. °This medicine may cause a decrease in vitamin B6. You should make sure that you get enough vitamin B6 while you are taking this medicine. Discuss the foods you eat and the vitamins you take with your health care professional. °What side effects may I notice from receiving this medicine? °Side effects that you should report to your doctor or health care professional as soon as possible: °-allergic reactions like skin rash, itching or hives, swelling of the face, lips, or tongue °-seizures °-signs and symptoms of a blood clot such as breathing problems; changes in vision; chest pain; severe, sudden headache; pain, swelling, warmth in the leg; trouble speaking; sudden numbness or weakness of the face, arm or leg °-signs and symptoms of a stroke   like changes in vision; confusion; trouble speaking or understanding; severe headaches; sudden numbness or weakness of the face, arm or leg; trouble walking; dizziness; loss of balance or coordination °Side effects that usually do not require medical attention (report to your doctor or health care professional if they continue or are  bothersome): °-chills °-cough °-dizziness °-fever °-headaches °-joint pain °-muscle cramps °-muscle pain °-nausea, vomiting °-pain, redness, or irritation at site where injected °This list may not describe all possible side effects. Call your doctor for medical advice about side effects. You may report side effects to FDA at 1-800-FDA-1088. °Where should I keep my medicine? °Keep out of the reach of children. °Store in a refrigerator between 2 and 8 degrees C (36 and 46 degrees F). Do not freeze or shake. Throw away any unused portion if using a single-dose vial. Multi-dose vials can be kept in the refrigerator for up to 21 days after the initial dose. Throw away unused medicine. °NOTE: This sheet is a summary. It may not cover all possible information. If you have questions about this medicine, talk to your doctor, pharmacist, or health care provider. °© 2019 Elsevier/Gold Standard (2016-08-23 08:35:19) ° °

## 2018-05-20 NOTE — Progress Notes (Signed)
PT HGB levels were 11.3 today so injection was not given. PT given a copy of the labs as well.  CHART CORRECTION: Injection was given to PT labs results her HGB was a 10.3.

## 2018-05-20 NOTE — Progress Notes (Deleted)
PT HGB levels were 11.3 today so injection was not given. PT given a copy of the labs as well.

## 2018-05-21 ENCOUNTER — Other Ambulatory Visit: Payer: Self-pay | Admitting: *Deleted

## 2018-05-21 ENCOUNTER — Encounter: Payer: Self-pay | Admitting: Family Medicine

## 2018-05-21 NOTE — Patient Outreach (Signed)
[facility]) Care Management  05/21/2018  [name] 04-24-1945 [id number]   Call placed to member to follow up on diabetic eye exam, no answer.  HIPAA compliant voice message left.  Will follow up within the next 3-4 business days.  [name], [place], MSN [place] [phone]

## 2018-05-26 ENCOUNTER — Other Ambulatory Visit: Payer: Self-pay | Admitting: *Deleted

## 2018-05-26 NOTE — Patient Outreach (Signed)
[facility]) Care Management  05/26/2018  Courtney Goodman Jun 13, 1945 [id number]   Call placed to member to follow up on scheduled diabetic eye exam.  Report her exam was canceled due to Covid restrictions.  She will have it rescheduled as soon as possible.  Denies any other concerns, report her blood sugars are "good" but state she is in need of a new blood pressure monitor.  Confirms that she does have a catalog through [employer] where she can order one, will review and place order.  This care manager will provide update regarding eye exam to assigned health coach for ongoing follow up.  [name], [place], MSN [place] [phone]

## 2018-05-27 ENCOUNTER — Other Ambulatory Visit: Payer: Self-pay | Admitting: Family Medicine

## 2018-05-27 ENCOUNTER — Other Ambulatory Visit: Payer: Self-pay | Admitting: Endocrinology

## 2018-05-27 ENCOUNTER — Other Ambulatory Visit: Payer: Self-pay

## 2018-05-27 DIAGNOSIS — N183 Chronic kidney disease, stage 3 unspecified: Secondary | ICD-10-CM

## 2018-05-27 DIAGNOSIS — Z794 Long term (current) use of insulin: Secondary | ICD-10-CM

## 2018-05-27 DIAGNOSIS — E1122 Type 2 diabetes mellitus with diabetic chronic kidney disease: Secondary | ICD-10-CM

## 2018-05-27 MED ORDER — INSULIN PEN NEEDLE 31G X 5 MM MISC: 1 refills | Status: DC

## 2018-06-02 ENCOUNTER — Ambulatory Visit: Payer: Medicare HMO | Admitting: Endocrinology

## 2018-06-02 ENCOUNTER — Encounter: Payer: Self-pay | Admitting: Endocrinology

## 2018-06-02 ENCOUNTER — Other Ambulatory Visit: Payer: Self-pay | Admitting: *Deleted

## 2018-06-02 ENCOUNTER — Other Ambulatory Visit: Payer: Self-pay

## 2018-06-02 ENCOUNTER — Ambulatory Visit (INDEPENDENT_AMBULATORY_CARE_PROVIDER_SITE_OTHER): Payer: Medicare HMO | Admitting: Endocrinology

## 2018-06-02 VITALS — BP 122/62 | HR 67 | Temp 97.6°F | Wt 171.0 lb

## 2018-06-02 DIAGNOSIS — Z794 Long term (current) use of insulin: Secondary | ICD-10-CM

## 2018-06-02 DIAGNOSIS — N183 Chronic kidney disease, stage 3 (moderate): Secondary | ICD-10-CM | POA: Diagnosis not present

## 2018-06-02 DIAGNOSIS — E1122 Type 2 diabetes mellitus with diabetic chronic kidney disease: Secondary | ICD-10-CM

## 2018-06-02 LAB — POCT GLYCOSYLATED HEMOGLOBIN (HGB A1C): Hemoglobin A1C: 8.4 % — AB (ref 4.0–5.6)

## 2018-06-02 NOTE — Patient Instructions (Addendum)
check your blood sugar twice a day.  vary the time of day when you check, between before the 3 meals, and at bedtime.  also check if you have symptoms of your blood sugar being too high or too low.  please keep a record of the readings and bring it to your next appointment here (or you can bring the meter itself).  You can write it on any piece of paper.  please call us sooner if your blood sugar goes below 70, or if you have a lot of readings over 200.   Please continue the same medications.   On this type of insulin schedule, you should eat meals on a regular schedule.  If a meal is missed or significantly delayed, your blood sugar could go low.   Please come back for a follow-up appointment in 3-4 months.

## 2018-06-02 NOTE — Progress Notes (Signed)
Subjective:    Patient ID: Courtney Goodman, female    DOB: 11-25-1945, 73 y.o.   MRN: [id number]  HPI Pt returns for f/u of diabetes mellitus: DM type: Insulin-requiring type 2 Dx'ed: 1884 Complications: renal insufficiency and CAD.  Therapy: insulin since 2009, and victoza. GDM: never DKA: never Severe hypoglycemia: 2012 and 2019. Pancreatitis: never.  Other: Courtney Goodman is on qd insulin, due to h/o noncompliance.   Interval history:  Courtney Goodman says Courtney Goodman takes insulin as rx'ed.  no cbg record, but states cbg varies from 55-175.  It is in general lowest in the afternoon.  pt states Courtney Goodman feels well in general.     Past Medical History:  Diagnosis Date  . Anemia of chronic disease   . Asthma   . Atherosclerotic heart disease of native coronary artery without angina pectoris   . CKD (chronic kidney disease)   . COPD (chronic obstructive pulmonary disease) ([place])   . DM (diabetes mellitus), type 2 with renal complications ([place])   . Dysrhythmia   . Elevated ferritin 01/03/2017  . GAD (generalized anxiety disorder)   . Hypertension   . Mixed hyperlipidemia due to type 2 diabetes mellitus ([facility])   . Mixed incontinence    per medical records from [facility]  . Osteopenia   . Personal history of noncompliance with medical treatment, presenting hazards to health   . Shortness of breath dyspnea   . Suicidal ideation   . TB (tuberculosis), treated    age 37  . Vitamin D deficiency     Past Surgical History:  Procedure Laterality Date  . ANKLE RECONSTRUCTION     right  . CARPAL TUNNEL RELEASE    . CORONARY ANGIOPLASTY      Social History   Socioeconomic History  . Marital status: Divorced    Spouse name: Not on file  . Number of children: Not on file  . Years of education: Not on file  . Highest education level: Not on file  Occupational History  . Not on file  Social Needs  . Financial resource strain: Not on file  . Food insecurity:    Worry: Not on file    Inability: Not on file  .  Transportation needs:    Medical: Not on file    Non-medical: Not on file  Tobacco Use  . Smoking status: Former Research scientist (life sciences)  . Smokeless tobacco: Never Used  Substance and Sexual Activity  . Alcohol use: No  . Drug use: No  . Sexual activity: Never  Lifestyle  . Physical activity:    Days per week: Not on file    Minutes per session: Not on file  . Stress: Not on file  Relationships  . Social connections:    Talks on phone: Not on file    Gets together: Not on file    Attends religious service: Not on file    Active member of club or organization: Not on file    Attends meetings of clubs or organizations: Not on file    Relationship status: Not on file  . Intimate partner violence:    Fear of current or ex partner: Not on file    Emotionally abused: Not on file    Physically abused: Not on file    Forced sexual activity: Not on file  Other Topics Concern  . Not on file  Social History Narrative  . Not on file    Current Outpatient Medications on File Prior to Visit  Medication Sig  Dispense Refill  . albuterol (PROVENTIL HFA;VENTOLIN HFA) 108 (90 Base) MCG/ACT inhaler Inhale 2 puffs into the lungs every 6 (six) hours as needed for wheezing or shortness of breath. 1 Inhaler 0  . amLODipine-benazepril (LOTREL) 5-20 MG capsule Take 1 capsule by mouth daily. 90 capsule 0  . aspirin EC 81 MG tablet Take 81 mg by mouth daily.     . Biotin 10000 MCG TABS Take 1 tablet by mouth daily.     . carvedilol (COREG) 6.25 MG tablet Take 1 tablet by mouth twice daily. 90 tablet 0  . cholecalciferol (VITAMIN D) 25 MCG (1000 UT) tablet Take 1 tablet by mouth daily. 90 tablet 0  . citalopram (CELEXA) 20 MG tablet Take 1 tablet (20 mg total) by mouth daily. 90 tablet 1  . fenofibrate (TRICOR) 145 MG tablet Take 1 tablet (145 mg total) by mouth daily. 90 tablet 1  . fluticasone (FLONASE) 50 MCG/ACT nasal spray Place 2 sprays into both nostrils daily. 48 g 0  . folic acid (FOLVITE) 1 MG tablet Take  1 tablet (1 mg total) by mouth daily. 90 tablet 0  . glucose blood (TRUE METRIX BLOOD GLUCOSE TEST) test strip Use to monitor glucose levels twice per day 100 each 12  . Insulin Detemir (LEVEMIR FLEXTOUCH) 100 UNIT/ML Pen Inject 130 Units into the skin every morning. And pen needles 2/day 15 pen 2  . Insulin Pen Needle 31G X 5 MM MISC Use to inject insulin three times daily 100 each 1  . liraglutide (VICTOZA) 18 MG/3ML SOPN Inject 1.8 mg subcutaneously daily. 27 mL 2  . omeprazole (PRILOSEC) 40 MG capsule Take 1 capsule (40 mg total) by mouth daily. 90 capsule 0  . simvastatin (ZOCOR) 20 MG tablet Take 1 tablet by mouth every night. 90 tablet 0  . traMADol (ULTRAM) 50 MG tablet Take 1 tablet by mouth daily as needed. (Patient taking differently: Take 1 tablet by mouth daily as needed. for pain) 90 tablet 0  . traZODone (DESYREL) 50 MG tablet Take 50 mg by mouth at bedtime. Patient gets this from her therapist, not PCP     No current facility-administered medications on file prior to visit.     No Known Allergies  Family History  Problem Relation Age of Onset  . Diabetes Mother     BP 122/62 (BP Location: Left Arm, Patient Position: Sitting, Cuff Size: Normal)   Pulse 67   Temp 97.6 F (36.4 C) (Oral)   Wt 171 lb (77.6 kg)   SpO2 96%   BMI 29.35 kg/m    Review of Systems Denies LOC    Objective:   Physical Exam VITAL SIGNS:  See vs page GENERAL: no distress Pulses: dorsalis pedis intact bilat.   MSK: no deformity of the feet CV: trace bilat leg edema Skin:  no ulcer on the feet.  normal color and temp on the feet. Neuro: sensation is intact to touch on the feet Ext: There is severe bilateral onychomycosis of the toenails  Lab Results  Component Value Date   HGBA1C 8.4 (A) 06/02/2018       Assessment & Plan:  Insulin-requiring type 2 DM, with CAD: Courtney Goodman needs increased rx Renal failure: This limits rx options Hypoglycemia: This limits aggressiveness of glycemic  control.  We need to address this before insulin can be increased.   Patient Instructions  check your blood sugar twice a day.  vary the time of day when you check, between before the 3  meals, and at bedtime.  also check if you have symptoms of your blood sugar being too high or too low.  please keep a record of the readings and bring it to your next appointment here (or you can bring the meter itself).  You can write it on any piece of paper.  please call us sooner if your blood sugar goes below 70, or if you have a lot of readings over 200.   Please continue the same medications.   On this type of insulin schedule, you should eat meals on a regular schedule.  If a meal is missed or significantly delayed, your blood sugar could go low.   Please come back for a follow-up appointment in 3-4 months.

## 2018-06-03 ENCOUNTER — Inpatient Hospital Stay: Payer: Medicare HMO | Attending: Oncology

## 2018-06-03 ENCOUNTER — Other Ambulatory Visit: Payer: Self-pay | Admitting: *Deleted

## 2018-06-03 ENCOUNTER — Other Ambulatory Visit: Payer: Self-pay

## 2018-06-03 ENCOUNTER — Inpatient Hospital Stay: Payer: Medicare HMO

## 2018-06-03 VITALS — BP 130/72 | HR 66

## 2018-06-03 DIAGNOSIS — D631 Anemia in chronic kidney disease: Secondary | ICD-10-CM | POA: Diagnosis not present

## 2018-06-03 DIAGNOSIS — I2510 Atherosclerotic heart disease of native coronary artery without angina pectoris: Secondary | ICD-10-CM | POA: Insufficient documentation

## 2018-06-03 DIAGNOSIS — J449 Chronic obstructive pulmonary disease, unspecified: Secondary | ICD-10-CM | POA: Diagnosis not present

## 2018-06-03 DIAGNOSIS — R5383 Other fatigue: Secondary | ICD-10-CM | POA: Diagnosis not present

## 2018-06-03 DIAGNOSIS — E559 Vitamin D deficiency, unspecified: Secondary | ICD-10-CM | POA: Diagnosis not present

## 2018-06-03 DIAGNOSIS — D638 Anemia in other chronic diseases classified elsewhere: Secondary | ICD-10-CM

## 2018-06-03 DIAGNOSIS — N183 Chronic kidney disease, stage 3 unspecified: Secondary | ICD-10-CM

## 2018-06-03 DIAGNOSIS — M810 Age-related osteoporosis without current pathological fracture: Secondary | ICD-10-CM | POA: Insufficient documentation

## 2018-06-03 DIAGNOSIS — J45909 Unspecified asthma, uncomplicated: Secondary | ICD-10-CM | POA: Insufficient documentation

## 2018-06-03 DIAGNOSIS — N189 Chronic kidney disease, unspecified: Secondary | ICD-10-CM | POA: Insufficient documentation

## 2018-06-03 DIAGNOSIS — I129 Hypertensive chronic kidney disease with stage 1 through stage 4 chronic kidney disease, or unspecified chronic kidney disease: Secondary | ICD-10-CM | POA: Insufficient documentation

## 2018-06-03 DIAGNOSIS — Z79899 Other long term (current) drug therapy: Secondary | ICD-10-CM | POA: Insufficient documentation

## 2018-06-03 DIAGNOSIS — E1122 Type 2 diabetes mellitus with diabetic chronic kidney disease: Secondary | ICD-10-CM | POA: Insufficient documentation

## 2018-06-03 DIAGNOSIS — Z794 Long term (current) use of insulin: Secondary | ICD-10-CM | POA: Diagnosis not present

## 2018-06-03 DIAGNOSIS — E785 Hyperlipidemia, unspecified: Secondary | ICD-10-CM | POA: Diagnosis not present

## 2018-06-03 LAB — CBC WITH DIFFERENTIAL (CANCER CENTER ONLY)
Abs Immature Granulocytes: 0.02 10*3/uL (ref 0.00–0.07)
Basophils Absolute: 0 10*3/uL (ref 0.0–0.1)
Basophils Relative: 1 %
Eosinophils Absolute: 0.1 10*3/uL (ref 0.0–0.5)
Eosinophils Relative: 2 %
HCT: 34.1 % — ABNORMAL LOW (ref 36.0–46.0)
Hemoglobin: 10.7 g/dL — ABNORMAL LOW (ref 12.0–15.0)
Immature Granulocytes: 0 %
Lymphocytes Relative: 18 %
Lymphs Abs: 1.2 10*3/uL (ref 0.7–4.0)
MCH: 29.2 pg (ref 26.0–34.0)
MCHC: 31.4 g/dL (ref 30.0–36.0)
MCV: 93.2 fL (ref 80.0–100.0)
Monocytes Absolute: 0.5 10*3/uL (ref 0.1–1.0)
Monocytes Relative: 7 %
Neutro Abs: 4.9 10*3/uL (ref 1.7–7.7)
Neutrophils Relative %: 72 %
Platelet Count: 284 10*3/uL (ref 150–400)
RBC: 3.66 MIL/uL — ABNORMAL LOW (ref 3.87–5.11)
RDW: 16.4 % — ABNORMAL HIGH (ref 11.5–15.5)
WBC Count: 6.8 10*3/uL (ref 4.0–10.5)
nRBC: 0 % (ref 0.0–0.2)

## 2018-06-03 MED ORDER — EPOETIN ALFA-EPBX 40000 UNIT/ML IJ SOLN
40000.0000 [IU] | Freq: Once | INTRAMUSCULAR | Status: AC
  Administered 2018-06-03: 40000 [IU] via SUBCUTANEOUS
  Filled 2018-06-03: qty 1

## 2018-06-03 NOTE — Patient Outreach (Signed)
[facility]) Care Management  06/03/2018   Courtney Goodman 12-Aug-1945 [id number] RN Health Coach telephone call to patient.  [employer] compliance verified. Per patient she is doing fair. Patient A1C has increased to 8.6 from 6.7. Patient stated that she has been under a lot of stress. RN Health Coach offered patient referral for counseling and patient stated she is receiving. RN discussed eating healthier and patient stated that she is having a hard time affording fresh fruit and fresh vegetables. Patient stated that she is trying to do social distancing but does not have a mask. Patient has been having episodes of hypertension but has been unable to check blood pressure at home.Her blood pressure was 120/100 at Dr office. Patient is currently on medication. Patient has agreed to follow up outreach calls.    Current Medications:  Current Outpatient Medications  Medication Sig Dispense Refill  . albuterol (PROVENTIL HFA;VENTOLIN HFA) 108 (90 Base) MCG/ACT inhaler Inhale 2 puffs into the lungs every 6 (six) hours as needed for wheezing or shortness of breath. 1 Inhaler 0  . amLODipine-benazepril (LOTREL) 5-20 MG capsule Take 1 capsule by mouth daily. 90 capsule 0  . aspirin EC 81 MG tablet Take 81 mg by mouth daily.     . Biotin 10000 MCG TABS Take 1 tablet by mouth daily.     . carvedilol (COREG) 6.25 MG tablet Take 1 tablet by mouth twice daily. 90 tablet 0  . cholecalciferol (VITAMIN D) 25 MCG (1000 UT) tablet Take 1 tablet by mouth daily. 90 tablet 0  . citalopram (CELEXA) 20 MG tablet Take 1 tablet (20 mg total) by mouth daily. 90 tablet 1  . fenofibrate (TRICOR) 145 MG tablet Take 1 tablet (145 mg total) by mouth daily. 90 tablet 1  . fluticasone (FLONASE) 50 MCG/ACT nasal spray Place 2 sprays into both nostrils daily. 48 g 0  . folic acid (FOLVITE) 1 MG tablet Take 1 tablet (1 mg total) by mouth daily. 90 tablet 0  . glucose blood (TRUE METRIX BLOOD GLUCOSE TEST) test strip  Use to monitor glucose levels twice per day 100 each 12  . Insulin Detemir (LEVEMIR FLEXTOUCH) 100 UNIT/ML Pen Inject 130 Units into the skin every morning. And pen needles 2/day 15 pen 2  . Insulin Pen Needle 31G X 5 MM MISC Use to inject insulin three times daily 100 each 1  . liraglutide (VICTOZA) 18 MG/3ML SOPN Inject 1.8 mg subcutaneously daily. 27 mL 2  . omeprazole (PRILOSEC) 40 MG capsule Take 1 capsule (40 mg total) by mouth daily. 90 capsule 0  . simvastatin (ZOCOR) 20 MG tablet Take 1 tablet by mouth every night. 90 tablet 0  . traMADol (ULTRAM) 50 MG tablet Take 1 tablet by mouth daily as needed. (Patient taking differently: Take 1 tablet by mouth daily as needed. for pain) 90 tablet 0  . traZODone (DESYREL) 50 MG tablet Take 50 mg by mouth at bedtime. Patient gets this from her therapist, not PCP     No current facility-administered medications for this visit.     Functional Status:  In your present state of health, do you have any difficulty performing the following activities: 06/03/2018 04/02/2018  Hearing? Y Y  Comment hearing aids has hearing aids  Vision? Y Y  Comment glaucoma glaucoma  Difficulty concentrating or making decisions? Y Y  Comment Sometimes can't remember patient has some depression  Walking or climbing stairs? Y Y  Dressing or bathing? N N  Doing  errands, shopping? Y Y  Comment Daughter takes patient daughter takes her  Conservation officer, nature and eating ? N N  Using the Toilet? N N  In the past six months, have you accidently leaked urine? N N  Do you have problems with loss of bowel control? N N  Managing your Medications? N N  Managing your Finances? N N  Housekeeping or managing your Housekeeping? N N  Some recent data might be hidden    Fall/Depression Screening: Fall Risk  06/03/2018 04/01/2018 02/11/2018  Falls in the past year? 0 0 0  Number falls in past yr: - - 0  Injury with Fall? - - 0  Risk Factor Category  - - -  Risk for fall due to : - - -   Follow up - - -   PHQ 2/9 Scores 06/03/2018 04/01/2018 02/16/2018 02/11/2018 09/02/2017 08/26/2017 05/07/2017  PHQ - 2 Score 0 0 0 0 4 1 0  PHQ- 9 Score - - - - 12 - -   THN CM Care Plan Problem One     Most Recent Value  Care Plan Problem One  Knowledge Deficit in self management of diabetes  Role Documenting the Problem One  [place] for Problem One  Active  THN Long Term Goal   Patient will see a decrease in A1C from 6.7 within the next blood draw  THN Long Term Goal Start Date  06/02/18  Interventions for Problem One Long Term Goal  RN discussed the increase in A1C. RN reiterated monitoring blood sugar. RN will follow up with further discussion  THN CM Short Term Goal #1   Patient will not have any hypoglycemic episodes within the next 4 weeks  THN CM Short Term Goal #1 Start Date  06/02/18  Interventions for Short Term Goal #1  RN reiterated with patient that she needs to eat meals at reagular intervals and not skip meals. RN willfollow up for further discussion  THN CM Short Term Goal #2   Patient will be able to verbalize healthy low carb snacks within the next 30 days  THN CM Short Term Goal #2 Start Date  06/02/18  Interventions for Short Term Goal #2  RN reiterated eating healthy snacks. RN will follow up for further discussion   THN CM Short Term Goal #3  Patient will be able to pick healthy diabetic foods from dining out menu within the next 30 days   THN CM Short Term Goal #3 Start Date  06/02/18  Interventions for Short Tern Goal #3  RN reiterated making better choices. RN willfollow up with further discussion      Assessment:  Patient A1C has increased from 6.7 to 8.7 Patient stated she is under stress and is receiving counseling Patient blood pressure has been elevated at Dr office 120/100 Unable to check BP at home no monitor Patient will continue to  benefit from [place] telephonic outreach for education and support for diabetes self management.  Plan:   RN sent respiratory cloth mask RN referred to Education officer, museum RN discussed eating healthy RN discussed keeping follow up appointments RN sent BP monitor RN will follow up within the month of July.  [facility] [phone]

## 2018-06-03 NOTE — Patient Outreach (Signed)
[facility]) Care Management  06/03/2018  Courtney Goodman [birth year] [id number]   CSW was able to make brief contact with patient today, prepared to provide food resources; however, patient reported that she was on her way to the [facility] to receive an injection, taking down CSW's contact information, agreeing to return CSW's call at her earliest convenience.  CSW will make a second outreach attempt within the next 6 business days (due to [facility]), on Thursday, Jun 11, 2018, around 10:00AM, if a return call is not received in the meantime.  HIPAA compliant identifiers verified with patient, which included patient's name and date of birth.  Nat Christen, BSW, MSW, LCSW  Licensed Education officer, environmental Health System  Mailing [place] N. [address] [place], [place] [address] Physical Address-[address], [place], [place] [address] Toll Free Main # [phone] Fax # [phone] Cell # [phone]  Office # [phone] [email or web address]

## 2018-06-04 ENCOUNTER — Encounter: Payer: Self-pay | Admitting: *Deleted

## 2018-06-04 ENCOUNTER — Other Ambulatory Visit: Payer: Self-pay | Admitting: *Deleted

## 2018-06-04 NOTE — Patient Outreach (Addendum)
[facility]) Care Management  06/04/2018  Courtney Goodman Jul 08, 1945 [id number]   CSW received a return call from patient today, in response to the brief conversation that CSW had with patient on Wednesday, Jun 03, 2018.  CSW was able to perform the initial phone assessment, as well as assess and assist with social work needs and services.  CSW introduced self, explained role and types of services provided through [facility] ([facility]).  CSW further explained to patient that CSW works with patient's [place], also with [facility], [name]. CSW then explained the reason for the call, indicating that Mrs. Pleasant thought that patient would benefit from social work services and resources to assist with food insecurity.  CSW obtained two HIPAA compliant identifiers from patient, which included patient's name and date of birth.  Patient reported that she is currently having a difficult time accessing her bank account, not sure why that is, but plans to speak with a representative from her bank today to try and resolve the problem.  Patient admits to having money in her bank account, just unable to access it with her debit card.  Patient believes that it may be a chip reader error, and is planning to request that a new debit card be issued.  Patient was also encouraged to make a withdrawal from her back account, while she is with a representative, if patient is in need of money at the present time.  Patient is aware that it may take a week for her to receive a new debit card in the mail.  Patient indicated that she has enough food to last her for at least a week, but also has money left on her EBT card, as she is a Physicist, medical recipient through the [facility].    CSW explained to patient that she is eligible to receive at least 20 free meals through the [place], a benefit that  she was not aware of.  CSW recited the following information to patient from the [facility] website:   "[employer]-covered patients who are eligible for the chronic condition meals program receive 20 meals that support the special dietary needs of that chronic condition. Patients with multiple conditions can receive 20 meals per condition and up to 60 meals during the plan year. Eligible conditions are congestive heart failure, coronary artery disease, diabetes and end-stage renal disease".  Patient was instructed to call # [phone] to inquire about when she will begin receiving the meals, as CSW agreed to contact a representative with the [facility] Well Dine Program to get patient established.  CSW offered to mail patient a copy of [facility] in [place], as well as [facility] in [place], but patient denied, indicating that she already has copies of both, and knows how the process works, having already utilized them in the past.  After thorough assessment was completed, no additional social work needs have been identified at this time.  CSW agreed to follow-up with patient again next week, on Thursday, Jun 11, 2018, around 10:00AM, to ensure that patient has been established with the [facility] Well Dine Program and that she has already received the 20 meals, or at least has a delivery date for when the meals will be shipped.  Patient did not wish for CSW to make a referral for her to  Mobile Meals through [employer] [facility] [place].  Patient voiced understanding and was agreeable to this plan.  Nat Christen, BSW, MSW, LCSW  Licensed Education officer, environmental Health System  Mailing [place] [address]. [address], [place], [place] [address] Physical Address-[address], [place], [place] [address] Toll Free Main # [phone] Fax # [phone] Cell # [phone]  Office #  [phone] [email or web address]

## 2018-06-10 ENCOUNTER — Ambulatory Visit: Payer: Medicare HMO

## 2018-06-10 ENCOUNTER — Other Ambulatory Visit: Payer: Medicare HMO

## 2018-06-10 ENCOUNTER — Ambulatory Visit: Payer: Medicare HMO | Admitting: Internal Medicine

## 2018-06-11 ENCOUNTER — Other Ambulatory Visit: Payer: Self-pay | Admitting: *Deleted

## 2018-06-11 ENCOUNTER — Ambulatory Visit: Payer: Medicare HMO | Admitting: *Deleted

## 2018-06-11 ENCOUNTER — Encounter: Payer: Self-pay | Admitting: *Deleted

## 2018-06-11 NOTE — Patient Outreach (Signed)
[facility]) Care Management  06/11/2018  Courtney Goodman [place] Nov 22, 1945 [id number]   CSW was able to make contact with patient today to follow-up regarding social work services and resources, as well as to ensure that patient received the box of 10 frozen meals, free of charge, through the [place].  Patient admitted that she received the meals and was most appreciative of CSW making her aware of this program, as well as getting her enrolled.  CSW reminded patient that she is entitled to 61 free frozen meals per plan year, all she needs to do is call and make the request.  Patient voiced understanding and was agreeable to this plan.  CSW will perform a case closure on patient, as all goals of treatment have been met from social work standpoint and no additional social work needs have been identified at this time.  CSW will notify patient's [place] with [facility] [place] [facility], [name] of CSW's plans to close patient's case.  CSW will fax an update to patient's Primary Care Physician, [name] to ensure that they are aware of CSW's involvement with patient's plan of care.  CSW was able to confirm that patient has the correct contact information for CSW, encouraging patient to contact CSW directly if additional social work needs arise in the near future.  Nat Christen, BSW, MSW, LCSW  Licensed Education officer, environmental Health System  Mailing [place] [address]. [address], [place], [place] [address] Physical Address-[address], [place],  [address] Toll Free Main # [phone] Fax # [phone] Cell # [phone]  Office # [phone] [email or web address]

## 2018-06-17 ENCOUNTER — Encounter: Payer: Self-pay | Admitting: Internal Medicine

## 2018-06-17 ENCOUNTER — Inpatient Hospital Stay: Payer: Medicare HMO

## 2018-06-17 ENCOUNTER — Inpatient Hospital Stay (HOSPITAL_BASED_OUTPATIENT_CLINIC_OR_DEPARTMENT_OTHER): Payer: Medicare HMO | Admitting: Internal Medicine

## 2018-06-17 ENCOUNTER — Other Ambulatory Visit: Payer: Self-pay

## 2018-06-17 VITALS — BP 126/69 | HR 70 | Temp 98.2°F | Resp 18 | Ht 64.0 in | Wt 171.3 lb

## 2018-06-17 DIAGNOSIS — J449 Chronic obstructive pulmonary disease, unspecified: Secondary | ICD-10-CM

## 2018-06-17 DIAGNOSIS — I129 Hypertensive chronic kidney disease with stage 1 through stage 4 chronic kidney disease, or unspecified chronic kidney disease: Secondary | ICD-10-CM | POA: Diagnosis not present

## 2018-06-17 DIAGNOSIS — E559 Vitamin D deficiency, unspecified: Secondary | ICD-10-CM

## 2018-06-17 DIAGNOSIS — Z79899 Other long term (current) drug therapy: Secondary | ICD-10-CM

## 2018-06-17 DIAGNOSIS — D638 Anemia in other chronic diseases classified elsewhere: Secondary | ICD-10-CM

## 2018-06-17 DIAGNOSIS — D631 Anemia in chronic kidney disease: Secondary | ICD-10-CM

## 2018-06-17 DIAGNOSIS — N189 Chronic kidney disease, unspecified: Secondary | ICD-10-CM | POA: Diagnosis not present

## 2018-06-17 DIAGNOSIS — I2510 Atherosclerotic heart disease of native coronary artery without angina pectoris: Secondary | ICD-10-CM | POA: Diagnosis not present

## 2018-06-17 DIAGNOSIS — E785 Hyperlipidemia, unspecified: Secondary | ICD-10-CM

## 2018-06-17 DIAGNOSIS — E1122 Type 2 diabetes mellitus with diabetic chronic kidney disease: Secondary | ICD-10-CM | POA: Diagnosis not present

## 2018-06-17 DIAGNOSIS — R5383 Other fatigue: Secondary | ICD-10-CM | POA: Diagnosis not present

## 2018-06-17 DIAGNOSIS — J45909 Unspecified asthma, uncomplicated: Secondary | ICD-10-CM

## 2018-06-17 DIAGNOSIS — M810 Age-related osteoporosis without current pathological fracture: Secondary | ICD-10-CM

## 2018-06-17 DIAGNOSIS — Z794 Long term (current) use of insulin: Secondary | ICD-10-CM

## 2018-06-17 DIAGNOSIS — I10 Essential (primary) hypertension: Secondary | ICD-10-CM

## 2018-06-17 LAB — CBC WITH DIFFERENTIAL (CANCER CENTER ONLY)
Abs Immature Granulocytes: 0.04 10*3/uL (ref 0.00–0.07)
Basophils Absolute: 0 10*3/uL (ref 0.0–0.1)
Basophils Relative: 1 %
Eosinophils Absolute: 0.1 10*3/uL (ref 0.0–0.5)
Eosinophils Relative: 2 %
HCT: 37.7 % (ref 36.0–46.0)
Hemoglobin: 11.8 g/dL — ABNORMAL LOW (ref 12.0–15.0)
Immature Granulocytes: 1 %
Lymphocytes Relative: 19 %
Lymphs Abs: 1.6 10*3/uL (ref 0.7–4.0)
MCH: 29.6 pg (ref 26.0–34.0)
MCHC: 31.3 g/dL (ref 30.0–36.0)
MCV: 94.5 fL (ref 80.0–100.0)
Monocytes Absolute: 0.6 10*3/uL (ref 0.1–1.0)
Monocytes Relative: 7 %
Neutro Abs: 5.7 10*3/uL (ref 1.7–7.7)
Neutrophils Relative %: 70 %
Platelet Count: 313 10*3/uL (ref 150–400)
RBC: 3.99 MIL/uL (ref 3.87–5.11)
RDW: 16.6 % — ABNORMAL HIGH (ref 11.5–15.5)
WBC Count: 8 10*3/uL (ref 4.0–10.5)
nRBC: 0 % (ref 0.0–0.2)

## 2018-06-17 NOTE — Progress Notes (Signed)
[place] Telephone:([phone]   Fax:([phone]  OFFICE PROGRESS NOTE  [name], NP-C [place] [id number]  DIAGNOSIS: Anemia of chronic disease secondary to chronic renal insufficiency  PRIOR THERAPY: None  CURRENT THERAPY: Aranesp 300 mcg subcutaneously every 3 weeks in addition to over-the-counter oral iron tablet 1 tablet every day..  INTERVAL HISTORY: Courtney Goodman 73 y.o. female returns to the clinic today for follow-up visit.  The patient is feeling fine today with no concerning complaints except for mild fatigue.  She denied having any recent chest pain, shortness of breath, cough or hemoptysis.  She denied having any fever or chills.  She has no nausea, vomiting, diarrhea or constipation.  She has no headache or visual changes.  She has been tolerating her treatment with Aranesp and oral iron tablets fairly well.  She is here today for evaluation and repeat blood work.  MEDICAL HISTORY: Past Medical History:  Diagnosis Date  . Anemia of chronic disease   . Asthma   . Atherosclerotic heart disease of native coronary artery without angina pectoris   . CKD (chronic kidney disease)   . COPD (chronic obstructive pulmonary disease) ([place])   . DM (diabetes mellitus), type 2 with renal complications ([place])   . Dysrhythmia   . Elevated ferritin 01/03/2017  . GAD (generalized anxiety disorder)   . Hypertension   . Mixed hyperlipidemia due to type 2 diabetes mellitus ([name])   . Mixed incontinence    per medical records from [place]  . Osteopenia   . Personal history of noncompliance with medical treatment, presenting hazards to health   . Shortness of breath dyspnea   . Suicidal ideation   . TB (tuberculosis), treated    age 73  . Vitamin D deficiency     ALLERGIES:  has No Known Allergies.  MEDICATIONS:  Current Outpatient Medications  Medication Sig Dispense Refill  . albuterol (PROVENTIL HFA;VENTOLIN HFA) 108 (90  Base) MCG/ACT inhaler Inhale 2 puffs into the lungs every 6 (six) hours as needed for wheezing or shortness of breath. 1 Inhaler 0  . amLODipine-benazepril (LOTREL) 5-20 MG capsule Take 1 capsule by mouth daily. 90 capsule 0  . aspirin EC 81 MG tablet Take 81 mg by mouth daily.     . Biotin 10000 MCG TABS Take 1 tablet by mouth daily.     . carvedilol (COREG) 6.25 MG tablet Take 1 tablet by mouth twice daily. 90 tablet 0  . cholecalciferol (VITAMIN D) 25 MCG (1000 UT) tablet Take 1 tablet by mouth daily. 90 tablet 0  . citalopram (CELEXA) 20 MG tablet Take 1 tablet (20 mg total) by mouth daily. 90 tablet 1  . fenofibrate (TRICOR) 145 MG tablet Take 1 tablet (145 mg total) by mouth daily. 90 tablet 1  . fluticasone (FLONASE) 50 MCG/ACT nasal spray Place 2 sprays into both nostrils daily. 48 g 0  . folic acid (FOLVITE) 1 MG tablet Take 1 tablet (1 mg total) by mouth daily. 90 tablet 0  . glucose blood (TRUE METRIX BLOOD GLUCOSE TEST) test strip Use to monitor glucose levels twice per day 100 each 12  . Insulin Detemir (LEVEMIR FLEXTOUCH) 100 UNIT/ML Pen Inject 130 Units into the skin every morning. And pen needles 2/day 15 pen 2  . Insulin Pen Needle 31G X 5 MM MISC Use to inject insulin three times daily 100 each 1  . liraglutide (VICTOZA) 18 MG/3ML SOPN Inject 1.8  mg subcutaneously daily. 27 mL 2  . omeprazole (PRILOSEC) 40 MG capsule Take 1 capsule (40 mg total) by mouth daily. 90 capsule 0  . simvastatin (ZOCOR) 20 MG tablet Take 1 tablet by mouth every night. 90 tablet 0  . traMADol (ULTRAM) 50 MG tablet Take 1 tablet by mouth daily as needed. (Patient taking differently: Take 1 tablet by mouth daily as needed. for pain) 90 tablet 0  . traZODone (DESYREL) 50 MG tablet Take 50 mg by mouth at bedtime. Patient gets this from her therapist, not PCP     No current facility-administered medications for this visit.     SURGICAL HISTORY:  Past Surgical History:  Procedure Laterality Date  . ANKLE  RECONSTRUCTION     right  . CARPAL TUNNEL RELEASE    . CORONARY ANGIOPLASTY      REVIEW OF SYSTEMS:  A comprehensive review of systems was negative except for: Constitutional: positive for fatigue   PHYSICAL EXAMINATION: General appearance: alert, cooperative and no distress Head: Normocephalic, without obvious abnormality, atraumatic Neck: no adenopathy, no JVD, supple, symmetrical, trachea midline and thyroid not enlarged, symmetric, no tenderness/mass/nodules Lymph nodes: Cervical, supraclavicular, and axillary nodes normal. Resp: clear to auscultation bilaterally Back: symmetric, no curvature. ROM normal. No CVA tenderness. Cardio: regular rate and rhythm, S1, S2 normal, no murmur, click, rub or gallop GI: soft, non-tender; bowel sounds normal; no masses,  no organomegaly Extremities: extremities normal, atraumatic, no cyanosis or edema  ECOG PERFORMANCE STATUS: 1 - Symptomatic but completely ambulatory  Blood pressure 126/69, pulse 70, temperature 98.2 F (36.8 C), temperature source Oral, resp. rate 18, height 5\' 4"  (1.626 m), weight 171 lb 4.8 oz (77.7 kg), SpO2 100 %.  LABORATORY DATA: Lab Results  Component Value Date   WBC 8.0 06/17/2018   HGB 11.8 (L) 06/17/2018   HCT 37.7 06/17/2018   MCV 94.5 06/17/2018   PLT 313 06/17/2018      Chemistry      Component Value Date/Time   NA 139 04/08/2018 1154   NA 146 (H) 05/07/2017 1147   NA 137 05/29/2011 1946   K 4.0 04/08/2018 1154   K 3.9 05/29/2011 1946   CL 103 04/08/2018 1154   CL 100 05/29/2011 1946   CO2 27 04/08/2018 1154   CO2 28 05/29/2011 1946   BUN 32 (H) 04/08/2018 1154   BUN 28 (H) 05/07/2017 1147   BUN 24 (H) 05/29/2011 1946   CREATININE 1.52 (H) 04/08/2018 1154   CREATININE 1.56 (H) 01/01/2017 0941      Component Value Date/Time   CALCIUM 9.5 04/08/2018 1154   CALCIUM 9.8 05/29/2011 1946   ALKPHOS 86 04/08/2018 1154   ALKPHOS 86 05/29/2011 1946   AST 14 (L) 04/08/2018 1154   ALT 13 04/08/2018  1154   ALT 15 05/29/2011 1946   BILITOT 0.4 04/08/2018 1154       RADIOGRAPHIC STUDIES: No results found.  ASSESSMENT AND PLAN: This is a very pleasant 73 years old African-American female with anemia of chronic disease secondary to chronic renal insufficiency.  The patient is currently undergoing treatment with Aranesp injections 300 mcg subcutaneously every 2 weeks. The patient has been tolerating this treatment well with no concerning adverse effects. CBC today showed mild anemia. I recommended for her to continue her current treatment with Aranesp with the same dose. She was also advised to keep taking the oral iron tablets at regular basis. She will come back for follow-up visit in 3 months for evaluation  with repeat CBC, iron study and ferritin. The patient was advised to call immediately if she has any concerning symptoms in the interval. The patient voices understanding of current disease status and treatment options and is in agreement with the current care plan. All questions were answered. The patient knows to call the clinic with any problems, questions or concerns. We can certainly see the patient much sooner if necessary.  I spent 10 minutes counseling the patient face to face. The total time spent in the appointment was 15 minutes.  Disclaimer: This note was dictated with voice recognition software. Similar sounding words can inadvertently be transcribed and may not be corrected upon review.

## 2018-06-19 ENCOUNTER — Telehealth: Payer: Self-pay | Admitting: Internal Medicine

## 2018-06-19 NOTE — Telephone Encounter (Signed)
Scheduled appt per 5/20 los - unable to reach patient . Left message with appt date and time

## 2018-06-30 ENCOUNTER — Encounter: Payer: Self-pay | Admitting: Podiatry

## 2018-06-30 ENCOUNTER — Other Ambulatory Visit: Payer: Self-pay

## 2018-06-30 ENCOUNTER — Ambulatory Visit (INDEPENDENT_AMBULATORY_CARE_PROVIDER_SITE_OTHER): Payer: Medicare HMO | Admitting: Podiatry

## 2018-06-30 VITALS — Temp 98.1°F

## 2018-06-30 DIAGNOSIS — N183 Chronic kidney disease, stage 3 unspecified: Secondary | ICD-10-CM

## 2018-06-30 DIAGNOSIS — B351 Tinea unguium: Secondary | ICD-10-CM | POA: Diagnosis not present

## 2018-06-30 DIAGNOSIS — Q800 Ichthyosis vulgaris: Secondary | ICD-10-CM

## 2018-06-30 DIAGNOSIS — M79674 Pain in right toe(s): Secondary | ICD-10-CM | POA: Diagnosis not present

## 2018-06-30 DIAGNOSIS — E1122 Type 2 diabetes mellitus with diabetic chronic kidney disease: Secondary | ICD-10-CM

## 2018-06-30 DIAGNOSIS — Z794 Long term (current) use of insulin: Secondary | ICD-10-CM

## 2018-06-30 DIAGNOSIS — M79675 Pain in left toe(s): Secondary | ICD-10-CM

## 2018-06-30 MED ORDER — TRIAMCINOLONE ACETONIDE 0.025 % EX OINT: 1.0000 "application " | TOPICAL_OINTMENT | Freq: Two times a day (BID) | CUTANEOUS | 1 refills | Status: DC

## 2018-06-30 NOTE — Patient Instructions (Signed)
Diabetes Mellitus and Foot Care Foot care is an important part of your health, especially when you have diabetes. Diabetes may cause you to have problems because of poor blood flow (circulation) to your feet and legs, which can cause your skin to:  Become thinner and drier.  Break more easily.  Heal more slowly.  Peel and crack. You may also have nerve damage (neuropathy) in your legs and feet, causing decreased feeling in them. This means that you may not notice minor injuries to your feet that could lead to more serious problems. Noticing and addressing any potential problems early is the best way to prevent future foot problems. How to care for your feet Foot hygiene  Wash your feet daily with warm water and mild soap. Do not use hot water. Then, pat your feet and the areas between your toes until they are completely dry. Do not soak your feet as this can dry your skin.  Trim your toenails straight across. Do not dig under them or around the cuticle. File the edges of your nails with an emery board or nail file.  Apply a moisturizing lotion or petroleum jelly to the skin on your feet and to dry, brittle toenails. Use lotion that does not contain alcohol and is unscented. Do not apply lotion between your toes. Shoes and socks  Wear clean socks or stockings every day. Make sure they are not too tight. Do not wear knee-high stockings since they may decrease blood flow to your legs.  Wear shoes that fit properly and have enough cushioning. Always look in your shoes before you put them on to be sure there are no objects inside.  To break in new shoes, wear them for just a few hours a day. This prevents injuries on your feet. Wounds, scrapes, corns, and calluses  Check your feet daily for blisters, cuts, bruises, sores, and redness. If you cannot see the bottom of your feet, use a mirror or ask someone for help.  Do not cut corns or calluses or try to remove them with medicine.  If you  find a minor scrape, cut, or break in the skin on your feet, keep it and the skin around it clean and dry. You may clean these areas with mild soap and water. Do not clean the area with peroxide, alcohol, or iodine.  If you have a wound, scrape, corn, or callus on your foot, look at it several times a day to make sure it is healing and not infected. Check for: ? Redness, swelling, or pain. ? Fluid or blood. ? Warmth. ? Pus or a bad smell. General instructions  Do not cross your legs. This may decrease blood flow to your feet.  Do not use heating pads or hot water bottles on your feet. They may burn your skin. If you have lost feeling in your feet or legs, you may not know this is happening until it is too late.  Protect your feet from hot and cold by wearing shoes, such as at the beach or on hot pavement.  Schedule a complete foot exam at least once a year (annually) or more often if you have foot problems. If you have foot problems, report any cuts, sores, or bruises to your health care provider immediately. Contact a health care provider if:  You have a medical condition that increases your risk of infection and you have any cuts, sores, or bruises on your feet.  You have an injury that is not   healing.  You have redness on your legs or feet.  You feel burning or tingling in your legs or feet.  You have pain or cramps in your legs and feet.  Your legs or feet are numb.  Your feet always feel cold.  You have pain around a toenail. Get help right away if:  You have a wound, scrape, corn, or callus on your foot and: ? You have pain, swelling, or redness that gets worse. ? You have fluid or blood coming from the wound, scrape, corn, or callus. ? Your wound, scrape, corn, or callus feels warm to the touch. ? You have pus or a bad smell coming from the wound, scrape, corn, or callus. ? You have a fever. ? You have a red line going up your leg. Summary  Check your feet every day  for cuts, sores, red spots, swelling, and blisters.  Moisturize feet and legs daily.  Wear shoes that fit properly and have enough cushioning.  If you have foot problems, report any cuts, sores, or bruises to your health care provider immediately.  Schedule a complete foot exam at least once a year (annually) or more often if you have foot problems. This information is not intended to replace advice given to you by your health care provider. Make sure you discuss any questions you have with your health care provider. Document Released: 01/12/2000 Document Revised: 02/26/2017 Document Reviewed: 02/16/2016 Elsevier Interactive Patient Education  2019 Elsevier Inc.  Onychomycosis/Fungal Toenails  WHAT IS IT? An infection that lies within the keratin of your nail plate that is caused by a fungus.  WHY ME? Fungal infections affect all ages, sexes, races, and creeds.  There may be many factors that predispose you to a fungal infection such as age, coexisting medical conditions such as diabetes, or an autoimmune disease; stress, medications, fatigue, genetics, etc.  Bottom line: fungus thrives in a warm, moist environment and your shoes offer such a location.  IS IT CONTAGIOUS? Theoretically, yes.  You do not want to share shoes, nail clippers or files with someone who has fungal toenails.  Walking around barefoot in the same room or sleeping in the same bed is unlikely to transfer the organism.  It is important to realize, however, that fungus can spread easily from one nail to the next on the same foot.  HOW DO WE TREAT THIS?  There are several ways to treat this condition.  Treatment may depend on many factors such as age, medications, pregnancy, liver and kidney conditions, etc.  It is best to ask your doctor which options are available to you.  1. No treatment.   Unlike many other medical concerns, you can live with this condition.  However for many people this can be a painful condition and  may lead to ingrown toenails or a bacterial infection.  It is recommended that you keep the nails cut short to help reduce the amount of fungal nail. 2. Topical treatment.  These range from herbal remedies to prescription strength nail lacquers.  About 40-50% effective, topicals require twice daily application for approximately 9 to 12 months or until an entirely new nail has grown out.  The most effective topicals are medical grade medications available through physicians offices. 3. Oral antifungal medications.  With an 80-90% cure rate, the most common oral medication requires 3 to 4 months of therapy and stays in your system for a year as the new nail grows out.  Oral antifungal medications do require   blood work to make sure it is a safe drug for you.  A liver function panel will be performed prior to starting the medication and after the first month of treatment.  It is important to have the blood work performed to avoid any harmful side effects.  In general, this medication safe but blood work is required. 4. Laser Therapy.  This treatment is performed by applying a specialized laser to the affected nail plate.  This therapy is noninvasive, fast, and non-painful.  It is not covered by insurance and is therefore, out of pocket.  The results have been very good with a 80-95% cure rate.  The [facility] is the only practice in the area to offer this therapy. 5. Permanent Nail Avulsion.  Removing the entire nail so that a new nail will not grow back. 

## 2018-07-01 ENCOUNTER — Inpatient Hospital Stay: Payer: Medicare HMO | Attending: Oncology

## 2018-07-01 ENCOUNTER — Inpatient Hospital Stay: Payer: Medicare HMO

## 2018-07-01 DIAGNOSIS — Z79899 Other long term (current) drug therapy: Secondary | ICD-10-CM | POA: Insufficient documentation

## 2018-07-01 DIAGNOSIS — I129 Hypertensive chronic kidney disease with stage 1 through stage 4 chronic kidney disease, or unspecified chronic kidney disease: Secondary | ICD-10-CM | POA: Insufficient documentation

## 2018-07-01 DIAGNOSIS — N189 Chronic kidney disease, unspecified: Secondary | ICD-10-CM | POA: Insufficient documentation

## 2018-07-01 DIAGNOSIS — D631 Anemia in chronic kidney disease: Secondary | ICD-10-CM | POA: Insufficient documentation

## 2018-07-04 ENCOUNTER — Encounter: Payer: Self-pay | Admitting: Podiatry

## 2018-07-04 NOTE — Progress Notes (Signed)
Subjective: Courtney Goodman presents today for preventative diabetic foot care with painful, thick toenails 1-5 b/l that she cannot cut and which interfere with daily activities.  Pain is aggravated when wearing enclosed shoe gear.  She is concerned about the skin texture of her lower extremities stating there is scaling which has been there for quite a while. She denies any redness, drainage or open wounds from her extremities.  Henson, Vickie L, NP-C is her PCP. Last visit was 06/17/2018.  No Known Allergies  Objective: Vitals:   06/30/18 0944  Temp: 98.1 F (36.7 C)   Vascular Examination: Capillary refill time <3 seconds  x 10 digits.  Dorsalis pedis and Posterior tibial pulses palpable b/l.  Digital hair absent x 10 digits.  Skin temperature gradient WNL b/l.  Dermatological Examination: Skin with normal turgor, texture and tone b/l  Toenails 1-5 b/l discolored, thick, dystrophic with subungual debris and pain with palpation to nailbeds due to thickness of nails.  Lamellar brown scales noted symmetrically b/l lower extremities extending distally to both feet. No erythema, no blisters, no open wounds, no edema, no drainage.  Musculoskeletal: Muscle strength 5/5 to all LE muscle groups.  No pain, crepitus or joint limitation noted with ROM.   Neurological: Sensation intact with 10 gram monofilament.  Vibratory sensation intact.  Assessment: Painful onychomycosis toenails 1-5 b/l  Ichthyosis vulgaris b/l LE  Plan: 1. Toenails 1-5 b/l were debrided in length and girth without iatrogenic bleeding. 2. For ichthyosis vulgaris, Rx written for Triamcinolone Ointment 0.025% to be applied to lower extremities twice daily. 3. Patient to continue soft, supportive shoe gear daily. 4. Patient to report any pedal injuries to medical professional immediately. 5. Follow up 3 months.  6. Patient/POA to call should there be a concern in the interim.

## 2018-07-15 ENCOUNTER — Inpatient Hospital Stay: Payer: Medicare HMO

## 2018-07-15 ENCOUNTER — Other Ambulatory Visit: Payer: Self-pay | Admitting: Family Medicine

## 2018-07-15 ENCOUNTER — Other Ambulatory Visit: Payer: Self-pay

## 2018-07-15 VITALS — BP 126/68 | HR 72 | Temp 98.2°F | Resp 18

## 2018-07-15 DIAGNOSIS — D631 Anemia in chronic kidney disease: Secondary | ICD-10-CM | POA: Diagnosis not present

## 2018-07-15 DIAGNOSIS — N189 Chronic kidney disease, unspecified: Secondary | ICD-10-CM | POA: Diagnosis not present

## 2018-07-15 DIAGNOSIS — Z1231 Encounter for screening mammogram for malignant neoplasm of breast: Secondary | ICD-10-CM

## 2018-07-15 DIAGNOSIS — Z79899 Other long term (current) drug therapy: Secondary | ICD-10-CM | POA: Diagnosis not present

## 2018-07-15 DIAGNOSIS — D638 Anemia in other chronic diseases classified elsewhere: Secondary | ICD-10-CM

## 2018-07-15 DIAGNOSIS — N183 Chronic kidney disease, stage 3 unspecified: Secondary | ICD-10-CM

## 2018-07-15 DIAGNOSIS — I129 Hypertensive chronic kidney disease with stage 1 through stage 4 chronic kidney disease, or unspecified chronic kidney disease: Secondary | ICD-10-CM | POA: Diagnosis not present

## 2018-07-15 LAB — CBC WITH DIFFERENTIAL (CANCER CENTER ONLY)
Abs Immature Granulocytes: 0.03 10*3/uL (ref 0.00–0.07)
Basophils Absolute: 0 10*3/uL (ref 0.0–0.1)
Basophils Relative: 0 %
Eosinophils Absolute: 0.1 10*3/uL (ref 0.0–0.5)
Eosinophils Relative: 1 %
HCT: 33.8 % — ABNORMAL LOW (ref 36.0–46.0)
Hemoglobin: 10.8 g/dL — ABNORMAL LOW (ref 12.0–15.0)
Immature Granulocytes: 0 %
Lymphocytes Relative: 16 %
Lymphs Abs: 1.3 10*3/uL (ref 0.7–4.0)
MCH: 29.3 pg (ref 26.0–34.0)
MCHC: 32 g/dL (ref 30.0–36.0)
MCV: 91.6 fL (ref 80.0–100.0)
Monocytes Absolute: 0.5 10*3/uL (ref 0.1–1.0)
Monocytes Relative: 6 %
Neutro Abs: 6.4 10*3/uL (ref 1.7–7.7)
Neutrophils Relative %: 77 %
Platelet Count: 291 10*3/uL (ref 150–400)
RBC: 3.69 MIL/uL — ABNORMAL LOW (ref 3.87–5.11)
RDW: 15.4 % (ref 11.5–15.5)
WBC Count: 8.3 10*3/uL (ref 4.0–10.5)
nRBC: 0 % (ref 0.0–0.2)

## 2018-07-15 MED ORDER — EPOETIN ALFA-EPBX 40000 UNIT/ML IJ SOLN
40000.0000 [IU] | Freq: Once | INTRAMUSCULAR | Status: AC
  Administered 2018-07-15: 40000 [IU] via SUBCUTANEOUS
  Filled 2018-07-15: qty 1

## 2018-07-15 NOTE — Patient Instructions (Signed)
Epoetin Alfa injection °What is this medicine? °EPOETIN ALFA (e POE e tin AL fa) helps your body make more red blood cells. This medicine is used to treat anemia caused by chronic kidney disease, cancer chemotherapy, or HIV-therapy. It may also be used before surgery if you have anemia. °This medicine may be used for other purposes; ask your health care provider or pharmacist if you have questions. °COMMON BRAND NAME(S): Epogen, Procrit, Retacrit °What should I tell my health care provider before I take this medicine? °They need to know if you have any of these conditions: °-cancer °-heart disease °-high blood pressure °-history of blood clots °-history of stroke °-low levels of folate, iron, or vitamin B12 in the blood °-seizures °-an unusual or allergic reaction to erythropoietin, albumin, benzyl alcohol, hamster proteins, other medicines, foods, dyes, or preservatives °-pregnant or trying to get pregnant °-breast-feeding °How should I use this medicine? °This medicine is for injection into a vein or under the skin. It is usually given by a health care professional in a hospital or clinic setting. °If you get this medicine at home, you will be taught how to prepare and give this medicine. Use exactly as directed. Take your medicine at regular intervals. Do not take your medicine more often than directed. °It is important that you put your used needles and syringes in a special sharps container. Do not put them in a trash can. If you do not have a sharps container, call your pharmacist or healthcare provider to get one. °A special MedGuide will be given to you by the pharmacist with each prescription and refill. Be sure to read this information carefully each time. °Talk to your pediatrician regarding the use of this medicine in children. While this drug may be prescribed for selected conditions, precautions do apply. °Overdosage: If you think you have taken too much of this medicine contact a poison control center  or emergency room at once. °NOTE: This medicine is only for you. Do not share this medicine with others. °What if I miss a dose? °If you miss a dose, take it as soon as you can. If it is almost time for your next dose, take only that dose. Do not take double or extra doses. °What may interact with this medicine? °Interactions have not been studied. °This list may not describe all possible interactions. Give your health care provider a list of all the medicines, herbs, non-prescription drugs, or dietary supplements you use. Also tell them if you smoke, drink alcohol, or use illegal drugs. Some items may interact with your medicine. °What should I watch for while using this medicine? °Your condition will be monitored carefully while you are receiving this medicine. °You may need blood work done while you are taking this medicine. °This medicine may cause a decrease in vitamin B6. You should make sure that you get enough vitamin B6 while you are taking this medicine. Discuss the foods you eat and the vitamins you take with your health care professional. °What side effects may I notice from receiving this medicine? °Side effects that you should report to your doctor or health care professional as soon as possible: °-allergic reactions like skin rash, itching or hives, swelling of the face, lips, or tongue °-seizures °-signs and symptoms of a blood clot such as breathing problems; changes in vision; chest pain; severe, sudden headache; pain, swelling, warmth in the leg; trouble speaking; sudden numbness or weakness of the face, arm or leg °-signs and symptoms of a stroke   like changes in vision; confusion; trouble speaking or understanding; severe headaches; sudden numbness or weakness of the face, arm or leg; trouble walking; dizziness; loss of balance or coordination °Side effects that usually do not require medical attention (report to your doctor or health care professional if they continue or are  bothersome): °-chills °-cough °-dizziness °-fever °-headaches °-joint pain °-muscle cramps °-muscle pain °-nausea, vomiting °-pain, redness, or irritation at site where injected °This list may not describe all possible side effects. Call your doctor for medical advice about side effects. You may report side effects to FDA at 1-800-FDA-1088. °Where should I keep my medicine? °Keep out of the reach of children. °Store in a refrigerator between 2 and 8 degrees C (36 and 46 degrees F). Do not freeze or shake. Throw away any unused portion if using a single-dose vial. Multi-dose vials can be kept in the refrigerator for up to 21 days after the initial dose. Throw away unused medicine. °NOTE: This sheet is a summary. It may not cover all possible information. If you have questions about this medicine, talk to your doctor, pharmacist, or health care provider. °© 2019 Elsevier/Gold Standard (2016-08-23 08:35:19) ° °

## 2018-07-29 ENCOUNTER — Inpatient Hospital Stay: Payer: Medicare HMO

## 2018-07-29 ENCOUNTER — Inpatient Hospital Stay: Payer: Medicare HMO | Attending: Oncology

## 2018-07-29 DIAGNOSIS — N189 Chronic kidney disease, unspecified: Secondary | ICD-10-CM | POA: Insufficient documentation

## 2018-07-29 DIAGNOSIS — D631 Anemia in chronic kidney disease: Secondary | ICD-10-CM | POA: Insufficient documentation

## 2018-07-29 DIAGNOSIS — Z79899 Other long term (current) drug therapy: Secondary | ICD-10-CM | POA: Insufficient documentation

## 2018-07-29 DIAGNOSIS — I129 Hypertensive chronic kidney disease with stage 1 through stage 4 chronic kidney disease, or unspecified chronic kidney disease: Secondary | ICD-10-CM | POA: Insufficient documentation

## 2018-08-07 ENCOUNTER — Other Ambulatory Visit: Payer: Self-pay | Admitting: Family Medicine

## 2018-08-07 ENCOUNTER — Telehealth: Payer: Self-pay | Admitting: Family Medicine

## 2018-08-07 MED ORDER — ALCOHOL PREP PADS: MEDICATED_PAD | 1 refills | Status: AC

## 2018-08-07 NOTE — Telephone Encounter (Signed)
Sent in pads

## 2018-08-07 NOTE — Telephone Encounter (Signed)
Ok

## 2018-08-07 NOTE — Telephone Encounter (Signed)
Pharmacy left message and said they needed a new prescription for a 90 supply of alcohol prep pads.

## 2018-08-12 ENCOUNTER — Telehealth: Payer: Self-pay | Admitting: Internal Medicine

## 2018-08-12 ENCOUNTER — Inpatient Hospital Stay: Payer: Medicare HMO

## 2018-08-12 NOTE — Telephone Encounter (Signed)
Called pt per 7/15 sch message - no answe r- left message for patient to call back to reschedule appt.

## 2018-08-20 ENCOUNTER — Inpatient Hospital Stay: Payer: Medicare HMO

## 2018-08-20 ENCOUNTER — Other Ambulatory Visit: Payer: Self-pay

## 2018-08-20 VITALS — BP 132/74 | HR 75 | Temp 98.5°F | Resp 18

## 2018-08-20 DIAGNOSIS — N189 Chronic kidney disease, unspecified: Secondary | ICD-10-CM | POA: Diagnosis not present

## 2018-08-20 DIAGNOSIS — D631 Anemia in chronic kidney disease: Secondary | ICD-10-CM | POA: Diagnosis not present

## 2018-08-20 DIAGNOSIS — Z79899 Other long term (current) drug therapy: Secondary | ICD-10-CM | POA: Diagnosis not present

## 2018-08-20 DIAGNOSIS — D638 Anemia in other chronic diseases classified elsewhere: Secondary | ICD-10-CM

## 2018-08-20 DIAGNOSIS — I129 Hypertensive chronic kidney disease with stage 1 through stage 4 chronic kidney disease, or unspecified chronic kidney disease: Secondary | ICD-10-CM | POA: Diagnosis not present

## 2018-08-20 DIAGNOSIS — N183 Chronic kidney disease, stage 3 unspecified: Secondary | ICD-10-CM

## 2018-08-20 LAB — CBC WITH DIFFERENTIAL (CANCER CENTER ONLY)
Abs Immature Granulocytes: 0.04 10*3/uL (ref 0.00–0.07)
Basophils Absolute: 0.1 10*3/uL (ref 0.0–0.1)
Basophils Relative: 1 %
Eosinophils Absolute: 0.2 10*3/uL (ref 0.0–0.5)
Eosinophils Relative: 3 %
HCT: 31.8 % — ABNORMAL LOW (ref 36.0–46.0)
Hemoglobin: 10.1 g/dL — ABNORMAL LOW (ref 12.0–15.0)
Immature Granulocytes: 0 %
Lymphocytes Relative: 21 %
Lymphs Abs: 1.9 10*3/uL (ref 0.7–4.0)
MCH: 29.1 pg (ref 26.0–34.0)
MCHC: 31.8 g/dL (ref 30.0–36.0)
MCV: 91.6 fL (ref 80.0–100.0)
Monocytes Absolute: 0.6 10*3/uL (ref 0.1–1.0)
Monocytes Relative: 7 %
Neutro Abs: 6.2 10*3/uL (ref 1.7–7.7)
Neutrophils Relative %: 68 %
Platelet Count: 292 10*3/uL (ref 150–400)
RBC: 3.47 MIL/uL — ABNORMAL LOW (ref 3.87–5.11)
RDW: 15.6 % — ABNORMAL HIGH (ref 11.5–15.5)
WBC Count: 9 10*3/uL (ref 4.0–10.5)
nRBC: 0 % (ref 0.0–0.2)

## 2018-08-20 MED ORDER — EPOETIN ALFA-EPBX 40000 UNIT/ML IJ SOLN
40000.0000 [IU] | Freq: Once | INTRAMUSCULAR | Status: AC
  Administered 2018-08-20: 10:00:00 40000 [IU] via SUBCUTANEOUS
  Filled 2018-08-20: qty 1

## 2018-08-20 NOTE — Patient Instructions (Signed)

## 2018-08-24 ENCOUNTER — Other Ambulatory Visit: Payer: Self-pay | Admitting: Family Medicine

## 2018-08-25 ENCOUNTER — Telehealth: Payer: Self-pay

## 2018-08-25 ENCOUNTER — Emergency Department (HOSPITAL_COMMUNITY)
Admission: EM | Admit: 2018-08-25 | Discharge: 2018-08-25 | Disposition: A | Discharge status: Home / Self Care | Payer: Medicare HMO | Attending: Emergency Medicine | Admitting: Emergency Medicine

## 2018-08-25 ENCOUNTER — Other Ambulatory Visit: Payer: Self-pay

## 2018-08-25 ENCOUNTER — Emergency Department (HOSPITAL_COMMUNITY): Payer: Medicare HMO

## 2018-08-25 DIAGNOSIS — J449 Chronic obstructive pulmonary disease, unspecified: Secondary | ICD-10-CM | POA: Insufficient documentation

## 2018-08-25 DIAGNOSIS — R0789 Other chest pain: Secondary | ICD-10-CM | POA: Diagnosis not present

## 2018-08-25 DIAGNOSIS — N183 Chronic kidney disease, stage 3 (moderate): Secondary | ICD-10-CM | POA: Diagnosis not present

## 2018-08-25 DIAGNOSIS — Z87891 Personal history of nicotine dependence: Secondary | ICD-10-CM | POA: Insufficient documentation

## 2018-08-25 DIAGNOSIS — Z79899 Other long term (current) drug therapy: Secondary | ICD-10-CM | POA: Diagnosis not present

## 2018-08-25 DIAGNOSIS — E1165 Type 2 diabetes mellitus with hyperglycemia: Secondary | ICD-10-CM | POA: Diagnosis not present

## 2018-08-25 DIAGNOSIS — I129 Hypertensive chronic kidney disease with stage 1 through stage 4 chronic kidney disease, or unspecified chronic kidney disease: Secondary | ICD-10-CM | POA: Insufficient documentation

## 2018-08-25 DIAGNOSIS — E1122 Type 2 diabetes mellitus with diabetic chronic kidney disease: Secondary | ICD-10-CM | POA: Diagnosis not present

## 2018-08-25 DIAGNOSIS — Z7982 Long term (current) use of aspirin: Secondary | ICD-10-CM | POA: Diagnosis not present

## 2018-08-25 DIAGNOSIS — R079 Chest pain, unspecified: Secondary | ICD-10-CM | POA: Diagnosis not present

## 2018-08-25 DIAGNOSIS — R0602 Shortness of breath: Secondary | ICD-10-CM | POA: Diagnosis not present

## 2018-08-25 DIAGNOSIS — R7989 Other specified abnormal findings of blood chemistry: Secondary | ICD-10-CM | POA: Diagnosis not present

## 2018-08-25 DIAGNOSIS — J45909 Unspecified asthma, uncomplicated: Secondary | ICD-10-CM | POA: Diagnosis not present

## 2018-08-25 DIAGNOSIS — I10 Essential (primary) hypertension: Secondary | ICD-10-CM | POA: Diagnosis not present

## 2018-08-25 DIAGNOSIS — Z794 Long term (current) use of insulin: Secondary | ICD-10-CM | POA: Insufficient documentation

## 2018-08-25 LAB — CBC
HCT: 37.7 % (ref 36.0–46.0)
Hemoglobin: 12 g/dL (ref 12.0–15.0)
MCH: 29.9 pg (ref 26.0–34.0)
MCHC: 31.8 g/dL (ref 30.0–36.0)
MCV: 93.8 fL (ref 80.0–100.0)
Platelets: 401 10*3/uL — ABNORMAL HIGH (ref 150–400)
RBC: 4.02 MIL/uL (ref 3.87–5.11)
RDW: 16.2 % — ABNORMAL HIGH (ref 11.5–15.5)
WBC: 11.7 10*3/uL — ABNORMAL HIGH (ref 4.0–10.5)
nRBC: 0 % (ref 0.0–0.2)

## 2018-08-25 LAB — BASIC METABOLIC PANEL
Anion gap: 10 (ref 5–15)
BUN: 24 mg/dL — ABNORMAL HIGH (ref 8–23)
CO2: 28 mmol/L (ref 22–32)
Calcium: 10.1 mg/dL (ref 8.9–10.3)
Chloride: 97 mmol/L — ABNORMAL LOW (ref 98–111)
Creatinine, Ser: 1.63 mg/dL — ABNORMAL HIGH (ref 0.44–1.00)
GFR calc Af Amer: 36 mL/min — ABNORMAL LOW (ref >60)
GFR calc non Af Amer: 31 mL/min — ABNORMAL LOW (ref >60)
Glucose, Bld: 447 mg/dL — ABNORMAL HIGH (ref 70–99)
Potassium: 3.5 mmol/L (ref 3.5–5.1)
Sodium: 135 mmol/L (ref 135–145)

## 2018-08-25 LAB — TROPONIN I (HIGH SENSITIVITY)
Troponin I (High Sensitivity): 6 ng/L (ref <18)
Troponin I (High Sensitivity): 7 ng/L (ref <18)

## 2018-08-25 LAB — URINALYSIS, ROUTINE W REFLEX MICROSCOPIC
Bilirubin Urine: NEGATIVE
Glucose, UA: >=500 mg/dL — AB
Ketones, ur: NEGATIVE mg/dL
Nitrite: NEGATIVE
Protein, ur: NEGATIVE mg/dL
Specific Gravity, Urine: 1.013 (ref 1.005–1.030)
pH: 6 (ref 5.0–8.0)

## 2018-08-25 LAB — D-DIMER, QUANTITATIVE: D-Dimer, Quant: 1.81 ug/mL-FEU — ABNORMAL HIGH (ref 0.00–0.50)

## 2018-08-25 LAB — CBG MONITORING, ED: Glucose-Capillary: 229 mg/dL — ABNORMAL HIGH (ref 70–99)

## 2018-08-25 LAB — BRAIN NATRIURETIC PEPTIDE: B Natriuretic Peptide: 38.2 pg/mL (ref 0.0–100.0)

## 2018-08-25 MED ORDER — OMEPRAZOLE 40 MG PO CPDR: 40.0000 mg | DELAYED_RELEASE_CAPSULE | Freq: Every day | ORAL | 0 refills | Status: DC

## 2018-08-25 MED ORDER — LACTATED RINGERS IV BOLUS
1000.0000 mL | Freq: Once | INTRAVENOUS | Status: AC
  Administered 2018-08-25: 1000 mL via INTRAVENOUS

## 2018-08-25 MED ORDER — IOHEXOL 350 MG/ML SOLN
80.0000 mL | Freq: Once | INTRAVENOUS | Status: AC | PRN
  Administered 2018-08-25: 80 mL via INTRAVENOUS

## 2018-08-25 MED ORDER — SODIUM CHLORIDE 0.9% FLUSH: 3.0000 mL | Freq: Once | INTRAVENOUS | Status: DC

## 2018-08-25 NOTE — ED Notes (Signed)
Patient transported to CT 

## 2018-08-25 NOTE — Telephone Encounter (Signed)
Patient has requested a refill on her heartburn medication that she takes as needed. Patient has been informed that medication is being sent as a 30 day supply.

## 2018-08-25 NOTE — ED Provider Notes (Signed)
[facility] EMERGENCY DEPARTMENT Provider Note   CSN: [id number] Arrival date & time: 08/25/18  1858    History   Chief Complaint Chief Complaint  Patient presents with  . Shortness of Breath  . Chest Pain    HPI Courtney Goodman is a 73 y.o. female with history of insulin-dependent type 2 diabetes, COPD, CKD, anemia, CAD, and HTN presenting to ED complaining of right-sided chest pain for the past 2 days.  Patient reports the pain is located in the right side of her chest, is sharp in nature, and is worse with movement or deep breathing.  She rates her pain as a 7 out of 10.  Patient reports some associated shortness of breath and nausea.  She is also complaining of urinary frequency and polydipsia.  Patient reports she becomes intermittently confused at home and is unsure whether or not she has been taking all of her medications as prescribed.  She denies any recent fever, chills, cough, abdominal pain, vomiting, diarrhea, dysuria, or any other complaints.     The history is provided by the patient.    Past Medical History:  Diagnosis Date  . Anemia of chronic disease   . Asthma   . Atherosclerotic heart disease of native coronary artery without angina pectoris   . CKD (chronic kidney disease)   . COPD (chronic obstructive pulmonary disease) ([place])   . DM (diabetes mellitus), type 2 with renal complications ([place])   . Dysrhythmia   . Elevated ferritin 01/03/2017  . GAD (generalized anxiety disorder)   . Hypertension   . Mixed hyperlipidemia due to type 2 diabetes mellitus ([place])   . Mixed incontinence    per medical records from [place]  . Osteopenia   . Personal history of noncompliance with medical treatment, presenting hazards to health   . Shortness of breath dyspnea   . Suicidal ideation   . TB (tuberculosis), treated    age 74  . Vitamin D deficiency     Patient Active Problem List   Diagnosis Date Noted  . GERD (gastroesophageal reflux disease)  08/23/2017  . CAD (coronary artery disease) 08/23/2017  . Hypoglycemia 08/23/2017  . Acute metabolic encephalopathy 36/64/4034  . Hypokalemia 08/23/2017  . Type II diabetes mellitus with renal manifestations ([place]) 08/23/2017  . Elevated ferritin 01/03/2017  . Personal history of noncompliance with medical treatment, presenting hazards to health   . Urinary incontinence, mixed 05/27/2016  . Major depression, recurrent ([place]) 05/27/2016  . Atherosclerotic heart disease of native coronary artery without angina pectoris 05/27/2016  . GAD (generalized anxiety disorder)   . Anemia of chronic disease   . Mixed hyperlipidemia due to type 2 diabetes mellitus ([place])   . Diabetes ([place]) 11/30/2015  . Vitamin D deficiency 11/23/2015  . HTN (hypertension) 11/23/2015  . Anemia 11/23/2015  . CKD (chronic kidney disease), stage III ([place]) 11/23/2015  . Dysrhythmia   . COPD (chronic obstructive pulmonary disease) ([place])     Past Surgical History:  Procedure Laterality Date  . ANKLE RECONSTRUCTION     right  . CARPAL TUNNEL RELEASE    . CORONARY ANGIOPLASTY       OB History   No obstetric history on file.      Home Medications    Prior to Admission medications   Medication Sig Start Date End Date Taking? Authorizing Provider  albuterol (PROVENTIL HFA;VENTOLIN HFA) 108 (90 Base) MCG/ACT inhaler Inhale 2 puffs into the lungs every 6 (six) hours as needed for  wheezing or shortness of breath. 04/14/17   [name], NP-C  Alcohol Swabs (ALCOHOL PREP) PADS Use for testing 08/07/18   Henson, Vickie L, NP-C  amLODipine-benazepril (LOTREL) 5-20 MG capsule Take 1 capsule by mouth daily. 08/07/18   Henson, Vickie L, NP-C  aspirin EC 81 MG tablet Take 81 mg by mouth daily.     [provider]  Biotin 10000 MCG TABS Take 1 tablet by mouth daily.     [provider]  carvedilol (COREG) 6.25 MG tablet Take 1 tablet by mouth twice daily. 08/24/18   Henson, Vickie L, NP-C  cholecalciferol  (VITAMIN D) 25 MCG (1000 UT) tablet Take 1 tablet by mouth daily. 08/07/18   Henson, Vickie L, NP-C  citalopram (CELEXA) 20 MG tablet Take 1 tablet (20 mg total) by mouth daily. 09/05/16   Henson, Vickie L, NP-C  fenofibrate (TRICOR) 145 MG tablet Take 1 tablet (145 mg total) by mouth daily. 08/24/18   Henson, Vickie L, NP-C  fluticasone (FLONASE) 50 MCG/ACT nasal spray Place 2 sprays into both nostrils daily. 07/25/16   Henson, Vickie L, NP-C  folic acid (FOLVITE) 1 MG tablet Take 1 tablet (1 mg total) by mouth daily. 03/30/18   Henson, Vickie L, NP-C  glucose blood (TRUE METRIX BLOOD GLUCOSE TEST) test strip Use to monitor glucose levels twice per day 03/23/18   Renato Shin, MD  Insulin Detemir (LEVEMIR FLEXTOUCH) 100 UNIT/ML Pen Inject 130 Units into the skin every morning. And pen needles 2/day 03/03/18   Renato Shin, MD  Insulin Pen Needle 31G X 5 MM MISC Use to inject insulin three times daily 05/27/18   Renato Shin, MD  liraglutide (VICTOZA) 18 MG/3ML SOPN Inject 1.8 mg subcutaneously daily. 05/27/18   Renato Shin, MD  omeprazole (PRILOSEC) 40 MG capsule Take 1 capsule (40 mg total) by mouth daily. 08/25/18   Henson, Vickie L, NP-C  simvastatin (ZOCOR) 20 MG tablet Take 1 tablet by mouth every night. 08/24/18   Henson, Vickie L, NP-C  traMADol (ULTRAM) 50 MG tablet Take 1 tablet by mouth daily as needed. Patient taking differently: Take 1 tablet by mouth daily as needed. for pain 04/03/17   Henson, Vickie L, NP-C  traZODone (DESYREL) 50 MG tablet Take 50 mg by mouth at bedtime. Patient gets this from her therapist, not PCP    [provider]  triamcinolone (KENALOG) 0.025 % ointment Apply 1 application topically 2 (two) times daily. 06/30/18   Marzetta Board, DPM    Family History Family History  Problem Relation Age of Onset  . Diabetes Mother     Social History Social History   Tobacco Use  . Smoking status: Former Research scientist (life sciences)  . Smokeless tobacco: Never Used  Substance Use  Topics  . Alcohol use: No  . Drug use: No     Allergies   Patient has no known allergies.   Review of Systems Review of Systems  Constitutional: Negative for chills and fever.  HENT: Negative for ear pain and sore throat.   Eyes: Negative for pain and visual disturbance.  Respiratory: Positive for shortness of breath. Negative for cough.   Cardiovascular: Positive for chest pain. Negative for palpitations.  Gastrointestinal: Positive for nausea. Negative for abdominal pain and vomiting.  Endocrine: Positive for polydipsia and polyuria.  Genitourinary: Positive for frequency. Negative for dysuria and hematuria.  Musculoskeletal: Negative for arthralgias and back pain.  Skin: Negative for color change and rash.  Neurological: Negative for seizures and syncope.  All other systems reviewed and are negative.    Physical Exam Updated Vital Signs BP (!) 161/83   Pulse 80   Temp 98.7 F (37.1 C) (Oral)   Resp 18   Ht 5\' 4"  (1.626 m)   Wt 72.6 kg   SpO2 100%   BMI 27.46 kg/m   Physical Exam Vitals signs and nursing note reviewed.  Constitutional:      General: She is not in acute distress.    Appearance: She is well-developed. She is obese. She is not ill-appearing, toxic-appearing or diaphoretic.  HENT:     Head: Normocephalic and atraumatic.     Nose: Nose normal. No congestion or rhinorrhea.     Mouth/Throat:     Mouth: Mucous membranes are moist.     Pharynx: Oropharynx is clear. No oropharyngeal exudate or posterior oropharyngeal erythema.  Eyes:     Extraocular Movements: Extraocular movements intact.     Pupils: Pupils are equal, round, and reactive to light.  Neck:     Musculoskeletal: Normal range of motion and neck supple. No neck rigidity or muscular tenderness.  Cardiovascular:     Rate and Rhythm: Normal rate and regular rhythm.     Pulses: Normal pulses.     Heart sounds: Normal heart sounds. No murmur. No friction rub. No gallop.   Pulmonary:      Effort: Pulmonary effort is normal. No respiratory distress.     Breath sounds: Normal breath sounds. No stridor. No wheezing, rhonchi or rales.  Chest:     Chest wall: Tenderness (to anterior upper right chest wall) present.  Abdominal:     General: Abdomen is flat. There is no distension.     Palpations: Abdomen is soft.     Tenderness: There is no abdominal tenderness. There is no guarding or rebound.  Musculoskeletal: Normal range of motion.        General: No swelling, tenderness, deformity or signs of injury.     Right lower leg: No edema.     Left lower leg: No edema.  Skin:    General: Skin is warm and dry.  Neurological:     General: No focal deficit present.     Mental Status: She is alert and oriented to person, place, and time. Mental status is at baseline.     Cranial Nerves: No cranial nerve deficit.     Sensory: No sensory deficit.     Motor: No weakness.  Psychiatric:        Mood and Affect: Mood normal.        Behavior: Behavior normal.     ED Treatments / Results  Labs (all labs ordered are listed, but only abnormal results are displayed) Labs Reviewed  BASIC METABOLIC PANEL - Abnormal; Notable for the following components:      Result Value   Chloride 97 (*)    Glucose, Bld 447 (*)    BUN 24 (*)    Creatinine, Ser 1.63 (*)    GFR calc non Af Amer 31 (*)    GFR calc Af Amer 36 (*)    All other components within normal limits  CBC - Abnormal; Notable for the following components:   WBC 11.7 (*)    RDW 16.2 (*)    Platelets 401 (*)    All other components within normal limits  URINALYSIS, ROUTINE W REFLEX MICROSCOPIC - Abnormal; Notable for the following components:   Color, Urine STRAW (*)    Glucose, UA >=500 (*)  Hgb urine dipstick SMALL (*)    Leukocytes,Ua SMALL (*)    Bacteria, UA RARE (*)    All other components within normal limits  D-DIMER, QUANTITATIVE (NOT AT [facility]) - Abnormal; Notable for the following components:   D-Dimer, Quant 1.81  (*)    All other components within normal limits  BRAIN NATRIURETIC PEPTIDE  CBG MONITORING, ED  TROPONIN I (HIGH SENSITIVITY)  TROPONIN I (HIGH SENSITIVITY)    EKG None  Radiology Dg Chest 2 View  Result Date: 08/25/2018 CLINICAL DATA:  Chest pain and shortness of breath EXAM: CHEST - 2 VIEW COMPARISON:  August 23, 2017 FINDINGS: There is scarring in the left upper lobe, stable. There is linear atelectasis in the left base. Lungs elsewhere clear. Heart size and pulmonary vascularity are normal. No adenopathy. No bone lesions. IMPRESSION: Left base atelectasis. Scarring left upper lobe. No edema or consolidation. Stable cardiac silhouette. Electronically Signed   By: Lowella Grip III M.D.   On: 08/25/2018 20:13   Ct Angio Chest Pe W And/or Wo Contrast  Result Date: 08/25/2018 CLINICAL DATA:  Right chest pain, elevated D-dimer EXAM: CT ANGIOGRAPHY CHEST WITH CONTRAST TECHNIQUE: Multidetector CT imaging of the chest was performed using the standard protocol during bolus administration of intravenous contrast. Multiplanar CT image reconstructions and MIPs were obtained to evaluate the vascular anatomy. CONTRAST:  25mL OMNIPAQUE IOHEXOL 350 MG/ML SOLN COMPARISON:  Chest radiograph dated 08/25/2018 FINDINGS: Cardiovascular: Satisfactory opacification of the bilateral pulmonary arteries to the segmental level. No evidence of pulmonary embolism. No evidence of thoracic aortic aneurysm or dissection. Mild atherosclerotic calcifications of the aortic arch. Heart is normal in size. No pericardial effusion. Three vessel coronary atherosclerosis. Mediastinum/Nodes: No suspicious mediastinal lymphadenopathy. Calcified prevascular nodes, benign. Visualized thyroid is unremarkable. Lungs/Pleura: Scarring in the left upper lobe. Scarring with bronchiectasis in the anterior left lower lobe. Associated calcifications (series 5/image 93), likely chronic. Mild mosaic attenuation. No suspicious pulmonary nodules. No  focal consolidation. No pleural effusion or pneumothorax. Upper Abdomen: Visualized upper abdomen is grossly unremarkable. Musculoskeletal: Mild degenerative changes of the mid/lower thoracic spine. Review of the MIP images confirms the above findings. IMPRESSION: No evidence of pulmonary embolism. Left upper and lower lobe scarring. No evidence of acute cardiopulmonary disease. Aortic Atherosclerosis (ICD10-I70.0). Electronically Signed   By: Julian Hy M.D.   On: 08/25/2018 22:17    Procedures Procedures (including critical care time)  Medications Ordered in ED Medications  sodium chloride flush (NS) 0.9 % injection 3 mL (has no administration in time range)  lactated ringers bolus 1,000 mL (1,000 mLs Intravenous New Bag/Given 08/25/18 2054)  iohexol (OMNIPAQUE) 350 MG/ML injection 80 mL (80 mLs Intravenous Contrast Given 08/25/18 2149)     Initial Impression / Assessment and Plan / ED Course  I have reviewed the triage vital signs and the nursing notes.  Pertinent labs & imaging results that were available during my care of the patient were reviewed by me and considered in my medical decision making (see chart for details).         Courtney Goodman is a 73 y.o. female with history of insulin-dependent type 2 diabetes, COPD, CKD, anemia, CAD, and HTN presenting to ED complaining of right-sided chest pain for the past 2 days with associated shortness of breath and nausea.  On exam, patient's chest pain is reproducible.  Patient is well-appearing and has stable vitals.  BMP significant for hyperglycemia to 447 without evidence of DKA.  Creatinine is mildly elevated at 1.63  from baseline around 1.5.  CBC shows mildly elevated WBC at 11.7.  D-dimer was obtained to risk stratify for PE given patient's chest pain and shortness of breath and was elevated at 1.81.  Patient was given a bolus of LR due to her hyperglycemia and mild elevation in creatinine.  CTA PE study was obtained showed  no evidence of thromboembolic disease or other pulmonary abnormalities.  Presentation is likely due to musculoskeletal chest pain given that it is worse with movement and palpation.  On recheck, glucose has improved to 329.  Patient was advised to follow closely with her primary care physician and to return to the ED for any worsening of her symptoms.  Patient was discharged home in stable condition.   Final Clinical Impressions(s) / ED Diagnoses   Final diagnoses:  Chest wall pain    ED Discharge Orders    None       Candie Chroman, MD 08/25/18 2725    Davonna Belling, MD 08/26/18 [phone]

## 2018-08-25 NOTE — ED Triage Notes (Signed)
Pt Arrives [facility] from home with c/o right sided   chest pain for 2 days with [facility]. Pt also c/o urinary frequency and thirst. Given 324 aspirin by EMS PTA. Pt alert and oriented x 3

## 2018-08-26 ENCOUNTER — Inpatient Hospital Stay: Payer: Medicare HMO

## 2018-08-27 ENCOUNTER — Telehealth: Payer: Self-pay | Admitting: Medical Oncology

## 2018-08-27 NOTE — Telephone Encounter (Addendum)
Missed lab and  injection yesterday. Recent CBC/diff in ED. Gave pt next appt for 8/11-. She will arrange for her transportation.

## 2018-09-01 ENCOUNTER — Other Ambulatory Visit: Payer: Self-pay

## 2018-09-01 ENCOUNTER — Ambulatory Visit
Admission: RE | Admit: 2018-09-01 | Discharge: 2018-09-01 | Disposition: A | Discharge status: Home / Self Care | Payer: Medicare HMO | Source: Ambulatory Visit | Attending: Family Medicine | Admitting: Family Medicine

## 2018-09-01 DIAGNOSIS — Z1231 Encounter for screening mammogram for malignant neoplasm of breast: Secondary | ICD-10-CM | POA: Diagnosis not present

## 2018-09-03 ENCOUNTER — Ambulatory Visit: Payer: Self-pay | Admitting: *Deleted

## 2018-09-07 ENCOUNTER — Other Ambulatory Visit: Payer: Self-pay | Admitting: *Deleted

## 2018-09-07 ENCOUNTER — Telehealth: Payer: Self-pay | Admitting: Family Medicine

## 2018-09-07 NOTE — Telephone Encounter (Signed)
Refill request for Levemir flextouch u-100 insulin 100 unit/ml (3Ml) pen OMEPRAZOLE 40 MG SIMVASTATIN 20 MG CARVEDILO 6.25 MG CITALOPRAM 20 MG FENOFIBRATE NANOCRYSTALLIZED 145 MG  PLEASE SEND TO [facility] ORDER

## 2018-09-07 NOTE — Telephone Encounter (Signed)
lmom for patient to call back to speak to me

## 2018-09-07 NOTE — Patient Outreach (Signed)
[facility]) Care Management  09/07/2018  Courtney Goodman 1945-05-06 [id number]  RN Health Coach telephone call to patient. The 1st number listed as home the lady answered and stated wrong number [phone]. The second number mobile [phone] her daughter answered and gave me the number of [phone] as the patient number. The number stated the patient is not available.   Plan: RN will call back within 30 days.   [place] Care Management [phone]

## 2018-09-08 ENCOUNTER — Other Ambulatory Visit: Payer: Self-pay

## 2018-09-08 ENCOUNTER — Inpatient Hospital Stay: Payer: Medicare HMO

## 2018-09-08 ENCOUNTER — Inpatient Hospital Stay: Payer: Medicare HMO | Attending: Oncology | Admitting: Internal Medicine

## 2018-09-08 ENCOUNTER — Encounter: Payer: Self-pay | Admitting: Internal Medicine

## 2018-09-08 VITALS — BP 154/77 | HR 81 | Temp 98.7°F | Resp 16 | Ht 64.0 in | Wt 170.4 lb

## 2018-09-08 DIAGNOSIS — N183 Chronic kidney disease, stage 3 unspecified: Secondary | ICD-10-CM

## 2018-09-08 DIAGNOSIS — Z8611 Personal history of tuberculosis: Secondary | ICD-10-CM | POA: Diagnosis not present

## 2018-09-08 DIAGNOSIS — I129 Hypertensive chronic kidney disease with stage 1 through stage 4 chronic kidney disease, or unspecified chronic kidney disease: Secondary | ICD-10-CM | POA: Diagnosis not present

## 2018-09-08 DIAGNOSIS — M8580 Other specified disorders of bone density and structure, unspecified site: Secondary | ICD-10-CM | POA: Diagnosis not present

## 2018-09-08 DIAGNOSIS — E1122 Type 2 diabetes mellitus with diabetic chronic kidney disease: Secondary | ICD-10-CM | POA: Diagnosis not present

## 2018-09-08 DIAGNOSIS — N189 Chronic kidney disease, unspecified: Secondary | ICD-10-CM | POA: Diagnosis not present

## 2018-09-08 DIAGNOSIS — R7989 Other specified abnormal findings of blood chemistry: Secondary | ICD-10-CM | POA: Insufficient documentation

## 2018-09-08 DIAGNOSIS — D631 Anemia in chronic kidney disease: Secondary | ICD-10-CM | POA: Diagnosis not present

## 2018-09-08 DIAGNOSIS — E559 Vitamin D deficiency, unspecified: Secondary | ICD-10-CM | POA: Diagnosis not present

## 2018-09-08 DIAGNOSIS — J449 Chronic obstructive pulmonary disease, unspecified: Secondary | ICD-10-CM | POA: Insufficient documentation

## 2018-09-08 DIAGNOSIS — D638 Anemia in other chronic diseases classified elsewhere: Secondary | ICD-10-CM

## 2018-09-08 DIAGNOSIS — Z79899 Other long term (current) drug therapy: Secondary | ICD-10-CM | POA: Diagnosis not present

## 2018-09-08 DIAGNOSIS — R45851 Suicidal ideations: Secondary | ICD-10-CM | POA: Diagnosis not present

## 2018-09-08 DIAGNOSIS — Z794 Long term (current) use of insulin: Secondary | ICD-10-CM | POA: Diagnosis not present

## 2018-09-08 DIAGNOSIS — E782 Mixed hyperlipidemia: Secondary | ICD-10-CM | POA: Insufficient documentation

## 2018-09-08 LAB — CBC WITH DIFFERENTIAL (CANCER CENTER ONLY)
Abs Immature Granulocytes: 0.03 10*3/uL (ref 0.00–0.07)
Basophils Absolute: 0 10*3/uL (ref 0.0–0.1)
Basophils Relative: 1 %
Eosinophils Absolute: 0.1 10*3/uL (ref 0.0–0.5)
Eosinophils Relative: 2 %
HCT: 35.7 % — ABNORMAL LOW (ref 36.0–46.0)
Hemoglobin: 11.4 g/dL — ABNORMAL LOW (ref 12.0–15.0)
Immature Granulocytes: 0 %
Lymphocytes Relative: 22 %
Lymphs Abs: 1.7 10*3/uL (ref 0.7–4.0)
MCH: 29.2 pg (ref 26.0–34.0)
MCHC: 31.9 g/dL (ref 30.0–36.0)
MCV: 91.3 fL (ref 80.0–100.0)
Monocytes Absolute: 0.4 10*3/uL (ref 0.1–1.0)
Monocytes Relative: 6 %
Neutro Abs: 5.2 10*3/uL (ref 1.7–7.7)
Neutrophils Relative %: 69 %
Platelet Count: 351 10*3/uL (ref 150–400)
RBC: 3.91 MIL/uL (ref 3.87–5.11)
RDW: 15.9 % — ABNORMAL HIGH (ref 11.5–15.5)
WBC Count: 7.5 10*3/uL (ref 4.0–10.5)
nRBC: 0 % (ref 0.0–0.2)

## 2018-09-08 LAB — IRON AND TIBC
Iron: 85 ug/dL (ref 41–142)
Saturation Ratios: 27 % (ref 21–57)
TIBC: 311 ug/dL (ref 236–444)
UIBC: 226 ug/dL (ref 120–384)

## 2018-09-08 LAB — FERRITIN: Ferritin: 393 ng/mL — ABNORMAL HIGH (ref 11–307)

## 2018-09-08 NOTE — Progress Notes (Signed)
[name] [phone]   [phone]  OFFICE PROGRESS NOTE  [name], NP-C [place] [address]  DIAGNOSIS: Anemia of chronic disease secondary to chronic renal insufficiency  PRIOR THERAPY: None  CURRENT THERAPY: Retacrit 300 mcg subcutaneously every 3 weeks in addition to over-the-counter oral iron tablet 1 tablet every day..  INTERVAL HISTORY: Courtney Goodman 73 y.o. female returns to the clinic today for 3 months follow-up visit the patient is feeling fine today with no concerning complaints.  She was seen recently at the emergency department complaining of right-sided chest pain and she had imaging studies performed at that time that were unremarkable for pulmonary embolism or any other acute cardiopulmonary disease.  The patient is feeling fine today except for mild fatigue.  She denied having any chest pain, shortness of breath, cough or hemoptysis.  She denied having any fever or chills.  She has no nausea, vomiting, diarrhea or constipation.  She continues to tolerate her injection with Retacrit fairly well.  She is here today for evaluation and repeat CBC, iron study and ferritin.  MEDICAL HISTORY: Past Medical History:  Diagnosis Date  . Anemia of chronic disease   . Asthma   . Atherosclerotic heart disease of native coronary artery without angina pectoris   . CKD (chronic kidney disease)   . COPD (chronic obstructive pulmonary disease) ([place])   . DM (diabetes mellitus), type 2 with renal complications ([place])   . Dysrhythmia   . Elevated ferritin 01/03/2017  . GAD (generalized anxiety disorder)   . Hypertension   . Mixed hyperlipidemia due to type 2 diabetes mellitus ([place])   . Mixed incontinence    per medical records from [place]  . Osteopenia   . Personal history of noncompliance with medical treatment, presenting hazards to health   . Shortness of breath dyspnea   . Suicidal ideation   . TB  (tuberculosis), treated    age 69  . Vitamin D deficiency     ALLERGIES:  has No Known Allergies.  MEDICATIONS:  Current Outpatient Medications  Medication Sig Dispense Refill  . albuterol (PROVENTIL HFA;VENTOLIN HFA) 108 (90 Base) MCG/ACT inhaler Inhale 2 puffs into the lungs every 6 (six) hours as needed for wheezing or shortness of breath. 1 Inhaler 0  . Alcohol Swabs (ALCOHOL PREP) PADS Use for testing 100 each 1  . amLODipine-benazepril (LOTREL) 5-20 MG capsule Take 1 capsule by mouth daily. 90 capsule 0  . aspirin EC 81 MG tablet Take 81 mg by mouth daily.     . Biotin 10000 MCG TABS Take 1 tablet by mouth daily.     . carvedilol (COREG) 6.25 MG tablet Take 1 tablet by mouth twice daily. 90 tablet 0  . cholecalciferol (VITAMIN D) 25 MCG (1000 UT) tablet Take 1 tablet by mouth daily. 90 tablet 0  . citalopram (CELEXA) 20 MG tablet Take 1 tablet (20 mg total) by mouth daily. 90 tablet 1  . fenofibrate (TRICOR) 145 MG tablet Take 1 tablet (145 mg total) by mouth daily. 90 tablet 0  . fluticasone (FLONASE) 50 MCG/ACT nasal spray Place 2 sprays into both nostrils daily. 48 g 0  . folic acid (FOLVITE) 1 MG tablet Take 1 tablet (1 mg total) by mouth daily. 90 tablet 0  . glucose blood (TRUE METRIX BLOOD GLUCOSE TEST) test strip Use to monitor glucose levels twice per day 100 each 12  . Insulin Detemir (LEVEMIR FLEXTOUCH) 100  UNIT/ML Pen Inject 130 Units into the skin every morning. And pen needles 2/day 15 pen 2  . Insulin Pen Needle 31G X 5 MM MISC Use to inject insulin three times daily 100 each 1  . liraglutide (VICTOZA) 18 MG/3ML SOPN Inject 1.8 mg subcutaneously daily. 27 mL 2  . omeprazole (PRILOSEC) 40 MG capsule Take 1 capsule (40 mg total) by mouth daily. 30 capsule 0  . simvastatin (ZOCOR) 20 MG tablet Take 1 tablet by mouth every night. 90 tablet 0  . traMADol (ULTRAM) 50 MG tablet Take 1 tablet by mouth daily as needed. (Patient taking differently: Take 1 tablet by mouth daily  as needed. for pain) 90 tablet 0  . traZODone (DESYREL) 50 MG tablet Take 50 mg by mouth at bedtime. Patient gets this from her therapist, not PCP    . triamcinolone (KENALOG) 0.025 % ointment Apply 1 application topically 2 (two) times daily. 80 g 1   No current facility-administered medications for this visit.     SURGICAL HISTORY:  Past Surgical History:  Procedure Laterality Date  . ANKLE RECONSTRUCTION     right  . CARPAL TUNNEL RELEASE    . CORONARY ANGIOPLASTY      REVIEW OF SYSTEMS:  A comprehensive review of systems was negative except for: Constitutional: positive for fatigue   PHYSICAL EXAMINATION: General appearance: alert, cooperative and no distress Head: Normocephalic, without obvious abnormality, atraumatic Neck: no adenopathy, no JVD, supple, symmetrical, trachea midline and thyroid not enlarged, symmetric, no tenderness/mass/nodules Lymph nodes: Cervical, supraclavicular, and axillary nodes normal. Resp: clear to auscultation bilaterally Back: symmetric, no curvature. ROM normal. No CVA tenderness. Cardio: regular rate and rhythm, S1, S2 normal, no murmur, click, rub or gallop GI: soft, non-tender; bowel sounds normal; no masses,  no organomegaly Extremities: extremities normal, atraumatic, no cyanosis or edema  ECOG PERFORMANCE STATUS: 1 - Symptomatic but completely ambulatory  Blood pressure (!) 154/77, pulse 81, temperature 98.7 F (37.1 C), temperature source Temporal, resp. rate 16, height 5\' 4"  (1.626 m), weight 170 lb 6 oz (77.3 kg), SpO2 100 %.  LABORATORY DATA: Lab Results  Component Value Date   WBC 7.5 09/08/2018   HGB 11.4 (L) 09/08/2018   HCT 35.7 (L) 09/08/2018   MCV 91.3 09/08/2018   PLT 351 09/08/2018      Chemistry      Component Value Date/Time   NA 135 08/25/2018 1927   NA 146 (H) 05/07/2017 1147   NA 137 05/29/2011 1946   K 3.5 08/25/2018 1927   K 3.9 05/29/2011 1946   CL 97 (L) 08/25/2018 1927   CL 100 05/29/2011 1946   CO2  28 08/25/2018 1927   CO2 28 05/29/2011 1946   BUN 24 (H) 08/25/2018 1927   BUN 28 (H) 05/07/2017 1147   BUN 24 (H) 05/29/2011 1946   CREATININE 1.63 (H) 08/25/2018 1927   CREATININE 1.52 (H) 04/08/2018 1154   CREATININE 1.56 (H) 01/01/2017 0941      Component Value Date/Time   CALCIUM 10.1 08/25/2018 1927   CALCIUM 9.8 05/29/2011 1946   ALKPHOS 86 04/08/2018 1154   ALKPHOS 86 05/29/2011 1946   AST 14 (L) 04/08/2018 1154   ALT 13 04/08/2018 1154   ALT 15 05/29/2011 1946   BILITOT 0.4 04/08/2018 1154       RADIOGRAPHIC STUDIES: Dg Chest 2 View  Result Date: 08/25/2018 CLINICAL DATA:  Chest pain and shortness of breath EXAM: CHEST - 2 VIEW COMPARISON:  August 23, 2017 FINDINGS:  There is scarring in the left upper lobe, stable. There is linear atelectasis in the left base. Lungs elsewhere clear. Heart size and pulmonary vascularity are normal. No adenopathy. No bone lesions. IMPRESSION: Left base atelectasis. Scarring left upper lobe. No edema or consolidation. Stable cardiac silhouette. Electronically Signed   By: Lowella Grip III M.D.   On: 08/25/2018 20:13   Ct Angio Chest Pe W And/or Wo Contrast  Result Date: 08/25/2018 CLINICAL DATA:  Right chest pain, elevated D-dimer EXAM: CT ANGIOGRAPHY CHEST WITH CONTRAST TECHNIQUE: Multidetector CT imaging of the chest was performed using the standard protocol during bolus administration of intravenous contrast. Multiplanar CT image reconstructions and MIPs were obtained to evaluate the vascular anatomy. CONTRAST:  1mL OMNIPAQUE IOHEXOL 350 MG/ML SOLN COMPARISON:  Chest radiograph dated 08/25/2018 FINDINGS: Cardiovascular: Satisfactory opacification of the bilateral pulmonary arteries to the segmental level. No evidence of pulmonary embolism. No evidence of thoracic aortic aneurysm or dissection. Mild atherosclerotic calcifications of the aortic arch. Heart is normal in size. No pericardial effusion. Three vessel coronary atherosclerosis.  Mediastinum/Nodes: No suspicious mediastinal lymphadenopathy. Calcified prevascular nodes, benign. Visualized thyroid is unremarkable. Lungs/Pleura: Scarring in the left upper lobe. Scarring with bronchiectasis in the anterior left lower lobe. Associated calcifications (series 5/image 93), likely chronic. Mild mosaic attenuation. No suspicious pulmonary nodules. No focal consolidation. No pleural effusion or pneumothorax. Upper Abdomen: Visualized upper abdomen is grossly unremarkable. Musculoskeletal: Mild degenerative changes of the mid/lower thoracic spine. Review of the MIP images confirms the above findings. IMPRESSION: No evidence of pulmonary embolism. Left upper and lower lobe scarring. No evidence of acute cardiopulmonary disease. Aortic Atherosclerosis (ICD10-I70.0). Electronically Signed   By: Julian Hy M.D.   On: 08/25/2018 22:17   Mm 3d Screen Breast Bilateral  Result Date: 09/01/2018 CLINICAL DATA:  Screening. EXAM: DIGITAL SCREENING BILATERAL MAMMOGRAM WITH TOMO AND CAD COMPARISON:  Previous exam(s). ACR Breast Density Category a: The breast tissue is almost entirely fatty. FINDINGS: There are no findings suspicious for malignancy. Images were processed with CAD. IMPRESSION: No mammographic evidence of malignancy. A result letter of this screening mammogram will be mailed directly to the patient. RECOMMENDATION: Screening mammogram in one year. (Code:[id number]-01Y) BI-RADS CATEGORY  1: Negative. Electronically Signed   By: Lajean Manes M.D.   On: 09/01/2018 14:12    ASSESSMENT AND PLAN: This is a very pleasant 73 years old African-American female with anemia of chronic disease secondary to chronic renal insufficiency.  The patient is currently undergoing treatment with Retacrit injections 300 mcg subcutaneously every 2 weeks. She has been tolerating the treatment well. I recommended for her to continue her current treatment with the same injection as well as the oral iron tablets. She  will come back for follow-up visit in 3 months for evaluation with repeat CBC, iron study and ferritin. She was advised to call immediately if she has any concerning symptoms in the interval. The patient voices understanding of current disease status and treatment options and is in agreement with the current care plan. All questions were answered. The patient knows to call the clinic with any problems, questions or concerns. We can certainly see the patient much sooner if necessary.  I spent 10 minutes counseling the patient face to face. The total time spent in the appointment was 15 minutes.  Disclaimer: This note was dictated with voice recognition software. Similar sounding words can inadvertently be transcribed and may not be corrected upon review.

## 2018-09-09 ENCOUNTER — Telehealth: Payer: Self-pay | Admitting: Internal Medicine

## 2018-09-09 NOTE — Telephone Encounter (Signed)
Scheduled appt per 8/11 los. - pt aware of next appt and wanted the rest mailed.

## 2018-09-11 NOTE — Telephone Encounter (Signed)
Tried contacting patient again to verify whether she wants her medications to go to [facility]

## 2018-09-14 ENCOUNTER — Other Ambulatory Visit: Payer: Self-pay

## 2018-09-14 DIAGNOSIS — Z794 Long term (current) use of insulin: Secondary | ICD-10-CM

## 2018-09-14 DIAGNOSIS — E1122 Type 2 diabetes mellitus with diabetic chronic kidney disease: Secondary | ICD-10-CM

## 2018-09-14 MED ORDER — INSULIN PEN NEEDLE 31G X 5 MM MISC: 1.0000 | Freq: Two times a day (BID) | 1 refills | Status: DC

## 2018-09-14 MED ORDER — LEVEMIR FLEXTOUCH 100 UNIT/ML ~~LOC~~ SOPN: 130.0000 [IU] | PEN_INJECTOR | SUBCUTANEOUS | 2 refills | Status: DC

## 2018-09-14 NOTE — Telephone Encounter (Signed)
Tried to call pt back to find out if she wants her meds to go to [facility] order or pillpack. She has been using pill pack

## 2018-09-15 ENCOUNTER — Ambulatory Visit (INDEPENDENT_AMBULATORY_CARE_PROVIDER_SITE_OTHER): Payer: Medicare HMO | Admitting: Endocrinology

## 2018-09-15 ENCOUNTER — Other Ambulatory Visit: Payer: Self-pay

## 2018-09-15 ENCOUNTER — Encounter: Payer: Self-pay | Admitting: Endocrinology

## 2018-09-15 VITALS — BP 124/64 | HR 74 | Ht 64.0 in | Wt 168.2 lb

## 2018-09-15 DIAGNOSIS — Z794 Long term (current) use of insulin: Secondary | ICD-10-CM

## 2018-09-15 DIAGNOSIS — N183 Chronic kidney disease, stage 3 unspecified: Secondary | ICD-10-CM

## 2018-09-15 DIAGNOSIS — E1122 Type 2 diabetes mellitus with diabetic chronic kidney disease: Secondary | ICD-10-CM | POA: Diagnosis not present

## 2018-09-15 LAB — POCT GLYCOSYLATED HEMOGLOBIN (HGB A1C): Hemoglobin A1C: 9.8 % — AB (ref 4.0–5.6)

## 2018-09-15 MED ORDER — TRESIBA FLEXTOUCH 200 UNIT/ML ~~LOC~~ SOPN: 100.0000 [IU] | PEN_INJECTOR | Freq: Every day | SUBCUTANEOUS | 3 refills | Status: DC

## 2018-09-15 NOTE — Patient Instructions (Addendum)
check your blood sugar twice a day.  vary the time of day when you check, between before the 3 meals, and at bedtime.  also check if you have symptoms of your blood sugar being too high or too low.  please keep a record of the readings and bring it to your next appointment here (or you can bring the meter itself).  You can write it on any piece of paper.  please call us sooner if your blood sugar goes below 70, or if you have a lot of readings over 200.   I have sent a prescription to your pharmacy, to change levemir to tresiba, 100 units per day.   On this type of insulin schedule, you should eat meals on a regular schedule.  If a meal is missed or significantly delayed, your blood sugar could go low.   It is best to never miss the medication.  However, if you do miss it, next best is to make it up when you can.   Please come back for a follow-up appointment in 3-4 months.

## 2018-09-15 NOTE — Progress Notes (Signed)
Subjective:    Patient ID: Courtney Goodman, female    DOB: 1945-09-25, 73 y.o.   MRN: [id number]  HPI Pt returns for f/u of diabetes mellitus: DM type: Insulin-requiring type 2 Dx'ed: 9937 Complications: renal insufficiency and CAD.  Therapy: insulin since 2009, and victoza. GDM: never DKA: never Severe hypoglycemia: 2012 and 2019.   Pancreatitis: never.  Other: she is on qd insulin, due to h/o noncompliance.   Interval history: she does not know how cbg's have been recently.  It is in general lowest in the afternoon.  She was recently in the ER for hyperglycemia.  She says she sometimes misses the insulin.   Past Medical History:  Diagnosis Date  . Anemia of chronic disease   . Asthma   . Atherosclerotic heart disease of native coronary artery without angina pectoris   . CKD (chronic kidney disease)   . COPD (chronic obstructive pulmonary disease) ([place])   . DM (diabetes mellitus), type 2 with renal complications ([place])   . Dysrhythmia   . Elevated ferritin 01/03/2017  . GAD (generalized anxiety disorder)   . Hypertension   . Mixed hyperlipidemia due to type 2 diabetes mellitus ([place])   . Mixed incontinence    per medical records from [place]  . Osteopenia   . Personal history of noncompliance with medical treatment, presenting hazards to health   . Shortness of breath dyspnea   . Suicidal ideation   . TB (tuberculosis), treated    age 35  . Vitamin D deficiency     Past Surgical History:  Procedure Laterality Date  . ANKLE RECONSTRUCTION     right  . CARPAL TUNNEL RELEASE    . CORONARY ANGIOPLASTY      Social History   Socioeconomic History  . Marital status: Divorced    Spouse name: Not on file  . Number of children: Not on file  . Years of education: Not on file  . Highest education level: Not on file  Occupational History  . Not on file  Social Needs  . Financial resource strain: Not on file  . Food insecurity    Worry: Not on file    Inability: Not on  file  . Transportation needs    Medical: Not on file    Non-medical: Not on file  Tobacco Use  . Smoking status: Former Research scientist (life sciences)  . Smokeless tobacco: Never Used  Substance and Sexual Activity  . Alcohol use: No  . Drug use: No  . Sexual activity: Never  Lifestyle  . Physical activity    Days per week: Not on file    Minutes per session: Not on file  . Stress: Not on file  Relationships  . Social Herbalist on phone: Not on file    Gets together: Not on file    Attends religious service: Not on file    Active member of club or organization: Not on file    Attends meetings of clubs or organizations: Not on file    Relationship status: Not on file  . Intimate partner violence    Fear of current or ex partner: Not on file    Emotionally abused: Not on file    Physically abused: Not on file    Forced sexual activity: Not on file  Other Topics Concern  . Not on file  Social History Narrative  . Not on file    Current Outpatient Medications on File Prior to Visit  Medication Sig  Dispense Refill  . albuterol (PROVENTIL HFA;VENTOLIN HFA) 108 (90 Base) MCG/ACT inhaler Inhale 2 puffs into the lungs every 6 (six) hours as needed for wheezing or shortness of breath. 1 Inhaler 0  . Alcohol Swabs (ALCOHOL PREP) PADS Use for testing 100 each 1  . amLODipine-benazepril (LOTREL) 5-20 MG capsule Take 1 capsule by mouth daily. 90 capsule 0  . aspirin EC 81 MG tablet Take 81 mg by mouth daily.     . Biotin 10000 MCG TABS Take 1 tablet by mouth daily.     . carvedilol (COREG) 6.25 MG tablet Take 1 tablet by mouth twice daily. 90 tablet 0  . cholecalciferol (VITAMIN D) 25 MCG (1000 UT) tablet Take 1 tablet by mouth daily. 90 tablet 0  . citalopram (CELEXA) 20 MG tablet Take 1 tablet (20 mg total) by mouth daily. 90 tablet 1  . fenofibrate (TRICOR) 145 MG tablet Take 1 tablet (145 mg total) by mouth daily. 90 tablet 0  . fluticasone (FLONASE) 50 MCG/ACT nasal spray Place 2 sprays into  both nostrils daily. 48 g 0  . folic acid (FOLVITE) 1 MG tablet Take 1 tablet (1 mg total) by mouth daily. 90 tablet 0  . glucose blood (TRUE METRIX BLOOD GLUCOSE TEST) test strip Use to monitor glucose levels twice per day 100 each 12  . Insulin Pen Needle 31G X 5 MM MISC Inject 1 each into the skin 2 (two) times daily. Use to inject insulin twice daily; E11.29 100 each 1  . liraglutide (VICTOZA) 18 MG/3ML SOPN Inject 1.8 mg subcutaneously daily. 27 mL 2  . omeprazole (PRILOSEC) 40 MG capsule Take 1 capsule (40 mg total) by mouth daily. 30 capsule 0  . simvastatin (ZOCOR) 20 MG tablet Take 1 tablet by mouth every night. 90 tablet 0  . traMADol (ULTRAM) 50 MG tablet Take 1 tablet by mouth daily as needed. (Patient taking differently: Take 1 tablet by mouth daily as needed. for pain) 90 tablet 0  . traZODone (DESYREL) 50 MG tablet Take 50 mg by mouth at bedtime. Patient gets this from her therapist, not PCP    . triamcinolone (KENALOG) 0.025 % ointment Apply 1 application topically 2 (two) times daily. 80 g 1   No current facility-administered medications on file prior to visit.     No Known Allergies  Family History  Problem Relation Age of Onset  . Diabetes Mother     BP 124/64 (BP Location: Left Arm, Patient Position: Sitting, Cuff Size: Large)   Pulse 74   Ht 5\' 4"  (1.626 m)   Wt 168 lb 3.2 oz (76.3 kg)   SpO2 93%   BMI 28.87 kg/m    Review of Systems She denies hypoglycemia.      Objective:   Physical Exam VITAL SIGNS:  See vs page GENERAL: no distress Pulses: dorsalis pedis intact bilat.   MSK: no deformity of the feet CV: trace bilat leg edema Skin:  no ulcer on the feet.  normal color and temp on the feet. Neuro: sensation is intact to touch on the feet.  Ext: there is bilateral onychomycosis of the toenails.    Lab Results  Component Value Date   HGBA1C 9.8 (A) 09/15/2018       Assessment & Plan:  Insulin-requiring type 2 DM, with CAD: worse. Edema: This  limits rx options. Noncompliance with insulin.  We'll change to tresiba, for dosing flexibility.  Patient Instructions  check your blood sugar twice a day.  vary  the time of day when you check, between before the 3 meals, and at bedtime.  also check if you have symptoms of your blood sugar being too high or too low.  please keep a record of the readings and bring it to your next appointment here (or you can bring the meter itself).  You can write it on any piece of paper.  please call us sooner if your blood sugar goes below 70, or if you have a lot of readings over 200.   I have sent a prescription to your pharmacy, to change levemir to tresiba, 100 units per day.   On this type of insulin schedule, you should eat meals on a regular schedule.  If a meal is missed or significantly delayed, your blood sugar could go low.   It is best to never miss the medication.  However, if you do miss it, next best is to make it up when you can.   Please come back for a follow-up appointment in 3-4 months.

## 2018-09-16 ENCOUNTER — Telehealth: Payer: Self-pay | Admitting: Endocrinology

## 2018-09-16 MED ORDER — TRESIBA FLEXTOUCH 200 UNIT/ML ~~LOC~~ SOPN: 100.0000 [IU] | PEN_INJECTOR | Freq: Every day | SUBCUTANEOUS | 3 refills | Status: DC

## 2018-09-16 NOTE — Telephone Encounter (Signed)
Tried to call pt, LMTCB

## 2018-09-16 NOTE — Telephone Encounter (Signed)
Thank you. However, if this is a refill too soon and would have to pay out of pocket, she would like to know what dose of Regular insulin to take?

## 2018-09-16 NOTE — Telephone Encounter (Signed)
Called pt for clarification. States her Tyler Aas will not be received from her mail service until 10/06/18. Asked if she would like a Rx sent to a local pharmacy to have filled until it has been received. Advised my only concern is that it may be a refill too soon and could result in pt having to pay out of pocket. Advised I will also send a message to Dr. Loanne Drilling to determine how he would like to proceed with her regular insulin dosage should she have to pay out of pocket for Tresiba. Advised I will call later today with an answer to her question/concern.  Please advise

## 2018-09-16 NOTE — Telephone Encounter (Signed)
LVM requesting returned call 

## 2018-09-16 NOTE — Telephone Encounter (Signed)
I don't know for sure.  However, as this is a change in medication, they usually pay for this.

## 2018-09-16 NOTE — Telephone Encounter (Signed)
Ok, I have sent a prescription to [facility]

## 2018-09-16 NOTE — Telephone Encounter (Signed)
Patient states that Courtney Goodman will be not be available until of September 8th. She would like to know if she could continue the old RX until then.  Please Advise, Thanks

## 2018-09-16 NOTE — Telephone Encounter (Signed)
SECOND ATTEMPT:  Again, called pt. LVM requesting returned call.

## 2018-09-17 NOTE — Telephone Encounter (Signed)
FINAL ATTEMPT:  Called pt to inform about below. LVM requesting returned call.

## 2018-09-17 NOTE — Telephone Encounter (Signed)
Pt finally returned call. Informed per Dr. Cordelia Pen advise, he would prefer pt remain on Tresiba. He sent a Rx to local pharmacy for refill. Per Dr. Loanne Drilling, IF pt picks up Rx and the cost is substantial, advised she call to let us know. Dr. Loanne Drilling will provide dosage for Regular insulin ONLY if unable to afford out of pocket expense for [place]. Verbalized acceptance and understanding.

## 2018-09-21 ENCOUNTER — Encounter (HOSPITAL_COMMUNITY): Payer: Self-pay | Admitting: Emergency Medicine

## 2018-09-21 ENCOUNTER — Other Ambulatory Visit: Payer: Self-pay

## 2018-09-21 ENCOUNTER — Emergency Department (HOSPITAL_COMMUNITY)
Admission: EM | Admit: 2018-09-21 | Discharge: 2018-09-22 | Disposition: A | Discharge status: Home / Self Care | Payer: Medicare HMO | Source: Home / Self Care

## 2018-09-21 DIAGNOSIS — I10 Essential (primary) hypertension: Secondary | ICD-10-CM | POA: Diagnosis not present

## 2018-09-21 DIAGNOSIS — R42 Dizziness and giddiness: Secondary | ICD-10-CM | POA: Diagnosis not present

## 2018-09-21 DIAGNOSIS — R410 Disorientation, unspecified: Secondary | ICD-10-CM | POA: Diagnosis not present

## 2018-09-21 DIAGNOSIS — Z5321 Procedure and treatment not carried out due to patient leaving prior to being seen by health care provider: Secondary | ICD-10-CM | POA: Insufficient documentation

## 2018-09-21 DIAGNOSIS — R112 Nausea with vomiting, unspecified: Secondary | ICD-10-CM | POA: Diagnosis not present

## 2018-09-21 DIAGNOSIS — R1110 Vomiting, unspecified: Secondary | ICD-10-CM | POA: Insufficient documentation

## 2018-09-21 DIAGNOSIS — R110 Nausea: Secondary | ICD-10-CM | POA: Diagnosis not present

## 2018-09-21 LAB — CBC
HCT: 39 % (ref 36.0–46.0)
Hemoglobin: 12.6 g/dL (ref 12.0–15.0)
MCH: 29 pg (ref 26.0–34.0)
MCHC: 32.3 g/dL (ref 30.0–36.0)
MCV: 89.7 fL (ref 80.0–100.0)
Platelets: 452 10*3/uL — ABNORMAL HIGH (ref 150–400)
RBC: 4.35 MIL/uL (ref 3.87–5.11)
RDW: 15.3 % (ref 11.5–15.5)
WBC: 9 10*3/uL (ref 4.0–10.5)
nRBC: 0 % (ref 0.0–0.2)

## 2018-09-21 NOTE — ED Triage Notes (Addendum)
Patient arrived with EMS from home , reports nausea and vomitting today , denies diarrhea or abdominal pain , no fever or chills . CBG= 165

## 2018-09-22 ENCOUNTER — Inpatient Hospital Stay: Payer: Medicare HMO

## 2018-09-22 ENCOUNTER — Encounter (HOSPITAL_COMMUNITY): Payer: Self-pay | Admitting: Emergency Medicine

## 2018-09-22 ENCOUNTER — Inpatient Hospital Stay (HOSPITAL_COMMUNITY)
Admission: EM | Admit: 2018-09-22 | Discharge: 2018-09-24 | DRG: 683 | Disposition: A | Discharge status: Home Health | Payer: Medicare HMO | Attending: Internal Medicine | Admitting: Internal Medicine

## 2018-09-22 ENCOUNTER — Telehealth: Payer: Self-pay | Admitting: Endocrinology

## 2018-09-22 DIAGNOSIS — N179 Acute kidney failure, unspecified: Secondary | ICD-10-CM | POA: Diagnosis present

## 2018-09-22 DIAGNOSIS — I10 Essential (primary) hypertension: Secondary | ICD-10-CM | POA: Diagnosis present

## 2018-09-22 DIAGNOSIS — R824 Acetonuria: Secondary | ICD-10-CM | POA: Diagnosis present

## 2018-09-22 DIAGNOSIS — Z9861 Coronary angioplasty status: Secondary | ICD-10-CM

## 2018-09-22 DIAGNOSIS — E1129 Type 2 diabetes mellitus with other diabetic kidney complication: Secondary | ICD-10-CM | POA: Diagnosis present

## 2018-09-22 DIAGNOSIS — Z79899 Other long term (current) drug therapy: Secondary | ICD-10-CM | POA: Diagnosis not present

## 2018-09-22 DIAGNOSIS — Z833 Family history of diabetes mellitus: Secondary | ICD-10-CM | POA: Diagnosis not present

## 2018-09-22 DIAGNOSIS — M8580 Other specified disorders of bone density and structure, unspecified site: Secondary | ICD-10-CM | POA: Diagnosis present

## 2018-09-22 DIAGNOSIS — I129 Hypertensive chronic kidney disease with stage 1 through stage 4 chronic kidney disease, or unspecified chronic kidney disease: Secondary | ICD-10-CM | POA: Diagnosis not present

## 2018-09-22 DIAGNOSIS — R112 Nausea with vomiting, unspecified: Secondary | ICD-10-CM | POA: Diagnosis present

## 2018-09-22 DIAGNOSIS — F411 Generalized anxiety disorder: Secondary | ICD-10-CM | POA: Diagnosis present

## 2018-09-22 DIAGNOSIS — F332 Major depressive disorder, recurrent severe without psychotic features: Secondary | ICD-10-CM | POA: Diagnosis not present

## 2018-09-22 DIAGNOSIS — E1122 Type 2 diabetes mellitus with diabetic chronic kidney disease: Secondary | ICD-10-CM | POA: Diagnosis present

## 2018-09-22 DIAGNOSIS — N184 Chronic kidney disease, stage 4 (severe): Secondary | ICD-10-CM | POA: Diagnosis present

## 2018-09-22 DIAGNOSIS — I2510 Atherosclerotic heart disease of native coronary artery without angina pectoris: Secondary | ICD-10-CM | POA: Diagnosis present

## 2018-09-22 DIAGNOSIS — Z20828 Contact with and (suspected) exposure to other viral communicable diseases: Secondary | ICD-10-CM | POA: Diagnosis not present

## 2018-09-22 DIAGNOSIS — Z7982 Long term (current) use of aspirin: Secondary | ICD-10-CM

## 2018-09-22 DIAGNOSIS — Z794 Long term (current) use of insulin: Secondary | ICD-10-CM

## 2018-09-22 DIAGNOSIS — Z87891 Personal history of nicotine dependence: Secondary | ICD-10-CM | POA: Diagnosis not present

## 2018-09-22 DIAGNOSIS — D72829 Elevated white blood cell count, unspecified: Secondary | ICD-10-CM | POA: Diagnosis present

## 2018-09-22 DIAGNOSIS — R296 Repeated falls: Secondary | ICD-10-CM | POA: Diagnosis present

## 2018-09-22 DIAGNOSIS — E782 Mixed hyperlipidemia: Secondary | ICD-10-CM | POA: Diagnosis present

## 2018-09-22 DIAGNOSIS — F339 Major depressive disorder, recurrent, unspecified: Secondary | ICD-10-CM | POA: Diagnosis present

## 2018-09-22 DIAGNOSIS — N183 Chronic kidney disease, stage 3 unspecified: Secondary | ICD-10-CM | POA: Diagnosis present

## 2018-09-22 DIAGNOSIS — J449 Chronic obstructive pulmonary disease, unspecified: Secondary | ICD-10-CM | POA: Diagnosis not present

## 2018-09-22 DIAGNOSIS — E559 Vitamin D deficiency, unspecified: Secondary | ICD-10-CM | POA: Diagnosis present

## 2018-09-22 DIAGNOSIS — R1110 Vomiting, unspecified: Secondary | ICD-10-CM | POA: Insufficient documentation

## 2018-09-22 LAB — COMPREHENSIVE METABOLIC PANEL
ALT: 12 U/L (ref 0–44)
ALT: 13 U/L (ref 0–44)
AST: 17 U/L (ref 15–41)
AST: 19 U/L (ref 15–41)
Albumin: 4 g/dL (ref 3.5–5.0)
Albumin: 4.2 g/dL (ref 3.5–5.0)
Alkaline Phosphatase: 55 U/L (ref 38–126)
Alkaline Phosphatase: 55 U/L (ref 38–126)
Anion gap: 12 (ref 5–15)
Anion gap: 15 (ref 5–15)
BUN: 30 mg/dL — ABNORMAL HIGH (ref 8–23)
BUN: 48 mg/dL — ABNORMAL HIGH (ref 8–23)
CO2: 24 mmol/L (ref 22–32)
CO2: 25 mmol/L (ref 22–32)
Calcium: 10.4 mg/dL — ABNORMAL HIGH (ref 8.9–10.3)
Calcium: 10.9 mg/dL — ABNORMAL HIGH (ref 8.9–10.3)
Chloride: 100 mmol/L (ref 98–111)
Chloride: 97 mmol/L — ABNORMAL LOW (ref 98–111)
Creatinine, Ser: 1.79 mg/dL — ABNORMAL HIGH (ref 0.44–1.00)
Creatinine, Ser: 3.48 mg/dL — ABNORMAL HIGH (ref 0.44–1.00)
GFR calc Af Amer: 14 mL/min — ABNORMAL LOW (ref >60)
GFR calc Af Amer: 32 mL/min — ABNORMAL LOW (ref >60)
GFR calc non Af Amer: 12 mL/min — ABNORMAL LOW (ref >60)
GFR calc non Af Amer: 28 mL/min — ABNORMAL LOW (ref >60)
Glucose, Bld: 175 mg/dL — ABNORMAL HIGH (ref 70–99)
Glucose, Bld: 180 mg/dL — ABNORMAL HIGH (ref 70–99)
Potassium: 3.7 mmol/L (ref 3.5–5.1)
Potassium: 3.9 mmol/L (ref 3.5–5.1)
Sodium: 134 mmol/L — ABNORMAL LOW (ref 135–145)
Sodium: 139 mmol/L (ref 135–145)
Total Bilirubin: 0.8 mg/dL (ref 0.3–1.2)
Total Bilirubin: 1 mg/dL (ref 0.3–1.2)
Total Protein: 8.7 g/dL — ABNORMAL HIGH (ref 6.5–8.1)
Total Protein: 8.9 g/dL — ABNORMAL HIGH (ref 6.5–8.1)

## 2018-09-22 LAB — CBC WITH DIFFERENTIAL/PLATELET
Abs Immature Granulocytes: 0.08 10*3/uL — ABNORMAL HIGH (ref 0.00–0.07)
Basophils Absolute: 0.1 10*3/uL (ref 0.0–0.1)
Basophils Relative: 1 %
Eosinophils Absolute: 0.1 10*3/uL (ref 0.0–0.5)
Eosinophils Relative: 0 %
HCT: 39.8 % (ref 36.0–46.0)
Hemoglobin: 12.8 g/dL (ref 12.0–15.0)
Immature Granulocytes: 1 %
Lymphocytes Relative: 24 %
Lymphs Abs: 3.7 10*3/uL (ref 0.7–4.0)
MCH: 29.7 pg (ref 26.0–34.0)
MCHC: 32.2 g/dL (ref 30.0–36.0)
MCV: 92.3 fL (ref 80.0–100.0)
Monocytes Absolute: 1.1 10*3/uL — ABNORMAL HIGH (ref 0.1–1.0)
Monocytes Relative: 7 %
Neutro Abs: 10.4 10*3/uL — ABNORMAL HIGH (ref 1.7–7.7)
Neutrophils Relative %: 67 %
Platelets: 401 10*3/uL — ABNORMAL HIGH (ref 150–400)
RBC: 4.31 MIL/uL (ref 3.87–5.11)
RDW: 15.7 % — ABNORMAL HIGH (ref 11.5–15.5)
WBC: 15.4 10*3/uL — ABNORMAL HIGH (ref 4.0–10.5)
nRBC: 0 % (ref 0.0–0.2)

## 2018-09-22 LAB — URINALYSIS, ROUTINE W REFLEX MICROSCOPIC
Glucose, UA: 100 mg/dL — AB
Ketones, ur: 15 mg/dL — AB
Nitrite: NEGATIVE
Protein, ur: 100 mg/dL — AB
Specific Gravity, Urine: >1.03 — ABNORMAL HIGH (ref 1.005–1.030)
pH: 5 (ref 5.0–8.0)

## 2018-09-22 LAB — LIPASE, BLOOD
Lipase: 25 U/L (ref 11–51)
Lipase: 29 U/L (ref 11–51)

## 2018-09-22 LAB — URINALYSIS, MICROSCOPIC (REFLEX)

## 2018-09-22 LAB — CBG MONITORING, ED: Glucose-Capillary: 186 mg/dL — ABNORMAL HIGH (ref 70–99)

## 2018-09-22 LAB — GLUCOSE, CAPILLARY: Glucose-Capillary: 182 mg/dL — ABNORMAL HIGH (ref 70–99)

## 2018-09-22 MED ORDER — INSULIN ASPART 100 UNIT/ML ~~LOC~~ SOLN
0.0000 [IU] | Freq: Every day | SUBCUTANEOUS | Status: DC
  Administered 2018-09-23: 3 [IU] via SUBCUTANEOUS

## 2018-09-22 MED ORDER — ONDANSETRON HCL 4 MG/2ML IJ SOLN
4.0000 mg | Freq: Once | INTRAMUSCULAR | Status: AC
  Administered 2018-09-22: 21:00:00 4 mg via INTRAVENOUS
  Filled 2018-09-22: qty 2

## 2018-09-22 MED ORDER — ALBUTEROL SULFATE (2.5 MG/3ML) 0.083% IN NEBU: 3.0000 mL | INHALATION_SOLUTION | Freq: Four times a day (QID) | RESPIRATORY_TRACT | Status: DC | PRN

## 2018-09-22 MED ORDER — SODIUM CHLORIDE 0.9 % IV BOLUS
500.0000 mL | Freq: Once | INTRAVENOUS | Status: AC
  Administered 2018-09-22: 22:00:00 500 mL via INTRAVENOUS

## 2018-09-22 MED ORDER — ONDANSETRON HCL 4 MG PO TABS: 4.0000 mg | ORAL_TABLET | Freq: Four times a day (QID) | ORAL | Status: DC | PRN

## 2018-09-22 MED ORDER — ACETAMINOPHEN 325 MG PO TABS: 650.0000 mg | ORAL_TABLET | Freq: Four times a day (QID) | ORAL | Status: DC | PRN

## 2018-09-22 MED ORDER — ACETAMINOPHEN 650 MG RE SUPP: 650.0000 mg | Freq: Four times a day (QID) | RECTAL | Status: DC | PRN

## 2018-09-22 MED ORDER — SODIUM CHLORIDE 0.9 % IV BOLUS
500.0000 mL | Freq: Once | INTRAVENOUS | Status: AC
  Administered 2018-09-22: 21:00:00 500 mL via INTRAVENOUS

## 2018-09-22 MED ORDER — ONDANSETRON HCL 4 MG/2ML IJ SOLN: 4.0000 mg | Freq: Four times a day (QID) | INTRAMUSCULAR | Status: DC | PRN

## 2018-09-22 MED ORDER — HEPARIN SODIUM (PORCINE) 5000 UNIT/ML IJ SOLN
5000.0000 [IU] | Freq: Three times a day (TID) | INTRAMUSCULAR | Status: DC
  Administered 2018-09-22 – 2018-09-24 (×5): 5000 [IU] via SUBCUTANEOUS
  Filled 2018-09-22 (×6): qty 1

## 2018-09-22 MED ORDER — AMLODIPINE BESYLATE 5 MG PO TABS
5.0000 mg | ORAL_TABLET | Freq: Every day | ORAL | Status: DC
  Administered 2018-09-23 – 2018-09-24 (×2): 5 mg via ORAL
  Filled 2018-09-22 (×2): qty 1

## 2018-09-22 MED ORDER — PANTOPRAZOLE SODIUM 40 MG PO TBEC
40.0000 mg | DELAYED_RELEASE_TABLET | Freq: Every day | ORAL | Status: DC
  Administered 2018-09-23 – 2018-09-24 (×2): 40 mg via ORAL
  Filled 2018-09-22 (×2): qty 1

## 2018-09-22 MED ORDER — TRAZODONE HCL 50 MG PO TABS
50.0000 mg | ORAL_TABLET | Freq: Every day | ORAL | Status: DC
  Administered 2018-09-22 – 2018-09-23 (×2): 50 mg via ORAL
  Filled 2018-09-22 (×2): qty 1

## 2018-09-22 MED ORDER — CARVEDILOL 6.25 MG PO TABS
6.2500 mg | ORAL_TABLET | Freq: Two times a day (BID) | ORAL | Status: DC
  Administered 2018-09-22 – 2018-09-24 (×4): 6.25 mg via ORAL
  Filled 2018-09-22 (×4): qty 1

## 2018-09-22 MED ORDER — CITALOPRAM HYDROBROMIDE 20 MG PO TABS
20.0000 mg | ORAL_TABLET | Freq: Every day | ORAL | Status: DC
  Administered 2018-09-23: 20 mg via ORAL
  Filled 2018-09-22 (×2): qty 1

## 2018-09-22 MED ORDER — ASPIRIN EC 81 MG PO TBEC
81.0000 mg | DELAYED_RELEASE_TABLET | Freq: Every day | ORAL | Status: DC
  Administered 2018-09-23 – 2018-09-24 (×2): 81 mg via ORAL
  Filled 2018-09-22 (×2): qty 1

## 2018-09-22 MED ORDER — SODIUM CHLORIDE 0.9 % IV SOLN
INTRAVENOUS | Status: AC
  Administered 2018-09-22 – 2018-09-23 (×3): via INTRAVENOUS

## 2018-09-22 MED ORDER — INSULIN DETEMIR 100 UNIT/ML ~~LOC~~ SOLN
20.0000 [IU] | Freq: Two times a day (BID) | SUBCUTANEOUS | Status: DC
  Administered 2018-09-22 – 2018-09-23 (×3): 20 [IU] via SUBCUTANEOUS
  Filled 2018-09-22 (×5): qty 0.2

## 2018-09-22 MED ORDER — HYDROCODONE-ACETAMINOPHEN 5-325 MG PO TABS: 1.0000 | ORAL_TABLET | Freq: Four times a day (QID) | ORAL | Status: DC | PRN

## 2018-09-22 MED ORDER — INSULIN ASPART 100 UNIT/ML ~~LOC~~ SOLN
0.0000 [IU] | Freq: Three times a day (TID) | SUBCUTANEOUS | Status: DC
  Administered 2018-09-23: 1 [IU] via SUBCUTANEOUS
  Administered 2018-09-23: 2 [IU] via SUBCUTANEOUS
  Administered 2018-09-24: 9 [IU] via SUBCUTANEOUS

## 2018-09-22 NOTE — Telephone Encounter (Signed)
Go back to ER.  Yesterday, she left without being seen

## 2018-09-22 NOTE — ED Provider Notes (Signed)
[facility] EMERGENCY DEPARTMENT Provider Note   CSN: [id number] Arrival date & time: 09/22/18  1430     History   Chief Complaint Chief Complaint  Patient presents with  . Emesis    HPI Courtney Goodman is a 73 y.o. female with history of CKD, COPD, type 2 diabetes, hypertension, asthma presents today for nausea/vomiting, generalized weakness and confusion.  Patient reports that over the past 2 days she has been feeling unwell, she reports that her primary care provider recently changed her insulin to a new insulin medication called Tyler Aas and she believes she is reacting poorly to this.  She reports that she becomes confused with the dosages and thinks she is overdosing herself on insulin.  She has been checking her blood glucose without hypoglycemia.  She reports that over the past 2 days she has felt nauseous and had multiple episodes of nonbloody/nonbilious emesis.  She reports that she becomes confused easily and has difficulty remembering what she is doing.  Additionally she reports feeling generally unwell, fatigue without focal weakness.  Patient reports that over the past 2 days she will occasionally have "vertigo" where the room would spin slightly and then quickly stop.  Patient denies fever/chills, headache, neck pain/stiffness, fall/injury, chest pain/shortness of breath, cough, abdominal pain, diarrhea, dysuria/hematuria, extremity pain/swelling, focal weakness, numbness/tingling or any additional concerns today.     HPI  Past Medical History:  Diagnosis Date  . Anemia of chronic disease   . Asthma   . Atherosclerotic heart disease of native coronary artery without angina pectoris   . CKD (chronic kidney disease)   . COPD (chronic obstructive pulmonary disease) ([place])   . DM (diabetes mellitus), type 2 with renal complications ([place])   . Dysrhythmia   . Elevated ferritin 01/03/2017  . GAD (generalized anxiety disorder)   . Hypertension   . Mixed  hyperlipidemia due to type 2 diabetes mellitus ([place])   . Mixed incontinence    per medical records from [place]  . Osteopenia   . Personal history of noncompliance with medical treatment, presenting hazards to health   . Shortness of breath dyspnea   . Suicidal ideation   . TB (tuberculosis), treated    age 14  . Vitamin D deficiency     Patient Active Problem List   Diagnosis Date Noted  . Acute renal failure superimposed on stage 3 chronic kidney disease ([place]) 09/22/2018  . GERD (gastroesophageal reflux disease) 08/23/2017  . CAD (coronary artery disease) 08/23/2017  . Hypoglycemia 08/23/2017  . Acute metabolic encephalopathy 53/66/4403  . Hypokalemia 08/23/2017  . Type II diabetes mellitus with renal manifestations ([place]) 08/23/2017  . Elevated ferritin 01/03/2017  . Personal history of noncompliance with medical treatment, presenting hazards to health   . Urinary incontinence, mixed 05/27/2016  . Major depression, recurrent ([place]) 05/27/2016  . Atherosclerotic heart disease of native coronary artery without angina pectoris 05/27/2016  . GAD (generalized anxiety disorder)   . Anemia of chronic disease   . Mixed hyperlipidemia due to type 2 diabetes mellitus ([place])   . Diabetes ([place]) 11/30/2015  . Vitamin D deficiency 11/23/2015  . HTN (hypertension) 11/23/2015  . Anemia 11/23/2015  . CKD (chronic kidney disease), stage III ([place]) 11/23/2015  . Dysrhythmia   . COPD (chronic obstructive pulmonary disease) ([place])     Past Surgical History:  Procedure Laterality Date  . ANKLE RECONSTRUCTION     right  . CARPAL TUNNEL RELEASE    . CORONARY ANGIOPLASTY  OB History   No obstetric history on file.      Home Medications    Prior to Admission medications   Medication Sig Start Date End Date Taking? Authorizing Provider  albuterol (PROVENTIL HFA;VENTOLIN HFA) 108 (90 Base) MCG/ACT inhaler Inhale 2 puffs into the lungs every 6 (six) hours as needed for wheezing or  shortness of breath. 04/14/17   [name], NP-C  Alcohol Swabs (ALCOHOL PREP) PADS Use for testing 08/07/18   Henson, Vickie L, NP-C  amLODipine-benazepril (LOTREL) 5-20 MG capsule Take 1 capsule by mouth daily. 08/07/18   Henson, Vickie L, NP-C  aspirin EC 81 MG tablet Take 81 mg by mouth daily.     [provider]  Biotin 10000 MCG TABS Take 1 tablet by mouth daily.     [provider]  carvedilol (COREG) 6.25 MG tablet Take 1 tablet by mouth twice daily. 08/24/18   Henson, Vickie L, NP-C  cholecalciferol (VITAMIN D) 25 MCG (1000 UT) tablet Take 1 tablet by mouth daily. 08/07/18   Henson, Vickie L, NP-C  citalopram (CELEXA) 20 MG tablet Take 1 tablet (20 mg total) by mouth daily. 09/05/16   Henson, Vickie L, NP-C  fenofibrate (TRICOR) 145 MG tablet Take 1 tablet (145 mg total) by mouth daily. 08/24/18   Henson, Vickie L, NP-C  fluticasone (FLONASE) 50 MCG/ACT nasal spray Place 2 sprays into both nostrils daily. 07/25/16   Henson, Vickie L, NP-C  folic acid (FOLVITE) 1 MG tablet Take 1 tablet (1 mg total) by mouth daily. 03/30/18   Henson, Vickie L, NP-C  glucose blood (TRUE METRIX BLOOD GLUCOSE TEST) test strip Use to monitor glucose levels twice per day 03/23/18   Renato Shin, MD  Insulin Degludec (TRESIBA FLEXTOUCH) 200 UNIT/ML SOPN Inject 100 Units into the skin daily. 09/16/18   Renato Shin, MD  Insulin Pen Needle 31G X 5 MM MISC Inject 1 each into the skin 2 (two) times daily. Use to inject insulin twice daily; E11.29 09/14/18   Renato Shin, MD  liraglutide (VICTOZA) 18 MG/3ML SOPN Inject 1.8 mg subcutaneously daily. 05/27/18   Renato Shin, MD  omeprazole (PRILOSEC) 40 MG capsule Take 1 capsule (40 mg total) by mouth daily. 08/25/18   Henson, Vickie L, NP-C  simvastatin (ZOCOR) 20 MG tablet Take 1 tablet by mouth every night. 08/24/18   Henson, Vickie L, NP-C  traMADol (ULTRAM) 50 MG tablet Take 1 tablet by mouth daily as needed. Patient taking differently: Take 1 tablet by  mouth daily as needed. for pain 04/03/17   Henson, Vickie L, NP-C  traZODone (DESYREL) 50 MG tablet Take 50 mg by mouth at bedtime. Patient gets this from her therapist, not PCP    [provider]  triamcinolone (KENALOG) 0.025 % ointment Apply 1 application topically 2 (two) times daily. 06/30/18   Marzetta Board, DPM    Family History Family History  Problem Relation Age of Onset  . Diabetes Mother     Social History Social History   Tobacco Use  . Smoking status: Former Research scientist (life sciences)  . Smokeless tobacco: Never Used  Substance Use Topics  . Alcohol use: No  . Drug use: No     Allergies   Patient has no known allergies.   Review of Systems Review of Systems Ten systems are reviewed and are negative for acute change except as noted in the HPI  Physical Exam Updated Vital Signs BP (!) 144/99   Pulse 72   Temp 98.4 F (  36.9 C) (Oral)   Resp 18   SpO2 98%   Physical Exam Constitutional:      General: She is not in acute distress.    Appearance: Normal appearance. She is well-developed. She is not ill-appearing or diaphoretic.  HENT:     Head: Normocephalic and atraumatic.     Right Ear: External ear normal.     Left Ear: External ear normal.     Nose: Nose normal.  Eyes:     General: Vision grossly intact. Gaze aligned appropriately.     Pupils: Pupils are equal, round, and reactive to light.  Neck:     Musculoskeletal: Normal range of motion.     Trachea: Trachea and phonation normal. No tracheal deviation.  Pulmonary:     Effort: Pulmonary effort is normal. No respiratory distress.  Abdominal:     General: There is no distension.     Palpations: Abdomen is soft.     Tenderness: There is no abdominal tenderness. There is no guarding or rebound.  Musculoskeletal: Normal range of motion.  Skin:    General: Skin is warm and dry.  Neurological:     Mental Status: She is alert.     GCS: GCS eye subscore is 4. GCS verbal subscore is 5. GCS motor subscore  is 6.     Comments: Mental Status: Alert, oriented, thought content appropriate, able to give a coherent history. Speech fluent without evidence of aphasia. Able to follow 2 step commands without difficulty. Cranial Nerves: II: Peripheral visual fields grossly normal, pupils equal, round, reactive to light III,IV, VI: ptosis not present, extra-ocular motions intact bilaterally V,VII: smile symmetric, eyebrows raise symmetric, facial light touch sensation equal VIII: hearing grossly normal to voice X: uvula elevates symmetrically XI: bilateral shoulder shrug symmetric and strong XII: midline tongue extension without fassiculations Motor: Normal tone. 5/5 strength in upper and lower extremities bilaterally including strong and equal grip strength and dorsiflexion/plantar flexion Sensory: Sensation intact to light touch in all extremities.Negative Romberg.  Cerebellar: normal finger-to-nose with bilateral upper extremities. Normal heel-to -shin balance bilaterally of the lower extremity. No pronator drift.  Gait: normal gait and balance CV: distal pulses palpable throughout  Psychiatric:        Behavior: Behavior normal.    ED Treatments / Results  Labs (all labs ordered are listed, but only abnormal results are displayed) Labs Reviewed  CBC WITH DIFFERENTIAL/PLATELET - Abnormal; Notable for the following components:      Result Value   WBC 15.4 (*)    RDW 15.7 (*)    Platelets 401 (*)    Neutro Abs 10.4 (*)    Monocytes Absolute 1.1 (*)    Abs Immature Granulocytes 0.08 (*)    All other components within normal limits  COMPREHENSIVE METABOLIC PANEL - Abnormal; Notable for the following components:   Sodium 134 (*)    Chloride 97 (*)    Glucose, Bld 175 (*)    BUN 48 (*)    Creatinine, Ser 3.48 (*)    Calcium 10.4 (*)    Total Protein 8.9 (*)    GFR calc non Af Amer 12 (*)    GFR calc Af Amer 14 (*)    All other components within normal limits  URINALYSIS, ROUTINE W  REFLEX MICROSCOPIC - Abnormal; Notable for the following components:   APPearance HAZY (*)    Specific Gravity, Urine >1.030 (*)    Glucose, UA 100 (*)    Hgb urine dipstick TRACE (*)  Bilirubin Urine SMALL (*)    Ketones, ur 15 (*)    Protein, ur 100 (*)    Leukocytes,Ua MODERATE (*)    All other components within normal limits  URINALYSIS, MICROSCOPIC (REFLEX) - Abnormal; Notable for the following components:   Bacteria, UA FEW (*)    All other components within normal limits  CBG MONITORING, ED - Abnormal; Notable for the following components:   Glucose-Capillary 186 (*)    All other components within normal limits  SARS CORONAVIRUS 2 (TAT 6-12 HRS)  LIPASE, BLOOD    EKG None  Radiology No results found.  Procedures Procedures (including critical care time)  Medications Ordered in ED Medications  sodium chloride 0.9 % bolus 500 mL (has no administration in time range)  ondansetron (ZOFRAN) injection 4 mg (4 mg Intravenous Given 09/22/18 2055)  sodium chloride 0.9 % bolus 500 mL (500 mLs Intravenous New Bag/Given 09/22/18 2055)     Initial Impression / Assessment and Plan / ED Course  I have reviewed the triage vital signs and the nursing notes.  Pertinent labs & imaging results that were available during my care of the patient were reviewed by me and considered in my medical decision making (see chart for details).     CBC with new leukocytosis of 15.4 with left shift, patient without fever or pain, low suspicion for infection at this time CMP shows creatinine of 3.48, increased BUN as well, peers is new AKI suspect from patient's nausea vomiting and decreased p.o. intake she has been given fluids here in the ER we will seek admission for AKI Lipase within normal limits CBG 186 Urinalysis with ketones, protein, glucose and bilirubin present, nitrite negative, moderate leukocytes however there are 11-20 squamous cells, low suspicion for infection at this time as  patient without urinary symptoms, suspect is contaminated catch. - Patient overall fatigued but no acute distress, heart regular rate and rhythm without murmur, lungs clear to auscultation bilaterally, abdomen soft nontender without peritoneal signs, neurovascular intact to all 4 extremities without sign of DVT, no neuro deficits on examination. - Patient seen and evaluated by Dr. Eulis Foster during this visit, we will seek admission for AKI at this time.  Suspect secondary to new Tresiba medication and decreased p.o. intake with nausea and vomiting.  Do not suspect infection at this time, no indication for imaging. - Discussed case with hospitalist Dr. Myna Hidalgo will be seeing patient for admission. - Patient reevaluated tired appearing but in no acute distress.  She is agreeable for admission.   Note: Portions of this report may have been transcribed using voice recognition software. Every effort was made to ensure accuracy; however, inadvertent computerized transcription errors may still be present. Final Clinical Impressions(s) / ED Diagnoses   Final diagnoses:  AKI (acute kidney injury) ([place])  Non-intractable vomiting with nausea, unspecified vomiting type    ED Discharge Orders    None       Gari [facility] 09/22/18 2106    Daleen Bo, MD 09/24/18 1744

## 2018-09-22 NOTE — Telephone Encounter (Signed)
Please advise if you would like pt to have VV today

## 2018-09-22 NOTE — ED Notes (Signed)
Pt took her CBG on personal monitor. Reading was 204.

## 2018-09-22 NOTE — ED Triage Notes (Signed)
Pt reports having n/v that started yesterday. Pt denies any abd pain or diarrhea. Pt had blood work drawn early this morning at this ER but was unable to wait to see seen.

## 2018-09-22 NOTE — H&P (Signed)
History and Physical    Courtney Goodman FUX:[id number] DOB: 1946-01-13 DOA: 09/22/2018  PCP: [name], NP-C   Patient coming from: Home   Chief Complaint: N/V   HPI: Courtney Goodman is a 73 y.o. female with medical history significant for insulin-dependent diabetes mellitus, asthma or COPD, chronic kidney disease stage III, coronary artery disease, and depression with anxiety, now presenting to the emergency department for evaluation of nausea and vomiting.  Patient reports that she developed nausea with recurrent nonbloody vomiting on 09/21/2018, denies any associated fevers, chills, abdominal pain, or diarrhea.  Patient denies any travel or sick contacts and believes that her symptoms are secondary to recent changes in her medications.  Specifically, the patient reports that her insulin was recently changed to [place] and she believes her symptoms are secondary to this.  She thought that she was hypoglycemic when she developed the symptoms, but has been checking her blood sugar which has been in the low 200s mainly, and there were no low readings.  She denies any chest pain, cough, or shortness of breath.  ED Course: Upon arrival to the ED, patient is found to be afebrile, saturating well on room air, and with remaining vitals also normal.  Chemistry panel is notable for creatinine of 3.48, up from 1.79 less than 24 hours earlier.  CBC is notable for leukocytosis to 15,400 and a mild thrombocytosis.  Urinalysis reveals elevated specific gravity, proteinuria, and ketonuria.  Patient was given a liter of normal saline in the ED, COVID-19 screening test is pending, and hospitalists are consulted for admission.  Review of Systems:  All other systems reviewed and apart from HPI, are negative.  Past Medical History:  Diagnosis Date  . Anemia of chronic disease   . Asthma   . Atherosclerotic heart disease of native coronary artery without angina pectoris   . CKD (chronic kidney disease)   .  COPD (chronic obstructive pulmonary disease) ([place])   . DM (diabetes mellitus), type 2 with renal complications ([place])   . Dysrhythmia   . Elevated ferritin 01/03/2017  . GAD (generalized anxiety disorder)   . Hypertension   . Mixed hyperlipidemia due to type 2 diabetes mellitus ([place])   . Mixed incontinence    per medical records from [facility]  . Osteopenia   . Personal history of noncompliance with medical treatment, presenting hazards to health   . Shortness of breath dyspnea   . Suicidal ideation   . TB (tuberculosis), treated    age 73  . Vitamin D deficiency     Past Surgical History:  Procedure Laterality Date  . ANKLE RECONSTRUCTION     right  . CARPAL TUNNEL RELEASE    . CORONARY ANGIOPLASTY       reports that she has quit smoking. She has never used smokeless tobacco. She reports that she does not drink alcohol or use drugs.  No Known Allergies  Family History  Problem Relation Age of Onset  . Diabetes Mother      Prior to Admission medications   Medication Sig Start Date End Date Taking? Authorizing Provider  albuterol (PROVENTIL HFA;VENTOLIN HFA) 108 (90 Base) MCG/ACT inhaler Inhale 2 puffs into the lungs every 6 (six) hours as needed for wheezing or shortness of breath. 04/14/17  Yes Henson, Vickie L, NP-C  amLODipine-benazepril (LOTREL) 5-20 MG capsule Take 1 capsule by mouth daily. 08/07/18  Yes Henson, Vickie L, NP-C  aspirin EC 81 MG tablet Take 81 mg by mouth daily.  Yes [provider]  Biotin 10000 MCG TABS Take 1 tablet by mouth daily.    Yes [provider]  carvedilol (COREG) 6.25 MG tablet Take 1 tablet by mouth twice daily. 08/24/18  Yes Henson, Vickie L, NP-C  cholecalciferol (VITAMIN D) 25 MCG (1000 UT) tablet Take 1 tablet by mouth daily. 08/07/18  Yes Henson, Vickie L, NP-C  citalopram (CELEXA) 20 MG tablet Take 1 tablet (20 mg total) by mouth daily. 09/05/16  Yes Henson, Vickie L, NP-C  fenofibrate (TRICOR) 145 MG tablet Take 1 tablet  (145 mg total) by mouth daily. 08/24/18  Yes Henson, Vickie L, NP-C  fluticasone (FLONASE) 50 MCG/ACT nasal spray Place 2 sprays into both nostrils daily. 07/25/16  Yes Henson, Vickie L, NP-C  folic acid (FOLVITE) 1 MG tablet Take 1 tablet (1 mg total) by mouth daily. 03/30/18  Yes Henson, Vickie L, NP-C  Insulin Degludec (TRESIBA FLEXTOUCH) 200 UNIT/ML SOPN Inject 100 Units into the skin daily. 09/16/18  Yes Renato Shin, MD  liraglutide (VICTOZA) 18 MG/3ML SOPN Inject 1.8 mg into the skin daily.   Yes [provider]  omeprazole (PRILOSEC) 40 MG capsule Take 1 capsule (40 mg total) by mouth daily. 08/25/18  Yes Henson, Vickie L, NP-C  simvastatin (ZOCOR) 20 MG tablet Take 1 tablet by mouth every night. 08/24/18  Yes Henson, Vickie L, NP-C  traMADol (ULTRAM) 50 MG tablet Take 1 tablet by mouth daily as needed. 04/03/17  Yes Henson, Vickie L, NP-C  traZODone (DESYREL) 50 MG tablet Take 50 mg by mouth at bedtime. Patient gets this from her therapist, not PCP   Yes [provider]  Alcohol Swabs (ALCOHOL PREP) PADS Use for testing 08/07/18   Raenette Rover, Vickie L, NP-C  glucose blood (TRUE METRIX BLOOD GLUCOSE TEST) test strip Use to monitor glucose levels twice per day 03/23/18   Renato Shin, MD  Insulin Pen Needle 31G X 5 MM MISC Inject 1 each into the skin 2 (two) times daily. Use to inject insulin twice daily; E11.29 09/14/18   Renato Shin, MD    Physical Exam: Vitals:   09/22/18 1456 09/22/18 1721 09/22/18 1928 09/22/18 2054  BP: 117/83 93/70 (!) 160/96 (!) 144/99  Pulse: 81 73 74 72  Resp: 16 16 18 18   Temp: 98.4 F (36.9 C)     TempSrc: Oral     SpO2: 100% 100% 99% 98%    Constitutional: NAD, calm   Eyes: PERTLA, lids and conjunctivae normal ENMT: Mucous membranes are moist. Posterior pharynx clear of any exudate or lesions.   Neck: normal, supple, no masses, no thyromegaly Respiratory: no wheezing, no crackles. Normal respiratory effort. No accessory muscle use.   Cardiovascular: S1 & S2 heard, regular rate and rhythm. No extremity edema.   Abdomen: No tenderness, soft. Bowel sounds active.  Musculoskeletal: no clubbing / cyanosis. No joint deformity upper and lower extremities.    Skin: no significant rashes, lesions, ulcers. Poor turgor. Neurologic: No facial asymmetry. Sensation intact. Moving all extremities.  Psychiatric: Alert and oriented to person, place, and situation. Very pleasant, cooperative.    Labs on Admission: I have personally reviewed following labs and imaging studies  CBC: Recent Labs  Lab 09/21/18 2330 09/22/18 1945  WBC 9.0 15.4*  NEUTROABS  --  10.4*  HGB 12.6 12.8  HCT 39.0 39.8  MCV 89.7 92.3  PLT 452* 675*   Basic Metabolic Panel: Recent Labs  Lab 09/21/18 2330 09/22/18 1945  NA 139 134*  K 3.9  3.7  CL 100 97*  CO2 24 25  GLUCOSE 180* 175*  BUN 30* 48*  CREATININE 1.79* 3.48*  CALCIUM 10.9* 10.4*   GFR: Estimated Creatinine Clearance: 14.4 mL/min (A) (by C-G formula based on SCr of 3.48 mg/dL (H)). Liver Function Tests: Recent Labs  Lab 09/21/18 2330 09/22/18 1945  AST 19 17  ALT 12 13  ALKPHOS 55 55  BILITOT 0.8 1.0  PROT 8.7* 8.9*  ALBUMIN 4.0 4.2   Recent Labs  Lab 09/21/18 2330 09/22/18 1945  LIPASE 25 29   No results for input(s): AMMONIA in the last 168 hours. Coagulation Profile: No results for input(s): INR, PROTIME in the last 168 hours. Cardiac Enzymes: No results for input(s): CKTOTAL, CKMB, CKMBINDEX, TROPONINI in the last 168 hours. BNP (last 3 results) No results for input(s): PROBNP in the last 8760 hours. HbA1C: No results for input(s): HGBA1C in the last 72 hours. CBG: Recent Labs  Lab 09/22/18 1954  GLUCAP 186*   Lipid Profile: No results for input(s): CHOL, HDL, LDLCALC, TRIG, CHOLHDL, LDLDIRECT in the last 72 hours. Thyroid Function Tests: No results for input(s): TSH, T4TOTAL, FREET4, T3FREE, THYROIDAB in the last 72 hours. Anemia Panel: No results for  input(s): VITAMINB12, FOLATE, FERRITIN, TIBC, IRON, RETICCTPCT in the last 72 hours. Urine analysis:    Component Value Date/Time   COLORURINE YELLOW 09/22/2018 1940   APPEARANCEUR HAZY (A) 09/22/2018 1940   LABSPEC >1.030 (H) 09/22/2018 1940   PHURINE 5.0 09/22/2018 1940   GLUCOSEU 100 (A) 09/22/2018 1940   HGBUR TRACE (A) 09/22/2018 1940   BILIRUBINUR SMALL (A) 09/22/2018 1940   BILIRUBINUR n 07/01/2016 1139   KETONESUR 15 (A) 09/22/2018 1940   PROTEINUR 100 (A) 09/22/2018 1940   UROBILINOGEN negative (A) 07/01/2016 1139   NITRITE NEGATIVE 09/22/2018 1940   LEUKOCYTESUR MODERATE (A) 09/22/2018 1940   Sepsis Labs: @LABRCNTIP (procalcitonin:4,lacticidven:4) )No results found for this or any previous visit (from the past 240 hour(s)).   Radiological Exams on Admission: No results found.  EKG: Not performed.   Assessment/Plan   1. Acute kidney injury superimposed on CKD stage III  - Presents with 2 days of N/V and is found to have SCr of 3.48, up from apparent baseline of 1.6 and was 1.79 <24 hrs earlier  - Likely acute prerenal azotemia in setting of N/V  - She was given a liter of NS in ED  - Check urine chemistries, renally-dose medications, continue IVF hydration, hold ACE-i, and repeat chem panel in am    2. Nausea with vomiting  - Patient reports severe nausea and recurrent non-bloody vomiting without abdominal pain, fever, or diarrhea that developed 8/24  - Abdominal exam benign, lipase and LFT's wnl, and patient feels that this is resolving and wants to eat  - Patient believes this is secondary to starting [place] given temporal relationship  - Continue IVF hydration, as-needed antiemetics, and monitor renal function and electrolytes    3. Insulin-dependent DM  - A1c was 9.8% in August 2020  - She has recently had insulin changed to [place] and believes that is causing her N/V  - Check CBG's, use Levemir and Novolog correctional for now    4. Hypertension  - BP at  goal  - Continue Coreg and amlodipine  - Hold ACE-i in light of AKI     5. CAD  - No anginal complaints  - Continue aspirin and statin   6. Depression, anxiety  - Continue Celexa and trazodone   7.  Leukocytosis  - WBC is 15,400 in ED  - She could have acute viral GE but there is no fever or evidence for pulmonary or urinary infection  - Culture if febrile, repeat CBC in am   8. Asthma  - No SOB, cough, or wheezing on admission  - Continue as-needed albuterol    PPE: Mask, face shield. Patient wearing mask.  DVT prophylaxis: sq heparin  Code Status: Full  Family Communication: Discussed with patient  Consults called: none  Admission status: Inpatient. Patient has acute renal failure with a near-doubling of creatinine in <24 hours, and it is not reasonable to expect that this can be safely addressed within timeframe of observation, particularly consider patient age and comorbidity which put her at increased risk of complications.    Vianne Bulls, MD [facility] Pager [phone]  If 7PM-7AM, please contact night-coverage [email or web address] Password [facility]  09/22/2018, 9:58 PM

## 2018-09-22 NOTE — Telephone Encounter (Signed)
Called pt. Advised a review of ED notes was completed. Appears she left without being seen by a provider for a full work up to explain her symptoms and to receive proper advise about how to treat/care for her condition. While we are able to see labs were obtained, a diagnosis nor POC was not completed to provider proper advice to her concerns today. Advised she return to ED for further work up to her concerns. Verbalized acceptance and understanding.

## 2018-09-22 NOTE — ED Notes (Signed)
Patients daughter advised that patient is too tired to keep waiting to be seen by a provider, this RN encouraged pts daughter to have patient stay and be seen but she stated her mother wanted to leave.

## 2018-09-22 NOTE — ED Provider Notes (Signed)
  Face-to-face evaluation   History: She presents for evaluation of vomiting and confusion.  She thinks that her blood sugar is low since starting on Tresiba, 2 days ago.  She denies abdominal or back pain.  There is been no dysuria or urinary frequency.  She did not eat anything today because she felt so bad.   Physical exam: Elderly, alert, she appears uncomfortable.  Abdomen soft and nontender.  Back nontender to palpation.  No respiratory distress.  No dysarthria or aphasia.  Medical screening examination/treatment/procedure(s) were conducted as a shared visit with non-physician practitioner(s) and myself.  I personally evaluated the patient during the encounter    Daleen Bo, MD 09/24/18 1744

## 2018-09-22 NOTE — Telephone Encounter (Signed)
Patient is having extreme vomiting when taking 100 units of Tresiba.  Was in the ER yesterday and is still experiencing problems today. Wants to know if she should return to ER or do something different with medication etc?

## 2018-09-22 NOTE — ED Notes (Signed)
ED TO INPATIENT HANDOFF REPORT  ED Nurse Name and Phone #: William Hamburger, RN [phone]  S Name/Age/Gender Courtney Goodman 73 y.o. female Room/Bed: 043C/043C  Code Status   Code Status: Prior  Home/SNF/Other Home Patient oriented to: self, place and time Is this baseline? No   Triage Complete: Triage complete  Chief Complaint sick  Triage Note Pt reports having n/v that started yesterday. Pt denies any abd pain or diarrhea. Pt had blood work drawn early this morning at this ER but was unable to wait to see seen.    Allergies No Known Allergies  Level of Care/Admitting Diagnosis ED Disposition    ED Disposition Condition Comment   Admit  Hospital Area: [facility] [[id number]]  Level of Care: Med-Surg [16]  Covid Evaluation: Asymptomatic Screening Protocol (No Symptoms)  Diagnosis: Acute renal failure superimposed on stage 3 chronic kidney disease [facility]) [[id number]]  Admitting Physician: Vianne Bulls [[id number]]  Attending Physician: Vianne Bulls [[id number]]  Estimated length of stay: past midnight tomorrow  Certification:: I certify this patient will need inpatient services for at least 2 midnights  PT Class (Do Not Modify): Inpatient [101]  PT Acc Code (Do Not Modify): Private [1]       B Medical/Surgery History Past Medical History:  Diagnosis Date  . Anemia of chronic disease   . Asthma   . Atherosclerotic heart disease of native coronary artery without angina pectoris   . CKD (chronic kidney disease)   . COPD (chronic obstructive pulmonary disease) ([place])   . DM (diabetes mellitus), type 2 with renal complications ([place])   . Dysrhythmia   . Elevated ferritin 01/03/2017  . GAD (generalized anxiety disorder)   . Hypertension   . Mixed hyperlipidemia due to type 2 diabetes mellitus ([place])   . Mixed incontinence    per medical records from [place]  . Osteopenia   . Personal history of noncompliance with medical treatment, presenting hazards to health   .  Shortness of breath dyspnea   . Suicidal ideation   . TB (tuberculosis), treated    age 28  . Vitamin D deficiency    Past Surgical History:  Procedure Laterality Date  . ANKLE RECONSTRUCTION     right  . CARPAL TUNNEL RELEASE    . CORONARY ANGIOPLASTY       A IV Location/Drains/Wounds Patient Lines/Drains/Airways Status   Active Line/Drains/Airways    Name:   Placement date:   Placement time:   Site:   Days:   Peripheral IV 09/22/18 Right Antecubital   09/22/18    2000    Antecubital   less than 1          Intake/Output Last 24 hours No intake or output data in the 24 hours ending 09/22/18 2154  Labs/Imaging Results for orders placed or performed during the hospital encounter of 09/22/18 (from the past 48 hour(s))  Urinalysis, Routine w reflex microscopic     Status: Abnormal   Collection Time: 09/22/18  7:40 PM  Result Value Ref Range   Color, Urine YELLOW YELLOW   APPearance HAZY (A) CLEAR   Specific Gravity, Urine >1.030 (H) 1.005 - 1.030   pH 5.0 5.0 - 8.0   Glucose, UA 100 (A) NEGATIVE mg/dL   Hgb urine dipstick TRACE (A) NEGATIVE   Bilirubin Urine SMALL (A) NEGATIVE   Ketones, ur 15 (A) NEGATIVE mg/dL   Protein, ur 100 (A) NEGATIVE mg/dL   Nitrite NEGATIVE NEGATIVE   Leukocytes,Ua MODERATE (A)  NEGATIVE    Comment: Performed at [facility], [place] [address] [place], [place] [address]  Urinalysis, Microscopic (reflex)     Status: Abnormal   Collection Time: 09/22/18  7:40 PM  Result Value Ref Range   RBC / HPF 0-5 0 - 5 RBC/hpf   WBC, UA 6-10 0 - 5 WBC/hpf   Bacteria, UA FEW (A) NONE SEEN   Squamous Epithelial / LPF 11-20 0 - 5   Urine-Other MICROSCOPIC EXAM PERFORMED ON UNCONCENTRATED URINE     Comment: Performed at [facility], [place] [address] [place], [place] [address]  CBC with Differential     Status: Abnormal   Collection Time: 09/22/18  7:45 PM  Result Value Ref Range   WBC 15.4 (H) 4.0 - 10.5 K/uL   RBC 4.31 3.87 - 5.11 MIL/uL    Hemoglobin 12.8 12.0 - 15.0 g/dL   HCT 39.8 36.0 - 46.0 %   MCV 92.3 80.0 - 100.0 fL   MCH 29.7 26.0 - 34.0 pg   MCHC 32.2 30.0 - 36.0 g/dL   RDW 15.7 (H) 11.5 - 15.5 %   Platelets 401 (H) 150 - 400 K/uL   nRBC 0.0 0.0 - 0.2 %   Neutrophils Relative % 67 %   Neutro Abs 10.4 (H) 1.7 - 7.7 K/uL   Lymphocytes Relative 24 %   Lymphs Abs 3.7 0.7 - 4.0 K/uL   Monocytes Relative 7 %   Monocytes Absolute 1.1 (H) 0.1 - 1.0 K/uL   Eosinophils Relative 0 %   Eosinophils Absolute 0.1 0.0 - 0.5 K/uL   Basophils Relative 1 %   Basophils Absolute 0.1 0.0 - 0.1 K/uL   Immature Granulocytes 1 %   Abs Immature Granulocytes 0.08 (H) 0.00 - 0.07 K/uL    Comment: Performed at [facility] [address], [place], [place] [address]  Comprehensive metabolic panel     Status: Abnormal   Collection Time: 09/22/18  7:45 PM  Result Value Ref Range   Sodium 134 (L) 135 - 145 mmol/L   Potassium 3.7 3.5 - 5.1 mmol/L   Chloride 97 (L) 98 - 111 mmol/L   CO2 25 22 - 32 mmol/L   Glucose, Bld 175 (H) 70 - 99 mg/dL   BUN 48 (H) 8 - 23 mg/dL   Creatinine, Ser 3.48 (H) 0.44 - 1.00 mg/dL    Comment: DELTA CHECK NOTED   Calcium 10.4 (H) 8.9 - 10.3 mg/dL   Total Protein 8.9 (H) 6.5 - 8.1 g/dL   Albumin 4.2 3.5 - 5.0 g/dL   AST 17 15 - 41 U/L   ALT 13 0 - 44 U/L   Alkaline Phosphatase 55 38 - 126 U/L   Total Bilirubin 1.0 0.3 - 1.2 mg/dL   GFR calc non Af Amer 12 (L) >60 mL/min   GFR calc Af Amer 14 (L) >60 mL/min   Anion gap 12 5 - 15    Comment: Performed at [facility], [place] [address], [place],  [address]  Lipase, blood     Status: None   Collection Time: 09/22/18  7:45 PM  Result Value Ref Range   Lipase 29 11 - 51 U/L    Comment: Performed at [facility] [address], [place],  [address]  POC CBG, ED     Status: Abnormal   Collection Time: 09/22/18  7:54 PM  Result Value Ref Range   Glucose-Capillary 186 (H) 70 -  99 mg/dL   No results found.  Pending  Labs Unresulted Labs (From admission, onward)    Start     Ordered   09/22/18 2049  SARS CORONAVIRUS 2 (TAT 6-12 HRS) Nasal Swab Aptima Multi Swab  (Asymptomatic/Tier 2 Patients Labs)  Once,   STAT    Question Answer Comment  Is this test for diagnosis or screening Screening   Symptomatic for COVID-19 as defined by CDC No   Hospitalized for COVID-19 No   Admitted to ICU for COVID-19 No   Previously tested for COVID-19 No   Resident in a congregate (group) care setting No   Employed in healthcare setting No   Pregnant No      09/22/18 2048          Vitals/Pain Today's Vitals   09/22/18 1502 09/22/18 1721 09/22/18 1928 09/22/18 2054  BP:  93/70 (!) 160/96 (!) 144/99  Pulse:  73 74 72  Resp:  16 18 18   Temp:      TempSrc:      SpO2:  100% 99% 98%  PainSc: 0-No pain       Isolation Precautions No active isolations  Medications Medications  sodium chloride 0.9 % bolus 500 mL (500 mLs Intravenous New Bag/Given 09/22/18 2151)  ondansetron (ZOFRAN) injection 4 mg (4 mg Intravenous Given 09/22/18 2055)  sodium chloride 0.9 % bolus 500 mL (500 mLs Intravenous New Bag/Given 09/22/18 2055)    Mobility walks Low fall risk   Focused Assessments Renal Assessment Handoff:  Hemodialysis Schedule:  Last Hemodialysis date and time:    Restricted appendage:      R Recommendations: See Admitting Provider Note  Report given to:   Additional Notes:  Acute Kidney Injury.  20 g LAC; Zofran 4 mg + NS 1L bolus.

## 2018-09-23 LAB — CBC WITH DIFFERENTIAL/PLATELET
Abs Immature Granulocytes: 0.04 10*3/uL (ref 0.00–0.07)
Basophils Absolute: 0.1 10*3/uL (ref 0.0–0.1)
Basophils Relative: 1 %
Eosinophils Absolute: 0.1 10*3/uL (ref 0.0–0.5)
Eosinophils Relative: 1 %
HCT: 33.1 % — ABNORMAL LOW (ref 36.0–46.0)
Hemoglobin: 10.8 g/dL — ABNORMAL LOW (ref 12.0–15.0)
Immature Granulocytes: 0 %
Lymphocytes Relative: 31 %
Lymphs Abs: 3.1 10*3/uL (ref 0.7–4.0)
MCH: 30 pg (ref 26.0–34.0)
MCHC: 32.6 g/dL (ref 30.0–36.0)
MCV: 91.9 fL (ref 80.0–100.0)
Monocytes Absolute: 0.8 10*3/uL (ref 0.1–1.0)
Monocytes Relative: 8 %
Neutro Abs: 5.7 10*3/uL (ref 1.7–7.7)
Neutrophils Relative %: 59 %
Platelets: 317 10*3/uL (ref 150–400)
RBC: 3.6 MIL/uL — ABNORMAL LOW (ref 3.87–5.11)
RDW: 15.7 % — ABNORMAL HIGH (ref 11.5–15.5)
WBC: 9.8 10*3/uL (ref 4.0–10.5)
nRBC: 0 % (ref 0.0–0.2)

## 2018-09-23 LAB — BASIC METABOLIC PANEL
Anion gap: 9 (ref 5–15)
BUN: 48 mg/dL — ABNORMAL HIGH (ref 8–23)
CO2: 21 mmol/L — ABNORMAL LOW (ref 22–32)
Calcium: 9.1 mg/dL (ref 8.9–10.3)
Chloride: 107 mmol/L (ref 98–111)
Creatinine, Ser: 2.99 mg/dL — ABNORMAL HIGH (ref 0.44–1.00)
GFR calc Af Amer: 17 mL/min — ABNORMAL LOW (ref >60)
GFR calc non Af Amer: 15 mL/min — ABNORMAL LOW (ref >60)
Glucose, Bld: 147 mg/dL — ABNORMAL HIGH (ref 70–99)
Potassium: 3.6 mmol/L (ref 3.5–5.1)
Sodium: 137 mmol/L (ref 135–145)

## 2018-09-23 LAB — GLUCOSE, CAPILLARY
Glucose-Capillary: 127 mg/dL — ABNORMAL HIGH (ref 70–99)
Glucose-Capillary: 133 mg/dL — ABNORMAL HIGH (ref 70–99)
Glucose-Capillary: 72 mg/dL (ref 70–99)
Glucose-Capillary: 189 mg/dL — ABNORMAL HIGH (ref 70–99)
Glucose-Capillary: 261 mg/dL — ABNORMAL HIGH (ref 70–99)

## 2018-09-23 LAB — SARS CORONAVIRUS 2 (TAT 6-24 HRS): SARS Coronavirus 2: NEGATIVE

## 2018-09-23 MED ORDER — ADULT MULTIVITAMIN W/MINERALS CH
1.0000 | ORAL_TABLET | Freq: Every day | ORAL | Status: DC
  Administered 2018-09-23 – 2018-09-24 (×2): 1 via ORAL
  Filled 2018-09-23 (×2): qty 1

## 2018-09-23 MED ORDER — SODIUM CHLORIDE 0.9 % IV SOLN
INTRAVENOUS | Status: DC
  Administered 2018-09-23 (×2): via INTRAVENOUS

## 2018-09-23 MED ORDER — GLUCERNA SHAKE PO LIQD
237.0000 mL | Freq: Two times a day (BID) | ORAL | Status: DC
  Administered 2018-09-23 – 2018-09-24 (×2): 237 mL via ORAL

## 2018-09-23 NOTE — Discharge Instructions (Signed)
Endocrinologists [facility] [phone]) 1. Dr. [name] 2. Dr. Janie Morning [facility] [phone]) 1. Dr. Delrae Rend [facility] [phone]) 1. Dr. Jacelyn Pi 2. Dr. Anda Kraft [facility] [phone]) 1. Dr. Daneil Dolin [facility] [phone]) [[place] office]  [phone]) [[place] office] 1. Dr. Lenna Sciara [place] 2. Dr. Judithann Sheen [facility]) [phone]) 1. Autumn Hudnall Ronnald Ramp), PA 2. Dr. Amalia Greenhouse 3. Dr. Marsh Dolly. [facility] [phone]) 1. Dr. Glade Lloyd [facility] [phone]) 1. Dr. Orville Govern 2. Dr. Lelon Huh 3. Dr. Jerelene Redden 4. Alwyn Ren, FNP 5. Dr. Carolynn Serve. [place] [facility] [place] [phone])   Hypoglycemia Hypoglycemia occurs when the level of sugar (glucose) in the blood is too low. Hypoglycemia can happen in people who do or do not have diabetes. It can develop quickly, and it can be a medical emergency. For most people with diabetes, a blood glucose level below 70 mg/dL (3.9 mmol/L) is considered hypoglycemia. Glucose is a type of sugar that provides the body's main source of energy. Certain hormones (insulin and glucagon) control the level of glucose in the blood. Insulin lowers blood glucose, and glucagon raises blood glucose. Hypoglycemia can result from having too much insulin in the bloodstream, or from not eating enough food that contains glucose. You may also have reactive hypoglycemia, which happens within 4 hours after eating a meal. What are the causes? Hypoglycemia occurs most often in people who have diabetes and may be caused by:  Diabetes medicine.  Not eating enough, or not eating often enough.  Increased physical activity.  Drinking alcohol on an empty stomach. If you do not have diabetes, hypoglycemia may be  caused by:  A tumor in the pancreas.  Not eating enough, or not eating for long periods at a time (fasting).  A severe infection or illness.  Certain medicines. What increases the risk? Hypoglycemia is more likely to develop in:  People who have diabetes and take medicines to lower blood glucose.  People who abuse alcohol.  People who have a severe illness. What are the signs or symptoms? Mild symptoms Mild hypoglycemia may not cause any symptoms. If you do have symptoms, they may include:  Hunger.  Anxiety.  Sweating and feeling clammy.  Dizziness or feeling light-headed.  Sleepiness.  Nausea.  Increased heart rate.  Headache.  Blurry vision.  Irritability.  Tingling or numbness around the mouth, lips, or tongue.  A change in coordination.  Restless sleep. Moderate symptoms Moderate hypoglycemia can cause:  Mental confusion and poor judgment.  Behavior changes.  Weakness.  Irregular heartbeat. Severe symptoms Severe hypoglycemia is a medical emergency. It can cause:  Fainting.  Seizures.  Loss of consciousness (coma).  Death. How is this diagnosed? Hypoglycemia is diagnosed with a blood test to measure your blood glucose level. This blood test is done while you are having symptoms. Your health care provider may also do a physical exam and review your medical history. How is this treated? This condition can often be treated by immediately eating or drinking something that contains sugar, such as:  Fruit juice, 4-6 oz (120-150 mL).  Regular soda (not diet soda), 4-6 oz (120-150 mL).  Low-fat milk, 4 oz (120 mL).  Several pieces of hard candy.  Sugar or honey, 1 Tbsp (15 mL). Treating hypoglycemia if you have diabetes If you are alert and able to swallow safely, follow the 15:15 rule:  Take 15 grams  of a rapid-acting carbohydrate. Talk with your health care provider about how much you should take.  Rapid-acting options  include: ? Glucose pills (take 15 grams). ? 6-8 pieces of hard candy. ? 4-6 oz (120-150 mL) of fruit juice. ? 4-6 oz (120-150 mL) of regular (not diet) soda. ? 1 Tbsp (15 mL) honey or sugar.  Check your blood glucose 15 minutes after you take the carbohydrate.  If the repeat blood glucose level is still at or below 70 mg/dL (3.9 mmol/L), take 15 grams of a carbohydrate again.  If your blood glucose level does not increase above 70 mg/dL (3.9 mmol/L) after 3 tries, seek emergency medical care.  After your blood glucose level returns to normal, eat a meal or a snack within 1 hour.  Treating severe hypoglycemia Severe hypoglycemia is when your blood glucose level is at or below 54 mg/dL (3 mmol/L). Severe hypoglycemia is a medical emergency. Get medical help right away. If you have severe hypoglycemia and you cannot eat or drink, you may need an injection of glucagon. A family member or close friend should learn how to check your blood glucose and how to give you a glucagon injection. Ask your health care provider if you need to have an emergency glucagon injection kit available. Severe hypoglycemia may need to be treated in a hospital. The treatment may include getting glucose through an IV. You may also need treatment for the cause of your hypoglycemia. Follow these instructions at home:  General instructions  Take over-the-counter and prescription medicines only as told by your health care provider.  Monitor your blood glucose as told by your health care provider.  Limit alcohol intake to no more than 1 drink a day for nonpregnant women and 2 drinks a day for men. One drink equals 12 oz of beer (355 mL), 5 oz of wine (148 mL), or 1 oz of hard liquor (44 mL).  Keep all follow-up visits as told by your health care provider. This is important. If you have diabetes:  Always have a rapid-acting carbohydrate snack with you to treat low blood glucose.  Follow your diabetes management plan  as directed. Make sure you: ? Know the symptoms of hypoglycemia. It is important to treat it right away to prevent it from becoming severe. ? Take your medicines as directed. ? Follow your exercise plan. ? Follow your meal plan. Eat on time, and do not skip meals. ? Check your blood glucose as often as directed. Always check before and after exercise. ? Follow your sick day plan whenever you cannot eat or drink normally. Make this plan in advance with your health care provider.  Share your diabetes management plan with people in your workplace, school, and household.  Check your urine for ketones when you are ill and as told by your health care provider.  Carry a medical alert card or wear medical alert jewelry. Contact a health care provider if:  You have problems keeping your blood glucose in your target range.  You have frequent episodes of hypoglycemia. Get help right away if:  You continue to have hypoglycemia symptoms after eating or drinking something containing glucose.  Your blood glucose is at or below 54 mg/dL (3 mmol/L).  You have a seizure.  You faint. These symptoms may represent a serious problem that is an emergency. Do not wait to see if the symptoms will go away. Get medical help right away. Call your local emergency services (911 in the [place] Summary  Hypoglycemia occurs when the level of sugar (glucose) in the blood is too low.  Hypoglycemia can happen in people who do or do not have diabetes. It can develop quickly, and it can be a medical emergency.  Make sure you know the symptoms of hypoglycemia and how to treat it.  Always have a rapid-acting carbohydrate snack with you to treat low blood sugar. This information is not intended to replace advice given to you by your health care provider. Make sure you discuss any questions you have with your health care provider. Document Released: 01/14/2005 Document Revised: 07/07/2017 Document Reviewed:  02/17/2015 Elsevier Patient Education  [place]. Hemoglobin A1c Test Why am I having this test? You may have the hemoglobin A1c test (HbA1c test) done to:  Evaluate your risk for developing diabetes (diabetes mellitus).  Diagnose diabetes.  Monitor long-term control of blood sugar (glucose) in people who have diabetes and help make treatment decisions. This test may be done with other blood glucose tests, such as fasting blood glucose and oral glucose tolerance tests. What is being tested? Hemoglobin is a type of protein in the blood that carries oxygen. Glucose attaches to hemoglobin to form glycated hemoglobin. This test checks the amount of glycated hemoglobin in your blood, which is a good indicator of the average amount of glucose in your blood during the past 2-3 months. What kind of sample is taken?  A blood sample is required for this test. It is usually collected by inserting a needle into a blood vessel. Tell a health care provider about:  All medicines you are taking, including vitamins, herbs, eye drops, creams, and over-the-counter medicines.  Any blood disorders you have.  Any surgeries you have had.  Any medical conditions you have.  Whether you are pregnant or may be pregnant. How are the results reported? Your results will be reported as a percentage that indicates how much of your hemoglobin has glucose attached to it (is glycated). Your health care provider will compare your results to normal ranges that were established after testing a large group of people (reference ranges). Reference ranges may vary among labs and hospitals. For this test, common reference ranges are:  Adult or child without diabetes: 4-5.6%.  Adult or child with diabetes and good blood glucose control: less than 7%. What do the results mean? If you have diabetes:  A result of less than 7% is considered normal, meaning that your blood glucose is well controlled.  A result higher  than 7% means that your blood glucose is not well controlled, and your treatment plan may need to be adjusted. If you do not have diabetes:  A result within the reference range is considered normal, meaning that you are not at high risk for diabetes.  A result of 5.7-6.4% means that you have a high risk of developing diabetes, and you may have prediabetes. Prediabetes is the condition of having a blood glucose level that is higher than it should be, but not high enough for you to be diagnosed with diabetes. Having prediabetes puts you at risk for developing type 2 diabetes (type 2 diabetes mellitus). You may have more tests, including a repeat HbA1c test.  Results of 6.5% or higher on two separate HbA1c tests mean that you have diabetes. You may have more tests to confirm the diagnosis. Abnormally low HbA1c values may be caused by:  Pregnancy.  Severe blood loss.  Receiving donated blood (transfusions).  Low red blood cell count (anemia).  Long-term kidney failure.  Some unusual forms (variants) of hemoglobin. Talk with your health care provider about what your results mean. Questions to ask your health care provider Ask your health care provider, or the department that is doing the test:  When will my results be ready?  How will I get my results?  What are my treatment options?  What other tests do I need?  What are my next steps? Summary  The hemoglobin A1c test (HbA1c test) may be done to evaluate your risk for developing diabetes, to diagnose diabetes, and to monitor long-term control of blood sugar (glucose) in people who have diabetes and help make treatment decisions.  Hemoglobin is a type of protein in the blood that carries oxygen. Glucose attaches to hemoglobin to form glycated hemoglobin. This test checks the amount of glycated hemoglobin in your blood, which is a good indicator of the average amount of glucose in your blood during the past 2-3 months.  Talk with  your health care provider about what your results mean. This information is not intended to replace advice given to you by your health care provider. Make sure you discuss any questions you have with your health care provider. Document Released: 02/06/2004 Document Revised: 12/27/2016 Document Reviewed: 08/27/2016 Elsevier Patient Education  2020 [employer].

## 2018-09-23 NOTE — Consult Note (Signed)
   [facility] Inpatient Consult   09/23/2018  [name] [birth year] [phone]  Patient is currently active with [facility] for chronic disease management services.  Patient has been engaged by a [place].  Our community based plan of care has focused on disease management and community resource support.    Chart reviewed and shows per MD notes from HPI::  HPI: Courtney Goodman is a 73 y.o. female with medical history significant for insulin-dependent diabetes mellitus, asthma or COPD, chronic kidney disease stage III, coronary artery disease, and depression with anxiety, now presenting to the emergency department for evaluation of nausea and vomiting.  Patient reports that she developed nausea with recurrent nonbloody vomiting on 09/21/2018, denies any associated fevers, chills, abdominal pain, or diarrhea.  Patient denies any travel or sick contacts and believes that her symptoms are secondary to recent changes in her medications.  Specifically, the patient reports that her insulin was recently changed to [place] and she believes her symptoms are secondary to this.  She thought that she was hypoglycemic when she developed the symptoms, but has been checking her blood sugar which has been in the low 200s mainly, and there were no low readings.  She denies any chest pain, cough, or shortness of breath. Current A1C 9.8% noted.  Plan: Will follow up Inpatient [facility] team member to make aware that [facility] following. Of note, [facility] Care Management services does not replace or interfere with any services that are needed or arranged by inpatient [facility] care management team.  For additional questions or referrals please contact:   Natividad Brood, RN BSN [facility] Liaison  [phone] business mobile phone Toll free office [phone]  Fax number: [phone] [email or web address]

## 2018-09-23 NOTE — Progress Notes (Signed)
Called patients daughter [name] and gave her update per patients request.

## 2018-09-23 NOTE — Plan of Care (Signed)
  Problem: Education: Goal: Knowledge of General Education information will improve Description Including pain rating scale, medication(s)/side effects and non-pharmacologic comfort measures Outcome: Progressing   

## 2018-09-23 NOTE — Progress Notes (Signed)
Initial Nutrition Assessment  DOCUMENTATION CODES:   Not applicable  INTERVENTION:    Glucerna Shake po BID, each supplement provides 220 kcal and 10 grams of protein  MVI daily  NUTRITION DIAGNOSIS:   Inadequate oral intake related to nausea, vomiting as evidenced by per patient/family report.  GOAL:   Patient will meet greater than or equal to 90% of their needs  MONITOR:   PO intake, Supplement acceptance, Weight trends, Labs, I & O's  REASON FOR ASSESSMENT:   Malnutrition Screening Tool    ASSESSMENT:   Patient with PMH significant for DM, COPD, CKD III, coronary disease and depression/anxiety. Presents this admission with AKI on CKD and N/V.   RD working remotely.  Spoke with pt via phone. Reports she noticed a decrease in appetite 2-3 days PTA due to worsening nausea/vomiting. She believes these symptoms started as a result of taking new insulin Tresiba. During this time period she was unable to keep anything down. Prior to this her appetite fluctuated- some days she would eat 2-3 times daily and others she would find herself skipping meals. Meal completions charted this admission as 70% for her last meal. Discussed the importance of protein intake for preservation of lean body mass. Pt willing to try supplementation.   Pt endorses a UBW of 180 lb and an unknown amount of weight loss. Records indicate pt weighed 172 lb on 3/`6 and 167 lb this admission (2.9% wt loss in 5 months, insignificant for time frame).   Drips: NS @ 100 ml/hr  Medications: SS novolog, levemir Labs: Cr 2.99- trending down CBG 72-186   Diet Order:   Diet Order            Diet heart healthy/carb modified Room service appropriate? Yes; Fluid consistency: Thin  Diet effective now              EDUCATION NEEDS:   Education needs have been addressed  Skin:  Skin Assessment: Reviewed RN Assessment  Last BM:  8/24  Height:   Ht Readings from Last 1 Encounters:  09/22/18 5\' 4"   (1.626 m)    Weight:   Wt Readings from Last 1 Encounters:  09/23/18 75.9 kg    Ideal Body Weight:  54.5 kg  BMI:  Body mass index is 28.72 kg/m.  Estimated Nutritional Needs:   Kcal:  1700-1900 kcal  Protein:  85-100 grams  Fluid:  >/= 1.7 L/day   Mariana Single RD, LDN Clinical Nutrition Pager # - [phone]

## 2018-09-23 NOTE — Progress Notes (Signed)
New Admission Note: ? Arrival Method: Stretcher Mental Orientation: Alert and Oriented x4 Telemetry: No Assessment: Completed Skin: Refer to flowsheet IV: Right Antecubital Pain: 0/10 Tubes: None Safety Measures: Safety Fall Prevention Plan discussed with patient. Admission: Completed 5 [place] Orientation: Patient has been orientated to the room, unit and the staff. Family: None at the Bedtime at this time Orders have been reviewed and are being implemented. Will continue to monitor the patient. Call light has been placed within reach and bed alarm has been activated.  ? Milagros Loll), RN  Phone Number: [phone]

## 2018-09-23 NOTE — Progress Notes (Signed)
PROGRESS NOTE    Courtney Goodman  NID:[id number] DOB: 1945/06/22 DOA: 09/22/2018 PCP: [name], NP-C   Brief Narrative:  Patient is a 73 year old female with history of insulin-dependent diabetes mellitus, asthma/COPD, CKD stage III, coronary disease, depression with anxiety who presented to the emergency department with complaints of nausea and vomiting.  She was found to have significant acute kidney injury on presentation.  Started on IV fluids. She is complaining of dizziness, lightheadedness today.  Assessment & Plan:   Principal Problem:   Acute renal failure superimposed on stage 3 chronic kidney disease (HCC) Active Problems:   HTN (hypertension)   COPD (chronic obstructive pulmonary disease) (HCC)   GAD (generalized anxiety disorder)   Major depression, recurrent (HCC)   CAD (coronary artery disease)   Type II diabetes mellitus with renal manifestations (HCC)   Leukocytosis   Acute kidney injury on CKD stage III: Presented with 2 days history of nausea and vomiting.  Creatinine baseline is 1.6.  Creatinine in the range of 3 on presentation.  Improving with IV fluids.  Continue IV fluids for today.  Check BMP tomorrow  Nausea with vomiting: Reported severe nausea, vomiting but discharged on 8/24.  Abdominal examination is benign.  Lipase, LFT is normal.  Nausea and vomiting have improved today.  She has tolerated the diet.  She believes her nausea and vomiting is from [place].  Insulin-dependent diabetes mellitus: Hemoglobin A1c of 9.8 as per September 08, 2018.  Follows with Dr. Loanne Drilling, endocrinology.  I have requested for diabetic coordinator consult for possible consideration of changing the insulin therapy.  Hypertension: Currently blood pressure stable.  Continue current meds ACE inhibitor on hold due to AKI  Coronary artery disease: Continue aspirin, statin  Depression/anxiety: Home meds will be continued.  Leukocytosis: Improved, most likely  reactive.  Asthma: No shortness of breath, cough or wheezing.  Chest clear on auscultation.  Dizziness: Patient reports that she lives alone and falls frequently at home.  I have requested PT evaluation         DVT prophylaxis: Heparin [place] Code Status: Full Family Communication: None present at the bedside Disposition Plan: Likely home tomorrow   Consultants: None  Procedures: None  Antimicrobials:  Anti-infectives (From admission, onward)   None      Subjective:  Patient seen and examined the bedside .  Currently hemodynamically stable.  Sitting in the chair.  Nausea and vomiting have improved.  She has had her lunch.  Complaints of dizziness.  Objective: Vitals:   09/22/18 2247 09/23/18 0436 09/23/18 0546 09/23/18 0900  BP: (!) 144/75  110/61 (!) 99/48  Pulse: 72  63 62  Resp: 19  18 18   Temp: 98 F (36.7 C)  97.7 F (36.5 C) 98.2 F (36.8 C)  TempSrc: Oral  Oral Oral  SpO2: 98%  100% 97%  Weight: 75.9 kg 75.9 kg    Height: 5\' 4"  (1.626 m)       Intake/Output Summary (Last 24 hours) at 09/23/2018 1319 Last data filed at 09/23/2018 0900 Gross per 24 hour  Intake 1212.11 ml  Output 0 ml  Net 1212.11 ml   Filed Weights   09/22/18 2247 09/23/18 0436  Weight: 75.9 kg 75.9 kg    Examination:  General exam: Appears calm and comfortable ,Not in distress,average built HEENT:PERRL,Oral mucosa moist, Ear/Nose normal on gross exam Respiratory system: Bilateral equal air entry, normal vesicular breath sounds, no wheezes or crackles  Cardiovascular system: S1 & S2 heard, RRR. No JVD,  murmurs, rubs, gallops or clicks. No pedal edema. Gastrointestinal system: Abdomen is nondistended, soft and nontender. No organomegaly or masses felt. Normal bowel sounds heard. Central nervous system: Alert and oriented. No focal neurological deficits. Extremities: No edema, no clubbing ,no cyanosis, distal peripheral pulses palpable. Skin: No rashes, lesions or ulcers,no icterus  ,no pallor MSK: Normal muscle bulk,tone ,power Psychiatry: Judgement and insight appear normal. Mood & affect appropriate.     Data Reviewed: I have personally reviewed following labs and imaging studies  CBC: Recent Labs  Lab 09/21/18 2330 09/22/18 1945 09/23/18 0609  WBC 9.0 15.4* 9.8  NEUTROABS  --  10.4* 5.7  HGB 12.6 12.8 10.8*  HCT 39.0 39.8 33.1*  MCV 89.7 92.3 91.9  PLT 452* 401* 371   Basic Metabolic Panel: Recent Labs  Lab 09/21/18 2330 09/22/18 1945 09/23/18 0609  NA 139 134* 137  K 3.9 3.7 3.6  CL 100 97* 107  CO2 24 25 21*  GLUCOSE 180* 175* 147*  BUN 30* 48* 48*  CREATININE 1.79* 3.48* 2.99*  CALCIUM 10.9* 10.4* 9.1   GFR: Estimated Creatinine Clearance: 16.7 mL/min (A) (by C-G formula based on SCr of 2.99 mg/dL (H)). Liver Function Tests: Recent Labs  Lab 09/21/18 2330 09/22/18 1945  AST 19 17  ALT 12 13  ALKPHOS 55 55  BILITOT 0.8 1.0  PROT 8.7* 8.9*  ALBUMIN 4.0 4.2   Recent Labs  Lab 09/21/18 2330 09/22/18 1945  LIPASE 25 29   No results for input(s): AMMONIA in the last 168 hours. Coagulation Profile: No results for input(s): INR, PROTIME in the last 168 hours. Cardiac Enzymes: No results for input(s): CKTOTAL, CKMB, CKMBINDEX, TROPONINI in the last 168 hours. BNP (last 3 results) No results for input(s): PROBNP in the last 8760 hours. HbA1C: No results for input(s): HGBA1C in the last 72 hours. CBG: Recent Labs  Lab 09/22/18 1954 09/22/18 2249 09/22/18 2323 09/23/18 0652 09/23/18 1132  GLUCAP 186* 127* 182* 133* 72   Lipid Profile: No results for input(s): CHOL, HDL, LDLCALC, TRIG, CHOLHDL, LDLDIRECT in the last 72 hours. Thyroid Function Tests: No results for input(s): TSH, T4TOTAL, FREET4, T3FREE, THYROIDAB in the last 72 hours. Anemia Panel: No results for input(s): VITAMINB12, FOLATE, FERRITIN, TIBC, IRON, RETICCTPCT in the last 72 hours. Sepsis Labs: No results for input(s): PROCALCITON, LATICACIDVEN in the  last 168 hours.  Recent Results (from the past 240 hour(s))  SARS CORONAVIRUS 2 (TAT 6-12 HRS) Nasal Swab Aptima Multi Swab     Status: None   Collection Time: 09/22/18  8:49 PM   Specimen: Aptima Multi Swab; Nasal Swab  Result Value Ref Range Status   SARS Coronavirus 2 NEGATIVE NEGATIVE Final    Comment: (NOTE) SARS-CoV-2 target nucleic acids are NOT DETECTED. The SARS-CoV-2 RNA is generally detectable in upper and lower respiratory specimens during the acute phase of infection. Negative results do not preclude SARS-CoV-2 infection, do not rule out co-infections with other pathogens, and should not be used as the sole basis for treatment or other patient management decisions. Negative results must be combined with clinical observations, patient history, and epidemiological information. The expected result is Negative. Fact Sheet for Patients: SugarRoll.be Fact Sheet for Healthcare Providers: https://www.woods-mathews.com/ This test is not yet approved or cleared by the [place] FDA and  has been authorized for detection and/or diagnosis of SARS-CoV-2 by FDA under an Emergency Use Authorization (EUA). This EUA will remain  in effect (meaning this test can be used) for the  duration of the COVID-19 declaration under Section 56 4(b)(1) of the Act, 21 U.S.C. section 360bbb-3(b)(1), unless the authorization is terminated or revoked sooner. Performed at [facility], [place] [address], [place], [place] [address]          Radiology Studies: No results found.      Scheduled Meds: . amLODipine  5 mg Oral Daily  . aspirin EC  81 mg Oral Daily  . carvedilol  6.25 mg Oral BID  . citalopram  20 mg Oral Daily  . heparin  5,000 Units Subcutaneous Q8H  . insulin aspart  0-5 Units Subcutaneous QHS  . insulin aspart  0-9 Units Subcutaneous TID WC  . insulin detemir  20 Units Subcutaneous BID  . pantoprazole  40 mg Oral Daily  .  traZODone  50 mg Oral QHS   Continuous Infusions: . sodium chloride       LOS: 1 day    Time spent: 35 min.More than 50% of that time was spent in counseling and/or coordination of care.      Shelly Coss, MD [facility] Pager [phone]  If 7PM-7AM, please contact night-coverage [email or web address] Password [email or web address] 09/23/2018, 1:19 PM

## 2018-09-23 NOTE — Progress Notes (Signed)
Inpatient Diabetes Program Recommendations  AACE/ADA: New Consensus Statement on Inpatient Glycemic Control (2015)  Target Ranges:  Prepandial:   less than 140 mg/dL      Peak postprandial:   less than 180 mg/dL (1-2 hours)      Critically ill patients:  140 - 180 mg/dL   Lab Results  Component Value Date   GLUCAP 72 09/23/2018   HGBA1C 9.8 (A) 09/15/2018    Review of Glycemic Control Results for Courtney Goodman, MCTIGUE (MRN [id number]) as of 09/23/2018 15:49  Ref. Range 09/22/2018 23:23 09/23/2018 06:52 09/23/2018 11:32  Glucose-Capillary Latest Ref Range: 70 - 99 mg/dL 182 (H) 133 (H) 72   Diabetes history: Type 2 DM Outpatient Diabetes medications: Tresiba 100 units QD, Victoza 1.8 mg QD Current orders for Inpatient glycemic control: Novolog 0-9 units TID, Novolog 0-5 units QHS, Levemir 20 units BID  Inpatient Diabetes Program Recommendations:    Spoke with patient regarding outpatient DM management. Patient is followed by Dr Loanne Drilling with last appointment on 09/15/2018. At that visit had taken patient off of previous medications due to "non compliance with medications." Since this appointment, patient reports having hypoglycemic episodes of 20's mg/dL. Patient was on BID dosing of Levemir prior to this change, however, cannot remember dosages. Reviewed patient's current A1c of 9.8%. Explained what a A1c is and what it measures. Also reviewed goal A1c with patient, importance of good glucose control @ home, and blood sugar goals. Reviewed patho of DM, need for insulin, role of pancreas, action time of Tresiba, when to call MD, differences between outpatient dosages vs. Inpatient insulin requirements and glucose trends, vascular changes and co-morbidies.  Patient has a meter and reports checking 3 times per day. Looking at current trends, could consider Levemir 15 units BID at discharge (Levemir flexpen #[id number], Insulin pen needles [id number] [phone]).  Thanks, Bronson Curb, MSN, RNC-OB Diabetes  Coordinator [phone] (8a-5p)

## 2018-09-24 LAB — BASIC METABOLIC PANEL
Anion gap: 6 (ref 5–15)
BUN: 38 mg/dL — ABNORMAL HIGH (ref 8–23)
CO2: 23 mmol/L (ref 22–32)
Calcium: 9 mg/dL (ref 8.9–10.3)
Chloride: 113 mmol/L — ABNORMAL HIGH (ref 98–111)
Creatinine, Ser: 1.74 mg/dL — ABNORMAL HIGH (ref 0.44–1.00)
GFR calc Af Amer: 33 mL/min — ABNORMAL LOW (ref >60)
GFR calc non Af Amer: 29 mL/min — ABNORMAL LOW (ref >60)
Glucose, Bld: 45 mg/dL — ABNORMAL LOW (ref 70–99)
Potassium: 3.8 mmol/L (ref 3.5–5.1)
Sodium: 142 mmol/L (ref 135–145)

## 2018-09-24 LAB — GLUCOSE, CAPILLARY
Glucose-Capillary: 39 mg/dL — CL (ref 70–99)
Glucose-Capillary: 76 mg/dL (ref 70–99)
Glucose-Capillary: 193 mg/dL — ABNORMAL HIGH (ref 70–99)
Glucose-Capillary: 386 mg/dL — ABNORMAL HIGH (ref 70–99)

## 2018-09-24 MED ORDER — INSULIN PEN NEEDLE 31G X 5 MM MISC: 1.0000 | Freq: Two times a day (BID) | 0 refills | Status: DC

## 2018-09-24 MED ORDER — INSULIN DETEMIR 100 UNIT/ML FLEXPEN: 15.0000 [IU] | PEN_INJECTOR | Freq: Every day | SUBCUTANEOUS | 0 refills | Status: DC

## 2018-09-24 MED ORDER — INSULIN DETEMIR 100 UNIT/ML ~~LOC~~ SOLN
15.0000 [IU] | Freq: Two times a day (BID) | SUBCUTANEOUS | Status: DC
  Administered 2018-09-24: 15 [IU] via SUBCUTANEOUS
  Filled 2018-09-24 (×2): qty 0.15

## 2018-09-24 NOTE — Progress Notes (Signed)
Courtney Goodman to be discharged Home per MD order. Discussed prescriptions and follow up appointments with the patient. Prescriptions given to patient; medication list explained in detail. Patient verbalized understanding.  Skin clean, dry and intact without evidence of skin break down, no evidence of skin tears noted. IV catheter discontinued intact. Site without signs and symptoms of complications. Dressing and pressure applied. Pt denies pain at the site currently. No complaints noted.  Patient free of lines, drains, and wounds.   An After Visit Summary (AVS) was printed and given to the patient. Patient escorted via wheelchair, and discharged home via private auto.  Shela Commons, RN

## 2018-09-24 NOTE — Consult Note (Signed)
   [facility] Inpatient Consult   09/24/2018  Courtney Goodman Courtney Goodman 1945/06/10 [id number]  Follow up: Spoke with daughter [name] and confirmed HIPAA and patient's disposition back to her apartment. Patient will be assigned to [place] for transitional care.  Patient's primary care provider is [name], NP-C with [employer] this office is 'not' listed on the transition of care follow up list.  For questions, please contact:  Natividad Brood, RN BSN [facility] Liaison  [phone] business mobile phone Toll free office [phone]  Fax number: [phone] [email or web address]

## 2018-09-24 NOTE — TOC Initial Note (Addendum)
Transition of Care [facility]) - Initial/Assessment Note    Patient Details  Name: Courtney Goodman MRN: [id number] Date of Birth: February 07, 1945  Transition of Care [facility] CM/SW Contact:    Bartholomew Crews, RN Phone Number: [phone] 09/24/2018, 1:39 PM  Clinical Narrative:                 Noted that patient to transition home with [facility]. Spoke with patient at the bedside. Patient overwhelmed with her circumstances. Reviewed role of CM to assist with transition home and offered choice for [facility] agency. Patient agreed for CM to speak with her daughter, [name]. Spoke with [name] on the phone. Discussed that skilled nurse can assess patient and offer education and medication management. Referral placed to [facility] for RN, PT. [facility] notified of need for 3N1 and RW, and will deliver to bedside. Bedside RN to notify daughter when patient ready. [facility] liaison notified of patient transition home with [place]. No other transition of care needs identified at this time.   Update: SW added to [facility] order to assist with help with community resources vs STR.   Expected Discharge Plan: [place] Barriers to Discharge: No Barriers Identified   Patient Goals and CMS Choice Patient states their goals for this hospitalization and ongoing recovery are:: return home CMS Medicare.gov Compare Post Acute Care list provided to:: Patient Choice offered to / list presented to : Patient, Adult Children([name] - daughter)  Expected Discharge Plan and Services Expected Discharge Plan: [place] In-house Referral: [facility] Discharge Planning Services: CM Consult Post Acute Care Choice: Durable Medical Equipment, Home Health Living arrangements for the past 2 months: Apartment Expected Discharge Date: 09/24/18               DME Arranged: 3-N-1, [place] Rile rolling DME Agency: [facility] Date DME Agency Contacted: 09/24/18 Time DME Agency Contacted: [phone] Representative spoke with at DME Agency: [place]: RN, PT [place] Agency: [place] Date [place]: 09/24/18 Time [place]: 41 Representative spoke with at [place]: Butch Penny  Prior Living Arrangements/Services Living arrangements for the past 2 months: Apartment Lives with:: Self Patient language and need for interpreter reviewed:: Yes Do you feel safe going back to the place where you live?: Yes      Need for Family Participation in Patient Care: Yes (Comment) Care giver support system in place?: Yes (comment)   Criminal Activity/Legal Involvement Pertinent to Current Situation/Hospitalization: No - Comment as needed  Activities of Daily Living Home Assistive Devices/Equipment: None ADL Screening (condition at time of admission) Patient's cognitive ability adequate to safely complete daily activities?: Yes Is the patient deaf or have difficulty hearing?: Yes Does the patient have difficulty seeing, even when wearing glasses/contacts?: No Does the patient have difficulty concentrating, remembering, or making decisions?: No Patient able to express need for assistance with ADLs?: No Does the patient have difficulty dressing or bathing?: No Independently performs ADLs?: Yes (appropriate for developmental age) Does the patient have difficulty walking or climbing stairs?: No Weakness of Legs: None Weakness of Arms/Hands: None  Permission Sought/Granted Permission sought to share information with : Family Supports Permission granted to share information with : Yes, Verbal Permission Granted  Share Information with NAME: [name]     Permission granted to share info w Relationship: daughter  Permission granted to share info w Contact Information: [phone]  Emotional Assessment Appearance:: Appears stated age Attitude/Demeanor/Rapport: Engaged Affect (typically observed): Accepting, Overwhelmed Orientation: :  Oriented to Self, Oriented to Place, Oriented to  Time, Oriented to  Situation Alcohol / Substance Use: Not Applicable Psych Involvement: No (comment)  Admission diagnosis:  AKI (acute kidney injury) ([place]) [N17.9] Non-intractable vomiting with nausea, unspecified vomiting type [R11.2] Patient Active Problem List   Diagnosis Date Noted  . Acute renal failure superimposed on stage 3 chronic kidney disease ([place]) 09/22/2018  . Leukocytosis 09/22/2018  . Non-intractable vomiting   . GERD (gastroesophageal reflux disease) 08/23/2017  . CAD (coronary artery disease) 08/23/2017  . Hypoglycemia 08/23/2017  . Acute metabolic encephalopathy 42/39/5320  . Hypokalemia 08/23/2017  . Type II diabetes mellitus with renal manifestations ([place]) 08/23/2017  . Elevated ferritin 01/03/2017  . Personal history of noncompliance with medical treatment, presenting hazards to health   . Urinary incontinence, mixed 05/27/2016  . Major depression, recurrent ([place]) 05/27/2016  . Atherosclerotic heart disease of native coronary artery without angina pectoris 05/27/2016  . GAD (generalized anxiety disorder)   . Anemia of chronic disease   . Mixed hyperlipidemia due to type 2 diabetes mellitus ([place])   . Diabetes ([place]) 11/30/2015  . Vitamin D deficiency 11/23/2015  . HTN (hypertension) 11/23/2015  . Anemia 11/23/2015  . CKD (chronic kidney disease), stage III ([place]) 11/23/2015  . Dysrhythmia   . COPD (chronic obstructive pulmonary disease) ([place])    PCP:  [name], NP-C Pharmacy:   [facility], [address] [place] [address] Phone: [phone] Fax: [phone]  [id number] - [place], [place] - [address] [place] [address] Phone: [phone] Fax: [phone]     Social Determinants of Health (SDOH) Interventions    Readmission Risk Interventions No flowsheet data found.

## 2018-09-24 NOTE — Evaluation (Signed)
Physical Therapy Evaluation Patient Details Name: Courtney Goodman MRN: [id number] DOB: 1945/10/21 Today's Date: 09/24/2018   History of Present Illness  73 y.o. female with medical history significant for insulin-dependent diabetes mellitus, asthma or COPD, chronic kidney disease stage III, coronary artery disease, and depression with anxiety, now presenting to ED with c/o nausea and vomiting.Admitted 09/22/18 for Acute kidney injury on CKD   Clinical Impression   PTA pt reports living alone in level entry apartment. Pt states she has a daughter who works and is not reliable about looking in on her. Pt reports numerous falls in the last month and increasing difficulty with self care. Pt is limited in safe mobility by decreased safety awareness, as well as decreased strength, balance and endurance. Pt requires min A for bed mobility, min-modA for multiple transfers to and from [facility], and minA for steadying with RW for 3 feet ambulation. PT recommending SNF level rehab at discharge to improve strength and balance prior to ultimate d/c home. PT will continue to follow acutely.     Follow Up Recommendations SNF    Equipment Recommendations  Other (comment)(TBD (will need BSC and RW if d/c home))    Recommendations for Other Services       Precautions / Restrictions Precautions Precautions: Fall Precaution Comments: multiple falls in last 2 weeks Restrictions Weight Bearing Restrictions: No      Mobility  Bed Mobility Overal bed mobility: Needs Assistance Bed Mobility: Supine to Sit     Supine to sit: Min assist     General bed mobility comments: minA for pt to pull against therapist to come to fully upright, dizziness in sitting which disipated quickly  Transfers Overall transfer level: Needs assistance Equipment used: Rolling walker (2 wheeled);1 person hand held assist Transfers: Sit to/from [facility] Sit to Stand: Min assist;Mod assist Stand pivot transfers:  Min assist       General transfer comment: min A for powerup to standing from bed, modA for steadying with RW for LoB with Sit>stand from [facility], minA for stand pivot from bed to [facility], to recliner back to [facility], and back to recliner.  Ambulation/Gait Ambulation/Gait assistance: Min assist Gait Distance (Feet): 3 Feet Assistive device: Rolling walker (2 wheeled) Gait Pattern/deviations: Step-to pattern;Decreased step length - right;Decreased step length - left;Shuffle Gait velocity: slowed Gait velocity interpretation: <1.31 ft/sec, indicative of household ambulator General Gait Details: minA for steadying with RW, pt with no prior knowledge of RW and constant vc for RW management        Balance Overall balance assessment: Needs assistance Sitting-balance support: Feet supported;No upper extremity supported Sitting balance-Leahy Scale: Fair     Standing balance support: During functional activity;No upper extremity supported Standing balance-Leahy Scale: Poor Standing balance comment: able to perform self pericare however requires single UE support                             Pertinent Vitals/Pain Pain Assessment: No/denies pain    Home Living Family/patient expects to be discharged to:: Private residence Living Arrangements: Alone Available Help at Discharge: Available PRN/intermittently;Family Type of Home: Apartment Home Access: Level entry     Home Layout: One level Home Equipment: None      Prior Function Level of Independence: Needs assistance   Gait / Transfers Assistance Needed: household ambulation without AD, struggles with longer distance ambulation, multiple falls   ADL's / Homemaking Assistance Needed: does own shopping, self care  but increasingly difficult           Extremity/Trunk Assessment   Upper Extremity Assessment Upper Extremity Assessment: Generalized weakness    Lower Extremity Assessment Lower Extremity Assessment: RLE  deficits/detail;LLE deficits/detail RLE Deficits / Details: ROM WFL, strength grossly assessed in mobility to be 4-/5 RLE Coordination: decreased fine motor LLE Deficits / Details: ROM WFL, strength grossly assessed in mobility to be 4-/5 LLE Coordination: decreased fine motor    Cervical / Trunk Assessment Cervical / Trunk Assessment: Normal  Communication   Communication: No difficulties  Cognition Arousal/Alertness: Awake/alert Behavior During Therapy: WFL for tasks assessed/performed Overall Cognitive Status: Impaired/Different from baseline Area of Impairment: Safety/judgement;Awareness;Problem solving                         Safety/Judgement: Decreased awareness of deficits;Decreased awareness of safety Awareness: Emergent Problem Solving: Slow processing;Difficulty sequencing;Requires verbal cues;Requires tactile cues General Comments: Pt unaware she was soaked in urine, decreased awareness of safety with use of DME and requires constant cuing for it's use      General Comments General comments (skin integrity, edema, etc.): Pt incontinent of urine and seemingly unaware as linens, gown and socks were saturated. When asked pt reports she "didn't know."         Assessment/Plan    PT Assessment Patient needs continued PT services  PT Problem List Decreased activity tolerance;Decreased strength;Decreased balance;Decreased mobility;Decreased knowledge of use of DME;Decreased safety awareness       PT Treatment Interventions DME instruction;Gait training;Functional mobility training;Therapeutic activities;Therapeutic exercise;Balance training;Cognitive remediation;Patient/family education    PT Goals (Current goals can be found in the Care Plan section)  Acute Rehab PT Goals Patient Stated Goal: get some help PT Goal Formulation: With patient Time For Goal Achievement: 10/08/18 Potential to Achieve Goals: Fair    Frequency Min 2X/week   Barriers to discharge  Decreased caregiver support         AM-PAC PT "6 Clicks" Mobility  Outcome Measure Help needed turning from your back to your side while in a flat bed without using bedrails?: None Help needed moving from lying on your back to sitting on the side of a flat bed without using bedrails?: A Little Help needed moving to and from a bed to a chair (including a wheelchair)?: A Little Help needed standing up from a chair using your arms (e.g., wheelchair or bedside chair)?: A Little Help needed to walk in hospital room?: A Lot Help needed climbing 3-5 steps with a railing? : Total 6 Click Score: 16    End of Session Equipment Utilized During Treatment: Gait belt Activity Tolerance: Patient tolerated treatment well Patient left: in chair;with call bell/phone within reach;with chair alarm set;with nursing/sitter in room Nurse Communication: Mobility status PT Visit Diagnosis: Unsteadiness on feet (R26.81);Other abnormalities of gait and mobility (R26.89);Repeated falls (R29.6);Muscle weakness (generalized) (M62.81);History of falling (Z91.81);Difficulty in walking, not elsewhere classified (R26.2);Dizziness and giddiness (R42)    Time: 4580-9983 PT Time Calculation (min) (ACUTE ONLY): 35 min   Charges:   PT Evaluation $PT Eval Moderate Complexity: 1 Mod PT Treatments $Gait Training: 8-22 mins        Analiz Tvedt B. Migdalia Dk PT, DPT Acute Rehabilitation Services Pager [phone] Office [phone]   [place] 09/24/2018, 10:00 AM

## 2018-09-24 NOTE — Evaluation (Signed)
Occupational Therapy Evaluation Patient Details Name: Courtney Goodman MRN: [id number] DOB: [birth year] Today's Date: 09/24/2018    History of Present Illness 73 y.o. female with medical history significant for insulin-dependent diabetes mellitus, asthma or COPD, chronic kidney disease stage III, coronary artery disease, and depression with anxiety, now presenting to ED with c/o nausea and vomiting.Admitted 09/22/18 for Acute kidney injury on CKD    Clinical Impression   Patient presenting with decreased I in self care, functional mobility, balance, safety awareness, endurance, and strength. Patient reports being independent PTA but with increasing difficulty. Patient currently functioning S - min guard level without use of AD. Patient will benefit from acute OT to increase overall independence in the areas of ADLs, functional mobility, and safety awareness in order to safely discharge home.    Follow Up Recommendations  Home health OT;Supervision/Assistance - 24 hour    Equipment Recommendations  3 in 1 bedside commode       Precautions / Restrictions Precautions Precautions: Fall Precaution Comments: multiple falls in last 2 weeks Restrictions Weight Bearing Restrictions: No      Mobility Bed Mobility        General bed mobility comments: up in recliner chair  Transfers Overall transfer level: Needs assistance Equipment used: None Transfers: Sit to/from Stand;Stand Pivot Transfers Sit to Stand: Supervision Stand pivot transfers: Supervision       General transfer comment: pt pacing back and forth in room as OT entered at supervision level without use of AD    Balance Overall balance assessment: Needs assistance Sitting-balance support: Feet supported;No upper extremity supported Sitting balance-Leahy Scale: Good     Standing balance support: During functional activity;No upper extremity supported Standing balance-Leahy Scale: Fair                              ADL either performed or assessed with clinical judgement   ADL Overall ADL's : Needs assistance/impaired Eating/Feeding: Modified independent   Grooming: Supervision/safety;Standing   Upper Body Bathing: Supervision/ safety;Sitting   Lower Body Bathing: Minimal assistance   Upper Body Dressing : Supervision/safety   Lower Body Dressing: Min guard   Toilet Transfer: Supervision/safety   Toileting- Clothing Manipulation and Hygiene: Min guard               Vision Baseline Vision/History: No visual deficits Patient Visual Report: No change from baseline       Perception     Praxis      Pertinent Vitals/Pain Pain Assessment: No/denies pain     Hand Dominance Right   Extremity/Trunk Assessment Upper Extremity Assessment Upper Extremity Assessment: Generalized weakness   Lower Extremity Assessment Lower Extremity Assessment: Defer to PT evaluation       Communication Communication Communication: No difficulties   Cognition Arousal/Alertness: Awake/alert Behavior During Therapy: WFL for tasks assessed/performed Overall Cognitive Status: Within Functional Limits for tasks assessed Area of Impairment: Safety/judgement;Problem solving      Safety/Judgement: Decreased awareness of safety;Decreased awareness of deficits                    Home Living Family/patient expects to be discharged to:: Private residence Living Arrangements: Alone(grandson lives with her but she is trying to have him move out) Available Help at Discharge: Available PRN/intermittently;Family Type of Home: Apartment Home Access: Level entry     Home Layout: One level     Bathroom Shower/Tub: Teacher, early years/pre: Standard Bathroom Accessibility:  Yes   Home Equipment: None          Prior Functioning/Environment Level of Independence: Needs assistance  Gait / Transfers Assistance Needed: household ambulation without AD, struggles with longer  distance ambulation, multiple falls  ADL's / Homemaking Assistance Needed: does own shopping, self care but increasingly difficult            OT Problem List: Decreased strength;Decreased knowledge of use of DME or AE;Decreased coordination;Decreased activity tolerance;Decreased cognition;Impaired balance (sitting and/or standing);Decreased safety awareness      OT Treatment/Interventions: Self-care/ADL training;Modalities;Therapeutic exercise;Balance training;Therapeutic activities;Energy conservation;DME and/or AE instruction;Patient/family education    OT Goals(Current goals can be found in the care plan section) Acute Rehab OT Goals Patient Stated Goal: get some help OT Goal Formulation: With patient Time For Goal Achievement: 10/08/18 Potential to Achieve Goals: Good ADL Goals Pt Will Perform Lower Body Bathing: with supervision Pt Will Perform Lower Body Dressing: with supervision Pt Will Perform Toileting - Clothing Manipulation and hygiene: with supervision Pt Will Perform Tub/Shower Transfer: with supervision  OT Frequency: Min 2X/week   Barriers to D/C: Decreased caregiver support       AM-PAC OT "6 Clicks" Daily Activity     Outcome Measure Help from another person eating meals?: None Help from another person taking care of personal grooming?: None Help from another person toileting, which includes using toliet, bedpan, or urinal?: A Little Help from another person bathing (including washing, rinsing, drying)?: A Little Help from another person to put on and taking off regular upper body clothing?: None Help from another person to put on and taking off regular lower body clothing?: A Little 6 Click Score: 21   End of Session Nurse Communication: Mobility status;Precautions  Activity Tolerance: Patient tolerated treatment well Patient left: in chair;with call bell/phone within reach  OT Visit Diagnosis: Repeated falls (R29.6);Muscle weakness (generalized)  (M62.81);History of falling (Z91.81)                Time: 1518-3437 OT Time Calculation (min): 19 min Charges:  OT General Charges $OT Visit: 1 Visit OT Evaluation $OT Eval Low Complexity: 1 Low   Phil Corti P, MS, OTR/L 09/24/2018, 3:02 PM

## 2018-09-24 NOTE — Progress Notes (Signed)
Inpatient Diabetes Program Recommendations  AACE/ADA: New Consensus Statement on Inpatient Glycemic Control (2015)  Target Ranges:  Prepandial:   less than 140 mg/dL      Peak postprandial:   less than 180 mg/dL (1-2 hours)      Critically ill patients:  140 - 180 mg/dL   Lab Results  Component Value Date   GLUCAP 76 09/24/2018   HGBA1C 9.8 (A) 09/15/2018    Review of Glycemic Control Results for MAVI, UN (MRN [id number]) as of 09/24/2018 08:58  Ref. Range 09/23/2018 21:03 09/24/2018 06:47 09/24/2018 07:12  Glucose-Capillary Latest Ref Range: 70 - 99 mg/dL 261 (H) 39 (LL) 76    Diabetes history: Type 2 DM Outpatient Diabetes medications: Tresiba 100 units QD, Victoza 1.8 mg QD Current orders for Inpatient glycemic control: Novolog 0-9 units TID, Novolog 0-5 units QHS, Levemir 20 units BID  Inpatient Diabetes Program Recommendations:    Noted hypoglycemic episode this AM of 39 mg/dL. Consider decreasing Levemir to 15 units BID. In preparation for discharge, Levemir flexpen [id number], Insulin pen needles [phone].   Thanks, Bronson Curb, MSN, RNC-OB Diabetes Coordinator [phone] (8a-5p)

## 2018-09-24 NOTE — Discharge Summary (Signed)
Physician Discharge Summary  Joniah Bednarski Coletti GGE:[id number] DOB: 1945/05/03 DOA: 09/22/2018  PCP: [name], NP-C  Admit date: 09/22/2018 Discharge date: 09/24/2018  Admitted From: Home Disposition:  Home  Discharge Condition:Stable CODE STATUS:FULL Diet recommendation:  Carb Modified    Brief/Interim Summary:  Patient is a 73 year old female with history of insulin-dependent diabetes mellitus, asthma/COPD, CKD stage III, coronary disease, depression with anxiety who presented to the emergency department with complaints of nausea and vomiting.  She was found to have significant acute kidney injury on presentation.  Started on IV fluids.  Patient's kidney function improved with IV fluids.  She was seen by physical therapy and recommended skilled nursing facility on discharge but after discussion with her daughter, she wants her mom to go home .  Patient is hemodynamically stable for discharge today.  Home health arranged.  Following problems were addressed during her hospitalization:  Acute kidney injury on CKD stage III: Presented with 2 days history of nausea and vomiting.  Creatinine baseline is 1.6.  Creatinine in the range of 3 on presentation.  Improved with IV fluids.   Nausea with vomiting: Reported severe nausea, vomiting but discharged on 8/24.  Abdominal examination is benign.  Lipase, LFT is normal.  Nausea and vomiting have improved today.  She has tolerated the diet.  She believes her nausea and vomiting is from [place].  Insulin-dependent diabetes mellitus: Hemoglobin A1c of 9.8 as per September 08, 2018.  Follows with Dr. Loanne Drilling, endocrinology.Insulin changed to levimir on discharge.  Hypertension: Currently blood pressure stable.  Continue current meds   Coronary artery disease: Continue aspirin, statin  Depression/anxiety: Home meds will be continued.  Leukocytosis: Improved, most likely reactive.  Asthma: No shortness of breath, cough or wheezing.  Chest  clear on auscultation.  Dizziness: Patient reports that she lives alone and falls frequently at home.  PT evaluation done and recommended SNF. Daughter wanted to take her home.     Discharge Diagnoses:  Principal Problem:   Acute renal failure superimposed on stage 3 chronic kidney disease (HCC) Active Problems:   HTN (hypertension)   COPD (chronic obstructive pulmonary disease) (HCC)   GAD (generalized anxiety disorder)   Major depression, recurrent (HCC)   CAD (coronary artery disease)   Type II diabetes mellitus with renal manifestations ([place])   Leukocytosis    Discharge Instructions  Discharge Instructions    Diet - low sodium heart healthy   Complete by: As directed    Discharge instructions   Complete by: As directed    1) Follow up with your endocrinologist in a week. 2)Follow up with your PCP in a week .Do a BMP test during the follow-up. 3)Take prescribed medications as instructed. 4)Follow up with home health physical therapy.   Increase activity slowly   Complete by: As directed      Allergies as of 09/24/2018   No Known Allergies     Medication List    STOP taking these medications   Tresiba FlexTouch 200 UNIT/ML Sopn Generic drug: Insulin Degludec     TAKE these medications   albuterol 108 (90 Base) MCG/ACT inhaler Commonly known as: VENTOLIN HFA Inhale 2 puffs into the lungs every 6 (six) hours as needed for wheezing or shortness of breath.   Alcohol Prep Pads Use for testing   amLODipine-benazepril 5-20 MG capsule Commonly known as: LOTREL Take 1 capsule by mouth daily.   aspirin EC 81 MG tablet Take 81 mg by mouth daily.   Biotin 10000 MCG  Tabs Take 1 tablet by mouth daily.   carvedilol 6.25 MG tablet Commonly known as: COREG Take 1 tablet by mouth twice daily.   cholecalciferol 25 MCG (1000 UT) tablet Commonly known as: VITAMIN D Take 1 tablet by mouth daily.   citalopram 20 MG tablet Commonly known as: CELEXA Take 1 tablet  (20 mg total) by mouth daily.   fenofibrate 145 MG tablet Commonly known as: TRICOR Take 1 tablet (145 mg total) by mouth daily.   fluticasone 50 MCG/ACT nasal spray Commonly known as: FLONASE Place 2 sprays into both nostrils daily.   folic acid 1 MG tablet Commonly known as: FOLVITE Take 1 tablet (1 mg total) by mouth daily.   glucose blood test strip Commonly known as: True Metrix Blood Glucose Test Use to monitor glucose levels twice per day   Insulin Detemir 100 UNIT/ML Pen Commonly known as: LEVEMIR Inject 15 Units into the skin daily.   Insulin Pen Needle 31G X 5 MM Misc Inject 1 each into the skin 2 (two) times daily. Use to inject insulin twice daily; E11.29 What changed: Another medication with the same name was added. Make sure you understand how and when to take each.   Insulin Pen Needle 31G X 5 MM Misc 1 each by Does not apply route 2 (two) times daily. What changed: You were already taking a medication with the same name, and this prescription was added. Make sure you understand how and when to take each.   liraglutide 18 MG/3ML Sopn Commonly known as: VICTOZA Inject 1.8 mg into the skin daily.   omeprazole 40 MG capsule Commonly known as: PRILOSEC Take 1 capsule (40 mg total) by mouth daily.   simvastatin 20 MG tablet Commonly known as: ZOCOR Take 1 tablet by mouth every night.   traMADol 50 MG tablet Commonly known as: ULTRAM Take 1 tablet by mouth daily as needed.   traZODone 50 MG tablet Commonly known as: DESYREL Take 50 mg by mouth at bedtime. Patient gets this from her therapist, not PCP      Follow-up Information    Henson, Vickie L, NP-C. Schedule an appointment as soon as possible for a visit in 1 week(s).   Specialty: Family Medicine Contact information: [place] [address] [phone]          No Known Allergies  Consultations:  None   Procedures/Studies: Dg Chest 2 View  Result Date:  08/25/2018 CLINICAL DATA:  Chest pain and shortness of breath EXAM: CHEST - 2 VIEW COMPARISON:  August 23, 2017 FINDINGS: There is scarring in the left upper lobe, stable. There is linear atelectasis in the left base. Lungs elsewhere clear. Heart size and pulmonary vascularity are normal. No adenopathy. No bone lesions. IMPRESSION: Left base atelectasis. Scarring left upper lobe. No edema or consolidation. Stable cardiac silhouette. Electronically Signed   By: Lowella Grip III M.D.   On: 08/25/2018 20:13   Ct Angio Chest Pe W And/or Wo Contrast  Result Date: 08/25/2018 CLINICAL DATA:  Right chest pain, elevated D-dimer EXAM: CT ANGIOGRAPHY CHEST WITH CONTRAST TECHNIQUE: Multidetector CT imaging of the chest was performed using the standard protocol during bolus administration of intravenous contrast. Multiplanar CT image reconstructions and MIPs were obtained to evaluate the vascular anatomy. CONTRAST:  55mL OMNIPAQUE IOHEXOL 350 MG/ML SOLN COMPARISON:  Chest radiograph dated 08/25/2018 FINDINGS: Cardiovascular: Satisfactory opacification of the bilateral pulmonary arteries to the segmental level. No evidence of pulmonary embolism. No evidence of thoracic aortic aneurysm  or dissection. Mild atherosclerotic calcifications of the aortic arch. Heart is normal in size. No pericardial effusion. Three vessel coronary atherosclerosis. Mediastinum/Nodes: No suspicious mediastinal lymphadenopathy. Calcified prevascular nodes, benign. Visualized thyroid is unremarkable. Lungs/Pleura: Scarring in the left upper lobe. Scarring with bronchiectasis in the anterior left lower lobe. Associated calcifications (series 5/image 93), likely chronic. Mild mosaic attenuation. No suspicious pulmonary nodules. No focal consolidation. No pleural effusion or pneumothorax. Upper Abdomen: Visualized upper abdomen is grossly unremarkable. Musculoskeletal: Mild degenerative changes of the mid/lower thoracic spine. Review of the MIP images  confirms the above findings. IMPRESSION: No evidence of pulmonary embolism. Left upper and lower lobe scarring. No evidence of acute cardiopulmonary disease. Aortic Atherosclerosis (ICD10-I70.0). Electronically Signed   By: Julian Hy M.D.   On: 08/25/2018 22:17   Mm 3d Screen Breast Bilateral  Result Date: 09/01/2018 CLINICAL DATA:  Screening. EXAM: DIGITAL SCREENING BILATERAL MAMMOGRAM WITH TOMO AND CAD COMPARISON:  Previous exam(s). ACR Breast Density Category a: The breast tissue is almost entirely fatty. FINDINGS: There are no findings suspicious for malignancy. Images were processed with CAD. IMPRESSION: No mammographic evidence of malignancy. A result letter of this screening mammogram will be mailed directly to the patient. RECOMMENDATION: Screening mammogram in one year. (Code:[id number]) BI-RADS CATEGORY  1: Negative. Electronically Signed   By: Lajean Manes M.D.   On: 09/01/2018 14:12       Subjective:  Patient seen and examined the bedside this morning. Hemodynamically  stable for discharge.  Discharge Exam: Vitals:   09/24/18 0948 09/24/18 0951  BP:  (!) 144/57  Pulse: 61   Resp:    Temp:    SpO2:     Vitals:   09/24/18 0515 09/24/18 0840 09/24/18 0948 09/24/18 0951  BP: (!) 126/41 (!) 144/57  (!) 144/57  Pulse: (!) 54 (!) 59 61   Resp: 18 14    Temp: (!) 97.5 F (36.4 C) (!) 97.4 F (36.3 C)    TempSrc: Oral Oral    SpO2: 97% 100%    Weight:      Height:        General: Pt is alert, awake, not in acute distress Cardiovascular: RRR, S1/S2 +, no rubs, no gallops Respiratory: CTA bilaterally, no wheezing, no rhonchi Abdominal: Soft, NT, ND, bowel sounds + Extremities: no edema, no cyanosis    The results of significant diagnostics from this hospitalization (including imaging, microbiology, ancillary and laboratory) are listed below for reference.     Microbiology: Recent Results (from the past 240 hour(s))  SARS CORONAVIRUS 2 (TAT 6-12 HRS) Nasal Swab  Aptima Multi Swab     Status: None   Collection Time: 09/22/18  8:49 PM   Specimen: Aptima Multi Swab; Nasal Swab  Result Value Ref Range Status   SARS Coronavirus 2 NEGATIVE NEGATIVE Final    Comment: (NOTE) SARS-CoV-2 target nucleic acids are NOT DETECTED. The SARS-CoV-2 RNA is generally detectable in upper and lower respiratory specimens during the acute phase of infection. Negative results do not preclude SARS-CoV-2 infection, do not rule out co-infections with other pathogens, and should not be used as the sole basis for treatment or other patient management decisions. Negative results must be combined with clinical observations, patient history, and epidemiological information. The expected result is Negative. Fact Sheet for Patients: SugarRoll.be Fact Sheet for Healthcare Providers: https://www.woods-mathews.com/ This test is not yet approved or cleared by the [place] FDA and  has been authorized for detection and/or diagnosis of SARS-CoV-2 by FDA under an Emergency  Use Authorization (EUA). This EUA will remain  in effect (meaning this test can be used) for the duration of the COVID-19 declaration under Section 56 4(b)(1) of the Act, 21 U.S.C. section 360bbb-3(b)(1), unless the authorization is terminated or revoked sooner. Performed at [facility], [place] [address], [place], [place] [address]      Labs: BNP (last 3 results) Recent Labs    08/25/18 1928  BNP 82.5   Basic Metabolic Panel: Recent Labs  Lab 09/21/18 2330 09/22/18 1945 09/23/18 0609 09/24/18 0538  NA 139 134* 137 142  K 3.9 3.7 3.6 3.8  CL 100 97* 107 113*  CO2 24 25 21* 23  GLUCOSE 180* 175* 147* 45*  BUN 30* 48* 48* 38*  CREATININE 1.79* 3.48* 2.99* 1.74*  CALCIUM 10.9* 10.4* 9.1 9.0   Liver Function Tests: Recent Labs  Lab 09/21/18 2330 09/22/18 1945  AST 19 17  ALT 12 13  ALKPHOS 55 55  BILITOT 0.8 1.0  PROT 8.7* 8.9*  ALBUMIN  4.0 4.2   Recent Labs  Lab 09/21/18 2330 09/22/18 1945  LIPASE 25 29   No results for input(s): AMMONIA in the last 168 hours. CBC: Recent Labs  Lab 09/21/18 2330 09/22/18 1945 09/23/18 0609  WBC 9.0 15.4* 9.8  NEUTROABS  --  10.4* 5.7  HGB 12.6 12.8 10.8*  HCT 39.0 39.8 33.1*  MCV 89.7 92.3 91.9  PLT 452* 401* 317   Cardiac Enzymes: No results for input(s): CKTOTAL, CKMB, CKMBINDEX, TROPONINI in the last 168 hours. BNP: Invalid input(s): POCBNP CBG: Recent Labs  Lab 09/23/18 1623 09/23/18 2103 09/24/18 0647 09/24/18 0712 09/24/18 1017  GLUCAP 189* 261* 39* 76 193*   D-Dimer No results for input(s): DDIMER in the last 72 hours. Hgb A1c No results for input(s): HGBA1C in the last 72 hours. Lipid Profile No results for input(s): CHOL, HDL, LDLCALC, TRIG, CHOLHDL, LDLDIRECT in the last 72 hours. Thyroid function studies No results for input(s): TSH, T4TOTAL, T3FREE, THYROIDAB in the last 72 hours.  Invalid input(s): FREET3 Anemia work up No results for input(s): VITAMINB12, FOLATE, FERRITIN, TIBC, IRON, RETICCTPCT in the last 72 hours. Urinalysis    Component Value Date/Time   COLORURINE YELLOW 09/22/2018 1940   APPEARANCEUR HAZY (A) 09/22/2018 1940   LABSPEC >1.030 (H) 09/22/2018 1940   PHURINE 5.0 09/22/2018 1940   GLUCOSEU 100 (A) 09/22/2018 1940   HGBUR TRACE (A) 09/22/2018 1940   BILIRUBINUR SMALL (A) 09/22/2018 1940   BILIRUBINUR n 07/01/2016 1139   KETONESUR 15 (A) 09/22/2018 1940   PROTEINUR 100 (A) 09/22/2018 1940   UROBILINOGEN negative (A) 07/01/2016 1139   NITRITE NEGATIVE 09/22/2018 1940   LEUKOCYTESUR MODERATE (A) 09/22/2018 1940   Sepsis Labs Invalid input(s): PROCALCITONIN,  WBC,  LACTICIDVEN Microbiology Recent Results (from the past 240 hour(s))  SARS CORONAVIRUS 2 (TAT 6-12 HRS) Nasal Swab Aptima Multi Swab     Status: None   Collection Time: 09/22/18  8:49 PM   Specimen: Aptima Multi Swab; Nasal Swab  Result Value Ref Range  Status   SARS Coronavirus 2 NEGATIVE NEGATIVE Final    Comment: (NOTE) SARS-CoV-2 target nucleic acids are NOT DETECTED. The SARS-CoV-2 RNA is generally detectable in upper and lower respiratory specimens during the acute phase of infection. Negative results do not preclude SARS-CoV-2 infection, do not rule out co-infections with other pathogens, and should not be used as the sole basis for treatment or other patient management decisions. Negative results must be combined with clinical  observations, patient history, and epidemiological information. The expected result is Negative. Fact Sheet for Patients: SugarRoll.be Fact Sheet for Healthcare Providers: https://www.woods-mathews.com/ This test is not yet approved or cleared by the [place] FDA and  has been authorized for detection and/or diagnosis of SARS-CoV-2 by FDA under an Emergency Use Authorization (EUA). This EUA will remain  in effect (meaning this test can be used) for the duration of the COVID-19 declaration under Section 56 4(b)(1) of the Act, 21 U.S.C. section 360bbb-3(b)(1), unless the authorization is terminated or revoked sooner. Performed at [facility], [place] [address] [place], [place] [address]     Please note: You were cared for by a hospitalist during your hospital stay. Once you are discharged, your primary care physician will handle any further medical issues. Please note that NO REFILLS for any discharge medications will be authorized once you are discharged, as it is imperative that you return to your primary care physician (or establish a relationship with a primary care physician if you do not have one) for your post hospital discharge needs so that they can reassess your need for medications and monitor your lab values.    Time coordinating discharge: 40 minutes  SIGNED:   Shelly Coss, MD  [facility] 09/24/2018, 11:48 AM Pager  [phone]  If 7PM-7AM, please contact night-coverage [email or web address] Password [email or web address]

## 2018-09-25 ENCOUNTER — Other Ambulatory Visit: Payer: Self-pay | Admitting: *Deleted

## 2018-09-25 ENCOUNTER — Encounter: Payer: Self-pay | Admitting: *Deleted

## 2018-09-25 NOTE — Patient Outreach (Signed)
[facility]) [place] Telephone Outreach PCP office completes Transition of Care follow up post-hospital discharge ([place]) Post-hospital discharge day # 1   09/25/2018  Courtney Goodman December 14, 1945 [id number]  11:45 am: Successful telephone outreach to [facility], 73 y/o female referred to [place] by [facility] Liaison RN CM after recent hospitalization August 25-27, 2020 for acute renal failure superimposed on CKD- stage III; patient was discharged home to self-care with home health services in place for nursing and PT.  Patient has history including, but not limited to, CKD- stage III; anemia; HTN/ HLD; COPD; DM-type II; depression/ anxiety; GERD; and CAD.  HIPAA/ identity verified with patient using two identifiers; today patient immediately states 'this is not a good time" for her to talk, as she "is just so tired," after being in the hospital.  Patient consented to speaking with me for a few minutes, and she denies pain and sounds to be in no distress throughout phone call, although she does sound tired.  Pleasant phone call.  [facility] CM services were discussed with patient, who is familiar with [facility] CM program, and was recently active with [place]; patient provides verbal consent for ongoing [facility] CM services/ involvement in her care.  Explained to patient that I was calling on behalf of her primary assigned RN CM, Brayton Layman, who has worked with patient before, and patient remembers [place].  Explained that Brayton Layman would follow up further with patient next week, after our conversation today, and patient is agreeable to this.  Patient further reports:  -- just got back from the outpatient pharmacy and confirms that she has picked up her newly prescribed insulin- patient is able to accurately verbalize new insulin name/ dosing and reports she will start taking today; denies concerns around medications; briefly discussed  need for post-hospital discharge medication review, and patient is agreeable to complete "next week"  -- has heard from home health team, states expecting home visit "soon," "probably today;" encouraged patient's active engagement/ participation in home health services.  Confirms that she has a walker and bedside commode  -- describes a "food assistance program" that has been set up for her while at hospital- states that she qualified for this program, which she does not remember the name of, and confirms that meals will be delivered to her home.  Denies concerns around obtaining food  -- has a "long list of" upcoming provider appointments- patient stated that either her family or her neighbors will assist her with transportation; states that her daughter/ primary caregiver is limited in her ability to assist- states "she stays busy."  Unfortunately, as we were in the middle of our 10 minute conversation, the phone call abruptly ended as patient was speaking- unsure if call was dropped or if patient accidentally ended call as she maneuvered phone.  Immediately returned subsequent call to patient, however, the call went straight to voice mail-- I left the patient HIPAA compliant voice message, and asked her to call me back.  Plan:  Patient will take medications as prescribed and will attend all scheduled provider appointments  Patient will promptly notify care providers for any new concerns/ issues/ problems that arise  Patient will actively participate in home health services as ordered post-hospital discharge  Will place proactive order for [facility] CSW to further assess possible need for transportation/ food resources  Will make patient's PCP aware of [place] RN CM involvement in patient's care-- will send barriers  letter  Will place successful outreach letter in mail to patient  I will update patient's primary [place] RN CM on today's successful outreach to patient and communicate  with [place] to update  [facility] CM Care Plan Problem One     Most Recent Value  Care Plan Problem One  High Risk for hospital readmission related to/ as evidenced by recent hospital visit August 25- 27, 2020 for acute renal failure/ CKD secondary to DM  Role Documenting the Problem One  Care Management [place] for Problem One  Active  [facility] Long Term Goal   Over the next 31 days, patient will not experience hospital readmission, as evidenced by patient reporting and review of EMR during [facility] RN CM outreach  [facility] Long Term Goal Start Date  09/25/18  Interventions for Problem One Long Term Goal  Discussed with patient her recent hospitalization and confirmed that she is able to accurately verablize new insulin orders and that she has obtained new insulin from outpatient pharmacy,  confirmed that patient does not have any current clinical concerns,  [facility] CM program initiated  [facility] CM Short Term Goal #1   Over the next 30 days, patient will actively participate in home health services for nursing and PT as ordered post-hospital discharge, as evidenced by patient reporitng and collaboration as indicated with home health team, as indicated, during [facility] RN CM outreach  [facility] CM Short Term Goal #1 Start Date  09/25/18  Interventions for Short Term Goal #1  Confirmed that patient has received outreach call from home health team and that she expects a home visit soon,  encouraged patient's active participation in home health services and discussed difference between home health and [facility] CM services  [facility] CM Short Term Goal #2   Over the next 30 days, patient will attend all scheduled provider appointments as evidenced by patient reporting and collaboration with patient's care providers as indicated during [facility] RN CM outreach  [facility] CM Short Term Goal #2 Start Date  09/25/18  Interventions for Short Term Goal #2  Discussed patient's upcoming scheduled provider appointments and confirmed that  she usually obtains transportation to provider appointments from family/ neighbors,  placed [facility] CSW referral to further assess transportation needs, as call to patient was abruptly disconnected and patient did not respond to subsequent outreach attempt     Oneta Rack, RN, BSN, [employer] Coordinator [facility] Care Management  [phone]

## 2018-09-26 DIAGNOSIS — N183 Chronic kidney disease, stage 3 (moderate): Secondary | ICD-10-CM | POA: Diagnosis not present

## 2018-09-26 DIAGNOSIS — I499 Cardiac arrhythmia, unspecified: Secondary | ICD-10-CM | POA: Diagnosis not present

## 2018-09-26 DIAGNOSIS — F418 Other specified anxiety disorders: Secondary | ICD-10-CM | POA: Diagnosis not present

## 2018-09-26 DIAGNOSIS — E782 Mixed hyperlipidemia: Secondary | ICD-10-CM | POA: Diagnosis not present

## 2018-09-26 DIAGNOSIS — E1122 Type 2 diabetes mellitus with diabetic chronic kidney disease: Secondary | ICD-10-CM | POA: Diagnosis not present

## 2018-09-26 DIAGNOSIS — D631 Anemia in chronic kidney disease: Secondary | ICD-10-CM | POA: Diagnosis not present

## 2018-09-26 DIAGNOSIS — I10 Essential (primary) hypertension: Secondary | ICD-10-CM | POA: Diagnosis not present

## 2018-09-26 DIAGNOSIS — I2510 Atherosclerotic heart disease of native coronary artery without angina pectoris: Secondary | ICD-10-CM | POA: Diagnosis not present

## 2018-09-26 DIAGNOSIS — J449 Chronic obstructive pulmonary disease, unspecified: Secondary | ICD-10-CM | POA: Diagnosis not present

## 2018-09-27 ENCOUNTER — Other Ambulatory Visit: Payer: Self-pay | Admitting: Family Medicine

## 2018-09-28 ENCOUNTER — Other Ambulatory Visit: Payer: Self-pay

## 2018-09-28 DIAGNOSIS — D631 Anemia in chronic kidney disease: Secondary | ICD-10-CM | POA: Diagnosis not present

## 2018-09-28 DIAGNOSIS — F418 Other specified anxiety disorders: Secondary | ICD-10-CM | POA: Diagnosis not present

## 2018-09-28 DIAGNOSIS — E1122 Type 2 diabetes mellitus with diabetic chronic kidney disease: Secondary | ICD-10-CM | POA: Diagnosis not present

## 2018-09-28 DIAGNOSIS — N183 Chronic kidney disease, stage 3 (moderate): Secondary | ICD-10-CM | POA: Diagnosis not present

## 2018-09-28 DIAGNOSIS — I499 Cardiac arrhythmia, unspecified: Secondary | ICD-10-CM | POA: Diagnosis not present

## 2018-09-28 DIAGNOSIS — I10 Essential (primary) hypertension: Secondary | ICD-10-CM | POA: Diagnosis not present

## 2018-09-28 DIAGNOSIS — E782 Mixed hyperlipidemia: Secondary | ICD-10-CM | POA: Diagnosis not present

## 2018-09-28 DIAGNOSIS — I2510 Atherosclerotic heart disease of native coronary artery without angina pectoris: Secondary | ICD-10-CM | POA: Diagnosis not present

## 2018-09-28 DIAGNOSIS — J449 Chronic obstructive pulmonary disease, unspecified: Secondary | ICD-10-CM | POA: Diagnosis not present

## 2018-09-28 NOTE — Patient Outreach (Signed)
[facility]) Care Management  09/28/2018  Courtney Goodman 01/14/1946 [id number]  Social work referral received from [employer], [facility].  "Please refer to [place] team to further assess for transportation/ food resources- patient lives alone and described limited support post recent hospitalization" Successful outreach to patient today.   Patient is familiar with using Medicaid transportation in case family or friends are not able to assist to MD appointments.  She did ask for the number for Medicaid transportation which was provided. Per patient, she was told while in the hospital that she was eligible for a food assistance program but was not sure if she was referred and states that she has not received any meals.  Message sent to [employer] intake to determine if referral was placed. Will follow up with patient when response is received and further assess need for other resources.  [name], BSW Social Worker [phone]

## 2018-09-29 ENCOUNTER — Other Ambulatory Visit: Payer: Self-pay

## 2018-09-29 DIAGNOSIS — F418 Other specified anxiety disorders: Secondary | ICD-10-CM | POA: Diagnosis not present

## 2018-09-29 DIAGNOSIS — E782 Mixed hyperlipidemia: Secondary | ICD-10-CM | POA: Diagnosis not present

## 2018-09-29 DIAGNOSIS — E1122 Type 2 diabetes mellitus with diabetic chronic kidney disease: Secondary | ICD-10-CM | POA: Diagnosis not present

## 2018-09-29 DIAGNOSIS — I499 Cardiac arrhythmia, unspecified: Secondary | ICD-10-CM | POA: Diagnosis not present

## 2018-09-29 DIAGNOSIS — J449 Chronic obstructive pulmonary disease, unspecified: Secondary | ICD-10-CM | POA: Diagnosis not present

## 2018-09-29 DIAGNOSIS — N183 Chronic kidney disease, stage 3 (moderate): Secondary | ICD-10-CM | POA: Diagnosis not present

## 2018-09-29 DIAGNOSIS — D631 Anemia in chronic kidney disease: Secondary | ICD-10-CM | POA: Diagnosis not present

## 2018-09-29 DIAGNOSIS — I10 Essential (primary) hypertension: Secondary | ICD-10-CM | POA: Diagnosis not present

## 2018-09-29 DIAGNOSIS — I2510 Atherosclerotic heart disease of native coronary artery without angina pectoris: Secondary | ICD-10-CM | POA: Diagnosis not present

## 2018-09-29 NOTE — Patient Outreach (Addendum)
[facility]) Care Management  09/29/2018  Courtney Goodman 1945-02-20 [id number]  Unsuccessful follow up call to patient today.  Left voicemail message.  Will attempt to reach again within four business days if no return call.    Addendum:  Received return call from patient.  Informed her that she should receive delivered meals via [place] on 09/30/18.   Inquired about need for additional resources including financial, housing, and transportation.  Patient denied need for additional resources at this time and stated that she has daughter that provides support.  Closing social work case but did encourage her to call if additional needs arise    Geographical information systems officer, Systems developer [phone]

## 2018-10-01 ENCOUNTER — Telehealth: Payer: Self-pay | Admitting: Endocrinology

## 2018-10-01 ENCOUNTER — Other Ambulatory Visit: Payer: Self-pay | Admitting: *Deleted

## 2018-10-01 ENCOUNTER — Ambulatory Visit: Payer: Medicare HMO

## 2018-10-01 DIAGNOSIS — F418 Other specified anxiety disorders: Secondary | ICD-10-CM | POA: Diagnosis not present

## 2018-10-01 DIAGNOSIS — N183 Chronic kidney disease, stage 3 (moderate): Secondary | ICD-10-CM | POA: Diagnosis not present

## 2018-10-01 DIAGNOSIS — J449 Chronic obstructive pulmonary disease, unspecified: Secondary | ICD-10-CM | POA: Diagnosis not present

## 2018-10-01 DIAGNOSIS — E1122 Type 2 diabetes mellitus with diabetic chronic kidney disease: Secondary | ICD-10-CM | POA: Diagnosis not present

## 2018-10-01 DIAGNOSIS — I10 Essential (primary) hypertension: Secondary | ICD-10-CM | POA: Diagnosis not present

## 2018-10-01 DIAGNOSIS — E782 Mixed hyperlipidemia: Secondary | ICD-10-CM | POA: Diagnosis not present

## 2018-10-01 DIAGNOSIS — I499 Cardiac arrhythmia, unspecified: Secondary | ICD-10-CM | POA: Diagnosis not present

## 2018-10-01 DIAGNOSIS — D631 Anemia in chronic kidney disease: Secondary | ICD-10-CM | POA: Diagnosis not present

## 2018-10-01 DIAGNOSIS — I2510 Atherosclerotic heart disease of native coronary artery without angina pectoris: Secondary | ICD-10-CM | POA: Diagnosis not present

## 2018-10-01 NOTE — Telephone Encounter (Signed)
Levada Dy, RN from [facility] called in regards to the patients blood sugar being 511 this morning, states she took her levemir and it is now 273  [facility] # [phone]  Please Advise, Thanks

## 2018-10-01 NOTE — Patient Outreach (Signed)
[facility]) Care Management  10/01/2018  Courtney Goodman May 28, 1945 [id number]   Call placed to member to follow up on diabetes management.  She report not feeling well today.  State she is weak/tired, blood sugar was over 500 today. Report taking her Insulin, 15 units of Levemir, have no rechecked it yet.  State she has had a decreased appetite, did not eat prior to bed last night.  Has home health for PT and nursing, report nurse will be visiting soon.  Will discuss concerns and blood sugar levels with her when she arrives.    State she feel she would benefit from having someone come in more often to help with ADL's, especially when she is feeling weak.  Inquired about Mediciad, state she does have.  She is also concerned about living in her current locations, would like to move away from her grandson as she is concerned for her safety.  Does not feel she is in any immediate danger at this time, but state he has the potential to become violent.    She denies any urgent concerns at this time, will discuss concerns for safety/relocation and PCS with Education officer, museum.  Will follow up with member within the next week.  Advised to seek emergency medical attention if needed.  [name], [place], MSN [place] [phone]

## 2018-10-02 ENCOUNTER — Ambulatory Visit: Payer: Medicare HMO | Admitting: Podiatry

## 2018-10-02 ENCOUNTER — Telehealth: Payer: Self-pay | Admitting: Family Medicine

## 2018-10-02 NOTE — Telephone Encounter (Signed)
Courtney Goodman with [facility] [phone]  Called to inform you pt had a missed home visit today

## 2018-10-02 NOTE — Telephone Encounter (Signed)
please contact patient: Please continue the same victoza Take an extra 15 units of levemir now. Tomorrow am start levemir at 30 units qam F/u ov next week

## 2018-10-02 NOTE — Telephone Encounter (Signed)
I need to very levemir dosage

## 2018-10-02 NOTE — Telephone Encounter (Signed)
Called pt and informed of new orders. Verbalized acceptance and understanding. Scheduled to be seen 10/06/18.

## 2018-10-02 NOTE — Telephone Encounter (Signed)
Please advise 

## 2018-10-02 NOTE — Telephone Encounter (Signed)
Called pt to verify diabetic medications. States she takes Levemir 15 units qd and Victoza 1.8mg  qd. Please advise

## 2018-10-03 ENCOUNTER — Other Ambulatory Visit: Payer: Self-pay

## 2018-10-03 ENCOUNTER — Emergency Department (HOSPITAL_COMMUNITY)
Admission: EM | Admit: 2018-10-03 | Discharge: 2018-10-04 | Disposition: A | Discharge status: Home / Self Care | Payer: Medicare HMO | Attending: Emergency Medicine | Admitting: Emergency Medicine

## 2018-10-03 DIAGNOSIS — Z79899 Other long term (current) drug therapy: Secondary | ICD-10-CM | POA: Insufficient documentation

## 2018-10-03 DIAGNOSIS — R42 Dizziness and giddiness: Secondary | ICD-10-CM | POA: Diagnosis not present

## 2018-10-03 DIAGNOSIS — I2510 Atherosclerotic heart disease of native coronary artery without angina pectoris: Secondary | ICD-10-CM | POA: Diagnosis not present

## 2018-10-03 DIAGNOSIS — R739 Hyperglycemia, unspecified: Secondary | ICD-10-CM

## 2018-10-03 DIAGNOSIS — Z794 Long term (current) use of insulin: Secondary | ICD-10-CM | POA: Diagnosis not present

## 2018-10-03 DIAGNOSIS — E1165 Type 2 diabetes mellitus with hyperglycemia: Secondary | ICD-10-CM | POA: Diagnosis not present

## 2018-10-03 DIAGNOSIS — J449 Chronic obstructive pulmonary disease, unspecified: Secondary | ICD-10-CM | POA: Diagnosis not present

## 2018-10-03 DIAGNOSIS — D631 Anemia in chronic kidney disease: Secondary | ICD-10-CM | POA: Diagnosis not present

## 2018-10-03 DIAGNOSIS — I129 Hypertensive chronic kidney disease with stage 1 through stage 4 chronic kidney disease, or unspecified chronic kidney disease: Secondary | ICD-10-CM | POA: Diagnosis not present

## 2018-10-03 DIAGNOSIS — F418 Other specified anxiety disorders: Secondary | ICD-10-CM | POA: Diagnosis not present

## 2018-10-03 DIAGNOSIS — I10 Essential (primary) hypertension: Secondary | ICD-10-CM | POA: Diagnosis not present

## 2018-10-03 DIAGNOSIS — N183 Chronic kidney disease, stage 3 (moderate): Secondary | ICD-10-CM | POA: Insufficient documentation

## 2018-10-03 DIAGNOSIS — E782 Mixed hyperlipidemia: Secondary | ICD-10-CM | POA: Diagnosis not present

## 2018-10-03 DIAGNOSIS — Z87891 Personal history of nicotine dependence: Secondary | ICD-10-CM | POA: Diagnosis not present

## 2018-10-03 DIAGNOSIS — I499 Cardiac arrhythmia, unspecified: Secondary | ICD-10-CM | POA: Diagnosis not present

## 2018-10-03 DIAGNOSIS — E1122 Type 2 diabetes mellitus with diabetic chronic kidney disease: Secondary | ICD-10-CM | POA: Diagnosis not present

## 2018-10-03 LAB — URINALYSIS, ROUTINE W REFLEX MICROSCOPIC
Bilirubin Urine: NEGATIVE
Glucose, UA: >=500 mg/dL — AB
Hgb urine dipstick: NEGATIVE
Ketones, ur: NEGATIVE mg/dL
Nitrite: NEGATIVE
Protein, ur: 30 mg/dL — AB
Specific Gravity, Urine: 1.02 (ref 1.005–1.030)
pH: 5 (ref 5.0–8.0)

## 2018-10-03 LAB — BASIC METABOLIC PANEL
Anion gap: 12 (ref 5–15)
BUN: 41 mg/dL — ABNORMAL HIGH (ref 8–23)
CO2: 21 mmol/L — ABNORMAL LOW (ref 22–32)
Calcium: 9.7 mg/dL (ref 8.9–10.3)
Chloride: 97 mmol/L — ABNORMAL LOW (ref 98–111)
Creatinine, Ser: 2.21 mg/dL — ABNORMAL HIGH (ref 0.44–1.00)
GFR calc Af Amer: 25 mL/min — ABNORMAL LOW (ref >60)
GFR calc non Af Amer: 21 mL/min — ABNORMAL LOW (ref >60)
Glucose, Bld: 438 mg/dL — ABNORMAL HIGH (ref 70–99)
Potassium: 4.2 mmol/L (ref 3.5–5.1)
Sodium: 130 mmol/L — ABNORMAL LOW (ref 135–145)

## 2018-10-03 LAB — CBC
HCT: 34.7 % — ABNORMAL LOW (ref 36.0–46.0)
Hemoglobin: 11.1 g/dL — ABNORMAL LOW (ref 12.0–15.0)
MCH: 29.4 pg (ref 26.0–34.0)
MCHC: 32 g/dL (ref 30.0–36.0)
MCV: 92 fL (ref 80.0–100.0)
Platelets: 312 10*3/uL (ref 150–400)
RBC: 3.77 MIL/uL — ABNORMAL LOW (ref 3.87–5.11)
RDW: 15 % (ref 11.5–15.5)
WBC: 10.6 10*3/uL — ABNORMAL HIGH (ref 4.0–10.5)
nRBC: 0 % (ref 0.0–0.2)

## 2018-10-03 LAB — POCT I-STAT 7, (LYTES, BLD GAS, ICA,H+H)
Acid-Base Excess: 1 mmol/L (ref 0.0–2.0)
Bicarbonate: 23.6 mmol/L (ref 20.0–28.0)
Calcium, Ion: 1.17 mmol/L (ref 1.15–1.40)
HCT: 35 % — ABNORMAL LOW (ref 36.0–46.0)
Hemoglobin: 11.9 g/dL — ABNORMAL LOW (ref 12.0–15.0)
O2 Saturation: 99 %
Potassium: 4.1 mmol/L (ref 3.5–5.1)
Sodium: 132 mmol/L — ABNORMAL LOW (ref 135–145)
TCO2: 24 mmol/L (ref 22–32)
pCO2 arterial: 29.1 mmHg — ABNORMAL LOW (ref 32.0–48.0)
pH, Arterial: 7.517 — ABNORMAL HIGH (ref 7.350–7.450)
pO2, Arterial: 130 mmHg — ABNORMAL HIGH (ref 83.0–108.0)

## 2018-10-03 LAB — CBG MONITORING, ED: Glucose-Capillary: 432 mg/dL — ABNORMAL HIGH (ref 70–99)

## 2018-10-03 MED ORDER — LACTATED RINGERS IV BOLUS
1000.0000 mL | Freq: Once | INTRAVENOUS | Status: AC
  Administered 2018-10-04: 1000 mL via INTRAVENOUS

## 2018-10-03 MED ORDER — LACTATED RINGERS IV BOLUS
1000.0000 mL | Freq: Once | INTRAVENOUS | Status: AC
  Administered 2018-10-03: 1000 mL via INTRAVENOUS

## 2018-10-03 NOTE — ED Notes (Signed)
The pt reports that she was recently in the hospital with blood sugar problems today she has not been eating  C/o dizziness hx of the same.  She lives alone  She reports also that her daughter will be coming to the hospital  alet oriented skin warm and dry

## 2018-10-03 NOTE — ED Provider Notes (Signed)
[place] EMERGENCY DEPARTMENT Provider Note   CSN: [id number] Arrival date & time: 10/03/18  2126     History   Chief Complaint Chief Complaint  Patient presents with  . Hyperglycemia    HPI Courtney Goodman is a 73 y.o. female with a past medical history of COPD, CKD, DM who presents to the emergency department with hyperglycemia. Patient reports she was recently hospitalized for hypoglycemia and was discharged on a different insulin regiment. Patient reports for the last several weeks she has been intermittently feeling nauseous, had poor PO intake, and general malaise. Patient reports feeling lightheaded when ambulating. Patient reports her home health nurse noted she was hyperglycemic and was feeling poorly so she presented to the emergency department for further evaluation. Patient reports compliance with her insulin. Denies vomiting, dysuria, abdominal pain, chest pain, shortness of breath, headache, or recent falls.     The history is provided by the patient and a relative.    Past Medical History:  Diagnosis Date  . Anemia of chronic disease   . Asthma   . Atherosclerotic heart disease of native coronary artery without angina pectoris   . CKD (chronic kidney disease)   . COPD (chronic obstructive pulmonary disease) ([place])   . DM (diabetes mellitus), type 2 with renal complications ([place])   . Dysrhythmia   . Elevated ferritin 01/03/2017  . GAD (generalized anxiety disorder)   . Hypertension   . Mixed hyperlipidemia due to type 2 diabetes mellitus ([place])   . Mixed incontinence    per medical records from [place]  . Osteopenia   . Personal history of noncompliance with medical treatment, presenting hazards to health   . Shortness of breath dyspnea   . Suicidal ideation   . TB (tuberculosis), treated    age 73  . Vitamin D deficiency     Patient Active Problem List   Diagnosis Date Noted  . Acute renal failure superimposed on stage 3 chronic kidney  disease ([place]) 09/22/2018  . Leukocytosis 09/22/2018  . Non-intractable vomiting   . GERD (gastroesophageal reflux disease) 08/23/2017  . CAD (coronary artery disease) 08/23/2017  . Hypoglycemia 08/23/2017  . Acute metabolic encephalopathy 11/94/1740  . Hypokalemia 08/23/2017  . Type II diabetes mellitus with renal manifestations ([place]) 08/23/2017  . Elevated ferritin 01/03/2017  . Personal history of noncompliance with medical treatment, presenting hazards to health   . Urinary incontinence, mixed 05/27/2016  . Major depression, recurrent ([place]) 05/27/2016  . Atherosclerotic heart disease of native coronary artery without angina pectoris 05/27/2016  . GAD (generalized anxiety disorder)   . Anemia of chronic disease   . Mixed hyperlipidemia due to type 2 diabetes mellitus ([place])   . Diabetes ([place]) 11/30/2015  . Vitamin D deficiency 11/23/2015  . HTN (hypertension) 11/23/2015  . Anemia 11/23/2015  . CKD (chronic kidney disease), stage III ([place]) 11/23/2015  . Dysrhythmia   . COPD (chronic obstructive pulmonary disease) ([place])     Past Surgical History:  Procedure Laterality Date  . ANKLE RECONSTRUCTION     right  . CARPAL TUNNEL RELEASE    . CORONARY ANGIOPLASTY       OB History   No obstetric history on file.      Home Medications    Prior to Admission medications   Medication Sig Start Date End Date Taking? Authorizing Provider  albuterol (PROVENTIL HFA;VENTOLIN HFA) 108 (90 Base) MCG/ACT inhaler Inhale 2 puffs into the lungs every 6 (six) hours as needed for wheezing  or shortness of breath. 04/14/17  Yes Henson, Vickie L, NP-C  amLODipine-benazepril (LOTREL) 5-20 MG capsule Take 1 capsule by mouth daily. 09/28/18  Yes Henson, Vickie L, NP-C  aspirin EC 81 MG tablet Take 81 mg by mouth daily.    Yes [provider]  Biotin 10000 MCG TABS Take 1 tablet by mouth daily.    Yes [provider]  carvedilol (COREG) 6.25 MG tablet Take 1 tablet by mouth twice  daily. Patient taking differently: Take 6.25 mg by mouth 2 (two) times daily with a meal.  09/28/18  Yes Henson, Vickie L, NP-C  cholecalciferol (VITAMIN D) 25 MCG (1000 UT) tablet Take 1 tablet by mouth daily. Patient taking differently: Take 1,000 Units by mouth daily.  08/07/18  Yes Henson, Vickie L, NP-C  citalopram (CELEXA) 20 MG tablet Take 1 tablet (20 mg total) by mouth daily. 09/05/16  Yes Henson, Vickie L, NP-C  fenofibrate (TRICOR) 145 MG tablet Take 1 tablet (145 mg total) by mouth daily. 08/24/18  Yes Henson, Vickie L, NP-C  fluticasone (FLONASE) 50 MCG/ACT nasal spray Place 2 sprays into both nostrils daily. 07/25/16  Yes Henson, Vickie L, NP-C  folic acid (FOLVITE) 1 MG tablet Take 1 tablet (1 mg total) by mouth daily. 03/30/18  Yes Henson, Vickie L, NP-C  Insulin Detemir (LEVEMIR) 100 UNIT/ML Pen Inject 15 Units into the skin daily. 09/24/18  Yes Adhikari, Tamsen Meek, MD  liraglutide (VICTOZA) 18 MG/3ML SOPN Inject 1.8 mg into the skin daily.   Yes [provider]  omeprazole (PRILOSEC) 40 MG capsule Take 1 capsule (40 mg total) by mouth daily. 08/25/18  Yes Henson, Vickie L, NP-C  simvastatin (ZOCOR) 20 MG tablet Take 1 tablet by mouth every night. Patient taking differently: Take 20 mg by mouth daily at 6 PM.  08/24/18  Yes Henson, Vickie L, NP-C  traMADol (ULTRAM) 50 MG tablet Take 1 tablet by mouth daily as needed. Patient taking differently: Take 50 mg by mouth every 6 (six) hours as needed for moderate pain.  04/03/17  Yes Henson, Vickie L, NP-C  traZODone (DESYREL) 50 MG tablet Take 50 mg by mouth at bedtime.    Yes [provider]  Alcohol Swabs (ALCOHOL PREP) PADS Use for testing 08/07/18   Raenette Rover, Vickie L, NP-C  glucose blood (TRUE METRIX BLOOD GLUCOSE TEST) test strip Use to monitor glucose levels twice per day 03/23/18   Renato Shin, MD  Insulin Pen Needle 31G X 5 MM MISC Inject 1 each into the skin 2 (two) times daily. Use to inject insulin twice daily; E11.29 09/14/18    Renato Shin, MD  Insulin Pen Needle 31G X 5 MM MISC 1 each by Does not apply route 2 (two) times daily. 09/24/18   Shelly Coss, MD    Family History Family History  Problem Relation Age of Onset  . Diabetes Mother     Social History Social History   Tobacco Use  . Smoking status: Former Research scientist (life sciences)  . Smokeless tobacco: Never Used  Substance Use Topics  . Alcohol use: No  . Drug use: No     Allergies   Patient has no known allergies.   Review of Systems Review of Systems  Constitutional: Positive for fatigue.  HENT: Negative for congestion and trouble swallowing.   Eyes: Negative for visual disturbance.  Respiratory: Negative for cough and shortness of breath.   Cardiovascular: Negative for chest pain and leg swelling.  Gastrointestinal: Positive for nausea. Negative for abdominal pain, constipation,  diarrhea and vomiting.  Genitourinary: Negative for difficulty urinating and dysuria.  Skin: Negative for rash.  Neurological: Positive for light-headedness.  Psychiatric/Behavioral: Negative for confusion.     Physical Exam Updated Vital Signs BP (!) 162/73 (BP Location: Right Arm)   Pulse 68   Temp 98 F (36.7 C) (Oral)   Resp 19   Ht 5\' 4"  (1.626 m)   Wt 78.3 kg   SpO2 99%   BMI 29.63 kg/m   Physical Exam Constitutional:      General: She is not in acute distress.    Appearance: She is obese.  HENT:     Head: Normocephalic and atraumatic.     Right Ear: External ear normal.     Left Ear: External ear normal.     Nose: Nose normal.     Mouth/Throat:     Mouth: Mucous membranes are moist.     Pharynx: Oropharynx is clear.  Eyes:     Conjunctiva/sclera: Conjunctivae normal.     Pupils: Pupils are equal, round, and reactive to light.  Neck:     Musculoskeletal: Neck supple.  Cardiovascular:     Rate and Rhythm: Normal rate and regular rhythm.     Pulses: Normal pulses.  Pulmonary:     Effort: Pulmonary effort is normal. No respiratory distress.      Breath sounds: Normal breath sounds. No wheezing, rhonchi or rales.  Chest:     Chest wall: No tenderness.  Abdominal:     Palpations: Abdomen is soft.     Tenderness: There is no abdominal tenderness. There is no guarding or rebound.  Musculoskeletal:     Right lower leg: No edema.     Left lower leg: No edema.  Skin:    General: Skin is warm and dry.  Neurological:     General: No focal deficit present.     Mental Status: She is alert and oriented to person, place, and time.     GCS: GCS eye subscore is 4. GCS verbal subscore is 5. GCS motor subscore is 6.     Cranial Nerves: No cranial nerve deficit or dysarthria.     Sensory: No sensory deficit.     Motor: No weakness or pronator drift.     Coordination: Coordination normal. Finger-Nose-Finger Test normal.      ED Treatments / Results  Labs (all labs ordered are listed, but only abnormal results are displayed) Labs Reviewed  BASIC METABOLIC PANEL - Abnormal; Notable for the following components:      Result Value   Sodium 130 (*)    Chloride 97 (*)    CO2 21 (*)    Glucose, Bld 438 (*)    BUN 41 (*)    Creatinine, Ser 2.21 (*)    GFR calc non Af Amer 21 (*)    GFR calc Af Amer 25 (*)    All other components within normal limits  CBC - Abnormal; Notable for the following components:   WBC 10.6 (*)    RBC 3.77 (*)    Hemoglobin 11.1 (*)    HCT 34.7 (*)    All other components within normal limits  URINALYSIS, ROUTINE W REFLEX MICROSCOPIC - Abnormal; Notable for the following components:   APPearance HAZY (*)    Glucose, UA >=500 (*)    Protein, ur 30 (*)    Leukocytes,Ua TRACE (*)    Bacteria, UA RARE (*)    All other components within normal limits  CBG MONITORING,  ED - Abnormal; Notable for the following components:   Glucose-Capillary 432 (*)    All other components within normal limits  CBG MONITORING, ED - Abnormal; Notable for the following components:   Glucose-Capillary 322 (*)    All other  components within normal limits  POCT I-STAT 7, (LYTES, BLD GAS, ICA,H+H) - Abnormal; Notable for the following components:   pH, Arterial 7.517 (*)    pCO2 arterial 29.1 (*)    pO2, Arterial 130.0 (*)    Sodium 132 (*)    HCT 35.0 (*)    Hemoglobin 11.9 (*)    All other components within normal limits  CBG MONITORING, ED - Abnormal; Notable for the following components:   Glucose-Capillary 250 (*)    All other components within normal limits  BLOOD GAS, VENOUS  CBG MONITORING, ED    EKG EKG Interpretation  Date/Time:  Saturday October 03 2018 22:16:09 EDT Ventricular Rate:  70 PR Interval:    QRS Duration: 143 QT Interval:  475 QTC Calculation: 513 R Axis:   58 Text Interpretation:  Sinus rhythm Right bundle branch block Baseline wander in lead(s) V4 no significant change since July 2020 Confirmed by Sherwood Gambler [phone]) on 10/03/2018 10:32:52 PM   Radiology No results found.  Procedures Procedures (including critical care time)  Medications Ordered in ED Medications  ondansetron (ZOFRAN-ODT) disintegrating tablet 4 mg (has no administration in time range)  lactated ringers bolus 1,000 mL (0 mLs Intravenous Stopped 10/04/18 0046)  lactated ringers bolus 1,000 mL (1,000 mLs Intravenous New Bag/Given 10/04/18 0047)  insulin aspart (novoLOG) injection 5 Units (5 Units Subcutaneous Given 10/04/18 0053)     Initial Impression / Assessment and Plan / ED Course  I have reviewed the triage vital signs and the nursing notes.  Pertinent labs & imaging results that were available during my care of the patient were reviewed by me and considered in my medical decision making (see chart for details).      Initial blood glucose 432. Concern for possible DKA vs HHS vs dehydration. Laboratory studies not consistent with DKA. Labs show mild AKI. Patient given 1 L IVF. Patient likely fluid down given recent poor PO intake, AKI, and lightheadedness when ambulating. UA not consistent with  UTI. Non-focal neurologic exam and benign abdominal exam. Patient given zofran for nausea. Additional 1 L IVF ordered. Discussed findings with patient. Plan for additional IVF, nausea control, repeat blood glucose, PO challenge, and re-assess. Patient care transferred to Dr. Leonides Schanz at approximately 12:20 AM, please see her note for further details.  Patient seen and plan discussed with Dr. Regenia Skeeter.  Final Clinical Impressions(s) / ED Diagnoses   Final diagnoses:  None    ED Discharge Orders    None       Betsey Amen, MD 10/04/18 2774    Sherwood Gambler, MD 10/04/18 [phone]

## 2018-10-03 NOTE — ED Triage Notes (Signed)
Per [facility] pt from home, has not been feeling well and dizzy, recently dc'd from here a week ago, started on a new insulin. CBG 508.

## 2018-10-04 DIAGNOSIS — E1165 Type 2 diabetes mellitus with hyperglycemia: Secondary | ICD-10-CM | POA: Diagnosis not present

## 2018-10-04 LAB — CBG MONITORING, ED
Glucose-Capillary: 250 mg/dL — ABNORMAL HIGH (ref 70–99)
Glucose-Capillary: 322 mg/dL — ABNORMAL HIGH (ref 70–99)

## 2018-10-04 MED ORDER — ONDANSETRON 4 MG PO TBDP: 4.0000 mg | ORAL_TABLET | Freq: Once | ORAL | Status: DC

## 2018-10-04 MED ORDER — INSULIN ASPART 100 UNIT/ML ~~LOC~~ SOLN
5.0000 [IU] | Freq: Once | SUBCUTANEOUS | Status: AC
  Administered 2018-10-04: 5 [IU] via SUBCUTANEOUS

## 2018-10-04 NOTE — ED Provider Notes (Signed)
12:17 AM  Assumed care.  Please see previous provider's note.  Patient is a 73 year old female with history of diabetes who presents to the emergency department with hyperglycemia.  Patient not in DKA.  Recently admitted for hypoglycemia and AKI.  Creatinine still elevated but improved from previous.  Patient has received 1 L of fluid.  Plan is to give second liter of fluid, Zofran, recheck CBG and p.o. challenge.  If patient has improved clinically, plan is to discharge home.  2:10 AM  Pt reports she is still feeling weak but is feeling better.  She has been able to eat and drink here without difficulty.  Blood sugar has improved to 250.  Recommended close follow-up with her PCP for monitoring of her diabetes and renal function.  She is comfortable with this plan.  At this time, I do not feel there is any life-threatening condition present. I have reviewed and discussed all results (EKG, imaging, lab, urine as appropriate) and exam findings with patient/family. I have reviewed nursing notes and appropriate previous records.  I feel the patient is safe to be discharged home without further emergent workup and can continue workup as an outpatient as needed. Discussed usual and customary return precautions. Patient/family verbalize understanding and are comfortable with this plan.  Outpatient follow-up has been provided as needed. All questions have been answered.    Ziere Docken, Delice Bison, DO 10/04/18 0211

## 2018-10-04 NOTE — ED Notes (Signed)
Checked CBG 322, RN Chris informed

## 2018-10-04 NOTE — ED Notes (Signed)
Pt requested diet ginger ale to drink. Pt given the same. Dr Leonides Schanz wants to wait for pt CBG to come down before pt is given crackers.

## 2018-10-04 NOTE — ED Notes (Signed)
The pt does not have a wy home  Does not have anyone to call

## 2018-10-04 NOTE — ED Notes (Signed)
Checked CBG 250. RN Gerald Stabs informed

## 2018-10-04 NOTE — ED Notes (Signed)
Per Dr Leonides Schanz ok for pt to have crackers. Pt given 3 packs.

## 2018-10-04 NOTE — ED Notes (Signed)
Does not have a way home

## 2018-10-04 NOTE — Discharge Instructions (Signed)
I recommend that you follow-up closely with your primary care physician to closely monitor your diabetes as well as your kidney function.  I recommend that you check your blood sugar at home at least 4 times a day.  These continue your medications as prescribed.  I recommend that you continue to drink water to stay well-hydrated which will help with your renal function.

## 2018-10-06 ENCOUNTER — Inpatient Hospital Stay: Payer: Medicare HMO | Attending: Oncology

## 2018-10-06 ENCOUNTER — Other Ambulatory Visit: Payer: Self-pay

## 2018-10-06 ENCOUNTER — Inpatient Hospital Stay: Payer: Medicare HMO

## 2018-10-06 ENCOUNTER — Encounter: Payer: Self-pay | Admitting: *Deleted

## 2018-10-06 ENCOUNTER — Ambulatory Visit: Payer: Medicare HMO | Admitting: Endocrinology

## 2018-10-06 VITALS — BP 140/68

## 2018-10-06 DIAGNOSIS — N189 Chronic kidney disease, unspecified: Secondary | ICD-10-CM | POA: Insufficient documentation

## 2018-10-06 DIAGNOSIS — I129 Hypertensive chronic kidney disease with stage 1 through stage 4 chronic kidney disease, or unspecified chronic kidney disease: Secondary | ICD-10-CM | POA: Insufficient documentation

## 2018-10-06 DIAGNOSIS — D631 Anemia in chronic kidney disease: Secondary | ICD-10-CM | POA: Insufficient documentation

## 2018-10-06 DIAGNOSIS — N183 Chronic kidney disease, stage 3 unspecified: Secondary | ICD-10-CM

## 2018-10-06 DIAGNOSIS — D638 Anemia in other chronic diseases classified elsewhere: Secondary | ICD-10-CM

## 2018-10-06 LAB — CBC WITH DIFFERENTIAL (CANCER CENTER ONLY)
Abs Immature Granulocytes: 0.02 10*3/uL (ref 0.00–0.07)
Basophils Absolute: 0 10*3/uL (ref 0.0–0.1)
Basophils Relative: 1 %
Eosinophils Absolute: 0.1 10*3/uL (ref 0.0–0.5)
Eosinophils Relative: 2 %
HCT: 31.4 % — ABNORMAL LOW (ref 36.0–46.0)
Hemoglobin: 10.2 g/dL — ABNORMAL LOW (ref 12.0–15.0)
Immature Granulocytes: 0 %
Lymphocytes Relative: 21 %
Lymphs Abs: 1.6 10*3/uL (ref 0.7–4.0)
MCH: 29.3 pg (ref 26.0–34.0)
MCHC: 32.5 g/dL (ref 30.0–36.0)
MCV: 90.2 fL (ref 80.0–100.0)
Monocytes Absolute: 0.5 10*3/uL (ref 0.1–1.0)
Monocytes Relative: 7 %
Neutro Abs: 5.2 10*3/uL (ref 1.7–7.7)
Neutrophils Relative %: 69 %
Platelet Count: 291 10*3/uL (ref 150–400)
RBC: 3.48 MIL/uL — ABNORMAL LOW (ref 3.87–5.11)
RDW: 15.1 % (ref 11.5–15.5)
WBC Count: 7.4 10*3/uL (ref 4.0–10.5)
nRBC: 0 % (ref 0.0–0.2)

## 2018-10-06 MED ORDER — EPOETIN ALFA-EPBX 40000 UNIT/ML IJ SOLN: 40000.0000 [IU] | Freq: Once | INTRAMUSCULAR | Status: DC

## 2018-10-06 NOTE — Telephone Encounter (Signed)
This encounter was created in error - please disregard.

## 2018-10-06 NOTE — Progress Notes (Signed)
PT decided she wasn't going to receive injection today. She stated she wasn't feeling well and didn't want to wait any longer. There was also an issue with prior auth.

## 2018-10-07 ENCOUNTER — Other Ambulatory Visit: Payer: Self-pay | Admitting: *Deleted

## 2018-10-07 ENCOUNTER — Encounter: Payer: Self-pay | Admitting: Endocrinology

## 2018-10-07 ENCOUNTER — Ambulatory Visit (INDEPENDENT_AMBULATORY_CARE_PROVIDER_SITE_OTHER): Payer: Medicare HMO | Admitting: Endocrinology

## 2018-10-07 VITALS — BP 100/58 | HR 71 | Ht 64.0 in | Wt 163.8 lb

## 2018-10-07 DIAGNOSIS — E1122 Type 2 diabetes mellitus with diabetic chronic kidney disease: Secondary | ICD-10-CM

## 2018-10-07 DIAGNOSIS — Z794 Long term (current) use of insulin: Secondary | ICD-10-CM

## 2018-10-07 DIAGNOSIS — N183 Chronic kidney disease, stage 3 (moderate): Secondary | ICD-10-CM

## 2018-10-07 MED ORDER — INSULIN DETEMIR 100 UNIT/ML FLEXPEN: 60.0000 [IU] | PEN_INJECTOR | SUBCUTANEOUS | 3 refills | Status: DC

## 2018-10-07 NOTE — Patient Instructions (Addendum)
check your blood sugar twice a day.  vary the time of day when you check, between before the 3 meals, and at bedtime.  also check if you have symptoms of your blood sugar being too high or too low.  please keep a record of the readings and bring it to your next appointment here (or you can bring the meter itself).  You can write it on any piece of paper.  please call us sooner if your blood sugar goes below 70, or if you have a lot of readings over 200.   I have sent a prescription to your pharmacy, to increase levemir to 60 units each morning On this type of insulin schedule, you should eat meals on a regular schedule.  If a meal is missed or significantly delayed, your blood sugar could go low.   Until you see your primary care provider on Monday, please stop taking the benazepril/amlodipine Please come back for a follow-up appointment in 2 weeks.

## 2018-10-07 NOTE — Progress Notes (Signed)
Subjective:    Patient ID: Courtney Goodman, female    DOB: 1945/05/05, 73 y.o.   MRN: [id number]  HPI Pt returns for f/u of diabetes mellitus: DM type: Insulin-requiring type 2 Dx'ed: 7654 Complications: renal insufficiency and CAD.  Therapy: insulin since 2009, and victoza. GDM: never DKA: never Severe hypoglycemia: 2012 and 2019.   Pancreatitis: never.  Other: she is on qd insulin, due to h/o noncompliance; ins declined tresiba Interval history: She says she has not recently missed the insulin.  She went home on reduced insulin.  She called here, and it was increased to 30 units qam.  Despite the increase, she was seen back in ER with glucose in the 500's.  She is still on 30 units qd.  she brings her meter with her cbg's which I have reviewed today.  All cbg's are in the 400's.   Past Medical History:  Diagnosis Date  . Anemia of chronic disease   . Asthma   . Atherosclerotic heart disease of native coronary artery without angina pectoris   . CKD (chronic kidney disease)   . COPD (chronic obstructive pulmonary disease) ([place])   . DM (diabetes mellitus), type 2 with renal complications ([place])   . Dysrhythmia   . Elevated ferritin 01/03/2017  . GAD (generalized anxiety disorder)   . Hypertension   . Mixed hyperlipidemia due to type 2 diabetes mellitus ([place])   . Mixed incontinence    per medical records from [facility]  . Osteopenia   . Personal history of noncompliance with medical treatment, presenting hazards to health   . Shortness of breath dyspnea   . Suicidal ideation   . TB (tuberculosis), treated    age 28  . Vitamin D deficiency     Past Surgical History:  Procedure Laterality Date  . ANKLE RECONSTRUCTION     right  . CARPAL TUNNEL RELEASE    . CORONARY ANGIOPLASTY      Social History   Socioeconomic History  . Marital status: Divorced    Spouse name: Not on file  . Number of children: 2  . Years of education: 78  . Highest education level: High school  graduate  Occupational History  . Not on file  Social Needs  . Financial resource strain: Somewhat hard  . Food insecurity    Worry: Sometimes true    Inability: Sometimes true  . Transportation needs    Medical: No    Non-medical: No  Tobacco Use  . Smoking status: Former Research scientist (life sciences)  . Smokeless tobacco: Never Used  Substance and Sexual Activity  . Alcohol use: No  . Drug use: No  . Sexual activity: Never  Lifestyle  . Physical activity    Days per week: 0 days    Minutes per session: 0 min  . Stress: Very much  Relationships  . Social connections    Talks on phone: More than three times a week    Gets together: More than three times a week    Attends religious service: More than 4 times per year    Active member of club or organization: No    Attends meetings of clubs or organizations: Never    Relationship status: Divorced  . Intimate partner violence    Fear of current or ex partner: No    Emotionally abused: No    Physically abused: No    Forced sexual activity: No  Other Topics Concern  . Not on file  Social History Narrative  .  Not on file    Current Outpatient Medications on File Prior to Visit  Medication Sig Dispense Refill  . albuterol (PROVENTIL HFA;VENTOLIN HFA) 108 (90 Base) MCG/ACT inhaler Inhale 2 puffs into the lungs every 6 (six) hours as needed for wheezing or shortness of breath. 1 Inhaler 0  . Alcohol Swabs (ALCOHOL PREP) PADS Use for testing 100 each 1  . aspirin EC 81 MG tablet Take 81 mg by mouth daily.     . Biotin 10000 MCG TABS Take 1 tablet by mouth daily.     . carvedilol (COREG) 6.25 MG tablet Take 1 tablet by mouth twice daily. (Patient taking differently: Take 6.25 mg by mouth 2 (two) times daily with a meal. ) 90 tablet 0  . cholecalciferol (VITAMIN D) 25 MCG (1000 UT) tablet Take 1 tablet by mouth daily. (Patient taking differently: Take 1,000 Units by mouth daily. ) 90 tablet 0  . citalopram (CELEXA) 20 MG tablet Take 1 tablet (20 mg  total) by mouth daily. 90 tablet 1  . fenofibrate (TRICOR) 145 MG tablet Take 1 tablet (145 mg total) by mouth daily. 90 tablet 0  . fluticasone (FLONASE) 50 MCG/ACT nasal spray Place 2 sprays into both nostrils daily. 48 g 0  . folic acid (FOLVITE) 1 MG tablet Take 1 tablet (1 mg total) by mouth daily. 90 tablet 0  . glucose blood (TRUE METRIX BLOOD GLUCOSE TEST) test strip Use to monitor glucose levels twice per day 100 each 12  . Insulin Pen Needle 31G X 5 MM MISC Inject 1 each into the skin 2 (two) times daily. Use to inject insulin twice daily; E11.29 100 each 1  . Insulin Pen Needle 31G X 5 MM MISC 1 each by Does not apply route 2 (two) times daily. 100 each 0  . liraglutide (VICTOZA) 18 MG/3ML SOPN Inject 1.8 mg into the skin daily.    Marland Kitchen omeprazole (PRILOSEC) 40 MG capsule Take 1 capsule (40 mg total) by mouth daily. 30 capsule 0  . simvastatin (ZOCOR) 20 MG tablet Take 1 tablet by mouth every night. (Patient taking differently: Take 20 mg by mouth daily at 6 PM. ) 90 tablet 0  . traMADol (ULTRAM) 50 MG tablet Take 1 tablet by mouth daily as needed. (Patient taking differently: Take 50 mg by mouth every 6 (six) hours as needed for moderate pain. ) 90 tablet 0  . traZODone (DESYREL) 50 MG tablet Take 50 mg by mouth at bedtime.      No current facility-administered medications on file prior to visit.     No Known Allergies  Family History  Problem Relation Age of Onset  . Diabetes Mother     BP (!) 100/58 (BP Location: Right Arm, Patient Position: Sitting, Cuff Size: Normal)   Pulse 71   Ht 5\' 4"  (1.626 m)   Wt 163 lb 12.8 oz (74.3 kg)   SpO2 98%   BMI 28.12 kg/m   Review of Systems She has lost weight.  Denies n/v.      Objective:   Physical Exam VITAL SIGNS:  See vs page GENERAL: no distress Pulses: dorsalis pedis intact bilat.   MSK: no deformity of the feet.  CV: no leg edema.   Skin:  no ulcer on the feet, but she skin of the feet is dry.  normal color and temp on  the feet. Neuro: sensation is intact to touch on the feet Ext: there is bilateral onychomycosis of the toenails   Lab  Results  Component Value Date   HGBA1C 9.8 (A) 09/15/2018   Lab Results  Component Value Date   CREATININE 2.21 (H) 10/03/2018   BUN 41 (H) 10/03/2018   NA 132 (L) 10/03/2018   K 4.1 10/03/2018   CL 97 (L) 10/03/2018   CO2 21 (L) 10/03/2018      Assessment & Plan:  HTN: overcontrolled, prob due to weight loss. Insulin-requiring type 2 DM, with renal failure: worse  Patient Instructions  check your blood sugar twice a day.  vary the time of day when you check, between before the 3 meals, and at bedtime.  also check if you have symptoms of your blood sugar being too high or too low.  please keep a record of the readings and bring it to your next appointment here (or you can bring the meter itself).  You can write it on any piece of paper.  please call us sooner if your blood sugar goes below 70, or if you have a lot of readings over 200.   I have sent a prescription to your pharmacy, to increase levemir to 60 units each morning On this type of insulin schedule, you should eat meals on a regular schedule.  If a meal is missed or significantly delayed, your blood sugar could go low.   Until you see your primary care provider on Monday, please stop taking the benazepril/amlodipine Please come back for a follow-up appointment in 2 weeks.

## 2018-10-07 NOTE — Patient Outreach (Signed)
[facility]) Care Management  10/07/2018  Courtney Goodman [birth year] [id number]   Noted that member was seen in the ED for hyperglycemia on 9/5.  Call placed to member, no answer.  HIPAA compliant voice message left, will follow up within the nexft 3-4 business days.  [name], [place], MSN [place] [phone]

## 2018-10-08 ENCOUNTER — Encounter: Payer: Self-pay | Admitting: *Deleted

## 2018-10-08 ENCOUNTER — Other Ambulatory Visit: Payer: Self-pay | Admitting: *Deleted

## 2018-10-08 ENCOUNTER — Other Ambulatory Visit: Payer: Self-pay

## 2018-10-08 ENCOUNTER — Ambulatory Visit: Payer: Medicare HMO | Admitting: *Deleted
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
## 2018-10-09 ENCOUNTER — Other Ambulatory Visit: Payer: Self-pay

## 2018-10-09 ENCOUNTER — Telehealth: Payer: Self-pay | Admitting: Endocrinology

## 2018-10-09 ENCOUNTER — Other Ambulatory Visit: Payer: Self-pay | Admitting: *Deleted

## 2018-10-09 ENCOUNTER — Telehealth: Payer: Self-pay | Admitting: Family Medicine

## 2018-10-09 DIAGNOSIS — I2510 Atherosclerotic heart disease of native coronary artery without angina pectoris: Secondary | ICD-10-CM | POA: Diagnosis not present

## 2018-10-09 DIAGNOSIS — F418 Other specified anxiety disorders: Secondary | ICD-10-CM | POA: Diagnosis not present

## 2018-10-09 DIAGNOSIS — I499 Cardiac arrhythmia, unspecified: Secondary | ICD-10-CM | POA: Diagnosis not present

## 2018-10-09 DIAGNOSIS — J449 Chronic obstructive pulmonary disease, unspecified: Secondary | ICD-10-CM | POA: Diagnosis not present

## 2018-10-09 DIAGNOSIS — E1122 Type 2 diabetes mellitus with diabetic chronic kidney disease: Secondary | ICD-10-CM | POA: Diagnosis not present

## 2018-10-09 DIAGNOSIS — E782 Mixed hyperlipidemia: Secondary | ICD-10-CM | POA: Diagnosis not present

## 2018-10-09 DIAGNOSIS — D631 Anemia in chronic kidney disease: Secondary | ICD-10-CM | POA: Diagnosis not present

## 2018-10-09 DIAGNOSIS — I10 Essential (primary) hypertension: Secondary | ICD-10-CM | POA: Diagnosis not present

## 2018-10-09 DIAGNOSIS — N183 Chronic kidney disease, stage 3 (moderate): Secondary | ICD-10-CM | POA: Diagnosis not present

## 2018-10-09 NOTE — Patient Outreach (Signed)
[facility]) Care Management  09/22/2018 Late entry   Courtney Goodman [birth year] [id number]   [place] closure. Patient was admitted to the hospital.  [place] Management [phone]

## 2018-10-09 NOTE — Patient Outreach (Signed)
[name]) Care Management  10/09/2018  Courtney Goodman 1945/12/03 [id number]   [place] and receipt of request for PCS was confirmed.  This will be passed on the NP, [name], for completion at patient's upcoming appointment on Monday, 10/12/18.   [name], BSW Social Worker [phone]

## 2018-10-09 NOTE — Telephone Encounter (Signed)
Ok, please increase levemir to 70 units each morning

## 2018-10-09 NOTE — Telephone Encounter (Signed)
Please advise 

## 2018-10-09 NOTE — Telephone Encounter (Signed)
Recv'd fax from [facility] requesting refill Tramadol 50mg 

## 2018-10-09 NOTE — Telephone Encounter (Signed)
Patients daughter called stating since the increase of the dosage her sugars yesterday where 300 and this morning was 283. She is starting to feel better but is still woozy,  Please Advise, Thanks

## 2018-10-10 DIAGNOSIS — N183 Chronic kidney disease, stage 3 (moderate): Secondary | ICD-10-CM | POA: Diagnosis not present

## 2018-10-10 DIAGNOSIS — E1122 Type 2 diabetes mellitus with diabetic chronic kidney disease: Secondary | ICD-10-CM | POA: Diagnosis not present

## 2018-10-10 DIAGNOSIS — I10 Essential (primary) hypertension: Secondary | ICD-10-CM | POA: Diagnosis not present

## 2018-10-10 DIAGNOSIS — I499 Cardiac arrhythmia, unspecified: Secondary | ICD-10-CM | POA: Diagnosis not present

## 2018-10-10 DIAGNOSIS — I2510 Atherosclerotic heart disease of native coronary artery without angina pectoris: Secondary | ICD-10-CM | POA: Diagnosis not present

## 2018-10-10 DIAGNOSIS — D631 Anemia in chronic kidney disease: Secondary | ICD-10-CM | POA: Diagnosis not present

## 2018-10-10 DIAGNOSIS — J449 Chronic obstructive pulmonary disease, unspecified: Secondary | ICD-10-CM | POA: Diagnosis not present

## 2018-10-10 DIAGNOSIS — F418 Other specified anxiety disorders: Secondary | ICD-10-CM | POA: Diagnosis not present

## 2018-10-10 DIAGNOSIS — E782 Mixed hyperlipidemia: Secondary | ICD-10-CM | POA: Diagnosis not present

## 2018-10-10 NOTE — Telephone Encounter (Signed)
Please find out what she is needing this for and when she last took Tramadol. I have not prescribed this for her since 2019. Has someone else prescribed this medication for her lately?

## 2018-10-11 NOTE — Patient Instructions (Addendum)
  Ms. Abate , Thank you for taking time to come for your Medicare Wellness Visit. I appreciate your ongoing commitment to your health goals. Please review the following plan we discussed and let me know if I can assist you in the future.   These are the goals we discussed:  See your psychiatrist as soon as possible.   You deny thoughts of hurting yourself today but if this changes, call 911.   Home health should be contacting you to come out to your home to help you with your medications.   Follow up with Dr. Loanne Drilling next week as recommended.   Follow up with me next week.    This is a list of the screening recommended for you and due dates:  Health Maintenance  Topic Date Due  . Urine Protein Check  02/05/1955  . Tetanus Vaccine  02/05/1964  . Eye exam for diabetics  09/08/2014  . Colon Cancer Screening  03/05/2016  . Flu Shot  10/13/2018*  . Complete foot exam   03/04/2019  . Hemoglobin A1C  03/18/2019  . Mammogram  08/31/2020  . DEXA scan (bone density measurement)  Completed  .  Hepatitis C: One time screening is recommended by Center for Disease Control  (CDC) for  adults born from [birth year] through 1965.   Completed  . Pneumonia vaccines  Completed  *Topic was postponed. The date shown is not the original due date.

## 2018-10-11 NOTE — Progress Notes (Signed)
Courtney Goodman is a 73 y.o. female who presents for annual wellness visit, CPE and follow-up on chronic medical conditions.  She has the following concerns:  States she is having trouble with her memory, mood and is not taking her medication correctly. She also reoports not eating well.   She is using pill pak but states she still gets confused.  Did not take her medication today.  States she forgets things around her house and when she goes shopping.   She reports getting meals delivered to her house. 3 meals per day.   States she is home alone during the day and she spends her time reading and doing puzzles.  Lives with daughter [name]. She is there at night time and works all day.  Her other daughter [name] comes by to see her occasionally. States she relies on her daughter [name] to help with medical questions. States she gives [name] her money and "she gets me what I need". She does not want to move out of her house to assisted living.   States she has a physical therapist who comes to her house 1-2 times per week. Using a walker in her house. Did not bring it in today.   Reports feeling severely depressed but denies suicidal thoughts. Denies visual or auditory hallucinations.    Diabetes- recent ED visits for hyperglycemia. Seeing Dr. Loanne Drilling for this.    Mini-Cog - 10/12/18 0922    Normal clock drawing test?  no    How many words correct?  1        Immunization History  Administered Date(s) Administered  . Influenza, High Dose Seasonal PF 11/23/2015, 01/03/2017  . Pneumococcal Conjugate-13 04/18/2016  . Pneumococcal Polysaccharide-23 05/07/2017   Last Pap smear: hysterectomy Last mammogram: 09/01/2018 Last colonoscopy: 2008. Ordered cologuard but this expired.  Last DEXA: 08/08/2017- osteopenia  Dentist: never Ophtho: Dr. Katy Fitch Exercise: walks a little everyday  Other doctors caring for patient include: Dr. Loanne Drilling - endocrinologist  Dr. Julien Nordmann- hematologist - cancer  center Dr. Silvio Clayman- [facility]  Nephrologist     Depression screen:  See questionnaire below.  Depression screen [facility] 2/9 10/12/2018 10/08/2018 10/01/2018 06/04/2018 06/03/2018  Decreased Interest 3 0 0 1 0  Down, Depressed, Hopeless 3 1 1  0 0  PHQ - 2 Score 6 1 1 1  0  Altered sleeping 3 - - - -  Tired, decreased energy 3 - - - -  Change in appetite 3 - - - -  Feeling bad or failure about yourself  3 - - - -  Trouble concentrating 3 - - - -  Moving slowly or fidgety/restless 3 - - - -  Suicidal thoughts 0 - - - -  PHQ-9 Score 24 - - - -  Difficult doing work/chores Very difficult - - - -  Some recent data might be hidden    Fall Risk Screen: see questionnaire below. Fall Risk  10/12/2018 10/08/2018 10/02/2018 06/04/2018 06/03/2018  Falls in the past year? 0 0 0 0 0  Number falls in past yr: 0 0 0 0 -  Injury with Fall? 0 0 0 0 -  Risk Factor Category  - - - - -  Risk for fall due to : - Impaired balance/gait;Impaired mobility - - -  Follow up - Education provided;Falls prevention discussed - - -    ADL screen:  See questionnaire below Functional Status Survey: Is the patient deaf or have difficulty hearing?: Yes Does the patient  have difficulty seeing, even when wearing glasses/contacts?: Yes Does the patient have difficulty concentrating, remembering, or making decisions?: Yes Does the patient have difficulty walking or climbing stairs?: Yes Does the patient have difficulty dressing or bathing?: Yes Does the patient have difficulty doing errands alone such as visiting a doctor's office or shopping?: Yes   End of Life Discussion:  Patient is not interested in discussing advance directives today. She has a MOST form on file but declines to review.   Review of Systems Constitutional: -fever, -chills, -sweats, -unexpected weight change, -anorexia, -fatigue Allergy: -sneezing, -itching, -congestion Dermatology: denies changing moles, rash, lumps, new worrisome  lesions ENT: -runny nose, -ear pain, -sore throat, -hoarseness, -sinus pain, -teeth pain, -tinnitus, -hearing loss, -epistaxis Cardiology:  -chest pain, -palpitations, -edema, -orthopnea, -paroxysmal nocturnal dyspnea Respiratory: -cough, -shortness of breath, -dyspnea on exertion, -wheezing, -hemoptysis Gastroenterology: -abdominal pain, -nausea, -vomiting, -diarrhea, -constipation, -blood in stool, -changes in bowel movement, -dysphagia Hematology: -bleeding or bruising problems Musculoskeletal: -arthralgias, -myalgias, -joint swelling, -back pain, -neck pain, -cramping, -gait changes Ophthalmology: -vision changes, -eye redness, -itching, -discharge Urology: -dysuria, -difficulty urinating, -hematuria, -urinary frequency, -urgency, incontinence Neurology: -headache, -weakness, -tingling, -numbness, -speech abnormality, +memory loss, -falls, -dizziness Psychology:  +depressed mood, -agitation, -sleep problems    PHYSICAL EXAM:  BP (!) 110/58   Pulse 86   Temp 98.1 F (36.7 C)   Ht 5\' 4"  (1.626 m)   Wt 164 lb 3.2 oz (74.5 kg)   BMI 28.18 kg/m   General Appearance: Alert, cooperative, no distress, appears stated age Head: Normocephalic, without obvious abnormality, atraumatic Eyes: PERRL, conjunctiva/corneas clear, EOM's intact Ears: Normal TM's and external ear canals Nose: mask in place  Throat: mask in place  Neck: Supple, no lymphadenopathy; thyroid: no enlargement/tenderness/nodules Back: Spine nontender, no curvature, ROM normal, no CVA tenderness Lungs: Clear to auscultation bilaterally without wheezes, rales or ronchi; respirations unlabored Chest Wall: No tenderness or deformity Heart: Regular rate and rhythm, S1 and S2 normal, no murmur, rub or gallop Breast Exam: deferred. Mammogram UTD Abdomen: Soft, non-tender, nondistended, normoactive bowel sounds, no palpable masses Genitalia: refuses  Extremities: No clubbing, cyanosis or edema Pulses: 2+ and symmetric all  extremities Skin: Skin color, texture, turgor normal, no rashes or lesions Lymph nodes: Cervical, supraclavicular, and axillary nodes normal Neurologic: CNII-XII intact, normal strength, sensation and gait; reflexes 2+ and symmetric throughout Psych: depressed mood, flat affect, normal hygiene and grooming.  ASSESSMENT/PLAN: Medicare annual wellness visit, subsequent -having difficulty with ADLs and will get PCS to assist her. No recent falls. Recommend she continue using walker and doing PT.  Declines to discuss advance directives.  Will need to have her see her psychiatrist to assist with depression.   Routine general medical examination at a health care facility-  Reviewed preventive health care. She is overdue for colonoscopy and did not return cologuard. Mammogram, DEXA up to date. Declines pelvic exam.  Immunizations reviewed.  Safety is a concern and will get PCS involved.   Essential hypertension- controlled. Continue medications.   Estrogen deficiency- see osteopenia  Osteopenia, unspecified location- continue taking vitamin D supplement, getting calcium in diet and walking for exercise.   Mixed hyperlipidemia due to type 2 diabetes mellitus ([place])-  -continue on statin.   Type 2 diabetes mellitus with stage 3 chronic kidney disease, with long-term current use of insulin (HCC) - Plan: Microalbumin / creatinine urine ratio -follow up with Dr. Loanne Drilling next week   Severe episode of recurrent major depressive disorder, without psychotic features ([place])- see  note below   Medication noncompliance due to cognitive impairment - Plan: Ambulatory referral to Neurology. Will get home health services to assist her.   Abnormal MMSE - Plan: Ambulatory referral to Neurology  Mini-cog 1/7 MMSE 23. Mild impairment.  She is having issues with ADLs. [facility] is helping her and I will sign off on her getting personal care services in her home.  Referral to neurology.  Her daughter [name] called  during our visit and she will call patient's psychiatrist to schedule a visit right away.  Strict precautions for her to call 911 if she has any thoughts of self harm. I do feel that she is safe to go home today.   UA 2+glucose, trace protein. No sign of infection  PCOT glucose-344. Has not taken her medication yet today    Discussed monthly self breast exams and yearly mammograms; at least 30 minutes of aerobic activity at least 5 days/week and weight-bearing exercise 2x/week; proper sunscreen use reviewed; healthy diet, including goals of calcium and vitamin D intake and alcohol recommendations (less than or equal to 1 drink/day) reviewed; regular seatbelt use; changing batteries in smoke detectors.  Immunization recommendations discussed.  Colonoscopy recommendations reviewed   Medicare Attestation I have personally reviewed: The patient's medical and social history Their use of alcohol, tobacco or illicit drugs Their current medications and supplements The patient's functional ability including ADLs,fall risks, home safety risks, cognitive, and hearing and visual impairment Diet and physical activities Evidence for depression or mood disorders  The patient's weight, height, and BMI have been recorded in the chart.  I have made referrals, counseling, and provided education to the patient based on review of the above and I have provided the patient with a written personalized care plan for preventive services.     [name], NP-C   10/12/2018

## 2018-10-12 ENCOUNTER — Other Ambulatory Visit: Payer: Self-pay

## 2018-10-12 ENCOUNTER — Encounter: Payer: Self-pay | Admitting: Family Medicine

## 2018-10-12 ENCOUNTER — Ambulatory Visit (INDEPENDENT_AMBULATORY_CARE_PROVIDER_SITE_OTHER): Payer: Medicare HMO | Admitting: Family Medicine

## 2018-10-12 VITALS — BP 110/58 | HR 86 | Temp 98.1°F | Resp 16 | Ht 64.0 in | Wt 164.2 lb

## 2018-10-12 DIAGNOSIS — Z91148 Patient's other noncompliance with medication regimen for other reason: Secondary | ICD-10-CM

## 2018-10-12 DIAGNOSIS — E1122 Type 2 diabetes mellitus with diabetic chronic kidney disease: Secondary | ICD-10-CM | POA: Diagnosis not present

## 2018-10-12 DIAGNOSIS — E1169 Type 2 diabetes mellitus with other specified complication: Secondary | ICD-10-CM

## 2018-10-12 DIAGNOSIS — N183 Chronic kidney disease, stage 3 unspecified: Secondary | ICD-10-CM

## 2018-10-12 DIAGNOSIS — E2839 Other primary ovarian failure: Secondary | ICD-10-CM | POA: Diagnosis not present

## 2018-10-12 DIAGNOSIS — Z9114 Patient's other noncompliance with medication regimen: Secondary | ICD-10-CM

## 2018-10-12 DIAGNOSIS — Z794 Long term (current) use of insulin: Secondary | ICD-10-CM | POA: Diagnosis not present

## 2018-10-12 DIAGNOSIS — I10 Essential (primary) hypertension: Secondary | ICD-10-CM | POA: Diagnosis not present

## 2018-10-12 DIAGNOSIS — F332 Major depressive disorder, recurrent severe without psychotic features: Secondary | ICD-10-CM

## 2018-10-12 DIAGNOSIS — R419 Unspecified symptoms and signs involving cognitive functions and awareness: Secondary | ICD-10-CM | POA: Insufficient documentation

## 2018-10-12 DIAGNOSIS — M8580 Other specified disorders of bone density and structure, unspecified site: Secondary | ICD-10-CM

## 2018-10-12 DIAGNOSIS — Z0000 Encounter for general adult medical examination without abnormal findings: Secondary | ICD-10-CM

## 2018-10-12 DIAGNOSIS — E782 Mixed hyperlipidemia: Secondary | ICD-10-CM

## 2018-10-12 DIAGNOSIS — F99 Mental disorder, not otherwise specified: Secondary | ICD-10-CM

## 2018-10-12 LAB — POCT URINALYSIS DIP (PROADVANTAGE DEVICE)
Bilirubin, UA: NEGATIVE
Blood, UA: NEGATIVE
Glucose, UA: 500 mg/dL — AB
Ketones, POC UA: NEGATIVE mg/dL
Leukocytes, UA: NEGATIVE
Nitrite, UA: NEGATIVE
Specific Gravity, Urine: 1.02
Urobilinogen, Ur: NEGATIVE
pH, UA: 6 (ref 5.0–8.0)

## 2018-10-12 LAB — GLUCOSE, POCT (MANUAL RESULT ENTRY): POC Glucose: 344 mg/dl — AB (ref 70–99)

## 2018-10-12 NOTE — Telephone Encounter (Signed)
Will discuss this with her in person today

## 2018-10-12 NOTE — Telephone Encounter (Signed)
SECOND ATTEMPT:  Called pt again and LVM requesting returned call.

## 2018-10-12 NOTE — Telephone Encounter (Signed)
LVM requesting returned call 

## 2018-10-12 NOTE — Telephone Encounter (Signed)
Pt only uses pillpack for mail order and then uses a local pharmacy for immediate rxs

## 2018-10-12 NOTE — Telephone Encounter (Signed)
Taking tramadol 1 tablets daily for legs. She is very confused and thinks you have been prescribing this

## 2018-10-13 ENCOUNTER — Other Ambulatory Visit: Payer: Self-pay | Admitting: *Deleted

## 2018-10-13 ENCOUNTER — Other Ambulatory Visit: Payer: Self-pay

## 2018-10-13 ENCOUNTER — Telehealth: Payer: Self-pay | Admitting: Family Medicine

## 2018-10-13 LAB — MICROALBUMIN / CREATININE URINE RATIO
Creatinine, Urine: 96.4 mg/dL
Microalb/Creat Ratio: 79 mg/g creat — ABNORMAL HIGH (ref 0–29)
Microalbumin, Urine: 76.3 ug/mL

## 2018-10-13 NOTE — Telephone Encounter (Signed)
I will not be refilling this. It should have been taken off her medication list. If we need to contact pill pak directly please do this. Thanks.

## 2018-10-13 NOTE — Patient Outreach (Signed)
[facility]) Care Management  10/13/2018  Courtney Goodman September 15, 1945 [id number]   Received call from Beverlee Nims at [facility] regarding request for personal care services that was sent to NP, [name], for completion.  Per Beverlee Nims, a request form was also received from [facility] [facility].  Informed Beverlee Nims that only one form needs to be completed.  She will send completed form back to me.  Once received, it will be faxed to [facility].  I will follow up with patient once [facility] confirms receipt of request as an assessment will need to be scheduled at that time.  [name], BSW Social Worker [phone]

## 2018-10-13 NOTE — Telephone Encounter (Signed)
pillpack was informed this was discontinued

## 2018-10-13 NOTE — Patient Outreach (Signed)
[facility]) Care Management  10/13/2018  Courtney Goodman [place] 02/13/1945 [id number]   Unsuccessful outreach #2.  Call placed to member to follow up on diabetes management, no answer.  HIPAA compliant voice message left.  Unsuccessful outreach letter sent, will follow up within the next 3-4 business days.  [name], [place], MSN [place] [phone]

## 2018-10-13 NOTE — Telephone Encounter (Signed)
Pharmacy sent refill request for tramadol 50mg  please send refill request to [place], [place]

## 2018-10-13 NOTE — Telephone Encounter (Signed)
FINAL ATTEMPT:  Called both pt and dtr's phone #'s. Pt did not answer and dtr's phone sounded as if someone had picked up but did not respond to my attempts to state "HELLO". Call then disconnected. Letter is being mailed.

## 2018-10-13 NOTE — Telephone Encounter (Signed)
Called Amber Chrismon 336 [phone] with [facility] as we had received the same [place] From from [facility] and [facility].  [facility] is care management, not home health.  They will send the same for to [facility] [phone] just as [facility] will do.  [facility] handles all of Medicare Home health.  I will send form to [facility] and [facility].

## 2018-10-15 ENCOUNTER — Other Ambulatory Visit: Payer: Self-pay | Admitting: Internal Medicine

## 2018-10-15 ENCOUNTER — Other Ambulatory Visit: Payer: Self-pay | Admitting: *Deleted

## 2018-10-15 ENCOUNTER — Telehealth: Payer: Self-pay | Admitting: Endocrinology

## 2018-10-15 DIAGNOSIS — D649 Anemia, unspecified: Secondary | ICD-10-CM

## 2018-10-15 NOTE — Telephone Encounter (Signed)
MEDICATION: A Meter (Patient is unsure of what she takes)  PHARMACY:  [facility] [phone] - [place], [place] -  IS THIS A 90 DAY SUPPLY :   IS PATIENT OUT OF MEDICATION:   IF NOT; HOW MUCH IS LEFT:   LAST APPOINTMENT DATE: @9 /11/2018  NEXT APPOINTMENT DATE:@9 /23/2020  DO WE HAVE YOUR PERMISSION TO LEAVE A DETAILED MESSAGE:  OTHER COMMENTS:  Informed patient that we also have been trying to reach her for several days. States to call in the morning at [phone]  **Let patient know to contact pharmacy at the end of the day to make sure medication is ready. **  ** Please notify patient to allow 48-72 hours to process**  **Encourage patient to contact the pharmacy for refills or they can request refills through [facility]**

## 2018-10-15 NOTE — Patient Outreach (Signed)
[facility]) Care Management  10/15/2018  Vicki Chaffin [place] 03-Dec-1945 [id number]   CSW made an attempt to try and contact patient [phone]) today to follow-up regarding social work services and resources; however, patient was not available at the time of CSW's call.  A HIPAA compliant message was left for patient on voicemail.  CSW is currently awaiting a return call.  CSW will make a second outreach attempt within the next 3-4 business days, if a return call is not received from patient in the meantime.  Patient explained to CSW last week, during the initial phone conversation, that the mobile contact number [phone]) listed for her, did not actually belong to her, but her daughter, [name], advising CSW to refrain from trying to contact her at this number.  CSW tried calling patient at this contact number ([phone]) today; however, the female that answered the phone indicated that [place] had the wrong number when CSW asked to speak with [facility].  CSW will make arrangements to remove this contact number from patient's chart.  Last, when calling this contact number [phone]) listed for patient, CSW received a Spanish speaking female, indicating that she does not speak English, unable to comprehend who CSW was requesting to speak with.  CSW will also omit this contact number from patient's chart.  Nat Christen, BSW, MSW, LCSW  Licensed Education officer, environmental Health System  Mailing [place] [address], [place],  [address] Physical Address-[address], [place],  [address] Toll Free Main # [phone] Fax # [phone] Cell # [phone]  Office # [phone] [email or web address]

## 2018-10-16 ENCOUNTER — Telehealth: Payer: Self-pay | Admitting: Endocrinology

## 2018-10-16 ENCOUNTER — Other Ambulatory Visit: Payer: Self-pay

## 2018-10-16 ENCOUNTER — Telehealth: Payer: Self-pay

## 2018-10-16 DIAGNOSIS — N183 Chronic kidney disease, stage 3 unspecified: Secondary | ICD-10-CM

## 2018-10-16 DIAGNOSIS — Z794 Long term (current) use of insulin: Secondary | ICD-10-CM

## 2018-10-16 DIAGNOSIS — E1122 Type 2 diabetes mellitus with diabetic chronic kidney disease: Secondary | ICD-10-CM

## 2018-10-16 MED ORDER — TRUE METRIX METER W/DEVICE KIT: 1.0000 | PACK | Freq: Two times a day (BID) | 0 refills | Status: DC

## 2018-10-16 MED ORDER — INSULIN DETEMIR 100 UNIT/ML FLEXPEN: 70.0000 [IU] | PEN_INJECTOR | SUBCUTANEOUS | 3 refills | Status: DC

## 2018-10-16 NOTE — Telephone Encounter (Signed)
Blood Glucose Monitoring Suppl (TRUE METRIX METER) w/Device KIT 1 kit 0 10/16/2018    Sig - Route: 1 each by Does not apply route 2 (two) times daily. Use to monitor glucose levels BID; E11.29 - Does not apply   Sent to pharmacy as: Blood Glucose Monitoring Suppl (TRUE METRIX METER) w/Device Kit   E-Prescribing Status: Receipt confirmed by pharmacy (10/16/2018 7:09 AM EDT)

## 2018-10-16 NOTE — Telephone Encounter (Signed)
Advising pt has had a death in her family and canceled her visit for this week. [place]

## 2018-10-16 NOTE — Telephone Encounter (Signed)
Called dtr and informed of the following orders from 10/09/18:  Ok, please increase levemir to70 unitseach morning  Dtr verbalized acceptance and understanding.

## 2018-10-16 NOTE — Telephone Encounter (Signed)
Patient's daughter [name] ph# [phone] requests to be called re: patient's medication (Levemire-possible dosage change). Right now patient is taking 60 units per day of Levemire. [name] wants to make sure that she knows that her mother is taking the correct dosage of Levemire.

## 2018-10-16 NOTE — Patient Outreach (Signed)
[facility]) Care Management  10/16/2018  Courtney Goodman 1945/02/03 [id number]   [place] regarding request for personal care services.  Request form was received and is being processed.   Called patient to inform her of above but she quickly requested a call back next week due to a death in the family.  Will follow up with her next week.  [name], BSW Social Worker [phone]

## 2018-10-19 ENCOUNTER — Other Ambulatory Visit: Payer: Self-pay | Admitting: *Deleted

## 2018-10-19 ENCOUNTER — Ambulatory Visit: Payer: Self-pay

## 2018-10-19 ENCOUNTER — Other Ambulatory Visit: Payer: Self-pay

## 2018-10-19 ENCOUNTER — Ambulatory Visit: Payer: Medicare HMO | Admitting: Family Medicine

## 2018-10-19 NOTE — Patient Outreach (Signed)
[place] [facility]) Care Management  10/19/2018  [name] 1945/11/28 [id number]   Call placed to member to follow up on management of diabetes.  She report better management recently, today's reading was 150.  Denies any hyper/hypoglycemic episodes over the last couple weeks.  Taking 70 units daily of Levemir and daily Victoza.  Has follow up appointment with endocrinologist on 9/23.  Per chart, had follow up appointment with PCP this morning but missed it.  State she will call to reschedule.  Will have daughter take her to appointment on Wednesday.  This care manager inquired about reporting her grandson to her apartment manager in effort to have him removed from her home, she denies.  State she will just continue to have daughter stay in the home more often.  Report she has not seen her therapist in a while, usually seen every monty.  She will call to schedule appointment.    Denies any urgent concerns, advised to contact this care manager with questions.  Will follow up within the next 2 weeks.  [facility] CM Care Plan Problem One     Most Recent Value  Care Plan Problem One  High Risk for hospital readmission related to/ as evidenced by recent hospital visit August 25- 27, 2020 for acute renal failure/ CKD secondary to DM  Role Documenting the Problem One  Care Management [place] for Problem One  Active  [facility] Long Term Goal   Over the next 31 days, patient will not experience hospital readmission, as evidenced by patient reporting and review of EMR during [facility] RN CM outreach  [facility] Long Term Goal Start Date  09/25/18  Interventions for Problem One Long Term Goal  Recent medication changes reviewed with member.  Advised to continue frequent monitoring and documentation of trends in effort to discuss plan of care with MD.  [facility] CM Short Term Goal #1   Over the next 30 days, patient will actively participate in home health services for nursing and PT as ordered post-hospital  discharge, as evidenced by patient reporitng and collaboration as indicated with home health team, as indicated, during [facility] RN CM outreach  [facility] CM Short Term Goal #1 Start Date  09/25/18  [facility] CM Short Term Goal #1 Met Date  10/19/18  [facility] CM Short Term Goal #2   Over the next 30 days, patient will attend all scheduled provider appointments as evidenced by patient reporting and collaboration with patient's care providers as indicated during [facility] RN CM outreach  [facility] CM Short Term Goal #2 Start Date  09/25/18  Interventions for Short Term Goal #2  Reviewed upcoming appointments with providers.  Advised to contact to reschedule missed appointment this morning     [name], RN, MSN [place] Manager [phone]

## 2018-10-19 NOTE — Patient Outreach (Signed)
[facility]) Care Management  10/19/2018  Madeline Pho [facility] Aug 14, 1945 [id number]   Second attempt to follow up with patient regarding status of request for personal care services.  Receipt of request form has been confirmed by [facility] so patient should be contacted for purpose of assessment.  Unable to leave message due to mailbox being full.  Mailed unsuccessful outreach letter.  Will attempt to reach again within four business days.  [name], BSW Social Worker [phone]

## 2018-10-20 ENCOUNTER — Inpatient Hospital Stay: Payer: Medicare HMO

## 2018-10-20 ENCOUNTER — Other Ambulatory Visit: Payer: Self-pay

## 2018-10-20 ENCOUNTER — Encounter: Payer: Self-pay | Admitting: Family Medicine

## 2018-10-20 DIAGNOSIS — D649 Anemia, unspecified: Secondary | ICD-10-CM

## 2018-10-20 DIAGNOSIS — N189 Chronic kidney disease, unspecified: Secondary | ICD-10-CM | POA: Diagnosis not present

## 2018-10-20 DIAGNOSIS — D638 Anemia in other chronic diseases classified elsewhere: Secondary | ICD-10-CM

## 2018-10-20 DIAGNOSIS — I129 Hypertensive chronic kidney disease with stage 1 through stage 4 chronic kidney disease, or unspecified chronic kidney disease: Secondary | ICD-10-CM | POA: Diagnosis not present

## 2018-10-20 DIAGNOSIS — D631 Anemia in chronic kidney disease: Secondary | ICD-10-CM | POA: Diagnosis not present

## 2018-10-20 DIAGNOSIS — N183 Chronic kidney disease, stage 3 unspecified: Secondary | ICD-10-CM

## 2018-10-20 LAB — CBC WITH DIFFERENTIAL (CANCER CENTER ONLY)
Abs Immature Granulocytes: 0.02 10*3/uL (ref 0.00–0.07)
Basophils Absolute: 0 10*3/uL (ref 0.0–0.1)
Basophils Relative: 1 %
Eosinophils Absolute: 0.1 10*3/uL (ref 0.0–0.5)
Eosinophils Relative: 2 %
HCT: 30.8 % — ABNORMAL LOW (ref 36.0–46.0)
Hemoglobin: 9.9 g/dL — ABNORMAL LOW (ref 12.0–15.0)
Immature Granulocytes: 0 %
Lymphocytes Relative: 17 %
Lymphs Abs: 1.3 10*3/uL (ref 0.7–4.0)
MCH: 29.6 pg (ref 26.0–34.0)
MCHC: 32.1 g/dL (ref 30.0–36.0)
MCV: 92.2 fL (ref 80.0–100.0)
Monocytes Absolute: 0.8 10*3/uL (ref 0.1–1.0)
Monocytes Relative: 11 %
Neutro Abs: 5 10*3/uL (ref 1.7–7.7)
Neutrophils Relative %: 69 %
Platelet Count: 331 10*3/uL (ref 150–400)
RBC: 3.34 MIL/uL — ABNORMAL LOW (ref 3.87–5.11)
RDW: 15 % (ref 11.5–15.5)
WBC Count: 7.3 10*3/uL (ref 4.0–10.5)
nRBC: 0 % (ref 0.0–0.2)

## 2018-10-20 MED ORDER — EPOETIN ALFA-EPBX 40000 UNIT/ML IJ SOLN: 40000.0000 [IU] | Freq: Once | INTRAMUSCULAR | Status: DC

## 2018-10-20 NOTE — Progress Notes (Signed)
There was a PA needed for this injection informed patient that she would need to be rescheduled once the PA was approved. Informed Karen Chafe, RN as well.

## 2018-10-21 ENCOUNTER — Encounter: Payer: Self-pay | Admitting: Endocrinology

## 2018-10-21 ENCOUNTER — Ambulatory Visit (INDEPENDENT_AMBULATORY_CARE_PROVIDER_SITE_OTHER): Payer: Medicare HMO | Admitting: Endocrinology

## 2018-10-21 ENCOUNTER — Other Ambulatory Visit: Payer: Self-pay | Admitting: *Deleted

## 2018-10-21 ENCOUNTER — Other Ambulatory Visit: Payer: Self-pay

## 2018-10-21 ENCOUNTER — Encounter: Payer: Self-pay | Admitting: *Deleted

## 2018-10-21 VITALS — BP 110/54 | HR 72 | Ht 64.0 in | Wt 163.8 lb

## 2018-10-21 DIAGNOSIS — I2510 Atherosclerotic heart disease of native coronary artery without angina pectoris: Secondary | ICD-10-CM | POA: Diagnosis not present

## 2018-10-21 DIAGNOSIS — E782 Mixed hyperlipidemia: Secondary | ICD-10-CM | POA: Diagnosis not present

## 2018-10-21 DIAGNOSIS — Z794 Long term (current) use of insulin: Secondary | ICD-10-CM

## 2018-10-21 DIAGNOSIS — N183 Chronic kidney disease, stage 3 unspecified: Secondary | ICD-10-CM

## 2018-10-21 DIAGNOSIS — E1122 Type 2 diabetes mellitus with diabetic chronic kidney disease: Secondary | ICD-10-CM | POA: Diagnosis not present

## 2018-10-21 DIAGNOSIS — F418 Other specified anxiety disorders: Secondary | ICD-10-CM | POA: Diagnosis not present

## 2018-10-21 DIAGNOSIS — I499 Cardiac arrhythmia, unspecified: Secondary | ICD-10-CM | POA: Diagnosis not present

## 2018-10-21 DIAGNOSIS — I10 Essential (primary) hypertension: Secondary | ICD-10-CM | POA: Diagnosis not present

## 2018-10-21 DIAGNOSIS — D631 Anemia in chronic kidney disease: Secondary | ICD-10-CM | POA: Diagnosis not present

## 2018-10-21 DIAGNOSIS — J449 Chronic obstructive pulmonary disease, unspecified: Secondary | ICD-10-CM | POA: Diagnosis not present

## 2018-10-21 MED ORDER — TRUE METRIX BLOOD GLUCOSE TEST VI STRP: 1.0000 | ORAL_STRIP | Freq: Two times a day (BID) | 0 refills | Status: DC

## 2018-10-21 NOTE — Patient Outreach (Signed)
[facility]) Care Management  10/21/2018  Vicy Medico [place] [birth year] [id number]  CSW was able to make contact with patient today to follow-up regarding social work services and resources, as well as to inquire about whether or not patient is still interested in receiving CSW's assistance with regards to having her grandson removed from her apartment.  Patient stated, "I'm not trying to do nothing right now, but keep my blood sugars in check".  Patient went on to explain that she saw her Endocrinologist, Dr. Renato Shin at [facility] this morning, and that she has been advised to check her blood sugar twice a day.  In addition, patient was encouraged to check her blood sugars if she experiences symptoms of being too high or too low.  Patient agreed to keep a record of her readings, to provide to Dr. Loanne Drilling during her next scheduled visit.    CSW brought the conversation back, by asking patient about her grandson.  Patient stated, "I can't kick him out now, he has nowhere else to go and his probation officer knows to look for him here".  Patient further stated, "I'm not scared of him no more because one of my daughter's is always staying with me".  CSW voiced understanding, inquiring about how CSW could be of further assistance to patient at this time.  Patient denied being able to identify any additional social work needs at present.  CSW reminded patient that she will continue to work with [employer], Social Work Social worker, also with Scientist, clinical (histocompatibility and immunogenetics), regarding Youth worker) through [employer].  [name] has submitted patient's application for processing.  CSW will perform a case closure on patient, as all goals of treatment have been met from social work standpoint and no additional social work needs have been identified at this time.  CSW will notify patient's RNCM with [facility]  [employer], [name] of CSW's plans to close patient's case.  CSW will also notify [name] of CSW's plans to close patient's case.  CSW will fax an update to patient's Primary Care Physician, [name] to ensure that they are aware of CSW's involvement with patient's plan of care.  CSW was able to confirm that patient has the correct contact information for CSW, encouraging patient to contact CSW directly if she needs further assistance, or if additional social work needs arise in the near future.    Nat Christen, BSW, MSW, LCSW  Licensed Education officer, environmental Health System  Mailing [place] [address], [place], [place] [address] Physical Address-[address], [place], [place] [address] Toll Free Main # [phone] Fax # [phone] Cell # [phone]  Office # [phone] [email or web address]

## 2018-10-21 NOTE — Progress Notes (Signed)
Subjective:    Patient ID: Courtney Goodman, female    DOB: 03-Sep-1945, 73 y.o.   MRN: [id number]  HPI Pt returns for f/u of diabetes mellitus: DM type: Insulin-requiring type 2 Dx'ed: 7824 Complications: renal insufficiency and CAD.  Therapy: insulin since 2009, and victoza. GDM: never DKA: never Severe hypoglycemia: 2012 and 2019.   Pancreatitis: never.  Other: she is on qd insulin, due to h/o noncompliance; ins declined tresiba.   Interval history: She says she has not recently missed the insulin.  She takes 70 units QAM.  no cbg record, but states cbg's are 120-200's.  pt states she feels well in general, except for fatigue.   Past Medical History:  Diagnosis Date  . Anemia of chronic disease   . Asthma   . Atherosclerotic heart disease of native coronary artery without angina pectoris   . CKD (chronic kidney disease)   . COPD (chronic obstructive pulmonary disease) ([place])   . DM (diabetes mellitus), type 2 with renal complications ([place])   . Dysrhythmia   . Elevated ferritin 01/03/2017  . GAD (generalized anxiety disorder)   . Hypertension   . Mixed hyperlipidemia due to type 2 diabetes mellitus ([place])   . Mixed incontinence    per medical records from [place]  . Osteopenia   . Personal history of noncompliance with medical treatment, presenting hazards to health   . Shortness of breath dyspnea   . Suicidal ideation   . TB (tuberculosis), treated    age 21  . Vitamin D deficiency     Past Surgical History:  Procedure Laterality Date  . ANKLE RECONSTRUCTION     right  . CARPAL TUNNEL RELEASE    . CORONARY ANGIOPLASTY      Social History   Socioeconomic History  . Marital status: Divorced    Spouse name: Not on file  . Number of children: 2  . Years of education: 89  . Highest education level: High school graduate  Occupational History  . Not on file  Social Needs  . Financial resource strain: Somewhat hard  . Food insecurity    Worry: Sometimes true     Inability: Sometimes true  . Transportation needs    Medical: No    Non-medical: No  Tobacco Use  . Smoking status: Former Research scientist (life sciences)  . Smokeless tobacco: Never Used  Substance and Sexual Activity  . Alcohol use: No  . Drug use: No  . Sexual activity: Never  Lifestyle  . Physical activity    Days per week: 0 days    Minutes per session: 0 min  . Stress: Very much  Relationships  . Social connections    Talks on phone: More than three times a week    Gets together: More than three times a week    Attends religious service: More than 4 times per year    Active member of club or organization: No    Attends meetings of clubs or organizations: Never    Relationship status: Divorced  . Intimate partner violence    Fear of current or ex partner: No    Emotionally abused: No    Physically abused: No    Forced sexual activity: No  Other Topics Concern  . Not on file  Social History Narrative  . Not on file    Current Outpatient Medications on File Prior to Visit  Medication Sig Dispense Refill  . albuterol (PROVENTIL HFA;VENTOLIN HFA) 108 (90 Base) MCG/ACT inhaler Inhale 2 puffs  into the lungs every 6 (six) hours as needed for wheezing or shortness of breath. 1 Inhaler 0  . Alcohol Swabs (ALCOHOL PREP) PADS Use for testing 100 each 1  . aspirin EC 81 MG tablet Take 81 mg by mouth daily.     . Biotin 10000 MCG TABS Take 1 tablet by mouth daily.     . Blood Glucose Monitoring Suppl (TRUE METRIX METER) w/Device KIT 1 each by Does not apply route 2 (two) times daily. Use to monitor glucose levels BID; E11.29 1 kit 0  . carvedilol (COREG) 6.25 MG tablet Take 1 tablet by mouth twice daily. (Patient taking differently: Take 6.25 mg by mouth 2 (two) times daily with a meal. ) 90 tablet 0  . citalopram (CELEXA) 20 MG tablet Take 1 tablet (20 mg total) by mouth daily. 90 tablet 1  . fenofibrate (TRICOR) 145 MG tablet Take 1 tablet (145 mg total) by mouth daily. 90 tablet 0  . fluticasone  (FLONASE) 50 MCG/ACT nasal spray Place 2 sprays into both nostrils daily. 48 g 0  . folic acid (FOLVITE) 1 MG tablet Take 1 tablet (1 mg total) by mouth daily. 90 tablet 0  . Insulin Detemir (LEVEMIR) 100 UNIT/ML Pen Inject 70 Units into the skin every morning. 20 pen 3  . Insulin Pen Needle 31G X 5 MM MISC Inject 1 each into the skin 2 (two) times daily. Use to inject insulin twice daily; E11.29 100 each 1  . liraglutide (VICTOZA) 18 MG/3ML SOPN Inject 1.8 mg into the skin daily.    Marland Kitchen omeprazole (PRILOSEC) 40 MG capsule Take 1 capsule (40 mg total) by mouth daily. 30 capsule 0  . simvastatin (ZOCOR) 20 MG tablet Take 1 tablet by mouth every night. (Patient taking differently: Take 20 mg by mouth daily at 6 PM. ) 90 tablet 0  . traZODone (DESYREL) 50 MG tablet Take 50 mg by mouth at bedtime.      No current facility-administered medications on file prior to visit.     No Known Allergies  Family History  Problem Relation Age of Onset  . Diabetes Mother     BP (!) 110/54 (BP Location: Left Arm, Patient Position: Sitting, Cuff Size: Normal)   Pulse 72   Ht 5' 4" (1.626 m)   Wt 163 lb 12.8 oz (74.3 kg)   SpO2 97%   BMI 28.12 kg/m   Review of Systems She denies hypoglycemia.      Objective:   Physical Exam VITAL SIGNS:  See vs page GENERAL: no distress Pulses: dorsalis pedis intact bilat.   MSK: no deformity of the feet.   CV: no leg edema.   Skin:  no ulcer on the feet.  normal color and temp on the feet.   Neuro: sensation is intact to touch on the feet.   Ext: there is bilateral onychomycosis of the toenails.    Lab Results  Component Value Date   CREATININE 2.21 (H) 10/03/2018   BUN 41 (H) 10/03/2018   NA 132 (L) 10/03/2018   K 4.1 10/03/2018   CL 97 (L) 10/03/2018   CO2 21 (L) 10/03/2018   Lab Results  Component Value Date   HGBA1C 9.8 (A) 09/15/2018       Assessment & Plan:  Insulin-requiring type 2 DM, with CAD: worse.  I advised pt to increase insulin, but  she declines Renal failure: she may need a faster-acting qd insulin.  However, there is not enough cbg info in  order to make this change.  Patient Instructions  check your blood sugar twice a day.  vary the time of day when you check, between before the 3 meals, and at bedtime.  also check if you have symptoms of your blood sugar being too high or too low.  please keep a record of the readings and bring it to your next appointment here (or you can bring the meter itself).  You can write it on any piece of paper.  please call us sooner if your blood sugar goes below 70, or if you have a lot of readings over 200.   Please continue the same insulin On this type of insulin schedule, you should eat meals on a regular schedule.  If a meal is missed or significantly delayed, your blood sugar could go low.   Please come back for a follow-up appointment in 6 weeks.

## 2018-10-21 NOTE — Patient Instructions (Addendum)
check your blood sugar twice a day.  vary the time of day when you check, between before the 3 meals, and at bedtime.  also check if you have symptoms of your blood sugar being too high or too low.  please keep a record of the readings and bring it to your next appointment here (or you can bring the meter itself).  You can write it on any piece of paper.  please call us sooner if your blood sugar goes below 70, or if you have a lot of readings over 200.   Please continue the same insulin On this type of insulin schedule, you should eat meals on a regular schedule.  If a meal is missed or significantly delayed, your blood sugar could go low.   Please come back for a follow-up appointment in 6 weeks.

## 2018-10-21 NOTE — Patient Outreach (Signed)
[place] [facility]) Care Management  10/21/2018  Courtney Goodman 11-Sep-1945 [id number]   Successful outreach to patient today.  Informed her that request for personal care services has been received by [employer].  Patient is unsure if she has been contacted for purpose of scheduling assessment.  Provided her with contact information for [facility] and encouraged her to call to schedule assessment.  Will follow up again next week.  [name], BSW Social Worker [phone]

## 2018-10-23 ENCOUNTER — Other Ambulatory Visit: Payer: Self-pay | Admitting: Family Medicine

## 2018-10-23 ENCOUNTER — Telehealth: Payer: Self-pay | Admitting: Family Medicine

## 2018-10-23 NOTE — Telephone Encounter (Signed)
Juanda Crumble  with [facility] [phone] ext. [phone] She called about orders that they need signed on Ms Sayed She states they have faxed multiple times and have called. They have to get the signed order back or they have to discontinue her care   She faxed form over again and I am sending it back in your folder

## 2018-10-23 NOTE — Telephone Encounter (Signed)
I signed forms for her to have personal care services and [facility] faxed them over. Not sure why the do not have them. I can sign them again on Monday. Thanks.

## 2018-10-26 ENCOUNTER — Other Ambulatory Visit: Payer: Self-pay

## 2018-10-26 ENCOUNTER — Ambulatory Visit: Payer: Self-pay

## 2018-10-26 NOTE — Patient Outreach (Signed)
[place] [facility]) Care Management  10/26/2018  Courtney Goodman [birth year] [id number]   Spoke with patient on 10/21/18 and informed her that completed request for pcs has been received by [employer].  Patient was unsure if she had been contacted for scheduling assessment so she was provided with number for [facility] and encouraged to call. Attempted to follow up with her today but phone went straight to voicemail and box is full. Was able to connect with her daughter and provided her with the above information and contact number for [facility].  Will attempt to reach patient again tomorrow.  [name], BSW Social Worker [phone]

## 2018-10-27 ENCOUNTER — Other Ambulatory Visit: Payer: Self-pay

## 2018-10-27 NOTE — Patient Outreach (Signed)
[place] [facility]) Care Management  10/27/2018  Courtney Goodman [place] Jul 19, 1945 [id number]   Follow up call to patient today regarding status of request for [place].   Inquired about whether or not patient is still interested in pursuing services.  Patient stated that it is not a good time for these services because there is "a lot going on."  Patient reports difficulty managing blood sugar, recent passing of niece, and continued challenges with living situation.  Patient's daughter is providing support to patient when she can.  Patient stated that she intends to get in touch with her therapist due to current stressors.  Patient declined another call from [place], Nat Christen.  Closing social work case at this time but did encourage patient to call if additional assistance is needed.  [name], BSW Social Worker [phone]

## 2018-11-02 DIAGNOSIS — K219 Gastro-esophageal reflux disease without esophagitis: Secondary | ICD-10-CM | POA: Diagnosis not present

## 2018-11-02 DIAGNOSIS — M1990 Unspecified osteoarthritis, unspecified site: Secondary | ICD-10-CM | POA: Diagnosis not present

## 2018-11-02 DIAGNOSIS — E1122 Type 2 diabetes mellitus with diabetic chronic kidney disease: Secondary | ICD-10-CM | POA: Diagnosis not present

## 2018-11-02 DIAGNOSIS — D649 Anemia, unspecified: Secondary | ICD-10-CM | POA: Diagnosis not present

## 2018-11-02 DIAGNOSIS — I129 Hypertensive chronic kidney disease with stage 1 through stage 4 chronic kidney disease, or unspecified chronic kidney disease: Secondary | ICD-10-CM | POA: Diagnosis not present

## 2018-11-02 DIAGNOSIS — N183 Chronic kidney disease, stage 3 unspecified: Secondary | ICD-10-CM | POA: Diagnosis not present

## 2018-11-03 ENCOUNTER — Other Ambulatory Visit: Payer: Self-pay

## 2018-11-03 ENCOUNTER — Inpatient Hospital Stay: Payer: Medicare HMO | Attending: Oncology

## 2018-11-03 ENCOUNTER — Telehealth: Payer: Self-pay

## 2018-11-03 ENCOUNTER — Inpatient Hospital Stay: Payer: Medicare HMO

## 2018-11-03 ENCOUNTER — Telehealth: Payer: Self-pay | Admitting: Endocrinology

## 2018-11-03 VITALS — BP 115/65 | HR 70 | Temp 98.7°F | Resp 18

## 2018-11-03 DIAGNOSIS — N189 Chronic kidney disease, unspecified: Secondary | ICD-10-CM | POA: Diagnosis not present

## 2018-11-03 DIAGNOSIS — I499 Cardiac arrhythmia, unspecified: Secondary | ICD-10-CM | POA: Diagnosis not present

## 2018-11-03 DIAGNOSIS — Z79899 Other long term (current) drug therapy: Secondary | ICD-10-CM | POA: Insufficient documentation

## 2018-11-03 DIAGNOSIS — D631 Anemia in chronic kidney disease: Secondary | ICD-10-CM | POA: Diagnosis not present

## 2018-11-03 DIAGNOSIS — E782 Mixed hyperlipidemia: Secondary | ICD-10-CM | POA: Diagnosis not present

## 2018-11-03 DIAGNOSIS — N183 Chronic kidney disease, stage 3 unspecified: Secondary | ICD-10-CM | POA: Diagnosis not present

## 2018-11-03 DIAGNOSIS — J449 Chronic obstructive pulmonary disease, unspecified: Secondary | ICD-10-CM | POA: Diagnosis not present

## 2018-11-03 DIAGNOSIS — F418 Other specified anxiety disorders: Secondary | ICD-10-CM | POA: Diagnosis not present

## 2018-11-03 DIAGNOSIS — I129 Hypertensive chronic kidney disease with stage 1 through stage 4 chronic kidney disease, or unspecified chronic kidney disease: Secondary | ICD-10-CM | POA: Insufficient documentation

## 2018-11-03 DIAGNOSIS — E1122 Type 2 diabetes mellitus with diabetic chronic kidney disease: Secondary | ICD-10-CM | POA: Diagnosis not present

## 2018-11-03 DIAGNOSIS — I10 Essential (primary) hypertension: Secondary | ICD-10-CM | POA: Diagnosis not present

## 2018-11-03 DIAGNOSIS — I2510 Atherosclerotic heart disease of native coronary artery without angina pectoris: Secondary | ICD-10-CM | POA: Diagnosis not present

## 2018-11-03 DIAGNOSIS — D638 Anemia in other chronic diseases classified elsewhere: Secondary | ICD-10-CM

## 2018-11-03 LAB — CBC WITH DIFFERENTIAL (CANCER CENTER ONLY)
Abs Immature Granulocytes: 0.03 10*3/uL (ref 0.00–0.07)
Basophils Absolute: 0.1 10*3/uL (ref 0.0–0.1)
Basophils Relative: 1 %
Eosinophils Absolute: 0.1 10*3/uL (ref 0.0–0.5)
Eosinophils Relative: 1 %
HCT: 30.3 % — ABNORMAL LOW (ref 36.0–46.0)
Hemoglobin: 9.7 g/dL — ABNORMAL LOW (ref 12.0–15.0)
Immature Granulocytes: 0 %
Lymphocytes Relative: 19 %
Lymphs Abs: 1.6 10*3/uL (ref 0.7–4.0)
MCH: 29 pg (ref 26.0–34.0)
MCHC: 32 g/dL (ref 30.0–36.0)
MCV: 90.7 fL (ref 80.0–100.0)
Monocytes Absolute: 0.6 10*3/uL (ref 0.1–1.0)
Monocytes Relative: 7 %
Neutro Abs: 6.1 10*3/uL (ref 1.7–7.7)
Neutrophils Relative %: 72 %
Platelet Count: 329 10*3/uL (ref 150–400)
RBC: 3.34 MIL/uL — ABNORMAL LOW (ref 3.87–5.11)
RDW: 15 % (ref 11.5–15.5)
WBC Count: 8.4 10*3/uL (ref 4.0–10.5)
nRBC: 0 % (ref 0.0–0.2)

## 2018-11-03 MED ORDER — EPOETIN ALFA-EPBX 40000 UNIT/ML IJ SOLN
40000.0000 [IU] | Freq: Once | INTRAMUSCULAR | Status: AC
  Administered 2018-11-03: 40000 [IU] via SUBCUTANEOUS
  Filled 2018-11-03: qty 1

## 2018-11-03 NOTE — Telephone Encounter (Signed)
Today: no more insulin, but drink plenty of fluids.   Starting tomorrow am: take 130 units qam.   F/u ASAP.

## 2018-11-03 NOTE — Telephone Encounter (Signed)
Attempted to call pt to inform of orders below. Phone continued to ring with no answer nor ability to leave voice message. Will continue to reach out to pt

## 2018-11-03 NOTE — Telephone Encounter (Signed)
Please try again tomorrow.

## 2018-11-03 NOTE — Telephone Encounter (Signed)
Received call from [facility] with [facility] stating pt is c/o blurred vision. CBG is 599 after taking 130 units of Levemir (not what has been Rx'd) and Victoza 1.8mg .   1. Are you taking any type of steroids? No  2. Are you sick or becoming sick? No  3. Is this the first elevated blood sugar reading? No  4. Have you tried to correct it with insulin? Yes  5. Have you been taking/taken today all of the prescribed medications? N/A  6. What is your current diabetic insulin or oral medication dosage? Levemir 70units if high readings has been taking an additional 60 units. Vicotza 1.8mg   Declined to schedule an appt both in person or virtual. Advised to proceed to ED for further evaluation d/t c/o blurred vision and uncontrolled CBG   Date Time Reading Notes       10/6 215p 599   10/6 145p 529   10/5 857a 291   10/4 314p 534   10/4 311p HIGH   10/3 929a 146               Please advise

## 2018-11-03 NOTE — Telephone Encounter (Signed)
THIRD ATTEMPT:   Called pt to inform of new orders. Again, phone continued to ring but pt did not answer. No ability to LVM. Routing to Dr. Loanne Drilling to make him aware of attempts made.

## 2018-11-03 NOTE — Telephone Encounter (Signed)
Pt was advised to go to ED because she does not have any transportation to come in to the office for both today or tomorrow.

## 2018-11-03 NOTE — Telephone Encounter (Signed)
Attempted to call pt to inform of orders below. Phone continued to ring with no answer nor ability to leave voice message. Will continue to reach out to pt      Documentation     Renato Shin, MD routed conversation to You 19 minutes ago (3:30 PM)    Renato Shin, MD 19 minutes ago (3:30 PM)     Today: no more insulin, but drink plenty of fluids.   Starting tomorrow am: take 130 units qam.   F/u ASAP.

## 2018-11-03 NOTE — Telephone Encounter (Signed)
SECOND ATTEMPT:  Called pt again to inform of new orders. Phone rang x3, then automated voice stated call cannot be completed at this time and to try call again later.

## 2018-11-03 NOTE — Telephone Encounter (Signed)
Drink plenty of fluids. Tomorrow am: take 130 units  F/u ov tomorrow.

## 2018-11-04 NOTE — Telephone Encounter (Signed)
Once again attempted to call pt. Phone still continues to ring continuously with nobody answering phone nor ability to LVM.

## 2018-11-04 NOTE — Telephone Encounter (Signed)
Before we give up, can you try to call [facility] nurse once?  Thank you.

## 2018-11-05 ENCOUNTER — Other Ambulatory Visit: Payer: Self-pay

## 2018-11-05 ENCOUNTER — Other Ambulatory Visit: Payer: Self-pay | Admitting: *Deleted

## 2018-11-05 DIAGNOSIS — N390 Urinary tract infection, site not specified: Secondary | ICD-10-CM | POA: Diagnosis not present

## 2018-11-05 DIAGNOSIS — E1122 Type 2 diabetes mellitus with diabetic chronic kidney disease: Secondary | ICD-10-CM

## 2018-11-05 DIAGNOSIS — Z794 Long term (current) use of insulin: Secondary | ICD-10-CM

## 2018-11-05 DIAGNOSIS — N183 Chronic kidney disease, stage 3 unspecified: Secondary | ICD-10-CM

## 2018-11-05 MED ORDER — INSULIN DETEMIR 100 UNIT/ML FLEXPEN: 130.0000 [IU] | PEN_INJECTOR | SUBCUTANEOUS | 3 refills | Status: DC

## 2018-11-05 MED ORDER — LIRAGLUTIDE 18 MG/3ML ~~LOC~~ SOPN: 0.6000 mg | PEN_INJECTOR | Freq: Every day | SUBCUTANEOUS | 0 refills | Status: DC

## 2018-11-05 MED ORDER — LIRAGLUTIDE 18 MG/3ML ~~LOC~~ SOPN: 1.8000 mg | PEN_INJECTOR | Freq: Every day | SUBCUTANEOUS | 2 refills | Status: DC

## 2018-11-05 NOTE — Telephone Encounter (Signed)
Please take 130 units qam. Ov next week

## 2018-11-05 NOTE — Telephone Encounter (Signed)
Dtr [name] returned call. Before giving new orders, wanted to know what pt status has been since 11/03/18. Asked if pt had been taken to ED for eval. States she was not aware of any elevated CBG's. Advised of previous conversations from [place] and of CBG readings. If CBG's are within a normal range, it would not make sense to take a newly prescribed dose of insulin. However, if CBG's remain high as previously noted, would provide with new orders. Asked that she provide CBG readings since conversation on 10/6. States she is not with her mother but would call her to get her readings. Will await returned call.

## 2018-11-05 NOTE — Telephone Encounter (Signed)
[name] (dtr) [phone] called back to provide the followng CBG readings:  375 and 423  When asked date and time of those readings, dtr could not provide. Also stated pt has not checked her CBG today. Advised I would discuss with Dr. Loanne Drilling to ensure the dosage 130 units Levemir is correct and will call right back with his orders. Strongly encouraged she be alert to my call and to answer so pt can be notified about Dr. Cordelia Pen orders. Verbalized acceptance and understanding.

## 2018-11-05 NOTE — Telephone Encounter (Signed)
Never took it

## 2018-11-05 NOTE — Telephone Encounter (Signed)
Please continue the same Levemir Please resume Victoza at 0.6 mg qd. Call back tomorrow to report cbg's.

## 2018-11-05 NOTE — Patient Outreach (Signed)
Courtney Goodman) Care Management  11/05/2018  Courtney Goodman [birth year] [id number]   Call placed to member to follow up on diabetes management.  She report she is not feeling well today, blood sugar was greater then 500.  State her daughter was on the phone with MD trying to determine how much insulin she should take.  She is anticipating to take at least 130 units of Levemir but will confirm with daughter Courtney Goodman.  She will also have a visit from home health nurse today.  She has follow up appointment with endocrinologist next week.    She report she has been stressed dealing with family problems and overall management of her health. Although she declined having assessment for in home aide services, she feel she is ready now.  BSW notified and advised to contact daughter for assistance.  Denies any urgent concerns at this time, advised that she will transfer to [place] for continued management.  [facility] CM Care Plan Problem One     Most Recent Value  Care Plan Problem One  High Risk for hospital readmission related to/ as evidenced by recent hospital visit August 25- 27, 2020 for acute renal failure/ CKD secondary to DM  Role Documenting the Problem One  Care Management [place] for Problem One  Not Active  [facility] Long Term Goal   Over the next 31 days, patient will not experience hospital readmission, as evidenced by patient reporting and review of EMR during [facility] RN CM outreach  [facility] Long Term Goal Start Date  09/25/18  [facility] Long Term Goal Met Date  11/05/18  [facility] CM Short Term Goal #1   Over the next 30 days, patient will actively participate in home health services for nursing and PT as ordered post-hospital discharge, as evidenced by patient reporitng and collaboration as indicated with home health team, as indicated, during [facility] RN CM outreach  [facility] CM Short Term Goal #1 Start Date  09/25/18  [facility] Goodman CM Short Term Goal #1 Met Date  10/19/18  [facility] CM Short Term Goal #2    Over the next 30 days, patient will attend all scheduled provider appointments as evidenced by patient reporting and collaboration with patient's care providers as indicated during [facility] RN CM outreach  [facility] CM Short Term Goal #2 Start Date  09/25/18  [facility] CM Short Term Goal #2 Met Date  11/05/18    [facility] Goodman CM Care Plan Problem Two     Most Recent Value  Care Plan Problem Two  Knowlege deficit regarding managment of diabetes as evidenced by elevated A1C  Role Documenting the Problem Two  Care Management [place] for Problem Two  Active  [facility] for Problem Two Long Term Goal   Member educated on importance of reducing stress and complications of uncontrolled diabetes.  [facility] Long Term Goal  Member's A1C will decrease </= 8 within the next 3 months  [facility] Long Term Goal Start Date  11/05/18     [name], RN, MSN [place] [phone]

## 2018-11-05 NOTE — Telephone Encounter (Signed)
I need to know how long pt has been on 130 units qam

## 2018-11-05 NOTE — Telephone Encounter (Signed)
[name] (dtr) called to inform that pt checked CBG at 351pm. Results were 541 after taking 130 units of Levemir. States pt has been out of Victoza. Denies pt becoming ill, fever or taking prednisone. Requesting orders. Routing to Dr. Loanne Drilling for advice.

## 2018-11-05 NOTE — Telephone Encounter (Signed)
Courtney Goodman, dtr (emergency contact) [phone]. No answer. [facility] nurse, Levada Dy, [phone] and also received no answer nor VM. Finally, called pt @ [phone]. During this final attempt, I was able to reach pt VM but her mailbox was full. Automated voice asked if I wanted to send an SMS of our phone #. This was the final attempt I made to make contact with pt. Pressed option 1 to send SMS. Automated voice indicated that our phone # [phone] was sent successfully.

## 2018-11-05 NOTE — Telephone Encounter (Signed)
Called [name] (dtr) and informed about new orders. Strongly encouraged to call with update of CBG's. Verbalized acceptance and understanding. Following Rx's sent to [facility] as ordered:  liraglutide (VICTOZA) 18 MG/3ML SOPN 3 mL 0 11/05/2018    Sig - Route: Inject 0.1 mLs (0.6 mg total) into the skin daily. - Subcutaneous   Sent to pharmacy as: liraglutide (VICTOZA) 18 MG/3ML Solution Pen-injector   E-Prescribing Status: Receipt confirmed by pharmacy (11/05/2018  4:47 PM EDT)    Insulin Detemir (LEVEMIR) 100 UNIT/ML Pen 20 pen 3 11/05/2018    Sig - Route: Inject 130 Units into the skin every morning. - Subcutaneous   Sent to pharmacy as: Insulin Detemir (LEVEMIR) 100 UNIT/ML Pen   E-Prescribing Status: Receipt confirmed by pharmacy (11/05/2018  4:48 PM EDT)

## 2018-11-05 NOTE — Telephone Encounter (Signed)
Called [name] (dtr) and informed of new orders. Also advised to call if still having elevated CBG's so we can address if needed. Verbalized acceptance and understanding. Agreed to bring pt in to be seen 11/13/18.

## 2018-11-06 ENCOUNTER — Other Ambulatory Visit: Payer: Self-pay

## 2018-11-06 DIAGNOSIS — E1122 Type 2 diabetes mellitus with diabetic chronic kidney disease: Secondary | ICD-10-CM

## 2018-11-06 DIAGNOSIS — Z794 Long term (current) use of insulin: Secondary | ICD-10-CM

## 2018-11-06 MED ORDER — LIRAGLUTIDE 18 MG/3ML ~~LOC~~ SOPN: 0.6000 mg | PEN_INJECTOR | Freq: Every day | SUBCUTANEOUS | 0 refills | Status: DC

## 2018-11-06 NOTE — Progress Notes (Signed)
Pt dtr called to inform that there was confusion from [facility] about new Rx. States [facility] was not sure if they should fill 0.6mg  or 0.42mL. Sent Rx to provide clarity to new order. Also called [facility] to speak with the pharmacist. It seems the pt home delivery service filled old Rx 30 days ago and that the dtr was informed that the new Rx could not be filled until 11/15/18 unless she pays out of pocket. According to pharmacist, clarification of the order was not needed but rather that the dtr was confused that this is a refill too soon and not covered by insurance at this time. No further action required.

## 2018-11-06 NOTE — Patient Outreach (Signed)
[place] [facility]) Care Management  11/06/2018  Courtney Goodman 1945-08-13 [id number]  In-basket message received from [facility], [name], that patient would now like to pursue personal care services.  Patient requested that her daughter, [name], be contacted about this. Successful outreach today.  Spoke to daughter and patient.  Patient is unsure if assessment for pcs has been completed.  Provided them with contact information for [facility] so that they can call to verify.  Explained that assessment will need to be scheduled if it has not already occurred.  Explained that, if assessment, has occurred and patient was approved for services, agency needs to be selected.  Will follow up next week regarding status of services.  [name], BSW Social Worker [phone]

## 2018-11-10 ENCOUNTER — Other Ambulatory Visit: Payer: Self-pay

## 2018-11-10 NOTE — Patient Outreach (Addendum)
[place] [facility]) Care Management  11/10/2018  Courtney Goodman [name] May 27, 1945 [id number]   Follow up call to patient's daughter regarding status of request for personal care services.  Daughter reports that she recently broke her foot so she has been unable to contact [employer] to schedule assessment; she intends to call today. Will follow up again next week.  Addendum: Received call back from daughter.  She contacted [facility] to schedule assessment.  Request form will need to be sent again due to patient declining services when previously contacted for scheduling of assessment.  Request form faxed to [facility] again.   Will follow up in 48 hours to confirm receipt and processing.    [name], BSW Social Worker [phone]

## 2018-11-11 ENCOUNTER — Other Ambulatory Visit: Payer: Self-pay

## 2018-11-11 DIAGNOSIS — E782 Mixed hyperlipidemia: Secondary | ICD-10-CM | POA: Diagnosis not present

## 2018-11-11 DIAGNOSIS — D631 Anemia in chronic kidney disease: Secondary | ICD-10-CM | POA: Diagnosis not present

## 2018-11-11 DIAGNOSIS — E1122 Type 2 diabetes mellitus with diabetic chronic kidney disease: Secondary | ICD-10-CM | POA: Diagnosis not present

## 2018-11-11 DIAGNOSIS — N183 Chronic kidney disease, stage 3 unspecified: Secondary | ICD-10-CM | POA: Diagnosis not present

## 2018-11-11 DIAGNOSIS — J449 Chronic obstructive pulmonary disease, unspecified: Secondary | ICD-10-CM | POA: Diagnosis not present

## 2018-11-11 DIAGNOSIS — I2510 Atherosclerotic heart disease of native coronary artery without angina pectoris: Secondary | ICD-10-CM | POA: Diagnosis not present

## 2018-11-11 DIAGNOSIS — I10 Essential (primary) hypertension: Secondary | ICD-10-CM | POA: Diagnosis not present

## 2018-11-11 DIAGNOSIS — I499 Cardiac arrhythmia, unspecified: Secondary | ICD-10-CM | POA: Diagnosis not present

## 2018-11-11 DIAGNOSIS — F418 Other specified anxiety disorders: Secondary | ICD-10-CM | POA: Diagnosis not present

## 2018-11-12 ENCOUNTER — Other Ambulatory Visit: Payer: Self-pay

## 2018-11-12 NOTE — Patient Outreach (Signed)
[facility]) Care Management  11/12/2018  Courtney Goodman 06/13/1945 [id number]   [place] regarding request for personal care services.  Request was received and is being processed.  Patient will be contacted tomorrow or Monday for scheduling of assessment or patient can call to schedule.   Attempted to contact daughter to provide above information but no answer and could not leave voicemail message.  She did request during last outreach that a text message be sent if she did not answer.  Text was sent.  Will follow up again next week to ensure assessment has been scheduled.   [name], BSW Social Worker [phone]

## 2018-11-13 ENCOUNTER — Other Ambulatory Visit: Payer: Self-pay

## 2018-11-13 ENCOUNTER — Encounter: Payer: Self-pay | Admitting: Endocrinology

## 2018-11-13 ENCOUNTER — Ambulatory Visit (INDEPENDENT_AMBULATORY_CARE_PROVIDER_SITE_OTHER): Payer: Medicare HMO | Admitting: Endocrinology

## 2018-11-13 VITALS — BP 110/60 | HR 58 | Ht 64.0 in | Wt 163.0 lb

## 2018-11-13 DIAGNOSIS — E1122 Type 2 diabetes mellitus with diabetic chronic kidney disease: Secondary | ICD-10-CM

## 2018-11-13 DIAGNOSIS — N183 Chronic kidney disease, stage 3 unspecified: Secondary | ICD-10-CM

## 2018-11-13 DIAGNOSIS — Z794 Long term (current) use of insulin: Secondary | ICD-10-CM | POA: Diagnosis not present

## 2018-11-13 DIAGNOSIS — N1831 Chronic kidney disease, stage 3a: Secondary | ICD-10-CM

## 2018-11-13 DIAGNOSIS — E1121 Type 2 diabetes mellitus with diabetic nephropathy: Secondary | ICD-10-CM

## 2018-11-13 LAB — POCT GLYCOSYLATED HEMOGLOBIN (HGB A1C): Hemoglobin A1C: 10.4 % — AB (ref 4.0–5.6)

## 2018-11-13 MED ORDER — INSULIN DETEMIR 100 UNIT/ML FLEXPEN: 150.0000 [IU] | PEN_INJECTOR | SUBCUTANEOUS | 3 refills | Status: DC

## 2018-11-13 NOTE — Progress Notes (Signed)
Subjective:    Patient ID: Courtney Goodman, female    DOB: [birth year], 73 y.o.   MRN: [id number]  HPI Pt returns for f/u of diabetes mellitus: DM type: Insulin-requiring type 2 Dx'ed: 6256 Complications: renal insufficiency and CAD.  Therapy: insulin since 2009, and victoza. GDM: never DKA: never Severe hypoglycemia: 2012 and 2019.   Pancreatitis: never.  Other: she is on qd insulin, due to h/o noncompliance; ins declined tresiba.   Interval history: dtr says pt does not have victoza now, but they expect to obtain soon.   Ins has been declining to pay for refill, as they have been dealing with [facility], [facility], and [facility].  She takes 130 units each morning.  she brings a record of her cbg's which I have reviewed today.  cbg varies 129-400.  dtr makes accusations that we have not been properly been handling refill requests.  I pointed out that we have addressed large number of phone calls for a matter which is between them, ins, and pharmacies.  Past Medical History:  Diagnosis Date  . Anemia of chronic disease   . Asthma   . Atherosclerotic heart disease of native coronary artery without angina pectoris   . CKD (chronic kidney disease)   . COPD (chronic obstructive pulmonary disease) ([place])   . DM (diabetes mellitus), type 2 with renal complications ([place])   . Dysrhythmia   . Elevated ferritin 01/03/2017  . GAD (generalized anxiety disorder)   . Hypertension   . Mixed hyperlipidemia due to type 2 diabetes mellitus ([place])   . Mixed incontinence    per medical records from [place]  . Osteopenia   . Personal history of noncompliance with medical treatment, presenting hazards to health   . Shortness of breath dyspnea   . Suicidal ideation   . TB (tuberculosis), treated    age 77  . Vitamin D deficiency     Past Surgical History:  Procedure Laterality Date  . ANKLE RECONSTRUCTION     right  . CARPAL TUNNEL RELEASE    . CORONARY ANGIOPLASTY      Social History   Socioeconomic  History  . Marital status: Divorced    Spouse name: Not on file  . Number of children: 2  . Years of education: 67  . Highest education level: High school graduate  Occupational History  . Not on file  Social Needs  . Financial resource strain: Somewhat hard  . Food insecurity    Worry: Sometimes true    Inability: Sometimes true  . Transportation needs    Medical: No    Non-medical: No  Tobacco Use  . Smoking status: Former Research scientist (life sciences)  . Smokeless tobacco: Never Used  Substance and Sexual Activity  . Alcohol use: No  . Drug use: No  . Sexual activity: Never  Lifestyle  . Physical activity    Days per week: 0 days    Minutes per session: 0 min  . Stress: Very much  Relationships  . Social connections    Talks on phone: More than three times a week    Gets together: More than three times a week    Attends religious service: More than 4 times per year    Active member of club or organization: No    Attends meetings of clubs or organizations: Never    Relationship status: Divorced  . Intimate partner violence    Fear of current or ex partner: No    Emotionally abused: No  Physically abused: No    Forced sexual activity: No  Other Topics Concern  . Not on file  Social History Narrative  . Not on file    Current Outpatient Medications on File Prior to Visit  Medication Sig Dispense Refill  . albuterol (PROVENTIL HFA;VENTOLIN HFA) 108 (90 Base) MCG/ACT inhaler Inhale 2 puffs into the lungs every 6 (six) hours as needed for wheezing or shortness of breath. 1 Inhaler 0  . Alcohol Swabs (ALCOHOL PREP) PADS Use for testing 100 each 1  . aspirin EC 81 MG tablet Take 81 mg by mouth daily.     . Biotin 10000 MCG TABS Take 1 tablet by mouth daily.     . Blood Glucose Monitoring Suppl (TRUE METRIX METER) w/Device KIT 1 each by Does not apply route 2 (two) times daily. Use to monitor glucose levels BID; E11.29 1 kit 0  . carvedilol (COREG) 6.25 MG tablet Take 1 tablet by mouth  twice daily. (Patient taking differently: Take 6.25 mg by mouth 2 (two) times daily with a meal. ) 90 tablet 0  . cholecalciferol (VITAMIN D) 25 MCG (1000 UT) tablet Take 1 tablet by mouth daily. 90 tablet 0  . citalopram (CELEXA) 20 MG tablet Take 1 tablet (20 mg total) by mouth daily. 90 tablet 1  . fenofibrate (TRICOR) 145 MG tablet Take 1 tablet (145 mg total) by mouth daily. 90 tablet 0  . fluticasone (FLONASE) 50 MCG/ACT nasal spray Place 2 sprays into both nostrils daily. 48 g 0  . folic acid (FOLVITE) 1 MG tablet Take 1 tablet (1 mg total) by mouth daily. 90 tablet 0  . glucose blood (TRUE METRIX BLOOD GLUCOSE TEST) test strip 1 each by Other route 2 (two) times daily. Use to monitor glucose levels BID; E11.29 200 each 0  . Insulin Pen Needle 31G X 5 MM MISC Inject 1 each into the skin 2 (two) times daily. Use to inject insulin twice daily; E11.29 100 each 1  . omeprazole (PRILOSEC) 40 MG capsule Take 1 capsule (40 mg total) by mouth daily. 30 capsule 0  . simvastatin (ZOCOR) 20 MG tablet Take 1 tablet by mouth every night. (Patient taking differently: Take 20 mg by mouth daily at 6 PM. ) 90 tablet 0  . traZODone (DESYREL) 50 MG tablet Take 50 mg by mouth at bedtime.     . liraglutide (VICTOZA) 18 MG/3ML SOPN Inject 0.1 mLs (0.6 mg total) into the skin daily. THIS IS A NEW AND CORRECT RX AS OF 11/05/18. DO NOT FILL OLD RX'S (Patient not taking: Reported on 11/13/2018) 3 mL 0   No current facility-administered medications on file prior to visit.     No Known Allergies  Family History  Problem Relation Age of Onset  . Diabetes Mother     BP 110/60 (BP Location: Right Arm, Patient Position: Sitting, Cuff Size: Normal)   Pulse (!) 58   Ht _0  (1.626 m)   Wt 163 lb (73.9 kg)   SpO2 90%   BMI 27.98 kg/m    Review of Systems She denies hypoglycemia.      Objective:   Physical Exam VITAL SIGNS:  See vs page GENERAL: no distress Pulses: dorsalis pedis intact bilat.   MSK: no  deformity of the feet CV: no leg edema Skin:  no ulcer on the feet.  normal color and temp on the feet. Neuro: sensation is intact to touch on the feet Ext: there is bilateral onychomycosis of the  toenails, which are long.   Lab Results  Component Value Date   HGBA1C 10.4 (A) 11/13/2018      Assessment & Plan:  Insulin-requiring type 2 DM, with renal insuff: she needs increased rx.  Improper behavior: dtr will receive letter that failure to improve will result in d/c from practice.     Patient Instructions  Please increase the Levemir to 150 units each morning, and:  Resume the victoza at 0.6 units daily.  In 1 week, if no nausea, please increase to 1.2 units, and then to 1.8 units.   check your blood sugar twice a day.  vary the time of day when you check, between before the 3 meals, and at bedtime.  also check if you have symptoms of your blood sugar being too high or too low.  please keep a record of the readings and bring it to your next appointment here (or you can bring the meter itself).  You can write it on any piece of paper.  please call us sooner if your blood sugar goes below 70, or if you have a lot of readings over 200. check your blood sugar twice a day.  vary the time of day when you check, between before the 3 meals, and at bedtime.  also check if you have symptoms of your blood sugar being too high or too low.  please keep a record of the readings and bring it to your next appointment here (or you can bring the meter itself).  You can write it on any piece of paper.  please call us sooner if your blood sugar goes below 70, or if you have a lot of readings over 200. Please call or message [place] next week, to tell us how the blood sugar is doing.    Please come back for a follow-up appointment in 2 months.

## 2018-11-13 NOTE — Patient Instructions (Addendum)
Please increase the Levemir to 150 units each morning, and:  Resume the victoza at 0.6 units daily.  In 1 week, if no nausea, please increase to 1.2 units, and then to 1.8 units.   check your blood sugar twice a day.  vary the time of day when you check, between before the 3 meals, and at bedtime.  also check if you have symptoms of your blood sugar being too high or too low.  please keep a record of the readings and bring it to your next appointment here (or you can bring the meter itself).  You can write it on any piece of paper.  please call us sooner if your blood sugar goes below 70, or if you have a lot of readings over 200. check your blood sugar twice a day.  vary the time of day when you check, between before the 3 meals, and at bedtime.  also check if you have symptoms of your blood sugar being too high or too low.  please keep a record of the readings and bring it to your next appointment here (or you can bring the meter itself).  You can write it on any piece of paper.  please call us sooner if your blood sugar goes below 70, or if you have a lot of readings over 200. Please call or message [place] next week, to tell us how the blood sugar is doing.    Please come back for a follow-up appointment in 2 months.

## 2018-11-16 ENCOUNTER — Ambulatory Visit: Payer: Medicare HMO

## 2018-11-17 ENCOUNTER — Inpatient Hospital Stay: Payer: Medicare HMO

## 2018-11-17 ENCOUNTER — Other Ambulatory Visit: Payer: Self-pay

## 2018-11-17 ENCOUNTER — Ambulatory Visit (INDEPENDENT_AMBULATORY_CARE_PROVIDER_SITE_OTHER): Payer: Medicare HMO | Admitting: Podiatry

## 2018-11-17 ENCOUNTER — Ambulatory Visit: Payer: Medicare HMO

## 2018-11-17 ENCOUNTER — Encounter: Payer: Self-pay | Admitting: Podiatry

## 2018-11-17 VITALS — BP 136/69 | HR 58 | Temp 98.3°F | Resp 16 | Wt 165.2 lb

## 2018-11-17 DIAGNOSIS — Z79899 Other long term (current) drug therapy: Secondary | ICD-10-CM | POA: Diagnosis not present

## 2018-11-17 DIAGNOSIS — B351 Tinea unguium: Secondary | ICD-10-CM

## 2018-11-17 DIAGNOSIS — D631 Anemia in chronic kidney disease: Secondary | ICD-10-CM | POA: Diagnosis not present

## 2018-11-17 DIAGNOSIS — N183 Chronic kidney disease, stage 3 unspecified: Secondary | ICD-10-CM

## 2018-11-17 DIAGNOSIS — D638 Anemia in other chronic diseases classified elsewhere: Secondary | ICD-10-CM

## 2018-11-17 DIAGNOSIS — I129 Hypertensive chronic kidney disease with stage 1 through stage 4 chronic kidney disease, or unspecified chronic kidney disease: Secondary | ICD-10-CM | POA: Diagnosis not present

## 2018-11-17 DIAGNOSIS — M79609 Pain in unspecified limb: Secondary | ICD-10-CM

## 2018-11-17 DIAGNOSIS — M79676 Pain in unspecified toe(s): Secondary | ICD-10-CM | POA: Diagnosis not present

## 2018-11-17 DIAGNOSIS — N189 Chronic kidney disease, unspecified: Secondary | ICD-10-CM | POA: Diagnosis not present

## 2018-11-17 LAB — HEMOGLOBIN: Hemoglobin: 10.3 g/dL — ABNORMAL LOW (ref 12.0–15.0)

## 2018-11-17 MED ORDER — EPOETIN ALFA-EPBX 40000 UNIT/ML IJ SOLN
40000.0000 [IU] | Freq: Once | INTRAMUSCULAR | Status: AC
  Administered 2018-11-17: 40000 [IU] via SUBCUTANEOUS
  Filled 2018-11-17: qty 1

## 2018-11-17 NOTE — Patient Instructions (Signed)
Diabetes Mellitus and Foot Care Foot care is an important part of your health, especially when you have diabetes. Diabetes may cause you to have problems because of poor blood flow (circulation) to your feet and legs, which can cause your skin to:  Become thinner and drier.  Break more easily.  Heal more slowly.  Peel and crack. You may also have nerve damage (neuropathy) in your legs and feet, causing decreased feeling in them. This means that you may not notice minor injuries to your feet that could lead to more serious problems. Noticing and addressing any potential problems early is the best way to prevent future foot problems. How to care for your feet Foot hygiene  Wash your feet daily with warm water and mild soap. Do not use hot water. Then, pat your feet and the areas between your toes until they are completely dry. Do not soak your feet as this can dry your skin.  Trim your toenails straight across. Do not dig under them or around the cuticle. File the edges of your nails with an emery board or nail file.  Apply a moisturizing lotion or petroleum jelly to the skin on your feet and to dry, brittle toenails. Use lotion that does not contain alcohol and is unscented. Do not apply lotion between your toes. Shoes and socks  Wear clean socks or stockings every day. Make sure they are not too tight. Do not wear knee-high stockings since they may decrease blood flow to your legs.  Wear shoes that fit properly and have enough cushioning. Always look in your shoes before you put them on to be sure there are no objects inside.  To break in new shoes, wear them for just a few hours a day. This prevents injuries on your feet. Wounds, scrapes, corns, and calluses  Check your feet daily for blisters, cuts, bruises, sores, and redness. If you cannot see the bottom of your feet, use a mirror or ask someone for help.  Do not cut corns or calluses or try to remove them with medicine.  If you  find a minor scrape, cut, or break in the skin on your feet, keep it and the skin around it clean and dry. You may clean these areas with mild soap and water. Do not clean the area with peroxide, alcohol, or iodine.  If you have a wound, scrape, corn, or callus on your foot, look at it several times a day to make sure it is healing and not infected. Check for: ? Redness, swelling, or pain. ? Fluid or blood. ? Warmth. ? Pus or a bad smell. General instructions  Do not cross your legs. This may decrease blood flow to your feet.  Do not use heating pads or hot water bottles on your feet. They may burn your skin. If you have lost feeling in your feet or legs, you may not know this is happening until it is too late.  Protect your feet from hot and cold by wearing shoes, such as at the beach or on hot pavement.  Schedule a complete foot exam at least once a year (annually) or more often if you have foot problems. If you have foot problems, report any cuts, sores, or bruises to your health care provider immediately. Contact a health care provider if:  You have a medical condition that increases your risk of infection and you have any cuts, sores, or bruises on your feet.  You have an injury that is not   healing.  You have redness on your legs or feet.  You feel burning or tingling in your legs or feet.  You have pain or cramps in your legs and feet.  Your legs or feet are numb.  Your feet always feel cold.  You have pain around a toenail. Get help right away if:  You have a wound, scrape, corn, or callus on your foot and: ? You have pain, swelling, or redness that gets worse. ? You have fluid or blood coming from the wound, scrape, corn, or callus. ? Your wound, scrape, corn, or callus feels warm to the touch. ? You have pus or a bad smell coming from the wound, scrape, corn, or callus. ? You have a fever. ? You have a red line going up your leg. Summary  Check your feet every day  for cuts, sores, red spots, swelling, and blisters.  Moisturize feet and legs daily.  Wear shoes that fit properly and have enough cushioning.  If you have foot problems, report any cuts, sores, or bruises to your health care provider immediately.  Schedule a complete foot exam at least once a year (annually) or more often if you have foot problems. This information is not intended to replace advice given to you by your health care provider. Make sure you discuss any questions you have with your health care provider. Document Released: 01/12/2000 Document Revised: 02/26/2017 Document Reviewed: 02/16/2016 Elsevier Patient Education  2020 [employer]  

## 2018-11-17 NOTE — Progress Notes (Signed)
Subjective: Courtney Goodman is seen today for preventative diabetic foot care follow up of painful, elongated, thickened toenails 1-5 b/l feet that she cannot cut. Pain interferes with daily activities. Aggravating factor includes wearing enclosed shoe gear and relieved with periodic debridement.  Patient states she missed her last appointment due to illness. She was hospitalized for uncontrolled blood sugar. She has also suffered family loss since her last visit. She feels she is coping well.   Current Outpatient Medications on File Prior to Visit  Medication Sig  . albuterol (PROVENTIL HFA;VENTOLIN HFA) 108 (90 Base) MCG/ACT inhaler Inhale 2 puffs into the lungs every 6 (six) hours as needed for wheezing or shortness of breath.  . Alcohol Swabs (ALCOHOL PREP) PADS Use for testing  . amLODipine-benazepril (LOTREL) 5-20 MG capsule   . aspirin EC 81 MG tablet Take 81 mg by mouth daily.   . Biotin 10000 MCG TABS Take 1 tablet by mouth daily.   . Blood Glucose Monitoring Suppl (TRUE METRIX METER) w/Device KIT 1 each by Does not apply route 2 (two) times daily. Use to monitor glucose levels BID; E11.29  . carvedilol (COREG) 6.25 MG tablet Take 1 tablet by mouth twice daily. (Patient taking differently: Take 6.25 mg by mouth 2 (two) times daily with a meal. )  . cholecalciferol (VITAMIN D) 25 MCG (1000 UT) tablet Take 1 tablet by mouth daily.  . citalopram (CELEXA) 20 MG tablet Take 1 tablet (20 mg total) by mouth daily.  . fenofibrate (TRICOR) 145 MG tablet Take 1 tablet (145 mg total) by mouth daily.  . fluticasone (FLONASE) 50 MCG/ACT nasal spray Place 2 sprays into both nostrils daily.  . folic acid (FOLVITE) 1 MG tablet Take 1 tablet (1 mg total) by mouth daily.  [place] Kitchen glucose blood (TRUE METRIX BLOOD GLUCOSE TEST) test strip 1 each by Other route 2 (two) times daily. Use to monitor glucose levels BID; E11.29  . Insulin Detemir (LEVEMIR) 100 UNIT/ML Pen Inject 150 Units into the skin every morning.   . Insulin Pen Needle 31G X 5 MM MISC Inject 1 each into the skin 2 (two) times daily. Use to inject insulin twice daily; E11.29  . liraglutide (VICTOZA) 18 MG/3ML SOPN Inject 0.1 mLs (0.6 mg total) into the skin daily. THIS IS A NEW AND CORRECT RX AS OF 11/05/18. DO NOT FILL OLD RX'S (Patient not taking: Reported on 11/13/2018)  . omeprazole (PRILOSEC) 40 MG capsule Take 1 capsule (40 mg total) by mouth daily.  . simvastatin (ZOCOR) 20 MG tablet Take 1 tablet by mouth every night. (Patient taking differently: Take 20 mg by mouth daily at 6 PM. )  . traZODone (DESYREL) 50 MG tablet Take 50 mg by mouth at bedtime.    No current facility-administered medications on file prior to visit.      No Known Allergies   Objective:  Vascular Examination: Capillary refill time <3 seconds x 10 digits.  Dorsalis pedis present b/l.  Posterior tibial pulses present b/l.  Digital hair absent b/l.  Skin temperature gradient WNL b/l.   Dermatological Examination: Skin with normal turgor, texture and tone b/l.  Toenails 1-5 b/l discolored, thick, dystrophic with subungual debris and pain with palpation to nailbeds due to thickness of nails.  Musculoskeletal: Muscle strength 5/5 to all LE muscle groups b/l.  No gross bony deformities b/l.  No pain, crepitus or joint limitation noted with ROM.   Neurological Examination: Protective sensation intact with 10 gram monofilament bilaterally.  Assessment: Painful  onychomycosis toenails 1-5 b/l   Plan: 1. Continue diabetic foot care principles daily. 2. Toenails 1-5 b/l were debrided in length and girth without iatrogenic bleeding. 3. Patient to continue soft, supportive shoe gear. 4. Patient to report any pedal injuries to medical professional immediately. 5. Follow up 3 months.  6. Patient/POA to call should there be a concern in the interim.

## 2018-11-17 NOTE — Patient Instructions (Signed)
Epoetin Alfa injection What is this medicine? EPOETIN ALFA (e POE e tin AL fa) helps your body make more red blood cells. This medicine is used to treat anemia caused by chronic kidney disease, cancer chemotherapy, or HIV-therapy. It may also be used before surgery if you have anemia. This medicine may be used for other purposes; ask your health care provider or pharmacist if you have questions. COMMON BRAND NAME(S): Epogen, Procrit, Retacrit What should I tell my health care provider before I take this medicine? They need to know if you have any of these conditions:  cancer  heart disease  high blood pressure  history of blood clots  history of stroke  low levels of folate, iron, or vitamin B12 in the blood  seizures  an unusual or allergic reaction to erythropoietin, albumin, benzyl alcohol, hamster proteins, other medicines, foods, dyes, or preservatives  pregnant or trying to get pregnant  breast-feeding How should I use this medicine? This medicine is for injection into a vein or under the skin. It is usually given by a health care professional in a hospital or clinic setting. If you get this medicine at home, you will be taught how to prepare and give this medicine. Use exactly as directed. Take your medicine at regular intervals. Do not take your medicine more often than directed. It is important that you put your used needles and syringes in a special sharps container. Do not put them in a trash can. If you do not have a sharps container, call your pharmacist or healthcare provider to get one. A special MedGuide will be given to you by the pharmacist with each prescription and refill. Be sure to read this information carefully each time. Talk to your pediatrician regarding the use of this medicine in children. While this drug may be prescribed for selected conditions, precautions do apply. Overdosage: If you think you have taken too much of this medicine contact a poison  control center or emergency room at once. NOTE: This medicine is only for you. Do not share this medicine with others. What if I miss a dose? If you miss a dose, take it as soon as you can. If it is almost time for your next dose, take only that dose. Do not take double or extra doses. What may interact with this medicine? Interactions have not been studied. This list may not describe all possible interactions. Give your health care provider a list of all the medicines, herbs, non-prescription drugs, or dietary supplements you use. Also tell them if you smoke, drink alcohol, or use illegal drugs. Some items may interact with your medicine. What should I watch for while using this medicine? Your condition will be monitored carefully while you are receiving this medicine. You may need blood work done while you are taking this medicine. This medicine may cause a decrease in vitamin B6. You should make sure that you get enough vitamin B6 while you are taking this medicine. Discuss the foods you eat and the vitamins you take with your health care professional. What side effects may I notice from receiving this medicine? Side effects that you should report to your doctor or health care professional as soon as possible:  allergic reactions like skin rash, itching or hives, swelling of the face, lips, or tongue  seizures  signs and symptoms of a blood clot such as breathing problems; changes in vision; chest pain; severe, sudden headache; pain, swelling, warmth in the leg; trouble speaking; sudden numbness or   weakness of the face, arm or leg  signs and symptoms of a stroke like changes in vision; confusion; trouble speaking or understanding; severe headaches; sudden numbness or weakness of the face, arm or leg; trouble walking; dizziness; loss of balance or coordination Side effects that usually do not require medical attention (report to your doctor or health care professional if they continue or are  bothersome):  chills  cough  dizziness  fever  headaches  joint pain  muscle cramps  muscle pain  nausea, vomiting  pain, redness, or irritation at site where injected This list may not describe all possible side effects. Call your doctor for medical advice about side effects. You may report side effects to FDA at 1-800-FDA-1088. Where should I keep my medicine? Keep out of the reach of children. Store in a refrigerator between 2 and 8 degrees C (36 and 46 degrees F). Do not freeze or shake. Throw away any unused portion if using a single-dose vial. Multi-dose vials can be kept in the refrigerator for up to 21 days after the initial dose. Throw away unused medicine. NOTE: This sheet is a summary. It may not cover all possible information. If you have questions about this medicine, talk to your doctor, pharmacist, or health care provider.  2020 Elsevier/Gold Standard (2016-08-23 08:35 Coronavirus (COVID-19) Are you at risk?  Are you at risk for the Coronavirus (COVID-19)?  To be considered HIGH RISK for Coronavirus (COVID-19), you have to meet the following criteria:  . Traveled to [place], [place], [place], [place] or [place]; or in the [place] to [place], [place], [place], or [place]; and have fever, cough, and shortness of breath within the last 2 weeks of travel OR . Been in close contact with a person diagnosed with COVID-19 within the last 2 weeks and have fever, cough, and shortness of breath . IF YOU DO NOT MEET THESE CRITERIA, YOU ARE CONSIDERED LOW RISK FOR COVID-19.  What to do if you are HIGH RISK for COVID-19?  Marland Kitchen If you are having a medical emergency, call 911. . Seek medical care right away. Before you go to a doctor's office, urgent care or emergency department, call ahead and tell them about your recent travel, contact with someone diagnosed with COVID-19, and your symptoms. You should receive instructions from your physician's office regarding next  steps of care.  . When you arrive at healthcare provider, tell the healthcare staff immediately you have returned from visiting [place], [place], [place], [place] or [place]; or traveled in the [place] to [place], [place], [place], or [place]; in the last two weeks or you have been in close contact with a person diagnosed with COVID-19 in the last 2 weeks.   . Tell the health care staff about your symptoms: fever, cough and shortness of breath. . After you have been seen by a medical provider, you will be either: o Tested for (COVID-19) and discharged home on quarantine except to seek medical care if symptoms worsen, and asked to  - Stay home and avoid contact with others until you get your results (4-5 days)  - Avoid travel on public transportation if possible (such as bus, train, or airplane) or o Sent to the Emergency Department by EMS for evaluation, COVID-19 testing, and possible admission depending on your condition and test results.  What to do if you are LOW RISK for COVID-19?  Reduce your risk of any infection by using the same precautions used for  avoiding the common cold or flu:  Marland Kitchen Wash your hands often with soap and warm water for at least 20 seconds.  If soap and water are not readily available, use an alcohol-based hand sanitizer with at least 60% alcohol.  . If coughing or sneezing, cover your mouth and nose by coughing or sneezing into the elbow areas of your shirt or coat, into a tissue or into your sleeve (not your hands). . Avoid shaking hands with others and consider head nods or verbal greetings only. . Avoid touching your eyes, nose, or mouth with unwashed hands.  . Avoid close contact with people who are sick. . Avoid places or events with large numbers of people in one location, like concerts or sporting events. . Carefully consider travel plans you have or are making. . If you are planning any travel outside or inside the [place], visit the CDC's Travelers' Health  webpage for the latest health notices. . If you have some symptoms but not all symptoms, continue to monitor at home and seek medical attention if your symptoms worsen. . If you are having a medical emergency, call 911.   [place] / e-Visit: [email or web address]         [facility]: [facility]: [phone]                   [facility]: [phone]

## 2018-11-18 ENCOUNTER — Telehealth: Payer: Self-pay | Admitting: Family Medicine

## 2018-11-18 MED ORDER — OMEPRAZOLE 40 MG PO CPDR: 40.0000 mg | DELAYED_RELEASE_CAPSULE | Freq: Every day | ORAL | 1 refills | Status: DC

## 2018-11-18 NOTE — Telephone Encounter (Signed)
Pillpack req refill Omeprazole 40 mg    #90 day

## 2018-11-18 NOTE — Telephone Encounter (Signed)
Sent in med

## 2018-11-19 ENCOUNTER — Other Ambulatory Visit: Payer: Self-pay

## 2018-11-19 DIAGNOSIS — E782 Mixed hyperlipidemia: Secondary | ICD-10-CM | POA: Diagnosis not present

## 2018-11-19 DIAGNOSIS — E1122 Type 2 diabetes mellitus with diabetic chronic kidney disease: Secondary | ICD-10-CM | POA: Diagnosis not present

## 2018-11-19 DIAGNOSIS — D631 Anemia in chronic kidney disease: Secondary | ICD-10-CM | POA: Diagnosis not present

## 2018-11-19 DIAGNOSIS — I10 Essential (primary) hypertension: Secondary | ICD-10-CM | POA: Diagnosis not present

## 2018-11-19 DIAGNOSIS — J449 Chronic obstructive pulmonary disease, unspecified: Secondary | ICD-10-CM | POA: Diagnosis not present

## 2018-11-19 DIAGNOSIS — I2510 Atherosclerotic heart disease of native coronary artery without angina pectoris: Secondary | ICD-10-CM | POA: Diagnosis not present

## 2018-11-19 DIAGNOSIS — I499 Cardiac arrhythmia, unspecified: Secondary | ICD-10-CM | POA: Diagnosis not present

## 2018-11-19 DIAGNOSIS — N183 Chronic kidney disease, stage 3 unspecified: Secondary | ICD-10-CM | POA: Diagnosis not present

## 2018-11-19 DIAGNOSIS — F418 Other specified anxiety disorders: Secondary | ICD-10-CM | POA: Diagnosis not present

## 2018-11-19 NOTE — Patient Outreach (Signed)
[facility]) Care Management  [place]  11/19/2018   Shannah Conteh Cavagnaro 1945-04-19 [id number]  Subjective:  Telephone call to daughter [name]. She reports that things are better with her mother and that her sugars are less than 200.  She reports that writing notes has been good for patient in taking her correct insulin and victoza dosing.  She reports that PCS assessment done yesterday and knows that letter will follow.  Discussed PCS and how this will be beneficial to patient.  Also discussed importance of diet and medication compliance.  Most recent A1c was 10.4.  Reiterated need to follow diabetic diet and limiting carbohydrates and sweets.  Daughter verbalized understanding and states that patient scheduled for dietitian in December but patient is on call for any cancellations.  Patient scheduled to see Dr. Loanne Drilling again in December per daughter.  Daughter offers no concerns.    Objective:   Encounter Medications:  Outpatient Encounter Medications as of 11/19/2018  Medication Sig  . albuterol (PROVENTIL HFA;VENTOLIN HFA) 108 (90 Base) MCG/ACT inhaler Inhale 2 puffs into the lungs every 6 (six) hours as needed for wheezing or shortness of breath.  . Alcohol Swabs (ALCOHOL PREP) PADS Use for testing  . amLODipine-benazepril (LOTREL) 5-20 MG capsule   . aspirin EC 81 MG tablet Take 81 mg by mouth daily.   . Biotin 10000 MCG TABS Take 1 tablet by mouth daily.   . Blood Glucose Monitoring Suppl (TRUE METRIX METER) w/Device KIT 1 each by Does not apply route 2 (two) times daily. Use to monitor glucose levels BID; E11.29  . carvedilol (COREG) 6.25 MG tablet Take 1 tablet by mouth twice daily. (Patient taking differently: Take 6.25 mg by mouth 2 (two) times daily with a meal. )  . cholecalciferol (VITAMIN D) 25 MCG (1000 UT) tablet Take 1 tablet by mouth daily.  . citalopram (CELEXA) 20 MG tablet Take 1 tablet (20 mg total) by mouth daily.  . fenofibrate (TRICOR) 145 MG  tablet Take 1 tablet (145 mg total) by mouth daily.  . fluticasone (FLONASE) 50 MCG/ACT nasal spray Place 2 sprays into both nostrils daily.  . folic acid (FOLVITE) 1 MG tablet Take 1 tablet (1 mg total) by mouth daily.  Marland Kitchen glucose blood (TRUE METRIX BLOOD GLUCOSE TEST) test strip 1 each by Other route 2 (two) times daily. Use to monitor glucose levels BID; E11.29  . Insulin Detemir (LEVEMIR) 100 UNIT/ML Pen Inject 150 Units into the skin every morning.  . Insulin Pen Needle 31G X 5 MM MISC Inject 1 each into the skin 2 (two) times daily. Use to inject insulin twice daily; E11.29  . liraglutide (VICTOZA) 18 MG/3ML SOPN Inject 0.1 mLs (0.6 mg total) into the skin daily. THIS IS A NEW AND CORRECT RX AS OF 11/05/18. DO NOT FILL OLD RX'S  . omeprazole (PRILOSEC) 40 MG capsule Take 1 capsule (40 mg total) by mouth daily.  . simvastatin (ZOCOR) 20 MG tablet Take 1 tablet by mouth every night. (Patient taking differently: Take 20 mg by mouth daily at 6 PM. )  . traZODone (DESYREL) 50 MG tablet Take 50 mg by mouth at bedtime.    No facility-administered encounter medications on file as of 11/19/2018.     Functional Status:  In your present state of health, do you have any difficulty performing the following activities: 11/19/2018 10/12/2018  Hearing? Y Y  Comment - -  Vision? N Y  Difficulty concentrating or making decisions? Aggie Moats  Comment - -  Walking or climbing stairs? N Y  Comment - -  Dressing or bathing? Y Y  Comment tiredness -  Doing errands, shopping? [name]  Comment daughter handles -  Conservation officer, nature and eating ? N -  Using the Toilet? N -  In the past six months, have you accidently leaked urine? N -  Do you have problems with loss of bowel control? N -  Managing your Medications? N -  Managing your Finances? N -  Comment - -  Housekeeping or managing your Housekeeping? Y -  Comment daughter helps -  Some recent data might be hidden    Fall/Depression Screening: Fall Risk   10/12/2018 10/08/2018 10/02/2018  Falls in the past year? 0 0 0  Number falls in past yr: 0 0 0  Injury with Fall? 0 0 0  Risk Factor Category  - - -  Risk for fall due to : - Impaired balance/gait;Impaired mobility -  Follow up - Education provided;Falls prevention discussed -   PHQ 2/9 Scores 10/12/2018 10/08/2018 10/01/2018 06/04/2018 06/03/2018 04/01/2018 02/16/2018  PHQ - 2 Score _0 0 0 0  PHQ- 9 Score 24 - - - - - -    Assessment: Daughter currently involved in patient care and disease management. Patient has improved in disease management with the assist of her daughter.    Plan:  . [facility] CM Care Plan Problem One     Most Recent Value  Care Plan for Problem One  Not Active    [facility] CM Care Plan Problem Two     Most Recent Value  Care Plan Problem Two  Knowlege deficit regarding managment of diabetes as evidenced by elevated A1C  Role Documenting the Problem Two  Care Management Telephonic [place] for Problem Two  Active  Interventions for Problem Two Long Term Goal   RN CM discussed with daughter blood sugar readings and insulin dose.  Patient to see dietitian and endocrinologist in Dec.  Reiterated diet compliance importance.  Discussed benefit of possible PCS services.  [facility] Long Term Goal  Member's A1C will decrease </= 8 within the next 3 months  [facility] Long Term Goal Start Date  11/05/18     RN CM will contact in the month of November and daughter agrees.   RN CM will send update to physician.    Jone Baseman, RN, MSN [place] Management Care Management Coordinator Direct Line [phone] Cell [phone] Toll Free: [phone]  Fax: [phone]

## 2018-11-19 NOTE — Patient Outreach (Signed)
[facility]) Care Management  11/19/2018  Bona Hubbard Hubbell 03-Aug-1945 [id number]  Successful follow up call to patient's daughter today.  Assessment for pcs occurred yesterday and patient is awaiting letter of determination. Will follow up with daughter again within the next two weeks regarding determination.  Will likely close [place] Work case at that time if pcs have been initiated.    [name], BSW Social Worker [phone]

## 2018-11-20 DIAGNOSIS — I10 Essential (primary) hypertension: Secondary | ICD-10-CM | POA: Diagnosis not present

## 2018-11-20 DIAGNOSIS — J449 Chronic obstructive pulmonary disease, unspecified: Secondary | ICD-10-CM | POA: Diagnosis not present

## 2018-11-20 DIAGNOSIS — E1122 Type 2 diabetes mellitus with diabetic chronic kidney disease: Secondary | ICD-10-CM | POA: Diagnosis not present

## 2018-11-20 DIAGNOSIS — I499 Cardiac arrhythmia, unspecified: Secondary | ICD-10-CM | POA: Diagnosis not present

## 2018-11-20 DIAGNOSIS — F418 Other specified anxiety disorders: Secondary | ICD-10-CM | POA: Diagnosis not present

## 2018-11-20 DIAGNOSIS — I2510 Atherosclerotic heart disease of native coronary artery without angina pectoris: Secondary | ICD-10-CM | POA: Diagnosis not present

## 2018-11-20 DIAGNOSIS — E782 Mixed hyperlipidemia: Secondary | ICD-10-CM | POA: Diagnosis not present

## 2018-11-20 DIAGNOSIS — D631 Anemia in chronic kidney disease: Secondary | ICD-10-CM | POA: Diagnosis not present

## 2018-11-20 DIAGNOSIS — N183 Chronic kidney disease, stage 3 unspecified: Secondary | ICD-10-CM | POA: Diagnosis not present

## 2018-11-24 DIAGNOSIS — E782 Mixed hyperlipidemia: Secondary | ICD-10-CM | POA: Diagnosis not present

## 2018-11-24 DIAGNOSIS — I10 Essential (primary) hypertension: Secondary | ICD-10-CM | POA: Diagnosis not present

## 2018-11-24 DIAGNOSIS — I2510 Atherosclerotic heart disease of native coronary artery without angina pectoris: Secondary | ICD-10-CM | POA: Diagnosis not present

## 2018-11-24 DIAGNOSIS — D631 Anemia in chronic kidney disease: Secondary | ICD-10-CM | POA: Diagnosis not present

## 2018-11-24 DIAGNOSIS — E1122 Type 2 diabetes mellitus with diabetic chronic kidney disease: Secondary | ICD-10-CM | POA: Diagnosis not present

## 2018-11-24 DIAGNOSIS — N183 Chronic kidney disease, stage 3 unspecified: Secondary | ICD-10-CM | POA: Diagnosis not present

## 2018-11-24 DIAGNOSIS — F418 Other specified anxiety disorders: Secondary | ICD-10-CM | POA: Diagnosis not present

## 2018-11-24 DIAGNOSIS — I499 Cardiac arrhythmia, unspecified: Secondary | ICD-10-CM | POA: Diagnosis not present

## 2018-11-24 DIAGNOSIS — J449 Chronic obstructive pulmonary disease, unspecified: Secondary | ICD-10-CM | POA: Diagnosis not present

## 2018-11-26 ENCOUNTER — Other Ambulatory Visit: Payer: Self-pay

## 2018-11-26 ENCOUNTER — Other Ambulatory Visit: Payer: Self-pay | Admitting: Family Medicine

## 2018-11-26 DIAGNOSIS — Z794 Long term (current) use of insulin: Secondary | ICD-10-CM

## 2018-11-26 DIAGNOSIS — N183 Chronic kidney disease, stage 3 unspecified: Secondary | ICD-10-CM

## 2018-11-26 DIAGNOSIS — E1122 Type 2 diabetes mellitus with diabetic chronic kidney disease: Secondary | ICD-10-CM

## 2018-11-26 MED ORDER — INSULIN PEN NEEDLE 31G X 5 MM MISC: 1.0000 | Freq: Two times a day (BID) | 1 refills | Status: DC

## 2018-12-01 ENCOUNTER — Inpatient Hospital Stay: Payer: Medicare HMO | Attending: Oncology

## 2018-12-01 ENCOUNTER — Inpatient Hospital Stay: Payer: Medicare HMO

## 2018-12-01 ENCOUNTER — Inpatient Hospital Stay: Payer: Medicare HMO | Admitting: Internal Medicine

## 2018-12-01 DIAGNOSIS — N183 Chronic kidney disease, stage 3 unspecified: Secondary | ICD-10-CM | POA: Insufficient documentation

## 2018-12-01 DIAGNOSIS — I129 Hypertensive chronic kidney disease with stage 1 through stage 4 chronic kidney disease, or unspecified chronic kidney disease: Secondary | ICD-10-CM | POA: Insufficient documentation

## 2018-12-01 DIAGNOSIS — D631 Anemia in chronic kidney disease: Secondary | ICD-10-CM | POA: Insufficient documentation

## 2018-12-01 DIAGNOSIS — Z79899 Other long term (current) drug therapy: Secondary | ICD-10-CM | POA: Insufficient documentation

## 2018-12-02 ENCOUNTER — Telehealth: Payer: Self-pay | Admitting: Internal Medicine

## 2018-12-02 ENCOUNTER — Ambulatory Visit: Payer: Medicare HMO | Admitting: Endocrinology

## 2018-12-02 NOTE — Telephone Encounter (Signed)
Returned patient's phone call regarding rescheduling an appointment, left a voicemail. 

## 2018-12-03 ENCOUNTER — Other Ambulatory Visit: Payer: Self-pay

## 2018-12-03 NOTE — Patient Outreach (Signed)
[place]) Care Management  12/03/2018  Courtney Goodman [birth year] [id number]   Successful follow up call to patient's daughter today.  Patient received determination letter from [facility] but has not yet chosen agency to provide pcs.  Will follow up again within the next three weeks to ensure services have initiated.    [name], BSW Social Worker [phone]

## 2018-12-04 ENCOUNTER — Telehealth: Payer: Self-pay | Admitting: Endocrinology

## 2018-12-04 NOTE — Telephone Encounter (Signed)
Please take just Levemir and victoza.

## 2018-12-04 NOTE — Telephone Encounter (Signed)
I can't explain why she received this. It does not appear thi medication is on her medication list. Please advise

## 2018-12-04 NOTE — Telephone Encounter (Signed)
Patient ph# [phone] received a shipment of Tresiba Flextouch pen but patient has been taking Levemire. Patient is confused as to why she received the [place] listed above and requests that her daughter [name] be called at ph# [phone] to be advised re: what medication should patient be taking. Patient will not take any medication until she been advised.

## 2018-12-04 NOTE — Telephone Encounter (Signed)
Notified patient's daughter of instructions from Dr. Loanne Drilling to just take Wilder.

## 2018-12-11 ENCOUNTER — Other Ambulatory Visit: Payer: Self-pay | Admitting: Endocrinology

## 2018-12-11 DIAGNOSIS — E1122 Type 2 diabetes mellitus with diabetic chronic kidney disease: Secondary | ICD-10-CM

## 2018-12-11 DIAGNOSIS — N183 Chronic kidney disease, stage 3 unspecified: Secondary | ICD-10-CM

## 2018-12-11 DIAGNOSIS — Z794 Long term (current) use of insulin: Secondary | ICD-10-CM

## 2018-12-15 ENCOUNTER — Ambulatory Visit (HOSPITAL_COMMUNITY)
Admission: RE | Admit: 2018-12-15 | Discharge: 2018-12-15 | Disposition: A | Discharge status: Home / Self Care | Payer: Medicare HMO | Source: Ambulatory Visit | Attending: Medical | Admitting: Medical

## 2018-12-15 ENCOUNTER — Other Ambulatory Visit: Payer: Self-pay | Admitting: Medical

## 2018-12-15 ENCOUNTER — Other Ambulatory Visit: Payer: Self-pay

## 2018-12-15 ENCOUNTER — Inpatient Hospital Stay: Payer: Medicare HMO

## 2018-12-15 ENCOUNTER — Telehealth: Payer: Self-pay | Admitting: *Deleted

## 2018-12-15 ENCOUNTER — Ambulatory Visit (HOSPITAL_BASED_OUTPATIENT_CLINIC_OR_DEPARTMENT_OTHER): Payer: Medicare HMO | Admitting: Medical

## 2018-12-15 VITALS — BP 116/63 | HR 74 | Temp 98.0°F | Resp 18

## 2018-12-15 DIAGNOSIS — S6991XA Unspecified injury of right wrist, hand and finger(s), initial encounter: Secondary | ICD-10-CM | POA: Diagnosis not present

## 2018-12-15 DIAGNOSIS — I129 Hypertensive chronic kidney disease with stage 1 through stage 4 chronic kidney disease, or unspecified chronic kidney disease: Secondary | ICD-10-CM | POA: Diagnosis not present

## 2018-12-15 DIAGNOSIS — M79601 Pain in right arm: Secondary | ICD-10-CM

## 2018-12-15 DIAGNOSIS — D631 Anemia in chronic kidney disease: Secondary | ICD-10-CM | POA: Diagnosis not present

## 2018-12-15 DIAGNOSIS — D638 Anemia in other chronic diseases classified elsewhere: Secondary | ICD-10-CM

## 2018-12-15 DIAGNOSIS — Z79899 Other long term (current) drug therapy: Secondary | ICD-10-CM | POA: Diagnosis not present

## 2018-12-15 DIAGNOSIS — S59911A Unspecified injury of right forearm, initial encounter: Secondary | ICD-10-CM | POA: Diagnosis not present

## 2018-12-15 DIAGNOSIS — N183 Chronic kidney disease, stage 3 unspecified: Secondary | ICD-10-CM

## 2018-12-15 DIAGNOSIS — M25531 Pain in right wrist: Secondary | ICD-10-CM | POA: Diagnosis not present

## 2018-12-15 DIAGNOSIS — M79631 Pain in right forearm: Secondary | ICD-10-CM | POA: Diagnosis not present

## 2018-12-15 DIAGNOSIS — M79641 Pain in right hand: Secondary | ICD-10-CM | POA: Diagnosis not present

## 2018-12-15 DIAGNOSIS — S4991XA Unspecified injury of right shoulder and upper arm, initial encounter: Secondary | ICD-10-CM | POA: Diagnosis not present

## 2018-12-15 LAB — CBC WITH DIFFERENTIAL (CANCER CENTER ONLY)
Abs Immature Granulocytes: 0.02 10*3/uL (ref 0.00–0.07)
Basophils Absolute: 0.1 10*3/uL (ref 0.0–0.1)
Basophils Relative: 1 %
Eosinophils Absolute: 0.2 10*3/uL (ref 0.0–0.5)
Eosinophils Relative: 2 %
HCT: 34 % — ABNORMAL LOW (ref 36.0–46.0)
Hemoglobin: 10.9 g/dL — ABNORMAL LOW (ref 12.0–15.0)
Immature Granulocytes: 0 %
Lymphocytes Relative: 19 %
Lymphs Abs: 1.5 10*3/uL (ref 0.7–4.0)
MCH: 29.7 pg (ref 26.0–34.0)
MCHC: 32.1 g/dL (ref 30.0–36.0)
MCV: 92.6 fL (ref 80.0–100.0)
Monocytes Absolute: 0.6 10*3/uL (ref 0.1–1.0)
Monocytes Relative: 7 %
Neutro Abs: 5.6 10*3/uL (ref 1.7–7.7)
Neutrophils Relative %: 71 %
Platelet Count: 326 10*3/uL (ref 150–400)
RBC: 3.67 MIL/uL — ABNORMAL LOW (ref 3.87–5.11)
RDW: 15.7 % — ABNORMAL HIGH (ref 11.5–15.5)
WBC Count: 7.9 10*3/uL (ref 4.0–10.5)
nRBC: 0 % (ref 0.0–0.2)

## 2018-12-15 LAB — IRON AND TIBC
Iron: 81 ug/dL (ref 41–142)
Saturation Ratios: 25 % (ref 21–57)
TIBC: 321 ug/dL (ref 236–444)
UIBC: 240 ug/dL (ref 120–384)

## 2018-12-15 LAB — FERRITIN: Ferritin: 361 ng/mL — ABNORMAL HIGH (ref 11–307)

## 2018-12-15 MED ORDER — EPOETIN ALFA-EPBX 40000 UNIT/ML IJ SOLN
INTRAMUSCULAR | Status: AC
  Filled 2018-12-15: qty 1

## 2018-12-15 MED ORDER — EPOETIN ALFA-EPBX 40000 UNIT/ML IJ SOLN
40000.0000 [IU] | Freq: Once | INTRAMUSCULAR | Status: AC
  Administered 2018-12-15: 10:00:00 40000 [IU] via SUBCUTANEOUS

## 2018-12-15 NOTE — Telephone Encounter (Signed)
Returned call to pt, unable to reach. No vm available to leave message

## 2018-12-15 NOTE — Patient Outreach (Signed)
[facility]) Care Management  [place]  12/15/2018   Courtney Goodman 1945-11-08 [id number]  Subjective: Telephone call to daughter [name].  She is able to verify HIPAA.  She states that patient is doing good.  She did have a low blood sugar on yesterday but it was corrected with snack. Patient blood sugars remain less than 200. Daughter states that patient did receive her victoza and is taking it. Patient to see dietitian this month on the 20th and will see the endocrinologist next month.  Discussed continuing current regimen.  She verbalized understanding.  They are still waiting for reply from [facility].  She denies any needs.    Objective:   Encounter Medications:  Outpatient Encounter Medications as of 12/15/2018  Medication Sig  . albuterol (PROVENTIL HFA;VENTOLIN HFA) 108 (90 Base) MCG/ACT inhaler Inhale 2 puffs into the lungs every 6 (six) hours as needed for wheezing or shortness of breath.  . Alcohol Swabs (ALCOHOL PREP) PADS Use for testing  . amLODipine-benazepril (LOTREL) 5-20 MG capsule   . aspirin EC 81 MG tablet Take 81 mg by mouth daily.   . Biotin 10000 MCG TABS Take 1 tablet by mouth daily.   . Blood Glucose Monitoring Suppl (TRUE METRIX METER) w/Device KIT 1 each by Does not apply route 2 (two) times daily. Use to monitor glucose levels BID; E11.29  . carvedilol (COREG) 6.25 MG tablet Take 1 tablet by mouth twice daily.  . cholecalciferol (VITAMIN D) 25 MCG (1000 UT) tablet Take 1 tablet by mouth daily.  . citalopram (CELEXA) 20 MG tablet Take 1 tablet (20 mg total) by mouth daily.  . fenofibrate (TRICOR) 145 MG tablet Take 1 tablet by mouth daily.  . fluticasone (FLONASE) 50 MCG/ACT nasal spray Place 2 sprays into both nostrils daily.  . folic acid (FOLVITE) 1 MG tablet Take 1 tablet (1 mg total) by mouth daily.  . Insulin Detemir (LEVEMIR) 100 UNIT/ML Pen Inject 150 Units into the skin every morning.  . Insulin Pen Needle 31G X 5 MM MISC Inject 1  each into the skin 2 (two) times daily. Use to inject insulin twice daily; E11.29  . liraglutide (VICTOZA) 18 MG/3ML SOPN Inject 0.1 mLs (0.6 mg total) into the skin daily. THIS IS A NEW AND CORRECT RX AS OF 11/05/18. DO NOT FILL OLD RX'S  . omeprazole (PRILOSEC) 40 MG capsule Take 1 capsule (40 mg total) by mouth daily.  . simvastatin (ZOCOR) 20 MG tablet Take 1 tablet by mouth every night.  . traZODone (DESYREL) 50 MG tablet Take 50 mg by mouth at bedtime.   . TRUE METRIX BLOOD GLUCOSE TEST test strip TEST TWICE DAILY   Facility-Administered Encounter Medications as of 12/15/2018  Medication  . epoetin alfa-epbx (RETACRIT) injection 40,000 Units    Functional Status:  In your present state of health, do you have any difficulty performing the following activities: 11/19/2018 10/12/2018  Hearing? Y Y  Comment - -  Vision? N Y  Difficulty concentrating or making decisions? N Y  Comment - -  Walking or climbing stairs? N Y  Comment - -  Dressing or bathing? Y Y  Comment tiredness -  Doing errands, shopping? [name]  Comment daughter handles -  Conservation officer, nature and eating ? N -  Using the Toilet? N -  In the past six months, have you accidently leaked urine? N -  Do you have problems with loss of bowel control? N -  Managing your Medications?  N -  Managing your Finances? N -  Comment - -  Housekeeping or managing your Housekeeping? Y -  Comment daughter helps -  Some recent data might be hidden    Fall/Depression Screening: Fall Risk  10/12/2018 10/08/2018 10/02/2018  Falls in the past year? 0 0 0  Number falls in past yr: 0 0 0  Injury with Fall? 0 0 0  Risk Factor Category  - - -  Risk for fall due to : - Impaired balance/gait;Impaired mobility -  Follow up - Education provided;Falls prevention discussed -   PHQ 2/9 Scores 10/12/2018 10/08/2018 10/01/2018 06/04/2018 06/03/2018 04/01/2018 02/16/2018  PHQ - 2 Score 6 1 1 1  0 0 0  PHQ- 9 Score 24 - - - - - -    Assessment:  Patient continues  to manage disease processes with assistance of family.   Plan:  [facility] CM Care Plan Problem Two     Most Recent Value  Care Plan Problem Two  Knowlege deficit regarding managment of diabetes as evidenced by elevated A1C  Role Documenting the Problem Two  Care Management Telephonic Coordinator  Care Plan for Problem Two  Active  Interventions for Problem Two Long Term Goal   Reviewed blood sugar readings.  Patient now has victoza.  Patient to see dietitian this month.  Encouraged diet control as well.    [facility] Long Term Goal  Member's A1C will decrease </= 8 within the next 3 months  [facility] Long Term Goal Start Date  11/05/18     RN CM will outreach next month and daughter agreeable.    Jone Baseman, RN, MSN [place] Management Care Management Coordinator Direct Line [phone] Cell [phone] Toll Free: [phone]  Fax: [phone]

## 2018-12-17 NOTE — Progress Notes (Signed)
The patient presents for an Aranesp injection today.  She reports that she had a fall at home.  She was referred for x-rays of the right upper extremity and then was supposed to be seen in the clinic.  Her x-rays returned showing no evidence of a fracture or acute findings.  There was some evidence of arthritis.  She left without being seen.  The results of her x-rays were called to her.    Sandi Mealy, MHS, PA-C Physician Assistant

## 2018-12-18 ENCOUNTER — Ambulatory Visit: Payer: Medicare HMO | Admitting: Dietician

## 2018-12-18 ENCOUNTER — Telehealth: Payer: Self-pay | Admitting: Family Medicine

## 2018-12-18 DIAGNOSIS — R410 Disorientation, unspecified: Secondary | ICD-10-CM | POA: Diagnosis not present

## 2018-12-18 DIAGNOSIS — Z91199 Patient's noncompliance with other medical treatment and regimen due to unspecified reason: Secondary | ICD-10-CM

## 2018-12-18 DIAGNOSIS — E161 Other hypoglycemia: Secondary | ICD-10-CM | POA: Diagnosis not present

## 2018-12-18 DIAGNOSIS — Z9119 Patient's noncompliance with other medical treatment and regimen: Secondary | ICD-10-CM

## 2018-12-18 DIAGNOSIS — E162 Hypoglycemia, unspecified: Secondary | ICD-10-CM | POA: Diagnosis not present

## 2018-12-18 NOTE — Telephone Encounter (Signed)
Please call and see if she is still getting home health services through [facility] and if not, can we put in a new referral to have them check on her?

## 2018-12-18 NOTE — Telephone Encounter (Signed)
Pts home health nurse called and wanted you to know pt was found unconscious this morning. Her sugar was low and she was given some insulin. She refused transportation to the hospital and her daughter was at home with her. The nurse is concerned that pt may not be eating properly because she is normally home alone.

## 2018-12-18 NOTE — Telephone Encounter (Signed)
Spoke to patient and she stated she is a little weak today. She also stated that home health is not coming to the home and she would like a referral to be placed for someone to come out.

## 2018-12-20 NOTE — Telephone Encounter (Signed)
Please order home health for her as she requests. She will need a nursing evaluation most likely to see what her needs are at this point. She may need OT or PT as well. This may be through [facility] or not, whichever option works best is fine. Thanks.

## 2018-12-21 ENCOUNTER — Other Ambulatory Visit: Payer: Self-pay

## 2018-12-21 ENCOUNTER — Other Ambulatory Visit: Payer: Self-pay | Admitting: Family Medicine

## 2018-12-21 NOTE — Telephone Encounter (Signed)
I [facility]

## 2018-12-21 NOTE — Patient Outreach (Signed)
[facility]) Care Management  12/21/2018  Courtney Goodman [place] Oct 22, 1945 [id number]   Follow up call to patient's daughter regarding status of request for pcs.   Per daughter, patient received Determination Letter from [employer].  Daughter was under the impression that patient was approved for services, however, she stated that [employer] was requesting additional information from her MD.   Courtney Goodman [facility] for clarification.  Per [facility] representative, patient was not approved for pcs.  Reason for denial should have been detailed in determination letter.  Called daughter back to inform her of this.  Encouraged her to review determination letter again.  Informed daughter that eligibility for services is based on need for assistance with bathing, dressing, mobility, toileting, and eating.  Informed daughter that if pcs is not approved by [facility], unfortunately, services are not covered by Medicaid. Patient is not able to afford out-of-pocket cost for services.  Social work case is being closed at this time but daughter was encouraged to call if additional needs arise.  Courtney Goodman, BSW Social Worker [phone]

## 2018-12-22 ENCOUNTER — Telehealth: Payer: Self-pay | Admitting: Endocrinology

## 2018-12-22 ENCOUNTER — Other Ambulatory Visit: Payer: Self-pay

## 2018-12-22 DIAGNOSIS — E1122 Type 2 diabetes mellitus with diabetic chronic kidney disease: Secondary | ICD-10-CM

## 2018-12-22 DIAGNOSIS — Z794 Long term (current) use of insulin: Secondary | ICD-10-CM

## 2018-12-22 MED ORDER — TRUE METRIX BLOOD GLUCOSE TEST VI STRP: 1.0000 | ORAL_STRIP | Freq: Two times a day (BID) | 2 refills | Status: DC

## 2018-12-22 NOTE — Telephone Encounter (Signed)
glucose blood (TRUE METRIX BLOOD GLUCOSE TEST) test strip 200 strip 2 12/22/2018    Sig - Route: 1 each by Other route 2 (two) times daily. use for testing - Other   Sent to pharmacy as: glucose blood (TRUE METRIX BLOOD GLUCOSE TEST) test strip   Notes to Pharmacy: **Patient requests 90 days supply**   E-Prescribing Status: Receipt confirmed by pharmacy (12/22/2018 2:49 PM EST)

## 2018-12-22 NOTE — Telephone Encounter (Signed)
MEDICATION: True Metrix Blood Glucose test strips  PHARMACY:  [facility] [address]  IS THIS A 90 DAY SUPPLY :   IS PATIENT OUT OF MEDICATION: yes  IF NOT; HOW MUCH IS LEFT:   LAST APPOINTMENT DATE: @11 /13/2020  NEXT APPOINTMENT DATE:@12 /15/2020  DO WE HAVE YOUR PERMISSION TO LEAVE A DETAILED MESSAGE: yes  OTHER COMMENTS:    **Let patient know to contact pharmacy at the end of the day to make sure medication is ready. **  ** Please notify patient to allow 48-72 hours to process**  **Encourage patient to contact the pharmacy for refills or they can request refills through [facility]**

## 2018-12-23 ENCOUNTER — Telehealth: Payer: Self-pay | Admitting: Internal Medicine

## 2018-12-23 NOTE — Telephone Encounter (Signed)
Returned patient's phone call regarding scheduling an appointment, left a voicemail. 

## 2018-12-30 DIAGNOSIS — F419 Anxiety disorder, unspecified: Secondary | ICD-10-CM | POA: Diagnosis not present

## 2018-12-30 DIAGNOSIS — F331 Major depressive disorder, recurrent, moderate: Secondary | ICD-10-CM | POA: Diagnosis not present

## 2019-01-01 ENCOUNTER — Encounter

## 2019-01-01 ENCOUNTER — Ambulatory Visit: Payer: Medicare HMO | Admitting: Dietician

## 2019-01-07 ENCOUNTER — Telehealth: Payer: Self-pay

## 2019-01-07 NOTE — Telephone Encounter (Signed)
This medication is not listed on her current medication regimen. This is a conversation she would need to have with her pharmacy.

## 2019-01-07 NOTE — Telephone Encounter (Signed)
Patient called in wanting to let you guys know to stop the refills on trulicity.

## 2019-01-08 ENCOUNTER — Other Ambulatory Visit: Payer: Self-pay

## 2019-01-12 ENCOUNTER — Ambulatory Visit (INDEPENDENT_AMBULATORY_CARE_PROVIDER_SITE_OTHER): Payer: Medicare HMO | Admitting: Endocrinology

## 2019-01-12 ENCOUNTER — Encounter: Payer: Self-pay | Admitting: Endocrinology

## 2019-01-12 ENCOUNTER — Other Ambulatory Visit: Payer: Self-pay

## 2019-01-12 VITALS — BP 102/42 | HR 68 | Ht 64.0 in | Wt 170.4 lb

## 2019-01-12 DIAGNOSIS — N1831 Chronic kidney disease, stage 3a: Secondary | ICD-10-CM | POA: Diagnosis not present

## 2019-01-12 DIAGNOSIS — N183 Chronic kidney disease, stage 3 unspecified: Secondary | ICD-10-CM

## 2019-01-12 DIAGNOSIS — E1122 Type 2 diabetes mellitus with diabetic chronic kidney disease: Secondary | ICD-10-CM | POA: Diagnosis not present

## 2019-01-12 DIAGNOSIS — Z794 Long term (current) use of insulin: Secondary | ICD-10-CM

## 2019-01-12 DIAGNOSIS — E1121 Type 2 diabetes mellitus with diabetic nephropathy: Secondary | ICD-10-CM

## 2019-01-12 LAB — POCT GLYCOSYLATED HEMOGLOBIN (HGB A1C): Hemoglobin A1C: 7.1 % — AB (ref 4.0–5.6)

## 2019-01-12 MED ORDER — INSULIN DETEMIR 100 UNIT/ML FLEXPEN: 140.0000 [IU] | PEN_INJECTOR | SUBCUTANEOUS | 3 refills | Status: DC

## 2019-01-12 MED ORDER — LIRAGLUTIDE 18 MG/3ML ~~LOC~~ SOPN: 1.8000 mg | PEN_INJECTOR | Freq: Every day | SUBCUTANEOUS | 11 refills | Status: DC

## 2019-01-12 NOTE — Progress Notes (Signed)
Subjective:    Patient ID: Courtney Goodman, female    DOB: 04-02-1945, 73 y.o.   MRN: [id number]  HPI Pt returns for f/u of diabetes mellitus: DM type: Insulin-requiring type 2 Dx'ed: 0240 Complications: renal insufficiency and CAD.  Therapy: insulin since 2009, and victoza. GDM: never DKA: never Severe hypoglycemia: 2012 and 2019.   Pancreatitis: never.  Other: she is on qd insulin, due to h/o noncompliance; ins declined tresiba.   Interval history: she brings a record of her cbg's which I have reviewed today.  She takes fasting only.  cbg varies 56-289.   Past Medical History:  Diagnosis Date  . Anemia of chronic disease   . Asthma   . Atherosclerotic heart disease of native coronary artery without angina pectoris   . CKD (chronic kidney disease)   . COPD (chronic obstructive pulmonary disease) ([place])   . DM (diabetes mellitus), type 2 with renal complications ([place])   . Dysrhythmia   . Elevated ferritin 01/03/2017  . GAD (generalized anxiety disorder)   . Hypertension   . Mixed hyperlipidemia due to type 2 diabetes mellitus ([place])   . Mixed incontinence    per medical records from [place]  . Osteopenia   . Personal history of noncompliance with medical treatment, presenting hazards to health   . Shortness of breath dyspnea   . Suicidal ideation   . TB (tuberculosis), treated    age 89  . Vitamin D deficiency     Past Surgical History:  Procedure Laterality Date  . ANKLE RECONSTRUCTION     right  . CARPAL TUNNEL RELEASE    . CORONARY ANGIOPLASTY      Social History   Socioeconomic History  . Marital status: Divorced    Spouse name: Not on file  . Number of children: 2  . Years of education: 82  . Highest education level: High school graduate  Occupational History  . Not on file  Tobacco Use  . Smoking status: Former Research scientist (life sciences)  . Smokeless tobacco: Never Used  Substance and Sexual Activity  . Alcohol use: No  . Drug use: No  . Sexual activity: Never  Other  Topics Concern  . Not on file  Social History Narrative  . Not on file   Social Determinants of Health   Financial Resource Strain: Medium Risk  . Difficulty of Paying Living Expenses: Somewhat hard  Food Insecurity: Food Insecurity Present  . Worried About Charity fundraiser in the Last Year: Sometimes true  . Ran Out of Food in the Last Year: Sometimes true  Transportation Needs: No Transportation Needs  . Lack of Transportation (Medical): No  . Lack of Transportation (Non-Medical): No  Physical Activity: Inactive  . Days of Exercise per Week: 0 days  . Minutes of Exercise per Session: 0 min  Stress: Stress Concern Present  . Feeling of Stress : Very much  Social Connections: Somewhat Isolated  . Frequency of Communication with Friends and Family: More than three times a week  . Frequency of Social Gatherings with Friends and Family: More than three times a week  . Attends Religious Services: More than 4 times per year  . Active Member of Clubs or Organizations: No  . Attends Archivist Meetings: Never  . Marital Status: Divorced  Human resources officer Violence: Not At Risk  . Fear of Current or Ex-Partner: No  . Emotionally Abused: No  . Physically Abused: No  . Sexually Abused: No    Current  Outpatient Medications on File Prior to Visit  Medication Sig Dispense Refill  . albuterol (PROVENTIL HFA;VENTOLIN HFA) 108 (90 Base) MCG/ACT inhaler Inhale 2 puffs into the lungs every 6 (six) hours as needed for wheezing or shortness of breath. 1 Inhaler 0  . Alcohol Swabs (ALCOHOL PREP) PADS Use for testing 100 each 1  . amLODipine-benazepril (LOTREL) 5-20 MG capsule Take 1 capsule by mouth daily. 90 capsule 0  . aspirin EC 81 MG tablet Take 81 mg by mouth daily.     . Biotin 10000 MCG TABS Take 1 tablet by mouth daily.     . Blood Glucose Monitoring Suppl (TRUE METRIX METER) w/Device KIT 1 each by Does not apply route 2 (two) times daily. Use to monitor glucose levels BID;  E11.29 1 kit 0  . carvedilol (COREG) 6.25 MG tablet Take 1 tablet by mouth twice daily. 180 tablet 0  . cholecalciferol (VITAMIN D) 25 MCG (1000 UT) tablet Take 1 tablet by mouth daily. 90 tablet 0  . citalopram (CELEXA) 20 MG tablet Take 1 tablet (20 mg total) by mouth daily. 90 tablet 1  . fenofibrate (TRICOR) 145 MG tablet Take 1 tablet by mouth daily. 90 tablet 0  . fluticasone (FLONASE) 50 MCG/ACT nasal spray Place 2 sprays into both nostrils daily. 48 g 0  . folic acid (FOLVITE) 1 MG tablet Take 1 tablet (1 mg total) by mouth daily. 90 tablet 0  . glucose blood (TRUE METRIX BLOOD GLUCOSE TEST) test strip 1 each by Other route 2 (two) times daily. use for testing 200 strip 2  . Insulin Pen Needle 31G X 5 MM MISC Inject 1 each into the skin 2 (two) times daily. Use to inject insulin twice daily; E11.29 100 each 1  . omeprazole (PRILOSEC) 40 MG capsule Take 1 capsule (40 mg total) by mouth daily. 90 capsule 1  . simvastatin (ZOCOR) 20 MG tablet Take 1 tablet by mouth every night. 90 tablet 0  . traZODone (DESYREL) 50 MG tablet Take 50 mg by mouth at bedtime.      No current facility-administered medications on file prior to visit.    No Known Allergies  Family History  Problem Relation Age of Onset  . Diabetes Mother     BP (!) 102/42 (BP Location: Right Arm, Patient Position: Sitting, Cuff Size: Normal)   Pulse 68   Ht 5' 4"  (1.626 m)   Wt 170 lb 6.4 oz (77.3 kg)   SpO2 95%   BMI 29.25 kg/m   Review of Systems Denies LOC.    Objective:   Physical Exam VITAL SIGNS:  See vs page GENERAL: no distress Pulses: dorsalis pedis intact bilat.   MSK: no deformity of the feet CV: trace right leg edema (none on the left) Skin:  no ulcer on the feet.  normal color and temp on the feet. Neuro: sensation is intact to touch on the feet  Lab Results  Component Value Date   HGBA1C 7.1 (A) 01/12/2019   Lab Results  Component Value Date   CREATININE 2.21 (H) 10/03/2018   BUN 41 (H)  10/03/2018   NA 132 (L) 10/03/2018   K 4.1 10/03/2018   CL 97 (L) 10/03/2018   CO2 21 (L) 10/03/2018      Assessment & Plan:  Insulin-requiring type 2 DM, with CAD Renal insuff: in this setting, she needs an intermed-acting qam insulin Hypoglycemia: this limits aggressiveness of glycemic control.    Patient Instructions  Please reduce the  Levemir to 140 units each morning, and:  Please continue the same Victoza   check your blood sugar twice a day.  vary the time of day when you check, between before the 3 meals, and at bedtime.  also check if you have symptoms of your blood sugar being too high or too low.  please keep a record of the readings and bring it to your next appointment here (or you can bring the meter itself).  You can write it on any piece of paper.  please call us sooner if your blood sugar goes below 70, or if you have a lot of readings over 200. check your blood sugar twice a day.  vary the time of day when you check, between before the 3 meals, and at bedtime.  also check if you have symptoms of your blood sugar being too high or too low.  please keep a record of the readings and bring it to your next appointment here (or you can bring the meter itself).  You can write it on any piece of paper.  please call us sooner if your blood sugar goes below 70, or if you have a lot of readings over 200.  Please come back for a follow-up appointment in 2 months.

## 2019-01-12 NOTE — Patient Instructions (Addendum)
Please reduce the Levemir to 140 units each morning, and:  Please continue the same Victoza   check your blood sugar twice a day.  vary the time of day when you check, between before the 3 meals, and at bedtime.  also check if you have symptoms of your blood sugar being too high or too low.  please keep a record of the readings and bring it to your next appointment here (or you can bring the meter itself).  You can write it on any piece of paper.  please call us sooner if your blood sugar goes below 70, or if you have a lot of readings over 200. check your blood sugar twice a day.  vary the time of day when you check, between before the 3 meals, and at bedtime.  also check if you have symptoms of your blood sugar being too high or too low.  please keep a record of the readings and bring it to your next appointment here (or you can bring the meter itself).  You can write it on any piece of paper.  please call us sooner if your blood sugar goes below 70, or if you have a lot of readings over 200.  Please come back for a follow-up appointment in 2 months.

## 2019-01-14 ENCOUNTER — Other Ambulatory Visit: Payer: Self-pay

## 2019-01-14 NOTE — Patient Outreach (Signed)
[facility]) Care Management  01/14/2019  Courtney Goodman 01-Jul-1945 [id number]   Telephone call to daughter [name] who is caregiver and contact for patient. No answer.  Unable to leave a message.    Plan: RN CM will attempt again in the month of January and send letter.  Jone Baseman, RN, MSN [facility] Care Management Coordinator Direct Line [phone] Cell [phone] Toll Free: [phone]  Fax: [phone]

## 2019-01-18 ENCOUNTER — Telehealth: Payer: Self-pay | Admitting: Internal Medicine

## 2019-01-18 NOTE — Telephone Encounter (Signed)
Scheduled appt per 12/17 sch message - pt daughter aware of new appt date and time

## 2019-01-20 ENCOUNTER — Other Ambulatory Visit: Payer: Self-pay | Admitting: Family Medicine

## 2019-02-02 ENCOUNTER — Ambulatory Visit: Payer: Medicare HMO | Admitting: Dietician

## 2019-02-04 ENCOUNTER — Inpatient Hospital Stay (HOSPITAL_BASED_OUTPATIENT_CLINIC_OR_DEPARTMENT_OTHER): Payer: Medicare HMO | Admitting: Internal Medicine

## 2019-02-04 ENCOUNTER — Inpatient Hospital Stay: Payer: Medicare HMO

## 2019-02-04 ENCOUNTER — Other Ambulatory Visit: Payer: Self-pay

## 2019-02-04 ENCOUNTER — Inpatient Hospital Stay: Payer: Medicare HMO | Attending: Oncology

## 2019-02-04 ENCOUNTER — Encounter: Payer: Self-pay | Admitting: Internal Medicine

## 2019-02-04 VITALS — BP 118/66 | HR 67 | Temp 97.2°F | Resp 17 | Ht 64.0 in | Wt 168.9 lb

## 2019-02-04 DIAGNOSIS — Z794 Long term (current) use of insulin: Secondary | ICD-10-CM | POA: Insufficient documentation

## 2019-02-04 DIAGNOSIS — M8580 Other specified disorders of bone density and structure, unspecified site: Secondary | ICD-10-CM | POA: Insufficient documentation

## 2019-02-04 DIAGNOSIS — J449 Chronic obstructive pulmonary disease, unspecified: Secondary | ICD-10-CM | POA: Insufficient documentation

## 2019-02-04 DIAGNOSIS — E1140 Type 2 diabetes mellitus with diabetic neuropathy, unspecified: Secondary | ICD-10-CM | POA: Insufficient documentation

## 2019-02-04 DIAGNOSIS — I129 Hypertensive chronic kidney disease with stage 1 through stage 4 chronic kidney disease, or unspecified chronic kidney disease: Secondary | ICD-10-CM | POA: Insufficient documentation

## 2019-02-04 DIAGNOSIS — F419 Anxiety disorder, unspecified: Secondary | ICD-10-CM | POA: Diagnosis not present

## 2019-02-04 DIAGNOSIS — E785 Hyperlipidemia, unspecified: Secondary | ICD-10-CM | POA: Insufficient documentation

## 2019-02-04 DIAGNOSIS — D631 Anemia in chronic kidney disease: Secondary | ICD-10-CM

## 2019-02-04 DIAGNOSIS — N189 Chronic kidney disease, unspecified: Secondary | ICD-10-CM | POA: Diagnosis not present

## 2019-02-04 DIAGNOSIS — Z7982 Long term (current) use of aspirin: Secondary | ICD-10-CM | POA: Diagnosis not present

## 2019-02-04 DIAGNOSIS — Z79899 Other long term (current) drug therapy: Secondary | ICD-10-CM | POA: Insufficient documentation

## 2019-02-04 DIAGNOSIS — D638 Anemia in other chronic diseases classified elsewhere: Secondary | ICD-10-CM

## 2019-02-04 DIAGNOSIS — I10 Essential (primary) hypertension: Secondary | ICD-10-CM

## 2019-02-04 DIAGNOSIS — N183 Chronic kidney disease, stage 3 unspecified: Secondary | ICD-10-CM

## 2019-02-04 DIAGNOSIS — I2510 Atherosclerotic heart disease of native coronary artery without angina pectoris: Secondary | ICD-10-CM | POA: Diagnosis not present

## 2019-02-04 DIAGNOSIS — E559 Vitamin D deficiency, unspecified: Secondary | ICD-10-CM | POA: Insufficient documentation

## 2019-02-04 LAB — CBC WITH DIFFERENTIAL (CANCER CENTER ONLY)
Abs Immature Granulocytes: 0.03 10*3/uL (ref 0.00–0.07)
Basophils Absolute: 0.1 10*3/uL (ref 0.0–0.1)
Basophils Relative: 1 %
Eosinophils Absolute: 0.2 10*3/uL (ref 0.0–0.5)
Eosinophils Relative: 3 %
HCT: 33.1 % — ABNORMAL LOW (ref 36.0–46.0)
Hemoglobin: 10.6 g/dL — ABNORMAL LOW (ref 12.0–15.0)
Immature Granulocytes: 0 %
Lymphocytes Relative: 24 %
Lymphs Abs: 1.6 10*3/uL (ref 0.7–4.0)
MCH: 28.9 pg (ref 26.0–34.0)
MCHC: 32 g/dL (ref 30.0–36.0)
MCV: 90.2 fL (ref 80.0–100.0)
Monocytes Absolute: 0.5 10*3/uL (ref 0.1–1.0)
Monocytes Relative: 8 %
Neutro Abs: 4.2 10*3/uL (ref 1.7–7.7)
Neutrophils Relative %: 64 %
Platelet Count: 308 10*3/uL (ref 150–400)
RBC: 3.67 MIL/uL — ABNORMAL LOW (ref 3.87–5.11)
RDW: 16.2 % — ABNORMAL HIGH (ref 11.5–15.5)
WBC Count: 6.7 10*3/uL (ref 4.0–10.5)
nRBC: 0 % (ref 0.0–0.2)

## 2019-02-04 MED ORDER — EPOETIN ALFA-EPBX 40000 UNIT/ML IJ SOLN: 40000.0000 [IU] | Freq: Once | INTRAMUSCULAR | Status: DC

## 2019-02-04 NOTE — Progress Notes (Signed)
Attempted to locate pt several times for injection appt after there was delay in arriving her from MD appt.  Called cell phone, spoke with daughter who states that she will contact patient regarding her location and that if the patient needs to reschedule her injection for today then she would prefer for it to be on Monday at around 68 am.  Alerted MD [facility] who gave VO for pt to not receive injection at this time and to wait until her next appt as scheduled d/t her lab values drawn today.  Called daughter [name] back who states she will let patient know she can skip the injection this time and that there is no need for the patient to return to the [facility] until her next scheduled appt in February.

## 2019-02-04 NOTE — Patient Instructions (Signed)

## 2019-02-04 NOTE — Progress Notes (Signed)
[facility] [phone]   [phone]  OFFICE PROGRESS NOTE  [name], NP-C [place] [address]  DIAGNOSIS: Anemia of chronic disease secondary to chronic renal insufficiency  PRIOR THERAPY: None  CURRENT THERAPY: Retacrit 300 mcg subcutaneously every 3 weeks in addition to over-the-counter oral iron tablet 1 tablet every day.  INTERVAL HISTORY: Courtney Goodman 74 y.o. female returns to the clinic today for follow-up visit.  The patient is feeling fine today with no concerning complaints.  She denied having any fatigue or weakness.  The patient denied having any chest pain, shortness of breath, cough or hemoptysis.  She denied having any fever or chills.  She has no nausea, vomiting, diarrhea or constipation.  She has not been taking her oral iron tablets as prescribed.  She continues to receive Retacrit injection every 3 weeks.  She is here today for evaluation and repeat blood work.   MEDICAL HISTORY: Past Medical History:  Diagnosis Date  . Anemia of chronic disease   . Asthma   . Atherosclerotic heart disease of native coronary artery without angina pectoris   . CKD (chronic kidney disease)   . COPD (chronic obstructive pulmonary disease) ([place])   . DM (diabetes mellitus), type 2 with renal complications ([place])   . Dysrhythmia   . Elevated ferritin 01/03/2017  . GAD (generalized anxiety disorder)   . Hypertension   . Mixed hyperlipidemia due to type 2 diabetes mellitus ([place])   . Mixed incontinence    per medical records from [place]  . Osteopenia   . Personal history of noncompliance with medical treatment, presenting hazards to health   . Shortness of breath dyspnea   . Suicidal ideation   . TB (tuberculosis), treated    age 15  . Vitamin D deficiency     ALLERGIES:  has No Known Allergies.  MEDICATIONS:  Current Outpatient Medications  Medication Sig Dispense Refill  . albuterol (PROVENTIL  HFA;VENTOLIN HFA) 108 (90 Base) MCG/ACT inhaler Inhale 2 puffs into the lungs every 6 (six) hours as needed for wheezing or shortness of breath. 1 Inhaler 0  . Alcohol Swabs (ALCOHOL PREP) PADS Use for testing 100 each 1  . amLODipine-benazepril (LOTREL) 5-20 MG capsule Take 1 capsule by mouth daily. 90 capsule 0  . aspirin EC 81 MG tablet Take 81 mg by mouth daily.     . Biotin 10000 MCG TABS Take 1 tablet by mouth daily.     . Blood Glucose Monitoring Suppl (TRUE METRIX METER) w/Device KIT 1 each by Does not apply route 2 (two) times daily. Use to monitor glucose levels BID; E11.29 1 kit 0  . carvedilol (COREG) 6.25 MG tablet Take 1 tablet by mouth twice daily. 180 tablet 0  . Cholecalciferol 25 MCG (1000 UT) tablet Take 1 tablet by mouth daily. 90 tablet 0  . citalopram (CELEXA) 20 MG tablet Take 1 tablet (20 mg total) by mouth daily. 90 tablet 1  . fenofibrate (TRICOR) 145 MG tablet Take 1 tablet by mouth daily. 90 tablet 0  . fluticasone (FLONASE) 50 MCG/ACT nasal spray Place 2 sprays into both nostrils daily. 48 g 0  . folic acid (FOLVITE) 1 MG tablet Take 1 tablet (1 mg total) by mouth daily. 90 tablet 0  . glucose blood (TRUE METRIX BLOOD GLUCOSE TEST) test strip 1 each by Other route 2 (two) times daily. use for testing 200 strip 2  . Insulin Detemir (LEVEMIR)  100 UNIT/ML Pen Inject 140 Units into the skin every morning. 20 pen 3  . Insulin Pen Needle 31G X 5 MM MISC Inject 1 each into the skin 2 (two) times daily. Use to inject insulin twice daily; E11.29 100 each 1  . liraglutide (VICTOZA) 18 MG/3ML SOPN Inject 0.3 mLs (1.8 mg total) into the skin daily. 3 mL 11  . omeprazole (PRILOSEC) 40 MG capsule Take 1 capsule (40 mg total) by mouth daily. 90 capsule 1  . simvastatin (ZOCOR) 20 MG tablet Take 1 tablet by mouth every night. 90 tablet 0  . traZODone (DESYREL) 50 MG tablet Take 50 mg by mouth at bedtime.      No current facility-administered medications for this visit.    SURGICAL  HISTORY:  Past Surgical History:  Procedure Laterality Date  . ANKLE RECONSTRUCTION     right  . CARPAL TUNNEL RELEASE    . CORONARY ANGIOPLASTY      REVIEW OF SYSTEMS:  A comprehensive review of systems was negative.   PHYSICAL EXAMINATION: General appearance: alert, cooperative and no distress Head: Normocephalic, without obvious abnormality, atraumatic Neck: no adenopathy, no JVD, supple, symmetrical, trachea midline and thyroid not enlarged, symmetric, no tenderness/mass/nodules Lymph nodes: Cervical, supraclavicular, and axillary nodes normal. Resp: clear to auscultation bilaterally Back: symmetric, no curvature. ROM normal. No CVA tenderness. Cardio: regular rate and rhythm, S1, S2 normal, no murmur, click, rub or gallop GI: soft, non-tender; bowel sounds normal; no masses,  no organomegaly Extremities: extremities normal, atraumatic, no cyanosis or edema  ECOG PERFORMANCE STATUS: 1 - Symptomatic but completely ambulatory  Blood pressure 118/66, pulse 67, temperature (!) 97.2 F (36.2 C), temperature source Temporal, resp. rate 17, height _0  (1.626 m), weight 168 lb 14.4 oz (76.6 kg), SpO2 100 %.  LABORATORY DATA: Lab Results  Component Value Date   WBC 7.9 12/15/2018   HGB 10.9 (L) 12/15/2018   HCT 34.0 (L) 12/15/2018   MCV 92.6 12/15/2018   PLT 326 12/15/2018      Chemistry      Component Value Date/Time   NA 132 (L) 10/03/2018 2313   NA 146 (H) 05/07/2017 1147   NA 137 05/29/2011 1946   K 4.1 10/03/2018 2313   K 3.9 05/29/2011 1946   CL 97 (L) 10/03/2018 2251   CL 100 05/29/2011 1946   CO2 21 (L) 10/03/2018 2251   CO2 28 05/29/2011 1946   BUN 41 (H) 10/03/2018 2251   BUN 28 (H) 05/07/2017 1147   BUN 24 (H) 05/29/2011 1946   CREATININE 2.21 (H) 10/03/2018 2251   CREATININE 1.52 (H) 04/08/2018 1154   CREATININE 1.56 (H) 01/01/2017 0941      Component Value Date/Time   CALCIUM 9.7 10/03/2018 2251   CALCIUM 9.8 05/29/2011 1946   ALKPHOS 55 09/22/2018  1945   ALKPHOS 86 05/29/2011 1946   AST 17 09/22/2018 1945   AST 14 (L) 04/08/2018 1154   ALT 13 09/22/2018 1945   ALT 13 04/08/2018 1154   ALT 15 05/29/2011 1946   BILITOT 1.0 09/22/2018 1945   BILITOT 0.4 04/08/2018 1154       RADIOGRAPHIC STUDIES: No results found.  ASSESSMENT AND PLAN: This is a very pleasant 74 years old African-American female with anemia of chronic disease secondary to chronic renal insufficiency.  The patient is currently undergoing treatment with Retacrit injections 300 mcg subcutaneously every 3 weeks. The patient has been tolerating this treatment well with no concerning adverse effects. I recommended for  her to continue her injection as prescribed. I also recommend for the patient to resume her treatment with the oral iron tablets 1-2 tablets every day with vitamin C or orange juice. I will see her back for follow-up visit in 3 months for evaluation with repeat blood work. The patient was advised to call immediately if she has any concerning symptoms in the interval. The patient voices understanding of current disease status and treatment options and is in agreement with the current care plan. All questions were answered. The patient knows to call the clinic with any problems, questions or concerns. We can certainly see the patient much sooner if necessary.  I spent 10 minutes counseling the patient face to face. The total time spent in the appointment was 15 minutes.  Disclaimer: This note was dictated with voice recognition software. Similar sounding words can inadvertently be transcribed and may not be corrected upon review.

## 2019-02-05 ENCOUNTER — Telehealth: Payer: Self-pay | Admitting: Internal Medicine

## 2019-02-05 NOTE — Telephone Encounter (Signed)
Scheduled per [facility]. Called, not available. Mailed printout  

## 2019-02-16 ENCOUNTER — Telehealth: Payer: Self-pay | Admitting: Medical Oncology

## 2019-02-16 NOTE — Telephone Encounter (Signed)
Injection appt confirmed.

## 2019-02-19 ENCOUNTER — Other Ambulatory Visit: Payer: Self-pay

## 2019-02-19 ENCOUNTER — Other Ambulatory Visit: Payer: Self-pay | Admitting: Family Medicine

## 2019-02-19 NOTE — Patient Outreach (Signed)
Courtney Goodman [facility]) Care Management  02/19/2019  Courtney Goodman [place] [birth year] [id number]   Telephone call to daughter [name] for disease management check in. No answer. Unable to leave a message.    Plan: RN CM will attempt patient again in the month of March.   Jone Baseman, RN, MSN [place] Management Care Management Coordinator Direct Line [phone] Cell [phone] Toll Free: [phone]  Fax: [phone]

## 2019-02-22 ENCOUNTER — Ambulatory Visit: Payer: Medicare HMO | Admitting: Podiatry

## 2019-02-22 ENCOUNTER — Other Ambulatory Visit: Payer: Self-pay | Admitting: Endocrinology

## 2019-02-22 DIAGNOSIS — D649 Anemia, unspecified: Secondary | ICD-10-CM | POA: Diagnosis not present

## 2019-02-22 DIAGNOSIS — N183 Chronic kidney disease, stage 3 unspecified: Secondary | ICD-10-CM | POA: Diagnosis not present

## 2019-02-22 DIAGNOSIS — I129 Hypertensive chronic kidney disease with stage 1 through stage 4 chronic kidney disease, or unspecified chronic kidney disease: Secondary | ICD-10-CM | POA: Diagnosis not present

## 2019-02-22 DIAGNOSIS — E1122 Type 2 diabetes mellitus with diabetic chronic kidney disease: Secondary | ICD-10-CM | POA: Diagnosis not present

## 2019-02-22 DIAGNOSIS — K219 Gastro-esophageal reflux disease without esophagitis: Secondary | ICD-10-CM | POA: Diagnosis not present

## 2019-02-22 DIAGNOSIS — N390 Urinary tract infection, site not specified: Secondary | ICD-10-CM | POA: Diagnosis not present

## 2019-02-22 DIAGNOSIS — Z794 Long term (current) use of insulin: Secondary | ICD-10-CM

## 2019-02-22 DIAGNOSIS — R413 Other amnesia: Secondary | ICD-10-CM | POA: Diagnosis not present

## 2019-02-23 ENCOUNTER — Ambulatory Visit: Payer: Medicare HMO | Admitting: Registered"

## 2019-02-25 ENCOUNTER — Other Ambulatory Visit: Payer: Self-pay

## 2019-02-25 ENCOUNTER — Inpatient Hospital Stay: Payer: Medicare HMO

## 2019-02-25 ENCOUNTER — Other Ambulatory Visit: Payer: Self-pay | Admitting: Medical Oncology

## 2019-02-25 VITALS — BP 141/86 | HR 76 | Temp 98.9°F | Resp 20

## 2019-02-25 DIAGNOSIS — D631 Anemia in chronic kidney disease: Secondary | ICD-10-CM | POA: Diagnosis not present

## 2019-02-25 DIAGNOSIS — J449 Chronic obstructive pulmonary disease, unspecified: Secondary | ICD-10-CM | POA: Diagnosis not present

## 2019-02-25 DIAGNOSIS — N183 Chronic kidney disease, stage 3 unspecified: Secondary | ICD-10-CM

## 2019-02-25 DIAGNOSIS — I129 Hypertensive chronic kidney disease with stage 1 through stage 4 chronic kidney disease, or unspecified chronic kidney disease: Secondary | ICD-10-CM | POA: Diagnosis not present

## 2019-02-25 DIAGNOSIS — Z79899 Other long term (current) drug therapy: Secondary | ICD-10-CM | POA: Diagnosis not present

## 2019-02-25 DIAGNOSIS — N189 Chronic kidney disease, unspecified: Secondary | ICD-10-CM | POA: Diagnosis not present

## 2019-02-25 DIAGNOSIS — E1140 Type 2 diabetes mellitus with diabetic neuropathy, unspecified: Secondary | ICD-10-CM | POA: Diagnosis not present

## 2019-02-25 DIAGNOSIS — D638 Anemia in other chronic diseases classified elsewhere: Secondary | ICD-10-CM

## 2019-02-25 DIAGNOSIS — F419 Anxiety disorder, unspecified: Secondary | ICD-10-CM | POA: Diagnosis not present

## 2019-02-25 DIAGNOSIS — I2510 Atherosclerotic heart disease of native coronary artery without angina pectoris: Secondary | ICD-10-CM | POA: Diagnosis not present

## 2019-02-25 DIAGNOSIS — E785 Hyperlipidemia, unspecified: Secondary | ICD-10-CM | POA: Diagnosis not present

## 2019-02-25 LAB — CBC WITH DIFFERENTIAL (CANCER CENTER ONLY)
Abs Immature Granulocytes: 0.03 10*3/uL (ref 0.00–0.07)
Basophils Absolute: 0 10*3/uL (ref 0.0–0.1)
Basophils Relative: 1 %
Eosinophils Absolute: 0.2 10*3/uL (ref 0.0–0.5)
Eosinophils Relative: 2 %
HCT: 29.3 % — ABNORMAL LOW (ref 36.0–46.0)
Hemoglobin: 9.3 g/dL — ABNORMAL LOW (ref 12.0–15.0)
Immature Granulocytes: 0 %
Lymphocytes Relative: 24 %
Lymphs Abs: 2 10*3/uL (ref 0.7–4.0)
MCH: 28.7 pg (ref 26.0–34.0)
MCHC: 31.7 g/dL (ref 30.0–36.0)
MCV: 90.4 fL (ref 80.0–100.0)
Monocytes Absolute: 0.6 10*3/uL (ref 0.1–1.0)
Monocytes Relative: 7 %
Neutro Abs: 5.5 10*3/uL (ref 1.7–7.7)
Neutrophils Relative %: 66 %
Platelet Count: 321 10*3/uL (ref 150–400)
RBC: 3.24 MIL/uL — ABNORMAL LOW (ref 3.87–5.11)
RDW: 16.8 % — ABNORMAL HIGH (ref 11.5–15.5)
WBC Count: 8.3 10*3/uL (ref 4.0–10.5)
nRBC: 0 % (ref 0.0–0.2)

## 2019-02-25 MED ORDER — EPOETIN ALFA-EPBX 40000 UNIT/ML IJ SOLN
INTRAMUSCULAR | Status: AC
  Filled 2019-02-25: qty 1

## 2019-02-25 MED ORDER — EPOETIN ALFA-EPBX 40000 UNIT/ML IJ SOLN
40000.0000 [IU] | Freq: Once | INTRAMUSCULAR | Status: AC
  Administered 2019-02-25: 40000 [IU] via SUBCUTANEOUS

## 2019-02-25 NOTE — Patient Instructions (Signed)

## 2019-03-02 ENCOUNTER — Ambulatory Visit: Payer: Medicare HMO | Admitting: Skilled Nursing Facility1

## 2019-03-05 ENCOUNTER — Other Ambulatory Visit: Payer: Self-pay

## 2019-03-05 ENCOUNTER — Telehealth: Payer: Self-pay

## 2019-03-05 DIAGNOSIS — E1122 Type 2 diabetes mellitus with diabetic chronic kidney disease: Secondary | ICD-10-CM

## 2019-03-05 DIAGNOSIS — N183 Chronic kidney disease, stage 3 unspecified: Secondary | ICD-10-CM

## 2019-03-05 DIAGNOSIS — Z794 Long term (current) use of insulin: Secondary | ICD-10-CM

## 2019-03-05 MED ORDER — INSULIN PEN NEEDLE 31G X 5 MM MISC: 1.0000 | Freq: Two times a day (BID) | 1 refills | Status: DC

## 2019-03-05 NOTE — Telephone Encounter (Signed)
Outpatient Medication Detail   Disp Refills Start End   Insulin Pen Needle 31G X 5 MM MISC 100 each 1 03/05/2019    Sig - Route: Inject 1 each into the skin 2 (two) times daily. Use to inject insulin twice daily; E11.29 - Subcutaneous   Sent to pharmacy as: Insulin Pen Needle 31G X 5 MM Misc   E-Prescribing Status: Receipt confirmed by pharmacy (03/05/2019  1:57 PM EST)

## 2019-03-05 NOTE — Telephone Encounter (Signed)
Pharmacy is wanting rx for pin needles sent in to [facility].  31 g 3/16 inch 50mm confort easy.

## 2019-03-15 ENCOUNTER — Telehealth: Payer: Self-pay | Admitting: Internal Medicine

## 2019-03-15 NOTE — Telephone Encounter (Signed)
Scheduled per 2/12 sch msg. Called and spoke with pt, confirmed 2/18 appt

## 2019-03-18 ENCOUNTER — Telehealth: Payer: Self-pay | Admitting: Emergency Medicine

## 2019-03-18 ENCOUNTER — Other Ambulatory Visit: Payer: Medicare HMO

## 2019-03-18 ENCOUNTER — Inpatient Hospital Stay: Payer: Medicare HMO

## 2019-03-18 NOTE — Telephone Encounter (Signed)
Called pt regarding lab/injection appt scheduled for this am  (2/18) to get them rescheduled for 1330 for lab and 1400 for injection tomorrow (2/19).  Pt agreed to appt time/date change, states she will call transportation for herself and call back to confirm appt.  Scheduling message sent for lab appt creation, injection appt changed to 1400.

## 2019-03-19 ENCOUNTER — Ambulatory Visit: Payer: Medicare HMO

## 2019-03-19 ENCOUNTER — Other Ambulatory Visit: Payer: Medicare HMO

## 2019-03-21 ENCOUNTER — Other Ambulatory Visit: Payer: Self-pay | Admitting: Family Medicine

## 2019-03-22 ENCOUNTER — Ambulatory Visit (INDEPENDENT_AMBULATORY_CARE_PROVIDER_SITE_OTHER): Payer: Medicare HMO | Admitting: Endocrinology

## 2019-03-22 ENCOUNTER — Encounter: Payer: Self-pay | Admitting: Endocrinology

## 2019-03-22 ENCOUNTER — Other Ambulatory Visit: Payer: Self-pay

## 2019-03-22 VITALS — BP 102/60 | HR 73 | Ht 64.0 in | Wt 170.4 lb

## 2019-03-22 DIAGNOSIS — N183 Chronic kidney disease, stage 3 unspecified: Secondary | ICD-10-CM

## 2019-03-22 DIAGNOSIS — E1121 Type 2 diabetes mellitus with diabetic nephropathy: Secondary | ICD-10-CM

## 2019-03-22 DIAGNOSIS — Z794 Long term (current) use of insulin: Secondary | ICD-10-CM

## 2019-03-22 DIAGNOSIS — N1831 Chronic kidney disease, stage 3a: Secondary | ICD-10-CM

## 2019-03-22 DIAGNOSIS — E1122 Type 2 diabetes mellitus with diabetic chronic kidney disease: Secondary | ICD-10-CM

## 2019-03-22 LAB — POCT GLYCOSYLATED HEMOGLOBIN (HGB A1C): Hemoglobin A1C: 7.6 % — AB (ref 4.0–5.6)

## 2019-03-22 MED ORDER — INSULIN DETEMIR 100 UNIT/ML FLEXPEN: 130.0000 [IU] | PEN_INJECTOR | SUBCUTANEOUS | 3 refills | Status: DC

## 2019-03-22 NOTE — Patient Instructions (Signed)
Please reduce the Levemir to 130 units each morning.  check your blood sugar twice a day.  vary the time of day when you check, between before the 3 meals, and at bedtime.  also check if you have symptoms of your blood sugar being too high or too low.  please keep a record of the readings and bring it to your next appointment here (or you can bring the meter itself).  You can write it on any piece of paper.  please call us sooner if your blood sugar goes below 70, or if you have a lot of readings over 200. Please come back for a follow-up appointment in 3 months.

## 2019-03-22 NOTE — Progress Notes (Signed)
Subjective:    Patient ID: Courtney Goodman, female    DOB: 1945/03/04, 74 y.o.   MRN: [id number]  HPI Pt returns for f/u of diabetes mellitus: DM type: Insulin-requiring type 2 Dx'ed: 2919 Complications: renal insufficiency and CAD.  Therapy: insulin since 2009, and victoza. GDM: never DKA: never Severe hypoglycemia: 2012 and 2019.   Pancreatitis: never.  Other: she is on qd insulin, due to h/o noncompliance; ins declined tresiba.   Interval history: no cbg record, but states cbg varies from 55-289.  It is in general higher as the day goes on.  Past Medical History:  Diagnosis Date  . Anemia of chronic disease   . Asthma   . Atherosclerotic heart disease of native coronary artery without angina pectoris   . CKD (chronic kidney disease)   . COPD (chronic obstructive pulmonary disease) ([place])   . DM (diabetes mellitus), type 2 with renal complications ([place])   . Dysrhythmia   . Elevated ferritin 01/03/2017  . GAD (generalized anxiety disorder)   . Hypertension   . Mixed hyperlipidemia due to type 2 diabetes mellitus ([place])   . Mixed incontinence    per medical records from [facility]  . Osteopenia   . Personal history of noncompliance with medical treatment, presenting hazards to health   . Shortness of breath dyspnea   . Suicidal ideation   . TB (tuberculosis), treated    age 45  . Vitamin D deficiency     Past Surgical History:  Procedure Laterality Date  . ANKLE RECONSTRUCTION     right  . CARPAL TUNNEL RELEASE    . CORONARY ANGIOPLASTY      Social History   Socioeconomic History  . Marital status: Divorced    Spouse name: Not on file  . Number of children: 2  . Years of education: 56  . Highest education level: High school graduate  Occupational History  . Not on file  Tobacco Use  . Smoking status: Former Research scientist (life sciences)  . Smokeless tobacco: Never Used  Substance and Sexual Activity  . Alcohol use: No  . Drug use: No  . Sexual activity: Never  Other Topics Concern    . Not on file  Social History Narrative  . Not on file   Social Determinants of Health   Financial Resource Strain: Medium Risk  . Difficulty of Paying Living Expenses: Somewhat hard  Food Insecurity: Food Insecurity Present  . Worried About Charity fundraiser in the Last Year: Sometimes true  . Ran Out of Food in the Last Year: Sometimes true  Transportation Needs: No Transportation Needs  . Lack of Transportation (Medical): No  . Lack of Transportation (Non-Medical): No  Physical Activity: Inactive  . Days of Exercise per Week: 0 days  . Minutes of Exercise per Session: 0 min  Stress: Stress Concern Present  . Feeling of Stress : Very much  Social Connections: Somewhat Isolated  . Frequency of Communication with Friends and Family: More than three times a week  . Frequency of Social Gatherings with Friends and Family: More than three times a week  . Attends Religious Services: More than 4 times per year  . Active Member of Clubs or Organizations: No  . Attends Archivist Meetings: Never  . Marital Status: Divorced  Human resources officer Violence: Not At Risk  . Fear of Current or Ex-Partner: No  . Emotionally Abused: No  . Physically Abused: No  . Sexually Abused: No    Current Outpatient  Medications on File Prior to Visit  Medication Sig Dispense Refill  . albuterol (PROVENTIL HFA;VENTOLIN HFA) 108 (90 Base) MCG/ACT inhaler Inhale 2 puffs into the lungs every 6 (six) hours as needed for wheezing or shortness of breath. 1 Inhaler 0  . Alcohol Swabs (ALCOHOL PREP) PADS Use for testing 100 each 1  . aspirin EC 81 MG tablet Take 81 mg by mouth daily.     . Biotin 10000 MCG TABS Take 1 tablet by mouth daily.     . Blood Glucose Monitoring Suppl (TRUE METRIX METER) w/Device KIT 1 each by Does not apply route 2 (two) times daily. Use to monitor glucose levels BID; E11.29 1 kit 0  . carvedilol (COREG) 6.25 MG tablet Take 1 tablet by mouth twice daily. 180 tablet 0  .  Cholecalciferol 25 MCG (1000 UT) tablet Take 1 tablet by mouth daily. 90 tablet 0  . citalopram (CELEXA) 20 MG tablet Take 1 tablet (20 mg total) by mouth daily. (Patient taking differently: Take 30 mg by mouth daily. ) 90 tablet 1  . fenofibrate (TRICOR) 145 MG tablet Take 1 tablet by mouth daily. 90 tablet 0  . fluticasone (FLONASE) 50 MCG/ACT nasal spray Place 2 sprays into both nostrils daily. 48 g 0  . folic acid (FOLVITE) 1 MG tablet Take 1 tablet (1 mg total) by mouth daily. 90 tablet 0  . glucose blood (TRUE METRIX BLOOD GLUCOSE TEST) test strip 1 each by Other route 2 (two) times daily. use for testing 200 strip 2  . Insulin Pen Needle 31G X 5 MM MISC Inject 1 each into the skin 2 (two) times daily. Use to inject insulin twice daily; E11.29 100 each 1  . omeprazole (PRILOSEC) 40 MG capsule Take 1 capsule (40 mg total) by mouth daily. 90 capsule 1  . simvastatin (ZOCOR) 20 MG tablet Take 1 tablet by mouth every night. 90 tablet 0  . traZODone (DESYREL) 50 MG tablet Take 50 mg by mouth at bedtime.     Marland Kitchen VICTOZA 18 MG/3ML SOPN Inject 1.8 mg subcutaneously daily. 27 mL 1  . amLODipine-benazepril (LOTREL) 5-20 MG capsule Take 1 capsule by mouth daily. 90 capsule 0   No current facility-administered medications on file prior to visit.    No Known Allergies  Family History  Problem Relation Age of Onset  . Diabetes Mother     BP 102/60 (BP Location: Left Arm, Patient Position: Sitting, Cuff Size: Large)   Pulse 73   Ht 5' 4"  (1.626 m)   Wt 170 lb 6.4 oz (77.3 kg)   SpO2 97%   BMI 29.25 kg/m    Review of Systems Denies LOC    Objective:   Physical Exam VITAL SIGNS:  See vs page GENERAL: no distress Pulses: dorsalis pedis intact bilat.   MSK: no deformity of the feet CV: no leg edema Skin:  no ulcer on the feet, but the skin [place] dry.  normal color and temp on the feet. Neuro: sensation is intact to touch on the feet Ext: there is bilateral onychomycosis of the  toenails  Lab Results  Component Value Date   HGBA1C 7.6 (A) 03/22/2019   Lab Results  Component Value Date   CREATININE 2.21 (H) 10/03/2018   BUN 41 (H) 10/03/2018   NA 132 (L) 10/03/2018   K 4.1 10/03/2018   CL 97 (L) 10/03/2018   CO2 21 (L) 10/03/2018       Assessment & Plan:  Insulin-requiring type 2  DM, with CAD: this is the best control this pt should aim for, given this regimen, which does match insulin to her changing needs throughout the day Hypoglycemia: this limits aggressiveness of glycemic control Renal failure: we may need to change to NPH, but we'll try reducing Levemir first  Patient Instructions  Please reduce the Levemir to 130 units each morning.  check your blood sugar twice a day.  vary the time of day when you check, between before the 3 meals, and at bedtime.  also check if you have symptoms of your blood sugar being too high or too low.  please keep a record of the readings and bring it to your next appointment here (or you can bring the meter itself).  You can write it on any piece of paper.  please call us sooner if your blood sugar goes below 70, or if you have a lot of readings over 200. Please come back for a follow-up appointment in 3 months.

## 2019-03-24 ENCOUNTER — Encounter: Payer: Medicare HMO | Attending: Endocrinology | Admitting: Dietician

## 2019-03-24 ENCOUNTER — Other Ambulatory Visit: Payer: Self-pay

## 2019-03-24 ENCOUNTER — Encounter: Payer: Self-pay | Admitting: Dietician

## 2019-03-24 DIAGNOSIS — N183 Chronic kidney disease, stage 3 unspecified: Secondary | ICD-10-CM | POA: Diagnosis not present

## 2019-03-24 DIAGNOSIS — Z794 Long term (current) use of insulin: Secondary | ICD-10-CM | POA: Diagnosis not present

## 2019-03-24 DIAGNOSIS — E1122 Type 2 diabetes mellitus with diabetic chronic kidney disease: Secondary | ICD-10-CM | POA: Diagnosis not present

## 2019-03-24 NOTE — Patient Instructions (Addendum)
Remember to eat the following amounts of carbohydrates throughout the day:   Meals (Breakfast, Lunch, Dinner): 2-3 servings of carbohydrates   Snacks: 0-1 servings of carbohydrates    Use the Meal Ideas sheet to build your meals. Add vegetables, carbohydrates, and protein to your plate.   Use the Balanced Snacks sheet for snack ideas. Always add protein with your carbohydrates.

## 2019-03-24 NOTE — Progress Notes (Signed)
Diabetes Self-Management Education  Visit Type: First/Initial  03/24/2019  [name], identified by name and date of birth, is a 74 y.o. female with a diagnosis of Diabetes: Type 2.   ASSESSMENT  Diabetes Self-Management Education - 03/24/19 1045      Visit Information   Visit Type  First/Initial      Initial Visit   Diabetes Type  Type 2    Are you currently following a meal plan?  No    Are you taking your medications as prescribed?  Yes    Date Diagnosed  10 years ago      Health Coping   How would you rate your overall health?  Fair      Psychosocial Assessment   Patient Belief/Attitude about Diabetes  Defeat/Burnout   depressed; alone most of the time, daughter lives with her   Self-care barriers  Lack of transportation;Other (comment)   does not feel comfortable shopping and preparing food alone   Self-management support  Family    Other persons present  Patient    Patient Concerns  Nutrition/Meal planning    Special Needs  None    Preferred Learning Style  No preference indicated    Learning Readiness  Ready    What is the last grade level you completed in school?  89FY      Complications   Last HgB A1C per patient/outside source  7.6 %   03/22/2019   How often do you check your blood sugar?  1-2 times/day    Fasting Blood glucose range (mg/dL)  130-179;70-129    Postprandial Blood glucose range (mg/dL)  130-179    Have you had a dilated eye exam in the past 12 months?  No    Have you had a dental exam in the past 12 months?  No    Are you checking your feet?  Yes    How many days per week are you checking your feet?  7      Dietary Intake   Breakfast  1 egg + 1 slice toast + 1/2 pack of grits    Snack (morning)  popcorn    Lunch  salad + tomato + shrimp + ranch    Dinner  baked chicken + cream of mushroom soup + string beans + instant potatoes    Snack (evening)  graham crackers + peanut butter   or yogurt     Exercise   Exercise Type   ADL's;Light (walking / raking leaves)      Patient Education   Previous Diabetes Education  No    Disease state   Definition of diabetes, type 1 and 2, and the diagnosis of diabetes;Explored patient's options for treatment of their diabetes    Nutrition management   Role of diet in the treatment of diabetes and the relationship between the three main macronutrients and blood glucose level;Carbohydrate counting    Monitoring  Taught/evaluated SMBG meter.;Purpose and frequency of SMBG.;Identified appropriate SMBG and/or A1C goals.      Individualized Goals (developed by patient)   Nutrition  Follow meal plan discussed    Medications  take my medication as prescribed    Monitoring   test my blood glucose as discussed      Outcomes   Expected Outcomes  Demonstrated interest in learning. Expect positive outcomes    Future DMSE  4-6 wks    Program Status  Not Completed       Individualized Plan for Diabetes Self-Management Training:  Learning Objective:  Patient will have a greater understanding of diabetes self-management. Patient education plan is to attend individual and/or group sessions per assessed needs and concerns.   Plan:  Patient Instructions  Remember to eat the following amounts of carbohydrates throughout the day:   Meals (Breakfast, Lunch, Dinner): 2-3 servings of carbohydrates   Snacks: 0-1 servings of carbohydrates    Use the Meal Ideas sheet to build your meals. Add vegetables, carbohydrates, and protein to your plate.   Use the Balanced Snacks sheet for snack ideas. Always add protein with your carbohydrates.    Expected Outcomes:  Demonstrated interest in learning. Expect positive outcomes  Education material provided: Meal plan card, My Plate and Carbohydrate counting sheet  If problems or questions, patient to contact team via:  Phone and Email  Future DSME appointment: 4-6 wks

## 2019-04-08 ENCOUNTER — Other Ambulatory Visit: Payer: Self-pay | Admitting: Medical Oncology

## 2019-04-08 ENCOUNTER — Inpatient Hospital Stay: Payer: Medicare HMO

## 2019-04-08 ENCOUNTER — Other Ambulatory Visit: Payer: Self-pay

## 2019-04-08 ENCOUNTER — Inpatient Hospital Stay: Payer: Medicare HMO | Attending: Oncology

## 2019-04-08 VITALS — BP 119/71 | HR 78 | Temp 98.3°F | Resp 16

## 2019-04-08 DIAGNOSIS — Z79899 Other long term (current) drug therapy: Secondary | ICD-10-CM | POA: Insufficient documentation

## 2019-04-08 DIAGNOSIS — N189 Chronic kidney disease, unspecified: Secondary | ICD-10-CM | POA: Insufficient documentation

## 2019-04-08 DIAGNOSIS — I129 Hypertensive chronic kidney disease with stage 1 through stage 4 chronic kidney disease, or unspecified chronic kidney disease: Secondary | ICD-10-CM | POA: Insufficient documentation

## 2019-04-08 DIAGNOSIS — D638 Anemia in other chronic diseases classified elsewhere: Secondary | ICD-10-CM

## 2019-04-08 DIAGNOSIS — N183 Chronic kidney disease, stage 3 unspecified: Secondary | ICD-10-CM

## 2019-04-08 DIAGNOSIS — D631 Anemia in chronic kidney disease: Secondary | ICD-10-CM | POA: Insufficient documentation

## 2019-04-08 LAB — CBC WITH DIFFERENTIAL (CANCER CENTER ONLY)
Abs Immature Granulocytes: 0.03 10*3/uL (ref 0.00–0.07)
Basophils Absolute: 0 10*3/uL (ref 0.0–0.1)
Basophils Relative: 0 %
Eosinophils Absolute: 0.2 10*3/uL (ref 0.0–0.5)
Eosinophils Relative: 2 %
HCT: 31.7 % — ABNORMAL LOW (ref 36.0–46.0)
Hemoglobin: 9.9 g/dL — ABNORMAL LOW (ref 12.0–15.0)
Immature Granulocytes: 0 %
Lymphocytes Relative: 15 %
Lymphs Abs: 1.4 10*3/uL (ref 0.7–4.0)
MCH: 29.3 pg (ref 26.0–34.0)
MCHC: 31.2 g/dL (ref 30.0–36.0)
MCV: 93.8 fL (ref 80.0–100.0)
Monocytes Absolute: 0.6 10*3/uL (ref 0.1–1.0)
Monocytes Relative: 7 %
Neutro Abs: 6.9 10*3/uL (ref 1.7–7.7)
Neutrophils Relative %: 76 %
Platelet Count: 298 10*3/uL (ref 150–400)
RBC: 3.38 MIL/uL — ABNORMAL LOW (ref 3.87–5.11)
RDW: 16.3 % — ABNORMAL HIGH (ref 11.5–15.5)
WBC Count: 9.1 10*3/uL (ref 4.0–10.5)
nRBC: 0 % (ref 0.0–0.2)

## 2019-04-08 MED ORDER — EPOETIN ALFA-EPBX 40000 UNIT/ML IJ SOLN
40000.0000 [IU] | Freq: Once | INTRAMUSCULAR | Status: AC
  Administered 2019-04-08: 40000 [IU] via SUBCUTANEOUS

## 2019-04-08 MED ORDER — EPOETIN ALFA-EPBX 40000 UNIT/ML IJ SOLN
INTRAMUSCULAR | Status: AC
  Filled 2019-04-08: qty 1

## 2019-04-08 NOTE — Patient Instructions (Signed)

## 2019-04-10 ENCOUNTER — Ambulatory Visit: Payer: Medicare HMO

## 2019-04-10 ENCOUNTER — Other Ambulatory Visit: Payer: Self-pay | Admitting: Nurse Practitioner

## 2019-04-16 ENCOUNTER — Other Ambulatory Visit: Payer: Self-pay

## 2019-04-16 NOTE — Patient Outreach (Signed)
[facility]) Care Management  04/16/2019  Courtney Goodman 16-Aug-1945 [id number]   Telephone call to daughter [name] for disease management follow up.  No answer. Unable to leave a message.  Plan: RN CM will attempt again in the month of June.   Jone Baseman, RN, MSN [facility] Care Management Coordinator Direct Line [phone] Cell [phone] Toll Free: [phone]  Fax: [phone]

## 2019-04-20 ENCOUNTER — Encounter: Payer: Self-pay | Admitting: Podiatry

## 2019-04-20 ENCOUNTER — Ambulatory Visit (INDEPENDENT_AMBULATORY_CARE_PROVIDER_SITE_OTHER): Payer: Medicare HMO | Admitting: Podiatry

## 2019-04-20 ENCOUNTER — Other Ambulatory Visit: Payer: Self-pay | Admitting: Family Medicine

## 2019-04-20 ENCOUNTER — Other Ambulatory Visit: Payer: Self-pay

## 2019-04-20 ENCOUNTER — Encounter: Payer: Medicare HMO | Attending: Endocrinology | Admitting: Dietician

## 2019-04-20 VITALS — Temp 97.6°F

## 2019-04-20 DIAGNOSIS — M79609 Pain in unspecified limb: Secondary | ICD-10-CM

## 2019-04-20 DIAGNOSIS — M216X9 Other acquired deformities of unspecified foot: Secondary | ICD-10-CM | POA: Diagnosis not present

## 2019-04-20 DIAGNOSIS — N183 Chronic kidney disease, stage 3 unspecified: Secondary | ICD-10-CM

## 2019-04-20 DIAGNOSIS — B351 Tinea unguium: Secondary | ICD-10-CM

## 2019-04-20 DIAGNOSIS — E1122 Type 2 diabetes mellitus with diabetic chronic kidney disease: Secondary | ICD-10-CM | POA: Insufficient documentation

## 2019-04-20 DIAGNOSIS — Z794 Long term (current) use of insulin: Secondary | ICD-10-CM | POA: Insufficient documentation

## 2019-04-20 DIAGNOSIS — L84 Corns and callosities: Secondary | ICD-10-CM

## 2019-04-20 NOTE — Patient Instructions (Signed)
Diabetes Mellitus and Foot Care Foot care is an important part of your health, especially when you have diabetes. Diabetes may cause you to have problems because of poor blood flow (circulation) to your feet and legs, which can cause your skin to:  Become thinner and drier.  Break more easily.  Heal more slowly.  Peel and crack. You may also have nerve damage (neuropathy) in your legs and feet, causing decreased feeling in them. This means that you may not notice minor injuries to your feet that could lead to more serious problems. Noticing and addressing any potential problems early is the best way to prevent future foot problems. How to care for your feet Foot hygiene  Wash your feet daily with warm water and mild soap. Do not use hot water. Then, pat your feet and the areas between your toes until they are completely dry. Do not soak your feet as this can dry your skin.  Trim your toenails straight across. Do not dig under them or around the cuticle. File the edges of your nails with an emery board or nail file.  Apply a moisturizing lotion or petroleum jelly to the skin on your feet and to dry, brittle toenails. Use lotion that does not contain alcohol and is unscented. Do not apply lotion between your toes. Shoes and socks  Wear clean socks or stockings every day. Make sure they are not too tight. Do not wear knee-high stockings since they may decrease blood flow to your legs.  Wear shoes that fit properly and have enough cushioning. Always look in your shoes before you put them on to be sure there are no objects inside.  To break in new shoes, wear them for just a few hours a day. This prevents injuries on your feet. Wounds, scrapes, corns, and calluses  Check your feet daily for blisters, cuts, bruises, sores, and redness. If you cannot see the bottom of your feet, use a mirror or ask someone for help.  Do not cut corns or calluses or try to remove them with medicine.  If you  find a minor scrape, cut, or break in the skin on your feet, keep it and the skin around it clean and dry. You may clean these areas with mild soap and water. Do not clean the area with peroxide, alcohol, or iodine.  If you have a wound, scrape, corn, or callus on your foot, look at it several times a day to make sure it is healing and not infected. Check for: ? Redness, swelling, or pain. ? Fluid or blood. ? Warmth. ? Pus or a bad smell. General instructions  Do not cross your legs. This may decrease blood flow to your feet.  Do not use heating pads or hot water bottles on your feet. They may burn your skin. If you have lost feeling in your feet or legs, you may not know this is happening until it is too late.  Protect your feet from hot and cold by wearing shoes, such as at the beach or on hot pavement.  Schedule a complete foot exam at least once a year (annually) or more often if you have foot problems. If you have foot problems, report any cuts, sores, or bruises to your health care provider immediately. Contact a health care provider if:  You have a medical condition that increases your risk of infection and you have any cuts, sores, or bruises on your feet.  You have an injury that is not   healing.  You have redness on your legs or feet.  You feel burning or tingling in your legs or feet.  You have pain or cramps in your legs and feet.  Your legs or feet are numb.  Your feet always feel cold.  You have pain around a toenail. Get help right away if:  You have a wound, scrape, corn, or callus on your foot and: ? You have pain, swelling, or redness that gets worse. ? You have fluid or blood coming from the wound, scrape, corn, or callus. ? Your wound, scrape, corn, or callus feels warm to the touch. ? You have pus or a bad smell coming from the wound, scrape, corn, or callus. ? You have a fever. ? You have a red line going up your leg. Summary  Check your feet every day  for cuts, sores, red spots, swelling, and blisters.  Moisturize feet and legs daily.  Wear shoes that fit properly and have enough cushioning.  If you have foot problems, report any cuts, sores, or bruises to your health care provider immediately.  Schedule a complete foot exam at least once a year (annually) or more often if you have foot problems. This information is not intended to replace advice given to you by your health care provider. Make sure you discuss any questions you have with your health care provider. Document Revised: 10/07/2018 Document Reviewed: 02/16/2016 Elsevier Patient Education  2020 Elsevier Inc.  Corns and Calluses Corns are small areas of thickened skin that occur on the top, sides, or tip of a toe. They contain a cone-shaped core with a point that can press on a nerve below. This causes pain.  Calluses are areas of thickened skin that can occur anywhere on the body, including the hands, fingers, palms, soles of the feet, and heels. Calluses are usually larger than corns. What are the causes? Corns and calluses are caused by rubbing (friction) or pressure, such as from shoes that are too tight or do not fit properly. What increases the risk? Corns are more likely to develop in people who have misshapen toes (toe deformities), such as hammer toes. Calluses can occur with friction to any area of the skin. They are more likely to develop in people who:  Work with their hands.  Wear shoes that fit poorly, are too tight, or are high-heeled.  Have toe deformities. What are the signs or symptoms? Symptoms of a corn or callus include:  A hard growth on the skin.  Pain or tenderness under the skin.  Redness and swelling.  Increased discomfort while wearing tight-fitting shoes, if your feet are affected. If a corn or callus becomes infected, symptoms may include:  Redness and swelling that gets worse.  Pain.  Fluid, blood, or pus draining from the corn or  callus. How is this diagnosed? Corns and calluses may be diagnosed based on your symptoms, your medical history, and a physical exam. How is this treated? Treatment for corns and calluses may include:  Removing the cause of the friction or pressure. This may involve: ? Changing your shoes. ? Wearing shoe inserts (orthotics) or other protective layers in your shoes, such as a corn pad. ? Wearing gloves.  Applying medicine to the skin (topical medicine) to help soften skin in the hardened, thickened areas.  Removing layers of dead skin with a file to reduce the size of the corn or callus.  Removing the corn or callus with a scalpel or laser.  Taking   antibiotic medicines, if your corn or callus is infected.  Having surgery, if a toe deformity is the cause. Follow these instructions at home:   Take over-the-counter and prescription medicines only as told by your health care provider.  If you were prescribed an antibiotic, take it as told by your health care provider. Do not stop taking it even if your condition starts to improve.  Wear shoes that fit well. Avoid wearing high-heeled shoes and shoes that are too tight or too loose.  Wear any padding, protective layers, gloves, or orthotics as told by your health care provider.  Soak your hands or feet and then use a file or pumice stone to soften your corn or callus. Do this as told by your health care provider.  Check your corn or callus every day for symptoms of infection. Contact a health care provider if you:  Notice that your symptoms do not improve with treatment.  Have redness or swelling that gets worse.  Notice that your corn or callus becomes painful.  Have fluid, blood, or pus coming from your corn or callus.  Have new symptoms. Summary  Corns are small areas of thickened skin that occur on the top, sides, or tip of a toe.  Calluses are areas of thickened skin that can occur anywhere on the body, including the  hands, fingers, palms, and soles of the feet. Calluses are usually larger than corns.  Corns and calluses are caused by rubbing (friction) or pressure, such as from shoes that are too tight or do not fit properly.  Treatment may include wearing any padding, protective layers, gloves, or orthotics as told by your health care provider. This information is not intended to replace advice given to you by your health care provider. Make sure you discuss any questions you have with your health care provider. Document Revised: 05/06/2018 Document Reviewed: 11/27/2016 Elsevier Patient Education  2020 Elsevier Inc.  

## 2019-04-23 NOTE — Progress Notes (Signed)
Subjective: Courtney Goodman presents today for follow up of at risk foot care. Pt has h/o NIDDM with chronic kidney disease and painful mycotic nails b/l that are difficult to trim. Pain interferes with ambulation. Aggravating factors include wearing enclosed shoe gear. Pain is relieved with periodic professional debridement.   No Known Allergies   Objective: Vitals:   04/20/19 1101  Temp: 97.6 F (36.4 C)    Pt 74 y.o. year old AA female  in NAD. AAO x 3.   Vascular Examination:  Capillary fill time to digits <3 seconds b/l. Palpable DP pulses b/l. Palpable PT pulses b/l. Pedal hair absent b/l Skin temperature gradient within normal limits b/l.  Dermatological Examination: Pedal skin with normal turgor, texture and tone bilaterally. No open wounds bilaterally. No interdigital macerations bilaterally. Toenails 1-5 b/l elongated, dystrophic, thickened, crumbly with subungual debris and tenderness to dorsal palpation. Hyperkeratotic lesion(s) submet head 5 left foot and submet head 5 right foot.  No erythema, no edema, no drainage, no flocculence.  Musculoskeletal: Normal muscle strength 5/5 to all lower extremity muscle groups bilaterally, no pain crepitus or joint limitation noted with ROM b/l and hammertoes noted to the  R 4th toe and R 5th toe.  Neurological: Protective sensation intact 5/5 intact bilaterally with 10g monofilament b/l Vibratory sensation intact b/l  Assessment: 1. Pain due to onychomycosis of nail   2. Callus   3. Plantar flexed metatarsal, unspecified laterality   4. Type 2 diabetes mellitus with stage 3 chronic kidney disease, with long-term current use of insulin, unspecified whether stage 3a or 3b CKD ([place])    Plan: -Continue diabetic foot care principles. Literature dispensed on today.  -Toenails 1-5 b/l were debrided in length and girth with sterile nail nippers and dremel without iatrogenic bleeding.  -Discussed plantar flexed metatarsal that lead to  callus development. Discussed padding between visits for comfort. -Callus(es) submet head 5 left foot and submet head 5 right foot were debrided without complication or incident. Total number debrided =2. -Patient to continue soft, supportive shoe gear daily. -Patient to report any pedal injuries to medical professional immediately. -Patient/POA to call should there be question/concern in the interim.  Return in about 3 months (around 07/21/2019) for diabetic nail and callus trim.

## 2019-04-26 ENCOUNTER — Telehealth: Payer: Self-pay

## 2019-04-26 DIAGNOSIS — Z9114 Patient's other noncompliance with medication regimen: Secondary | ICD-10-CM

## 2019-04-26 DIAGNOSIS — F99 Mental disorder, not otherwise specified: Secondary | ICD-10-CM

## 2019-04-26 NOTE — Telephone Encounter (Signed)
Is this okay to reenter neurology referral? Pt never returned there calls

## 2019-04-26 NOTE — Telephone Encounter (Addendum)
Patient daughter Courtney Goodman) called to request a new referral to Neurology. She was seen before but a new referral is needed now due to expiration. Daughter also wants to know if it is recommended for patient to get covid vaccine due to all her health issues. Please advise.

## 2019-04-26 NOTE — Telephone Encounter (Signed)
Ok to refer her. I do not see any reason why she should not get the Covid vaccine.

## 2019-04-27 ENCOUNTER — Encounter: Payer: Self-pay | Admitting: Neurology

## 2019-04-27 NOTE — Telephone Encounter (Signed)
Pt's daughter was advised that referral was placed and that she could get the vaccine. Pt told daughter that her Diabetes doctor said that it wasn't recommended for her due to all her health problems, so pt was reaching out to you as her primary to make sure it was ok. She is not sure if her mom heard the doctor correct and wanted to double check

## 2019-04-27 NOTE — Telephone Encounter (Signed)
Pt's daughter was notified

## 2019-04-27 NOTE — Telephone Encounter (Signed)
I recommend that her daughter contact the provider who said it may not be safe to clear up any confusion before getting the vaccine.

## 2019-05-05 ENCOUNTER — Encounter: Payer: Self-pay | Admitting: Internal Medicine

## 2019-05-05 ENCOUNTER — Inpatient Hospital Stay: Payer: Medicare HMO

## 2019-05-05 ENCOUNTER — Other Ambulatory Visit: Payer: Self-pay

## 2019-05-05 ENCOUNTER — Inpatient Hospital Stay: Payer: Medicare HMO | Attending: Oncology | Admitting: Internal Medicine

## 2019-05-05 VITALS — BP 128/70 | HR 70 | Temp 98.0°F | Resp 17 | Ht 64.0 in | Wt 169.9 lb

## 2019-05-05 DIAGNOSIS — D631 Anemia in chronic kidney disease: Secondary | ICD-10-CM | POA: Diagnosis not present

## 2019-05-05 DIAGNOSIS — Z7982 Long term (current) use of aspirin: Secondary | ICD-10-CM | POA: Diagnosis not present

## 2019-05-05 DIAGNOSIS — Z79899 Other long term (current) drug therapy: Secondary | ICD-10-CM | POA: Diagnosis not present

## 2019-05-05 DIAGNOSIS — N183 Chronic kidney disease, stage 3 unspecified: Secondary | ICD-10-CM | POA: Diagnosis not present

## 2019-05-05 DIAGNOSIS — I129 Hypertensive chronic kidney disease with stage 1 through stage 4 chronic kidney disease, or unspecified chronic kidney disease: Secondary | ICD-10-CM | POA: Diagnosis not present

## 2019-05-05 DIAGNOSIS — E1122 Type 2 diabetes mellitus with diabetic chronic kidney disease: Secondary | ICD-10-CM | POA: Insufficient documentation

## 2019-05-05 DIAGNOSIS — M8580 Other specified disorders of bone density and structure, unspecified site: Secondary | ICD-10-CM | POA: Diagnosis not present

## 2019-05-05 DIAGNOSIS — D638 Anemia in other chronic diseases classified elsewhere: Secondary | ICD-10-CM

## 2019-05-05 DIAGNOSIS — I10 Essential (primary) hypertension: Secondary | ICD-10-CM

## 2019-05-05 DIAGNOSIS — J449 Chronic obstructive pulmonary disease, unspecified: Secondary | ICD-10-CM | POA: Insufficient documentation

## 2019-05-05 DIAGNOSIS — N189 Chronic kidney disease, unspecified: Secondary | ICD-10-CM | POA: Diagnosis not present

## 2019-05-05 DIAGNOSIS — I2510 Atherosclerotic heart disease of native coronary artery without angina pectoris: Secondary | ICD-10-CM | POA: Insufficient documentation

## 2019-05-05 DIAGNOSIS — Z794 Long term (current) use of insulin: Secondary | ICD-10-CM | POA: Diagnosis not present

## 2019-05-05 DIAGNOSIS — E782 Mixed hyperlipidemia: Secondary | ICD-10-CM | POA: Diagnosis not present

## 2019-05-05 DIAGNOSIS — F411 Generalized anxiety disorder: Secondary | ICD-10-CM | POA: Insufficient documentation

## 2019-05-05 LAB — CBC WITH DIFFERENTIAL (CANCER CENTER ONLY)
Abs Immature Granulocytes: 0.04 10*3/uL (ref 0.00–0.07)
Basophils Absolute: 0.1 10*3/uL (ref 0.0–0.1)
Basophils Relative: 1 %
Eosinophils Absolute: 0.2 10*3/uL (ref 0.0–0.5)
Eosinophils Relative: 2 %
HCT: 32 % — ABNORMAL LOW (ref 36.0–46.0)
Hemoglobin: 9.9 g/dL — ABNORMAL LOW (ref 12.0–15.0)
Immature Granulocytes: 1 %
Lymphocytes Relative: 19 %
Lymphs Abs: 1.7 10*3/uL (ref 0.7–4.0)
MCH: 29.5 pg (ref 26.0–34.0)
MCHC: 30.9 g/dL (ref 30.0–36.0)
MCV: 95.2 fL (ref 80.0–100.0)
Monocytes Absolute: 0.6 10*3/uL (ref 0.1–1.0)
Monocytes Relative: 7 %
Neutro Abs: 6.2 10*3/uL (ref 1.7–7.7)
Neutrophils Relative %: 70 %
Platelet Count: 325 10*3/uL (ref 150–400)
RBC: 3.36 MIL/uL — ABNORMAL LOW (ref 3.87–5.11)
RDW: 16 % — ABNORMAL HIGH (ref 11.5–15.5)
WBC Count: 8.7 10*3/uL (ref 4.0–10.5)
nRBC: 0 % (ref 0.0–0.2)

## 2019-05-05 LAB — IRON AND TIBC
Iron: 72 ug/dL (ref 41–142)
Saturation Ratios: 22 % (ref 21–57)
TIBC: 319 ug/dL (ref 236–444)
UIBC: 247 ug/dL (ref 120–384)

## 2019-05-05 LAB — FERRITIN: Ferritin: 277 ng/mL (ref 11–307)

## 2019-05-05 MED ORDER — EPOETIN ALFA-EPBX 40000 UNIT/ML IJ SOLN
40000.0000 [IU] | Freq: Once | INTRAMUSCULAR | Status: AC
  Administered 2019-05-05: 40000 [IU] via SUBCUTANEOUS

## 2019-05-05 MED ORDER — EPOETIN ALFA-EPBX 40000 UNIT/ML IJ SOLN
INTRAMUSCULAR | Status: AC
  Filled 2019-05-05: qty 1

## 2019-05-05 NOTE — Patient Instructions (Signed)

## 2019-05-05 NOTE — Progress Notes (Signed)
[place] [phone]   [phone]  OFFICE PROGRESS NOTE  [name], NP-C [place] [address]  DIAGNOSIS: Anemia of chronic disease secondary to chronic renal insufficiency  PRIOR THERAPY: None  CURRENT THERAPY: Retacrit 40,000 units subcutaneously every 2 weeks in addition to over-the-counter oral iron tablet 1 tablet every day.  INTERVAL HISTORY: Courtney Goodman 74 y.o. female returns to the clinic today for follow-up visit.  The patient is feeling fine today with no concerning complaints except for mild fatigue.  She denied having any chest pain, shortness of breath, cough or hemoptysis.  She denied having any fever or chills.  She has no nausea, vomiting, diarrhea or constipation.  She denied having any headache or visual changes.  She continues her treatment with Retacrit every 2 weeks.  She is here today for evaluation with repeat CBC and iron study.   MEDICAL HISTORY: Past Medical History:  Diagnosis Date  . Anemia of chronic disease   . Asthma   . Atherosclerotic heart disease of native coronary artery without angina pectoris   . CKD (chronic kidney disease)   . COPD (chronic obstructive pulmonary disease) ([place])   . DM (diabetes mellitus), type 2 with renal complications ([place])   . Dysrhythmia   . Elevated ferritin 01/03/2017  . GAD (generalized anxiety disorder)   . Hypertension   . Mixed hyperlipidemia due to type 2 diabetes mellitus ([place])   . Mixed incontinence    per medical records from [place]  . Osteopenia   . Personal history of noncompliance with medical treatment, presenting hazards to health   . Shortness of breath dyspnea   . Suicidal ideation   . TB (tuberculosis), treated    age 59  . Vitamin D deficiency     ALLERGIES:  has No Known Allergies.  MEDICATIONS:  Current Outpatient Medications  Medication Sig Dispense Refill  . albuterol (PROVENTIL HFA;VENTOLIN HFA) 108 (90 Base)  MCG/ACT inhaler Inhale 2 puffs into the lungs every 6 (six) hours as needed for wheezing or shortness of breath. 1 Inhaler 0  . Alcohol Swabs (ALCOHOL PREP) PADS Use for testing 100 each 1  . amLODipine-benazepril (LOTREL) 5-20 MG capsule Take 1 capsule by mouth daily. 90 capsule 0  . aspirin EC 81 MG tablet Take 81 mg by mouth daily.     . Biotin 10000 MCG TABS Take 1 tablet by mouth daily.     . Blood Glucose Monitoring Suppl (TRUE METRIX METER) w/Device KIT 1 each by Does not apply route 2 (two) times daily. Use to monitor glucose levels BID; E11.29 1 kit 0  . carvedilol (COREG) 6.25 MG tablet Take 1 tablet by mouth twice daily. 180 tablet 0  . Cholecalciferol 25 MCG (1000 UT) tablet Take 1 tablet by mouth daily. 90 tablet 0  . citalopram (CELEXA) 20 MG tablet Take 1 tablet (20 mg total) by mouth daily. (Patient taking differently: Take 30 mg by mouth daily. ) 90 tablet 1  . fenofibrate (TRICOR) 145 MG tablet Take 1 tablet by mouth daily. 90 tablet 0  . fluticasone (FLONASE) 50 MCG/ACT nasal spray Place 2 sprays into both nostrils daily. 48 g 0  . folic acid (FOLVITE) 1 MG tablet Take 1 tablet (1 mg total) by mouth daily. 90 tablet 0  . glucose blood (TRUE METRIX BLOOD GLUCOSE TEST) test strip 1 each by Other route 2 (two) times daily. use for testing 200 strip 2  .  Insulin Detemir (LEVEMIR) 100 UNIT/ML Pen Inject 130 Units into the skin every morning. 45 pen 3  . Insulin Pen Needle 31G X 5 MM MISC Inject 1 each into the skin 2 (two) times daily. Use to inject insulin twice daily; E11.29 100 each 1  . omeprazole (PRILOSEC) 40 MG capsule Take 1 capsule (40 mg total) by mouth daily. 90 capsule 1  . simvastatin (ZOCOR) 20 MG tablet Take 1 tablet by mouth every night. 90 tablet 0  . traZODone (DESYREL) 50 MG tablet Take 50 mg by mouth at bedtime.     Marland Kitchen VICTOZA 18 MG/3ML SOPN Inject 1.8 mg subcutaneously daily. 27 mL 1   No current facility-administered medications for this visit.    SURGICAL  HISTORY:  Past Surgical History:  Procedure Laterality Date  . ANKLE RECONSTRUCTION     right  . CARPAL TUNNEL RELEASE    . CORONARY ANGIOPLASTY      REVIEW OF SYSTEMS:  A comprehensive review of systems was negative except for: Constitutional: positive for fatigue   PHYSICAL EXAMINATION: General appearance: alert, cooperative and no distress Head: Normocephalic, without obvious abnormality, atraumatic Neck: no adenopathy, no JVD, supple, symmetrical, trachea midline and thyroid not enlarged, symmetric, no tenderness/mass/nodules Lymph nodes: Cervical, supraclavicular, and axillary nodes normal. Resp: clear to auscultation bilaterally Back: symmetric, no curvature. ROM normal. No CVA tenderness. Cardio: regular rate and rhythm, S1, S2 normal, no murmur, click, rub or gallop GI: soft, non-tender; bowel sounds normal; no masses,  no organomegaly Extremities: extremities normal, atraumatic, no cyanosis or edema  ECOG PERFORMANCE STATUS: 1 - Symptomatic but completely ambulatory  Blood pressure 128/70, pulse 70, temperature 98 F (36.7 C), temperature source Temporal, resp. rate 17, height 5' 4"  (1.626 m), weight 169 lb 14.4 oz (77.1 kg), SpO2 100 %.  LABORATORY DATA: Lab Results  Component Value Date   WBC 8.7 05/05/2019   HGB 9.9 (L) 05/05/2019   HCT 32.0 (L) 05/05/2019   MCV 95.2 05/05/2019   PLT 325 05/05/2019      Chemistry      Component Value Date/Time   NA 132 (L) 10/03/2018 2313   NA 146 (H) 05/07/2017 1147   NA 137 05/29/2011 1946   K 4.1 10/03/2018 2313   K 3.9 05/29/2011 1946   CL 97 (L) 10/03/2018 2251   CL 100 05/29/2011 1946   CO2 21 (L) 10/03/2018 2251   CO2 28 05/29/2011 1946   BUN 41 (H) 10/03/2018 2251   BUN 28 (H) 05/07/2017 1147   BUN 24 (H) 05/29/2011 1946   CREATININE 2.21 (H) 10/03/2018 2251   CREATININE 1.52 (H) 04/08/2018 1154   CREATININE 1.56 (H) 01/01/2017 0941      Component Value Date/Time   CALCIUM 9.7 10/03/2018 2251   CALCIUM 9.8  05/29/2011 1946   ALKPHOS 55 09/22/2018 1945   ALKPHOS 86 05/29/2011 1946   AST 17 09/22/2018 1945   AST 14 (L) 04/08/2018 1154   ALT 13 09/22/2018 1945   ALT 13 04/08/2018 1154   ALT 15 05/29/2011 1946   BILITOT 1.0 09/22/2018 1945   BILITOT 0.4 04/08/2018 1154       RADIOGRAPHIC STUDIES: No results found.  ASSESSMENT AND PLAN: This is a very pleasant 74 years old African-American female with anemia of chronic disease secondary to chronic renal insufficiency.  The patient is currently on treatment with Retacrit 40,000 units every 2 weeks and has been tolerating this treatment well.  She is also on oral iron tablets 1-2  tablets every day. CBC today showed persistent anemia with hemoglobin of 9.9. Iron study and ferritin are still pending. I recommended for the patient to continue her current treatment with Retacrit and the oral iron tablets for now. She will come back for follow-up visit in 3 months for evaluation with repeat CBC, iron study and ferritin. She was advised to call immediately if she has any concerning symptoms in the interval.  The patient voices understanding of current disease status and treatment options and is in agreement with the current care plan. All questions were answered. The patient knows to call the clinic with any problems, questions or concerns. We can certainly see the patient much sooner if necessary.   Disclaimer: This note was dictated with voice recognition software. Similar sounding words can inadvertently be transcribed and may not be corrected upon review.

## 2019-05-06 LAB — GLUCOSE, CAPILLARY: Glucose-Capillary: 119 mg/dL — ABNORMAL HIGH (ref 70–99)

## 2019-05-07 ENCOUNTER — Telehealth: Payer: Self-pay | Admitting: Internal Medicine

## 2019-05-07 NOTE — Telephone Encounter (Signed)
Scheduled per los. Called, no answer. Mailed printout  

## 2019-05-19 ENCOUNTER — Inpatient Hospital Stay: Payer: Medicare HMO

## 2019-05-20 ENCOUNTER — Other Ambulatory Visit: Payer: Self-pay | Admitting: Family Medicine

## 2019-05-31 DIAGNOSIS — N183 Chronic kidney disease, stage 3 unspecified: Secondary | ICD-10-CM | POA: Diagnosis not present

## 2019-05-31 DIAGNOSIS — I129 Hypertensive chronic kidney disease with stage 1 through stage 4 chronic kidney disease, or unspecified chronic kidney disease: Secondary | ICD-10-CM | POA: Diagnosis not present

## 2019-05-31 DIAGNOSIS — D649 Anemia, unspecified: Secondary | ICD-10-CM | POA: Diagnosis not present

## 2019-05-31 DIAGNOSIS — R413 Other amnesia: Secondary | ICD-10-CM | POA: Diagnosis not present

## 2019-05-31 DIAGNOSIS — K219 Gastro-esophageal reflux disease without esophagitis: Secondary | ICD-10-CM | POA: Diagnosis not present

## 2019-05-31 DIAGNOSIS — E1122 Type 2 diabetes mellitus with diabetic chronic kidney disease: Secondary | ICD-10-CM | POA: Diagnosis not present

## 2019-06-02 ENCOUNTER — Inpatient Hospital Stay: Payer: Medicare HMO

## 2019-06-04 ENCOUNTER — Other Ambulatory Visit: Payer: Self-pay

## 2019-06-04 ENCOUNTER — Inpatient Hospital Stay: Payer: Medicare HMO

## 2019-06-04 ENCOUNTER — Inpatient Hospital Stay: Payer: Medicare HMO | Attending: Oncology

## 2019-06-04 VITALS — BP 138/77 | HR 77 | Temp 98.1°F | Resp 18

## 2019-06-04 DIAGNOSIS — N189 Chronic kidney disease, unspecified: Secondary | ICD-10-CM | POA: Insufficient documentation

## 2019-06-04 DIAGNOSIS — N183 Chronic kidney disease, stage 3 unspecified: Secondary | ICD-10-CM

## 2019-06-04 DIAGNOSIS — I129 Hypertensive chronic kidney disease with stage 1 through stage 4 chronic kidney disease, or unspecified chronic kidney disease: Secondary | ICD-10-CM | POA: Insufficient documentation

## 2019-06-04 DIAGNOSIS — D638 Anemia in other chronic diseases classified elsewhere: Secondary | ICD-10-CM

## 2019-06-04 DIAGNOSIS — D631 Anemia in chronic kidney disease: Secondary | ICD-10-CM | POA: Diagnosis not present

## 2019-06-04 DIAGNOSIS — Z79899 Other long term (current) drug therapy: Secondary | ICD-10-CM | POA: Diagnosis not present

## 2019-06-04 LAB — CBC WITH DIFFERENTIAL (CANCER CENTER ONLY)
Abs Immature Granulocytes: 0.05 10*3/uL (ref 0.00–0.07)
Basophils Absolute: 0.1 10*3/uL (ref 0.0–0.1)
Basophils Relative: 1 %
Eosinophils Absolute: 0.2 10*3/uL (ref 0.0–0.5)
Eosinophils Relative: 2 %
HCT: 33.7 % — ABNORMAL LOW (ref 36.0–46.0)
Hemoglobin: 10.6 g/dL — ABNORMAL LOW (ref 12.0–15.0)
Immature Granulocytes: 1 %
Lymphocytes Relative: 16 %
Lymphs Abs: 1.7 10*3/uL (ref 0.7–4.0)
MCH: 28.8 pg (ref 26.0–34.0)
MCHC: 31.5 g/dL (ref 30.0–36.0)
MCV: 91.6 fL (ref 80.0–100.0)
Monocytes Absolute: 0.8 10*3/uL (ref 0.1–1.0)
Monocytes Relative: 8 %
Neutro Abs: 7.6 10*3/uL (ref 1.7–7.7)
Neutrophils Relative %: 72 %
Platelet Count: 322 10*3/uL (ref 150–400)
RBC: 3.68 MIL/uL — ABNORMAL LOW (ref 3.87–5.11)
RDW: 16 % — ABNORMAL HIGH (ref 11.5–15.5)
WBC Count: 10.5 10*3/uL (ref 4.0–10.5)
nRBC: 0 % (ref 0.0–0.2)

## 2019-06-04 MED ORDER — EPOETIN ALFA-EPBX 40000 UNIT/ML IJ SOLN
40000.0000 [IU] | Freq: Once | INTRAMUSCULAR | Status: AC
  Administered 2019-06-04: 40000 [IU] via SUBCUTANEOUS

## 2019-06-04 MED ORDER — EPOETIN ALFA-EPBX 40000 UNIT/ML IJ SOLN
INTRAMUSCULAR | Status: AC
  Filled 2019-06-04: qty 1

## 2019-06-16 ENCOUNTER — Inpatient Hospital Stay: Payer: Medicare HMO

## 2019-06-16 ENCOUNTER — Other Ambulatory Visit: Payer: Self-pay

## 2019-06-16 VITALS — BP 154/75 | Temp 98.6°F | Resp 18

## 2019-06-16 DIAGNOSIS — D631 Anemia in chronic kidney disease: Secondary | ICD-10-CM | POA: Diagnosis not present

## 2019-06-16 DIAGNOSIS — Z79899 Other long term (current) drug therapy: Secondary | ICD-10-CM | POA: Diagnosis not present

## 2019-06-16 DIAGNOSIS — D638 Anemia in other chronic diseases classified elsewhere: Secondary | ICD-10-CM

## 2019-06-16 DIAGNOSIS — N189 Chronic kidney disease, unspecified: Secondary | ICD-10-CM | POA: Diagnosis not present

## 2019-06-16 DIAGNOSIS — N183 Chronic kidney disease, stage 3 unspecified: Secondary | ICD-10-CM

## 2019-06-16 DIAGNOSIS — I129 Hypertensive chronic kidney disease with stage 1 through stage 4 chronic kidney disease, or unspecified chronic kidney disease: Secondary | ICD-10-CM | POA: Diagnosis not present

## 2019-06-16 LAB — CBC WITH DIFFERENTIAL (CANCER CENTER ONLY)
Abs Immature Granulocytes: 0.02 10*3/uL (ref 0.00–0.07)
Basophils Absolute: 0 10*3/uL (ref 0.0–0.1)
Basophils Relative: 1 %
Eosinophils Absolute: 0.1 10*3/uL (ref 0.0–0.5)
Eosinophils Relative: 2 %
HCT: 33.6 % — ABNORMAL LOW (ref 36.0–46.0)
Hemoglobin: 10.6 g/dL — ABNORMAL LOW (ref 12.0–15.0)
Immature Granulocytes: 0 %
Lymphocytes Relative: 24 %
Lymphs Abs: 1.4 10*3/uL (ref 0.7–4.0)
MCH: 29.2 pg (ref 26.0–34.0)
MCHC: 31.5 g/dL (ref 30.0–36.0)
MCV: 92.6 fL (ref 80.0–100.0)
Monocytes Absolute: 0.4 10*3/uL (ref 0.1–1.0)
Monocytes Relative: 7 %
Neutro Abs: 3.9 10*3/uL (ref 1.7–7.7)
Neutrophils Relative %: 66 %
Platelet Count: 328 10*3/uL (ref 150–400)
RBC: 3.63 MIL/uL — ABNORMAL LOW (ref 3.87–5.11)
RDW: 17.3 % — ABNORMAL HIGH (ref 11.5–15.5)
WBC Count: 5.9 10*3/uL (ref 4.0–10.5)
nRBC: 0 % (ref 0.0–0.2)

## 2019-06-16 MED ORDER — EPOETIN ALFA-EPBX 40000 UNIT/ML IJ SOLN
40000.0000 [IU] | Freq: Once | INTRAMUSCULAR | Status: AC
  Administered 2019-06-16: 40000 [IU] via SUBCUTANEOUS

## 2019-06-16 MED ORDER — EPOETIN ALFA-EPBX 10000 UNIT/ML IJ SOLN
INTRAMUSCULAR | Status: AC
  Filled 2019-06-16: qty 4

## 2019-06-16 NOTE — Patient Instructions (Signed)

## 2019-06-19 ENCOUNTER — Other Ambulatory Visit: Payer: Self-pay | Admitting: Family Medicine

## 2019-06-21 ENCOUNTER — Other Ambulatory Visit: Payer: Self-pay

## 2019-06-21 ENCOUNTER — Encounter: Payer: Self-pay | Admitting: Endocrinology

## 2019-06-21 ENCOUNTER — Ambulatory Visit (INDEPENDENT_AMBULATORY_CARE_PROVIDER_SITE_OTHER): Payer: Medicare HMO | Admitting: Endocrinology

## 2019-06-21 VITALS — BP 104/60 | HR 79 | Ht 64.0 in | Wt 164.0 lb

## 2019-06-21 DIAGNOSIS — Z794 Long term (current) use of insulin: Secondary | ICD-10-CM | POA: Diagnosis not present

## 2019-06-21 DIAGNOSIS — E1121 Type 2 diabetes mellitus with diabetic nephropathy: Secondary | ICD-10-CM | POA: Diagnosis not present

## 2019-06-21 DIAGNOSIS — N183 Chronic kidney disease, stage 3 unspecified: Secondary | ICD-10-CM | POA: Diagnosis not present

## 2019-06-21 DIAGNOSIS — E1122 Type 2 diabetes mellitus with diabetic chronic kidney disease: Secondary | ICD-10-CM | POA: Diagnosis not present

## 2019-06-21 DIAGNOSIS — N1831 Chronic kidney disease, stage 3a: Secondary | ICD-10-CM

## 2019-06-21 LAB — GLUCOSE, POCT (MANUAL RESULT ENTRY): POC Glucose: 124 mg/dl — AB (ref 70–99)

## 2019-06-21 LAB — POCT GLYCOSYLATED HEMOGLOBIN (HGB A1C): Hemoglobin A1C: 7.5 % — AB (ref 4.0–5.6)

## 2019-06-21 MED ORDER — AMLODIPINE BESY-BENAZEPRIL HCL 5-20 MG PO CAPS: 1.0000 | ORAL_CAPSULE | Freq: Every day | ORAL | 0 refills | Status: DC

## 2019-06-21 MED ORDER — INSULIN DETEMIR 100 UNIT/ML FLEXPEN: 100.0000 [IU] | PEN_INJECTOR | SUBCUTANEOUS | 3 refills | Status: DC

## 2019-06-21 NOTE — Patient Instructions (Addendum)
Please reduce the Levemir to 100 units each morning.  Today only, eat plenty, to avoid the sugar from being low in the morning.   Please see your primary care provider for your respiratory symptoms.   check your blood sugar twice a day.  vary the time of day when you check, between before the 3 meals, and at bedtime.  also check if you have symptoms of your blood sugar being too high or too low.  please keep a record of the readings and bring it to your next appointment here (or you can bring the meter itself).  You can write it on any piece of paper.  please call us sooner if your blood sugar goes below 70, or if you have a lot of readings over 200. Please come back for a follow-up appointment in 1 week.

## 2019-06-21 NOTE — Telephone Encounter (Signed)
Ok to give her 30 days and have her come in for a visit. Thanks.

## 2019-06-21 NOTE — Telephone Encounter (Signed)
Is this ok to refill amlodipine-benazepril pt. Last apt. Was 10/12/18 and has no future apt scheduled

## 2019-06-21 NOTE — Progress Notes (Signed)
Subjective:    Patient ID: Courtney Goodman, female    DOB: April 11, 1945, 74 y.o.   MRN: [id number]  HPI Pt returns for f/u of diabetes mellitus: DM type: Insulin-requiring type 2 Dx'ed: 1749 Complications: renal insufficiency and CAD.  Therapy: insulin since 2009, and victoza. GDM: never DKA: never Severe hypoglycemia: 2012 and 2019.   Pancreatitis: never.  SDOH: ins declined tresiba.   Other: she is on qd insulin, due to h/o noncompliance.   Interval history: no cbg record, but states cbg varies from 55-289.  It is in general higher as the day goes on.  Yesterday, pt had an episode of severe hypoglycemia, before breakfast.  Pt says dtr [name]) noted cbg of 40.  Pt has had URI sxs recently.  Pt is alone here today (was brought by [facility] Lucianne Lei today).   Past Medical History:  Diagnosis Date  . Anemia of chronic disease   . Asthma   . Atherosclerotic heart disease of native coronary artery without angina pectoris   . CKD (chronic kidney disease)   . COPD (chronic obstructive pulmonary disease) ([place])   . DM (diabetes mellitus), type 2 with renal complications ([place])   . Dysrhythmia   . Elevated ferritin 01/03/2017  . GAD (generalized anxiety disorder)   . Hypertension   . Mixed hyperlipidemia due to type 2 diabetes mellitus ([place])   . Mixed incontinence    per medical records from [place]  . Osteopenia   . Personal history of noncompliance with medical treatment, presenting hazards to health   . Shortness of breath dyspnea   . Suicidal ideation   . TB (tuberculosis), treated    age 7  . Vitamin D deficiency     Past Surgical History:  Procedure Laterality Date  . ANKLE RECONSTRUCTION     right  . CARPAL TUNNEL RELEASE    . CORONARY ANGIOPLASTY      Social History   Socioeconomic History  . Marital status: Divorced    Spouse name: Not on file  . Number of children: 2  . Years of education: 1  . Highest education level: High school graduate  Occupational History  . Not  on file  Tobacco Use  . Smoking status: Former Research scientist (life sciences)  . Smokeless tobacco: Never Used  Substance and Sexual Activity  . Alcohol use: No  . Drug use: No  . Sexual activity: Never  Other Topics Concern  . Not on file  Social History Narrative  . Not on file   Social Determinants of Health   Financial Resource Strain: Medium Risk  . Difficulty of Paying Living Expenses: Somewhat hard  Food Insecurity: Food Insecurity Present  . Worried About Charity fundraiser in the Last Year: Sometimes true  . Ran Out of Food in the Last Year: Sometimes true  Transportation Needs: No Transportation Needs  . Lack of Transportation (Medical): No  . Lack of Transportation (Non-Medical): No  Physical Activity: Inactive  . Days of Exercise per Week: 0 days  . Minutes of Exercise per Session: 0 min  Stress: Stress Concern Present  . Feeling of Stress : Very much  Social Connections: Somewhat Isolated  . Frequency of Communication with Friends and Family: More than three times a week  . Frequency of Social Gatherings with Friends and Family: More than three times a week  . Attends Religious Services: More than 4 times per year  . Active Member of Clubs or Organizations: No  . Attends Archivist  Meetings: Never  . Marital Status: Divorced  Human resources officer Violence: Not At Risk  . Fear of Current or Ex-Partner: No  . Emotionally Abused: No  . Physically Abused: No  . Sexually Abused: No    Current Outpatient Medications on File Prior to Visit  Medication Sig Dispense Refill  . albuterol (PROVENTIL HFA;VENTOLIN HFA) 108 (90 Base) MCG/ACT inhaler Inhale 2 puffs into the lungs every 6 (six) hours as needed for wheezing or shortness of breath. 1 Inhaler 0  . Alcohol Swabs (ALCOHOL PREP) PADS Use for testing 100 each 1  . aspirin EC 81 MG tablet Take 81 mg by mouth daily.     . Biotin 10000 MCG TABS Take 1 tablet by mouth daily.     . Blood Glucose Monitoring Suppl (TRUE METRIX METER)  w/Device KIT 1 each by Does not apply route 2 (two) times daily. Use to monitor glucose levels BID; E11.29 1 kit 0  . Cholecalciferol 25 MCG (1000 UT) tablet Take 1 tablet by mouth daily. 90 tablet 0  . citalopram (CELEXA) 20 MG tablet Take 1 tablet (20 mg total) by mouth daily. (Patient taking differently: Take 30 mg by mouth daily. ) 90 tablet 1  . fenofibrate (TRICOR) 145 MG tablet Take 1 tablet by mouth daily. 90 tablet 0  . fluticasone (FLONASE) 50 MCG/ACT nasal spray Place 2 sprays into both nostrils daily. 48 g 0  . folic acid (FOLVITE) 1 MG tablet Take 1 tablet (1 mg total) by mouth daily. 90 tablet 0  . glucose blood (TRUE METRIX BLOOD GLUCOSE TEST) test strip 1 each by Other route 2 (two) times daily. use for testing 200 strip 2  . Insulin Pen Needle 31G X 5 MM MISC Inject 1 each into the skin 2 (two) times daily. Use to inject insulin twice daily; E11.29 100 each 1  . simvastatin (ZOCOR) 20 MG tablet Take 1 tablet by mouth every night. 90 tablet 0  . traZODone (DESYREL) 50 MG tablet Take 50 mg by mouth at bedtime.     Marland Kitchen VICTOZA 18 MG/3ML SOPN Inject 1.8 mg subcutaneously daily. 27 mL 1   No current facility-administered medications on file prior to visit.    No Known Allergies  Family History  Problem Relation Age of Onset  . Diabetes Mother     BP 104/60   Pulse 79   Ht 5' 4"  (1.626 m)   Wt 164 lb (74.4 kg)   SpO2 97%   BMI 28.15 kg/m    Review of Systems Denies fever and sob.      Objective:   Physical Exam VITAL SIGNS:  See vs page GENERAL: no distress Pulses: dorsalis pedis intact bilat.   MSK: no deformity of the feet CV: no leg edema Skin:  no ulcer on the feet.  normal color and temp on the feet. Neuro: sensation is intact to touch on the feet Ext: there is bilateral onychomycosis of the toenails  Lab Results  Component Value Date   HGBA1C 7.5 (A) 06/21/2019        Assessment & Plan:  Insulin-requiring type 2 DM, with CAD Hypoglycemia, due to  insulin: this limits aggressiveness of glycemic control   Patient Instructions  Please reduce the Levemir to 100 units each morning.  Today only, eat plenty, to avoid the sugar from being low in the morning.   Please see your primary care provider for your respiratory symptoms.   check your blood sugar twice a day.  vary  the time of day when you check, between before the 3 meals, and at bedtime.  also check if you have symptoms of your blood sugar being too high or too low.  please keep a record of the readings and bring it to your next appointment here (or you can bring the meter itself).  You can write it on any piece of paper.  please call us sooner if your blood sugar goes below 70, or if you have a lot of readings over 200. Please come back for a follow-up appointment in 1 week.

## 2019-06-21 NOTE — Telephone Encounter (Signed)
Pt. Scheduled and medication sent in for a 30 day supply

## 2019-06-22 ENCOUNTER — Telehealth: Payer: Self-pay

## 2019-06-22 NOTE — Telephone Encounter (Signed)
I changed her apt. Tomorrow to a virtual and she is aware.

## 2019-06-22 NOTE — Telephone Encounter (Signed)
The pt. Called and wanted to know if you were going to call her in something before her apt. Tomorrow at 11:45 I told her the last thing that was called in was her BP medicine yesterday. She then told me that she has been feeling bad for about 3 weeks sneezing, runny nose, coughing, and she had both her covid shots about 1 month ago. I told her we may have to change her apt to a virtual apt. And that we usually don't call in medicine before you have been seen for something.

## 2019-06-22 NOTE — Telephone Encounter (Signed)
You are correct. Let's have her do a virtual visit tomorrow or Thursday.

## 2019-06-23 ENCOUNTER — Encounter: Payer: Medicare HMO | Admitting: Family Medicine

## 2019-06-23 ENCOUNTER — Telehealth (INDEPENDENT_AMBULATORY_CARE_PROVIDER_SITE_OTHER): Payer: Medicare HMO | Admitting: Family Medicine

## 2019-06-23 ENCOUNTER — Other Ambulatory Visit: Payer: Self-pay | Admitting: *Deleted

## 2019-06-23 ENCOUNTER — Encounter: Payer: Self-pay | Admitting: Family Medicine

## 2019-06-23 ENCOUNTER — Other Ambulatory Visit: Payer: Self-pay

## 2019-06-23 VITALS — BP 106/64 | HR 66 | Ht 64.0 in | Wt 164.0 lb

## 2019-06-23 DIAGNOSIS — E1121 Type 2 diabetes mellitus with diabetic nephropathy: Secondary | ICD-10-CM

## 2019-06-23 DIAGNOSIS — K219 Gastro-esophageal reflux disease without esophagitis: Secondary | ICD-10-CM | POA: Diagnosis not present

## 2019-06-23 DIAGNOSIS — J069 Acute upper respiratory infection, unspecified: Secondary | ICD-10-CM

## 2019-06-23 DIAGNOSIS — J449 Chronic obstructive pulmonary disease, unspecified: Secondary | ICD-10-CM | POA: Diagnosis not present

## 2019-06-23 DIAGNOSIS — I10 Essential (primary) hypertension: Secondary | ICD-10-CM | POA: Diagnosis not present

## 2019-06-23 MED ORDER — CARVEDILOL 6.25 MG PO TABS: 6.2500 mg | ORAL_TABLET | Freq: Two times a day (BID) | ORAL | 0 refills | Status: DC

## 2019-06-23 MED ORDER — OMEPRAZOLE 40 MG PO CPDR: 40.0000 mg | DELAYED_RELEASE_CAPSULE | Freq: Every day | ORAL | 0 refills | Status: DC

## 2019-06-23 MED ORDER — AMLODIPINE BESY-BENAZEPRIL HCL 5-20 MG PO CAPS: 1.0000 | ORAL_CAPSULE | Freq: Every day | ORAL | 0 refills | Status: DC

## 2019-06-23 NOTE — Progress Notes (Signed)
Subjective:  Documentation for virtual audio and video telecommunications through [place] encounter attempted but patient unable to figure out how to make this work after several attempts. A phone call was used to complete the visit.   The patient was located at home. 2 patient identifiers used.  The provider was located in the office. The patient did consent to this visit and is aware of possible charges through their insurance for this visit.  The other persons participating in this telemedicine service were her daughter Courtney Goodman.  Time spent on call was 23 minutes and in review of previous records 30 minutes total.  This virtual service is not related to other E/M service within previous 7 days.   Patient ID: Courtney Goodman, female    DOB: 03-31-1945, 74 y.o.   MRN: [id number]  HPI Chief Complaint  Patient presents with  . Med check    for hypertension.  . Cough    and sneezing and runny nose that started about a week ago.    This is a medication visit to discuss hypertension.  This visit was supposed to have occured in the office however, she called yesterday and is having symptoms of sneezing, rhinorrhea and cough so it was switched to a virtual visit.  My last visit with her in office was on 10/12/2018.  Since then she has been to her nephrologist and had labs. Her serum creatinine was 1.53 and at her baseline on 05/31/2019.   Complains of cold symptoms started last week. Rhinorrhea, nasal congestion, frontal headache.  Complains of a mild cough and occasionally coughs up phlegm. States she has shortness of breath with walking. She has been using her albuterol inhaler and it helps. She has underlying COPD. Some mild nausea but no vomiting or diarrhea.  Denies fever, chills, body aches, dizziness, ear pain, sore throat, chest pain, palpitations, abdominal pain, diarrhea, urinary symptoms.   States she is having heartburn after eating certain foods. Taking omeprazole. Still has  to take Tums at times.   She did get her Covid vaccines with the last one on May 6th 2021.   Memory issues- has upcoming appointment with neurologist in July.   She has several specialists.   Dr. Loanne Drilling treats her diabetes.  Last seen there 06/21/2019 and her blood pressure was 104/60. She is under the care of Dr. Julien Nordmann for anemia of chronic disease Her blood pressure on May 05, 2019 was 128/70. Dr. Humphrey Rolls in [place] is her cardiologist.   Her daughter checks her blood pressure sporadically and today her blood pressure at home was 106/64.  She is also followed by nephrology and her psychiatrist.    Review of Systems Pertinent positives and negatives in the history of present illness.     Objective:   Physical Exam BP 106/64   Pulse 66   Ht 5\' 4"  (1.626 m)   Wt 164 lb (74.4 kg)   BMI 28.15 kg/m   Alert and in no acute distress. Speaking in complete sentences without difficulty. Normal speech, mood and thought process.       Assessment & Plan:  Essential hypertension - BP appears to be well controlled. Continue medications and eat a low sodium diet.   Diabetic nephropathy associated with type 2 diabetes mellitus ([place]) -follow up with endocrinology and nephrology  Acute URI -discussed symptomatic treatment. She may get a Covid test as well but she has had both vaccines.   Chronic obstructive pulmonary disease, unspecified COPD type ([place]) -no flares  and has albuterol to use prn  Gastroesophageal reflux disease without esophagitis -continue to avoid food triggers and stay on omeprazole for now. Follow up if not controlled.

## 2019-06-24 ENCOUNTER — Other Ambulatory Visit: Payer: Self-pay

## 2019-06-29 ENCOUNTER — Ambulatory Visit: Payer: Medicare HMO | Admitting: Endocrinology

## 2019-06-29 ENCOUNTER — Other Ambulatory Visit: Payer: Self-pay | Admitting: Medical Oncology

## 2019-06-30 ENCOUNTER — Inpatient Hospital Stay: Payer: Medicare HMO

## 2019-06-30 ENCOUNTER — Inpatient Hospital Stay: Payer: Medicare HMO | Attending: Oncology

## 2019-06-30 ENCOUNTER — Other Ambulatory Visit: Payer: Self-pay

## 2019-06-30 DIAGNOSIS — N189 Chronic kidney disease, unspecified: Secondary | ICD-10-CM | POA: Diagnosis not present

## 2019-06-30 DIAGNOSIS — D631 Anemia in chronic kidney disease: Secondary | ICD-10-CM | POA: Diagnosis not present

## 2019-06-30 DIAGNOSIS — I129 Hypertensive chronic kidney disease with stage 1 through stage 4 chronic kidney disease, or unspecified chronic kidney disease: Secondary | ICD-10-CM | POA: Insufficient documentation

## 2019-06-30 DIAGNOSIS — N183 Chronic kidney disease, stage 3 unspecified: Secondary | ICD-10-CM

## 2019-06-30 DIAGNOSIS — D638 Anemia in other chronic diseases classified elsewhere: Secondary | ICD-10-CM

## 2019-06-30 LAB — HEMOGLOBIN: Hemoglobin: 12.1 g/dL (ref 12.0–15.0)

## 2019-06-30 NOTE — Progress Notes (Signed)
Pt here today for Retacrit injection, 12.1 HGB today no injection needed at this point. Copy of labs given to patient

## 2019-07-08 ENCOUNTER — Other Ambulatory Visit: Payer: Self-pay

## 2019-07-08 NOTE — Patient Outreach (Signed)
[facility]) Care Management  [place]  07/08/2019   Courtney Goodman 09-28-1945 [id number]  Subjective: Telephone call to daughter [name]. She reports that her mother is doing good.  She reports that her sugars doing good.  Last A1c check was 7.5.  Discussed importance of diet and medication adherence.  She verbalized understanding and voices no concerns.    Objective:   Encounter Medications:  Outpatient Encounter Medications as of 07/08/2019  Medication Sig  . albuterol (PROVENTIL HFA;VENTOLIN HFA) 108 (90 Base) MCG/ACT inhaler Inhale 2 puffs into the lungs every 6 (six) hours as needed for wheezing or shortness of breath.  . Alcohol Swabs (ALCOHOL PREP) PADS Use for testing  . amLODipine-benazepril (LOTREL) 5-20 MG capsule Take 1 capsule by mouth daily.  Marland Kitchen aspirin EC 81 MG tablet Take 81 mg by mouth daily.   . Biotin 10000 MCG TABS Take 1 tablet by mouth daily.   . Blood Glucose Monitoring Suppl (TRUE METRIX METER) w/Device KIT 1 each by Does not apply route 2 (two) times daily. Use to monitor glucose levels BID; E11.29  . carvedilol (COREG) 6.25 MG tablet Take 1 tablet (6.25 mg total) by mouth 2 (two) times daily.  . Cholecalciferol 25 MCG (1000 UT) tablet Take 1 tablet by mouth daily.  . citalopram (CELEXA) 20 MG tablet Take 1 tablet (20 mg total) by mouth daily. (Patient taking differently: Take 30 mg by mouth daily. )  . fenofibrate (TRICOR) 145 MG tablet Take 1 tablet by mouth daily.  . fluticasone (FLONASE) 50 MCG/ACT nasal spray Place 2 sprays into both nostrils daily.  . folic acid (FOLVITE) 1 MG tablet Take 1 tablet (1 mg total) by mouth daily.  Marland Kitchen glucose blood (TRUE METRIX BLOOD GLUCOSE TEST) test strip 1 each by Other route 2 (two) times daily. use for testing  . insulin detemir (LEVEMIR) 100 UNIT/ML FlexPen Inject 100 Units into the skin every morning.  . Insulin Pen Needle 31G X 5 MM MISC Inject 1 each into the skin 2 (two) times daily. Use to  inject insulin twice daily; E11.29  . omeprazole (PRILOSEC) 40 MG capsule Take 1 capsule (40 mg total) by mouth daily.  . simvastatin (ZOCOR) 20 MG tablet Take 1 tablet by mouth every night.  . traZODone (DESYREL) 50 MG tablet Take 50 mg by mouth at bedtime.   Marland Kitchen VICTOZA 18 MG/3ML SOPN Inject 1.8 mg subcutaneously daily.   No facility-administered encounter medications on file as of 07/08/2019.    Functional Status:  In your present state of health, do you have any difficulty performing the following activities: 07/08/2019 11/19/2018  Hearing? Y Y  Comment - -  Vision? N N  Difficulty concentrating or making decisions? N N  Comment - -  Walking or climbing stairs? N N  Comment - -  Dressing or bathing? Y Y  Comment tiredness tiredness  Doing errands, shopping? [name]  Comment daughter handles daughter Engineer, building services and eating ? N N  Using the Toilet? N N  In the past six months, have you accidently leaked urine? N N  Do you have problems with loss of bowel control? N N  Managing your Medications? N N  Managing your Finances? N N  Comment - -  Housekeeping or managing your Housekeeping? [name]  Comment daughter helps daughter helps  Some recent data might be hidden    Fall/Depression Screening: Fall Risk  07/08/2019 10/12/2018 10/08/2018  Falls in the past  year? 0 0 0  Number falls in past yr: - 0 0  Injury with Fall? - 0 0  Risk Factor Category  - - -  Risk for fall due to : - - Impaired balance/gait;Impaired mobility  Follow up - - Education provided;Falls prevention discussed   PHQ 2/9 Scores 07/08/2019 10/12/2018 10/08/2018 10/01/2018 06/04/2018 06/03/2018 04/01/2018  PHQ - 2 Score 0 _0 0 0  PHQ- 9 Score - 24 - - - - -    Assessment: Patient continues to manage chronic conditions with assistance of family.     Plan:  [facility] CM Care Plan Problem Two     Most Recent Value  Care Plan Problem Two Knowlege deficit regarding managment of diabetes as evidenced by elevated A1C   Role Documenting the Problem Two Care Management Telephonic Coordinator  Care Plan for Problem Two Active  Interventions for Problem Two Long Term Goal  Reviewed with daughter patient blood sugars.  A1c last check was 7.5.  Discussed contining diet and medication regimen to control sugars.    [facility] Long Term Goal Member's A1C will decrease </= 8 within the next 3 months  [facility] Long Term Goal Start Date 07/08/19     RN CM will contact again in the month of September and daughter agreeable.    Jone Baseman, RN, MSN [place] [facility] Care Management Coordinator Direct Line [phone] Cell [phone] Toll Free: [phone]  Fax: [phone]

## 2019-07-09 ENCOUNTER — Ambulatory Visit: Payer: Self-pay

## 2019-07-14 ENCOUNTER — Inpatient Hospital Stay: Payer: Medicare HMO

## 2019-07-14 ENCOUNTER — Other Ambulatory Visit: Payer: Self-pay

## 2019-07-14 DIAGNOSIS — D631 Anemia in chronic kidney disease: Secondary | ICD-10-CM | POA: Diagnosis not present

## 2019-07-14 DIAGNOSIS — D638 Anemia in other chronic diseases classified elsewhere: Secondary | ICD-10-CM

## 2019-07-14 DIAGNOSIS — I129 Hypertensive chronic kidney disease with stage 1 through stage 4 chronic kidney disease, or unspecified chronic kidney disease: Secondary | ICD-10-CM | POA: Diagnosis not present

## 2019-07-14 DIAGNOSIS — N183 Chronic kidney disease, stage 3 unspecified: Secondary | ICD-10-CM

## 2019-07-14 DIAGNOSIS — N189 Chronic kidney disease, unspecified: Secondary | ICD-10-CM | POA: Diagnosis not present

## 2019-07-14 LAB — HEMOGLOBIN: Hemoglobin: 11.2 g/dL — ABNORMAL LOW (ref 12.0–15.0)

## 2019-07-14 NOTE — Progress Notes (Signed)
Patient here today for reticrit injection. Patient HGB is 11.1 today no injection needed at this time. Copy of labs given to patient

## 2019-07-19 ENCOUNTER — Emergency Department (HOSPITAL_COMMUNITY)
Admission: EM | Admit: 2019-07-19 | Discharge: 2019-07-20 | Disposition: A | Discharge status: Home / Self Care | Payer: Medicare HMO | Attending: Emergency Medicine | Admitting: Emergency Medicine

## 2019-07-19 ENCOUNTER — Encounter (HOSPITAL_COMMUNITY): Payer: Self-pay

## 2019-07-19 DIAGNOSIS — E1165 Type 2 diabetes mellitus with hyperglycemia: Secondary | ICD-10-CM | POA: Diagnosis present

## 2019-07-19 DIAGNOSIS — Z5321 Procedure and treatment not carried out due to patient leaving prior to being seen by health care provider: Secondary | ICD-10-CM | POA: Diagnosis not present

## 2019-07-19 LAB — CBC
HCT: 35.4 % — ABNORMAL LOW (ref 36.0–46.0)
Hemoglobin: 11.1 g/dL — ABNORMAL LOW (ref 12.0–15.0)
MCH: 28.9 pg (ref 26.0–34.0)
MCHC: 31.4 g/dL (ref 30.0–36.0)
MCV: 92.2 fL (ref 80.0–100.0)
Platelets: 369 10*3/uL (ref 150–400)
RBC: 3.84 MIL/uL — ABNORMAL LOW (ref 3.87–5.11)
RDW: 16.5 % — ABNORMAL HIGH (ref 11.5–15.5)
WBC: 13.9 10*3/uL — ABNORMAL HIGH (ref 4.0–10.5)
nRBC: 0 % (ref 0.0–0.2)

## 2019-07-19 LAB — CBG MONITORING, ED: Glucose-Capillary: 324 mg/dL — ABNORMAL HIGH (ref 70–99)

## 2019-07-19 LAB — BASIC METABOLIC PANEL
Anion gap: 13 (ref 5–15)
BUN: 46 mg/dL — ABNORMAL HIGH (ref 8–23)
CO2: 23 mmol/L (ref 22–32)
Calcium: 9.9 mg/dL (ref 8.9–10.3)
Chloride: 102 mmol/L (ref 98–111)
Creatinine, Ser: 2.45 mg/dL — ABNORMAL HIGH (ref 0.44–1.00)
GFR calc Af Amer: 22 mL/min — ABNORMAL LOW (ref >60)
GFR calc non Af Amer: 19 mL/min — ABNORMAL LOW (ref >60)
Glucose, Bld: 337 mg/dL — ABNORMAL HIGH (ref 70–99)
Potassium: 4.4 mmol/L (ref 3.5–5.1)
Sodium: 138 mmol/L (ref 135–145)

## 2019-07-19 NOTE — ED Triage Notes (Signed)
Pt arrives to ED w/ c/o hyperglycemia. Pt states that this morning her sugar was in the 30's. Pt called EMS and she ate some food and had some OJ and that improved. Later this evening and her sugar as in the 300's. Pt states she is unsure of whether or not she took her levemir today.

## 2019-07-20 DIAGNOSIS — Z5321 Procedure and treatment not carried out due to patient leaving prior to being seen by health care provider: Secondary | ICD-10-CM | POA: Diagnosis not present

## 2019-07-20 LAB — URINALYSIS, ROUTINE W REFLEX MICROSCOPIC
Bilirubin Urine: NEGATIVE
Glucose, UA: >=500 mg/dL — AB
Hgb urine dipstick: NEGATIVE
Ketones, ur: NEGATIVE mg/dL
Nitrite: NEGATIVE
Protein, ur: 100 mg/dL — AB
Specific Gravity, Urine: 1.015 (ref 1.005–1.030)
pH: 5 (ref 5.0–8.0)

## 2019-07-20 LAB — CBG MONITORING, ED: Glucose-Capillary: 272 mg/dL — ABNORMAL HIGH (ref 70–99)

## 2019-07-20 NOTE — ED Notes (Signed)
Pt calling daughter to come and get here. Pt doesn't want to wait any longer. Notified that she should wait but pt still wants to leave

## 2019-07-23 ENCOUNTER — Other Ambulatory Visit: Payer: Self-pay

## 2019-07-23 ENCOUNTER — Ambulatory Visit (INDEPENDENT_AMBULATORY_CARE_PROVIDER_SITE_OTHER): Payer: Medicare HMO | Admitting: Endocrinology

## 2019-07-23 ENCOUNTER — Encounter: Payer: Self-pay | Admitting: Endocrinology

## 2019-07-23 ENCOUNTER — Ambulatory Visit: Payer: Medicare HMO | Admitting: Podiatry

## 2019-07-23 VITALS — BP 110/60 | HR 70 | Ht 64.0 in | Wt 162.2 lb

## 2019-07-23 DIAGNOSIS — Z794 Long term (current) use of insulin: Secondary | ICD-10-CM | POA: Diagnosis not present

## 2019-07-23 DIAGNOSIS — E1121 Type 2 diabetes mellitus with diabetic nephropathy: Secondary | ICD-10-CM | POA: Diagnosis not present

## 2019-07-23 DIAGNOSIS — N183 Chronic kidney disease, stage 3 unspecified: Secondary | ICD-10-CM | POA: Diagnosis not present

## 2019-07-23 DIAGNOSIS — E1122 Type 2 diabetes mellitus with diabetic chronic kidney disease: Secondary | ICD-10-CM | POA: Diagnosis not present

## 2019-07-23 DIAGNOSIS — N1831 Chronic kidney disease, stage 3a: Secondary | ICD-10-CM

## 2019-07-23 LAB — GLUCOSE, POCT (MANUAL RESULT ENTRY)
POC Glucose: 74 mg/dl (ref 70–99)
POC Glucose: 99 mg/dl (ref 70–99)

## 2019-07-23 MED ORDER — INSULIN DETEMIR 100 UNIT/ML FLEXPEN: 80.0000 [IU] | PEN_INJECTOR | SUBCUTANEOUS | 3 refills | Status: DC

## 2019-07-23 NOTE — Patient Instructions (Addendum)
Please reduce the Levemir to 80 units each morning.  Today only, eat plenty, to avoid the sugar from being low in the morning.  check your blood sugar twice a day.  vary the time of day when you check, between before the 3 meals, and at bedtime.  also check if you have symptoms of your blood sugar being too high or too low.  please keep a record of the readings and bring it to your next appointment here (or you can bring the meter itself).  You can write it on any piece of paper.  please call us sooner if your blood sugar goes below 70, or if you have a lot of readings over 200. Please come back for a follow-up appointment in 2 weeks.

## 2019-07-23 NOTE — Progress Notes (Signed)
Pt presents for follow up with Dr. Loanne Drilling. Stumbled upon entering the exam room. Pt also slow to respond to questions being asked. Confirmed that she checked her CBG this morning with results of 56. States she also took a couple sips of OJ. Obtained CBG with results of 74. Provided pt with 1 bottle of Coke. Pt also brought a breakfast sandwich with her today. Asked if she could eat the sandwich. Encouraged pt to eat what she could but to try to consume the Coke that I offered. Verbalized acceptance and understanding. Will recheck CBG in 15 minutes per protocol. Dr. Loanne Drilling aware of pt symptoms and CBG. Entered room to further eval pt condition.

## 2019-07-23 NOTE — Progress Notes (Signed)
Subjective:    Patient ID: Courtney Goodman, female    DOB: 1945-06-09, 74 y.o.   MRN: [id number]  HPI Pt returns for f/u of diabetes mellitus: DM type: Insulin-requiring type 2 Dx'ed: 6384 Complications: renal insufficiency and CAD.  Therapy: insulin since 2009, and victoza. GDM: never DKA: never Severe hypoglycemia: 2012 and 2019.   Pancreatitis: never.  SDOH: ins declined tresiba.   Other: she is on qd insulin, due to h/o noncompliance.   Interval history: no cbg record, but states cbg varies from 50-300.   A few days ago, pt had another episode of severe hypoglycemia.  She went to ER.  Pt is alone here today (was brought by dtr [name] today).   Past Medical History:  Diagnosis Date  . Anemia of chronic disease   . Asthma   . Atherosclerotic heart disease of native coronary artery without angina pectoris   . CKD (chronic kidney disease)   . COPD (chronic obstructive pulmonary disease) ([place])   . DM (diabetes mellitus), type 2 with renal complications ([place])   . Dysrhythmia   . Elevated ferritin 01/03/2017  . GAD (generalized anxiety disorder)   . Hypertension   . Mixed hyperlipidemia due to type 2 diabetes mellitus ([place])   . Mixed incontinence    per medical records from [place]  . Osteopenia   . Personal history of noncompliance with medical treatment, presenting hazards to health   . Shortness of breath dyspnea   . Suicidal ideation   . TB (tuberculosis), treated    age 89  . Vitamin D deficiency     Past Surgical History:  Procedure Laterality Date  . ANKLE RECONSTRUCTION     right  . CARPAL TUNNEL RELEASE    . CORONARY ANGIOPLASTY      Social History   Socioeconomic History  . Marital status: Divorced    Spouse name: Not on file  . Number of children: 2  . Years of education: 56  . Highest education level: High school graduate  Occupational History  . Not on file  Tobacco Use  . Smoking status: Former Research scientist (life sciences)  . Smokeless tobacco: Never Used  Vaping Use   . Vaping Use: Never used  Substance and Sexual Activity  . Alcohol use: No  . Drug use: No  . Sexual activity: Never  Other Topics Concern  . Not on file  Social History Narrative  . Not on file   Social Determinants of Health   Financial Resource Strain: Medium Risk  . Difficulty of Paying Living Expenses: Somewhat hard  Food Insecurity: Food Insecurity Present  . Worried About Charity fundraiser in the Last Year: Sometimes true  . Ran Out of Food in the Last Year: Sometimes true  Transportation Needs: No Transportation Needs  . Lack of Transportation (Medical): No  . Lack of Transportation (Non-Medical): No  Physical Activity: Inactive  . Days of Exercise per Week: 0 days  . Minutes of Exercise per Session: 0 min  Stress: Stress Concern Present  . Feeling of Stress : Very much  Social Connections: Moderately Isolated  . Frequency of Communication with Friends and Family: More than three times a week  . Frequency of Social Gatherings with Friends and Family: More than three times a week  . Attends Religious Services: More than 4 times per year  . Active Member of Clubs or Organizations: No  . Attends Archivist Meetings: Never  . Marital Status: Divorced  Human resources officer Violence:  Not At Risk  . Fear of Current or Ex-Partner: No  . Emotionally Abused: No  . Physically Abused: No  . Sexually Abused: No    Current Outpatient Medications on File Prior to Visit  Medication Sig Dispense Refill  . albuterol (PROVENTIL HFA;VENTOLIN HFA) 108 (90 Base) MCG/ACT inhaler Inhale 2 puffs into the lungs every 6 (six) hours as needed for wheezing or shortness of breath. 1 Inhaler 0  . Alcohol Swabs (ALCOHOL PREP) PADS Use for testing 100 each 1  . amLODipine-benazepril (LOTREL) 5-20 MG capsule Take 1 capsule by mouth daily. 90 capsule 0  . aspirin EC 81 MG tablet Take 81 mg by mouth daily.     . Biotin 10000 MCG TABS Take 1 tablet by mouth daily.     . Blood Glucose  Monitoring Suppl (TRUE METRIX METER) w/Device KIT 1 each by Does not apply route 2 (two) times daily. Use to monitor glucose levels BID; E11.29 1 kit 0  . carvedilol (COREG) 6.25 MG tablet Take 1 tablet (6.25 mg total) by mouth 2 (two) times daily. 180 tablet 0  . Cholecalciferol 25 MCG (1000 UT) tablet Take 1 tablet by mouth daily. 90 tablet 0  . citalopram (CELEXA) 20 MG tablet Take 1 tablet (20 mg total) by mouth daily. (Patient taking differently: Take 30 mg by mouth daily. ) 90 tablet 1  . fenofibrate (TRICOR) 145 MG tablet Take 1 tablet by mouth daily. 90 tablet 0  . fluticasone (FLONASE) 50 MCG/ACT nasal spray Place 2 sprays into both nostrils daily. 48 g 0  . folic acid (FOLVITE) 1 MG tablet Take 1 tablet (1 mg total) by mouth daily. 90 tablet 0  . glucose blood (TRUE METRIX BLOOD GLUCOSE TEST) test strip 1 each by Other route 2 (two) times daily. use for testing 200 strip 2  . Insulin Pen Needle 31G X 5 MM MISC Inject 1 each into the skin 2 (two) times daily. Use to inject insulin twice daily; E11.29 100 each 1  . omeprazole (PRILOSEC) 40 MG capsule Take 1 capsule (40 mg total) by mouth daily. 90 capsule 0  . simvastatin (ZOCOR) 20 MG tablet Take 1 tablet by mouth every night. 90 tablet 0  . traZODone (DESYREL) 50 MG tablet Take 50 mg by mouth at bedtime.     Marland Kitchen VICTOZA 18 MG/3ML SOPN Inject 1.8 mg subcutaneously daily. 27 mL 1   No current facility-administered medications on file prior to visit.    No Known Allergies  Family History  Problem Relation Age of Onset  . Diabetes Mother     BP 110/60   Pulse 70   Ht _0  (1.626 m)   Wt 162 lb 3.2 oz (73.6 kg)   SpO2 98%   BMI 27.84 kg/m    Review of Systems She has lost 2 lbs since last ov.      Objective:   Physical Exam VITAL SIGNS:  See vs page GENERAL: no distress Pulses: dorsalis pedis intact bilat.   MSK: no deformity of the feet CV: no leg edema Skin:  no ulcer on the feet.  normal color and temp on the feet.  Neuro: sensation is intact to touch on the feet Ext: there is bilateral onychomycosis of the toenails.    Lab Results  Component Value Date   HGBA1C 7.5 (A) 06/21/2019       Assessment & Plan:  Insulin-requiring type 2 DM, with CAD.  Hypoglycemia, severe, recurrent.   Patient Instructions  Please reduce the Levemir to 80 units each morning.  Today only, eat plenty, to avoid the sugar from being low in the morning.  check your blood sugar twice a day.  vary the time of day when you check, between before the 3 meals, and at bedtime.  also check if you have symptoms of your blood sugar being too high or too low.  please keep a record of the readings and bring it to your next appointment here (or you can bring the meter itself).  You can write it on any piece of paper.  please call us sooner if your blood sugar goes below 70, or if you have a lot of readings over 200. Please come back for a follow-up appointment in 2 weeks.

## 2019-07-23 NOTE — Progress Notes (Signed)
Pt states she feels "much better". States she consumed half of her breakfast sandwich and drank about 1/4 of her Coke. CBG obtained with results of 99. Dr. Loanne Drilling made aware. Agreed pt may be discharged.

## 2019-07-28 ENCOUNTER — Inpatient Hospital Stay: Payer: Medicare HMO

## 2019-08-03 ENCOUNTER — Other Ambulatory Visit: Payer: Self-pay

## 2019-08-03 ENCOUNTER — Encounter: Payer: Self-pay | Admitting: Neurology

## 2019-08-03 ENCOUNTER — Ambulatory Visit (INDEPENDENT_AMBULATORY_CARE_PROVIDER_SITE_OTHER): Payer: Medicare HMO | Admitting: Neurology

## 2019-08-03 VITALS — BP 100/61 | HR 68 | Ht 64.0 in | Wt 160.0 lb

## 2019-08-03 DIAGNOSIS — R413 Other amnesia: Secondary | ICD-10-CM | POA: Diagnosis not present

## 2019-08-03 DIAGNOSIS — E162 Hypoglycemia, unspecified: Secondary | ICD-10-CM | POA: Diagnosis not present

## 2019-08-03 DIAGNOSIS — R2681 Unsteadiness on feet: Secondary | ICD-10-CM

## 2019-08-03 NOTE — Patient Instructions (Signed)
Good to meet you!  1. Schedule MRI brain without contrast  2. Schedule Neurocognitive testing  3. We will set up home physical therapy and home health nurse for you  4. Continue working with your diabetes doctor on regulating glucose levels  5. Follow-up in 6 months, call for any changes    RECOMMENDATIONS FOR ALL PATIENTS WITH MEMORY PROBLEMS: 1. Continue to exercise (Recommend 30 minutes of walking everyday, or 3 hours every week) 2. Increase social interactions - continue going to [place] and enjoy social gatherings with friends and family 3. Eat healthy, avoid fried foods and eat more fruits and vegetables 4. Maintain adequate blood pressure, blood sugar, and blood cholesterol level. Reducing the risk of stroke and cardiovascular disease also helps promoting better memory. 5. Avoid stressful situations. Live a simple life and avoid aggravations. Organize your time and prepare for the next day in anticipation. 6. Sleep well, avoid any interruptions of sleep and avoid any distractions in the bedroom that may interfere with adequate sleep quality 7. Avoid sugar, avoid sweets as there is a strong link between excessive sugar intake, diabetes, and cognitive impairment The Mediterranean diet has been shown to help patients reduce the risk of progressive memory disorders and reduces cardiovascular risk. This includes eating fish, eat fruits and green leafy vegetables, nuts like almonds and hazelnuts, walnuts, and also use olive oil. Avoid fast foods and fried foods as much as possible. Avoid sweets and sugar as sugar use has been linked to worsening of memory function.

## 2019-08-03 NOTE — Progress Notes (Signed)
NEUROLOGY CONSULTATION NOTE  Courtney Goodman MRN: [id number] DOB: Apr 27, 1945  Referring provider: [name], NP-C Primary care provider: [name], NP-C  Reason for consult:  Medication noncompliance due to cognitive impairment; abnormal MMSE   Thank you for your kind referral of Courtney Goodman for consultation of the above symptoms. Although her history is well known to you, please allow me to reiterate it for the purpose of our medical record.She is alone in the office today. I spoke to her daughter Courtney Goodman separately to obtain additional information. Records and images were personally reviewed where available.   HISTORY OF PRESENT ILLNESS: This is a 74 year old right-handed woman with a history of hypertension, hyperlipidemia, diabetes, presenting for evaluation of cognitive impairment. She comes to the office this morning stating she woke up very confused, she is wearing mis-matched shoes, wearing a shoe on the right foot and a sandal on the left foot. She states she has not been feeling well this past week, her sugars have been acting up and insulin dose was adjusted a week ago. She was given ginger ale on arrival since she was unsteady, glucose reading after ginger ale was 81, which she states is still low for her. She states she is very confused and cannot remember things when her glucose levels are low. She feels good in the afternoons when her glucose levels are in the 150s. She states she does not eat enough when she goes to sleep at night, so her glucose levels drop and she wakes up hypoglycemic and confused. One time she was very confused, glucose level was in the 30s, she was brought to the ER and level was up by then. She states she has lost her appetite, she does not forget to eat but feels sick so she does not eat. She does not think she forgets her medications, she denies any missed bill payments. I spoke to her daughter Courtney Goodman on the phone to obtain additional  information. Courtney Goodman reports that memory changes only started 6-7 months ago when her insulin dose was changed. Courtney Goodman was not noticing any memory concerns prior to this, she notes when the insulin dose was increased or decreased, her mother would get discombobulated, some mornings she wakes up not knowing what is going on and her glucose level would be either low or high. She lives alone, Courtney Goodman agrees that she is compliant with her medications and knows how she should take her insulin if her level is low. Her other daughter comes daily to check on her, Courtney Goodman comes once a week and calls daily. Courtney Goodman states she is fine with cooking, when her glucose level is up/down, she may forget and left the stove on one time. She reports going to the grocery and having to ask her daughter for her PIN number because she could not remember. Courtney Goodman states she has not been driving for 1.5 years, Courtney Goodman denies getting lost, however the patient reports that she stopped driving because she would have spells where she ends up somewhere and would not remember her she got there, usually when her glucose levels were off. Courtney Goodman reports bathing has gotten a little sketchy because she if afraid of falling in the shower. I discussed the patients state of dress today (wearing mismatched shoes), Courtney Goodman states this is unusual, she normally dresses fine but has a hard time with early morning appointments because she has not yet eaten so her glucose levels are low.She has a bedside commode and wears  adult pads for urinary incontinence. Courtney Goodman feels she needs assistance with showering/dressing, bu the patient has been turning down assistance.   She reports frontal headaches 1-2 times a week, she thinks this is stress-related, she cannot remember things. She denies any diplopia, dysarthria/dysphagia, neck/back pain, focal numbness/tingling, bowel dysfunction, anosmia, or tremors. Sleep is okay with Trazodone. She feels off balance, she has a walker/cane at home but  does not use it. She reports falling out of bed this week, she was asleep and turned and fell off, waking up on the floor. Courtney Goodman denies any personality changes, "she is a Retail buyer," but no hallucinations or paranoia. No family history of dementia, no history of significant head injuries or alcohol use.   Her last brain imaging on [facility] was in 2012, with no acute changes seen. There was note of a small punctate linear shaped area of low-attenuation that projects within the medial of caudate nucleus on the right, a second more vague area projects just lateral to this, likely representing sequela or chronic and subacute areas of lacunar infarct. There was a small punctate area of calcification along the periphery of the left occipital region, likely a small calcified meningioma  Laboratory Data: Lab Results  Component Value Date   TSH 3.650 05/07/2017   Lab Results  Component Value Date   VITAMINB12 254 06/25/2017   Lab Results  Component Value Date   HGBA1C 7.5 (A) 06/21/2019     PAST MEDICAL HISTORY: Past Medical History:  Diagnosis Date  . Anemia of chronic disease   . Asthma   . Atherosclerotic heart disease of native coronary artery without angina pectoris   . CKD (chronic kidney disease)   . COPD (chronic obstructive pulmonary disease) ([place])   . DM (diabetes mellitus), type 2 with renal complications ([place])   . Dysrhythmia   . Elevated ferritin 01/03/2017  . GAD (generalized anxiety disorder)   . Hypertension   . Mixed hyperlipidemia due to type 2 diabetes mellitus ([place])   . Mixed incontinence    per medical records from [facility] [facility]  . Osteopenia   . Personal history of noncompliance with medical treatment, presenting hazards to health   . Shortness of breath dyspnea   . Suicidal ideation   . TB (tuberculosis), treated    age 34  . Vitamin D deficiency     PAST SURGICAL HISTORY: Past Surgical History:  Procedure Laterality Date  . ANKLE RECONSTRUCTION     right  . CARPAL  TUNNEL RELEASE    . CORONARY ANGIOPLASTY      MEDICATIONS: Current Outpatient Medications on File Prior to Visit  Medication Sig Dispense Refill  . albuterol (PROVENTIL HFA;VENTOLIN HFA) 108 (90 Base) MCG/ACT inhaler Inhale 2 puffs into the lungs every 6 (six) hours as needed for wheezing or shortness of breath. 1 Inhaler 0  . Alcohol Swabs (ALCOHOL PREP) PADS Use for testing 100 each 1  . amLODipine-benazepril (LOTREL) 5-20 MG capsule Take 1 capsule by mouth daily. 90 capsule 0  . aspirin EC 81 MG tablet Take 81 mg by mouth daily.     . Biotin 10000 MCG TABS Take 1 tablet by mouth daily.     . Blood Glucose Monitoring Suppl (TRUE METRIX METER) w/Device KIT 1 each by Does not apply route 2 (two) times daily. Use to monitor glucose levels BID; E11.29 1 kit 0  . carvedilol (COREG) 6.25 MG tablet Take 1 tablet (6.25 mg total) by mouth 2 (two) times daily. 180 tablet 0  .  citalopram (CELEXA) 20 MG tablet Take 1 tablet (20 mg total) by mouth daily. (Patient taking differently: Take 30 mg by mouth daily. ) 90 tablet 1  . fenofibrate (TRICOR) 145 MG tablet Take 1 tablet by mouth daily. 90 tablet 0  . fluticasone (FLONASE) 50 MCG/ACT nasal spray Place 2 sprays into both nostrils daily. 48 g 0  . folic acid (FOLVITE) 1 MG tablet Take 1 tablet (1 mg total) by mouth daily. 90 tablet 0  . glucose blood (TRUE METRIX BLOOD GLUCOSE TEST) test strip 1 each by Other route 2 (two) times daily. use for testing 200 strip 2  . insulin detemir (LEVEMIR) 100 UNIT/ML FlexPen Inject 80 Units into the skin every morning. 45 pen 3  . Insulin Pen Needle 31G X 5 MM MISC Inject 1 each into the skin 2 (two) times daily. Use to inject insulin twice daily; E11.29 100 each 1  . omeprazole (PRILOSEC) 40 MG capsule Take 1 capsule (40 mg total) by mouth daily. 90 capsule 0  . simvastatin (ZOCOR) 20 MG tablet Take 1 tablet by mouth every night. 90 tablet 0  . traZODone (DESYREL) 50 MG tablet Take 50 mg by mouth at bedtime.     Marland Kitchen  VICTOZA 18 MG/3ML SOPN Inject 1.8 mg subcutaneously daily. 27 mL 1  . Cholecalciferol 25 MCG (1000 UT) tablet Take 1 tablet by mouth daily. 90 tablet 0   No current facility-administered medications on file prior to visit.    ALLERGIES: No Known Allergies  FAMILY HISTORY: Family History  Problem Relation Age of Onset  . Diabetes Mother     SOCIAL HISTORY: Social History   Socioeconomic History  . Marital status: Divorced    Spouse name: Not on file  . Number of children: 2  . Years of education: 27  . Highest education level: High school graduate  Occupational History  . Not on file  Tobacco Use  . Smoking status: Former Research scientist (life sciences)  . Smokeless tobacco: Never Used  Vaping Use  . Vaping Use: Never used  Substance and Sexual Activity  . Alcohol use: No  . Drug use: No  . Sexual activity: Never  Other Topics Concern  . Not on file  Social History Narrative   Lives alone    Left handed   Social Determinants of Health   Financial Resource Strain: Medium Risk  . Difficulty of Paying Living Expenses: Somewhat hard  Food Insecurity: Food Insecurity Present  . Worried About Charity fundraiser in the Last Year: Sometimes true  . Ran Out of Food in the Last Year: Sometimes true  Transportation Needs: No Transportation Needs  . Lack of Transportation (Medical): No  . Lack of Transportation (Non-Medical): No  Physical Activity: Inactive  . Days of Exercise per Week: 0 days  . Minutes of Exercise per Session: 0 min  Stress: Stress Concern Present  . Feeling of Stress : Very much  Social Connections: Moderately Isolated  . Frequency of Communication with Friends and Family: More than three times a week  . Frequency of Social Gatherings with Friends and Family: More than three times a week  . Attends Religious Services: More than 4 times per year  . Active Member of Clubs or Organizations: No  . Attends Archivist Meetings: Never  . Marital Status: Divorced    Human resources officer Violence: Not At Risk  . Fear of Current or Ex-Partner: No  . Emotionally Abused: No  . Physically Abused: No  .  Sexually Abused: No    PHYSICAL EXAM: Vitals:   08/03/19 0849  BP: 100/61  Pulse: 68  SpO2: 100%   General: No acute distress Head:  Normocephalic/atraumatic Skin/Extremities: No rash, no edema Neurological Exam: Mental status: alert and oriented to person, place, year is 1921. No dysarthria or aphasia, Fund of knowledge is appropriate.  Recent and remote memory are impaired.  Attention and concentration are reduced.  SLUMS score 12/30 [employer] Mental Exam 08/03/2019  Weekday Correct 1  Current year 0  What state are we in? 1  Amount spent 0  Amount left 0  # of Animals 0  5 objects recall 3  Number series 0  Hour markers 1  Time correct 0  Placed X in triangle correctly 1  Largest Figure 1  Name of female 2  Date back to work 0  Type of work 2  State she lived in 0  Total score 12   Cranial nerves: CN I: not tested CN II: pupils equal, round and reactive to light, visual fields intact CN III, IV, VI:  full range of motion, no nystagmus, no ptosis CN V: facial sensation intact CN VII: upper and lower face symmetric CN VIII: hearing intact to conversation Bulk & Tone: normal, no fasciculations. Motor: 5/5 throughout with no pronator drift. Sensation: intact to light touch, cold.  Romberg test negative Deep Tendon Reflexes: +2 throughout except for absent ankle jerks bilaterall Cerebellar: no incoordination on finger to nose testing Gait: slow and cautious, unsteady reporting weakness around the left knee, needs assistance with ambulation Tremor: none  IMPRESSION: This is a pleasant 74 year old right-handed woman with a history of hypertension, hyperlipidemia, diabetes, presenting for evaluation of cognitive impairment. She and her daughter both report that cognitive changes occur when her glucose levels are either low or  high. It appears she mostly has hypoglycemia, she felt confused waking up this morning, after ginger ale in our office, fingerstick was 81. Her SLUMS score was 12/30. Overall it appears she is managing complex tasks, she and her daughter deny any medication compliance issues, both stating it is her uncontrolled glucose levels causing symptoms.Continue follow-up with Endocrinology. She will be scheduled for an MRI brain without contrast and Neurocognitive testing to be done at the middle of the day when she feels better with her glucose levels. Her daughter reports she does not do well with early morning appointments. We discussed home and gait safety, her daughter wishes for more assistance at home, patient agreeable for home health to monitor medications/glucose issues and home PT for gait instability. Follow-up in 6 months, they know to call for any changes.   Thank you for allowing me to participate in the care of this patient. Please do not hesitate to call for any questions or concerns.   Ellouise Newer, M.D.  CC: [name], NP-C

## 2019-08-08 DIAGNOSIS — E7849 Other hyperlipidemia: Secondary | ICD-10-CM | POA: Diagnosis not present

## 2019-08-08 DIAGNOSIS — J449 Chronic obstructive pulmonary disease, unspecified: Secondary | ICD-10-CM | POA: Diagnosis not present

## 2019-08-08 DIAGNOSIS — I2510 Atherosclerotic heart disease of native coronary artery without angina pectoris: Secondary | ICD-10-CM | POA: Diagnosis not present

## 2019-08-08 DIAGNOSIS — E559 Vitamin D deficiency, unspecified: Secondary | ICD-10-CM | POA: Diagnosis not present

## 2019-08-08 DIAGNOSIS — I10 Essential (primary) hypertension: Secondary | ICD-10-CM | POA: Diagnosis not present

## 2019-08-08 DIAGNOSIS — N1831 Chronic kidney disease, stage 3a: Secondary | ICD-10-CM | POA: Diagnosis not present

## 2019-08-08 DIAGNOSIS — E1122 Type 2 diabetes mellitus with diabetic chronic kidney disease: Secondary | ICD-10-CM | POA: Diagnosis not present

## 2019-08-08 DIAGNOSIS — E1169 Type 2 diabetes mellitus with other specified complication: Secondary | ICD-10-CM | POA: Diagnosis not present

## 2019-08-08 DIAGNOSIS — G3184 Mild cognitive impairment, so stated: Secondary | ICD-10-CM | POA: Diagnosis not present

## 2019-08-09 ENCOUNTER — Telehealth: Payer: Self-pay

## 2019-08-09 ENCOUNTER — Ambulatory Visit: Payer: Medicare HMO | Admitting: Endocrinology

## 2019-08-09 DIAGNOSIS — I10 Essential (primary) hypertension: Secondary | ICD-10-CM | POA: Diagnosis not present

## 2019-08-09 DIAGNOSIS — E1169 Type 2 diabetes mellitus with other specified complication: Secondary | ICD-10-CM | POA: Diagnosis not present

## 2019-08-09 DIAGNOSIS — G3184 Mild cognitive impairment, so stated: Secondary | ICD-10-CM | POA: Diagnosis not present

## 2019-08-09 DIAGNOSIS — E7849 Other hyperlipidemia: Secondary | ICD-10-CM | POA: Diagnosis not present

## 2019-08-09 DIAGNOSIS — J449 Chronic obstructive pulmonary disease, unspecified: Secondary | ICD-10-CM | POA: Diagnosis not present

## 2019-08-09 DIAGNOSIS — E1122 Type 2 diabetes mellitus with diabetic chronic kidney disease: Secondary | ICD-10-CM | POA: Diagnosis not present

## 2019-08-09 DIAGNOSIS — I2510 Atherosclerotic heart disease of native coronary artery without angina pectoris: Secondary | ICD-10-CM | POA: Diagnosis not present

## 2019-08-09 DIAGNOSIS — E559 Vitamin D deficiency, unspecified: Secondary | ICD-10-CM | POA: Diagnosis not present

## 2019-08-09 DIAGNOSIS — N1831 Chronic kidney disease, stage 3a: Secondary | ICD-10-CM | POA: Diagnosis not present

## 2019-08-09 MED ORDER — AMLODIPINE BESY-BENAZEPRIL HCL 5-20 MG PO CAPS: 1.0000 | ORAL_CAPSULE | Freq: Every day | ORAL | 0 refills | Status: DC

## 2019-08-09 MED ORDER — OMEPRAZOLE 40 MG PO CPDR: 40.0000 mg | DELAYED_RELEASE_CAPSULE | Freq: Every day | ORAL | 0 refills | Status: DC

## 2019-08-09 MED ORDER — FOLIC ACID 1 MG PO TABS: 1.0000 mg | ORAL_TABLET | Freq: Every day | ORAL | 0 refills | Status: DC

## 2019-08-09 NOTE — Telephone Encounter (Signed)
Sent in meds 

## 2019-08-09 NOTE — Telephone Encounter (Signed)
Received fax from [facility] by [facility] for refills on the pts. Folic Acid, Omeprazole, and Amlodipine/benazepril. Pt. Last apt was 06/23/19 next apt is 09/01/19.

## 2019-08-10 ENCOUNTER — Emergency Department (HOSPITAL_COMMUNITY)
Admission: EM | Admit: 2019-08-10 | Discharge: 2019-08-10 | Disposition: A | Discharge status: Home / Self Care | Payer: Medicare HMO | Attending: Emergency Medicine | Admitting: Emergency Medicine

## 2019-08-10 ENCOUNTER — Encounter (HOSPITAL_COMMUNITY): Payer: Self-pay | Admitting: Emergency Medicine

## 2019-08-10 ENCOUNTER — Emergency Department (HOSPITAL_COMMUNITY): Payer: Medicare HMO

## 2019-08-10 ENCOUNTER — Other Ambulatory Visit: Payer: Self-pay

## 2019-08-10 DIAGNOSIS — G319 Degenerative disease of nervous system, unspecified: Secondary | ICD-10-CM | POA: Diagnosis not present

## 2019-08-10 DIAGNOSIS — Z87891 Personal history of nicotine dependence: Secondary | ICD-10-CM | POA: Insufficient documentation

## 2019-08-10 DIAGNOSIS — Z794 Long term (current) use of insulin: Secondary | ICD-10-CM | POA: Diagnosis not present

## 2019-08-10 DIAGNOSIS — J449 Chronic obstructive pulmonary disease, unspecified: Secondary | ICD-10-CM | POA: Diagnosis not present

## 2019-08-10 DIAGNOSIS — I129 Hypertensive chronic kidney disease with stage 1 through stage 4 chronic kidney disease, or unspecified chronic kidney disease: Secondary | ICD-10-CM | POA: Diagnosis not present

## 2019-08-10 DIAGNOSIS — E162 Hypoglycemia, unspecified: Secondary | ICD-10-CM

## 2019-08-10 DIAGNOSIS — G9340 Encephalopathy, unspecified: Secondary | ICD-10-CM | POA: Diagnosis not present

## 2019-08-10 DIAGNOSIS — E11649 Type 2 diabetes mellitus with hypoglycemia without coma: Secondary | ICD-10-CM | POA: Insufficient documentation

## 2019-08-10 DIAGNOSIS — Z79899 Other long term (current) drug therapy: Secondary | ICD-10-CM | POA: Insufficient documentation

## 2019-08-10 DIAGNOSIS — I10 Essential (primary) hypertension: Secondary | ICD-10-CM | POA: Diagnosis not present

## 2019-08-10 DIAGNOSIS — I6381 Other cerebral infarction due to occlusion or stenosis of small artery: Secondary | ICD-10-CM | POA: Diagnosis not present

## 2019-08-10 DIAGNOSIS — J90 Pleural effusion, not elsewhere classified: Secondary | ICD-10-CM | POA: Diagnosis not present

## 2019-08-10 DIAGNOSIS — N183 Chronic kidney disease, stage 3 unspecified: Secondary | ICD-10-CM | POA: Insufficient documentation

## 2019-08-10 DIAGNOSIS — I6782 Cerebral ischemia: Secondary | ICD-10-CM | POA: Diagnosis not present

## 2019-08-10 DIAGNOSIS — R4182 Altered mental status, unspecified: Secondary | ICD-10-CM | POA: Diagnosis present

## 2019-08-10 DIAGNOSIS — R404 Transient alteration of awareness: Secondary | ICD-10-CM

## 2019-08-10 DIAGNOSIS — R410 Disorientation, unspecified: Secondary | ICD-10-CM | POA: Diagnosis not present

## 2019-08-10 DIAGNOSIS — E161 Other hypoglycemia: Secondary | ICD-10-CM | POA: Diagnosis not present

## 2019-08-10 DIAGNOSIS — R0602 Shortness of breath: Secondary | ICD-10-CM | POA: Diagnosis not present

## 2019-08-10 LAB — CBC WITH DIFFERENTIAL/PLATELET
Abs Immature Granulocytes: 0.04 10*3/uL (ref 0.00–0.07)
Basophils Absolute: 0 10*3/uL (ref 0.0–0.1)
Basophils Relative: 0 %
Eosinophils Absolute: 0.1 10*3/uL (ref 0.0–0.5)
Eosinophils Relative: 1 %
HCT: 33.2 % — ABNORMAL LOW (ref 36.0–46.0)
Hemoglobin: 10.3 g/dL — ABNORMAL LOW (ref 12.0–15.0)
Immature Granulocytes: 1 %
Lymphocytes Relative: 16 %
Lymphs Abs: 1.3 10*3/uL (ref 0.7–4.0)
MCH: 28.3 pg (ref 26.0–34.0)
MCHC: 31 g/dL (ref 30.0–36.0)
MCV: 91.2 fL (ref 80.0–100.0)
Monocytes Absolute: 0.6 10*3/uL (ref 0.1–1.0)
Monocytes Relative: 6 %
Neutro Abs: 6.6 10*3/uL (ref 1.7–7.7)
Neutrophils Relative %: 76 %
Platelets: 334 10*3/uL (ref 150–400)
RBC: 3.64 MIL/uL — ABNORMAL LOW (ref 3.87–5.11)
RDW: 16.6 % — ABNORMAL HIGH (ref 11.5–15.5)
WBC: 8.7 10*3/uL (ref 4.0–10.5)
nRBC: 0 % (ref 0.0–0.2)

## 2019-08-10 LAB — COMPREHENSIVE METABOLIC PANEL
ALT: 13 U/L (ref 0–44)
AST: 21 U/L (ref 15–41)
Albumin: 3.6 g/dL (ref 3.5–5.0)
Alkaline Phosphatase: 40 U/L (ref 38–126)
Anion gap: 9 (ref 5–15)
BUN: 38 mg/dL — ABNORMAL HIGH (ref 8–23)
CO2: 22 mmol/L (ref 22–32)
Calcium: 9.6 mg/dL (ref 8.9–10.3)
Chloride: 109 mmol/L (ref 98–111)
Creatinine, Ser: 1.78 mg/dL — ABNORMAL HIGH (ref 0.44–1.00)
GFR calc Af Amer: 32 mL/min — ABNORMAL LOW (ref >60)
GFR calc non Af Amer: 28 mL/min — ABNORMAL LOW (ref >60)
Glucose, Bld: 96 mg/dL (ref 70–99)
Potassium: 4.4 mmol/L (ref 3.5–5.1)
Sodium: 140 mmol/L (ref 135–145)
Total Bilirubin: 0.7 mg/dL (ref 0.3–1.2)
Total Protein: 7.5 g/dL (ref 6.5–8.1)

## 2019-08-10 LAB — URINALYSIS, ROUTINE W REFLEX MICROSCOPIC
Bilirubin Urine: NEGATIVE
Glucose, UA: NEGATIVE mg/dL
Hgb urine dipstick: NEGATIVE
Ketones, ur: NEGATIVE mg/dL
Nitrite: NEGATIVE
Protein, ur: NEGATIVE mg/dL
Specific Gravity, Urine: 1.013 (ref 1.005–1.030)
pH: 5 (ref 5.0–8.0)

## 2019-08-10 LAB — CBG MONITORING, ED
Glucose-Capillary: 120 mg/dL — ABNORMAL HIGH (ref 70–99)
Glucose-Capillary: 115 mg/dL — ABNORMAL HIGH (ref 70–99)

## 2019-08-10 NOTE — ED Provider Notes (Signed)
[facility] Provider Note   CSN: [id number] Arrival date & time: 08/10/19  1112     History Chief Complaint  Patient presents with  . Altered Mental Status    [facility] is a 74 y.o. female.  74 year old female with prior medical history detailed below presents for evaluation of confusion.  Patient reports that she awoke this morning and felt confused.  EMS was called to her home for evaluation.  EMS reports that her blood sugar was 50.  This was treated prior to arrival.  Upon arrival to the ED the patient is reporting that she feels improved.  She is without complaints of associated visual change, focal weakness or numbness, chest pain, shortness of breath, fever, or other specific complaint.  She does report that she has had transient episodes of hypoglycemia before.  These are usually associated with inadequate food intake with concurrent use of insulin.  She reports that she may not have eaten a very good meal for dinner last night.  The history is provided by the patient and the EMS personnel.  Altered Mental Status Presenting symptoms: confusion   Severity:  Mild Most recent episode:  Today Episode history:  Single Duration:  1 hour Timing:  Intermittent Progression:  Waxing and waning Chronicity:  Recurrent      Past Medical History:  Diagnosis Date  . Anemia of chronic disease   . Asthma   . Atherosclerotic heart disease of native coronary artery without angina pectoris   . CKD (chronic kidney disease)   . COPD (chronic obstructive pulmonary disease) ([place])   . DM (diabetes mellitus), type 2 with renal complications ([place])   . Dysrhythmia   . Elevated ferritin 01/03/2017  . GAD (generalized anxiety disorder)   . Hypertension   . Mixed hyperlipidemia due to type 2 diabetes mellitus ([place])   . Mixed incontinence    per medical records from [place]  . Osteopenia   . Personal history of noncompliance with medical treatment,  presenting hazards to health   . Shortness of breath dyspnea   . Suicidal ideation   . TB (tuberculosis), treated    age 5  . Vitamin D deficiency     Patient Active Problem List   Diagnosis Date Noted  . Medication noncompliance due to cognitive impairment 10/12/2018  . Severe episode of recurrent major depressive disorder, without psychotic features ([place]) 10/12/2018  . Acute renal failure superimposed on stage 3 chronic kidney disease ([place]) 09/22/2018  . Leukocytosis 09/22/2018  . Non-intractable vomiting   . GERD (gastroesophageal reflux disease) 08/23/2017  . CAD (coronary artery disease) 08/23/2017  . Hypoglycemia 08/23/2017  . Acute metabolic encephalopathy 02/58/5277  . Hypokalemia 08/23/2017  . Type II diabetes mellitus with renal manifestations ([place]) 08/23/2017  . Elevated ferritin 01/03/2017  . Personal history of noncompliance with medical treatment, presenting hazards to health   . Urinary incontinence, mixed 05/27/2016  . Major depression, recurrent ([place]) 05/27/2016  . Atherosclerotic heart disease of native coronary artery without angina pectoris 05/27/2016  . GAD (generalized anxiety disorder)   . Anemia of chronic disease   . Mixed hyperlipidemia due to type 2 diabetes mellitus ([place])   . Diabetes ([place]) 11/30/2015  . Vitamin D deficiency 11/23/2015  . HTN (hypertension) 11/23/2015  . Anemia 11/23/2015  . CKD (chronic kidney disease), stage III 11/23/2015  . Dysrhythmia   . COPD (chronic obstructive pulmonary disease) ([place])     Past Surgical History:  Procedure Laterality Date  .  ANKLE RECONSTRUCTION     right  . CARPAL TUNNEL RELEASE    . CORONARY ANGIOPLASTY       OB History   No obstetric history on file.     Family History  Problem Relation Age of Onset  . Diabetes Mother     Social History   Tobacco Use  . Smoking status: Former Research scientist (life sciences)  . Smokeless tobacco: Never Used  Vaping Use  . Vaping Use: Never used  Substance Use Topics  .  Alcohol use: No  . Drug use: No    Home Medications Prior to Admission medications   Medication Sig Start Date End Date Taking? Authorizing Provider  Alcohol Swabs (ALCOHOL PREP) PADS Use for testing Patient taking differently: 1 each by Other route See admin instructions. Use for testing 08/07/18  Yes Henson, Vickie L, NP-C  albuterol (PROVENTIL HFA;VENTOLIN HFA) 108 (90 Base) MCG/ACT inhaler Inhale 2 puffs into the lungs every 6 (six) hours as needed for wheezing or shortness of breath. 04/14/17   Henson, Vickie L, NP-C  amLODipine-benazepril (LOTREL) 5-20 MG capsule Take 1 capsule by mouth daily. 08/09/19   Henson, Vickie L, NP-C  aspirin EC 81 MG tablet Take 81 mg by mouth daily.     [provider]  Biotin 10000 MCG TABS Take 1 tablet by mouth daily.     [provider]  Blood Glucose Monitoring Suppl (TRUE METRIX METER) w/Device KIT 1 each by Does not apply route 2 (two) times daily. Use to monitor glucose levels BID; E11.29 Patient taking differently: 1 each by Does not apply route See admin instructions. Use to monitor glucose levels BID; E11.29 10/16/18   Renato Shin, MD  carvedilol (COREG) 6.25 MG tablet Take 1 tablet (6.25 mg total) by mouth 2 (two) times daily. 06/23/19   Henson, Laurian Brim, NP-C  Cholecalciferol 25 MCG (1000 UT) tablet Take 1 tablet by mouth daily. Patient taking differently: Take 1,000 Units by mouth daily.  04/20/19   Henson, Vickie L, NP-C  citalopram (CELEXA) 20 MG tablet Take 1 tablet (20 mg total) by mouth daily. Patient taking differently: Take 30 mg by mouth daily.  09/05/16   Henson, Vickie L, NP-C  fenofibrate (TRICOR) 145 MG tablet Take 1 tablet by mouth daily. Patient taking differently: Take 145 mg by mouth daily.  05/20/19   Henson, Vickie L, NP-C  fluticasone (FLONASE) 50 MCG/ACT nasal spray Place 2 sprays into both nostrils daily. 07/25/16   Henson, Vickie L, NP-C  folic acid (FOLVITE) 1 MG tablet Take 1 tablet (1 mg total) by mouth daily.  08/09/19   Henson, Vickie L, NP-C  glucose blood (TRUE METRIX BLOOD GLUCOSE TEST) test strip 1 each by Other route 2 (two) times daily. use for testing 12/22/18   Renato Shin, MD  insulin detemir (LEVEMIR) 100 UNIT/ML FlexPen Inject 80 Units into the skin every morning. 07/23/19   Renato Shin, MD  Insulin Pen Needle 31G X 5 MM MISC Inject 1 each into the skin 2 (two) times daily. Use to inject insulin twice daily; E11.29 03/05/19   Renato Shin, MD  omeprazole (PRILOSEC) 40 MG capsule Take 1 capsule (40 mg total) by mouth daily. 08/09/19   Henson, Vickie L, NP-C  simvastatin (ZOCOR) 20 MG tablet Take 1 tablet by mouth every night. Patient taking differently: Take 20 mg by mouth daily at 6 PM.  05/20/19   Henson, Vickie L, NP-C  traZODone (DESYREL) 50 MG tablet Take 50 mg by mouth at bedtime.  [provider]  VICTOZA 18 MG/3ML SOPN Inject 1.8 mg subcutaneously daily. Patient taking differently: Inject 1.8 mg into the skin daily.  02/22/19   Renato Shin, MD    Allergies    Patient has no known allergies.  Review of Systems   Review of Systems  Psychiatric/Behavioral: Positive for confusion.  All other systems reviewed and are negative.   Physical Exam Updated Vital Signs BP 136/64   Pulse 73   Temp 98.4 F (36.9 C) (Oral)   Resp (!) 21   Ht 5' 4"  (1.626 m)   Wt 72 kg   SpO2 99%   BMI 27.25 kg/m   Physical Exam Vitals and nursing note reviewed.  Constitutional:      General: She is not in acute distress.    Appearance: Normal appearance. She is well-developed.  HENT:     Head: Normocephalic and atraumatic.  Eyes:     Conjunctiva/sclera: Conjunctivae normal.     Pupils: Pupils are equal, round, and reactive to light.  Cardiovascular:     Rate and Rhythm: Normal rate and regular rhythm.     Heart sounds: Normal heart sounds.  Pulmonary:     Effort: Pulmonary effort is normal. No respiratory distress.     Breath sounds: Normal breath sounds.  Abdominal:      General: There is no distension.     Palpations: Abdomen is soft.     Tenderness: There is no abdominal tenderness.  Musculoskeletal:        General: No deformity. Normal range of motion.     Cervical back: Normal range of motion and neck supple.  Skin:    General: Skin is warm and dry.  Neurological:     Mental Status: She is alert and oriented to person, place, and time.     ED Results / Procedures / Treatments   Labs (all labs ordered are listed, but only abnormal results are displayed) Labs Reviewed  COMPREHENSIVE METABOLIC PANEL - Abnormal; Notable for the following components:      Result Value   BUN 38 (*)    Creatinine, Ser 1.78 (*)    GFR calc non Af Amer 28 (*)    GFR calc Af Amer 32 (*)    All other components within normal limits  CBC WITH DIFFERENTIAL/PLATELET - Abnormal; Notable for the following components:   RBC 3.64 (*)    Hemoglobin 10.3 (*)    HCT 33.2 (*)    RDW 16.6 (*)    All other components within normal limits  CBG MONITORING, ED - Abnormal; Notable for the following components:   Glucose-Capillary 120 (*)    All other components within normal limits  URINALYSIS, ROUTINE W REFLEX MICROSCOPIC  CBG MONITORING, ED    EKG EKG Interpretation  Date/Time:  Tuesday August 10 2019 11:25:38 EDT Ventricular Rate:  72 PR Interval:    QRS Duration: 136 QT Interval:  428 QTC Calculation: 469 R Axis:   60 Text Interpretation: Sinus rhythm Right bundle branch block Baseline wander in lead(s) aVF V4 Confirmed by Dene Gentry [phone]) on 08/10/2019 12:45:44 PM   Radiology CT Head Wo Contrast  Result Date: 08/10/2019 CLINICAL DATA:  Encephalopathy.  Additional provided: Confusion. EXAM: CT HEAD WITHOUT CONTRAST TECHNIQUE: Contiguous axial images were obtained from the base of the skull through the vertex without intravenous contrast. COMPARISON:  Head CT 05/29/2010 FINDINGS: Brain: Mild generalized parenchymal atrophy. Mild ill-defined hypoattenuation within  the cerebral white matter is nonspecific, but consistent with  chronic small vessel ischemic disease. Redemonstrated small chronic infarct within the right caudate head. There is no acute intracranial hemorrhage. No demarcated cortical infarct is identified. No extra-axial fluid collection. No evidence of intracranial mass. No midline shift. Vascular: No hyperdense vessel.  Atherosclerotic calcifications Skull: Normal. Negative for fracture or focal lesion. Sinuses/Orbits: Visualized orbits show no acute finding. Mild ethmoid sinus mucosal thickening. Moderate right maxillary sinus mucosal thickening. No significant mastoid effusion. IMPRESSION: No CT evidence of acute intracranial abnormality. Mild generalized parenchymal atrophy and chronic small vessel ischemic disease. Redemonstrated small chronic right basal ganglia lacunar infarct. Paranasal sinus mucosal thickening as described, most notably right maxillary. Electronically Signed   By: Kellie Simmering DO   On: 08/10/2019 12:32   DG Chest Port 1 View  Result Date: 08/10/2019 CLINICAL DATA:  Shortness of breath. EXAM: PORTABLE CHEST 1 VIEW COMPARISON:  August 25, 2018. FINDINGS: The heart size and mediastinal contours are within normal limits. Stable left upper lobe scarring. No acute abnormality is noted. No pneumothorax or pleural effusion is noted. The visualized skeletal structures are unremarkable. IMPRESSION: No active disease. Electronically Signed   By: Marijo Conception M.D.   On: 08/10/2019 13:03    Procedures Procedures (including critical care time)  Medications Ordered in ED Medications - No data to display  ED Course  I have reviewed the triage vital signs and the nursing notes.  Pertinent labs & imaging results that were available during my care of the patient were reviewed by me and considered in my medical decision making (see chart for details).    MDM Rules/Calculators/A&P                          MDM  Screen complete  Caysie Minnifield Budden was evaluated in Emergency Department on 08/10/2019 for the symptoms described in the history of present illness. She was evaluated in the context of the global COVID-19 pandemic, which necessitated consideration that the patient might be at risk for infection with the SARS-CoV-2 virus that causes COVID-19. Institutional protocols and algorithms that pertain to the evaluation of patients at risk for COVID-19 are in a state of rapid change based on information released by regulatory bodies including the CDC and federal and state organizations. These policies and algorithms were followed during the patient's care in the ED.  Patient is presenting for evaluation of transient confusion.  Given patient's hypoglycemia found in the field with EMS suspect that this is the cause of her confusion.  EMS treated her hypoxemia prior to arrival.  Her confusion has resolved.  Work-up performed in ED is otherwise without significant abnormality.  Patient appears to be appropriate for discharge.  She does understand the need to take good meals prior to insulin usage.  1600 UA is pending. Dr. Eulis Foster aware of pending UA and plan to likely DC home.  Final Clinical Impression(s) / ED Diagnoses Final diagnoses:  Hypoglycemia    Rx / DC Orders ED Discharge Orders    None       Valarie Merino, MD 08/10/19 1546

## 2019-08-10 NOTE — ED Triage Notes (Signed)
Pt BIB [facility] from home. Pt complaining of confusion since Christmas. Pt cbg 50 upon EMS arrival. Pt given orange juice, CBG up to 71. Pt with history of anemia.

## 2019-08-10 NOTE — ED Notes (Signed)
Pt verbalized understanding of discharge instructions. Follow up care reviewed, pt had no further questions. 

## 2019-08-10 NOTE — Discharge Instructions (Signed)
Please return for any problem.  Follow-up with your regular care provider as instructed. °

## 2019-08-10 NOTE — ED Provider Notes (Signed)
4:20 PM-checkout to evaluate urinalysis prior to disposition, urine ordered to evaluate for possible cause of altered mental status.  She was hypoglycemic, 50, when EMS arrived to her home.  The patient was treated with orange juice, by EMS with improvement of her CBG to 71.  It is suspected that this was the cause of her altered mental status.  She is insulin-dependent diabetic.  Last assessment of glucose was at 1251 today, when it was 96.  5:30 PM-at this time the patient is alert, and in the room with her daughter.  Patient reports that she had a physical therapist come to her home yesterday to assess her, for difficulty walking.  They had planned to come back today for further evaluation.  This was initiated by her PCP.  Patient describes difficulty remembering things, such as when her doctors appointments are.  She is also "scared to "because I am worried about setting the house on fire."  Patient's daughter states that the patient has food available, but tends to avoid eating for unclear reasons.  The patient is amenable to increased home health services to improve her ability to live on her own.  Patient's daughter in the room does seem supportive and acts appropriately.  We will have the patient evaluated by transitions of care team.  7:52 PM-per TOC, they will have to contact [employer] and, to initiate home health services, tomorrow.  CBG at 115.  Medical decision making: Patient presenting with confusion, and hypoglycemia.  She improved with symptomatic treatment, and has been comprehensively evaluated.  Doubt UTI, metabolic instability or impending vascular collapse.  Patient's P CP has initiated home health physical therapy.  I have written orders to advance services to comprehensive in-home assessment and treatment, with RN, aide, speech-language therapy; to improve her functional status at home.  Patient is agreeable to this.   Daleen Bo, MD 08/10/19 2046

## 2019-08-10 NOTE — ED Notes (Signed)
Dinner Tray Ordered @1858. °

## 2019-08-10 NOTE — Care Management (Addendum)
ED CM spoke with the patient at the bedside. Patient states she has home health services which restarted yesterday. Reports she worked with PT yesterday. She cannot recall the name of the home health agency. She lives alone but reports her grandson checks on her every day and her granddaughter is planning to move from [place] to [place]. Her granddaughter has told the patient she can move in with her after she relocates. Patient states she has 2 daughters in [place] but she has not discussed moving in with them. She states she does not want to move into a LTC facility. She reports having difficulty understanding what to do when she becomes hyper or hypoglycemic. She has a wearable emergency alert device and a device in the home she can use to verbally request EMS assistance. She has a walker. She states she does not want to eat sometimes due to issues with her taste. Reports a positive relationship with her PCP. She will see a neurologist soon and will have a scan of her brain.   Patient is active with [facility] and will be referred to [facility] for follow-up.

## 2019-08-11 ENCOUNTER — Inpatient Hospital Stay: Payer: Medicare HMO | Attending: Oncology | Admitting: Internal Medicine

## 2019-08-11 ENCOUNTER — Other Ambulatory Visit: Payer: Self-pay | Admitting: Family Medicine

## 2019-08-11 ENCOUNTER — Inpatient Hospital Stay: Payer: Medicare HMO

## 2019-08-11 DIAGNOSIS — I6782 Cerebral ischemia: Secondary | ICD-10-CM | POA: Insufficient documentation

## 2019-08-11 DIAGNOSIS — E559 Vitamin D deficiency, unspecified: Secondary | ICD-10-CM | POA: Insufficient documentation

## 2019-08-11 DIAGNOSIS — Z7982 Long term (current) use of aspirin: Secondary | ICD-10-CM | POA: Insufficient documentation

## 2019-08-11 DIAGNOSIS — E1122 Type 2 diabetes mellitus with diabetic chronic kidney disease: Secondary | ICD-10-CM | POA: Insufficient documentation

## 2019-08-11 DIAGNOSIS — M8580 Other specified disorders of bone density and structure, unspecified site: Secondary | ICD-10-CM | POA: Insufficient documentation

## 2019-08-11 DIAGNOSIS — Z794 Long term (current) use of insulin: Secondary | ICD-10-CM | POA: Insufficient documentation

## 2019-08-11 DIAGNOSIS — G9340 Encephalopathy, unspecified: Secondary | ICD-10-CM | POA: Insufficient documentation

## 2019-08-11 DIAGNOSIS — R0602 Shortness of breath: Secondary | ICD-10-CM | POA: Insufficient documentation

## 2019-08-11 DIAGNOSIS — I6381 Other cerebral infarction due to occlusion or stenosis of small artery: Secondary | ICD-10-CM | POA: Insufficient documentation

## 2019-08-11 DIAGNOSIS — N189 Chronic kidney disease, unspecified: Secondary | ICD-10-CM | POA: Insufficient documentation

## 2019-08-11 DIAGNOSIS — Z79899 Other long term (current) drug therapy: Secondary | ICD-10-CM | POA: Insufficient documentation

## 2019-08-11 DIAGNOSIS — E782 Mixed hyperlipidemia: Secondary | ICD-10-CM | POA: Insufficient documentation

## 2019-08-11 DIAGNOSIS — I2510 Atherosclerotic heart disease of native coronary artery without angina pectoris: Secondary | ICD-10-CM | POA: Insufficient documentation

## 2019-08-11 DIAGNOSIS — J449 Chronic obstructive pulmonary disease, unspecified: Secondary | ICD-10-CM | POA: Insufficient documentation

## 2019-08-11 DIAGNOSIS — D631 Anemia in chronic kidney disease: Secondary | ICD-10-CM | POA: Insufficient documentation

## 2019-08-11 DIAGNOSIS — I129 Hypertensive chronic kidney disease with stage 1 through stage 4 chronic kidney disease, or unspecified chronic kidney disease: Secondary | ICD-10-CM | POA: Insufficient documentation

## 2019-08-12 ENCOUNTER — Other Ambulatory Visit: Payer: Medicare HMO

## 2019-08-12 ENCOUNTER — Other Ambulatory Visit: Payer: Self-pay | Admitting: Endocrinology

## 2019-08-12 DIAGNOSIS — E7849 Other hyperlipidemia: Secondary | ICD-10-CM | POA: Diagnosis not present

## 2019-08-12 DIAGNOSIS — I2510 Atherosclerotic heart disease of native coronary artery without angina pectoris: Secondary | ICD-10-CM | POA: Diagnosis not present

## 2019-08-12 DIAGNOSIS — N183 Chronic kidney disease, stage 3 unspecified: Secondary | ICD-10-CM

## 2019-08-12 DIAGNOSIS — E1122 Type 2 diabetes mellitus with diabetic chronic kidney disease: Secondary | ICD-10-CM | POA: Diagnosis not present

## 2019-08-12 DIAGNOSIS — I10 Essential (primary) hypertension: Secondary | ICD-10-CM | POA: Diagnosis not present

## 2019-08-12 DIAGNOSIS — G3184 Mild cognitive impairment, so stated: Secondary | ICD-10-CM | POA: Diagnosis not present

## 2019-08-12 DIAGNOSIS — N1831 Chronic kidney disease, stage 3a: Secondary | ICD-10-CM | POA: Diagnosis not present

## 2019-08-12 DIAGNOSIS — E11649 Type 2 diabetes mellitus with hypoglycemia without coma: Secondary | ICD-10-CM | POA: Diagnosis not present

## 2019-08-12 DIAGNOSIS — E559 Vitamin D deficiency, unspecified: Secondary | ICD-10-CM | POA: Diagnosis not present

## 2019-08-12 DIAGNOSIS — Z794 Long term (current) use of insulin: Secondary | ICD-10-CM

## 2019-08-12 DIAGNOSIS — E1169 Type 2 diabetes mellitus with other specified complication: Secondary | ICD-10-CM | POA: Diagnosis not present

## 2019-08-12 DIAGNOSIS — J449 Chronic obstructive pulmonary disease, unspecified: Secondary | ICD-10-CM | POA: Diagnosis not present

## 2019-08-13 ENCOUNTER — Telehealth: Payer: Self-pay | Admitting: Internal Medicine

## 2019-08-13 DIAGNOSIS — N1831 Chronic kidney disease, stage 3a: Secondary | ICD-10-CM | POA: Diagnosis not present

## 2019-08-13 DIAGNOSIS — J449 Chronic obstructive pulmonary disease, unspecified: Secondary | ICD-10-CM | POA: Diagnosis not present

## 2019-08-13 DIAGNOSIS — E1169 Type 2 diabetes mellitus with other specified complication: Secondary | ICD-10-CM | POA: Diagnosis not present

## 2019-08-13 DIAGNOSIS — E7849 Other hyperlipidemia: Secondary | ICD-10-CM | POA: Diagnosis not present

## 2019-08-13 DIAGNOSIS — I2510 Atherosclerotic heart disease of native coronary artery without angina pectoris: Secondary | ICD-10-CM | POA: Diagnosis not present

## 2019-08-13 DIAGNOSIS — G3184 Mild cognitive impairment, so stated: Secondary | ICD-10-CM | POA: Diagnosis not present

## 2019-08-13 DIAGNOSIS — E559 Vitamin D deficiency, unspecified: Secondary | ICD-10-CM | POA: Diagnosis not present

## 2019-08-13 DIAGNOSIS — I10 Essential (primary) hypertension: Secondary | ICD-10-CM | POA: Diagnosis not present

## 2019-08-13 DIAGNOSIS — E1122 Type 2 diabetes mellitus with diabetic chronic kidney disease: Secondary | ICD-10-CM | POA: Diagnosis not present

## 2019-08-13 NOTE — Telephone Encounter (Signed)
Called patient per 7/16 sch msg - no answer ,. Left message for patient to call back to reschedule appt.

## 2019-08-16 DIAGNOSIS — I10 Essential (primary) hypertension: Secondary | ICD-10-CM | POA: Diagnosis not present

## 2019-08-16 DIAGNOSIS — E1169 Type 2 diabetes mellitus with other specified complication: Secondary | ICD-10-CM | POA: Diagnosis not present

## 2019-08-16 DIAGNOSIS — N1831 Chronic kidney disease, stage 3a: Secondary | ICD-10-CM | POA: Diagnosis not present

## 2019-08-16 DIAGNOSIS — J449 Chronic obstructive pulmonary disease, unspecified: Secondary | ICD-10-CM | POA: Diagnosis not present

## 2019-08-16 DIAGNOSIS — E1122 Type 2 diabetes mellitus with diabetic chronic kidney disease: Secondary | ICD-10-CM | POA: Diagnosis not present

## 2019-08-16 DIAGNOSIS — E7849 Other hyperlipidemia: Secondary | ICD-10-CM | POA: Diagnosis not present

## 2019-08-16 DIAGNOSIS — I2510 Atherosclerotic heart disease of native coronary artery without angina pectoris: Secondary | ICD-10-CM | POA: Diagnosis not present

## 2019-08-16 DIAGNOSIS — G3184 Mild cognitive impairment, so stated: Secondary | ICD-10-CM | POA: Diagnosis not present

## 2019-08-16 DIAGNOSIS — E559 Vitamin D deficiency, unspecified: Secondary | ICD-10-CM | POA: Diagnosis not present

## 2019-08-17 ENCOUNTER — Ambulatory Visit: Payer: Medicare HMO | Admitting: Podiatry

## 2019-08-19 DIAGNOSIS — I2510 Atherosclerotic heart disease of native coronary artery without angina pectoris: Secondary | ICD-10-CM | POA: Diagnosis not present

## 2019-08-19 DIAGNOSIS — E1122 Type 2 diabetes mellitus with diabetic chronic kidney disease: Secondary | ICD-10-CM | POA: Diagnosis not present

## 2019-08-19 DIAGNOSIS — N1831 Chronic kidney disease, stage 3a: Secondary | ICD-10-CM | POA: Diagnosis not present

## 2019-08-19 DIAGNOSIS — E559 Vitamin D deficiency, unspecified: Secondary | ICD-10-CM | POA: Diagnosis not present

## 2019-08-19 DIAGNOSIS — G3184 Mild cognitive impairment, so stated: Secondary | ICD-10-CM | POA: Diagnosis not present

## 2019-08-19 DIAGNOSIS — I10 Essential (primary) hypertension: Secondary | ICD-10-CM | POA: Diagnosis not present

## 2019-08-19 DIAGNOSIS — E7849 Other hyperlipidemia: Secondary | ICD-10-CM | POA: Diagnosis not present

## 2019-08-19 DIAGNOSIS — E1169 Type 2 diabetes mellitus with other specified complication: Secondary | ICD-10-CM | POA: Diagnosis not present

## 2019-08-19 DIAGNOSIS — J449 Chronic obstructive pulmonary disease, unspecified: Secondary | ICD-10-CM | POA: Diagnosis not present

## 2019-08-23 ENCOUNTER — Encounter: Payer: Self-pay | Admitting: Podiatry

## 2019-08-23 ENCOUNTER — Inpatient Hospital Stay: Payer: Medicare HMO

## 2019-08-23 ENCOUNTER — Ambulatory Visit: Payer: Medicare HMO

## 2019-08-23 ENCOUNTER — Other Ambulatory Visit: Payer: Self-pay

## 2019-08-23 ENCOUNTER — Encounter: Payer: Self-pay | Admitting: Internal Medicine

## 2019-08-23 ENCOUNTER — Ambulatory Visit (INDEPENDENT_AMBULATORY_CARE_PROVIDER_SITE_OTHER): Payer: Medicare HMO | Admitting: Podiatry

## 2019-08-23 ENCOUNTER — Inpatient Hospital Stay (HOSPITAL_BASED_OUTPATIENT_CLINIC_OR_DEPARTMENT_OTHER): Payer: Medicare HMO | Admitting: Internal Medicine

## 2019-08-23 VITALS — BP 122/61 | HR 63 | Temp 97.3°F | Resp 18 | Ht 64.0 in | Wt 159.1 lb

## 2019-08-23 VITALS — BP 128/64

## 2019-08-23 DIAGNOSIS — L84 Corns and callosities: Secondary | ICD-10-CM | POA: Diagnosis not present

## 2019-08-23 DIAGNOSIS — M79609 Pain in unspecified limb: Secondary | ICD-10-CM | POA: Diagnosis not present

## 2019-08-23 DIAGNOSIS — Z794 Long term (current) use of insulin: Secondary | ICD-10-CM | POA: Diagnosis not present

## 2019-08-23 DIAGNOSIS — N189 Chronic kidney disease, unspecified: Secondary | ICD-10-CM | POA: Diagnosis not present

## 2019-08-23 DIAGNOSIS — E559 Vitamin D deficiency, unspecified: Secondary | ICD-10-CM | POA: Diagnosis not present

## 2019-08-23 DIAGNOSIS — B351 Tinea unguium: Secondary | ICD-10-CM | POA: Diagnosis not present

## 2019-08-23 DIAGNOSIS — Z79899 Other long term (current) drug therapy: Secondary | ICD-10-CM | POA: Diagnosis not present

## 2019-08-23 DIAGNOSIS — M8580 Other specified disorders of bone density and structure, unspecified site: Secondary | ICD-10-CM | POA: Diagnosis not present

## 2019-08-23 DIAGNOSIS — E1122 Type 2 diabetes mellitus with diabetic chronic kidney disease: Secondary | ICD-10-CM

## 2019-08-23 DIAGNOSIS — D638 Anemia in other chronic diseases classified elsewhere: Secondary | ICD-10-CM | POA: Diagnosis not present

## 2019-08-23 DIAGNOSIS — I6381 Other cerebral infarction due to occlusion or stenosis of small artery: Secondary | ICD-10-CM | POA: Diagnosis not present

## 2019-08-23 DIAGNOSIS — I129 Hypertensive chronic kidney disease with stage 1 through stage 4 chronic kidney disease, or unspecified chronic kidney disease: Secondary | ICD-10-CM | POA: Diagnosis not present

## 2019-08-23 DIAGNOSIS — M216X9 Other acquired deformities of unspecified foot: Secondary | ICD-10-CM

## 2019-08-23 DIAGNOSIS — N183 Chronic kidney disease, stage 3 unspecified: Secondary | ICD-10-CM

## 2019-08-23 DIAGNOSIS — D631 Anemia in chronic kidney disease: Secondary | ICD-10-CM | POA: Diagnosis not present

## 2019-08-23 DIAGNOSIS — I10 Essential (primary) hypertension: Secondary | ICD-10-CM

## 2019-08-23 DIAGNOSIS — E119 Type 2 diabetes mellitus without complications: Secondary | ICD-10-CM | POA: Diagnosis not present

## 2019-08-23 DIAGNOSIS — J449 Chronic obstructive pulmonary disease, unspecified: Secondary | ICD-10-CM | POA: Diagnosis not present

## 2019-08-23 DIAGNOSIS — E782 Mixed hyperlipidemia: Secondary | ICD-10-CM | POA: Diagnosis not present

## 2019-08-23 DIAGNOSIS — I2510 Atherosclerotic heart disease of native coronary artery without angina pectoris: Secondary | ICD-10-CM | POA: Diagnosis not present

## 2019-08-23 DIAGNOSIS — Z7982 Long term (current) use of aspirin: Secondary | ICD-10-CM | POA: Diagnosis not present

## 2019-08-23 DIAGNOSIS — I6782 Cerebral ischemia: Secondary | ICD-10-CM | POA: Diagnosis not present

## 2019-08-23 DIAGNOSIS — R0602 Shortness of breath: Secondary | ICD-10-CM | POA: Diagnosis not present

## 2019-08-23 DIAGNOSIS — G9340 Encephalopathy, unspecified: Secondary | ICD-10-CM | POA: Diagnosis not present

## 2019-08-23 LAB — CBC WITH DIFFERENTIAL (CANCER CENTER ONLY)
Abs Immature Granulocytes: 0.05 10*3/uL (ref 0.00–0.07)
Basophils Absolute: 0 10*3/uL (ref 0.0–0.1)
Basophils Relative: 1 %
Eosinophils Absolute: 0.2 10*3/uL (ref 0.0–0.5)
Eosinophils Relative: 2 %
HCT: 31.3 % — ABNORMAL LOW (ref 36.0–46.0)
Hemoglobin: 10 g/dL — ABNORMAL LOW (ref 12.0–15.0)
Immature Granulocytes: 1 %
Lymphocytes Relative: 21 %
Lymphs Abs: 1.8 10*3/uL (ref 0.7–4.0)
MCH: 29.3 pg (ref 26.0–34.0)
MCHC: 31.9 g/dL (ref 30.0–36.0)
MCV: 91.8 fL (ref 80.0–100.0)
Monocytes Absolute: 0.6 10*3/uL (ref 0.1–1.0)
Monocytes Relative: 7 %
Neutro Abs: 5.9 10*3/uL (ref 1.7–7.7)
Neutrophils Relative %: 68 %
Platelet Count: 319 10*3/uL (ref 150–400)
RBC: 3.41 MIL/uL — ABNORMAL LOW (ref 3.87–5.11)
RDW: 16.9 % — ABNORMAL HIGH (ref 11.5–15.5)
WBC Count: 8.5 10*3/uL (ref 4.0–10.5)
nRBC: 0 % (ref 0.0–0.2)

## 2019-08-23 LAB — IRON AND TIBC
Iron: 79 ug/dL (ref 41–142)
Saturation Ratios: 26 % (ref 21–57)
TIBC: 305 ug/dL (ref 236–444)
UIBC: 226 ug/dL (ref 120–384)

## 2019-08-23 LAB — FERRITIN: Ferritin: 451 ng/mL — ABNORMAL HIGH (ref 11–307)

## 2019-08-23 MED ORDER — EPOETIN ALFA-EPBX 40000 UNIT/ML IJ SOLN: 40000.0000 [IU] | Freq: Once | INTRAMUSCULAR | Status: DC

## 2019-08-23 MED ORDER — EPOETIN ALFA-EPBX 40000 UNIT/ML IJ SOLN
INTRAMUSCULAR | Status: AC
  Filled 2019-08-23: qty 1

## 2019-08-23 NOTE — Progress Notes (Signed)
[place] [phone]) [phone]   [phone]  OFFICE PROGRESS NOTE  [name], NP-C [place] [id number]  DIAGNOSIS: Anemia of chronic disease secondary to chronic renal insufficiency  PRIOR THERAPY: None  CURRENT THERAPY: Retacrit 40,000 units subcutaneously every 2 weeks in addition to over-the-counter oral iron tablet 1 tablet every day.  INTERVAL HISTORY: Courtney Goodman 73 y.o. female returns to the clinic today for follow-up visit.  The patient is feeling fine today with no concerning complaints except for some memory issues and she is followed by neurology and expected to have MRI of the brain next month.  She denied having any current chest pain, shortness of breath, cough or hemoptysis.  She denied having any fatigue or weakness.  She has no nausea, vomiting, diarrhea or constipation.  She denied having any headache or visual changes.  She continues to tolerate her treatment with Retacrit fairly well.  She is here today for evaluation and repeat blood work.  MEDICAL HISTORY: Past Medical History:  Diagnosis Date  . Anemia of chronic disease   . Asthma   . Atherosclerotic heart disease of native coronary artery without angina pectoris   . CKD (chronic kidney disease)   . COPD (chronic obstructive pulmonary disease) ([place])   . DM (diabetes mellitus), type 2 with renal complications ([place])   . Dysrhythmia   . Elevated ferritin 01/03/2017  . GAD (generalized anxiety disorder)   . Hypertension   . Mixed hyperlipidemia due to type 2 diabetes mellitus ([place])   . Mixed incontinence    per medical records from [place]  . Osteopenia   . Personal history of noncompliance with medical treatment, presenting hazards to health   . Shortness of breath dyspnea   . Suicidal ideation   . TB (tuberculosis), treated    age 46  . Vitamin D deficiency     ALLERGIES:  has No Known Allergies.  MEDICATIONS:  Current Outpatient  Medications  Medication Sig Dispense Refill  . albuterol (PROVENTIL HFA;VENTOLIN HFA) 108 (90 Base) MCG/ACT inhaler Inhale 2 puffs into the lungs every 6 (six) hours as needed for wheezing or shortness of breath. 1 Inhaler 0  . Alcohol Swabs (ALCOHOL PREP) PADS Use for testing (Patient taking differently: 1 each by Other route See admin instructions. Use for testing) 100 each 1  . amLODipine-benazepril (LOTREL) 5-20 MG capsule Take 1 capsule by mouth daily. 90 capsule 0  . aspirin EC 81 MG tablet Take 81 mg by mouth daily.     . Biotin 10000 MCG TABS Take 1 tablet by mouth daily.     . Blood Glucose Monitoring Suppl (TRUE METRIX METER) w/Device KIT 1 each by Does not apply route 2 (two) times daily. Use to monitor glucose levels BID; E11.29 (Patient taking differently: 1 each by Does not apply route See admin instructions. Use to monitor glucose levels BID; E11.29) 1 kit 0  . carvedilol (COREG) 6.25 MG tablet Take 1 tablet (6.25 mg total) by mouth 2 (two) times daily. 180 tablet 0  . Cholecalciferol 25 MCG (1000 UT) tablet Take 1 tablet by mouth daily. 90 tablet 0  . citalopram (CELEXA) 20 MG tablet Take 1 tablet (20 mg total) by mouth daily. (Patient taking differently: Take 30 mg by mouth daily. ) 90 tablet 1  . fenofibrate (TRICOR) 145 MG tablet Take 1 tablet by mouth daily. (Patient taking differently: Take 145 mg by mouth daily. ) 90 tablet 0  .  fluticasone (FLONASE) 50 MCG/ACT nasal spray Place 2 sprays into both nostrils daily. 48 g 0  . folic acid (FOLVITE) 1 MG tablet Take 1 tablet (1 mg total) by mouth daily. 90 tablet 0  . glucose blood (TRUE METRIX BLOOD GLUCOSE TEST) test strip 1 each by Other route 2 (two) times daily. use for testing 200 strip 2  . insulin detemir (LEVEMIR) 100 UNIT/ML FlexPen Inject 80 Units into the skin every morning. 45 pen 3  . Insulin Pen Needle 31G X 5 MM MISC Inject 1 each into the skin 2 (two) times daily. Use to inject insulin twice daily; E11.29 100 each 1   . omeprazole (PRILOSEC) 40 MG capsule Take 1 capsule (40 mg total) by mouth daily. 90 capsule 0  . simvastatin (ZOCOR) 20 MG tablet Take 1 tablet by mouth every night. (Patient taking differently: Take 20 mg by mouth daily at 6 PM. ) 90 tablet 0  . traZODone (DESYREL) 50 MG tablet Take 50 mg by mouth at bedtime.     Marland Kitchen VICTOZA 18 MG/3ML SOPN Inject 1.8 mg subcutaneously daily. 27 mL 0   No current facility-administered medications for this visit.    SURGICAL HISTORY:  Past Surgical History:  Procedure Laterality Date  . ANKLE RECONSTRUCTION     right  . CARPAL TUNNEL RELEASE    . CORONARY ANGIOPLASTY      REVIEW OF SYSTEMS:  A comprehensive review of systems was negative except for: Constitutional: positive for fatigue   PHYSICAL EXAMINATION: General appearance: alert, cooperative and no distress Head: Normocephalic, without obvious abnormality, atraumatic Neck: no adenopathy, no JVD, supple, symmetrical, trachea midline and thyroid not enlarged, symmetric, no tenderness/mass/nodules Lymph nodes: Cervical, supraclavicular, and axillary nodes normal. Resp: clear to auscultation bilaterally Back: symmetric, no curvature. ROM normal. No CVA tenderness. Cardio: regular rate and rhythm, S1, S2 normal, no murmur, click, rub or gallop GI: soft, non-tender; bowel sounds normal; no masses,  no organomegaly Extremities: extremities normal, atraumatic, no cyanosis or edema  ECOG PERFORMANCE STATUS: 1 - Symptomatic but completely ambulatory  Blood pressure (!) 122/61, pulse 63, temperature (!) 97.3 F (36.3 C), temperature source Temporal, resp. rate 18, height 5' 4"  (1.626 m), weight 159 lb 1.6 oz (72.2 kg), SpO2 100 %.  LABORATORY DATA: Lab Results  Component Value Date   WBC 8.5 08/23/2019   HGB 10.0 (L) 08/23/2019   HCT 31.3 (L) 08/23/2019   MCV 91.8 08/23/2019   PLT 319 08/23/2019      Chemistry      Component Value Date/Time   NA 140 08/10/2019 1251   NA 146 (H) 05/07/2017  1147   NA 137 05/29/2011 1946   K 4.4 08/10/2019 1251   K 3.9 05/29/2011 1946   CL 109 08/10/2019 1251   CL 100 05/29/2011 1946   CO2 22 08/10/2019 1251   CO2 28 05/29/2011 1946   BUN 38 (H) 08/10/2019 1251   BUN 28 (H) 05/07/2017 1147   BUN 24 (H) 05/29/2011 1946   CREATININE 1.78 (H) 08/10/2019 1251   CREATININE 1.52 (H) 04/08/2018 1154   CREATININE 1.56 (H) 01/01/2017 0941      Component Value Date/Time   CALCIUM 9.6 08/10/2019 1251   CALCIUM 9.8 05/29/2011 1946   ALKPHOS 40 08/10/2019 1251   ALKPHOS 86 05/29/2011 1946   AST 21 08/10/2019 1251   AST 14 (L) 04/08/2018 1154   ALT 13 08/10/2019 1251   ALT 13 04/08/2018 1154   ALT 15 05/29/2011 1946  BILITOT 0.7 08/10/2019 1251   BILITOT 0.4 04/08/2018 1154       RADIOGRAPHIC STUDIES: CT Head Wo Contrast  Result Date: 08/10/2019 CLINICAL DATA:  Encephalopathy.  Additional provided: Confusion. EXAM: CT HEAD WITHOUT CONTRAST TECHNIQUE: Contiguous axial images were obtained from the base of the skull through the vertex without intravenous contrast. COMPARISON:  Head CT 05/29/2010 FINDINGS: Brain: Mild generalized parenchymal atrophy. Mild ill-defined hypoattenuation within the cerebral white matter is nonspecific, but consistent with chronic small vessel ischemic disease. Redemonstrated small chronic infarct within the right caudate head. There is no acute intracranial hemorrhage. No demarcated cortical infarct is identified. No extra-axial fluid collection. No evidence of intracranial mass. No midline shift. Vascular: No hyperdense vessel.  Atherosclerotic calcifications Skull: Normal. Negative for fracture or focal lesion. Sinuses/Orbits: Visualized orbits show no acute finding. Mild ethmoid sinus mucosal thickening. Moderate right maxillary sinus mucosal thickening. No significant mastoid effusion. IMPRESSION: No CT evidence of acute intracranial abnormality. Mild generalized parenchymal atrophy and chronic small vessel ischemic  disease. Redemonstrated small chronic right basal ganglia lacunar infarct. Paranasal sinus mucosal thickening as described, most notably right maxillary. Electronically Signed   By: Kellie Simmering DO   On: 08/10/2019 12:32   DG Chest Port 1 View  Result Date: 08/10/2019 CLINICAL DATA:  Shortness of breath. EXAM: PORTABLE CHEST 1 VIEW COMPARISON:  August 25, 2018. FINDINGS: The heart size and mediastinal contours are within normal limits. Stable left upper lobe scarring. No acute abnormality is noted. No pneumothorax or pleural effusion is noted. The visualized skeletal structures are unremarkable. IMPRESSION: No active disease. Electronically Signed   By: Marijo Conception M.D.   On: 08/10/2019 13:03    ASSESSMENT AND PLAN: This is a very pleasant 74 years old African-American female with anemia of chronic disease secondary to chronic renal insufficiency.  The patient is currently on treatment with Retacrit 40,000 units every 2 weeks and has been tolerating this treatment well.  She is also on oral iron tablets 1-2 tablets every day. CBC today showed hemoglobin of 10.  Iron study and ferritin are still pending. I recommended for the patient to continue her current treatment with Retacrit and iron supplements. I will see her back for follow-up visit in 3 months with repeat lab work. She was advised to call immediately if she has any concerning symptoms in the interval. The patient voices understanding of current disease status and treatment options and is in agreement with the current care plan. All questions were answered. The patient knows to call the clinic with any problems, questions or concerns. We can certainly see the patient much sooner if necessary.   Disclaimer: This note was dictated with voice recognition software. Similar sounding words can inadvertently be transcribed and may not be corrected upon review.

## 2019-08-23 NOTE — Progress Notes (Signed)
Made aware by pharmacy that patient will not get injection if HGB is 10 or higher. Made the MD and patient aware as well.

## 2019-08-23 NOTE — Patient Instructions (Signed)

## 2019-08-23 NOTE — Progress Notes (Signed)
Subjective: Courtney Goodman presents today for for annual diabetic foot examination and painful callus(es) b/l feet and painful thick toenails that are difficult to trim. Pain interferes with ambulation. Aggravating factors include wearing enclosed shoe gear. Pain is relieved with periodic professional debridement. She voices no new pedal concerns on today's visit.  Courtney Goodman, Courtney Goodman is patient's PCP. Last visit was 06/23/2019 via telemedicine.  She states she has been hospitalized for hypoglycemia when her blood sugar dropped into the 30's. She states she also needs to see a neurologist for memory problems. She has forgotten a lot of doctors' appointments lately. She states her blood sugar was 88 mg/dl and she did not eat breakfast before coming to her appointment. She states she has 2 visiting nurses who will come to her home on today.   Past Medical History:  Diagnosis Date  . Anemia of chronic disease   . Asthma   . Atherosclerotic heart disease of native coronary artery without angina pectoris   . CKD (chronic kidney disease)   . COPD (chronic obstructive pulmonary disease) ([place])   . DM (diabetes mellitus), type 2 with renal complications ([place])   . Dysrhythmia   . Elevated ferritin 01/03/2017  . GAD (generalized anxiety disorder)   . Hypertension   . Mixed hyperlipidemia due to type 2 diabetes mellitus ([place])   . Mixed incontinence    per medical records from [place]  . Osteopenia   . Personal history of noncompliance with medical treatment, presenting hazards to health   . Shortness of breath dyspnea   . Suicidal ideation   . TB (tuberculosis), treated    age 56  . Vitamin D deficiency     Patient Active Problem List   Diagnosis Date Noted  . Medication noncompliance due to cognitive impairment 10/12/2018  . Severe episode of recurrent major depressive disorder, without psychotic features ([place]) 10/12/2018  . Acute renal failure superimposed on stage 3 chronic kidney disease  ([place]) 09/22/2018  . Leukocytosis 09/22/2018  . Non-intractable vomiting   . GERD (gastroesophageal reflux disease) 08/23/2017  . CAD (coronary artery disease) 08/23/2017  . Hypoglycemia 08/23/2017  . Acute metabolic encephalopathy 90/24/0973  . Hypokalemia 08/23/2017  . Type II diabetes mellitus with renal manifestations ([place]) 08/23/2017  . Elevated ferritin 01/03/2017  . Personal history of noncompliance with medical treatment, presenting hazards to health   . Urinary incontinence, mixed 05/27/2016  . Major depression, recurrent ([place]) 05/27/2016  . Atherosclerotic heart disease of native coronary artery without angina pectoris 05/27/2016  . GAD (generalized anxiety disorder)   . Anemia of chronic disease   . Mixed hyperlipidemia due to type 2 diabetes mellitus ([place])   . Diabetes ([place]) 11/30/2015  . Vitamin D deficiency 11/23/2015  . HTN (hypertension) 11/23/2015  . Anemia 11/23/2015  . CKD (chronic kidney disease), stage III 11/23/2015  . Dysrhythmia   . COPD (chronic obstructive pulmonary disease) ([place])     Past Surgical History:  Procedure Laterality Date  . ANKLE RECONSTRUCTION     right  . CARPAL TUNNEL RELEASE    . CORONARY ANGIOPLASTY      Current Outpatient Medications on File Prior to Visit  Medication Sig Dispense Refill  . albuterol (PROVENTIL HFA;VENTOLIN HFA) 108 (90 Base) MCG/ACT inhaler Inhale 2 puffs into the lungs every 6 (six) hours as needed for wheezing or shortness of breath. 1 Inhaler 0  . Alcohol Swabs (ALCOHOL PREP) PADS Use for testing (Patient taking differently: 1 each by Other route See admin  instructions. Use for testing) 100 each 1  . amLODipine-benazepril (LOTREL) 5-20 MG capsule Take 1 capsule by mouth daily. 90 capsule 0  . aspirin EC 81 MG tablet Take 81 mg by mouth daily.     . Biotin 10000 MCG TABS Take 1 tablet by mouth daily.     . Blood Glucose Monitoring Suppl (TRUE METRIX METER) w/Device KIT 1 each by Does not apply route 2 (two) times  daily. Use to monitor glucose levels BID; E11.29 (Patient taking differently: 1 each by Does not apply route See admin instructions. Use to monitor glucose levels BID; E11.29) 1 kit 0  . carvedilol (COREG) 6.25 MG tablet Take 1 tablet (6.25 mg total) by mouth 2 (two) times daily. 180 tablet 0  . Cholecalciferol 25 MCG (1000 UT) tablet Take 1 tablet by mouth daily. 90 tablet 0  . citalopram (CELEXA) 20 MG tablet Take 1 tablet (20 mg total) by mouth daily. (Patient taking differently: Take 30 mg by mouth daily. ) 90 tablet 1  . fenofibrate (TRICOR) 145 MG tablet Take 1 tablet by mouth daily. (Patient taking differently: Take 145 mg by mouth daily. ) 90 tablet 0  . fluticasone (FLONASE) 50 MCG/ACT nasal spray Place 2 sprays into both nostrils daily. 48 g 0  . folic acid (FOLVITE) 1 MG tablet Take 1 tablet (1 mg total) by mouth daily. 90 tablet 0  . glucose blood (TRUE METRIX BLOOD GLUCOSE TEST) test strip 1 each by Other route 2 (two) times daily. use for testing 200 strip 2  . insulin detemir (LEVEMIR) 100 UNIT/ML FlexPen Inject 80 Units into the skin every morning. 45 pen 3  . Insulin Pen Needle 31G X 5 MM MISC Inject 1 each into the skin 2 (two) times daily. Use to inject insulin twice daily; E11.29 100 each 1  . omeprazole (PRILOSEC) 40 MG capsule Take 1 capsule (40 mg total) by mouth daily. 90 capsule 0  . simvastatin (ZOCOR) 20 MG tablet Take 1 tablet by mouth every night. (Patient taking differently: Take 20 mg by mouth daily at 6 PM. ) 90 tablet 0  . traZODone (DESYREL) 50 MG tablet Take 50 mg by mouth at bedtime.     Marland Kitchen VICTOZA 18 MG/3ML SOPN Inject 1.8 mg subcutaneously daily. 27 mL 0   No current facility-administered medications on file prior to visit.     No Known Allergies  Social History   Occupational History  . Not on file  Tobacco Use  . Smoking status: Former Research scientist (life sciences)  . Smokeless tobacco: Never Used  Vaping Use  . Vaping Use: Never used  Substance and Sexual Activity  .  Alcohol use: No  . Drug use: No  . Sexual activity: Never    Family History  Problem Relation Age of Onset  . Diabetes Mother     Immunization History  Administered Date(s) Administered  . Influenza, High Dose Seasonal PF 11/23/2015, 01/03/2017  . Pneumococcal Conjugate-13 04/18/2016  . Pneumococcal Polysaccharide-23 05/07/2017     Objective: There were no vitals filed for this visit.  Courtney Goodman is a pleasant 74 y.o. female in NAD. AAO X 3.  Vascular Examination: Capillary fill time to digits <3 seconds b/l lower extremities. Faintly palpable DP pulse(s) b/l lower extremities. Faintly palpable PT pulse(s) b/l lower extremities. Pedal hair absent. Lower extremity skin temperature gradient within normal limits. No pain with calf compression b/l. No edema noted b/l lower extremities. No ischemia or gangrene noted b/l lower extremities.  Dermatological Examination: Pedal skin with normal turgor, texture and tone bilaterally. No open wounds bilaterally. No interdigital macerations bilaterally. Toenails 1-5 b/l elongated, discolored, dystrophic, thickened, crumbly with subungual debris and tenderness to dorsal palpation. Hyperkeratotic lesion(s) submet head 5 left foot and submet head 5 right foot.  No erythema, no edema, no drainage, no flocculence.  Musculoskeletal Examination: Normal muscle strength 5/5 to all lower extremity muscle groups bilaterally. No pain crepitus or joint limitation noted with ROM b/l. Plantarflexed metatarsal(s) 5th metatarsals b/l lower extremities.  Footwear Assessment: Does the patient wear appropriate shoes? Yes. Does the patient need inserts/orthotics? Yes.  Neurological Examination: Protective sensation intact 5/5 intact bilaterally with 10g monofilament b/l. Vibratory sensation intact left lower extremity. Vibratory sensation diminished right lower extremity. Proprioception intact bilaterally. Clonus negative b/l.  Hemoglobin A1C Latest Ref Rng  & Units 06/21/2019 03/22/2019 01/12/2019 11/13/2018 09/15/2018  HGBA1C 4.0 - 5.6 % 7.5(A) 7.6(A) 7.1(A) 10.4(A) 9.8(A)  Some recent data might be hidden   Assessment: 1. Pain due to onychomycosis of nail   2. Callus   3. Plantar flexed metatarsal, unspecified laterality   4. Type 2 diabetes mellitus with stage 3 chronic kidney disease, with long-term current use of insulin, unspecified whether stage 3a or 3b CKD ([place])   5. Encounter for diabetic foot exam ([place])     Risk Categorization: Low Risk :  Patient has all of the following: Intact protective sensation No prior foot ulcer  No severe deformity Pedal pulses present  Plan: -Examined patient. -No new findings. No new orders. -Diabetic foot examination performed on today's visit. -Continue diabetic foot care principles. -Toenails 1-5 b/l were debrided in length and girth with sterile nail nippers and dremel without iatrogenic bleeding.  -Callus(es) submet head 5 left foot and submet head 5 right foot pared utilizing sterile scalpel blade without complication or incident. Total number debrided =2. -Patient to report any pedal injuries to medical professional immediately. -Patient to continue soft, supportive shoe gear daily. -Patient/POA to call should there be question/concern in the interim.  Return in about 3 months (around 11/23/2019) for diabetic nail and callus trim.  Marzetta Board, DPM

## 2019-08-24 ENCOUNTER — Telehealth: Payer: Self-pay | Admitting: Internal Medicine

## 2019-08-24 DIAGNOSIS — I10 Essential (primary) hypertension: Secondary | ICD-10-CM | POA: Diagnosis not present

## 2019-08-24 DIAGNOSIS — E7849 Other hyperlipidemia: Secondary | ICD-10-CM | POA: Diagnosis not present

## 2019-08-24 DIAGNOSIS — G3184 Mild cognitive impairment, so stated: Secondary | ICD-10-CM | POA: Diagnosis not present

## 2019-08-24 DIAGNOSIS — E1122 Type 2 diabetes mellitus with diabetic chronic kidney disease: Secondary | ICD-10-CM | POA: Diagnosis not present

## 2019-08-24 DIAGNOSIS — J449 Chronic obstructive pulmonary disease, unspecified: Secondary | ICD-10-CM | POA: Diagnosis not present

## 2019-08-24 DIAGNOSIS — E559 Vitamin D deficiency, unspecified: Secondary | ICD-10-CM | POA: Diagnosis not present

## 2019-08-24 DIAGNOSIS — N1831 Chronic kidney disease, stage 3a: Secondary | ICD-10-CM | POA: Diagnosis not present

## 2019-08-24 DIAGNOSIS — E1169 Type 2 diabetes mellitus with other specified complication: Secondary | ICD-10-CM | POA: Diagnosis not present

## 2019-08-24 DIAGNOSIS — I2510 Atherosclerotic heart disease of native coronary artery without angina pectoris: Secondary | ICD-10-CM | POA: Diagnosis not present

## 2019-08-24 NOTE — Telephone Encounter (Signed)
Scheduled appt per 7/26 los - pt is aware of appt and date

## 2019-08-25 ENCOUNTER — Telehealth: Payer: Self-pay | Admitting: Family Medicine

## 2019-08-25 DIAGNOSIS — E1169 Type 2 diabetes mellitus with other specified complication: Secondary | ICD-10-CM | POA: Diagnosis not present

## 2019-08-25 DIAGNOSIS — I10 Essential (primary) hypertension: Secondary | ICD-10-CM | POA: Diagnosis not present

## 2019-08-25 DIAGNOSIS — N1831 Chronic kidney disease, stage 3a: Secondary | ICD-10-CM | POA: Diagnosis not present

## 2019-08-25 DIAGNOSIS — I2510 Atherosclerotic heart disease of native coronary artery without angina pectoris: Secondary | ICD-10-CM | POA: Diagnosis not present

## 2019-08-25 DIAGNOSIS — E1122 Type 2 diabetes mellitus with diabetic chronic kidney disease: Secondary | ICD-10-CM | POA: Diagnosis not present

## 2019-08-25 DIAGNOSIS — E7849 Other hyperlipidemia: Secondary | ICD-10-CM | POA: Diagnosis not present

## 2019-08-25 DIAGNOSIS — J449 Chronic obstructive pulmonary disease, unspecified: Secondary | ICD-10-CM | POA: Diagnosis not present

## 2019-08-25 DIAGNOSIS — E559 Vitamin D deficiency, unspecified: Secondary | ICD-10-CM | POA: Diagnosis not present

## 2019-08-25 DIAGNOSIS — G3184 Mild cognitive impairment, so stated: Secondary | ICD-10-CM | POA: Diagnosis not present

## 2019-08-25 NOTE — Telephone Encounter (Signed)
LVM advising of the ok.

## 2019-08-25 NOTE — Telephone Encounter (Signed)
ok 

## 2019-08-25 NOTE — Telephone Encounter (Signed)
Received a call from [facility] with [facility]. She is requesting speech therapy evaluation for pt. She can be reached at [phone] and ok to leave message on secure line. Sending to [place] due to Vickie being out.

## 2019-08-26 DIAGNOSIS — E1169 Type 2 diabetes mellitus with other specified complication: Secondary | ICD-10-CM | POA: Diagnosis not present

## 2019-08-26 DIAGNOSIS — J449 Chronic obstructive pulmonary disease, unspecified: Secondary | ICD-10-CM | POA: Diagnosis not present

## 2019-08-26 DIAGNOSIS — I10 Essential (primary) hypertension: Secondary | ICD-10-CM | POA: Diagnosis not present

## 2019-08-26 DIAGNOSIS — E1122 Type 2 diabetes mellitus with diabetic chronic kidney disease: Secondary | ICD-10-CM | POA: Diagnosis not present

## 2019-08-26 DIAGNOSIS — I2510 Atherosclerotic heart disease of native coronary artery without angina pectoris: Secondary | ICD-10-CM | POA: Diagnosis not present

## 2019-08-26 DIAGNOSIS — G3184 Mild cognitive impairment, so stated: Secondary | ICD-10-CM | POA: Diagnosis not present

## 2019-08-26 DIAGNOSIS — N1831 Chronic kidney disease, stage 3a: Secondary | ICD-10-CM | POA: Diagnosis not present

## 2019-08-26 DIAGNOSIS — E7849 Other hyperlipidemia: Secondary | ICD-10-CM | POA: Diagnosis not present

## 2019-08-26 DIAGNOSIS — E559 Vitamin D deficiency, unspecified: Secondary | ICD-10-CM | POA: Diagnosis not present

## 2019-08-27 DIAGNOSIS — E7849 Other hyperlipidemia: Secondary | ICD-10-CM | POA: Diagnosis not present

## 2019-08-27 DIAGNOSIS — E559 Vitamin D deficiency, unspecified: Secondary | ICD-10-CM | POA: Diagnosis not present

## 2019-08-27 DIAGNOSIS — J449 Chronic obstructive pulmonary disease, unspecified: Secondary | ICD-10-CM | POA: Diagnosis not present

## 2019-08-27 DIAGNOSIS — I2510 Atherosclerotic heart disease of native coronary artery without angina pectoris: Secondary | ICD-10-CM | POA: Diagnosis not present

## 2019-08-27 DIAGNOSIS — I10 Essential (primary) hypertension: Secondary | ICD-10-CM | POA: Diagnosis not present

## 2019-08-27 DIAGNOSIS — E1169 Type 2 diabetes mellitus with other specified complication: Secondary | ICD-10-CM | POA: Diagnosis not present

## 2019-08-27 DIAGNOSIS — G3184 Mild cognitive impairment, so stated: Secondary | ICD-10-CM | POA: Diagnosis not present

## 2019-08-27 DIAGNOSIS — E1122 Type 2 diabetes mellitus with diabetic chronic kidney disease: Secondary | ICD-10-CM | POA: Diagnosis not present

## 2019-08-27 DIAGNOSIS — N1831 Chronic kidney disease, stage 3a: Secondary | ICD-10-CM | POA: Diagnosis not present

## 2019-08-30 ENCOUNTER — Telehealth: Payer: Self-pay | Admitting: Family Medicine

## 2019-08-30 DIAGNOSIS — I2510 Atherosclerotic heart disease of native coronary artery without angina pectoris: Secondary | ICD-10-CM | POA: Diagnosis not present

## 2019-08-30 DIAGNOSIS — E7849 Other hyperlipidemia: Secondary | ICD-10-CM | POA: Diagnosis not present

## 2019-08-30 DIAGNOSIS — G3184 Mild cognitive impairment, so stated: Secondary | ICD-10-CM | POA: Diagnosis not present

## 2019-08-30 DIAGNOSIS — N1831 Chronic kidney disease, stage 3a: Secondary | ICD-10-CM | POA: Diagnosis not present

## 2019-08-30 DIAGNOSIS — E1169 Type 2 diabetes mellitus with other specified complication: Secondary | ICD-10-CM | POA: Diagnosis not present

## 2019-08-30 DIAGNOSIS — E1122 Type 2 diabetes mellitus with diabetic chronic kidney disease: Secondary | ICD-10-CM | POA: Diagnosis not present

## 2019-08-30 DIAGNOSIS — J449 Chronic obstructive pulmonary disease, unspecified: Secondary | ICD-10-CM | POA: Diagnosis not present

## 2019-08-30 DIAGNOSIS — I10 Essential (primary) hypertension: Secondary | ICD-10-CM | POA: Diagnosis not present

## 2019-08-30 DIAGNOSIS — E559 Vitamin D deficiency, unspecified: Secondary | ICD-10-CM | POA: Diagnosis not present

## 2019-08-30 NOTE — Telephone Encounter (Signed)
Received a call from [facility] with [facility]. She did a speech evaluation on pt today and is requesting verbal orders for speech therapy for the following:  2 a week for 2 weeks 1 a week for 2 weeks  Sending to [place] as Loletha Carrow is out of office Alla Feeling can be reached at [place].

## 2019-08-30 NOTE — Telephone Encounter (Signed)
Advised K[facility] 

## 2019-08-30 NOTE — Telephone Encounter (Signed)
ok 

## 2019-09-01 ENCOUNTER — Encounter: Payer: Self-pay | Admitting: Family Medicine

## 2019-09-01 ENCOUNTER — Telehealth: Payer: Self-pay

## 2019-09-01 DIAGNOSIS — E1169 Type 2 diabetes mellitus with other specified complication: Secondary | ICD-10-CM | POA: Diagnosis not present

## 2019-09-01 DIAGNOSIS — I10 Essential (primary) hypertension: Secondary | ICD-10-CM | POA: Diagnosis not present

## 2019-09-01 DIAGNOSIS — G3184 Mild cognitive impairment, so stated: Secondary | ICD-10-CM | POA: Diagnosis not present

## 2019-09-01 DIAGNOSIS — E1122 Type 2 diabetes mellitus with diabetic chronic kidney disease: Secondary | ICD-10-CM | POA: Diagnosis not present

## 2019-09-01 DIAGNOSIS — J449 Chronic obstructive pulmonary disease, unspecified: Secondary | ICD-10-CM | POA: Diagnosis not present

## 2019-09-01 DIAGNOSIS — N1831 Chronic kidney disease, stage 3a: Secondary | ICD-10-CM | POA: Diagnosis not present

## 2019-09-01 DIAGNOSIS — E7849 Other hyperlipidemia: Secondary | ICD-10-CM | POA: Diagnosis not present

## 2019-09-01 DIAGNOSIS — E559 Vitamin D deficiency, unspecified: Secondary | ICD-10-CM | POA: Diagnosis not present

## 2019-09-01 DIAGNOSIS — I2510 Atherosclerotic heart disease of native coronary artery without angina pectoris: Secondary | ICD-10-CM | POA: Diagnosis not present

## 2019-09-01 NOTE — Telephone Encounter (Signed)
Courtney Goodman with [facility] wants to know if she can get a verbal order for PT once a week for 4 weeks. She can be reached at [phone]. Sending this message to you since Loletha Carrow is out.

## 2019-09-01 NOTE — Telephone Encounter (Signed)
I called [name] back and gave the verbal order for PT for once weekly for 4 weeks. She said it was for lt. Knee pain.

## 2019-09-01 NOTE — Telephone Encounter (Signed)
I do not have a problem giving the verbal order so we can give a yes, but need to clarify what that therapy is for.  What is the condition?.  Please list this since I am not real familiar with her case and she is Vickie's patient

## 2019-09-02 DIAGNOSIS — I2510 Atherosclerotic heart disease of native coronary artery without angina pectoris: Secondary | ICD-10-CM | POA: Diagnosis not present

## 2019-09-02 DIAGNOSIS — E7849 Other hyperlipidemia: Secondary | ICD-10-CM | POA: Diagnosis not present

## 2019-09-02 DIAGNOSIS — I10 Essential (primary) hypertension: Secondary | ICD-10-CM | POA: Diagnosis not present

## 2019-09-02 DIAGNOSIS — G3184 Mild cognitive impairment, so stated: Secondary | ICD-10-CM | POA: Diagnosis not present

## 2019-09-02 DIAGNOSIS — J449 Chronic obstructive pulmonary disease, unspecified: Secondary | ICD-10-CM | POA: Diagnosis not present

## 2019-09-02 DIAGNOSIS — E559 Vitamin D deficiency, unspecified: Secondary | ICD-10-CM | POA: Diagnosis not present

## 2019-09-02 DIAGNOSIS — N1831 Chronic kidney disease, stage 3a: Secondary | ICD-10-CM | POA: Diagnosis not present

## 2019-09-02 DIAGNOSIS — E1169 Type 2 diabetes mellitus with other specified complication: Secondary | ICD-10-CM | POA: Diagnosis not present

## 2019-09-02 DIAGNOSIS — E1122 Type 2 diabetes mellitus with diabetic chronic kidney disease: Secondary | ICD-10-CM | POA: Diagnosis not present

## 2019-09-03 ENCOUNTER — Ambulatory Visit
Admission: RE | Admit: 2019-09-03 | Discharge: 2019-09-03 | Disposition: A | Discharge status: Home / Self Care | Payer: Medicare HMO | Source: Ambulatory Visit | Attending: Neurology | Admitting: Neurology

## 2019-09-03 DIAGNOSIS — R600 Localized edema: Secondary | ICD-10-CM | POA: Diagnosis not present

## 2019-09-03 DIAGNOSIS — E162 Hypoglycemia, unspecified: Secondary | ICD-10-CM

## 2019-09-03 DIAGNOSIS — J449 Chronic obstructive pulmonary disease, unspecified: Secondary | ICD-10-CM | POA: Diagnosis not present

## 2019-09-03 DIAGNOSIS — R2681 Unsteadiness on feet: Secondary | ICD-10-CM

## 2019-09-03 DIAGNOSIS — I2510 Atherosclerotic heart disease of native coronary artery without angina pectoris: Secondary | ICD-10-CM | POA: Diagnosis not present

## 2019-09-03 DIAGNOSIS — I6389 Other cerebral infarction: Secondary | ICD-10-CM | POA: Diagnosis not present

## 2019-09-03 DIAGNOSIS — R413 Other amnesia: Secondary | ICD-10-CM

## 2019-09-03 DIAGNOSIS — E559 Vitamin D deficiency, unspecified: Secondary | ICD-10-CM | POA: Diagnosis not present

## 2019-09-03 DIAGNOSIS — G3184 Mild cognitive impairment, so stated: Secondary | ICD-10-CM | POA: Diagnosis not present

## 2019-09-03 DIAGNOSIS — I10 Essential (primary) hypertension: Secondary | ICD-10-CM | POA: Diagnosis not present

## 2019-09-03 DIAGNOSIS — N1831 Chronic kidney disease, stage 3a: Secondary | ICD-10-CM | POA: Diagnosis not present

## 2019-09-03 DIAGNOSIS — E7849 Other hyperlipidemia: Secondary | ICD-10-CM | POA: Diagnosis not present

## 2019-09-03 DIAGNOSIS — E1122 Type 2 diabetes mellitus with diabetic chronic kidney disease: Secondary | ICD-10-CM | POA: Diagnosis not present

## 2019-09-03 DIAGNOSIS — E1169 Type 2 diabetes mellitus with other specified complication: Secondary | ICD-10-CM | POA: Diagnosis not present

## 2019-09-03 DIAGNOSIS — G319 Degenerative disease of nervous system, unspecified: Secondary | ICD-10-CM | POA: Diagnosis not present

## 2019-09-04 ENCOUNTER — Other Ambulatory Visit: Payer: Self-pay | Admitting: Family Medicine

## 2019-09-06 ENCOUNTER — Telehealth: Payer: Self-pay

## 2019-09-06 NOTE — Telephone Encounter (Signed)
Spoke to patients daughter [name] and informed her of her mothers MRI results. Patients daughter expressed understanding.

## 2019-09-06 NOTE — Telephone Encounter (Signed)
-----   Message from Cameron Sprang, MD sent at 09/06/2019  8:36 AM EDT ----- Pls let her know MRI brain did not show any tumor, stroke, or bleed. It showed age-related changes, thanks

## 2019-09-07 DIAGNOSIS — R413 Other amnesia: Secondary | ICD-10-CM | POA: Diagnosis not present

## 2019-09-07 DIAGNOSIS — D649 Anemia, unspecified: Secondary | ICD-10-CM | POA: Diagnosis not present

## 2019-09-07 DIAGNOSIS — I129 Hypertensive chronic kidney disease with stage 1 through stage 4 chronic kidney disease, or unspecified chronic kidney disease: Secondary | ICD-10-CM | POA: Diagnosis not present

## 2019-09-07 DIAGNOSIS — K219 Gastro-esophageal reflux disease without esophagitis: Secondary | ICD-10-CM | POA: Diagnosis not present

## 2019-09-07 DIAGNOSIS — E1122 Type 2 diabetes mellitus with diabetic chronic kidney disease: Secondary | ICD-10-CM | POA: Diagnosis not present

## 2019-09-07 DIAGNOSIS — N183 Chronic kidney disease, stage 3 unspecified: Secondary | ICD-10-CM | POA: Diagnosis not present

## 2019-09-09 ENCOUNTER — Telehealth: Payer: Self-pay | Admitting: Family Medicine

## 2019-09-09 DIAGNOSIS — E1169 Type 2 diabetes mellitus with other specified complication: Secondary | ICD-10-CM | POA: Diagnosis not present

## 2019-09-09 DIAGNOSIS — E7849 Other hyperlipidemia: Secondary | ICD-10-CM | POA: Diagnosis not present

## 2019-09-09 DIAGNOSIS — I2510 Atherosclerotic heart disease of native coronary artery without angina pectoris: Secondary | ICD-10-CM | POA: Diagnosis not present

## 2019-09-09 DIAGNOSIS — J449 Chronic obstructive pulmonary disease, unspecified: Secondary | ICD-10-CM | POA: Diagnosis not present

## 2019-09-09 DIAGNOSIS — G3184 Mild cognitive impairment, so stated: Secondary | ICD-10-CM | POA: Diagnosis not present

## 2019-09-09 DIAGNOSIS — N1831 Chronic kidney disease, stage 3a: Secondary | ICD-10-CM | POA: Diagnosis not present

## 2019-09-09 DIAGNOSIS — E559 Vitamin D deficiency, unspecified: Secondary | ICD-10-CM | POA: Diagnosis not present

## 2019-09-09 DIAGNOSIS — E1122 Type 2 diabetes mellitus with diabetic chronic kidney disease: Secondary | ICD-10-CM | POA: Diagnosis not present

## 2019-09-09 DIAGNOSIS — I10 Essential (primary) hypertension: Secondary | ICD-10-CM | POA: Diagnosis not present

## 2019-09-09 NOTE — Telephone Encounter (Signed)
ok 

## 2019-09-09 NOTE — Telephone Encounter (Signed)
Left message for pt to call back  °

## 2019-09-09 NOTE — Telephone Encounter (Signed)
Duanne Guess speech therapist with [facility] [phone] she would like a verbal order for medical social work evaluation for their social worker to come in and see pt. For assistance for personal care and etc.

## 2019-09-10 DIAGNOSIS — I2510 Atherosclerotic heart disease of native coronary artery without angina pectoris: Secondary | ICD-10-CM | POA: Diagnosis not present

## 2019-09-10 DIAGNOSIS — J449 Chronic obstructive pulmonary disease, unspecified: Secondary | ICD-10-CM | POA: Diagnosis not present

## 2019-09-10 DIAGNOSIS — G3184 Mild cognitive impairment, so stated: Secondary | ICD-10-CM | POA: Diagnosis not present

## 2019-09-10 DIAGNOSIS — E559 Vitamin D deficiency, unspecified: Secondary | ICD-10-CM | POA: Diagnosis not present

## 2019-09-10 DIAGNOSIS — E1169 Type 2 diabetes mellitus with other specified complication: Secondary | ICD-10-CM | POA: Diagnosis not present

## 2019-09-10 DIAGNOSIS — E1122 Type 2 diabetes mellitus with diabetic chronic kidney disease: Secondary | ICD-10-CM | POA: Diagnosis not present

## 2019-09-10 DIAGNOSIS — N1831 Chronic kidney disease, stage 3a: Secondary | ICD-10-CM | POA: Diagnosis not present

## 2019-09-10 DIAGNOSIS — I10 Essential (primary) hypertension: Secondary | ICD-10-CM | POA: Diagnosis not present

## 2019-09-10 DIAGNOSIS — E7849 Other hyperlipidemia: Secondary | ICD-10-CM | POA: Diagnosis not present

## 2019-09-10 NOTE — Telephone Encounter (Signed)
Pt was notified of results

## 2019-09-14 ENCOUNTER — Other Ambulatory Visit: Payer: Self-pay | Admitting: Family Medicine

## 2019-09-14 DIAGNOSIS — E1122 Type 2 diabetes mellitus with diabetic chronic kidney disease: Secondary | ICD-10-CM | POA: Diagnosis not present

## 2019-09-14 DIAGNOSIS — G3184 Mild cognitive impairment, so stated: Secondary | ICD-10-CM | POA: Diagnosis not present

## 2019-09-14 DIAGNOSIS — J449 Chronic obstructive pulmonary disease, unspecified: Secondary | ICD-10-CM | POA: Diagnosis not present

## 2019-09-14 DIAGNOSIS — N1831 Chronic kidney disease, stage 3a: Secondary | ICD-10-CM | POA: Diagnosis not present

## 2019-09-14 DIAGNOSIS — E559 Vitamin D deficiency, unspecified: Secondary | ICD-10-CM | POA: Diagnosis not present

## 2019-09-14 DIAGNOSIS — I2510 Atherosclerotic heart disease of native coronary artery without angina pectoris: Secondary | ICD-10-CM | POA: Diagnosis not present

## 2019-09-14 DIAGNOSIS — E1169 Type 2 diabetes mellitus with other specified complication: Secondary | ICD-10-CM | POA: Diagnosis not present

## 2019-09-14 DIAGNOSIS — E7849 Other hyperlipidemia: Secondary | ICD-10-CM | POA: Diagnosis not present

## 2019-09-14 DIAGNOSIS — I10 Essential (primary) hypertension: Secondary | ICD-10-CM | POA: Diagnosis not present

## 2019-09-15 ENCOUNTER — Encounter: Payer: Self-pay | Admitting: Counselor

## 2019-09-15 ENCOUNTER — Ambulatory Visit (INDEPENDENT_AMBULATORY_CARE_PROVIDER_SITE_OTHER): Payer: Medicare HMO | Admitting: Endocrinology

## 2019-09-15 ENCOUNTER — Encounter: Payer: Self-pay | Admitting: Endocrinology

## 2019-09-15 ENCOUNTER — Other Ambulatory Visit: Payer: Self-pay

## 2019-09-15 ENCOUNTER — Ambulatory Visit (INDEPENDENT_AMBULATORY_CARE_PROVIDER_SITE_OTHER): Payer: Medicare HMO | Admitting: Counselor

## 2019-09-15 VITALS — BP 122/70 | HR 68 | Ht 64.0 in | Wt 159.4 lb

## 2019-09-15 DIAGNOSIS — F329 Major depressive disorder, single episode, unspecified: Secondary | ICD-10-CM

## 2019-09-15 DIAGNOSIS — E1169 Type 2 diabetes mellitus with other specified complication: Secondary | ICD-10-CM | POA: Diagnosis not present

## 2019-09-15 DIAGNOSIS — F09 Unspecified mental disorder due to known physiological condition: Secondary | ICD-10-CM

## 2019-09-15 DIAGNOSIS — N183 Chronic kidney disease, stage 3 unspecified: Secondary | ICD-10-CM

## 2019-09-15 DIAGNOSIS — J449 Chronic obstructive pulmonary disease, unspecified: Secondary | ICD-10-CM | POA: Diagnosis not present

## 2019-09-15 DIAGNOSIS — E1122 Type 2 diabetes mellitus with diabetic chronic kidney disease: Secondary | ICD-10-CM | POA: Diagnosis not present

## 2019-09-15 DIAGNOSIS — I10 Essential (primary) hypertension: Secondary | ICD-10-CM | POA: Diagnosis not present

## 2019-09-15 DIAGNOSIS — E559 Vitamin D deficiency, unspecified: Secondary | ICD-10-CM | POA: Diagnosis not present

## 2019-09-15 DIAGNOSIS — N1831 Chronic kidney disease, stage 3a: Secondary | ICD-10-CM | POA: Diagnosis not present

## 2019-09-15 DIAGNOSIS — Z794 Long term (current) use of insulin: Secondary | ICD-10-CM

## 2019-09-15 DIAGNOSIS — E1165 Type 2 diabetes mellitus with hyperglycemia: Secondary | ICD-10-CM

## 2019-09-15 DIAGNOSIS — F32A Depression, unspecified: Secondary | ICD-10-CM

## 2019-09-15 DIAGNOSIS — G3184 Mild cognitive impairment, so stated: Secondary | ICD-10-CM | POA: Diagnosis not present

## 2019-09-15 DIAGNOSIS — I2510 Atherosclerotic heart disease of native coronary artery without angina pectoris: Secondary | ICD-10-CM | POA: Diagnosis not present

## 2019-09-15 DIAGNOSIS — E7849 Other hyperlipidemia: Secondary | ICD-10-CM | POA: Diagnosis not present

## 2019-09-15 LAB — POCT GLYCOSYLATED HEMOGLOBIN (HGB A1C): Hemoglobin A1C: 8.2 % — AB (ref 4.0–5.6)

## 2019-09-15 LAB — POCT GLUCOSE (DEVICE FOR HOME USE): POC Glucose: 386 mg/dl — AB (ref 70–99)

## 2019-09-15 MED ORDER — INSULIN DETEMIR 100 UNIT/ML FLEXPEN: 55.0000 [IU] | PEN_INJECTOR | SUBCUTANEOUS | 11 refills | Status: DC

## 2019-09-15 NOTE — Progress Notes (Signed)
Subjective:    Patient ID: Courtney Goodman, female    DOB: October 18, 1945, 74 y.o.   MRN: [id number]  HPI Pt returns for f/u of diabetes mellitus: DM type: Insulin-requiring type 2 Dx'ed: 5027 Complications: stage 3 CRI and CAD.  Therapy: insulin since 2009, and victoza. GDM: never DKA: never Severe hypoglycemia: 2012, 2019, and 2021.   Pancreatitis: never.  SDOH: ins declined tresiba.   Other: she is on qd insulin, due to h/o noncompliance.   Interval history: no cbg record, but states cbg varies from 150-300.  Pt says she takes levemir 50 units qam, and she does not miss this. Past Medical History:  Diagnosis Date  . Anemia of chronic disease   . Asthma   . Atherosclerotic heart disease of native coronary artery without angina pectoris   . CKD (chronic kidney disease)   . COPD (chronic obstructive pulmonary disease) ([place])   . DM (diabetes mellitus), type 2 with renal complications ([place])   . Dysrhythmia   . Elevated ferritin 01/03/2017  . GAD (generalized anxiety disorder)   . Hypertension   . Mixed hyperlipidemia due to type 2 diabetes mellitus ([place])   . Mixed incontinence    per medical records from [facility]  . Osteopenia   . Personal history of noncompliance with medical treatment, presenting hazards to health   . Shortness of breath dyspnea   . Suicidal ideation   . TB (tuberculosis), treated    age 5  . Vitamin D deficiency     Past Surgical History:  Procedure Laterality Date  . ANKLE RECONSTRUCTION     right  . CARPAL TUNNEL RELEASE    . CORONARY ANGIOPLASTY      Social History   Socioeconomic History  . Marital status: Divorced    Spouse name: Not on file  . Number of children: 2  . Years of education: 33  . Highest education level: High school graduate  Occupational History  . Not on file  Tobacco Use  . Smoking status: Former Research scientist (life sciences)  . Smokeless tobacco: Never Used  Vaping Use  . Vaping Use: Never used  Substance and Sexual Activity  . Alcohol  use: No  . Drug use: No  . Sexual activity: Never  Other Topics Concern  . Not on file  Social History Narrative   Lives alone    Left handed   Social Determinants of Health   Financial Resource Strain: Medium Risk  . Difficulty of Paying Living Expenses: Somewhat hard  Food Insecurity: Food Insecurity Present  . Worried About Charity fundraiser in the Last Year: Sometimes true  . Ran Out of Food in the Last Year: Sometimes true  Transportation Needs: No Transportation Needs  . Lack of Transportation (Medical): No  . Lack of Transportation (Non-Medical): No  Physical Activity: Inactive  . Days of Exercise per Week: 0 days  . Minutes of Exercise per Session: 0 min  Stress: Stress Concern Present  . Feeling of Stress : Very much  Social Connections: Moderately Isolated  . Frequency of Communication with Friends and Family: More than three times a week  . Frequency of Social Gatherings with Friends and Family: More than three times a week  . Attends Religious Services: More than 4 times per year  . Active Member of Clubs or Organizations: No  . Attends Archivist Meetings: Never  . Marital Status: Divorced  Human resources officer Violence: Not At Risk  . Fear of Current or Ex-Partner: No  .  Emotionally Abused: No  . Physically Abused: No  . Sexually Abused: No    Current Outpatient Medications on File Prior to Visit  Medication Sig Dispense Refill  . albuterol (PROVENTIL HFA;VENTOLIN HFA) 108 (90 Base) MCG/ACT inhaler Inhale 2 puffs into the lungs every 6 (six) hours as needed for wheezing or shortness of breath. 1 Inhaler 0  . Alcohol Swabs (ALCOHOL PREP) PADS Use for testing (Patient taking differently: 1 each by Other route 2 (two) times daily. E11.9) 100 each 1  . amLODipine-benazepril (LOTREL) 5-20 MG capsule Take 1 capsule by mouth daily. 90 capsule 0  . aspirin EC 81 MG tablet Take 81 mg by mouth daily.     . Biotin 10000 MCG TABS Take 1 tablet by mouth daily.       . Blood Glucose Monitoring Suppl (TRUE METRIX METER) w/Device KIT 1 each by Does not apply route 2 (two) times daily. Use to monitor glucose levels BID; E11.29 (Patient taking differently: 1 each by Does not apply route 2 (two) times daily. E11.29) 1 kit 0  . carvedilol (COREG) 6.25 MG tablet Take 1 tablet by mouth twice daily. 180 tablet 0  . Cholecalciferol 25 MCG (1000 UT) tablet Take 1 tablet by mouth daily. 90 tablet 0  . citalopram (CELEXA) 20 MG tablet Take 1 tablet (20 mg total) by mouth daily. (Patient taking differently: Take 30 mg by mouth daily. ) 90 tablet 1  . fenofibrate (TRICOR) 145 MG tablet Take 1 tablet by mouth daily. 90 tablet 0  . fluticasone (FLONASE) 50 MCG/ACT nasal spray Place 2 sprays into both nostrils daily. 48 g 0  . folic acid (FOLVITE) 1 MG tablet Take 1 tablet (1 mg total) by mouth daily. 90 tablet 0  . glucose blood (TRUE METRIX BLOOD GLUCOSE TEST) test strip 1 each by Other route 2 (two) times daily. use for testing (Patient taking differently: 1 each by Other route 2 (two) times daily. E11.9) 200 strip 2  . Insulin Pen Needle 31G X 5 MM MISC Inject 1 each into the skin 2 (two) times daily. Use to inject insulin twice daily; E11.29 (Patient taking differently: Inject 1 each into the skin 2 (two) times daily. E11.29) 100 each 1  . omeprazole (PRILOSEC) 40 MG capsule TAKE 1 CAPSULE(40 MG) BY MOUTH DAILY 90 capsule 0  . simvastatin (ZOCOR) 20 MG tablet Take 1 tablet by mouth every night. 90 tablet 0  . traZODone (DESYREL) 50 MG tablet Take 50 mg by mouth at bedtime.     Marland Kitchen VICTOZA 18 MG/3ML SOPN Inject 1.8 mg subcutaneously daily. 27 mL 0   No current facility-administered medications on file prior to visit.    No Known Allergies  Family History  Problem Relation Age of Onset  . Diabetes Mother     BP 122/70   Pulse 68   Ht 5' 4"  (1.626 m)   Wt 159 lb 6.4 oz (72.3 kg)   SpO2 94%   BMI 27.36 kg/m   Review of Systems She denies hypoglycemia     Objective:   Physical Exam VITAL SIGNS:  See vs page GENERAL: no distress Pulses: dorsalis pedis intact bilat.   MSK: no deformity of the feet CV: no leg edema Skin:  no ulcer on the feet.  normal color and temp on the feet. Neuro: sensation is intact to touch on the feet   Lab Results  Component Value Date   HGBA1C 8.2 (A) 09/15/2019  Assessment & Plan:  Stage 3 CRI, with CRI: uncontrolled.   Patient Instructions  Please increase the Levemir to 55 units each morning.  Today only, eat plenty, to avoid the sugar from being low in the morning.  check your blood sugar twice a day.  vary the time of day when you check, between before the 3 meals, and at bedtime.  also check if you have symptoms of your blood sugar being too high or too low.  please keep a record of the readings and bring it to your next appointment here (or you can bring the meter itself).  You can write it on any piece of paper.  please call us sooner if your blood sugar goes below 70, or if you have a lot of readings over 200. Please come back for a follow-up appointment in 2 months.
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
## 2019-09-15 NOTE — Patient Instructions (Addendum)
Please increase the Levemir to 55 units each morning.  Today only, eat plenty, to avoid the sugar from being low in the morning.  check your blood sugar twice a day.  vary the time of day when you check, between before the 3 meals, and at bedtime.  also check if you have symptoms of your blood sugar being too high or too low.  please keep a record of the readings and bring it to your next appointment here (or you can bring the meter itself).  You can write it on any piece of paper.  please call us sooner if your blood sugar goes below 70, or if you have a lot of readings over 200. Please come back for a follow-up appointment in 2 months.

## 2019-09-15 NOTE — Progress Notes (Signed)
Pt was seen today by Neurology and while at appt, CBG was checked and found to be above 300. Pt presents for acute appt today to address elevated CBG. States she doesn't have an appetite and feels nauseated. Also stated she drank 1 can of Ensure prior to seeing Neurology today followed by "some water". Was unable to provide total amount of fluid consumed. Denies having attempted to correct her elevated CBG with insulin. Also denies having taken any steroids. CBG was obtained per protocol with results of 386. Dr. Loanne Drilling made aware and states he will further evaluate and address.

## 2019-09-16 DIAGNOSIS — E1122 Type 2 diabetes mellitus with diabetic chronic kidney disease: Secondary | ICD-10-CM | POA: Diagnosis not present

## 2019-09-16 DIAGNOSIS — I2510 Atherosclerotic heart disease of native coronary artery without angina pectoris: Secondary | ICD-10-CM | POA: Diagnosis not present

## 2019-09-16 DIAGNOSIS — E559 Vitamin D deficiency, unspecified: Secondary | ICD-10-CM | POA: Diagnosis not present

## 2019-09-16 DIAGNOSIS — N1831 Chronic kidney disease, stage 3a: Secondary | ICD-10-CM | POA: Diagnosis not present

## 2019-09-16 DIAGNOSIS — G3184 Mild cognitive impairment, so stated: Secondary | ICD-10-CM | POA: Diagnosis not present

## 2019-09-16 DIAGNOSIS — I10 Essential (primary) hypertension: Secondary | ICD-10-CM | POA: Diagnosis not present

## 2019-09-16 DIAGNOSIS — E7849 Other hyperlipidemia: Secondary | ICD-10-CM | POA: Diagnosis not present

## 2019-09-16 DIAGNOSIS — E1169 Type 2 diabetes mellitus with other specified complication: Secondary | ICD-10-CM | POA: Diagnosis not present

## 2019-09-16 DIAGNOSIS — J449 Chronic obstructive pulmonary disease, unspecified: Secondary | ICD-10-CM | POA: Diagnosis not present

## 2019-09-16 NOTE — Telephone Encounter (Signed)
Courtney Goodman handled

## 2019-09-20 ENCOUNTER — Ambulatory Visit: Payer: Medicare HMO | Admitting: Endocrinology

## 2019-09-20 DIAGNOSIS — Z20822 Contact with and (suspected) exposure to covid-19: Secondary | ICD-10-CM | POA: Diagnosis not present

## 2019-09-22 ENCOUNTER — Encounter: Payer: Medicare HMO | Admitting: Counselor

## 2019-09-23 DIAGNOSIS — I10 Essential (primary) hypertension: Secondary | ICD-10-CM | POA: Diagnosis not present

## 2019-09-23 DIAGNOSIS — E1122 Type 2 diabetes mellitus with diabetic chronic kidney disease: Secondary | ICD-10-CM | POA: Diagnosis not present

## 2019-09-23 DIAGNOSIS — J449 Chronic obstructive pulmonary disease, unspecified: Secondary | ICD-10-CM | POA: Diagnosis not present

## 2019-09-23 DIAGNOSIS — I2510 Atherosclerotic heart disease of native coronary artery without angina pectoris: Secondary | ICD-10-CM | POA: Diagnosis not present

## 2019-09-23 DIAGNOSIS — E559 Vitamin D deficiency, unspecified: Secondary | ICD-10-CM | POA: Diagnosis not present

## 2019-09-23 DIAGNOSIS — E1169 Type 2 diabetes mellitus with other specified complication: Secondary | ICD-10-CM | POA: Diagnosis not present

## 2019-09-23 DIAGNOSIS — N1831 Chronic kidney disease, stage 3a: Secondary | ICD-10-CM | POA: Diagnosis not present

## 2019-09-23 DIAGNOSIS — G3184 Mild cognitive impairment, so stated: Secondary | ICD-10-CM | POA: Diagnosis not present

## 2019-09-23 DIAGNOSIS — E7849 Other hyperlipidemia: Secondary | ICD-10-CM | POA: Diagnosis not present

## 2019-09-25 ENCOUNTER — Encounter (HOSPITAL_COMMUNITY): Payer: Self-pay

## 2019-09-25 ENCOUNTER — Emergency Department (HOSPITAL_COMMUNITY)
Admission: EM | Admit: 2019-09-25 | Discharge: 2019-09-26 | Disposition: A | Discharge status: Home / Self Care | Payer: Medicare HMO | Attending: Emergency Medicine | Admitting: Emergency Medicine

## 2019-09-25 DIAGNOSIS — Z87891 Personal history of nicotine dependence: Secondary | ICD-10-CM | POA: Insufficient documentation

## 2019-09-25 DIAGNOSIS — R410 Disorientation, unspecified: Secondary | ICD-10-CM | POA: Diagnosis not present

## 2019-09-25 DIAGNOSIS — Z20822 Contact with and (suspected) exposure to covid-19: Secondary | ICD-10-CM | POA: Diagnosis not present

## 2019-09-25 DIAGNOSIS — Z79899 Other long term (current) drug therapy: Secondary | ICD-10-CM | POA: Insufficient documentation

## 2019-09-25 DIAGNOSIS — I10 Essential (primary) hypertension: Secondary | ICD-10-CM | POA: Diagnosis not present

## 2019-09-25 DIAGNOSIS — N183 Chronic kidney disease, stage 3 unspecified: Secondary | ICD-10-CM | POA: Diagnosis not present

## 2019-09-25 DIAGNOSIS — E860 Dehydration: Secondary | ICD-10-CM | POA: Insufficient documentation

## 2019-09-25 DIAGNOSIS — I700 Atherosclerosis of aorta: Secondary | ICD-10-CM | POA: Diagnosis not present

## 2019-09-25 DIAGNOSIS — Z90710 Acquired absence of both cervix and uterus: Secondary | ICD-10-CM | POA: Diagnosis not present

## 2019-09-25 DIAGNOSIS — E1122 Type 2 diabetes mellitus with diabetic chronic kidney disease: Secondary | ICD-10-CM | POA: Insufficient documentation

## 2019-09-25 DIAGNOSIS — I2510 Atherosclerotic heart disease of native coronary artery without angina pectoris: Secondary | ICD-10-CM | POA: Insufficient documentation

## 2019-09-25 DIAGNOSIS — I959 Hypotension, unspecified: Secondary | ICD-10-CM | POA: Diagnosis not present

## 2019-09-25 DIAGNOSIS — J449 Chronic obstructive pulmonary disease, unspecified: Secondary | ICD-10-CM | POA: Diagnosis not present

## 2019-09-25 DIAGNOSIS — Z794 Long term (current) use of insulin: Secondary | ICD-10-CM | POA: Insufficient documentation

## 2019-09-25 DIAGNOSIS — I6389 Other cerebral infarction: Secondary | ICD-10-CM | POA: Diagnosis not present

## 2019-09-25 DIAGNOSIS — J9811 Atelectasis: Secondary | ICD-10-CM | POA: Diagnosis not present

## 2019-09-25 DIAGNOSIS — R110 Nausea: Secondary | ICD-10-CM | POA: Insufficient documentation

## 2019-09-25 DIAGNOSIS — I129 Hypertensive chronic kidney disease with stage 1 through stage 4 chronic kidney disease, or unspecified chronic kidney disease: Secondary | ICD-10-CM | POA: Diagnosis not present

## 2019-09-25 DIAGNOSIS — I6782 Cerebral ischemia: Secondary | ICD-10-CM | POA: Diagnosis not present

## 2019-09-25 DIAGNOSIS — G319 Degenerative disease of nervous system, unspecified: Secondary | ICD-10-CM | POA: Diagnosis not present

## 2019-09-25 DIAGNOSIS — I4510 Unspecified right bundle-branch block: Secondary | ICD-10-CM | POA: Diagnosis not present

## 2019-09-25 DIAGNOSIS — R42 Dizziness and giddiness: Secondary | ICD-10-CM

## 2019-09-25 DIAGNOSIS — R531 Weakness: Secondary | ICD-10-CM | POA: Insufficient documentation

## 2019-09-25 DIAGNOSIS — R109 Unspecified abdominal pain: Secondary | ICD-10-CM | POA: Diagnosis not present

## 2019-09-25 LAB — BASIC METABOLIC PANEL
Anion gap: 9 (ref 5–15)
BUN: 41 mg/dL — ABNORMAL HIGH (ref 8–23)
CO2: 24 mmol/L (ref 22–32)
Calcium: 9.8 mg/dL (ref 8.9–10.3)
Chloride: 107 mmol/L (ref 98–111)
Creatinine, Ser: 2.34 mg/dL — ABNORMAL HIGH (ref 0.44–1.00)
GFR calc Af Amer: 23 mL/min — ABNORMAL LOW (ref >60)
GFR calc non Af Amer: 20 mL/min — ABNORMAL LOW (ref >60)
Glucose, Bld: 76 mg/dL (ref 70–99)
Potassium: 3.9 mmol/L (ref 3.5–5.1)
Sodium: 140 mmol/L (ref 135–145)

## 2019-09-25 LAB — URINALYSIS, ROUTINE W REFLEX MICROSCOPIC
Bilirubin Urine: NEGATIVE
Glucose, UA: 50 mg/dL — AB
Hgb urine dipstick: NEGATIVE
Ketones, ur: NEGATIVE mg/dL
Nitrite: NEGATIVE
Protein, ur: NEGATIVE mg/dL
Specific Gravity, Urine: 1.014 (ref 1.005–1.030)
pH: 5 (ref 5.0–8.0)

## 2019-09-25 LAB — CBC
HCT: 27.4 % — ABNORMAL LOW (ref 36.0–46.0)
Hemoglobin: 8.6 g/dL — ABNORMAL LOW (ref 12.0–15.0)
MCH: 29.1 pg (ref 26.0–34.0)
MCHC: 31.4 g/dL (ref 30.0–36.0)
MCV: 92.6 fL (ref 80.0–100.0)
Platelets: 307 10*3/uL (ref 150–400)
RBC: 2.96 MIL/uL — ABNORMAL LOW (ref 3.87–5.11)
RDW: 16.2 % — ABNORMAL HIGH (ref 11.5–15.5)
WBC: 7.5 10*3/uL (ref 4.0–10.5)
nRBC: 0 % (ref 0.0–0.2)

## 2019-09-25 NOTE — ED Triage Notes (Signed)
Pt comes via [facility] for dizziness and confusion that has been going on for the last few days, pt has been out of her Remeron and unable to eat and drink, +othostatic vitals, received 750cc fluid with EMS

## 2019-09-26 ENCOUNTER — Emergency Department (HOSPITAL_COMMUNITY): Payer: Medicare HMO

## 2019-09-26 DIAGNOSIS — R531 Weakness: Secondary | ICD-10-CM | POA: Diagnosis not present

## 2019-09-26 DIAGNOSIS — I6389 Other cerebral infarction: Secondary | ICD-10-CM | POA: Diagnosis not present

## 2019-09-26 DIAGNOSIS — J9811 Atelectasis: Secondary | ICD-10-CM | POA: Diagnosis not present

## 2019-09-26 DIAGNOSIS — R42 Dizziness and giddiness: Secondary | ICD-10-CM | POA: Diagnosis not present

## 2019-09-26 DIAGNOSIS — R410 Disorientation, unspecified: Secondary | ICD-10-CM | POA: Diagnosis not present

## 2019-09-26 DIAGNOSIS — R109 Unspecified abdominal pain: Secondary | ICD-10-CM | POA: Diagnosis not present

## 2019-09-26 DIAGNOSIS — Z90710 Acquired absence of both cervix and uterus: Secondary | ICD-10-CM | POA: Diagnosis not present

## 2019-09-26 DIAGNOSIS — I700 Atherosclerosis of aorta: Secondary | ICD-10-CM | POA: Diagnosis not present

## 2019-09-26 DIAGNOSIS — I6782 Cerebral ischemia: Secondary | ICD-10-CM | POA: Diagnosis not present

## 2019-09-26 DIAGNOSIS — G319 Degenerative disease of nervous system, unspecified: Secondary | ICD-10-CM | POA: Diagnosis not present

## 2019-09-26 LAB — CBC WITH DIFFERENTIAL/PLATELET
Abs Immature Granulocytes: 0.06 10*3/uL (ref 0.00–0.07)
Basophils Absolute: 0 10*3/uL (ref 0.0–0.1)
Basophils Relative: 0 %
Eosinophils Absolute: 0.1 10*3/uL (ref 0.0–0.5)
Eosinophils Relative: 1 %
HCT: 30.2 % — ABNORMAL LOW (ref 36.0–46.0)
Hemoglobin: 9.9 g/dL — ABNORMAL LOW (ref 12.0–15.0)
Immature Granulocytes: 1 %
Lymphocytes Relative: 20 %
Lymphs Abs: 2 10*3/uL (ref 0.7–4.0)
MCH: 29.6 pg (ref 26.0–34.0)
MCHC: 32.8 g/dL (ref 30.0–36.0)
MCV: 90.1 fL (ref 80.0–100.0)
Monocytes Absolute: 0.8 10*3/uL (ref 0.1–1.0)
Monocytes Relative: 7 %
Neutro Abs: 7.3 10*3/uL (ref 1.7–7.7)
Neutrophils Relative %: 71 %
Platelets: 321 10*3/uL (ref 150–400)
RBC: 3.35 MIL/uL — ABNORMAL LOW (ref 3.87–5.11)
RDW: 16.2 % — ABNORMAL HIGH (ref 11.5–15.5)
WBC: 10.2 10*3/uL (ref 4.0–10.5)
nRBC: 0 % (ref 0.0–0.2)

## 2019-09-26 LAB — SARS CORONAVIRUS 2 BY RT PCR (HOSPITAL ORDER, PERFORMED IN ~~LOC~~ HOSPITAL LAB): SARS Coronavirus 2: NEGATIVE

## 2019-09-26 LAB — CBG MONITORING, ED: Glucose-Capillary: 111 mg/dL — ABNORMAL HIGH (ref 70–99)

## 2019-09-26 LAB — POC OCCULT BLOOD, ED: Fecal Occult Bld: NEGATIVE

## 2019-09-26 MED ORDER — SODIUM CHLORIDE 0.9 % IV BOLUS
500.0000 mL | Freq: Once | INTRAVENOUS | Status: AC
  Administered 2019-09-26: 500 mL via INTRAVENOUS

## 2019-09-26 NOTE — ED Notes (Signed)
Pt was ambulated in hall, pt tolerated well with no signs of SOB, dizziness, o2 stayed @ 97-100%, HR was consistent @ 97. Pt was returned to rm with no complaints.

## 2019-09-26 NOTE — ED Provider Notes (Signed)
[facility] EMERGENCY DEPARTMENT Provider Note   CSN: [id number] Arrival date & time: 09/25/19  1933     History Chief Complaint  Patient presents with  . Dizziness    Courtney Goodman is a 74 y.o. female.  The history is provided by the patient and medical records. No language interpreter was used.  Dizziness  Courtney Goodman is a 74 y.o. female who presents to the Emergency Department complaining of dizziness. She presents the emergency department complaining of dizziness for the last month. She reports that she has been feeling lightheaded with room spinning sensation often on for about one month and has had an outpatient workup with MRI earlier this month. She states that yesterday when she woke up she felt particularly poorly with feeling sick and nauseous. No reports of fevers, chest pain, headache, shortness of breath, vomiting, diarrhea, constipation,black or bloody stools. She has been fully vaccinated for COVID-19. She was exposed to her grandson in the home who tested positive for COVID-19 on Friday. Symptoms are moderate and waxing and waning in nature.    Past Medical History:  Diagnosis Date  . Anemia of chronic disease   . Asthma   . Atherosclerotic heart disease of native coronary artery without angina pectoris   . CKD (chronic kidney disease)   . COPD (chronic obstructive pulmonary disease) ([place])   . DM (diabetes mellitus), type 2 with renal complications ([employer])   . Dysrhythmia   . Elevated ferritin 01/03/2017  . GAD (generalized anxiety disorder)   . Hypertension   . Mixed hyperlipidemia due to type 2 diabetes mellitus ([place])   . Mixed incontinence    per medical records from [place]  . Osteopenia   . Personal history of noncompliance with medical treatment, presenting hazards to health   . Shortness of breath dyspnea   . Suicidal ideation   . TB (tuberculosis), treated    age 40  . Vitamin D deficiency     Patient Active Problem List    Diagnosis Date Noted  . Medication noncompliance due to cognitive impairment 10/12/2018  . Severe episode of recurrent major depressive disorder, without psychotic features ([place]) 10/12/2018  . Acute renal failure superimposed on stage 3 chronic kidney disease ([place]) 09/22/2018  . Leukocytosis 09/22/2018  . Non-intractable vomiting   . GERD (gastroesophageal reflux disease) 08/23/2017  . CAD (coronary artery disease) 08/23/2017  . Hypoglycemia 08/23/2017  . Acute metabolic encephalopathy 68/12/7515  . Hypokalemia 08/23/2017  . Type II diabetes mellitus with renal manifestations ([place]) 08/23/2017  . Elevated ferritin 01/03/2017  . Personal history of noncompliance with medical treatment, presenting hazards to health   . Urinary incontinence, mixed 05/27/2016  . Major depression, recurrent ([place]) 05/27/2016  . Atherosclerotic heart disease of native coronary artery without angina pectoris 05/27/2016  . GAD (generalized anxiety disorder)   . Anemia of chronic disease   . Mixed hyperlipidemia due to type 2 diabetes mellitus ([name])   . Diabetes ([place]) 11/30/2015  . Vitamin D deficiency 11/23/2015  . HTN (hypertension) 11/23/2015  . Anemia 11/23/2015  . CKD (chronic kidney disease), stage III 11/23/2015  . Dysrhythmia   . COPD (chronic obstructive pulmonary disease) ([place])     Past Surgical History:  Procedure Laterality Date  . ANKLE RECONSTRUCTION     right  . CARPAL TUNNEL RELEASE    . CORONARY ANGIOPLASTY       OB History   No obstetric history on file.     Family History  Problem Relation Age of Onset  . Diabetes Mother     Social History   Tobacco Use  . Smoking status: Former Research scientist (life sciences)  . Smokeless tobacco: Never Used  Vaping Use  . Vaping Use: Never used  Substance Use Topics  . Alcohol use: No  . Drug use: No    Home Medications Prior to Admission medications   Medication Sig Start Date End Date Taking? Authorizing Provider  albuterol (PROVENTIL HFA;VENTOLIN  HFA) 108 (90 Base) MCG/ACT inhaler Inhale 2 puffs into the lungs every 6 (six) hours as needed for wheezing or shortness of breath. 04/14/17   Henson, Vickie L, NP-C  Alcohol Swabs (ALCOHOL PREP) PADS Use for testing Patient taking differently: 1 each by Other route 2 (two) times daily. E11.9 08/07/18   Henson, Vickie L, NP-C  amLODipine-benazepril (LOTREL) 5-20 MG capsule Take 1 capsule by mouth daily. 09/06/19   Henson, Vickie L, NP-C  aspirin EC 81 MG tablet Take 81 mg by mouth daily.     [provider]  Biotin 10000 MCG TABS Take 1 tablet by mouth daily.     [provider]  Blood Glucose Monitoring Suppl (TRUE METRIX METER) w/Device KIT 1 each by Does not apply route 2 (two) times daily. Use to monitor glucose levels BID; E11.29 Patient taking differently: 1 each by Does not apply route 2 (two) times daily. E11.29 10/16/18   Renato Shin, MD  carvedilol (COREG) 6.25 MG tablet Take 1 tablet by mouth twice daily. 09/06/19   Henson, Vickie L, NP-C  Cholecalciferol 25 MCG (1000 UT) tablet Take 1 tablet by mouth daily. 08/11/19   Henson, Vickie L, NP-C  citalopram (CELEXA) 20 MG tablet Take 1 tablet (20 mg total) by mouth daily. Patient taking differently: Take 30 mg by mouth daily.  09/05/16   Henson, Vickie L, NP-C  fenofibrate (TRICOR) 145 MG tablet Take 1 tablet by mouth daily. 09/06/19   Henson, Vickie L, NP-C  fluticasone (FLONASE) 50 MCG/ACT nasal spray Place 2 sprays into both nostrils daily. 07/25/16   Henson, Vickie L, NP-C  folic acid (FOLVITE) 1 MG tablet Take 1 tablet (1 mg total) by mouth daily. 08/09/19   Henson, Vickie L, NP-C  glucose blood (TRUE METRIX BLOOD GLUCOSE TEST) test strip 1 each by Other route 2 (two) times daily. use for testing Patient taking differently: 1 each by Other route 2 (two) times daily. E11.9 12/22/18   Renato Shin, MD  insulin detemir (LEVEMIR) 100 UNIT/ML FlexPen Inject 55 Units into the skin every morning. 09/15/19   Renato Shin, MD  Insulin Pen  Needle 31G X 5 MM MISC Inject 1 each into the skin 2 (two) times daily. Use to inject insulin twice daily; E11.29 Patient taking differently: Inject 1 each into the skin 2 (two) times daily. E11.29 03/05/19   Renato Shin, MD  omeprazole (PRILOSEC) 40 MG capsule TAKE 1 CAPSULE(40 MG) BY MOUTH DAILY 09/14/19   Henson, Vickie L, NP-C  simvastatin (ZOCOR) 20 MG tablet Take 1 tablet by mouth every night. 09/06/19   Henson, Vickie L, NP-C  traZODone (DESYREL) 50 MG tablet Take 50 mg by mouth at bedtime.     [provider]  VICTOZA 18 MG/3ML SOPN Inject 1.8 mg subcutaneously daily. 08/12/19   Renato Shin, MD    Allergies    Patient has no known allergies.  Review of Systems   Review of Systems  Neurological: Positive for dizziness.  All other systems reviewed and are negative.  Physical Exam Updated Vital Signs BP (!) 155/125 (BP Location: Right Arm)   Pulse 70   Temp 97.9 F (36.6 C) (Oral)   Resp 19   SpO2 100%   Physical Exam Vitals and nursing note reviewed.  Constitutional:      Appearance: She is well-developed.  HENT:     Head: Normocephalic and atraumatic.  Cardiovascular:     Rate and Rhythm: Normal rate and regular rhythm.     Heart sounds: No murmur heard.   Pulmonary:     Effort: Pulmonary effort is normal. No respiratory distress.     Breath sounds: Normal breath sounds.  Abdominal:     Palpations: Abdomen is soft.     Tenderness: There is no abdominal tenderness. There is no guarding or rebound.  Musculoskeletal:        General: No tenderness.  Skin:    General: Skin is warm and dry.  Neurological:     Mental Status: She is alert and oriented to person, place, and time.     Comments: Visual fields are grossly intact. EOM I. Five out of five strength in all four extremities with sensation light touch intact in all four extremities. No pronator drift.  Psychiatric:        Behavior: Behavior normal.     ED Results / Procedures / Treatments    Labs (all labs ordered are listed, but only abnormal results are displayed) Labs Reviewed  BASIC METABOLIC PANEL - Abnormal; Notable for the following components:      Result Value   BUN 41 (*)    Creatinine, Ser 2.34 (*)    GFR calc non Af Amer 20 (*)    GFR calc Af Amer 23 (*)    All other components within normal limits  CBC - Abnormal; Notable for the following components:   RBC 2.96 (*)    Hemoglobin 8.6 (*)    HCT 27.4 (*)    RDW 16.2 (*)    All other components within normal limits  URINALYSIS, ROUTINE W REFLEX MICROSCOPIC - Abnormal; Notable for the following components:   APPearance HAZY (*)    Glucose, UA 50 (*)    Leukocytes,Ua LARGE (*)    Bacteria, UA RARE (*)    All other components within normal limits  CBC WITH DIFFERENTIAL/PLATELET - Abnormal; Notable for the following components:   RBC 3.35 (*)    Hemoglobin 9.9 (*)    HCT 30.2 (*)    RDW 16.2 (*)    All other components within normal limits  CBG MONITORING, ED - Abnormal; Notable for the following components:   Glucose-Capillary 111 (*)    All other components within normal limits  SARS CORONAVIRUS 2 BY RT PCR (HOSPITAL ORDER, [facility])  POC OCCULT BLOOD, ED    EKG EKG Interpretation  Date/Time:  Sunday September 26 2019 10:06:32 EDT Ventricular Rate:  70 PR Interval:    QRS Duration: 148 QT Interval:  464 QTC Calculation: 501 R Axis:   56 Text Interpretation: Sinus rhythm Right bundle branch block Artifact in lead(s) I III aVR aVL aVF Confirmed by Quintella Reichert [phone]) on 09/26/2019 12:00:59 PM   Radiology DG Chest 2 View  Result Date: 09/26/2019 CLINICAL DATA:  Dizziness and weakness EXAM: CHEST - 2 VIEW COMPARISON:  Chest radiograph dated 08/10/2019 FINDINGS: The heart size and mediastinal contours are within normal limits. There is mild left basilar atelectasis. The right lung is clear. There is no pleural  effusion or pneumothorax. The visualized skeletal  structures are unremarkable. IMPRESSION: Mild left basilar atelectasis. Electronically Signed   By: Zerita Boers M.D.   On: 09/26/2019 11:19   CT Head Wo Contrast  Result Date: 09/26/2019 CLINICAL DATA:  Vertigo.  Dizziness and confusion. EXAM: CT HEAD WITHOUT CONTRAST TECHNIQUE: Contiguous axial images were obtained from the base of the skull through the vertex without intravenous contrast. COMPARISON:  MRI brain 09/03/2019 FINDINGS: Brain: No evidence of acute infarction, hemorrhage, hydrocephalus, extra-axial collection or mass lesion/mass effect. Remote lacunar infarct identified within the right basal ganglia. There is mild diffuse low-attenuation within the subcortical and periventricular white matter compatible with chronic microvascular disease. Vascular: No hyperdense vessel or unexpected calcification. Skull: Normal. Negative for fracture or focal lesion. Sinuses/Orbits: No acute finding. Other: None IMPRESSION: 1. No acute intracranial abnormalities. 2. Chronic small vessel ischemic change and brain atrophy. Electronically Signed   By: Kerby Moors M.D.   On: 09/26/2019 10:58   CT Renal Stone Study  Result Date: 09/26/2019 CLINICAL DATA:  Flank pain.  Evaluate for renal stone. EXAM: CT ABDOMEN AND PELVIS WITHOUT CONTRAST TECHNIQUE: Multidetector CT imaging of the abdomen and pelvis was performed following the standard protocol without IV contrast. COMPARISON:  Jun 22, 2008 FINDINGS: Lower chest: Chronic opacity in the left base containing calcifications and some associated bronchiectasis, stable since 2010, of no acute significance. Hepatobiliary: No focal liver abnormality is seen. No gallstones, gallbladder wall thickening, or biliary dilatation. Pancreas: Unremarkable. No pancreatic ductal dilatation or surrounding inflammatory changes. Spleen: Normal in size without focal abnormality. Adrenals/Urinary Tract: Adrenal glands are unremarkable. Kidneys are normal, without renal calculi, focal  lesion, or hydronephrosis. Bladder is unremarkable. Stomach/Bowel: Stomach is within normal limits. Appendix appears normal. No evidence of bowel wall thickening, distention, or inflammatory changes. Vascular/Lymphatic: Calcified atherosclerosis in the nonaneurysmal aorta, extending into the iliac and femoral vessels. No adenopathy. Reproductive: Status post hysterectomy. No adnexal masses. Other: No free air free fluid. Calcified nodes in the region of the porta hepatis of no acute significance. Subcutaneous increased attenuation in the anterior abdominal wall with some irregularity to the skin as seen on series 3, image 42. Musculoskeletal: No acute or significant osseous findings. IMPRESSION: 1. No renal stones.  No cause for flank pain identified. 2. Chronic opacity in left lung base of no acute significance. 3. Calcified atherosclerosis in the nonaneurysmal aorta, extending into the iliac and femoral vessels. 4. Calcified nodes in the region the porta hepatis of no acute significance. 5. Increased attenuation in the subcutaneous fat of the anterior abdominal wall with some mild skin irregularity. This is unlikely related to the patient's symptoms. Recommend clinical correlation. Electronically Signed   By: Dorise Bullion III M.D   On: 09/26/2019 14:37    Procedures Procedures (including critical care time)  Medications Ordered in ED Medications  sodium chloride 0.9 % bolus 500 mL (500 mLs Intravenous New Bag/Given 09/26/19 1254)    ED Course  I have reviewed the triage vital signs and the nursing notes.  Pertinent labs & imaging results that were available during my care of the patient were reviewed by me and considered in my medical decision making (see chart for details).    MDM Rules/Calculators/A&P                         Patient here for evaluation of acute on chronic dizziness, nausea today. She has been vaccinated for COVID-19 but did have a sick  contact. On evaluation she is non-toxic  appearing with no focal neurologic deficits. She does not have any vertigo in the department. Presentation is not consistent with CVA, PE, dissection. She did have transient abdominal pain during her ED stay in a CT stone study was obtained, which was negative for acute process. UA is not consistent with UTI. BMP with chronic renal insufficiency, similar when compared to priors. Patient is able to eat and ambulate without difficulty in the department. Discussed with patient home care for dizziness, dehydration. Discussed outpatient follow-up and return precautions.  Final Clinical Impression(s) / ED Diagnoses Final diagnoses:  Dizziness  Dehydration    Rx / DC Orders ED Discharge Orders    None       Quintella Reichert, MD 09/26/19 1506

## 2019-09-28 ENCOUNTER — Other Ambulatory Visit: Payer: Self-pay

## 2019-09-28 NOTE — Patient Outreach (Signed)
[facility]) Care Management  09/28/2019  Courtney Goodman 1945/04/02 [id number]   Telephone call to Lynwood Dawley, RN with [facility].  She expressed some concern about patient and recent ED visit for dehydration. She is concerned about patient overall well being and care. She states she made an APS referral and it waiting for follow up. She states patient did not have all the medications she needed and that [facility] is now involved for pill packs.  Advised that CM would touch base with daughter to follow up and discuss possible PCS services for patient. Patient also has [facility] involved and that she has asked for recertification for longer time with home health to help fill the gaps.    Telephone call to daughter [name] for follow up. She states patient is doing better and that she found out about [facility] providing medications to patient.  Discussed patient care and gaps in care. Discussed possible PCS services for patient. She is agreeable to social work to assist in obtaining [place].  She confirms also that [facility] is involved coming about 3/week.  Daughter needed to end call.  Advised that CM would call again early next month for follow up but to be looking for social work to call about assistance with PCS.  She verbalized understanding.    Jone Baseman, RN, MSN [facility] Care Management Coordinator Direct Line [phone] Cell [phone] Toll Free: [phone]  Fax: [phone]

## 2019-09-29 ENCOUNTER — Other Ambulatory Visit: Payer: Self-pay

## 2019-09-29 ENCOUNTER — Other Ambulatory Visit: Payer: Self-pay | Admitting: *Deleted

## 2019-09-29 ENCOUNTER — Encounter: Payer: Self-pay | Admitting: *Deleted

## 2019-09-29 ENCOUNTER — Encounter: Payer: Self-pay | Admitting: Family Medicine

## 2019-09-29 ENCOUNTER — Ambulatory Visit (INDEPENDENT_AMBULATORY_CARE_PROVIDER_SITE_OTHER): Payer: Medicare HMO | Admitting: Family Medicine

## 2019-09-29 VITALS — BP 104/60 | HR 64 | Wt 154.2 lb

## 2019-09-29 DIAGNOSIS — I10 Essential (primary) hypertension: Secondary | ICD-10-CM | POA: Diagnosis not present

## 2019-09-29 DIAGNOSIS — I2510 Atherosclerotic heart disease of native coronary artery without angina pectoris: Secondary | ICD-10-CM | POA: Diagnosis not present

## 2019-09-29 DIAGNOSIS — E1122 Type 2 diabetes mellitus with diabetic chronic kidney disease: Secondary | ICD-10-CM | POA: Diagnosis not present

## 2019-09-29 DIAGNOSIS — J449 Chronic obstructive pulmonary disease, unspecified: Secondary | ICD-10-CM | POA: Diagnosis not present

## 2019-09-29 DIAGNOSIS — E782 Mixed hyperlipidemia: Secondary | ICD-10-CM

## 2019-09-29 DIAGNOSIS — Z79899 Other long term (current) drug therapy: Secondary | ICD-10-CM

## 2019-09-29 DIAGNOSIS — E559 Vitamin D deficiency, unspecified: Secondary | ICD-10-CM | POA: Diagnosis not present

## 2019-09-29 DIAGNOSIS — Z9114 Patient's other noncompliance with medication regimen: Secondary | ICD-10-CM

## 2019-09-29 DIAGNOSIS — G3184 Mild cognitive impairment, so stated: Secondary | ICD-10-CM | POA: Diagnosis not present

## 2019-09-29 DIAGNOSIS — E1121 Type 2 diabetes mellitus with diabetic nephropathy: Secondary | ICD-10-CM

## 2019-09-29 DIAGNOSIS — E1169 Type 2 diabetes mellitus with other specified complication: Secondary | ICD-10-CM

## 2019-09-29 DIAGNOSIS — E7849 Other hyperlipidemia: Secondary | ICD-10-CM | POA: Diagnosis not present

## 2019-09-29 DIAGNOSIS — R630 Anorexia: Secondary | ICD-10-CM | POA: Diagnosis not present

## 2019-09-29 DIAGNOSIS — N1831 Chronic kidney disease, stage 3a: Secondary | ICD-10-CM | POA: Diagnosis not present

## 2019-09-29 DIAGNOSIS — Z748 Other problems related to care provider dependency: Secondary | ICD-10-CM

## 2019-09-29 DIAGNOSIS — Z794 Long term (current) use of insulin: Secondary | ICD-10-CM

## 2019-09-29 NOTE — Progress Notes (Signed)
   Subjective:    Patient ID: Cyann Venti Kraszewski, female    DOB: [birth year], 74 y.o.   MRN: [id number]  HPI Chief Complaint  Patient presents with  . med check    med check.    She is here today for a medication management visit.  She has several specialists including a psychiatrist, neurologist, nephrologist, oncologist, podiatrist and endocrinologist.   States her grandson brought her today. States this is the grandson whom she used to be "afraid of". States she didn't have anyone else to bring her today. He has Covid. She tested negative 3 days ago.   Lives alone now. States her daughter [name] moved out.  States her daughter [name] visits her some days.   She is having issues taking the correct medications.  States Jackquline [place] is coming today to bring all new medications and to help her with how to take them appropriately.  She has tried [facility] in the past but had difficulty with this.   States she does not want to go to a care facility.   Admits that she does not eat often. States she has a poor appetite. States she was started on medication for her appetite. She cannot tell me who started her on this medication.   Recent ED visit for dizziness.  States she feels at her baseline today.   Denies fever, chills, dizziness, chest pain, palpitations, shortness of breath, abdominal pain, N/V/D, urinary symptoms, LE edema.   Reviewed allergies, medications, past medical, surgical, family, and social history.   Review of Systems Pertinent positives and negatives in the history of present illness.     Objective:   Physical Exam BP 104/60   Pulse 64   Wt 154 lb 3.2 oz (69.9 kg)   BMI 26.47 kg/m   Alert and in no distress. Cardiac exam shows a regular sinus rhythm without murmurs or gallops. Lungs are clear to auscultation.       Assessment & Plan:  Medication management  Medication noncompliance due to cognitive impairment  Assistance needed with  transportation  Essential hypertension  Mixed hyperlipidemia due to type 2 diabetes mellitus ([place])  Type 2 diabetes mellitus with stage 3a chronic kidney disease, with long-term current use of insulin ([facility])  Poor appetite  Per [facility] note, APS has been notified of her situation.  She reports feeling at baseline today.  Denies any thoughts of self harm.  I am concerned that her grandson who is positive for Covid drove her to her visit today and apparently is staying in her house currently.  Encouraged her to ask [facility] for assistance with transportation to her medical appointments in the future.  Reviewed recent labs from ED visit.  Reviewed notes from Dr. Nicole Kindred.  In my opinion she needs assistance for her ADLs including medications. I will fill out the PCS form

## 2019-09-29 NOTE — Patient Outreach (Signed)
[facility]) Care Management  09/29/2019  Courtney Goodman 10-15-1945 [id number]   CSW made an initial attempt to try and contact patient today to perform the phone assessment, as well as assess and assist with social work needs and services, without success.  A HIPAA compliant message was left for patient on voicemail.  CSW is currently awaiting a return call.  CSW will make a second outreach attempt within the next 3-4 business days, if a return call is not received from patient in the meantime.  CSW will also mail a Patient Unsuccessful Outreach Letter to patient's home, requesting that she contact CSW if she is interested in receiving social work services through [place] with [facility].  Nat Christen, BSW, MSW, LCSW  Licensed Education officer, environmental Health System  Mailing [place] [address], [place], [place] [address] Physical Address-[address], [place], [place] [address] Toll Free Main # [phone] Fax # [phone] Cell # [phone]  [email or web address]

## 2019-09-30 ENCOUNTER — Ambulatory Visit: Payer: Medicare HMO

## 2019-09-30 ENCOUNTER — Ambulatory Visit: Payer: Medicare HMO | Admitting: *Deleted

## 2019-10-04 DIAGNOSIS — N1831 Chronic kidney disease, stage 3a: Secondary | ICD-10-CM | POA: Diagnosis not present

## 2019-10-04 DIAGNOSIS — G3184 Mild cognitive impairment, so stated: Secondary | ICD-10-CM | POA: Diagnosis not present

## 2019-10-04 DIAGNOSIS — E7849 Other hyperlipidemia: Secondary | ICD-10-CM | POA: Diagnosis not present

## 2019-10-04 DIAGNOSIS — E559 Vitamin D deficiency, unspecified: Secondary | ICD-10-CM | POA: Diagnosis not present

## 2019-10-04 DIAGNOSIS — E1169 Type 2 diabetes mellitus with other specified complication: Secondary | ICD-10-CM | POA: Diagnosis not present

## 2019-10-04 DIAGNOSIS — I10 Essential (primary) hypertension: Secondary | ICD-10-CM | POA: Diagnosis not present

## 2019-10-04 DIAGNOSIS — E1122 Type 2 diabetes mellitus with diabetic chronic kidney disease: Secondary | ICD-10-CM | POA: Diagnosis not present

## 2019-10-04 DIAGNOSIS — I2510 Atherosclerotic heart disease of native coronary artery without angina pectoris: Secondary | ICD-10-CM | POA: Diagnosis not present

## 2019-10-04 DIAGNOSIS — J449 Chronic obstructive pulmonary disease, unspecified: Secondary | ICD-10-CM | POA: Diagnosis not present

## 2019-10-05 ENCOUNTER — Encounter: Payer: Medicare HMO | Admitting: Counselor

## 2019-10-05 ENCOUNTER — Encounter: Payer: Self-pay | Admitting: *Deleted

## 2019-10-05 ENCOUNTER — Other Ambulatory Visit: Payer: Self-pay

## 2019-10-05 ENCOUNTER — Other Ambulatory Visit: Payer: Self-pay | Admitting: *Deleted

## 2019-10-05 ENCOUNTER — Telehealth: Payer: Self-pay | Admitting: Counselor

## 2019-10-05 NOTE — Telephone Encounter (Signed)
Courtney Goodman had a 10:00am appointment with me today for follow up from her Neuropsychological testing results. The testing was not completed so the appointment is not needed. I called her daughter [name] at [phone]) to inform her and she was not available and I couldn't leave a message. I called the patient at her main number and left a message. They were supposed to coordinate a day/time when the patient's daughter could monitor her to assure adequate blood glucose control so that we can get the testing done. I instructed her to call back to schedule the testing.

## 2019-10-05 NOTE — Patient Outreach (Signed)
[facility]) Care Management  10/05/2019  Beyonca Wisz [place] [birth year] [id number]  CSW was able to make initial contact with patient today to perform phone assessment, as well as assess and assist with social work needs and services.  CSW introduced self, explained role and types of services provided through [facility] ([facility]).  CSW further explained to patient that CSW works with patient's Telephonic RNCM, also with [facility], Dionne Le[place].  CSW then explained the reason for the call, indicating that Mrs. Dia Sitter thought that patient would benefit from social work services and resources to assist with arranging in-home care services.  CSW obtained two HIPAA compliant identifiers from patient, which included patient's name and date of birth.  Patient stated, "I could really use some help in the home".  CSW is aware that patient currently lives at home alone, and is able to perform all activities of daily living independently, but would like assistance with meal preparation, grocery shopping, light housekeeping duties, etc.  CSW was able to confirm with patient that she is active with Adult Medicaid, through the [place].  CSW explained to patient that because she is an Adult Medicaid recipient, she is eligible to apply for [employer] Youth worker), through [employer], as well as CAPS Forensic scientist), through the [facility].  CSW agreed to mail patient the following list of resources and applications:  PCS Youth worker) Instructions; PCS (Personal Care Services) Application; PCS (Personal Care Services) Providers; CAPS Forensic scientist) Programmer, systems; CAPS Forensic scientist) Application.  CSW then agreed to follow-up with patient next week, on Tuesday, October 12, 2019, around 9:00am, to ensure that she received the information mailed to her by CSW, as well as assist with application completion.  Patient voiced understanding and was agreeable to this plan.  CSW was able to confirm that patient has the correct contact information for CSW, encouraging her to call directly if social work needs arise in the meantime.    Nat Christen, BSW, MSW, LCSW  Licensed Education officer, environmental Health System  Mailing [place] [address], [place], [place] [address] Physical Address-[address] [place], [place] [address] Toll Free Main # [phone] Fax # [phone] Cell # [phone]  [email or web address]

## 2019-10-06 ENCOUNTER — Ambulatory Visit: Payer: Medicare HMO

## 2019-10-07 ENCOUNTER — Other Ambulatory Visit: Payer: Self-pay

## 2019-10-07 NOTE — Patient Outreach (Signed)
[facility]) Care Management  [place]  10/07/2019   Courtney Goodman 07-Nov-1945 [id number]  Subjective: Telephone call to daughter [name].  She reports patient doing a lot better.  She states that patient is eating a drinking regularly. She reports pharmacy change to [facility] and patient has been compliant with medications.  She reports that patient's blood sugars are better. She reports no highs. She is not able to give numbers except to say that they are better.  Discussed continued monitoring of blood sugars and limiting carbohydrates.  She verbalized understanding.  Discussed social work call to patient and that resources for [facility] will be sent to the home.  She verbalized understanding and will be on the lookout for those resources.  She voices no concerns.     Objective:   Encounter Medications:  Outpatient Encounter Medications as of 10/07/2019  Medication Sig  . albuterol (PROVENTIL HFA;VENTOLIN HFA) 108 (90 Base) MCG/ACT inhaler Inhale 2 puffs into the lungs every 6 (six) hours as needed for wheezing or shortness of breath.  . Alcohol Swabs (ALCOHOL PREP) PADS Use for testing (Patient taking differently: 1 each by Other route 2 (two) times daily. E11.9)  . amLODipine-benazepril (LOTREL) 5-20 MG capsule Take 1 capsule by mouth daily.  Marland Kitchen aspirin EC 81 MG tablet Take 81 mg by mouth daily.   . Biotin 10000 MCG TABS Take 1 tablet by mouth daily.   . Blood Glucose Monitoring Suppl (TRUE METRIX METER) w/Device KIT 1 each by Does not apply route 2 (two) times daily. Use to monitor glucose levels BID; E11.29 (Patient taking differently: 1 each by Does not apply route 2 (two) times daily. E11.29)  . carvedilol (COREG) 6.25 MG tablet Take 1 tablet by mouth twice daily.  . Cholecalciferol 25 MCG (1000 UT) tablet Take 1 tablet by mouth daily.  . citalopram (CELEXA) 20 MG tablet Take 1 tablet (20 mg total) by mouth daily. (Patient taking differently: Take 30 mg by mouth daily. )   . fenofibrate (TRICOR) 145 MG tablet Take 1 tablet by mouth daily.  . fluticasone (FLONASE) 50 MCG/ACT nasal spray Place 2 sprays into both nostrils daily.  . folic acid (FOLVITE) 1 MG tablet Take 1 tablet (1 mg total) by mouth daily.  Marland Kitchen glucose blood (TRUE METRIX BLOOD GLUCOSE TEST) test strip 1 each by Other route 2 (two) times daily. use for testing (Patient taking differently: 1 each by Other route 2 (two) times daily. E11.9)  . insulin detemir (LEVEMIR) 100 UNIT/ML FlexPen Inject 55 Units into the skin every morning.  . Insulin Pen Needle 31G X 5 MM MISC Inject 1 each into the skin 2 (two) times daily. Use to inject insulin twice daily; E11.29 (Patient taking differently: Inject 1 each into the skin 2 (two) times daily. E11.29)  . mirtazapine (REMERON) 15 MG tablet Take 7.5 mg by mouth at bedtime.  Marland Kitchen omeprazole (PRILOSEC) 40 MG capsule TAKE 1 CAPSULE(40 MG) BY MOUTH DAILY  . simvastatin (ZOCOR) 20 MG tablet Take 1 tablet by mouth every night.  . traZODone (DESYREL) 50 MG tablet Take 50 mg by mouth at bedtime.   Marland Kitchen VICTOZA 18 MG/3ML SOPN Inject 1.8 mg subcutaneously daily.   No facility-administered encounter medications on file as of 10/07/2019.    Functional Status:  In your present state of health, do you have any difficulty performing the following activities: 10/07/2019 10/05/2019  Hearing? Y Y  Comment - Hard of hearing, wears hearing aides.  Vision? N  N  Difficulty concentrating or making decisions? N N  Walking or climbing stairs? N N  Dressing or bathing? Y N  Comment tiredness -  Doing errands, shopping? Aggie Moats  Comment daughter handles -  Conservation officer, nature and eating ? N N  Using the Toilet? N N  In the past six months, have you accidently leaked urine? N N  Do you have problems with loss of bowel control? N N  Managing your Medications? Y N  Comment patient forgetful -  Managing your Finances? Y N  Comment family assists -  Housekeeping or managing your Housekeeping? Y N   Comment family assists -  Some recent data might be hidden    Fall/Depression Screening: Fall Risk  10/07/2019 10/05/2019 10/05/2019  Falls in the past year? _0 Number falls in past yr: _1 Injury with Fall? _2 Risk Factor Category  - - -  Risk for fall due to : - Impaired balance/gait;History of fall(s);Impaired mobility History of fall(s);Impaired balance/gait;Impaired mobility  Follow up - Education provided;Falls prevention discussed Education provided;Falls prevention discussed   PHQ 2/9 Scores 10/05/2019 10/05/2019 09/29/2019 07/08/2019 10/12/2018 10/08/2018 10/01/2018  PHQ - 2 Score 0 0 0 0 _3 PHQ- 9 Score - - - - 24 - -    Assessment: Patient continues to manage chronic illness with family assistance.  Patient currently waiting for resources from social work for [employer].    Plan:  [facility] CM Care Plan Problem Two     Most Recent Value  Care Plan Problem Two Knowlege deficit regarding managment of diabetes as evidenced by elevated A1C  Role Documenting the Problem Two Care Management Telephonic [place] for Problem Two Active  Interventions for Problem Two Long Term Goal  Daughter reports blood sugars are lower but unable to give numbers.  Discussed importance of blood sugar control and regular meals.   [facility] Long Term Goal Member's A1C will decrease </= 8 within the next 3 months  [facility]N Long Term Goal Start Date 10/07/19     RN CM will attempt again in the month of October and daughter agreeable.   Jone Baseman, RN, MSN [place] Management Care Management Coordinator Direct Line [phone] Cell [phone] Toll Free: [phone]  Fax: [phone]

## 2019-10-12 ENCOUNTER — Encounter (HOSPITAL_COMMUNITY): Payer: Self-pay | Admitting: *Deleted

## 2019-10-12 ENCOUNTER — Other Ambulatory Visit: Payer: Self-pay | Admitting: *Deleted

## 2019-10-12 ENCOUNTER — Emergency Department (HOSPITAL_COMMUNITY): Payer: Medicare HMO

## 2019-10-12 ENCOUNTER — Other Ambulatory Visit: Payer: Self-pay

## 2019-10-12 ENCOUNTER — Encounter: Payer: Self-pay | Admitting: *Deleted

## 2019-10-12 ENCOUNTER — Emergency Department (HOSPITAL_COMMUNITY)
Admission: EM | Admit: 2019-10-12 | Discharge: 2019-10-12 | Disposition: A | Discharge status: Home / Self Care | Payer: Medicare HMO | Attending: Emergency Medicine | Admitting: Emergency Medicine

## 2019-10-12 DIAGNOSIS — S01512A Laceration without foreign body of oral cavity, initial encounter: Secondary | ICD-10-CM

## 2019-10-12 DIAGNOSIS — W19XXXA Unspecified fall, initial encounter: Secondary | ICD-10-CM | POA: Diagnosis not present

## 2019-10-12 DIAGNOSIS — J45909 Unspecified asthma, uncomplicated: Secondary | ICD-10-CM | POA: Insufficient documentation

## 2019-10-12 DIAGNOSIS — R52 Pain, unspecified: Secondary | ICD-10-CM | POA: Diagnosis not present

## 2019-10-12 DIAGNOSIS — Z23 Encounter for immunization: Secondary | ICD-10-CM | POA: Insufficient documentation

## 2019-10-12 DIAGNOSIS — I2510 Atherosclerotic heart disease of native coronary artery without angina pectoris: Secondary | ICD-10-CM | POA: Diagnosis not present

## 2019-10-12 DIAGNOSIS — Y929 Unspecified place or not applicable: Secondary | ICD-10-CM | POA: Insufficient documentation

## 2019-10-12 DIAGNOSIS — S0993XA Unspecified injury of face, initial encounter: Secondary | ICD-10-CM | POA: Diagnosis not present

## 2019-10-12 DIAGNOSIS — Z7982 Long term (current) use of aspirin: Secondary | ICD-10-CM | POA: Insufficient documentation

## 2019-10-12 DIAGNOSIS — Y999 Unspecified external cause status: Secondary | ICD-10-CM | POA: Diagnosis not present

## 2019-10-12 DIAGNOSIS — S0083XA Contusion of other part of head, initial encounter: Secondary | ICD-10-CM

## 2019-10-12 DIAGNOSIS — Z79899 Other long term (current) drug therapy: Secondary | ICD-10-CM | POA: Diagnosis not present

## 2019-10-12 DIAGNOSIS — D649 Anemia, unspecified: Secondary | ICD-10-CM | POA: Insufficient documentation

## 2019-10-12 DIAGNOSIS — J320 Chronic maxillary sinusitis: Secondary | ICD-10-CM | POA: Diagnosis not present

## 2019-10-12 DIAGNOSIS — Y939 Activity, unspecified: Secondary | ICD-10-CM | POA: Insufficient documentation

## 2019-10-12 DIAGNOSIS — R531 Weakness: Secondary | ICD-10-CM | POA: Diagnosis not present

## 2019-10-12 DIAGNOSIS — I129 Hypertensive chronic kidney disease with stage 1 through stage 4 chronic kidney disease, or unspecified chronic kidney disease: Secondary | ICD-10-CM | POA: Diagnosis not present

## 2019-10-12 DIAGNOSIS — N289 Disorder of kidney and ureter, unspecified: Secondary | ICD-10-CM | POA: Diagnosis not present

## 2019-10-12 DIAGNOSIS — S00532A Contusion of oral cavity, initial encounter: Secondary | ICD-10-CM | POA: Diagnosis not present

## 2019-10-12 DIAGNOSIS — J449 Chronic obstructive pulmonary disease, unspecified: Secondary | ICD-10-CM | POA: Insufficient documentation

## 2019-10-12 DIAGNOSIS — E1129 Type 2 diabetes mellitus with other diabetic kidney complication: Secondary | ICD-10-CM | POA: Diagnosis not present

## 2019-10-12 DIAGNOSIS — I6381 Other cerebral infarction due to occlusion or stenosis of small artery: Secondary | ICD-10-CM | POA: Diagnosis not present

## 2019-10-12 DIAGNOSIS — J3489 Other specified disorders of nose and nasal sinuses: Secondary | ICD-10-CM | POA: Diagnosis not present

## 2019-10-12 DIAGNOSIS — R42 Dizziness and giddiness: Secondary | ICD-10-CM

## 2019-10-12 DIAGNOSIS — N183 Chronic kidney disease, stage 3 unspecified: Secondary | ICD-10-CM | POA: Insufficient documentation

## 2019-10-12 DIAGNOSIS — I959 Hypotension, unspecified: Secondary | ICD-10-CM | POA: Diagnosis not present

## 2019-10-12 DIAGNOSIS — I6782 Cerebral ischemia: Secondary | ICD-10-CM | POA: Diagnosis not present

## 2019-10-12 DIAGNOSIS — H748X2 Other specified disorders of left middle ear and mastoid: Secondary | ICD-10-CM | POA: Diagnosis not present

## 2019-10-12 LAB — CBC WITH DIFFERENTIAL/PLATELET
Abs Immature Granulocytes: 0.03 10*3/uL (ref 0.00–0.07)
Basophils Absolute: 0 10*3/uL (ref 0.0–0.1)
Basophils Relative: 1 %
Eosinophils Absolute: 0.2 10*3/uL (ref 0.0–0.5)
Eosinophils Relative: 2 %
HCT: 27.6 % — ABNORMAL LOW (ref 36.0–46.0)
Hemoglobin: 8.6 g/dL — ABNORMAL LOW (ref 12.0–15.0)
Immature Granulocytes: 0 %
Lymphocytes Relative: 25 %
Lymphs Abs: 2 10*3/uL (ref 0.7–4.0)
MCH: 29.5 pg (ref 26.0–34.0)
MCHC: 31.2 g/dL (ref 30.0–36.0)
MCV: 94.5 fL (ref 80.0–100.0)
Monocytes Absolute: 0.6 10*3/uL (ref 0.1–1.0)
Monocytes Relative: 8 %
Neutro Abs: 4.9 10*3/uL (ref 1.7–7.7)
Neutrophils Relative %: 64 %
Platelets: 326 10*3/uL (ref 150–400)
RBC: 2.92 MIL/uL — ABNORMAL LOW (ref 3.87–5.11)
RDW: 16 % — ABNORMAL HIGH (ref 11.5–15.5)
WBC: 7.7 10*3/uL (ref 4.0–10.5)
nRBC: 0 % (ref 0.0–0.2)

## 2019-10-12 LAB — COMPREHENSIVE METABOLIC PANEL
ALT: 11 U/L (ref 0–44)
AST: 19 U/L (ref 15–41)
Albumin: 3.4 g/dL — ABNORMAL LOW (ref 3.5–5.0)
Alkaline Phosphatase: 37 U/L — ABNORMAL LOW (ref 38–126)
Anion gap: 9 (ref 5–15)
BUN: 39 mg/dL — ABNORMAL HIGH (ref 8–23)
CO2: 24 mmol/L (ref 22–32)
Calcium: 9.9 mg/dL (ref 8.9–10.3)
Chloride: 106 mmol/L (ref 98–111)
Creatinine, Ser: 2.45 mg/dL — ABNORMAL HIGH (ref 0.44–1.00)
GFR calc Af Amer: 22 mL/min — ABNORMAL LOW (ref >60)
GFR calc non Af Amer: 19 mL/min — ABNORMAL LOW (ref >60)
Glucose, Bld: 200 mg/dL — ABNORMAL HIGH (ref 70–99)
Potassium: 4.2 mmol/L (ref 3.5–5.1)
Sodium: 139 mmol/L (ref 135–145)
Total Bilirubin: 0.5 mg/dL (ref 0.3–1.2)
Total Protein: 7.3 g/dL (ref 6.5–8.1)

## 2019-10-12 MED ORDER — AMOXICILLIN 500 MG PO CAPS: 500.0000 mg | ORAL_CAPSULE | Freq: Two times a day (BID) | ORAL | 0 refills | Status: DC

## 2019-10-12 MED ORDER — TETANUS-DIPHTH-ACELL PERTUSSIS 5-2.5-18.5 LF-MCG/0.5 IM SUSP
0.5000 mL | Freq: Once | INTRAMUSCULAR | Status: AC
  Administered 2019-10-12: 0.5 mL via INTRAMUSCULAR
  Filled 2019-10-12: qty 0.5

## 2019-10-12 MED ORDER — LIDOCAINE-EPINEPHRINE 1 %-1:100000 IJ SOLN
10.0000 mL | Freq: Once | INTRAMUSCULAR | Status: AC
  Administered 2019-10-12: 10 mL
  Filled 2019-10-12: qty 1

## 2019-10-12 MED ORDER — AMOXICILLIN 500 MG PO CAPS
500.0000 mg | ORAL_CAPSULE | Freq: Once | ORAL | Status: AC
  Administered 2019-10-12: 500 mg via ORAL
  Filled 2019-10-12: qty 1

## 2019-10-12 MED ORDER — LORAZEPAM 2 MG/ML IJ SOLN: 1.0000 mg | Freq: Once | INTRAMUSCULAR | Status: DC

## 2019-10-12 NOTE — Discharge Instructions (Signed)
Your MRI today did not show evidence of stroke.  Your dizziness is likely related to vertigo.  Please rest and stay hydrated.  Your laceration was repaired by the previous team and they wanted you to have antibiotics at discharge.  Please take the antibiotics as directed.  Please follow-up with your primary doctor.  If any symptoms change or worsen, was return to the nearest emergency department.

## 2019-10-12 NOTE — Patient Outreach (Signed)
[facility]) Care Management  10/12/2019  Charl Wellen Ornstein [birth year] [id number]   CSW was able to make contact with patient's daughter, [name] today, to follow-up regarding social work services and resources for patient, as well as to confirm that patient received the packet of resource information and applications, mailed to her home by CSW last week, on Tuesday, October 05, 2019.  Mrs. Wynetta Emery confirmed receipt, indicating that she is already completing the application for PCS ([place]), through [facility], and hopes to have it submitted for processing by the end of the week.  Mrs. Wynetta Emery went on to say that she is going to wait to see if patient is approved for PCS before applying for CAPS Forensic scientist), through the [place].  Mrs. Wynetta Emery denied needing assistance with application completion or submission, nor did she have any questions about the application itself, or the application process.  CSW will perform a case closure on patient, as all goals of treatment have been met from social work standpoint and no additional social work needs have been identified at this time.  CSW will notify patient's Telephonic RNCM with [facility], [name] of CSW's plans to close patient's case.  CSW will fax an update to patient's Primary Care Physician, [name] to ensure that she is aware of CSW's involvement with patient's plan of care, in addition to routing Dr. Raenette Rover a Physician Case Closure Letter.  CSW was able to confirm that patient and Mrs. Wynetta Emery have the correct contact information for CSW, encouraging them to contact CSW directly if additional social work needs arise in the near future, or if they change their minds about needing assistance with completion of the application for PCS.  Mrs. Wynetta Emery voiced understanding and was agreeable to  this plan.  Nat Christen, BSW, MSW, LCSW  Licensed Education officer, [facility]  [place] [address] [place], [place] [address] Physical Address-[address] [place], [place] [address] Toll Free Main # [phone] Fax # [phone] Cell # [phone]  [email or web address]

## 2019-10-12 NOTE — ED Provider Notes (Signed)
7:44 AM Care assumed with Dr. Roxanne Mins.  At time of transfer care, patient is awaiting results of MRI to rule out a central cause of her vertiginous symptoms and dizziness.  Patient had laceration repaired by previous provider.  Plan of care is to discharge patient with antibiotics for the oral laceration and outpatient follow-up if her MRI is reassuring.  MRI showed no acute stroke.  Old stroke was seen and discussed with patient.  She is feeling much better and was able to ambulate stable to the bathroom without instability or dizziness or lightheadedness.  Patient will be discharged home with antibiotics for her laceration care.  Patient follow-up with PCP.  She understood return precautions and follow-up instructions.  Patient discharged in good condition.   Clinical Impression: 1. Fall at home, initial encounter   2. Contusion of jaw, initial encounter   3. Intraoral laceration, initial encounter   4. Renal insufficiency   5. Normochromic normocytic anemia   6. Dizziness     Disposition: Discharge  Condition: Good  I have discussed the results, Dx and Tx plan with the pt(& family if present). He/she/they expressed understanding and agree(s) with the plan. Discharge instructions discussed at great length. Strict return precautions discussed and pt &/or family have verbalized understanding of the instructions. No further questions at time of discharge.    Discharge Medication List as of 10/12/2019  8:48 AM     START taking these medications   Details  amoxicillin (AMOXIL) 500 MG capsule Take 1 capsule (500 mg total) by mouth 2 (two) times daily., Starting Tue 10/12/2019, Print        Follow Up: [name], NP-C [place] [address] [phone]     [facility] EMERGENCY DEPARTMENT [address] [phone] mc [place]       Aleyah Balik, Gwenyth Allegra, MD 10/12/19 [phone]

## 2019-10-12 NOTE — ED Triage Notes (Signed)
Pt arrived by [facility] for recent falls. [facility]ms has been called multiple times over past two days for falls. Per family, pt normally ambulatory and possibly having syncopal episodes. Denies cp or sob, only complaint is fatigue and weakness.

## 2019-10-12 NOTE — ED Notes (Signed)
Patient verbalizes understanding of discharge instructions. Opportunity for questioning and answers were provided. Armband removed by staff, pt discharged from ED.  

## 2019-10-12 NOTE — ED Provider Notes (Signed)
[facility] EMERGENCY DEPARTMENT Provider Note   CSN: [id number] Arrival date & time: 10/12/19  0458    History Chief Complaint  Patient presents with  . Loss of Consciousness    Courtney Goodman is a 74 y.o. female.  The history is provided by the patient.  Loss of Consciousness She has history of hypertension, diabetes, hyperlipidemia, chronic kidney disease, coronary artery disease, COPD and comes in because of dizziness.  She states that yesterday morning, she visited her neighbor and when coming home, she fell because her left side gave out on her.  She lay on the ground and her neighbor called for an ambulance and she was helped back into her home.  Since then, she has noted that her jaw is sore.  This morning, she woke up and there was blood in her mouth.  Of note, her neighbor thought that she was walking like she was drunk.  Patient has been using her walker inside her home.  She does admit to feeling dizzy as if she is drunk.  She denies tinnitus.  She has chronic hearing loss in the left ear which is unchanged.  Last tetanus immunization is unknown.  Apparently, there have been additional falls for which EMS has been called.  Past Medical History:  Diagnosis Date  . Anemia of chronic disease   . Asthma   . Atherosclerotic heart disease of native coronary artery without angina pectoris   . CKD (chronic kidney disease)   . COPD (chronic obstructive pulmonary disease) ([place])   . DM (diabetes mellitus), type 2 with renal complications ([place])   . Dysrhythmia   . Elevated ferritin 01/03/2017  . GAD (generalized anxiety disorder)   . Hypertension   . Mixed hyperlipidemia due to type 2 diabetes mellitus ([place])   . Mixed incontinence    per medical records from [place]  . Osteopenia   . Personal history of noncompliance with medical treatment, presenting hazards to health   . Shortness of breath dyspnea   . Suicidal ideation   . TB (tuberculosis), treated    age  61  . Vitamin D deficiency     Patient Active Problem List   Diagnosis Date Noted  . Poor appetite 09/29/2019  . Assistance needed with transportation 09/29/2019  . Medication noncompliance due to cognitive impairment 10/12/2018  . Severe episode of recurrent major depressive disorder, without psychotic features ([place]) 10/12/2018  . Acute renal failure superimposed on stage 3 chronic kidney disease ([place]) 09/22/2018  . Leukocytosis 09/22/2018  . Non-intractable vomiting   . GERD (gastroesophageal reflux disease) 08/23/2017  . CAD (coronary artery disease) 08/23/2017  . Hypoglycemia 08/23/2017  . Acute metabolic encephalopathy 01/30/7251  . Hypokalemia 08/23/2017  . Type II diabetes mellitus with renal manifestations ([place]) 08/23/2017  . Elevated ferritin 01/03/2017  . Personal history of noncompliance with medical treatment, presenting hazards to health   . Urinary incontinence, mixed 05/27/2016  . Major depression, recurrent ([place]) 05/27/2016  . Atherosclerotic heart disease of native coronary artery without angina pectoris 05/27/2016  . GAD (generalized anxiety disorder)   . Anemia of chronic disease   . Mixed hyperlipidemia due to type 2 diabetes mellitus ([place])   . Diabetes ([place]) 11/30/2015  . Vitamin D deficiency 11/23/2015  . HTN (hypertension) 11/23/2015  . Anemia 11/23/2015  . CKD (chronic kidney disease), stage III 11/23/2015  . Dysrhythmia   . COPD (chronic obstructive pulmonary disease) ([place])     Past Surgical History:  Procedure Laterality Date  .  ANKLE RECONSTRUCTION     right  . CARPAL TUNNEL RELEASE    . CORONARY ANGIOPLASTY       OB History   No obstetric history on file.     Family History  Problem Relation Age of Onset  . Diabetes Mother     Social History   Tobacco Use  . Smoking status: Former Research scientist (life sciences)  . Smokeless tobacco: Never Used  Vaping Use  . Vaping Use: Never used  Substance Use Topics  . Alcohol use: No  . Drug use: No    Home  Medications Prior to Admission medications   Medication Sig Start Date End Date Taking? Authorizing Provider  albuterol (PROVENTIL HFA;VENTOLIN HFA) 108 (90 Base) MCG/ACT inhaler Inhale 2 puffs into the lungs every 6 (six) hours as needed for wheezing or shortness of breath. 04/14/17   Henson, Vickie L, NP-C  Alcohol Swabs (ALCOHOL PREP) PADS Use for testing Patient taking differently: 1 each by Other route 2 (two) times daily. E11.9 08/07/18   Henson, Vickie L, NP-C  amLODipine-benazepril (LOTREL) 5-20 MG capsule Take 1 capsule by mouth daily. 09/06/19   Henson, Vickie L, NP-C  aspirin EC 81 MG tablet Take 81 mg by mouth daily.     [provider]  Biotin 10000 MCG TABS Take 1 tablet by mouth daily.     [provider]  Blood Glucose Monitoring Suppl (TRUE METRIX METER) w/Device KIT 1 each by Does not apply route 2 (two) times daily. Use to monitor glucose levels BID; E11.29 Patient taking differently: 1 each by Does not apply route 2 (two) times daily. E11.29 10/16/18   Renato Shin, MD  carvedilol (COREG) 6.25 MG tablet Take 1 tablet by mouth twice daily. 09/06/19   Henson, Vickie L, NP-C  Cholecalciferol 25 MCG (1000 UT) tablet Take 1 tablet by mouth daily. 08/11/19   Henson, Vickie L, NP-C  citalopram (CELEXA) 20 MG tablet Take 1 tablet (20 mg total) by mouth daily. Patient taking differently: Take 30 mg by mouth daily.  09/05/16   Henson, Vickie L, NP-C  fenofibrate (TRICOR) 145 MG tablet Take 1 tablet by mouth daily. 09/06/19   Henson, Vickie L, NP-C  fluticasone (FLONASE) 50 MCG/ACT nasal spray Place 2 sprays into both nostrils daily. 07/25/16   Henson, Vickie L, NP-C  folic acid (FOLVITE) 1 MG tablet Take 1 tablet (1 mg total) by mouth daily. 08/09/19   Henson, Vickie L, NP-C  glucose blood (TRUE METRIX BLOOD GLUCOSE TEST) test strip 1 each by Other route 2 (two) times daily. use for testing Patient taking differently: 1 each by Other route 2 (two) times daily. E11.9 12/22/18    Renato Shin, MD  insulin detemir (LEVEMIR) 100 UNIT/ML FlexPen Inject 55 Units into the skin every morning. 09/15/19   Renato Shin, MD  Insulin Pen Needle 31G X 5 MM MISC Inject 1 each into the skin 2 (two) times daily. Use to inject insulin twice daily; E11.29 Patient taking differently: Inject 1 each into the skin 2 (two) times daily. E11.29 03/05/19   Renato Shin, MD  mirtazapine (REMERON) 15 MG tablet Take 7.5 mg by mouth at bedtime. 08/21/19   [provider]  omeprazole (PRILOSEC) 40 MG capsule TAKE 1 CAPSULE(40 MG) BY MOUTH DAILY 09/14/19   Henson, Vickie L, NP-C  simvastatin (ZOCOR) 20 MG tablet Take 1 tablet by mouth every night. 09/06/19   Henson, Vickie L, NP-C  traZODone (DESYREL) 50 MG tablet Take 50 mg by mouth at bedtime.  [provider]  VICTOZA 18 MG/3ML SOPN Inject 1.8 mg subcutaneously daily. 08/12/19   Renato Shin, MD    Allergies    Patient has no known allergies.  Review of Systems   Review of Systems  Cardiovascular: Positive for syncope.  All other systems reviewed and are negative.   Physical Exam Updated Vital Signs BP (!) 129/104 (BP Location: Right Arm)   Pulse 63   Temp 98.2 F (36.8 C) (Temporal)   Resp 16   SpO2 100%   Physical Exam Vitals and nursing note reviewed.   74 year old female, resting comfortably and in no acute distress. Vital signs are significant for elevated diastolic blood pressure. Oxygen saturation is 100%, which is normal. Head is normocephalic. PERRLA, EOMI. there is no swelling noted to the face but there is tenderness along the right side of the mandible.  In examination of the oral cavity, there is a laceration of the right buccal mucosa. Neck is nontender and supple without adenopathy or JVD.  There are no carotid bruits. Back is nontender and there is no CVA tenderness. Lungs are clear without rales, wheezes, or rhonchi. Chest is nontender. Heart has regular rate and rhythm without murmur. Abdomen is  soft, flat, nontender without masses or hepatosplenomegaly and peristalsis is normoactive. Extremities have no cyanosis or edema, full range of motion is present. Skin is warm and dry without rash. Neurologic: Mental status is normal, cranial nerves are intact, there are no motor or sensory deficits.  There is no pronator drift.  On finger-to-nose testing, she is slow to move her arms, but no ataxia is noted.  ED Results / Procedures / Treatments   Labs (all labs ordered are listed, but only abnormal results are displayed) Labs Reviewed  CBC WITH DIFFERENTIAL/PLATELET - Abnormal; Notable for the following components:      Result Value   RBC 2.92 (*)    Hemoglobin 8.6 (*)    HCT 27.6 (*)    RDW 16.0 (*)    All other components within normal limits  COMPREHENSIVE METABOLIC PANEL - Abnormal; Notable for the following components:   Glucose, Bld 200 (*)    BUN 39 (*)    Creatinine, Ser 2.45 (*)    Albumin 3.4 (*)    Alkaline Phosphatase 37 (*)    GFR calc non Af Amer 19 (*)    GFR calc Af Amer 22 (*)    All other components within normal limits    EKG EKG Interpretation  Date/Time:  Tuesday October 12 2019 05:10:03 EDT Ventricular Rate:  63 PR Interval:    QRS Duration: 137 QT Interval:  430 QTC Calculation: 441 R Axis:   54 Text Interpretation: Sinus rhythm Right bundle branch block When compared with ECG of 09/26/2019, No significant change was found Confirmed by Delora Fuel ([id number]) on 10/12/2019 5:16:02 AM   Radiology CT Maxillofacial Wo Contrast  Result Date: 10/12/2019 CLINICAL DATA:  Facial trauma due to fall EXAM: CT MAXILLOFACIAL WITHOUT CONTRAST TECHNIQUE: Multidetector CT imaging of the maxillofacial structures was performed. Multiplanar CT image reconstructions were also generated. COMPARISON:  Head CT 09/26/2019 FINDINGS: Osseous: Negative for fracture or mandibular dislocation. Orbits: No evidence of injury Sinuses: Mucosal thickening in the right more than left  maxillary sinus with sclerotic wall thickening on the right. Soft tissues: Soft tissue stranding with buccal fat gas on the right. No radiopaque foreign body. Limited intracranial: Stable from recent head CT. Chronic lacunar infarct at the right caudate head. IMPRESSION:  1. Negative for facial fracture. 2. Right buccal soft tissue swelling with gas. 3. Chronic maxillary sinusitis. Electronically Signed   By: Monte Fantasia M.D.   On: 10/12/2019 06:26    Procedures .Marland KitchenLaceration Repair  Date/Time: 10/12/2019 6:40 AM Performed by: Delora Fuel, MD Authorized by: Delora Fuel, MD   Consent:    Consent obtained:  Verbal   Consent given by:  Patient   Risks discussed:  Infection and pain   Alternatives discussed:  No treatment Anesthesia (see MAR for exact dosages):    Anesthesia method:  Local infiltration   Local anesthetic:  Lidocaine 2% WITH epi Laceration details:    Location:  Mouth   Mouth location:  R buccal mucosa   Length (cm):  4   Depth (mm):  3 Repair type:    Repair type:  Simple Pre-procedure details:    Preparation:  Patient was prepped and draped in usual sterile fashion and imaging obtained to evaluate for foreign bodies Exploration:    Hemostasis achieved with:  Direct pressure   Wound exploration: entire depth of wound probed and visualized     Wound extent: no foreign bodies/material noted     Contaminated: no   Treatment:    Area cleansed with:  Saline   Amount of cleaning:  Standard Skin repair:    Repair method:  Sutures   Suture size:  4-0   Suture material:  Chromic gut   Number of sutures:  8 Approximation:    Approximation:  Close Post-procedure details:    Dressing:  Open (no dressing)   Patient tolerance of procedure:  Tolerated well, no immediate complications     Medications Ordered in ED Medications  amoxicillin (AMOXIL) capsule 500 mg (has no administration in time range)  LORazepam (ATIVAN) injection 1 mg (has no administration in time  range)  Tdap (BOOSTRIX) injection 0.5 mL (0.5 mLs Intramuscular Given 10/12/19 0544)  lidocaine-EPINEPHrine (XYLOCAINE W/EPI) 1 %-1:100000 (with pres) injection 10 mL (10 mLs Infiltration Given 10/12/19 0548)    ED Course  I have reviewed the triage vital signs and the nursing notes.  Pertinent labs & imaging results that were available during my care of the patient were reviewed by me and considered in my medical decision making (see chart for details).  MDM Rules/Calculators/A&P Falls that are suspicious for possible central vertigo.  Laceration of the buccal mucosa.  ECG shows right bundle branch block but no change from baseline.  Will send for maxillofacial CT to rule out facial bone fracture.  Will send for MRI to look for evidence of possible posterior circulation stroke.  Tdap booster is given.  Old records are reviewed, and she does have a prior ED visit for vertigo.  CT head obtained 8/29 showed no acute abnormalities, obtained as part of evaluation of vertigo.  MR brain on 8/6 showed no acute findings, study obtained for evaluation of memory loss.   CT shows no evidence of facial fracture, chronic maxillary sinusitis noted.  Labs show stable renal insufficiency, stable anemia.  Laceration is closed with chromic gut sutures.  MRI is pending.  Case is signed out to Dr. Sherry Ruffing pending MRI results.  She is given a dose of amoxicillin because of intraoral laceration.  Final Clinical Impression(s) / ED Diagnoses Final diagnoses:  Fall at home, initial encounter  Contusion of jaw, initial encounter  Intraoral laceration, initial encounter  Renal insufficiency  Normochromic normocytic anemia    Rx / DC Orders ED Discharge Orders  None       Delora Fuel, MD 84/16/60 [phone]

## 2019-10-12 NOTE — ED Notes (Signed)
Patient transported to MRI 

## 2019-10-12 NOTE — ED Notes (Signed)
Patient transported to CT SCAN . 

## 2019-10-20 ENCOUNTER — Other Ambulatory Visit: Payer: Self-pay | Admitting: Family Medicine

## 2019-10-20 DIAGNOSIS — Z1231 Encounter for screening mammogram for malignant neoplasm of breast: Secondary | ICD-10-CM

## 2019-10-28 ENCOUNTER — Encounter: Payer: Self-pay | Admitting: Family Medicine

## 2019-10-28 ENCOUNTER — Ambulatory Visit (INDEPENDENT_AMBULATORY_CARE_PROVIDER_SITE_OTHER): Payer: Medicare HMO | Admitting: Family Medicine

## 2019-10-28 ENCOUNTER — Other Ambulatory Visit: Payer: Self-pay

## 2019-10-28 VITALS — BP 120/70 | HR 63 | Temp 98.7°F | Wt 150.8 lb

## 2019-10-28 DIAGNOSIS — R42 Dizziness and giddiness: Secondary | ICD-10-CM

## 2019-10-28 DIAGNOSIS — R296 Repeated falls: Secondary | ICD-10-CM

## 2019-10-28 NOTE — Progress Notes (Signed)
   Subjective:    Patient ID: Courtney Goodman, female    DOB: 1945-08-21, 74 y.o.   MRN: [id number]  HPI Chief Complaint  Patient presents with  . hospital follow-up    hospital follow-up, going to be moving soon to [place] to live with granddaughter   States she is here to let me know that she has decided to move to [place] to live with her granddaughter and great grandchildren. She has been living alone and her daughters come by as often as possible to help but states she knows she needs more assistance than this.   She had a recent dizzy episode resulting in a fall and visit to the ED.  States she uses her walker more.   MRI done in ED showing an old but no acute stroke.  CT maxillofacial   She has a scab on her left elbow and left knee.   No concerns today. States she feels back to baseline.   HTN- reports taking her medications.   Diabetes- Blood sugar 118 here today per her machine.   Denies fever, chills, dizziness, chest pain, palpitations, shortness of breath, abdominal pain, N/V/D, urinary symptoms, LE edema.       Review of Systems Pertinent positives and negatives in the history of present illness.     Objective:   Physical Exam BP 120/70   Pulse 63   Temp 98.7 F (37.1 C)   Wt 150 lb 12.8 oz (68.4 kg)   BMI 25.88 kg/m   Alert and oriented and in no acute distress.  Ambulating with a normal gait.      Assessment & Plan:  Frequent falls  Dizzy spells  Reviewed recent ED notes as well as CT and MRI results. Reports feeling back to baseline. States the purpose of her visit today was to let me know that she is going to move to [place] to live with her granddaughter.  She feels that she can be better cared for by living with her granddaughter instead of living alone.  She thanked me for being her primary care provider for the past few years.   I wished her the best of luck and she may let me know if she needs refills until she gets established  with a new PCP in [place]. Discussed establishing care as soon as possible with a PCP and specialists as she is seeing several here now.

## 2019-11-04 ENCOUNTER — Other Ambulatory Visit: Payer: Self-pay

## 2019-11-04 NOTE — Patient Outreach (Signed)
[facility]) Care Management  [place]  11/04/2019   Courtney Goodman 1945/05/06 [id number]  Subjective: Spoke with daughter [name]. She states that patient is doing good right now and that she is in [place] with her granddaughter and her family for a while.  She does not have blood sugar readings but last A1c was 8 and MD goal is less than 8.  PCS is pending right now but assessment complete.    Objective:   Encounter Medications:  Outpatient Encounter Medications as of 11/04/2019  Medication Sig  . albuterol (PROVENTIL HFA;VENTOLIN HFA) 108 (90 Base) MCG/ACT inhaler Inhale 2 puffs into the lungs every 6 (six) hours as needed for wheezing or shortness of breath.  . Alcohol Swabs (ALCOHOL PREP) PADS Use for testing (Patient taking differently: 1 each by Other route 2 (two) times daily. E11.9)  . amLODipine-benazepril (LOTREL) 5-20 MG capsule Take 1 capsule by mouth daily.  Marland Kitchen amoxicillin (AMOXIL) 500 MG capsule Take 1 capsule (500 mg total) by mouth 2 (two) times daily.  Marland Kitchen aspirin EC 81 MG tablet Take 81 mg by mouth daily.   . Biotin 10000 MCG TABS Take 1 tablet by mouth daily.   . Blood Glucose Monitoring Suppl (TRUE METRIX METER) w/Device KIT 1 each by Does not apply route 2 (two) times daily. Use to monitor glucose levels BID; E11.29 (Patient taking differently: 1 each by Does not apply route 2 (two) times daily. E11.29)  . carvedilol (COREG) 6.25 MG tablet Take 1 tablet by mouth twice daily.  . Cholecalciferol 25 MCG (1000 UT) tablet Take 1 tablet by mouth daily.  . citalopram (CELEXA) 20 MG tablet Take 1 tablet (20 mg total) by mouth daily. (Patient taking differently: Take 30 mg by mouth daily. )  . fenofibrate (TRICOR) 145 MG tablet Take 1 tablet by mouth daily.  . fluticasone (FLONASE) 50 MCG/ACT nasal spray Place 2 sprays into both nostrils daily.  . folic acid (FOLVITE) 1 MG tablet Take 1 tablet (1 mg total) by mouth daily.  Marland Kitchen glucose blood (TRUE METRIX  BLOOD GLUCOSE TEST) test strip 1 each by Other route 2 (two) times daily. use for testing (Patient taking differently: 1 each by Other route 2 (two) times daily. E11.9)  . insulin detemir (LEVEMIR) 100 UNIT/ML FlexPen Inject 55 Units into the skin every morning.  . Insulin Pen Needle 31G X 5 MM MISC Inject 1 each into the skin 2 (two) times daily. Use to inject insulin twice daily; E11.29 (Patient taking differently: Inject 1 each into the skin 2 (two) times daily. E11.29)  . mirtazapine (REMERON) 15 MG tablet Take 7.5 mg by mouth at bedtime.  Marland Kitchen omeprazole (PRILOSEC) 40 MG capsule TAKE 1 CAPSULE(40 MG) BY MOUTH DAILY  . simvastatin (ZOCOR) 20 MG tablet Take 1 tablet by mouth every night.  . traZODone (DESYREL) 50 MG tablet Take 50 mg by mouth at bedtime.   Marland Kitchen VICTOZA 18 MG/3ML SOPN Inject 1.8 mg subcutaneously daily.   No facility-administered encounter medications on file as of 11/04/2019.    Functional Status:  In your present state of health, do you have any difficulty performing the following activities: 10/07/2019 10/05/2019  Hearing? Y Y  Comment - Hard of hearing, wears hearing aides.  Vision? N N  Difficulty concentrating or making decisions? N N  Walking or climbing stairs? N N  Dressing or bathing? Y N  Comment tiredness -  Doing errands, shopping? Y N  Comment daughter handles -  Preparing Food and eating ? N N  Using the Toilet? N N  In the past six months, have you accidently leaked urine? N N  Do you have problems with loss of bowel control? N N  Managing your Medications? Y N  Comment patient forgetful -  Managing your Finances? Y N  Comment family assists -  Housekeeping or managing your Housekeeping? Y N  Comment family assists -  Some recent data might be hidden    Fall/Depression Screening: Fall Risk  10/28/2019 10/07/2019 10/05/2019  Falls in the past year? 1 1 1   Number falls in past yr: 1 1 1   Injury with Fall? 1 1 1   Risk Factor Category  - - -  Risk for fall due  to : - - Impaired balance/gait;History of fall(s);Impaired mobility  Follow up - - Education provided;Falls prevention discussed   PHQ 2/9 Scores 10/05/2019 10/05/2019 09/29/2019 07/08/2019 10/12/2018 10/08/2018 10/01/2018  PHQ - 2 Score 0 0 0 0 6 1 1   PHQ- 9 Score - - - - 24 - -    Assessment:  Goals Addressed            This Visit's Progress   . Monitor and Manage My Blood Sugar       Follow Up Date 01/27/20   - check blood sugar at prescribed times - enter blood sugar readings and medication or insulin into daily log - take the blood sugar meter to all doctor visits    Why is this important?   Checking your blood sugar at home helps to keep it from getting very high or very low.  Writing the results in a diary or log helps the doctor know how to care for you.  Your blood sugar log should have the time, date and the results.  Also, write down the amount of insulin or other medicine that you take.  Other information, like what you ate, exercise done and how you were feeling, will also be helpful.     Notes: Patient keeps log of blood sugars    . Set My Target A1C       Follow Up Date 01/27/20   - set target A1C    Why is this important?   Your target A1C is decided together by you and your doctor.  It is based on several things like your age and other health issues.    Notes: MD goal of 8 or less       Plan: RN CM will follow up with daughter in the month of December and daughter agreeable.    Jone Baseman, RN, MSN [facility] Management Care Management Coordinator Direct Line [phone] Cell [phone] Toll Free: [phone]  Fax: [phone]

## 2019-11-12 ENCOUNTER — Other Ambulatory Visit: Payer: Self-pay

## 2019-11-12 ENCOUNTER — Ambulatory Visit
Admission: RE | Admit: 2019-11-12 | Discharge: 2019-11-12 | Disposition: A | Discharge status: Home / Self Care | Payer: Medicare HMO | Source: Ambulatory Visit | Attending: Family Medicine | Admitting: Family Medicine

## 2019-11-12 DIAGNOSIS — Z1231 Encounter for screening mammogram for malignant neoplasm of breast: Secondary | ICD-10-CM | POA: Diagnosis not present

## 2019-11-15 ENCOUNTER — Ambulatory Visit: Payer: Medicare HMO | Admitting: Endocrinology

## 2019-11-15 DIAGNOSIS — N1831 Chronic kidney disease, stage 3a: Secondary | ICD-10-CM

## 2019-11-16 ENCOUNTER — Other Ambulatory Visit: Payer: Self-pay | Admitting: Endocrinology

## 2019-11-16 DIAGNOSIS — Z794 Long term (current) use of insulin: Secondary | ICD-10-CM

## 2019-11-16 DIAGNOSIS — N183 Chronic kidney disease, stage 3 unspecified: Secondary | ICD-10-CM

## 2019-11-23 ENCOUNTER — Inpatient Hospital Stay: Payer: Medicare HMO

## 2019-11-23 ENCOUNTER — Encounter: Payer: Self-pay | Admitting: Internal Medicine

## 2019-11-23 ENCOUNTER — Inpatient Hospital Stay: Payer: Medicare HMO | Attending: Oncology | Admitting: Internal Medicine

## 2019-11-23 ENCOUNTER — Other Ambulatory Visit: Payer: Self-pay

## 2019-11-23 VITALS — BP 135/65 | HR 71 | Temp 97.9°F | Resp 12 | Ht 64.0 in | Wt 138.6 lb

## 2019-11-23 DIAGNOSIS — D638 Anemia in other chronic diseases classified elsewhere: Secondary | ICD-10-CM | POA: Diagnosis not present

## 2019-11-23 DIAGNOSIS — Z79899 Other long term (current) drug therapy: Secondary | ICD-10-CM | POA: Insufficient documentation

## 2019-11-23 DIAGNOSIS — D631 Anemia in chronic kidney disease: Secondary | ICD-10-CM | POA: Diagnosis not present

## 2019-11-23 DIAGNOSIS — M8580 Other specified disorders of bone density and structure, unspecified site: Secondary | ICD-10-CM | POA: Diagnosis not present

## 2019-11-23 DIAGNOSIS — J449 Chronic obstructive pulmonary disease, unspecified: Secondary | ICD-10-CM | POA: Insufficient documentation

## 2019-11-23 DIAGNOSIS — E559 Vitamin D deficiency, unspecified: Secondary | ICD-10-CM | POA: Insufficient documentation

## 2019-11-23 DIAGNOSIS — I129 Hypertensive chronic kidney disease with stage 1 through stage 4 chronic kidney disease, or unspecified chronic kidney disease: Secondary | ICD-10-CM | POA: Insufficient documentation

## 2019-11-23 DIAGNOSIS — F419 Anxiety disorder, unspecified: Secondary | ICD-10-CM | POA: Insufficient documentation

## 2019-11-23 DIAGNOSIS — E785 Hyperlipidemia, unspecified: Secondary | ICD-10-CM | POA: Diagnosis not present

## 2019-11-23 DIAGNOSIS — Z794 Long term (current) use of insulin: Secondary | ICD-10-CM | POA: Insufficient documentation

## 2019-11-23 DIAGNOSIS — E119 Type 2 diabetes mellitus without complications: Secondary | ICD-10-CM | POA: Insufficient documentation

## 2019-11-23 DIAGNOSIS — Z7982 Long term (current) use of aspirin: Secondary | ICD-10-CM | POA: Diagnosis not present

## 2019-11-23 DIAGNOSIS — N183 Chronic kidney disease, stage 3 unspecified: Secondary | ICD-10-CM | POA: Insufficient documentation

## 2019-11-23 LAB — IRON AND TIBC
Iron: 61 ug/dL (ref 41–142)
Saturation Ratios: 18 % — ABNORMAL LOW (ref 21–57)
TIBC: 338 ug/dL (ref 236–444)
UIBC: 277 ug/dL (ref 120–384)

## 2019-11-23 LAB — CBC WITH DIFFERENTIAL (CANCER CENTER ONLY)
Abs Immature Granulocytes: 0.04 10*3/uL (ref 0.00–0.07)
Basophils Absolute: 0.1 10*3/uL (ref 0.0–0.1)
Basophils Relative: 1 %
Eosinophils Absolute: 0.1 10*3/uL (ref 0.0–0.5)
Eosinophils Relative: 2 %
HCT: 27.1 % — ABNORMAL LOW (ref 36.0–46.0)
Hemoglobin: 8.8 g/dL — ABNORMAL LOW (ref 12.0–15.0)
Immature Granulocytes: 1 %
Lymphocytes Relative: 20 %
Lymphs Abs: 1.5 10*3/uL (ref 0.7–4.0)
MCH: 29.7 pg (ref 26.0–34.0)
MCHC: 32.5 g/dL (ref 30.0–36.0)
MCV: 91.6 fL (ref 80.0–100.0)
Monocytes Absolute: 0.4 10*3/uL (ref 0.1–1.0)
Monocytes Relative: 6 %
Neutro Abs: 5.7 10*3/uL (ref 1.7–7.7)
Neutrophils Relative %: 70 %
Platelet Count: 332 10*3/uL (ref 150–400)
RBC: 2.96 MIL/uL — ABNORMAL LOW (ref 3.87–5.11)
RDW: 14.6 % (ref 11.5–15.5)
WBC Count: 7.9 10*3/uL (ref 4.0–10.5)
nRBC: 0 % (ref 0.0–0.2)

## 2019-11-23 LAB — FERRITIN: Ferritin: 457 ng/mL — ABNORMAL HIGH (ref 11–307)

## 2019-11-23 MED ORDER — EPOETIN ALFA-EPBX 40000 UNIT/ML IJ SOLN
INTRAMUSCULAR | Status: AC
  Filled 2019-11-23: qty 1

## 2019-11-23 MED ORDER — EPOETIN ALFA-EPBX 40000 UNIT/ML IJ SOLN
40000.0000 [IU] | Freq: Once | INTRAMUSCULAR | Status: AC
  Administered 2019-11-23: 40000 [IU] via SUBCUTANEOUS

## 2019-11-23 NOTE — Progress Notes (Signed)
[facility] [phone]   [phone]  OFFICE PROGRESS NOTE  [name], NP-C [place] [id number]  DIAGNOSIS: Anemia of chronic disease secondary to chronic renal insufficiency  PRIOR THERAPY: None  CURRENT THERAPY: Retacrit 40,000 units subcutaneously every 2 weeks in addition to over-the-counter oral iron tablet 1 tablet every day.  INTERVAL HISTORY: Courtney Goodman 74 y.o. female returns to the clinic for follow-up visit.  The patient is feeling fine today with no concerning complaints except for fatigue.  She missed her Retacrit injection for several months because she was sick at home.  She denied having any current chest pain, shortness of breath, cough or hemoptysis.  She denied having any fever or chills.  She has no nausea, vomiting, diarrhea or constipation.  She denied having any headache or visual changes.  She is here today for evaluation with repeat CBC, iron study and ferritin.   MEDICAL HISTORY: Past Medical History:  Diagnosis Date  . Anemia of chronic disease   . Asthma   . Atherosclerotic heart disease of native coronary artery without angina pectoris   . CKD (chronic kidney disease)   . COPD (chronic obstructive pulmonary disease) ([place])   . DM (diabetes mellitus), type 2 with renal complications ([place])   . Dysrhythmia   . Elevated ferritin 01/03/2017  . GAD (generalized anxiety disorder)   . Hypertension   . Mixed hyperlipidemia due to type 2 diabetes mellitus ([place])   . Mixed incontinence    per medical records from [place]  . Osteopenia   . Personal history of noncompliance with medical treatment, presenting hazards to health   . Shortness of breath dyspnea   . Suicidal ideation   . TB (tuberculosis), treated    age 79  . Vitamin D deficiency     ALLERGIES:  has No Known Allergies.  MEDICATIONS:  Current Outpatient Medications  Medication Sig Dispense Refill  . albuterol (PROVENTIL  HFA;VENTOLIN HFA) 108 (90 Base) MCG/ACT inhaler Inhale 2 puffs into the lungs every 6 (six) hours as needed for wheezing or shortness of breath. 1 Inhaler 0  . Alcohol Swabs (ALCOHOL PREP) PADS Use for testing (Patient taking differently: 1 each by Other route 2 (two) times daily. E11.9) 100 each 1  . amLODipine-benazepril (LOTREL) 5-20 MG capsule Take 1 capsule by mouth daily. 90 capsule 0  . amoxicillin (AMOXIL) 500 MG capsule Take 1 capsule (500 mg total) by mouth 2 (two) times daily. 10 capsule 0  . aspirin EC 81 MG tablet Take 81 mg by mouth daily.     . Biotin 10000 MCG TABS Take 1 tablet by mouth daily.     . Blood Glucose Monitoring Suppl (TRUE METRIX METER) w/Device KIT 1 each by Does not apply route 2 (two) times daily. Use to monitor glucose levels BID; E11.29 (Patient taking differently: 1 each by Does not apply route 2 (two) times daily. E11.29) 1 kit 0  . carvedilol (COREG) 6.25 MG tablet Take 1 tablet by mouth twice daily. 180 tablet 0  . Cholecalciferol 25 MCG (1000 UT) tablet Take 1 tablet by mouth daily. 90 tablet 0  . citalopram (CELEXA) 20 MG tablet Take 1 tablet (20 mg total) by mouth daily. (Patient taking differently: Take 30 mg by mouth daily. ) 90 tablet 1  . fenofibrate (TRICOR) 145 MG tablet Take 1 tablet by mouth daily. 90 tablet 0  . fluticasone (FLONASE) 50 MCG/ACT nasal spray Place 2  sprays into both nostrils daily. 48 g 0  . folic acid (FOLVITE) 1 MG tablet Take 1 tablet (1 mg total) by mouth daily. 90 tablet 0  . insulin detemir (LEVEMIR) 100 UNIT/ML FlexPen Inject 55 Units into the skin every morning. 30 mL 11  . Insulin Pen Needle 31G X 5 MM MISC Inject 1 each into the skin 2 (two) times daily. Use to inject insulin twice daily; E11.29 (Patient taking differently: Inject 1 each into the skin 2 (two) times daily. E11.29) 100 each 1  . mirtazapine (REMERON) 15 MG tablet Take 7.5 mg by mouth at bedtime.    Marland Kitchen omeprazole (PRILOSEC) 40 MG capsule TAKE 1 CAPSULE(40 MG) BY  MOUTH DAILY 90 capsule 0  . simvastatin (ZOCOR) 20 MG tablet Take 1 tablet by mouth every night. 90 tablet 0  . traZODone (DESYREL) 50 MG tablet Take 50 mg by mouth at bedtime.     . TRUE METRIX BLOOD GLUCOSE TEST test strip TEST TWICE DAILY 200 strip 2  . VICTOZA 18 MG/3ML SOPN Inject 1.8 mg subcutaneously daily. 27 mL 0   No current facility-administered medications for this visit.    SURGICAL HISTORY:  Past Surgical History:  Procedure Laterality Date  . ANKLE RECONSTRUCTION     right  . CARPAL TUNNEL RELEASE    . CORONARY ANGIOPLASTY      REVIEW OF SYSTEMS:  A comprehensive review of systems was negative except for: Constitutional: positive for fatigue   PHYSICAL EXAMINATION: General appearance: alert, cooperative, fatigued and no distress Head: Normocephalic, without obvious abnormality, atraumatic Neck: no adenopathy, no JVD, supple, symmetrical, trachea midline and thyroid not enlarged, symmetric, no tenderness/mass/nodules Lymph nodes: Cervical, supraclavicular, and axillary nodes normal. Resp: clear to auscultation bilaterally Back: symmetric, no curvature. ROM normal. No CVA tenderness. Cardio: regular rate and rhythm, S1, S2 normal, no murmur, click, rub or gallop GI: soft, non-tender; bowel sounds normal; no masses,  no organomegaly Extremities: extremities normal, atraumatic, no cyanosis or edema  ECOG PERFORMANCE STATUS: 1 - Symptomatic but completely ambulatory  Blood pressure 135/65, pulse 71, temperature 97.9 F (36.6 C), resp. rate 12, height 5' 4"  (1.626 m), weight 138 lb 9.6 oz (62.9 kg), SpO2 100 %.  LABORATORY DATA: Lab Results  Component Value Date   WBC 7.7 10/12/2019   HGB 8.6 (L) 10/12/2019   HCT 27.6 (L) 10/12/2019   MCV 94.5 10/12/2019   PLT 326 10/12/2019      Chemistry      Component Value Date/Time   NA 139 10/12/2019 0511   NA 146 (H) 05/07/2017 1147   NA 137 05/29/2011 1946   K 4.2 10/12/2019 0511   K 3.9 05/29/2011 1946   CL 106  10/12/2019 0511   CL 100 05/29/2011 1946   CO2 24 10/12/2019 0511   CO2 28 05/29/2011 1946   BUN 39 (H) 10/12/2019 0511   BUN 28 (H) 05/07/2017 1147   BUN 24 (H) 05/29/2011 1946   CREATININE 2.45 (H) 10/12/2019 0511   CREATININE 1.52 (H) 04/08/2018 1154   CREATININE 1.56 (H) 01/01/2017 0941      Component Value Date/Time   CALCIUM 9.9 10/12/2019 0511   CALCIUM 9.8 05/29/2011 1946   ALKPHOS 37 (L) 10/12/2019 0511   ALKPHOS 86 05/29/2011 1946   AST 19 10/12/2019 0511   AST 14 (L) 04/08/2018 1154   ALT 11 10/12/2019 0511   ALT 13 04/08/2018 1154   ALT 15 05/29/2011 1946   BILITOT 0.5 10/12/2019 0511  BILITOT 0.4 04/08/2018 1154       RADIOGRAPHIC STUDIES: MM 3D SCREEN BREAST BILATERAL  Result Date: 11/16/2019 CLINICAL DATA:  Screening. EXAM: DIGITAL SCREENING BILATERAL MAMMOGRAM WITH TOMO AND CAD COMPARISON:  Previous exam(s). ACR Breast Density Category a: The breast tissue is almost entirely fatty. FINDINGS: There are no findings suspicious for malignancy. Images were processed with CAD. IMPRESSION: No mammographic evidence of malignancy. A result letter of this screening mammogram will be mailed directly to the patient. RECOMMENDATION: Screening mammogram in one year. (Code:SM-B-01Y) BI-RADS CATEGORY  1: Negative. Electronically Signed   By: Margarette Canada M.D.   On: 11/16/2019 10:44    ASSESSMENT AND PLAN: This is a very pleasant 74 years old African-American female with anemia of chronic disease secondary to chronic renal insufficiency.  The patient is currently on treatment with Retacrit 40,000 units every 2 weeks and has been tolerating this treatment well.  She is also on oral iron tablets 1-2 tablets every day. The patient missed her treatment for several months and her hemoglobin is low today. I recommended for her to resume her Retacrit injection starting today. I will see her back for follow-up visit in 3 months for evaluation with repeat CBC, iron study and  ferritin. The patient was advised to call immediately if she has any concerning symptoms in the interval. The patient voices understanding of current disease status and treatment options and is in agreement with the current care plan. All questions were answered. The patient knows to call the clinic with any problems, questions or concerns. We can certainly see the patient much sooner if necessary.   Disclaimer: This note was dictated with voice recognition software. Similar sounding words can inadvertently be transcribed and may not be corrected upon review.

## 2019-11-26 ENCOUNTER — Encounter: Payer: Self-pay | Admitting: Endocrinology

## 2019-11-26 ENCOUNTER — Ambulatory Visit (INDEPENDENT_AMBULATORY_CARE_PROVIDER_SITE_OTHER): Payer: Medicare HMO | Admitting: Endocrinology

## 2019-11-26 ENCOUNTER — Other Ambulatory Visit: Payer: Self-pay

## 2019-11-26 VITALS — BP 126/64 | HR 76 | Ht 64.0 in | Wt 147.0 lb

## 2019-11-26 DIAGNOSIS — N1831 Chronic kidney disease, stage 3a: Secondary | ICD-10-CM | POA: Diagnosis not present

## 2019-11-26 DIAGNOSIS — Z794 Long term (current) use of insulin: Secondary | ICD-10-CM

## 2019-11-26 DIAGNOSIS — N183 Chronic kidney disease, stage 3 unspecified: Secondary | ICD-10-CM | POA: Diagnosis not present

## 2019-11-26 DIAGNOSIS — E1122 Type 2 diabetes mellitus with diabetic chronic kidney disease: Secondary | ICD-10-CM | POA: Diagnosis not present

## 2019-11-26 LAB — POCT GLYCOSYLATED HEMOGLOBIN (HGB A1C): Hemoglobin A1C: 7.9 % — AB (ref 4.0–5.6)

## 2019-11-26 MED ORDER — INSULIN DETEMIR 100 UNIT/ML FLEXPEN: 50.0000 [IU] | PEN_INJECTOR | SUBCUTANEOUS | 11 refills | Status: DC

## 2019-11-26 NOTE — Progress Notes (Signed)
Subjective:    Patient ID: Courtney Goodman, female    DOB: [birth year], 74 y.o.   MRN: [id number]  HPI Pt returns for f/u of diabetes mellitus: DM type: Insulin-requiring type 2 Dx'ed: 3149 Complications: stage 3 CRI and CAD.  Therapy: insulin since 2009, and victoza. GDM: never DKA: never Severe hypoglycemia: 2012, 2019, and 2021.   Pancreatitis: never.  SDOH: ins declined tresiba.   Other: she is on qd insulin, due to h/o noncompliance; she declines to change to NPH.   Interval history: no cbg record, but states cbg varies from 46-400.  It is in general higher as the day goes on. Pt says she takes levemir 52 units qam, and she does not miss it.   Past Medical History:  Diagnosis Date  . Anemia of chronic disease   . Asthma   . Atherosclerotic heart disease of native coronary artery without angina pectoris   . CKD (chronic kidney disease)   . COPD (chronic obstructive pulmonary disease) ([place])   . DM (diabetes mellitus), type 2 with renal complications ([place])   . Dysrhythmia   . Elevated ferritin 01/03/2017  . GAD (generalized anxiety disorder)   . Hypertension   . Mixed hyperlipidemia due to type 2 diabetes mellitus ([place])   . Mixed incontinence    per medical records from [place]  . Osteopenia   . Personal history of noncompliance with medical treatment, presenting hazards to health   . Shortness of breath dyspnea   . Suicidal ideation   . TB (tuberculosis), treated    age 34  . Vitamin D deficiency     Past Surgical History:  Procedure Laterality Date  . ANKLE RECONSTRUCTION     right  . CARPAL TUNNEL RELEASE    . CORONARY ANGIOPLASTY      Social History   Socioeconomic History  . Marital status: Divorced    Spouse name: Not on file  . Number of children: 2  . Years of education: 52  . Highest education level: High school graduate  Occupational History  . Occupation: Retired  Tobacco Use  . Smoking status: Former Research scientist (life sciences)  . Smokeless tobacco: Never Used   Vaping Use  . Vaping Use: Never used  Substance and Sexual Activity  . Alcohol use: No  . Drug use: No  . Sexual activity: Never  Other Topics Concern  . Not on file  Social History Narrative   Lives alone    Left handed   Social Determinants of Health   Financial Resource Strain: Medium Risk  . Difficulty of Paying Living Expenses: Somewhat hard  Food Insecurity: No Food Insecurity  . Worried About Charity fundraiser in the Last Year: Never true  . Ran Out of Food in the Last Year: Never true  Transportation Needs: No Transportation Needs  . Lack of Transportation (Medical): No  . Lack of Transportation (Non-Medical): No  Physical Activity: Inactive  . Days of Exercise per Week: 0 days  . Minutes of Exercise per Session: 0 min  Stress: Stress Concern Present  . Feeling of Stress : To some extent  Social Connections: Moderately Isolated  . Frequency of Communication with Friends and Family: More than three times a week  . Frequency of Social Gatherings with Friends and Family: More than three times a week  . Attends Religious Services: More than 4 times per year  . Active Member of Clubs or Organizations: No  . Attends Archivist Meetings: Never  .  Marital Status: Divorced  Human resources officer Violence: Not At Risk  . Fear of Current or Ex-Partner: No  . Emotionally Abused: No  . Physically Abused: No  . Sexually Abused: No    Current Outpatient Medications on File Prior to Visit  Medication Sig Dispense Refill  . albuterol (PROVENTIL HFA;VENTOLIN HFA) 108 (90 Base) MCG/ACT inhaler Inhale 2 puffs into the lungs every 6 (six) hours as needed for wheezing or shortness of breath. 1 Inhaler 0  . Alcohol Swabs (ALCOHOL PREP) PADS Use for testing (Patient taking differently: 1 each by Other route 2 (two) times daily. E11.9) 100 each 1  . amLODipine-benazepril (LOTREL) 5-20 MG capsule Take 1 capsule by mouth daily. 90 capsule 0  . amoxicillin (AMOXIL) 500 MG capsule  Take 1 capsule (500 mg total) by mouth 2 (two) times daily. 10 capsule 0  . aspirin EC 81 MG tablet Take 81 mg by mouth daily.     . Biotin 10000 MCG TABS Take 1 tablet by mouth daily.     . Blood Glucose Monitoring Suppl (TRUE METRIX METER) w/Device KIT 1 each by Does not apply route 2 (two) times daily. Use to monitor glucose levels BID; E11.29 (Patient taking differently: 1 each by Does not apply route 2 (two) times daily. E11.29) 1 kit 0  . carvedilol (COREG) 6.25 MG tablet Take 1 tablet by mouth twice daily. 180 tablet 0  . Cholecalciferol 25 MCG (1000 UT) tablet Take 1 tablet by mouth daily. 90 tablet 0  . citalopram (CELEXA) 20 MG tablet Take 1 tablet (20 mg total) by mouth daily. (Patient taking differently: Take 30 mg by mouth daily. ) 90 tablet 1  . fenofibrate (TRICOR) 145 MG tablet Take 1 tablet by mouth daily. 90 tablet 0  . fluticasone (FLONASE) 50 MCG/ACT nasal spray Place 2 sprays into both nostrils daily. 48 g 0  . folic acid (FOLVITE) 1 MG tablet Take 1 tablet (1 mg total) by mouth daily. 90 tablet 0  . Insulin Pen Needle 31G X 5 MM MISC Inject 1 each into the skin 2 (two) times daily. Use to inject insulin twice daily; E11.29 (Patient taking differently: Inject 1 each into the skin 2 (two) times daily. E11.29) 100 each 1  . mirtazapine (REMERON) 15 MG tablet Take 7.5 mg by mouth at bedtime.    Marland Kitchen omeprazole (PRILOSEC) 40 MG capsule TAKE 1 CAPSULE(40 MG) BY MOUTH DAILY 90 capsule 0  . simvastatin (ZOCOR) 20 MG tablet Take 1 tablet by mouth every night. 90 tablet 0  . traZODone (DESYREL) 50 MG tablet Take 50 mg by mouth at bedtime.     . TRUE METRIX BLOOD GLUCOSE TEST test strip TEST TWICE DAILY 200 strip 2  . VICTOZA 18 MG/3ML SOPN Inject 1.8 mg subcutaneously daily. 27 mL 0   No current facility-administered medications on file prior to visit.    No Known Allergies  Family History  Problem Relation Age of Onset  . Diabetes Mother     BP 126/64   Pulse 76   Ht 5' 4"   (1.626 m)   Wt 147 lb (66.7 kg)   SpO2 99%   BMI 25.23 kg/m    Review of Systems She denies hypoglycemia    Objective:   Physical Exam VITAL SIGNS:  See vs page GENERAL: no distress Pulses: dorsalis pedis intact bilat.   MSK: no deformity of the feet CV: no leg edema Skin:  no ulcer on the feet.  normal color and temp  on the feet. Neuro: sensation is intact to touch on the feet  Lab Results  Component Value Date   HGBA1C 7.9 (A) 11/26/2019   Lab Results  Component Value Date   CREATININE 2.45 (H) 10/12/2019   BUN 39 (H) 10/12/2019   NA 139 10/12/2019   K 4.2 10/12/2019   CL 106 10/12/2019   CO2 24 10/12/2019        Assessment & Plan:  Insulin-requiring type 2 DM, with CRI Hypoglycemia, due to insulin: She declines to change to NPH.    Patient Instructions  Please reduce the Levemir to 50 units each morning.   Please continue the same Victoza.    Please see an eye specialist.  you will receive a phone call, about a day and time for an appointment.   check your blood sugar twice a day.  vary the time of day when you check, between before the 3 meals, and at bedtime.  also check if you have symptoms of your blood sugar being too high or too low.  please keep a record of the readings and bring it to your next appointment here (or you can bring the meter itself).  You can write it on any piece of paper.  please call us sooner if your blood sugar goes below 70, or if you have a lot of readings over 200. Please come back for a follow-up appointment in 3 months.

## 2019-11-26 NOTE — Patient Instructions (Addendum)
Please reduce the Levemir to 50 units each morning.   Please continue the same Victoza.    Please see an eye specialist.  you will receive a phone call, about a day and time for an appointment.   check your blood sugar twice a day.  vary the time of day when you check, between before the 3 meals, and at bedtime.  also check if you have symptoms of your blood sugar being too high or too low.  please keep a record of the readings and bring it to your next appointment here (or you can bring the meter itself).  You can write it on any piece of paper.  please call us sooner if your blood sugar goes below 70, or if you have a lot of readings over 200. Please come back for a follow-up appointment in 3 months.

## 2019-11-29 ENCOUNTER — Ambulatory Visit (INDEPENDENT_AMBULATORY_CARE_PROVIDER_SITE_OTHER): Payer: Medicare HMO | Admitting: Podiatry

## 2019-11-29 ENCOUNTER — Encounter: Payer: Self-pay | Admitting: Podiatry

## 2019-11-29 ENCOUNTER — Other Ambulatory Visit: Payer: Self-pay

## 2019-11-29 DIAGNOSIS — B351 Tinea unguium: Secondary | ICD-10-CM

## 2019-11-29 DIAGNOSIS — L84 Corns and callosities: Secondary | ICD-10-CM | POA: Diagnosis not present

## 2019-11-29 DIAGNOSIS — N183 Chronic kidney disease, stage 3 unspecified: Secondary | ICD-10-CM | POA: Diagnosis not present

## 2019-11-29 DIAGNOSIS — Z794 Long term (current) use of insulin: Secondary | ICD-10-CM | POA: Diagnosis not present

## 2019-11-29 DIAGNOSIS — M79609 Pain in unspecified limb: Secondary | ICD-10-CM | POA: Diagnosis not present

## 2019-11-29 DIAGNOSIS — E1122 Type 2 diabetes mellitus with diabetic chronic kidney disease: Secondary | ICD-10-CM | POA: Diagnosis not present

## 2019-11-30 ENCOUNTER — Telehealth: Payer: Self-pay | Admitting: Physician Assistant

## 2019-11-30 NOTE — Telephone Encounter (Signed)
Scheduled per los. Called and spoke with patient. Confirmed appts  

## 2019-12-02 NOTE — Progress Notes (Signed)
Subjective: Courtney Goodman presents today for for annual diabetic foot examination and painful callus(es) b/l feet and painful thick toenails that are difficult to trim. Pain interferes with ambulation. Aggravating factors include wearing enclosed shoe gear. Pain is relieved with periodic professional debridement. She voices no new pedal concerns on today's visit.  [name], NP-C is patient's PCP. Last visit was 10/28/2019. She is also followed by Dr. Renato Shin for her diabetes management.  She states since her last visit she has fallen out of bed and hit her head. She was seen in ED on 10/12/2019.  Past Medical History:  Diagnosis Date  . Anemia of chronic disease   . Asthma   . Atherosclerotic heart disease of native coronary artery without angina pectoris   . CKD (chronic kidney disease)   . COPD (chronic obstructive pulmonary disease) ([place])   . DM (diabetes mellitus), type 2 with renal complications ([place])   . Dysrhythmia   . Elevated ferritin 01/03/2017  . GAD (generalized anxiety disorder)   . Hypertension   . Mixed hyperlipidemia due to type 2 diabetes mellitus ([place])   . Mixed incontinence    per medical records from [place]  . Osteopenia   . Personal history of noncompliance with medical treatment, presenting hazards to health   . Shortness of breath dyspnea   . Suicidal ideation   . TB (tuberculosis), treated    age 34  . Vitamin D deficiency     Patient Active Problem List   Diagnosis Date Noted  . Poor appetite 09/29/2019  . Assistance needed with transportation 09/29/2019  . Medication noncompliance due to cognitive impairment 10/12/2018  . Severe episode of recurrent major depressive disorder, without psychotic features ([place]) 10/12/2018  . Acute renal failure superimposed on stage 3 chronic kidney disease ([place]) 09/22/2018  . Leukocytosis 09/22/2018  . Non-intractable vomiting   . GERD (gastroesophageal reflux disease) 08/23/2017  . CAD (coronary artery  disease) 08/23/2017  . Hypoglycemia 08/23/2017  . Acute metabolic encephalopathy 08/67/6195  . Hypokalemia 08/23/2017  . Type II diabetes mellitus with renal manifestations ([place]) 08/23/2017  . Elevated ferritin 01/03/2017  . Personal history of noncompliance with medical treatment, presenting hazards to health   . Urinary incontinence, mixed 05/27/2016  . Major depression, recurrent ([place]) 05/27/2016  . Atherosclerotic heart disease of native coronary artery without angina pectoris 05/27/2016  . GAD (generalized anxiety disorder)   . Anemia of chronic disease   . Mixed hyperlipidemia due to type 2 diabetes mellitus ([place])   . Diabetes ([place]) 11/30/2015  . Vitamin D deficiency 11/23/2015  . HTN (hypertension) 11/23/2015  . Anemia 11/23/2015  . CKD (chronic kidney disease), stage III ([place]) 11/23/2015  . Dysrhythmia   . COPD (chronic obstructive pulmonary disease) ([place])     Past Surgical History:  Procedure Laterality Date  . ANKLE RECONSTRUCTION     right  . CARPAL TUNNEL RELEASE    . CORONARY ANGIOPLASTY      Current Outpatient Medications on File Prior to Visit  Medication Sig Dispense Refill  . albuterol (PROVENTIL HFA;VENTOLIN HFA) 108 (90 Base) MCG/ACT inhaler Inhale 2 puffs into the lungs every 6 (six) hours as needed for wheezing or shortness of breath. 1 Inhaler 0  . Alcohol Swabs (ALCOHOL PREP) PADS Use for testing (Patient taking differently: 1 each by Other route 2 (two) times daily. E11.9) 100 each 1  . amLODipine-benazepril (LOTREL) 5-20 MG capsule Take 1 capsule by mouth daily. 90 capsule 0  . amoxicillin (AMOXIL)  500 MG capsule Take 1 capsule (500 mg total) by mouth 2 (two) times daily. 10 capsule 0  . aspirin EC 81 MG tablet Take 81 mg by mouth daily.     . Biotin 10000 MCG TABS Take 1 tablet by mouth daily.     . Blood Glucose Monitoring Suppl (TRUE METRIX METER) w/Device KIT 1 each by Does not apply route 2 (two) times daily. Use to monitor glucose levels BID;  E11.29 (Patient taking differently: 1 each by Does not apply route 2 (two) times daily. E11.29) 1 kit 0  . carvedilol (COREG) 6.25 MG tablet Take 1 tablet by mouth twice daily. 180 tablet 0  . Cholecalciferol 25 MCG (1000 UT) tablet Take 1 tablet by mouth daily. 90 tablet 0  . citalopram (CELEXA) 20 MG tablet Take 1 tablet (20 mg total) by mouth daily. (Patient taking differently: Take 30 mg by mouth daily. ) 90 tablet 1  . fenofibrate (TRICOR) 145 MG tablet Take 1 tablet by mouth daily. 90 tablet 0  . fluticasone (FLONASE) 50 MCG/ACT nasal spray Place 2 sprays into both nostrils daily. 48 g 0  . folic acid (FOLVITE) 1 MG tablet Take 1 tablet (1 mg total) by mouth daily. 90 tablet 0  . insulin detemir (LEVEMIR) 100 UNIT/ML FlexPen Inject 50 Units into the skin every morning. 30 mL 11  . Insulin Pen Needle 31G X 5 MM MISC Inject 1 each into the skin 2 (two) times daily. Use to inject insulin twice daily; E11.29 (Patient taking differently: Inject 1 each into the skin 2 (two) times daily. E11.29) 100 each 1  . mirtazapine (REMERON) 15 MG tablet Take 7.5 mg by mouth at bedtime.    Marland Kitchen omeprazole (PRILOSEC) 40 MG capsule TAKE 1 CAPSULE(40 MG) BY MOUTH DAILY 90 capsule 0  . simvastatin (ZOCOR) 20 MG tablet Take 1 tablet by mouth every night. 90 tablet 0  . traZODone (DESYREL) 50 MG tablet Take 50 mg by mouth at bedtime.     . TRUE METRIX BLOOD GLUCOSE TEST test strip TEST TWICE DAILY 200 strip 2  . VICTOZA 18 MG/3ML SOPN Inject 1.8 mg subcutaneously daily. 27 mL 0   No current facility-administered medications on file prior to visit.     No Known Allergies  Social History   Occupational History  . Occupation: Retired  Tobacco Use  . Smoking status: Former Research scientist (life sciences)  . Smokeless tobacco: Never Used  Vaping Use  . Vaping Use: Never used  Substance and Sexual Activity  . Alcohol use: No  . Drug use: No  . Sexual activity: Never    Family History  Problem Relation Age of Onset  . Diabetes  Mother     Immunization History  Administered Date(s) Administered  . Influenza, High Dose Seasonal PF 11/23/2015, 01/03/2017  . Pneumococcal Conjugate-13 04/18/2016  . Pneumococcal Polysaccharide-23 05/07/2017  . Tdap 10/12/2019     Objective: There were no vitals filed for this visit.  Courtney Goodman is a pleasant 74 y.o. African American female in NAD. AAO X 3.  Vascular Examination: Capillary fill time to digits <3 seconds b/l lower extremities. Faintly palpable DP pulse(s) b/l lower extremities. Faintly palpable PT pulse(s) b/l lower extremities. Pedal hair absent. Lower extremity skin temperature gradient within normal limits. No pain with calf compression b/l. No edema noted b/l lower extremities. No ischemia or gangrene noted b/l lower extremities.  Dermatological Examination: Pedal skin with normal turgor, texture and tone bilaterally. No open wounds bilaterally. No  interdigital macerations bilaterally. Toenails 1-5 b/l elongated, discolored, dystrophic, thickened, crumbly with subungual debris and tenderness to dorsal palpation. Hyperkeratotic lesion(s) submet head 5 left foot and submet head 5 right foot.  No erythema, no edema, no drainage, no flocculence.  Musculoskeletal Examination: Normal muscle strength 5/5 to all lower extremity muscle groups bilaterally. No pain crepitus or joint limitation noted with ROM b/l. Plantarflexed metatarsal(s) 5th metatarsals b/l lower extremities.  Neurological Examination: Protective sensation intact 5/5 intact bilaterally with 10g monofilament b/l. Vibratory sensation intact left lower extremity. Vibratory sensation diminished right lower extremity. Proprioception intact bilaterally. Clonus negative b/l.  Hemoglobin A1C Latest Ref Rng & Units 11/26/2019 09/15/2019 06/21/2019 03/22/2019 01/12/2019  HGBA1C 4.0 - 5.6 % 7.9(A) 8.2(A) 7.5(A) 7.6(A) 7.1(A)  Some recent data might be hidden   Assessment: 1. Pain due to onychomycosis of nail    2. Callus   3. Type 2 diabetes mellitus with stage 3 chronic kidney disease, with long-term current use of insulin, unspecified whether stage 3a or 3b CKD ([place])     Plan: -Examined patient. -No new findings. No new orders. -Continue diabetic foot care principles. -Toenails 1-5 b/l were debrided in length and girth with sterile nail nippers and dremel without iatrogenic bleeding.  -Callus(es) submet head 5 left foot and submet head 5 right foot pared utilizing sterile scalpel blade without complication or incident. Total number debrided =2. -Patient to report any pedal injuries to medical professional immediately.  Return in about 3 months (around 02/29/2020).  Marzetta Board, DPM

## 2019-12-07 ENCOUNTER — Inpatient Hospital Stay: Payer: Medicare HMO

## 2019-12-07 ENCOUNTER — Other Ambulatory Visit: Payer: Self-pay

## 2019-12-07 ENCOUNTER — Inpatient Hospital Stay: Payer: Medicare HMO | Attending: Oncology

## 2019-12-07 VITALS — BP 113/69 | HR 67 | Resp 18

## 2019-12-07 DIAGNOSIS — I129 Hypertensive chronic kidney disease with stage 1 through stage 4 chronic kidney disease, or unspecified chronic kidney disease: Secondary | ICD-10-CM | POA: Insufficient documentation

## 2019-12-07 DIAGNOSIS — Z79899 Other long term (current) drug therapy: Secondary | ICD-10-CM | POA: Insufficient documentation

## 2019-12-07 DIAGNOSIS — N183 Chronic kidney disease, stage 3 unspecified: Secondary | ICD-10-CM | POA: Diagnosis not present

## 2019-12-07 DIAGNOSIS — D631 Anemia in chronic kidney disease: Secondary | ICD-10-CM | POA: Insufficient documentation

## 2019-12-07 DIAGNOSIS — D638 Anemia in other chronic diseases classified elsewhere: Secondary | ICD-10-CM

## 2019-12-07 LAB — IRON AND TIBC
Iron: 67 ug/dL (ref 41–142)
Saturation Ratios: 20 % — ABNORMAL LOW (ref 21–57)
TIBC: 329 ug/dL (ref 236–444)
UIBC: 262 ug/dL (ref 120–384)

## 2019-12-07 LAB — CBC WITH DIFFERENTIAL (CANCER CENTER ONLY)
Abs Immature Granulocytes: 0.03 10*3/uL (ref 0.00–0.07)
Basophils Absolute: 0.1 10*3/uL (ref 0.0–0.1)
Basophils Relative: 1 %
Eosinophils Absolute: 0.2 10*3/uL (ref 0.0–0.5)
Eosinophils Relative: 3 %
HCT: 30 % — ABNORMAL LOW (ref 36.0–46.0)
Hemoglobin: 9.6 g/dL — ABNORMAL LOW (ref 12.0–15.0)
Immature Granulocytes: 1 %
Lymphocytes Relative: 19 %
Lymphs Abs: 1.2 10*3/uL (ref 0.7–4.0)
MCH: 29.3 pg (ref 26.0–34.0)
MCHC: 32 g/dL (ref 30.0–36.0)
MCV: 91.5 fL (ref 80.0–100.0)
Monocytes Absolute: 0.4 10*3/uL (ref 0.1–1.0)
Monocytes Relative: 7 %
Neutro Abs: 4.3 10*3/uL (ref 1.7–7.7)
Neutrophils Relative %: 69 %
Platelet Count: 298 10*3/uL (ref 150–400)
RBC: 3.28 MIL/uL — ABNORMAL LOW (ref 3.87–5.11)
RDW: 16 % — ABNORMAL HIGH (ref 11.5–15.5)
WBC Count: 6.1 10*3/uL (ref 4.0–10.5)
nRBC: 0 % (ref 0.0–0.2)

## 2019-12-07 LAB — FERRITIN: Ferritin: 419 ng/mL — ABNORMAL HIGH (ref 11–307)

## 2019-12-07 MED ORDER — EPOETIN ALFA-EPBX 40000 UNIT/ML IJ SOLN
INTRAMUSCULAR | Status: AC
  Filled 2019-12-07: qty 1

## 2019-12-07 MED ORDER — EPOETIN ALFA-EPBX 40000 UNIT/ML IJ SOLN
40000.0000 [IU] | Freq: Once | INTRAMUSCULAR | Status: AC
  Administered 2019-12-07: 40000 [IU] via SUBCUTANEOUS

## 2019-12-13 ENCOUNTER — Other Ambulatory Visit: Payer: Self-pay | Admitting: Family Medicine

## 2019-12-17 ENCOUNTER — Telehealth: Payer: Self-pay | Admitting: Medical Oncology

## 2019-12-17 NOTE — Telephone Encounter (Signed)
appt confirmed

## 2019-12-21 ENCOUNTER — Other Ambulatory Visit: Payer: Self-pay

## 2019-12-21 ENCOUNTER — Inpatient Hospital Stay: Payer: Medicare HMO

## 2019-12-21 DIAGNOSIS — N183 Chronic kidney disease, stage 3 unspecified: Secondary | ICD-10-CM | POA: Diagnosis not present

## 2019-12-21 DIAGNOSIS — D638 Anemia in other chronic diseases classified elsewhere: Secondary | ICD-10-CM

## 2019-12-21 DIAGNOSIS — Z79899 Other long term (current) drug therapy: Secondary | ICD-10-CM | POA: Diagnosis not present

## 2019-12-21 DIAGNOSIS — D631 Anemia in chronic kidney disease: Secondary | ICD-10-CM | POA: Diagnosis not present

## 2019-12-21 DIAGNOSIS — I129 Hypertensive chronic kidney disease with stage 1 through stage 4 chronic kidney disease, or unspecified chronic kidney disease: Secondary | ICD-10-CM | POA: Diagnosis not present

## 2019-12-21 LAB — CBC WITH DIFFERENTIAL (CANCER CENTER ONLY)
Abs Immature Granulocytes: 0.04 10*3/uL (ref 0.00–0.07)
Basophils Absolute: 0.1 10*3/uL (ref 0.0–0.1)
Basophils Relative: 1 %
Eosinophils Absolute: 0.1 10*3/uL (ref 0.0–0.5)
Eosinophils Relative: 2 %
HCT: 35.4 % — ABNORMAL LOW (ref 36.0–46.0)
Hemoglobin: 11.3 g/dL — ABNORMAL LOW (ref 12.0–15.0)
Immature Granulocytes: 1 %
Lymphocytes Relative: 20 %
Lymphs Abs: 1.4 10*3/uL (ref 0.7–4.0)
MCH: 30.5 pg (ref 26.0–34.0)
MCHC: 31.9 g/dL (ref 30.0–36.0)
MCV: 95.4 fL (ref 80.0–100.0)
Monocytes Absolute: 0.5 10*3/uL (ref 0.1–1.0)
Monocytes Relative: 7 %
Neutro Abs: 5.2 10*3/uL (ref 1.7–7.7)
Neutrophils Relative %: 69 %
Platelet Count: 361 10*3/uL (ref 150–400)
RBC: 3.71 MIL/uL — ABNORMAL LOW (ref 3.87–5.11)
RDW: 16.7 % — ABNORMAL HIGH (ref 11.5–15.5)
WBC Count: 7.4 10*3/uL (ref 4.0–10.5)
nRBC: 0 % (ref 0.0–0.2)

## 2019-12-21 LAB — IRON AND TIBC
Iron: 60 ug/dL (ref 41–142)
Saturation Ratios: 18 % — ABNORMAL LOW (ref 21–57)
TIBC: 335 ug/dL (ref 236–444)
UIBC: 275 ug/dL (ref 120–384)

## 2019-12-21 LAB — FERRITIN: Ferritin: 314 ng/mL — ABNORMAL HIGH (ref 11–307)

## 2019-12-21 NOTE — Progress Notes (Signed)
No injection today due to parameters. GAve patient a copy of labs.

## 2019-12-30 ENCOUNTER — Other Ambulatory Visit: Payer: Self-pay

## 2019-12-30 NOTE — Patient Outreach (Signed)
[facility]) Care Management  [place]  12/30/2019   Courtney Goodman May 14, 1945 [id number]  Subjective: Telephone call to daughter Courtney Goodman. She reports that patient doing good with sugars ranging in 120 range.  A1c 7.9.  Discussed diabetes control and importance of diet.  She verbalized understanding.   Objective:   Encounter Medications:  Outpatient Encounter Medications as of 12/30/2019  Medication Sig  . albuterol (PROVENTIL HFA;VENTOLIN HFA) 108 (90 Base) MCG/ACT inhaler Inhale 2 puffs into the lungs every 6 (six) hours as needed for wheezing or shortness of breath.  . Alcohol Swabs (ALCOHOL PREP) PADS Use for testing (Patient taking differently: 1 each by Other route 2 (two) times daily. E11.9)  . amLODipine-benazepril (LOTREL) 5-20 MG capsule Take 1 capsule by mouth daily.  Marland Kitchen amoxicillin (AMOXIL) 500 MG capsule Take 1 capsule (500 mg total) by mouth 2 (two) times daily.  Marland Kitchen aspirin EC 81 MG tablet Take 81 mg by mouth daily.   . Biotin 10000 MCG TABS Take 1 tablet by mouth daily.   . Blood Glucose Monitoring Suppl (TRUE METRIX METER) w/Device KIT 1 each by Does not apply route 2 (two) times daily. Use to monitor glucose levels BID; E11.29 (Patient taking differently: 1 each by Does not apply route 2 (two) times daily. E11.29)  . carvedilol (COREG) 6.25 MG tablet Take 1 tablet by mouth twice daily.  . Cholecalciferol 25 MCG (1000 UT) tablet Take 1 tablet by mouth daily.  . citalopram (CELEXA) 20 MG tablet Take 1 tablet (20 mg total) by mouth daily. (Patient taking differently: Take 30 mg by mouth daily. )  . fenofibrate (TRICOR) 145 MG tablet Take 1 tablet by mouth daily.  . fluticasone (FLONASE) 50 MCG/ACT nasal spray Place 2 sprays into both nostrils daily.  . folic acid (FOLVITE) 1 MG tablet Take 1 tablet (1 mg total) by mouth daily.  . insulin detemir (LEVEMIR) 100 UNIT/ML FlexPen Inject 50 Units into the skin every morning.  . Insulin Pen Needle 31G X 5 MM  MISC Inject 1 each into the skin 2 (two) times daily. Use to inject insulin twice daily; E11.29 (Patient taking differently: Inject 1 each into the skin 2 (two) times daily. E11.29)  . mirtazapine (REMERON) 15 MG tablet Take 7.5 mg by mouth at bedtime.  Marland Kitchen omeprazole (PRILOSEC) 40 MG capsule TAKE 1 CAPSULE(40 MG) BY MOUTH DAILY  . simvastatin (ZOCOR) 20 MG tablet Take 1 tablet by mouth every night.  . traZODone (DESYREL) 50 MG tablet Take 50 mg by mouth at bedtime.   . TRUE METRIX BLOOD GLUCOSE TEST test strip TEST TWICE DAILY  . VICTOZA 18 MG/3ML SOPN Inject 1.8 mg subcutaneously daily.   No facility-administered encounter medications on file as of 12/30/2019.    Functional Status:  In your present state of health, do you have any difficulty performing the following activities: 10/07/2019 10/05/2019  Hearing? Y Y  Comment - Hard of hearing, wears hearing aides.  Vision? N N  Difficulty concentrating or making decisions? N N  Walking or climbing stairs? N N  Dressing or bathing? Y N  Comment tiredness -  Doing errands, shopping? Aggie Moats  Comment daughter handles -  Conservation officer, nature and eating ? N N  Using the Toilet? N N  In the past six months, have you accidently leaked urine? N N  Do you have problems with loss of bowel control? N N  Managing your Medications? Y N  Comment patient forgetful -  Managing your Finances? Y N  Comment family assists -  Housekeeping or managing your Housekeeping? Y N  Comment family assists -  Some recent data might be hidden    Fall/Depression Screening: Fall Risk  10/28/2019 10/07/2019 10/05/2019  Falls in the past year? 1 1 1   Number falls in past yr: 1 1 1   Injury with Fall? 1 1 1   Risk Factor Category  - - -  Risk for fall due to : - - Impaired balance/gait;History of fall(s);Impaired mobility  Follow up - - Education provided;Falls prevention discussed   PHQ 2/9 Scores 10/05/2019 10/05/2019 09/29/2019 07/08/2019 10/12/2018 10/08/2018 10/01/2018  PHQ - 2 Score 0 0  0 0 6 1 1   PHQ- 9 Score - - - - 24 - -    Assessment: Patient continues to manage chronic conditions with assistance of family.   Goals Addressed            This Visit's Progress   . Set My Target A1C   On track    Follow Up Date 03/27/20   - set target A1C    Why is this important?   Your target A1C is decided together by you and your doctor.  It is based on several things like your age and other health issues.    Notes: MD goal of 8 or less    . THN-Monitor and Manage My Blood Sugar   On track    Follow Up Date 03/27/20   - check blood sugar at prescribed times - enter blood sugar readings and medication or insulin into daily log - take the blood sugar meter to all doctor visits    Why is this important?   Checking your blood sugar at home helps to keep it from getting very high or very low.  Writing the results in a diary or log helps the doctor know how to care for you.  Your blood sugar log should have the time, date and the results.  Also, write down the amount of insulin or other medicine that you take.  Other information, like what you ate, exercise done and how you were feeling, will also be helpful.     Notes: Patient keeps log of blood sugars.  A1c 7.9       Plan: RN CM will contact in the month of February and daughter Courtney Goodman agreeable.   Jone Baseman, RN, MSN [facility] Care Management Coordinator Direct Line [phone] Cell [phone] Toll Free: [phone]  Fax: [phone]

## 2019-12-30 NOTE — Patient Instructions (Signed)
Goals Addressed            This Visit's Progress   . Set My Target A1C   On track    Follow Up Date 03/27/20   - set target A1C    Why is this important?   Your target A1C is decided together by you and your doctor.  It is based on several things like your age and other health issues.    Notes: MD goal of 8 or less    . THN-Monitor and Manage My Blood Sugar   On track    Follow Up Date 03/27/20   - check blood sugar at prescribed times - enter blood sugar readings and medication or insulin into daily log - take the blood sugar meter to all doctor visits    Why is this important?   Checking your blood sugar at home helps to keep it from getting very high or very low.  Writing the results in a diary or log helps the doctor know how to care for you.  Your blood sugar log should have the time, date and the results.  Also, write down the amount of insulin or other medicine that you take.  Other information, like what you ate, exercise done and how you were feeling, will also be helpful.     Notes: Patient keeps log of blood sugars.  A1c 7.9

## 2020-01-04 ENCOUNTER — Inpatient Hospital Stay: Payer: Medicare HMO

## 2020-01-04 ENCOUNTER — Inpatient Hospital Stay: Payer: Medicare HMO | Attending: Oncology

## 2020-01-04 ENCOUNTER — Other Ambulatory Visit: Payer: Self-pay

## 2020-01-04 ENCOUNTER — Ambulatory Visit: Payer: Medicare HMO

## 2020-01-04 ENCOUNTER — Other Ambulatory Visit: Payer: Self-pay | Admitting: Medical Oncology

## 2020-01-04 VITALS — BP 119/57 | HR 68 | Temp 98.5°F | Resp 17

## 2020-01-04 DIAGNOSIS — D631 Anemia in chronic kidney disease: Secondary | ICD-10-CM | POA: Insufficient documentation

## 2020-01-04 DIAGNOSIS — Z79899 Other long term (current) drug therapy: Secondary | ICD-10-CM | POA: Diagnosis not present

## 2020-01-04 DIAGNOSIS — N183 Chronic kidney disease, stage 3 unspecified: Secondary | ICD-10-CM | POA: Insufficient documentation

## 2020-01-04 DIAGNOSIS — I129 Hypertensive chronic kidney disease with stage 1 through stage 4 chronic kidney disease, or unspecified chronic kidney disease: Secondary | ICD-10-CM | POA: Diagnosis not present

## 2020-01-04 DIAGNOSIS — D649 Anemia, unspecified: Secondary | ICD-10-CM

## 2020-01-04 DIAGNOSIS — D638 Anemia in other chronic diseases classified elsewhere: Secondary | ICD-10-CM

## 2020-01-04 LAB — CBC WITH DIFFERENTIAL (CANCER CENTER ONLY)
Abs Immature Granulocytes: 0.01 10*3/uL (ref 0.00–0.07)
Basophils Absolute: 0.1 10*3/uL (ref 0.0–0.1)
Basophils Relative: 1 %
Eosinophils Absolute: 0.1 10*3/uL (ref 0.0–0.5)
Eosinophils Relative: 2 %
HCT: 32.8 % — ABNORMAL LOW (ref 36.0–46.0)
Hemoglobin: 10.4 g/dL — ABNORMAL LOW (ref 12.0–15.0)
Immature Granulocytes: 0 %
Lymphocytes Relative: 24 %
Lymphs Abs: 1.5 10*3/uL (ref 0.7–4.0)
MCH: 29.5 pg (ref 26.0–34.0)
MCHC: 31.7 g/dL (ref 30.0–36.0)
MCV: 92.9 fL (ref 80.0–100.0)
Monocytes Absolute: 0.5 10*3/uL (ref 0.1–1.0)
Monocytes Relative: 8 %
Neutro Abs: 4 10*3/uL (ref 1.7–7.7)
Neutrophils Relative %: 65 %
Platelet Count: 246 10*3/uL (ref 150–400)
RBC: 3.53 MIL/uL — ABNORMAL LOW (ref 3.87–5.11)
RDW: 16.1 % — ABNORMAL HIGH (ref 11.5–15.5)
WBC Count: 6.1 10*3/uL (ref 4.0–10.5)
nRBC: 0 % (ref 0.0–0.2)

## 2020-01-04 LAB — HM DIABETES EYE EXAM

## 2020-01-04 MED ORDER — EPOETIN ALFA-EPBX 40000 UNIT/ML IJ SOLN
INTRAMUSCULAR | Status: AC
  Filled 2020-01-04: qty 1

## 2020-01-04 MED ORDER — EPOETIN ALFA-EPBX 40000 UNIT/ML IJ SOLN
40000.0000 [IU] | Freq: Once | INTRAMUSCULAR | Status: AC
  Administered 2020-01-04: 40000 [IU] via SUBCUTANEOUS

## 2020-01-04 NOTE — Patient Instructions (Signed)

## 2020-01-04 NOTE — Addendum Note (Signed)
Addended by: Ardeen Garland on: 01/04/2020 10:03 AM   Modules accepted: Orders

## 2020-01-12 DIAGNOSIS — K219 Gastro-esophageal reflux disease without esophagitis: Secondary | ICD-10-CM | POA: Diagnosis not present

## 2020-01-12 DIAGNOSIS — R413 Other amnesia: Secondary | ICD-10-CM | POA: Diagnosis not present

## 2020-01-12 DIAGNOSIS — E1122 Type 2 diabetes mellitus with diabetic chronic kidney disease: Secondary | ICD-10-CM | POA: Diagnosis not present

## 2020-01-12 DIAGNOSIS — N183 Chronic kidney disease, stage 3 unspecified: Secondary | ICD-10-CM | POA: Diagnosis not present

## 2020-01-12 DIAGNOSIS — I129 Hypertensive chronic kidney disease with stage 1 through stage 4 chronic kidney disease, or unspecified chronic kidney disease: Secondary | ICD-10-CM | POA: Diagnosis not present

## 2020-01-12 DIAGNOSIS — D649 Anemia, unspecified: Secondary | ICD-10-CM | POA: Diagnosis not present

## 2020-01-13 DIAGNOSIS — N390 Urinary tract infection, site not specified: Secondary | ICD-10-CM | POA: Diagnosis not present

## 2020-01-18 ENCOUNTER — Inpatient Hospital Stay: Payer: Medicare HMO

## 2020-01-18 ENCOUNTER — Other Ambulatory Visit: Payer: Self-pay | Admitting: Internal Medicine

## 2020-01-18 ENCOUNTER — Other Ambulatory Visit: Payer: Self-pay

## 2020-01-18 ENCOUNTER — Other Ambulatory Visit: Payer: Self-pay | Admitting: Medical Oncology

## 2020-01-18 VITALS — BP 106/69 | HR 70 | Temp 98.4°F | Resp 18 | Ht 64.0 in | Wt 139.4 lb

## 2020-01-18 DIAGNOSIS — N183 Chronic kidney disease, stage 3 unspecified: Secondary | ICD-10-CM | POA: Diagnosis not present

## 2020-01-18 DIAGNOSIS — Z79899 Other long term (current) drug therapy: Secondary | ICD-10-CM | POA: Diagnosis not present

## 2020-01-18 DIAGNOSIS — D631 Anemia in chronic kidney disease: Secondary | ICD-10-CM | POA: Diagnosis not present

## 2020-01-18 DIAGNOSIS — I129 Hypertensive chronic kidney disease with stage 1 through stage 4 chronic kidney disease, or unspecified chronic kidney disease: Secondary | ICD-10-CM | POA: Diagnosis not present

## 2020-01-18 DIAGNOSIS — D638 Anemia in other chronic diseases classified elsewhere: Secondary | ICD-10-CM

## 2020-01-18 LAB — CBC WITH DIFFERENTIAL (CANCER CENTER ONLY)
Abs Immature Granulocytes: 0.03 10*3/uL (ref 0.00–0.07)
Basophils Absolute: 0 10*3/uL (ref 0.0–0.1)
Basophils Relative: 1 %
Eosinophils Absolute: 0.2 10*3/uL (ref 0.0–0.5)
Eosinophils Relative: 3 %
HCT: 35 % — ABNORMAL LOW (ref 36.0–46.0)
Hemoglobin: 11.3 g/dL — ABNORMAL LOW (ref 12.0–15.0)
Immature Granulocytes: 1 %
Lymphocytes Relative: 20 %
Lymphs Abs: 1.3 10*3/uL (ref 0.7–4.0)
MCH: 29.2 pg (ref 26.0–34.0)
MCHC: 32.3 g/dL (ref 30.0–36.0)
MCV: 90.4 fL (ref 80.0–100.0)
Monocytes Absolute: 0.4 10*3/uL (ref 0.1–1.0)
Monocytes Relative: 7 %
Neutro Abs: 4.6 10*3/uL (ref 1.7–7.7)
Neutrophils Relative %: 68 %
Platelet Count: 349 10*3/uL (ref 150–400)
RBC: 3.87 MIL/uL (ref 3.87–5.11)
RDW: 17.2 % — ABNORMAL HIGH (ref 11.5–15.5)
WBC Count: 6.6 10*3/uL (ref 4.0–10.5)
nRBC: 0 % (ref 0.0–0.2)

## 2020-01-18 MED ORDER — EPOETIN ALFA-EPBX 40000 UNIT/ML IJ SOLN: 40000.0000 [IU] | Freq: Once | INTRAMUSCULAR | Status: DC

## 2020-01-18 MED ORDER — EPOETIN ALFA-EPBX 40000 UNIT/ML IJ SOLN
INTRAMUSCULAR | Status: AC
  Filled 2020-01-18: qty 1

## 2020-01-18 NOTE — Patient Instructions (Signed)

## 2020-01-18 NOTE — Progress Notes (Signed)
HGB 11.3 injection not needed today. Patient given a copy of her labs

## 2020-01-26 ENCOUNTER — Telehealth: Payer: Self-pay | Admitting: Endocrinology

## 2020-01-26 NOTE — Telephone Encounter (Signed)
Please advise 

## 2020-01-26 NOTE — Telephone Encounter (Signed)
Patient's daughter called stating the patient has not had an appetite and was wondering if maybe this was a side affect to a medication she is taking and also if there might be something she could take that would help her appetite? Ph# [phone]

## 2020-01-26 NOTE — Telephone Encounter (Signed)
Please reduce Vicotza to 1.2 mg qd.

## 2020-01-26 NOTE — Telephone Encounter (Signed)
Noted, patient is aware. 

## 2020-01-31 ENCOUNTER — Encounter (HOSPITAL_COMMUNITY): Payer: Self-pay | Admitting: *Deleted

## 2020-01-31 ENCOUNTER — Telehealth: Payer: Self-pay | Admitting: *Deleted

## 2020-01-31 DIAGNOSIS — Z794 Long term (current) use of insulin: Secondary | ICD-10-CM | POA: Diagnosis not present

## 2020-01-31 DIAGNOSIS — N183 Chronic kidney disease, stage 3 unspecified: Secondary | ICD-10-CM | POA: Diagnosis not present

## 2020-01-31 DIAGNOSIS — Z7982 Long term (current) use of aspirin: Secondary | ICD-10-CM | POA: Diagnosis not present

## 2020-01-31 DIAGNOSIS — Z79899 Other long term (current) drug therapy: Secondary | ICD-10-CM | POA: Insufficient documentation

## 2020-01-31 DIAGNOSIS — R531 Weakness: Secondary | ICD-10-CM | POA: Diagnosis not present

## 2020-01-31 DIAGNOSIS — I10 Essential (primary) hypertension: Secondary | ICD-10-CM | POA: Diagnosis not present

## 2020-01-31 DIAGNOSIS — J449 Chronic obstructive pulmonary disease, unspecified: Secondary | ICD-10-CM | POA: Diagnosis not present

## 2020-01-31 DIAGNOSIS — Z87891 Personal history of nicotine dependence: Secondary | ICD-10-CM | POA: Insufficient documentation

## 2020-01-31 DIAGNOSIS — E1165 Type 2 diabetes mellitus with hyperglycemia: Secondary | ICD-10-CM | POA: Diagnosis not present

## 2020-01-31 DIAGNOSIS — I129 Hypertensive chronic kidney disease with stage 1 through stage 4 chronic kidney disease, or unspecified chronic kidney disease: Secondary | ICD-10-CM | POA: Insufficient documentation

## 2020-01-31 DIAGNOSIS — E1122 Type 2 diabetes mellitus with diabetic chronic kidney disease: Secondary | ICD-10-CM | POA: Insufficient documentation

## 2020-01-31 DIAGNOSIS — R739 Hyperglycemia, unspecified: Secondary | ICD-10-CM | POA: Diagnosis present

## 2020-01-31 LAB — CBC
HCT: 34.8 % — ABNORMAL LOW (ref 36.0–46.0)
Hemoglobin: 11.2 g/dL — ABNORMAL LOW (ref 12.0–15.0)
MCH: 28.8 pg (ref 26.0–34.0)
MCHC: 32.2 g/dL (ref 30.0–36.0)
MCV: 89.5 fL (ref 80.0–100.0)
Platelets: 315 10*3/uL (ref 150–400)
RBC: 3.89 MIL/uL (ref 3.87–5.11)
RDW: 15.8 % — ABNORMAL HIGH (ref 11.5–15.5)
WBC: 8.3 10*3/uL (ref 4.0–10.5)
nRBC: 0 % (ref 0.0–0.2)

## 2020-01-31 LAB — BASIC METABOLIC PANEL
Anion gap: 10 (ref 5–15)
BUN: 42 mg/dL — ABNORMAL HIGH (ref 8–23)
CO2: 26 mmol/L (ref 22–32)
Calcium: 9.9 mg/dL (ref 8.9–10.3)
Chloride: 92 mmol/L — ABNORMAL LOW (ref 98–111)
Creatinine, Ser: 2.81 mg/dL — ABNORMAL HIGH (ref 0.44–1.00)
GFR, Estimated: 17 mL/min — ABNORMAL LOW (ref >60)
Glucose, Bld: 519 mg/dL (ref 70–99)
Potassium: 4 mmol/L (ref 3.5–5.1)
Sodium: 128 mmol/L — ABNORMAL LOW (ref 135–145)

## 2020-01-31 NOTE — Telephone Encounter (Signed)
1.  Please verify no n/v/sob (if so, go to ER now) 2.  Then move up next appt to next available.

## 2020-01-31 NOTE — Telephone Encounter (Signed)
Pt daughter stated --pt had labs done today by [facility] -- glucose 600. Please advise

## 2020-01-31 NOTE — ED Triage Notes (Signed)
Per EMS pt is hyperglycemic, CBG 579. Complains of weakness, polyuria. She is not sure if she took her diabetes medications.   BP 140/86 HR 74 RR 20 CBG 579 SpO2: 99

## 2020-02-01 ENCOUNTER — Inpatient Hospital Stay: Payer: Medicare HMO

## 2020-02-01 ENCOUNTER — Emergency Department (HOSPITAL_COMMUNITY)
Admission: EM | Admit: 2020-02-01 | Discharge: 2020-02-01 | Disposition: A | Discharge status: Home / Self Care | Payer: Medicare HMO | Attending: Emergency Medicine | Admitting: Emergency Medicine

## 2020-02-01 ENCOUNTER — Inpatient Hospital Stay: Payer: Medicare HMO | Attending: Oncology

## 2020-02-01 DIAGNOSIS — R739 Hyperglycemia, unspecified: Secondary | ICD-10-CM

## 2020-02-01 DIAGNOSIS — E1165 Type 2 diabetes mellitus with hyperglycemia: Secondary | ICD-10-CM | POA: Diagnosis not present

## 2020-02-01 LAB — URINALYSIS, ROUTINE W REFLEX MICROSCOPIC
Bilirubin Urine: NEGATIVE
Glucose, UA: >=500 mg/dL — AB
Hgb urine dipstick: NEGATIVE
Ketones, ur: NEGATIVE mg/dL
Nitrite: NEGATIVE
Protein, ur: NEGATIVE mg/dL
Specific Gravity, Urine: 1.01 (ref 1.005–1.030)
pH: 5.5 (ref 5.0–8.0)

## 2020-02-01 LAB — URINALYSIS, MICROSCOPIC (REFLEX)

## 2020-02-01 LAB — BLOOD GAS, VENOUS
Acid-Base Excess: 1.6 mmol/L (ref 0.0–2.0)
Bicarbonate: 27 mmol/L (ref 20.0–28.0)
O2 Saturation: 51.1 %
Patient temperature: 98.6
pCO2, Ven: 48.9 mmHg (ref 44.0–60.0)
pH, Ven: 7.361 (ref 7.250–7.430)
pO2, Ven: 31.7 mmHg — CL (ref 32.0–45.0)

## 2020-02-01 LAB — CBG MONITORING, ED
Glucose-Capillary: 430 mg/dL — ABNORMAL HIGH (ref 70–99)
Glucose-Capillary: 390 mg/dL — ABNORMAL HIGH (ref 70–99)

## 2020-02-01 LAB — BASIC METABOLIC PANEL
Anion gap: 10 (ref 5–15)
BUN: 44 mg/dL — ABNORMAL HIGH (ref 8–23)
CO2: 25 mmol/L (ref 22–32)
Calcium: 9.9 mg/dL (ref 8.9–10.3)
Chloride: 93 mmol/L — ABNORMAL LOW (ref 98–111)
Creatinine, Ser: 2.73 mg/dL — ABNORMAL HIGH (ref 0.44–1.00)
GFR, Estimated: 18 mL/min — ABNORMAL LOW (ref >60)
Glucose, Bld: 478 mg/dL — ABNORMAL HIGH (ref 70–99)
Potassium: 4 mmol/L (ref 3.5–5.1)
Sodium: 128 mmol/L — ABNORMAL LOW (ref 135–145)

## 2020-02-01 MED ORDER — INSULIN ASPART 100 UNIT/ML ~~LOC~~ SOLN
7.0000 [IU] | Freq: Once | SUBCUTANEOUS | Status: AC
  Administered 2020-02-01: 7 [IU] via SUBCUTANEOUS
  Filled 2020-02-01: qty 0.07

## 2020-02-01 MED ORDER — SODIUM CHLORIDE 0.9 % IV BOLUS (SEPSIS)
1000.0000 mL | Freq: Once | INTRAVENOUS | Status: AC
  Administered 2020-02-01: 1000 mL via INTRAVENOUS

## 2020-02-01 MED ORDER — ACETAMINOPHEN 500 MG PO TABS: 1000.0000 mg | ORAL_TABLET | Freq: Once | ORAL | Status: DC

## 2020-02-01 MED ORDER — SODIUM CHLORIDE 0.9 % IV SOLN
1000.0000 mL | INTRAVENOUS | Status: DC
  Administered 2020-02-01: 1000 mL via INTRAVENOUS

## 2020-02-01 MED ORDER — INSULIN DETEMIR 100 UNIT/ML ~~LOC~~ SOLN
50.0000 [IU] | Freq: Once | SUBCUTANEOUS | Status: DC
  Filled 2020-02-01: qty 0.5

## 2020-02-01 NOTE — ED Notes (Signed)
Date and time results received: 02/01/20 0525  Test: Po2 Critical Value: 31.7  Name of Provider Notified: [place], EDP

## 2020-02-01 NOTE — Discharge Instructions (Addendum)
Transitional care team will contact her daughter [name] who assists with establishing home health to help manage your medications.  Please take Levemir 50 units each morning.   Please continue the same Victoza once daily.     check your blood sugar twice a day.  vary the time of day when you check, between before the 3 meals, and at bedtime.  also check if you have symptoms of your blood sugar being too high or too low.  please keep a record of the readings.

## 2020-02-01 NOTE — TOC Transition Note (Addendum)
Transition of Care [facility]) - CM/SW Discharge Note   Patient Details  Name: Courtney Goodman MRN: [id number] Date of Birth: 09-20-1945  Transition of [facility]) CM/SW Contact:  Ross Ludwig, LCSW Phone Number: 02/01/2020, 1:57 PM   Clinical Narrative:     Patient discharged home with daughter.  Patient has used [facility] in the past they can accept her back.  CSW spoke to [place] at [place], they can accept patient.   Patient will be going home with home health PT, RN, Aide, and social work through [employer].  CSW signing off please reconsult with any other social work needs, home health agency has been notified of planned discharge.   Patient Goals and CMS Choice        Discharge Placement  Patient discharging back home with daughter.                     Discharge Plan and Services                                     Social Determinants of Health (SDOH) Interventions     Readmission Risk Interventions No flowsheet data found.

## 2020-02-01 NOTE — ED Notes (Signed)
Pt visibly upset. Says she had "about $400 and the man who brought me here put it away." Writer searched pt's purse with her permission but was unable to find it. Pt refused to remain seated and demanded to go to lobby. Writer wheeled pt to lobby and spoke to her daughter [name], who confirmed pt arrived with that money. Once in lobby, pt found her money in her shoe." Daughter notified.

## 2020-02-01 NOTE — ED Provider Notes (Addendum)
[facility]-EMERGENCY DEPT Provider Note  CSN: [id number] Arrival date & time: 01/31/20 1847  Chief Complaint(s) Hyperglycemia  HPI Courtney Goodman is a 75 y.o. female with a history of type 2 insulin-dependent diabetes here with hyperglycemia.  On record review there was question of possible memory loss.  Patient reports that at times she forgets to take her medication.  She is unsure of the last time that she took her meds.  Denies any recent fevers or infections.  No chest pain or shortness of breath.  She does endorse nausea without emesis.  No abdominal pain or diarrhea.  No other physical complaints.  HPI  Past Medical History Past Medical History:  Diagnosis Date  . Anemia of chronic disease   . Asthma   . Atherosclerotic heart disease of native coronary artery without angina pectoris   . CKD (chronic kidney disease)   . COPD (chronic obstructive pulmonary disease) (HCC)   . DM (diabetes mellitus), type 2 with renal complications (HCC)   . Dysrhythmia   . Elevated ferritin 01/03/2017  . GAD (generalized anxiety disorder)   . Hypertension   . Mixed hyperlipidemia due to type 2 diabetes mellitus (HCC)   . Mixed incontinence    per medical records from [facility]  . Osteopenia   . Personal history of noncompliance with medical treatment, presenting hazards to health   . Shortness of breath dyspnea   . Suicidal ideation   . TB (tuberculosis), treated    age 18  . Vitamin D deficiency    Patient Active Problem List   Diagnosis Date Noted  . Poor appetite 09/29/2019  . Assistance needed with transportation 09/29/2019  . Medication noncompliance due to cognitive impairment 10/12/2018  . Severe episode of recurrent major depressive disorder, without psychotic features (HCC) 10/12/2018  . Acute renal failure superimposed on stage 3 chronic kidney disease (HCC) 09/22/2018  . Leukocytosis 09/22/2018  . Non-intractable vomiting   . GERD (gastroesophageal reflux  disease) 08/23/2017  . CAD (coronary artery disease) 08/23/2017  . Hypoglycemia 08/23/2017  . Acute metabolic encephalopathy 08/23/2017  . Hypokalemia 08/23/2017  . Type II diabetes mellitus with renal manifestations (HCC) 08/23/2017  . Elevated ferritin 01/03/2017  . Personal history of noncompliance with medical treatment, presenting hazards to health   . Urinary incontinence, mixed 05/27/2016  . Major depression, recurrent (HCC) 05/27/2016  . Atherosclerotic heart disease of native coronary artery without angina pectoris 05/27/2016  . GAD (generalized anxiety disorder)   . Anemia of chronic disease   . Mixed hyperlipidemia due to type 2 diabetes mellitus (HCC)   . Diabetes (HCC) 11/30/2015  . Vitamin D deficiency 11/23/2015  . HTN (hypertension) 11/23/2015  . Anemia 11/23/2015  . CKD (chronic kidney disease), stage III (HCC) 11/23/2015  . Dysrhythmia   . COPD (chronic obstructive pulmonary disease) (HCC)    Home Medication(s) Prior to Admission medications   Medication Sig Start Date End Date Taking? Authorizing Provider  albuterol (PROVENTIL HFA;VENTOLIN HFA) 108 (90 Base) MCG/ACT inhaler Inhale 2 puffs into the lungs every 6 (six) hours as needed for wheezing or shortness of breath. 04/14/17  Yes Henson, Vickie L, NP-C  amLODipine-benazepril (LOTREL) 5-20 MG capsule Take 1 capsule by mouth daily. 09/06/19  Yes Henson, Vickie L, NP-C  aspirin EC 81 MG tablet Take 81 mg by mouth daily.    Yes [provider]  Biotin 10000 MCG TABS Take 1 tablet by mouth daily.    Yes [provider]  carvedilol (COREG)   6.25 MG tablet Take 1 tablet by mouth twice daily. 09/06/19  Yes Henson, Vickie L, NP-C  Cholecalciferol 25 MCG (1000 UT) tablet Take 1 tablet by mouth daily. 08/11/19  Yes Henson, Vickie L, NP-C  citalopram (CELEXA) 20 MG tablet Take 1 tablet (20 mg total) by mouth daily. Patient taking differently: Take 30 mg by mouth daily. 09/05/16  Yes Henson, Vickie L, NP-C   fenofibrate (TRICOR) 145 MG tablet Take 1 tablet by mouth daily. 09/06/19  Yes Henson, Vickie L, NP-C  fluticasone (FLONASE) 50 MCG/ACT nasal spray Place 2 sprays into both nostrils daily. 07/25/16  Yes Henson, Vickie L, NP-C  folic acid (FOLVITE) 1 MG tablet Take 1 tablet (1 mg total) by mouth daily. 08/09/19  Yes Henson, Vickie L, NP-C  insulin detemir (LEVEMIR) 100 UNIT/ML FlexPen Inject 50 Units into the skin every morning. 11/26/19  Yes Ellison, Sean, MD  mirtazapine (REMERON) 15 MG tablet Take 7.5 mg by mouth at bedtime. 08/21/19  Yes [provider]  omeprazole (PRILOSEC) 40 MG capsule TAKE 1 CAPSULE(40 MG) BY MOUTH DAILY Patient taking differently: Take 40 mg by mouth daily. 12/13/19  Yes Henson, Vickie L, NP-C  simvastatin (ZOCOR) 20 MG tablet Take 1 tablet by mouth every night. 09/06/19  Yes Henson, Vickie L, NP-C  traZODone (DESYREL) 50 MG tablet Take 50 mg by mouth at bedtime.    Yes [provider]  VICTOZA 18 MG/3ML SOPN Inject 1.8 mg subcutaneously daily. 08/12/19  Yes Ellison, Sean, MD  Alcohol Swabs (ALCOHOL PREP) PADS Use for testing Patient taking differently: 1 each by Other route 2 (two) times daily. E11.9 08/07/18   Henson, Vickie L, NP-C  amoxicillin (AMOXIL) 500 MG capsule Take 1 capsule (500 mg total) by mouth 2 (two) times daily. Patient not taking: Reported on 02/01/2020 10/12/19   Glick, David, MD  Blood Glucose Monitoring Suppl (TRUE METRIX METER) w/Device KIT 1 each by Does not apply route 2 (two) times daily. Use to monitor glucose levels BID; E11.29 Patient taking differently: 1 each by Does not apply route 2 (two) times daily. E11.29 10/16/18   Ellison, Sean, MD  Insulin Pen Needle 31G X 5 MM MISC Inject 1 each into the skin 2 (two) times daily. Use to inject insulin twice daily; E11.29 Patient taking differently: Inject 1 each into the skin 2 (two) times daily. E11.29 03/05/19   Ellison, Sean, MD  TRUE METRIX BLOOD GLUCOSE TEST test strip TEST TWICE DAILY  11/16/19   Ellison, Sean, MD                                                                                                                                    Past Surgical History Past Surgical History:  Procedure Laterality Date  . ANKLE RECONSTRUCTION     right  . CARPAL TUNNEL RELEASE    . CORONARY ANGIOPLASTY     Family History Family History  Problem   Relation Age of Onset  . Diabetes Mother     Social History Social History   Tobacco Use  . Smoking status: Former Research scientist (life sciences)  . Smokeless tobacco: Never Used  Vaping Use  . Vaping Use: Never used  Substance Use Topics  . Alcohol use: No  . Drug use: No   Allergies Patient has no known allergies.  Review of Systems Review of Systems All other systems are reviewed and are negative for acute change except as noted in the HPI  Physical Exam Vital Signs  I have reviewed the triage vital signs BP 139/71   Pulse (!) 57   Temp 98.6 F (37 C) (Oral)   Resp 18   SpO2 100%   Physical Exam Vitals reviewed.  Constitutional:      General: She is not in acute distress.    Appearance: She is well-developed and well-nourished. She is not diaphoretic.  HENT:     Head: Normocephalic and atraumatic.     Right Ear: External ear normal.     Left Ear: External ear normal.     Nose: Nose normal.  Eyes:     General: No scleral icterus.    Extraocular Movements: EOM normal.     Conjunctiva/sclera: Conjunctivae normal.  Neck:     Trachea: Phonation normal.  Cardiovascular:     Rate and Rhythm: Normal rate and regular rhythm.  Pulmonary:     Effort: Pulmonary effort is normal. No respiratory distress.     Breath sounds: No stridor.  Abdominal:     General: There is no distension.     Tenderness: There is no abdominal tenderness.  Musculoskeletal:        General: No edema. Normal range of motion.     Cervical back: Normal range of motion.  Neurological:     Mental Status: She is alert and oriented to person, place, and  time. She is confused.  Psychiatric:        Mood and Affect: Mood and affect normal.        Behavior: Behavior normal.     ED Results and Treatments Labs (all labs ordered are listed, but only abnormal results are displayed) Labs Reviewed  BASIC METABOLIC PANEL - Abnormal; Notable for the following components:      Result Value   Sodium 128 (*)    Chloride 92 (*)    Glucose, Bld 519 (*)    BUN 42 (*)    Creatinine, Ser 2.81 (*)    GFR, Estimated 17 (*)    All other components within normal limits  CBC - Abnormal; Notable for the following components:   Hemoglobin 11.2 (*)    HCT 34.8 (*)    RDW 15.8 (*)    All other components within normal limits  URINALYSIS, ROUTINE W REFLEX MICROSCOPIC - Abnormal; Notable for the following components:   Glucose, UA >=500 (*)    Leukocytes,Ua TRACE (*)    All other components within normal limits  BASIC METABOLIC PANEL - Abnormal; Notable for the following components:   Sodium 128 (*)    Chloride 93 (*)    Glucose, Bld 478 (*)    BUN 44 (*)    Creatinine, Ser 2.73 (*)    GFR, Estimated 18 (*)    All other components within normal limits  BLOOD GAS, VENOUS - Abnormal; Notable for the following components:   pO2, Ven 31.7 (*)    All other components within normal limits  URINALYSIS, MICROSCOPIC (REFLEX) -  Abnormal; Notable for the following components:   Bacteria, UA RARE (*)    All other components within normal limits  CBG MONITORING, ED - Abnormal; Notable for the following components:   Glucose-Capillary 430 (*)    All other components within normal limits  CBG MONITORING, ED - Abnormal; Notable for the following components:   Glucose-Capillary 390 (*)    All other components within normal limits  CBG MONITORING, ED  CBG MONITORING, ED  CBG MONITORING, ED  CBG MONITORING, ED                                                                                                                         EKG  EKG  Interpretation  Date/Time:    Ventricular Rate:    PR Interval:    QRS Duration:   QT Interval:    QTC Calculation:   R Axis:     Text Interpretation:        Radiology No results found.  Pertinent labs & imaging results that were available during my care of the patient were reviewed by me and considered in my medical decision making (see chart for details).  Medications Ordered in ED Medications  sodium chloride 0.9 % bolus 1,000 mL (1,000 mLs Intravenous New Bag/Given (Non-Interop) 02/01/20 0504)    Followed by  sodium chloride 0.9 % bolus 1,000 mL (1,000 mLs Intravenous New Bag/Given (Non-Interop) 02/01/20 0504)    Followed by  0.9 %  sodium chloride infusion (1,000 mLs Intravenous New Bag/Given 02/01/20 0505)  insulin aspart (novoLOG) injection 7 Units (7 Units Subcutaneous Given 02/01/20 0603)                                                                                                                                    Procedures .Critical Care Performed by: Fatima Blank, MD Authorized by: Fatima Blank, MD    CRITICAL CARE Performed by: Grayce Sessions Brunilda Eble Total critical care time: 34 minutes Critical care time was exclusive of separately billable procedures and treating other patients. Critical care was necessary to treat or prevent imminent or life-threatening deterioration. Critical care was time spent personally by me on the following activities: development of treatment plan with patient and/or surrogate as well as nursing, discussions with consultants, evaluation of patient's response to treatment, examination of patient, obtaining history from patient or surrogate, ordering and performing treatments and interventions, ordering  and review of laboratory studies, ordering and review of radiographic studies, pulse oximetry and re-evaluation of patient's condition.    (including critical care time)  Medical Decision Making / ED Course I have  reviewed the nursing notes for this encounter and the patient's prior records (if available in EHR or on provided paperwork).   Courtney Goodman was evaluated in Emergency Department on 02/01/2020 for the symptoms described in the history of present illness. She was evaluated in the context of the global COVID-19 pandemic, which necessitated consideration that the patient might be at risk for infection with the SARS-CoV-2 virus that causes COVID-19. Institutional protocols and algorithms that pertain to the evaluation of patients at risk for COVID-19 are in a state of rapid change based on information released by regulatory bodies including the CDC and federal and state organizations. These policies and algorithms were followed during the patient's care in the ED.  Patient is here for hyperglycemia. Work-up confirmed hyperglycemia without evidence of DKA.  No infectious symptoms urine is negative for UTI. Treated with IV fluids and subcu insulin.  Spoke to the patient's daughter Mrs. Wynetta Emery, who reports that the patient does forget to take her medicine.  Reports that she lives with her grandson.  States that they tried to help her manage her medicine and checking on her every day but they are not able to provide 24-hour care for her.  They report that they are in the process of trying to get reevaluation for home health.  Patient does appear to have memory problems.  Likely early onset undiagnosed dementia.  She has had recent MRIs that have shown old basal ganglia stroke.   We will place a TOC consult for home health      Final Clinical Impression(s) / ED Diagnoses Final diagnoses:  Hyperglycemia   The patient appears reasonably screened and/or stabilized for discharge and I doubt any other medical condition or other [facility] requiring further screening, evaluation, or treatment in the ED at this time prior to discharge. Safe for discharge with strict return precautions.  Disposition:  Discharge  Condition: Good  I have discussed the results, Dx and Tx plan with the patient/family who expressed understanding and agree(s) with the plan. Discharge instructions discussed at length. The patient/family was given strict return precautions who verbalized understanding of the instructions. No further questions at time of discharge.    ED Discharge Orders    None       Follow Up: [name], NP-C [place] [phone]  Call  To schedule an appointment for close follow up      This chart was dictated using voice recognition software.  Despite best efforts to proofread,  errors can occur which can change the documentation meaning.   Fatima Blank, MD 02/01/20 0700    Fatima Blank, MD 02/14/20 2352

## 2020-02-01 NOTE — Telephone Encounter (Signed)
Tried to contact pt daughter but unable to leave a message--VM is not set up

## 2020-02-01 NOTE — Telephone Encounter (Signed)
Courtney Goodman with [facility] [phone]- stated  does not think pt is not taking her medication  like supposed to be --reading today is 294  and APS referral been ordered --pt cognitive declining and needed help to make sure pt is taking her medication.

## 2020-02-03 NOTE — Telephone Encounter (Signed)
Finally contact with pt daughter --stated pt not having any symptoms n/v/sob and will try to call back to set appt

## 2020-02-09 DIAGNOSIS — I129 Hypertensive chronic kidney disease with stage 1 through stage 4 chronic kidney disease, or unspecified chronic kidney disease: Secondary | ICD-10-CM | POA: Diagnosis not present

## 2020-02-09 DIAGNOSIS — E1122 Type 2 diabetes mellitus with diabetic chronic kidney disease: Secondary | ICD-10-CM | POA: Diagnosis not present

## 2020-02-09 DIAGNOSIS — F333 Major depressive disorder, recurrent, severe with psychotic symptoms: Secondary | ICD-10-CM | POA: Diagnosis not present

## 2020-02-09 DIAGNOSIS — N1831 Chronic kidney disease, stage 3a: Secondary | ICD-10-CM | POA: Diagnosis not present

## 2020-02-09 DIAGNOSIS — J449 Chronic obstructive pulmonary disease, unspecified: Secondary | ICD-10-CM | POA: Diagnosis not present

## 2020-02-09 DIAGNOSIS — E1165 Type 2 diabetes mellitus with hyperglycemia: Secondary | ICD-10-CM | POA: Diagnosis not present

## 2020-02-09 DIAGNOSIS — I2510 Atherosclerotic heart disease of native coronary artery without angina pectoris: Secondary | ICD-10-CM | POA: Diagnosis not present

## 2020-02-09 DIAGNOSIS — F411 Generalized anxiety disorder: Secondary | ICD-10-CM | POA: Diagnosis not present

## 2020-02-09 DIAGNOSIS — E785 Hyperlipidemia, unspecified: Secondary | ICD-10-CM | POA: Diagnosis not present

## 2020-02-10 ENCOUNTER — Telehealth: Payer: Self-pay

## 2020-02-10 DIAGNOSIS — J449 Chronic obstructive pulmonary disease, unspecified: Secondary | ICD-10-CM | POA: Diagnosis not present

## 2020-02-10 DIAGNOSIS — F333 Major depressive disorder, recurrent, severe with psychotic symptoms: Secondary | ICD-10-CM | POA: Diagnosis not present

## 2020-02-10 DIAGNOSIS — I2510 Atherosclerotic heart disease of native coronary artery without angina pectoris: Secondary | ICD-10-CM | POA: Diagnosis not present

## 2020-02-10 DIAGNOSIS — F411 Generalized anxiety disorder: Secondary | ICD-10-CM | POA: Diagnosis not present

## 2020-02-10 DIAGNOSIS — N1831 Chronic kidney disease, stage 3a: Secondary | ICD-10-CM | POA: Diagnosis not present

## 2020-02-10 DIAGNOSIS — I129 Hypertensive chronic kidney disease with stage 1 through stage 4 chronic kidney disease, or unspecified chronic kidney disease: Secondary | ICD-10-CM | POA: Diagnosis not present

## 2020-02-10 DIAGNOSIS — E1122 Type 2 diabetes mellitus with diabetic chronic kidney disease: Secondary | ICD-10-CM | POA: Diagnosis not present

## 2020-02-10 DIAGNOSIS — E1165 Type 2 diabetes mellitus with hyperglycemia: Secondary | ICD-10-CM | POA: Diagnosis not present

## 2020-02-10 DIAGNOSIS — E785 Hyperlipidemia, unspecified: Secondary | ICD-10-CM | POA: Diagnosis not present

## 2020-02-10 NOTE — Telephone Encounter (Signed)
This has not been completed by me as I have not been in the office this week.

## 2020-02-10 NOTE — Telephone Encounter (Signed)
Courtney Goodman with [facility] called to follow up on form for personal care services for medication management.  Her t# [phone]

## 2020-02-11 DIAGNOSIS — H5202 Hypermetropia, left eye: Secondary | ICD-10-CM | POA: Diagnosis not present

## 2020-02-11 DIAGNOSIS — E1122 Type 2 diabetes mellitus with diabetic chronic kidney disease: Secondary | ICD-10-CM | POA: Diagnosis not present

## 2020-02-11 DIAGNOSIS — I2510 Atherosclerotic heart disease of native coronary artery without angina pectoris: Secondary | ICD-10-CM | POA: Diagnosis not present

## 2020-02-11 DIAGNOSIS — H3552 Pigmentary retinal dystrophy: Secondary | ICD-10-CM | POA: Diagnosis not present

## 2020-02-11 DIAGNOSIS — J449 Chronic obstructive pulmonary disease, unspecified: Secondary | ICD-10-CM | POA: Diagnosis not present

## 2020-02-11 DIAGNOSIS — H52223 Regular astigmatism, bilateral: Secondary | ICD-10-CM | POA: Diagnosis not present

## 2020-02-11 DIAGNOSIS — H25813 Combined forms of age-related cataract, bilateral: Secondary | ICD-10-CM | POA: Diagnosis not present

## 2020-02-11 DIAGNOSIS — E1165 Type 2 diabetes mellitus with hyperglycemia: Secondary | ICD-10-CM | POA: Diagnosis not present

## 2020-02-11 DIAGNOSIS — E119 Type 2 diabetes mellitus without complications: Secondary | ICD-10-CM | POA: Diagnosis not present

## 2020-02-11 DIAGNOSIS — F411 Generalized anxiety disorder: Secondary | ICD-10-CM | POA: Diagnosis not present

## 2020-02-11 DIAGNOSIS — H524 Presbyopia: Secondary | ICD-10-CM | POA: Diagnosis not present

## 2020-02-11 DIAGNOSIS — E785 Hyperlipidemia, unspecified: Secondary | ICD-10-CM | POA: Diagnosis not present

## 2020-02-11 DIAGNOSIS — F333 Major depressive disorder, recurrent, severe with psychotic symptoms: Secondary | ICD-10-CM | POA: Diagnosis not present

## 2020-02-11 DIAGNOSIS — H5211 Myopia, right eye: Secondary | ICD-10-CM | POA: Diagnosis not present

## 2020-02-11 DIAGNOSIS — I129 Hypertensive chronic kidney disease with stage 1 through stage 4 chronic kidney disease, or unspecified chronic kidney disease: Secondary | ICD-10-CM | POA: Diagnosis not present

## 2020-02-11 DIAGNOSIS — N1831 Chronic kidney disease, stage 3a: Secondary | ICD-10-CM | POA: Diagnosis not present

## 2020-02-14 NOTE — Telephone Encounter (Signed)
Let's check on this tomorrow in the office and please let her know that I have been out of the office for the past 6 days. Thanks.

## 2020-02-15 ENCOUNTER — Inpatient Hospital Stay: Payer: Medicare HMO

## 2020-02-15 DIAGNOSIS — E785 Hyperlipidemia, unspecified: Secondary | ICD-10-CM | POA: Diagnosis not present

## 2020-02-15 DIAGNOSIS — F333 Major depressive disorder, recurrent, severe with psychotic symptoms: Secondary | ICD-10-CM | POA: Diagnosis not present

## 2020-02-15 DIAGNOSIS — F411 Generalized anxiety disorder: Secondary | ICD-10-CM | POA: Diagnosis not present

## 2020-02-15 DIAGNOSIS — J449 Chronic obstructive pulmonary disease, unspecified: Secondary | ICD-10-CM | POA: Diagnosis not present

## 2020-02-15 DIAGNOSIS — N1831 Chronic kidney disease, stage 3a: Secondary | ICD-10-CM | POA: Diagnosis not present

## 2020-02-15 DIAGNOSIS — E1122 Type 2 diabetes mellitus with diabetic chronic kidney disease: Secondary | ICD-10-CM | POA: Diagnosis not present

## 2020-02-15 DIAGNOSIS — E1165 Type 2 diabetes mellitus with hyperglycemia: Secondary | ICD-10-CM | POA: Diagnosis not present

## 2020-02-15 DIAGNOSIS — I129 Hypertensive chronic kidney disease with stage 1 through stage 4 chronic kidney disease, or unspecified chronic kidney disease: Secondary | ICD-10-CM | POA: Diagnosis not present

## 2020-02-15 DIAGNOSIS — I2510 Atherosclerotic heart disease of native coronary artery without angina pectoris: Secondary | ICD-10-CM | POA: Diagnosis not present

## 2020-02-15 NOTE — Telephone Encounter (Signed)
Courtney Goodman has completed and I have faxed back

## 2020-02-17 ENCOUNTER — Telehealth: Payer: Self-pay

## 2020-02-17 DIAGNOSIS — E785 Hyperlipidemia, unspecified: Secondary | ICD-10-CM | POA: Diagnosis not present

## 2020-02-17 DIAGNOSIS — F333 Major depressive disorder, recurrent, severe with psychotic symptoms: Secondary | ICD-10-CM | POA: Diagnosis not present

## 2020-02-17 DIAGNOSIS — F411 Generalized anxiety disorder: Secondary | ICD-10-CM | POA: Diagnosis not present

## 2020-02-17 DIAGNOSIS — N1831 Chronic kidney disease, stage 3a: Secondary | ICD-10-CM | POA: Diagnosis not present

## 2020-02-17 DIAGNOSIS — J449 Chronic obstructive pulmonary disease, unspecified: Secondary | ICD-10-CM | POA: Diagnosis not present

## 2020-02-17 DIAGNOSIS — E1122 Type 2 diabetes mellitus with diabetic chronic kidney disease: Secondary | ICD-10-CM | POA: Diagnosis not present

## 2020-02-17 DIAGNOSIS — I2510 Atherosclerotic heart disease of native coronary artery without angina pectoris: Secondary | ICD-10-CM | POA: Diagnosis not present

## 2020-02-17 DIAGNOSIS — I129 Hypertensive chronic kidney disease with stage 1 through stage 4 chronic kidney disease, or unspecified chronic kidney disease: Secondary | ICD-10-CM | POA: Diagnosis not present

## 2020-02-17 DIAGNOSIS — E1165 Type 2 diabetes mellitus with hyperglycemia: Secondary | ICD-10-CM | POA: Diagnosis not present

## 2020-02-17 NOTE — Telephone Encounter (Signed)
Please let her know that if she is getting worse that she may need to be seen for this.

## 2020-02-17 NOTE — Telephone Encounter (Signed)
Pt. Aware to call back if its getting worse. She said she was doing fine.

## 2020-02-17 NOTE — Telephone Encounter (Signed)
A nurse with [facility] called stating she is at Courtney Goodman's home and she told her she fell on the ice two days ago and her lt. Knee is sore but she is refusing to go to the doctor for it. Just informing you of what is going on.

## 2020-02-18 ENCOUNTER — Encounter: Payer: Self-pay | Admitting: Family Medicine

## 2020-02-19 NOTE — Telephone Encounter (Signed)
done

## 2020-02-28 ENCOUNTER — Ambulatory Visit: Payer: Medicare HMO | Admitting: Neurology

## 2020-02-28 DIAGNOSIS — I129 Hypertensive chronic kidney disease with stage 1 through stage 4 chronic kidney disease, or unspecified chronic kidney disease: Secondary | ICD-10-CM | POA: Diagnosis not present

## 2020-02-28 DIAGNOSIS — E1122 Type 2 diabetes mellitus with diabetic chronic kidney disease: Secondary | ICD-10-CM | POA: Diagnosis not present

## 2020-02-28 DIAGNOSIS — I2510 Atherosclerotic heart disease of native coronary artery without angina pectoris: Secondary | ICD-10-CM | POA: Diagnosis not present

## 2020-02-28 DIAGNOSIS — N1831 Chronic kidney disease, stage 3a: Secondary | ICD-10-CM | POA: Diagnosis not present

## 2020-02-28 DIAGNOSIS — E1165 Type 2 diabetes mellitus with hyperglycemia: Secondary | ICD-10-CM | POA: Diagnosis not present

## 2020-02-28 DIAGNOSIS — F411 Generalized anxiety disorder: Secondary | ICD-10-CM | POA: Diagnosis not present

## 2020-02-28 DIAGNOSIS — J449 Chronic obstructive pulmonary disease, unspecified: Secondary | ICD-10-CM | POA: Diagnosis not present

## 2020-02-28 DIAGNOSIS — E785 Hyperlipidemia, unspecified: Secondary | ICD-10-CM | POA: Diagnosis not present

## 2020-02-28 DIAGNOSIS — F333 Major depressive disorder, recurrent, severe with psychotic symptoms: Secondary | ICD-10-CM | POA: Diagnosis not present

## 2020-02-29 ENCOUNTER — Inpatient Hospital Stay: Payer: Medicare HMO

## 2020-02-29 ENCOUNTER — Inpatient Hospital Stay: Payer: Medicare HMO | Attending: Oncology | Admitting: Internal Medicine

## 2020-02-29 DIAGNOSIS — Z79899 Other long term (current) drug therapy: Secondary | ICD-10-CM | POA: Insufficient documentation

## 2020-02-29 DIAGNOSIS — I129 Hypertensive chronic kidney disease with stage 1 through stage 4 chronic kidney disease, or unspecified chronic kidney disease: Secondary | ICD-10-CM | POA: Insufficient documentation

## 2020-02-29 DIAGNOSIS — N189 Chronic kidney disease, unspecified: Secondary | ICD-10-CM | POA: Insufficient documentation

## 2020-02-29 DIAGNOSIS — D631 Anemia in chronic kidney disease: Secondary | ICD-10-CM | POA: Insufficient documentation

## 2020-03-02 ENCOUNTER — Ambulatory Visit (INDEPENDENT_AMBULATORY_CARE_PROVIDER_SITE_OTHER): Payer: Medicare HMO | Admitting: Endocrinology

## 2020-03-02 ENCOUNTER — Other Ambulatory Visit: Payer: Self-pay

## 2020-03-02 ENCOUNTER — Ambulatory Visit: Payer: Medicare HMO | Admitting: Endocrinology

## 2020-03-02 VITALS — BP 110/50 | HR 71 | Ht 64.0 in | Wt 140.4 lb

## 2020-03-02 DIAGNOSIS — E1122 Type 2 diabetes mellitus with diabetic chronic kidney disease: Secondary | ICD-10-CM | POA: Diagnosis not present

## 2020-03-02 DIAGNOSIS — Z794 Long term (current) use of insulin: Secondary | ICD-10-CM

## 2020-03-02 DIAGNOSIS — N1831 Chronic kidney disease, stage 3a: Secondary | ICD-10-CM

## 2020-03-02 LAB — POCT GLYCOSYLATED HEMOGLOBIN (HGB A1C): Hemoglobin A1C: 8.2 % — AB (ref 4.0–5.6)

## 2020-03-02 MED ORDER — NOVOLIN N FLEXPEN 100 UNIT/ML ~~LOC~~ SUPN: 30.0000 [IU] | PEN_INJECTOR | SUBCUTANEOUS | 11 refills | Status: DC

## 2020-03-02 NOTE — Patient Instructions (Signed)
Goals Addressed            This Visit's Progress   . COMPLETED: Set My Target A1C       Timeframe:  Long-Range Goal Priority:  High Start Date:   03/02/20                          Expected End Date:    09/27/20                  Follow Up Date 06/27/20   - set target A1C    Why is this important?   Your target A1C is decided together by you and your doctor.  It is based on several things like your age and other health issues.    Notes: MD goal of 8 or less    . THN-Monitor and Manage My Blood Sugar       Timeframe:  Long-Range Goal Priority:  High Start Date:   03/02/20                          Expected End Date:     09/27/20                 Follow Up Date 06/27/20   - check blood sugar if I feel it is too high or too low - take the blood sugar log to all doctor visits    Why is this important?   Checking your blood sugar at home helps to keep it from getting very high or very low.  Writing the results in a diary or log helps the doctor know how to care for you.  Your blood sugar log should have the time, date and the results.  Also, write down the amount of insulin or other medicine that you take.  Other information, like what you ate, exercise done and how you were feeling, will also be helpful.     Notes: Patient keeps log of blood sugars.  A1c 7.9. 03/02/20 Patient sugars ranging from around 95- to the 200's.  Patient sometimes does not follow diet.

## 2020-03-02 NOTE — Patient Outreach (Addendum)
[facility]) Care Management  [place]  03/02/2020   Courtney Goodman Jun 01, 1945 [id number]  Subjective: Spoke with Daughter Courtney Goodman. She reports patient appetite has decreased and caused some lows with her sugars. Patient to see Dr. Loanne Drilling today for evaluation.  Discussed sugar management.  Patient also fell since last call daughter states patient went to check her mail in the ice.  She states patient has been urged not go out by herself but she does any way.  Advised to continues to encourage patient.  She verbalized understanding.    Objective:   Encounter Medications:  Outpatient Encounter Medications as of 03/02/2020  Medication Sig Note  . albuterol (PROVENTIL HFA;VENTOLIN HFA) 108 (90 Base) MCG/ACT inhaler Inhale 2 puffs into the lungs every 6 (six) hours as needed for wheezing or shortness of breath.   . Alcohol Swabs (ALCOHOL PREP) PADS Use for testing (Patient taking differently: 1 each by Other route 2 (two) times daily. E11.9)   . amLODipine-benazepril (LOTREL) 5-20 MG capsule Take 1 capsule by mouth daily.   Marland Kitchen amoxicillin (AMOXIL) 500 MG capsule Take 1 capsule (500 mg total) by mouth 2 (two) times daily. (Patient not taking: Reported on 02/01/2020)   . aspirin EC 81 MG tablet Take 81 mg by mouth daily.    . Biotin 10000 MCG TABS Take 1 tablet by mouth daily.    . Blood Glucose Monitoring Suppl (TRUE METRIX METER) w/Device KIT 1 each by Does not apply route 2 (two) times daily. Use to monitor glucose levels BID; E11.29 (Patient taking differently: 1 each by Does not apply route 2 (two) times daily. E11.29)   . carvedilol (COREG) 6.25 MG tablet Take 1 tablet by mouth twice daily.   . Cholecalciferol 25 MCG (1000 UT) tablet Take 1 tablet by mouth daily.   . citalopram (CELEXA) 20 MG tablet Take 1 tablet (20 mg total) by mouth daily. (Patient taking differently: Take 30 mg by mouth daily.)   . fenofibrate (TRICOR) 145 MG tablet Take 1 tablet by mouth daily.   .  fluticasone (FLONASE) 50 MCG/ACT nasal spray Place 2 sprays into both nostrils daily.   . folic acid (FOLVITE) 1 MG tablet Take 1 tablet (1 mg total) by mouth daily.   . insulin detemir (LEVEMIR) 100 UNIT/ML FlexPen Inject 50 Units into the skin every morning. 02/01/2020: Pt thinks that her insulin dose may need changing  . Insulin Pen Needle 31G X 5 MM MISC Inject 1 each into the skin 2 (two) times daily. Use to inject insulin twice daily; E11.29 (Patient taking differently: Inject 1 each into the skin 2 (two) times daily. E11.29)   . mirtazapine (REMERON) 15 MG tablet Take 7.5 mg by mouth at bedtime.   Marland Kitchen omeprazole (PRILOSEC) 40 MG capsule TAKE 1 CAPSULE(40 MG) BY MOUTH DAILY (Patient taking differently: Take 40 mg by mouth daily.)   . simvastatin (ZOCOR) 20 MG tablet Take 1 tablet by mouth every night.   . traZODone (DESYREL) 50 MG tablet Take 50 mg by mouth at bedtime.    . TRUE METRIX BLOOD GLUCOSE TEST test strip TEST TWICE DAILY   . VICTOZA 18 MG/3ML SOPN Inject 1.8 mg subcutaneously daily.    No facility-administered encounter medications on file as of 03/02/2020.    Functional Status:  In your present state of health, do you have any difficulty performing the following activities: 10/07/2019 10/05/2019  Hearing? Y Y  Comment - Hard of hearing, wears hearing aides.  Vision?  N N  Difficulty concentrating or making decisions? N N  Walking or climbing stairs? N N  Dressing or bathing? Y N  Comment tiredness -  Doing errands, shopping? Aggie Moats  Comment daughter handles -  Conservation officer, nature and eating ? N N  Using the Toilet? N N  In the past six months, have you accidently leaked urine? N N  Do you have problems with loss of bowel control? N N  Managing your Medications? Y N  Comment patient forgetful -  Managing your Finances? Y N  Comment family assists -  Housekeeping or managing your Housekeeping? Y N  Comment family assists -  Some recent data might be hidden    Fall/Depression  Screening: Fall Risk  03/02/2020 10/28/2019 10/07/2019  Falls in the past year? 1 1 1   Number falls in past yr: 1 1 1   Injury with Fall? 1 1 1   Risk Factor Category  - - -  Risk for fall due to : History of fall(s) - -  Follow up Falls prevention discussed - -   PHQ 2/9 Scores 12/30/2019 10/05/2019 10/05/2019 09/29/2019 07/08/2019 10/12/2018 10/08/2018  PHQ - 2 Score - 0 0 0 0 6 1  PHQ- 9 Score - - - - - 24 -  Exception Documentation Other- indicate reason in comment box - - - - - -  Not completed daughter Courtney Goodman completed assessment - - - - - -    Assessment:  Goals Addressed            This Visit's Progress   . COMPLETED: Set My Target A1C       Timeframe:  Long-Range Goal Priority:  High Start Date:   03/02/20                          Expected End Date:    09/27/20                  Follow Up Date 06/27/20   - set target A1C    Why is this important?   Your target A1C is decided together by you and your doctor.  It is based on several things like your age and other health issues.    Notes: MD goal of 8 or less    . THN-Monitor and Manage My Blood Sugar       Timeframe:  Long-Range Goal Priority:  High Start Date:   03/02/20                          Expected End Date:     09/27/20                 Follow Up Date 06/27/20   - check blood sugar if I feel it is too high or too low - take the blood sugar log to all doctor visits    Why is this important?   Checking your blood sugar at home helps to keep it from getting very high or very low.  Writing the results in a diary or log helps the doctor know how to care for you.  Your blood sugar log should have the time, date and the results.  Also, write down the amount of insulin or other medicine that you take.  Other information, like what you ate, exercise done and how you were feeling, will also be helpful.     Notes: Patient keeps  log of blood sugars.  A1c 7.9. 03/02/20 Patient sugars ranging from around 95- to the 200's.  Patient  sometimes does not follow diet.         Plan: RN CM will follow up in May. Follow-up:  Patient agrees to Care Plan and Follow-up.   Jone Baseman, RN, MSN [facility] Care Management Coordinator Direct Line [phone] Cell [phone] Toll Free: [phone]  Fax: [phone]

## 2020-03-02 NOTE — Progress Notes (Signed)
Subjective:    Patient ID: Courtney Goodman, female    DOB: Jan 22, 1946, 75 y.o.   MRN: [id number]  HPI Pt returns for f/u of diabetes mellitus: DM type: Insulin-requiring type 2 Dx'ed: 4854 Complications: stage 4 CRI and CAD.  Therapy: insulin since 2009, and victoza. GDM: never DKA: never Severe hypoglycemia: 2012, 2019, and 2021.   Pancreatitis: never.  SDOH: ins declined tresiba;  Other: she is on qd insulin, due to h/o noncompliance; she declines to change to NPH.   Interval history: no cbg record, but states cbg varies from 100-200.  It is in general higher as the day goes on.  Pt had an episode of severe fasting hypoglycemia a few days ago.  cbg was 40 then.  She is losing weight.  She is here with dtr Courtney Goodman), who says the 1/22 ER visit was due to missing insulin.  Since then, a caretaker puts insulin out for pt, but she takes herself in the morning, when she is alone.   Past Medical History:  Diagnosis Date  . Anemia of chronic disease   . Asthma   . Atherosclerotic heart disease of native coronary artery without angina pectoris   . CKD (chronic kidney disease)   . COPD (chronic obstructive pulmonary disease) ([place])   . DM (diabetes mellitus), type 2 with renal complications ([place])   . Dysrhythmia   . Elevated ferritin 01/03/2017  . GAD (generalized anxiety disorder)   . Hypertension   . Mixed hyperlipidemia due to type 2 diabetes mellitus ([place])   . Mixed incontinence    per medical records from [place]  . Osteopenia   . Personal history of noncompliance with medical treatment, presenting hazards to health   . Shortness of breath dyspnea   . Suicidal ideation   . TB (tuberculosis), treated    age 49  . Vitamin D deficiency     Past Surgical History:  Procedure Laterality Date  . ANKLE RECONSTRUCTION     right  . CARPAL TUNNEL RELEASE    . CORONARY ANGIOPLASTY      Social History   Socioeconomic History  . Marital status: Divorced    Spouse name: Not on  file  . Number of children: 2  . Years of education: 77  . Highest education level: High school graduate  Occupational History  . Occupation: Retired  Tobacco Use  . Smoking status: Former Research scientist (life sciences)  . Smokeless tobacco: Never Used  Vaping Use  . Vaping Use: Never used  Substance and Sexual Activity  . Alcohol use: No  . Drug use: No  . Sexual activity: Never  Other Topics Concern  . Not on file  Social History Narrative   Lives alone    Left handed   Social Determinants of Health   Financial Resource Strain: Medium Risk  . Difficulty of Paying Living Expenses: Somewhat hard  Food Insecurity: No Food Insecurity  . Worried About Charity fundraiser in the Last Year: Never true  . Ran Out of Food in the Last Year: Never true  Transportation Needs: No Transportation Needs  . Lack of Transportation (Medical): No  . Lack of Transportation (Non-Medical): No  Physical Activity: Inactive  . Days of Exercise per Week: 0 days  . Minutes of Exercise per Session: 0 min  Stress: Stress Concern Present  . Feeling of Stress : To some extent  Social Connections: Moderately Isolated  . Frequency of Communication with Friends and Family: More than three times  a week  . Frequency of Social Gatherings with Friends and Family: More than three times a week  . Attends Religious Services: More than 4 times per year  . Active Member of Clubs or Organizations: No  . Attends Archivist Meetings: Never  . Marital Status: Divorced  Human resources officer Violence: Not At Risk  . Fear of Current or Ex-Partner: No  . Emotionally Abused: No  . Physically Abused: No  . Sexually Abused: No    Current Outpatient Medications on File Prior to Visit  Medication Sig Dispense Refill  . albuterol (PROVENTIL HFA;VENTOLIN HFA) 108 (90 Base) MCG/ACT inhaler Inhale 2 puffs into the lungs every 6 (six) hours as needed for wheezing or shortness of breath. 1 Inhaler 0  . Alcohol Swabs (ALCOHOL PREP) PADS Use  for testing (Patient taking differently: 1 each by Other route 2 (two) times daily. E11.9) 100 each 1  . amLODipine-benazepril (LOTREL) 5-20 MG capsule Take 1 capsule by mouth daily. 90 capsule 0  . amoxicillin (AMOXIL) 500 MG capsule Take 1 capsule (500 mg total) by mouth 2 (two) times daily. 10 capsule 0  . aspirin EC 81 MG tablet Take 81 mg by mouth daily.     . Biotin 10000 MCG TABS Take 1 tablet by mouth daily.     . Blood Glucose Monitoring Suppl (TRUE METRIX METER) w/Device KIT 1 each by Does not apply route 2 (two) times daily. Use to monitor glucose levels BID; E11.29 (Patient taking differently: 1 each by Does not apply route 2 (two) times daily. E11.29) 1 kit 0  . carvedilol (COREG) 6.25 MG tablet Take 1 tablet by mouth twice daily. 180 tablet 0  . Cholecalciferol 25 MCG (1000 UT) tablet Take 1 tablet by mouth daily. 90 tablet 0  . citalopram (CELEXA) 20 MG tablet Take 1 tablet (20 mg total) by mouth daily. (Patient taking differently: Take 30 mg by mouth daily.) 90 tablet 1  . fenofibrate (TRICOR) 145 MG tablet Take 1 tablet by mouth daily. 90 tablet 0  . fluticasone (FLONASE) 50 MCG/ACT nasal spray Place 2 sprays into both nostrils daily. 48 g 0  . folic acid (FOLVITE) 1 MG tablet Take 1 tablet (1 mg total) by mouth daily. 90 tablet 0  . mirtazapine (REMERON) 15 MG tablet Take 7.5 mg by mouth at bedtime.    Marland Kitchen omeprazole (PRILOSEC) 40 MG capsule TAKE 1 CAPSULE(40 MG) BY MOUTH DAILY (Patient taking differently: Take 40 mg by mouth daily.) 90 capsule 0  . simvastatin (ZOCOR) 20 MG tablet Take 1 tablet by mouth every night. 90 tablet 0  . traZODone (DESYREL) 50 MG tablet Take 50 mg by mouth at bedtime.     . TRUE METRIX BLOOD GLUCOSE TEST test strip TEST TWICE DAILY 200 strip 2  . VICTOZA 18 MG/3ML SOPN Inject 1.8 mg subcutaneously daily. 27 mL 0   No current facility-administered medications on file prior to visit.    No Known Allergies  Family History  Problem Relation Age of  Onset  . Diabetes Mother     BP (!) 110/50 (BP Location: Right Arm, Patient Position: Sitting, Cuff Size: Normal)   Pulse 71   Ht $R'5\' 4"'Sr$  (1.626 m)   Wt 140 lb 6.4 oz (63.7 kg)   SpO2 98%   BMI 24.10 kg/m   Review of Systems She has lost 7 lbs since last ov.      Objective:   Physical Exam VITAL SIGNS:  See vs page GENERAL:  no distress Pulses: dorsalis pedis intact bilat.   MSK: no deformity of the feet CV: no leg edema Skin:  no ulcer on the feet.  normal color and temp on the feet. Neuro: sensation is intact to touch on the feet Ext: there is bilateral onychomycosis of the toenails   Lab Results  Component Value Date   CREATININE 2.73 (H) 02/01/2020   BUN 44 (H) 02/01/2020   NA 128 (L) 02/01/2020   K 4.0 02/01/2020   CL 93 (L) 02/01/2020   CO2 25 02/01/2020   A1c=8.2%     Assessment & Plan:  Insulin-requiring type 2 DM, with stage 4 CRI.  Hypoglycemia, severe due to insulin: this limits aggressiveness of glycemic control.    Patient Instructions  Please change the Levemir to NPH, 30 units each morning.  It is important to swirl this before injecting.  Please continue the same Victoza.    check your blood sugar twice a day.  vary the time of day when you check, between before the 3 meals, and at bedtime.  also check if you have symptoms of your blood sugar being too high or too low.  please keep a record of the readings and bring it to your next appointment here (or you can bring the meter itself).  You can write it on any piece of paper.  please call us sooner if your blood sugar goes below 70, or if you have a lot of readings over 200. Please have a follow-up appointment in 2 weeks, by video, if you prefer.

## 2020-03-02 NOTE — Patient Instructions (Addendum)
Please change the Levemir to NPH, 30 units each morning.  It is important to swirl this before injecting.  Please continue the same Victoza.    check your blood sugar twice a day.  vary the time of day when you check, between before the 3 meals, and at bedtime.  also check if you have symptoms of your blood sugar being too high or too low.  please keep a record of the readings and bring it to your next appointment here (or you can bring the meter itself).  You can write it on any piece of paper.  please call us sooner if your blood sugar goes below 70, or if you have a lot of readings over 200. Please have a follow-up appointment in 2 weeks, by video, if you prefer.

## 2020-03-03 ENCOUNTER — Ambulatory Visit: Payer: Medicare HMO | Admitting: Endocrinology

## 2020-03-06 ENCOUNTER — Telehealth: Payer: Self-pay | Admitting: Medical Oncology

## 2020-03-06 NOTE — Telephone Encounter (Signed)
Message sent to  r/s lab and injection this week.

## 2020-03-07 ENCOUNTER — Ambulatory Visit: Payer: Medicare HMO | Admitting: Podiatry

## 2020-03-07 ENCOUNTER — Telehealth: Payer: Self-pay | Admitting: Internal Medicine

## 2020-03-07 DIAGNOSIS — E785 Hyperlipidemia, unspecified: Secondary | ICD-10-CM | POA: Diagnosis not present

## 2020-03-07 DIAGNOSIS — F333 Major depressive disorder, recurrent, severe with psychotic symptoms: Secondary | ICD-10-CM | POA: Diagnosis not present

## 2020-03-07 DIAGNOSIS — N1831 Chronic kidney disease, stage 3a: Secondary | ICD-10-CM | POA: Diagnosis not present

## 2020-03-07 DIAGNOSIS — F411 Generalized anxiety disorder: Secondary | ICD-10-CM | POA: Diagnosis not present

## 2020-03-07 DIAGNOSIS — I2510 Atherosclerotic heart disease of native coronary artery without angina pectoris: Secondary | ICD-10-CM | POA: Diagnosis not present

## 2020-03-07 DIAGNOSIS — E1165 Type 2 diabetes mellitus with hyperglycemia: Secondary | ICD-10-CM | POA: Diagnosis not present

## 2020-03-07 DIAGNOSIS — J449 Chronic obstructive pulmonary disease, unspecified: Secondary | ICD-10-CM | POA: Diagnosis not present

## 2020-03-07 DIAGNOSIS — E1122 Type 2 diabetes mellitus with diabetic chronic kidney disease: Secondary | ICD-10-CM | POA: Diagnosis not present

## 2020-03-07 DIAGNOSIS — I129 Hypertensive chronic kidney disease with stage 1 through stage 4 chronic kidney disease, or unspecified chronic kidney disease: Secondary | ICD-10-CM | POA: Diagnosis not present

## 2020-03-07 NOTE — Telephone Encounter (Signed)
Scheduled appt per 2/7 sch msg - pt is aware of apt scheduled.

## 2020-03-09 ENCOUNTER — Ambulatory Visit: Payer: Medicare HMO | Admitting: Neurology

## 2020-03-10 ENCOUNTER — Other Ambulatory Visit: Payer: Self-pay

## 2020-03-10 ENCOUNTER — Inpatient Hospital Stay: Payer: Medicare HMO

## 2020-03-10 VITALS — BP 107/65 | HR 72

## 2020-03-10 DIAGNOSIS — D631 Anemia in chronic kidney disease: Secondary | ICD-10-CM | POA: Diagnosis not present

## 2020-03-10 DIAGNOSIS — D638 Anemia in other chronic diseases classified elsewhere: Secondary | ICD-10-CM

## 2020-03-10 DIAGNOSIS — I129 Hypertensive chronic kidney disease with stage 1 through stage 4 chronic kidney disease, or unspecified chronic kidney disease: Secondary | ICD-10-CM | POA: Diagnosis not present

## 2020-03-10 DIAGNOSIS — N183 Chronic kidney disease, stage 3 unspecified: Secondary | ICD-10-CM

## 2020-03-10 DIAGNOSIS — Z79899 Other long term (current) drug therapy: Secondary | ICD-10-CM | POA: Diagnosis not present

## 2020-03-10 DIAGNOSIS — N189 Chronic kidney disease, unspecified: Secondary | ICD-10-CM | POA: Diagnosis not present

## 2020-03-10 LAB — CBC WITH DIFFERENTIAL (CANCER CENTER ONLY)
Abs Immature Granulocytes: 0.02 10*3/uL (ref 0.00–0.07)
Basophils Absolute: 0 10*3/uL (ref 0.0–0.1)
Basophils Relative: 1 %
Eosinophils Absolute: 0.1 10*3/uL (ref 0.0–0.5)
Eosinophils Relative: 2 %
HCT: 27.8 % — ABNORMAL LOW (ref 36.0–46.0)
Hemoglobin: 8.7 g/dL — ABNORMAL LOW (ref 12.0–15.0)
Immature Granulocytes: 0 %
Lymphocytes Relative: 22 %
Lymphs Abs: 1.5 10*3/uL (ref 0.7–4.0)
MCH: 28.2 pg (ref 26.0–34.0)
MCHC: 31.3 g/dL (ref 30.0–36.0)
MCV: 90 fL (ref 80.0–100.0)
Monocytes Absolute: 0.6 10*3/uL (ref 0.1–1.0)
Monocytes Relative: 9 %
Neutro Abs: 4.4 10*3/uL (ref 1.7–7.7)
Neutrophils Relative %: 66 %
Platelet Count: 282 10*3/uL (ref 150–400)
RBC: 3.09 MIL/uL — ABNORMAL LOW (ref 3.87–5.11)
RDW: 18.7 % — ABNORMAL HIGH (ref 11.5–15.5)
WBC Count: 6.7 10*3/uL (ref 4.0–10.5)
nRBC: 0 % (ref 0.0–0.2)

## 2020-03-10 LAB — IRON AND TIBC
Iron: 93 ug/dL (ref 41–142)
Saturation Ratios: 33 % (ref 21–57)
TIBC: 283 ug/dL (ref 236–444)
UIBC: 190 ug/dL (ref 120–384)

## 2020-03-10 LAB — FERRITIN: Ferritin: 454 ng/mL — ABNORMAL HIGH (ref 11–307)

## 2020-03-10 MED ORDER — EPOETIN ALFA-EPBX 40000 UNIT/ML IJ SOLN
INTRAMUSCULAR | Status: AC
  Filled 2020-03-10: qty 1

## 2020-03-10 MED ORDER — EPOETIN ALFA-EPBX 40000 UNIT/ML IJ SOLN
40000.0000 [IU] | Freq: Once | INTRAMUSCULAR | Status: AC
  Administered 2020-03-10: 40000 [IU] via SUBCUTANEOUS

## 2020-03-10 NOTE — Patient Instructions (Signed)

## 2020-03-12 ENCOUNTER — Other Ambulatory Visit: Payer: Self-pay | Admitting: Endocrinology

## 2020-03-12 DIAGNOSIS — Z794 Long term (current) use of insulin: Secondary | ICD-10-CM

## 2020-03-12 DIAGNOSIS — N183 Chronic kidney disease, stage 3 unspecified: Secondary | ICD-10-CM

## 2020-03-16 DIAGNOSIS — E1165 Type 2 diabetes mellitus with hyperglycemia: Secondary | ICD-10-CM | POA: Diagnosis not present

## 2020-03-16 DIAGNOSIS — I129 Hypertensive chronic kidney disease with stage 1 through stage 4 chronic kidney disease, or unspecified chronic kidney disease: Secondary | ICD-10-CM | POA: Diagnosis not present

## 2020-03-16 DIAGNOSIS — N1831 Chronic kidney disease, stage 3a: Secondary | ICD-10-CM | POA: Diagnosis not present

## 2020-03-16 DIAGNOSIS — E1122 Type 2 diabetes mellitus with diabetic chronic kidney disease: Secondary | ICD-10-CM | POA: Diagnosis not present

## 2020-03-16 DIAGNOSIS — J449 Chronic obstructive pulmonary disease, unspecified: Secondary | ICD-10-CM | POA: Diagnosis not present

## 2020-03-16 DIAGNOSIS — I2510 Atherosclerotic heart disease of native coronary artery without angina pectoris: Secondary | ICD-10-CM | POA: Diagnosis not present

## 2020-03-16 DIAGNOSIS — E785 Hyperlipidemia, unspecified: Secondary | ICD-10-CM | POA: Diagnosis not present

## 2020-03-16 DIAGNOSIS — F333 Major depressive disorder, recurrent, severe with psychotic symptoms: Secondary | ICD-10-CM | POA: Diagnosis not present

## 2020-03-16 DIAGNOSIS — F411 Generalized anxiety disorder: Secondary | ICD-10-CM | POA: Diagnosis not present

## 2020-03-17 ENCOUNTER — Ambulatory Visit: Payer: Medicare HMO | Admitting: Endocrinology

## 2020-03-17 DIAGNOSIS — J449 Chronic obstructive pulmonary disease, unspecified: Secondary | ICD-10-CM | POA: Diagnosis not present

## 2020-03-17 DIAGNOSIS — E1165 Type 2 diabetes mellitus with hyperglycemia: Secondary | ICD-10-CM | POA: Diagnosis not present

## 2020-03-17 DIAGNOSIS — F411 Generalized anxiety disorder: Secondary | ICD-10-CM | POA: Diagnosis not present

## 2020-03-17 DIAGNOSIS — N1831 Chronic kidney disease, stage 3a: Secondary | ICD-10-CM | POA: Diagnosis not present

## 2020-03-17 DIAGNOSIS — I2510 Atherosclerotic heart disease of native coronary artery without angina pectoris: Secondary | ICD-10-CM | POA: Diagnosis not present

## 2020-03-17 DIAGNOSIS — E1122 Type 2 diabetes mellitus with diabetic chronic kidney disease: Secondary | ICD-10-CM | POA: Diagnosis not present

## 2020-03-17 DIAGNOSIS — F333 Major depressive disorder, recurrent, severe with psychotic symptoms: Secondary | ICD-10-CM | POA: Diagnosis not present

## 2020-03-17 DIAGNOSIS — E785 Hyperlipidemia, unspecified: Secondary | ICD-10-CM | POA: Diagnosis not present

## 2020-03-17 DIAGNOSIS — I129 Hypertensive chronic kidney disease with stage 1 through stage 4 chronic kidney disease, or unspecified chronic kidney disease: Secondary | ICD-10-CM | POA: Diagnosis not present

## 2020-03-21 DIAGNOSIS — I2510 Atherosclerotic heart disease of native coronary artery without angina pectoris: Secondary | ICD-10-CM | POA: Diagnosis not present

## 2020-03-21 DIAGNOSIS — E1165 Type 2 diabetes mellitus with hyperglycemia: Secondary | ICD-10-CM | POA: Diagnosis not present

## 2020-03-21 DIAGNOSIS — E1122 Type 2 diabetes mellitus with diabetic chronic kidney disease: Secondary | ICD-10-CM | POA: Diagnosis not present

## 2020-03-21 DIAGNOSIS — E785 Hyperlipidemia, unspecified: Secondary | ICD-10-CM | POA: Diagnosis not present

## 2020-03-21 DIAGNOSIS — I129 Hypertensive chronic kidney disease with stage 1 through stage 4 chronic kidney disease, or unspecified chronic kidney disease: Secondary | ICD-10-CM | POA: Diagnosis not present

## 2020-03-21 DIAGNOSIS — F333 Major depressive disorder, recurrent, severe with psychotic symptoms: Secondary | ICD-10-CM | POA: Diagnosis not present

## 2020-03-21 DIAGNOSIS — F411 Generalized anxiety disorder: Secondary | ICD-10-CM | POA: Diagnosis not present

## 2020-03-21 DIAGNOSIS — J449 Chronic obstructive pulmonary disease, unspecified: Secondary | ICD-10-CM | POA: Diagnosis not present

## 2020-03-21 DIAGNOSIS — N1831 Chronic kidney disease, stage 3a: Secondary | ICD-10-CM | POA: Diagnosis not present

## 2020-03-28 DIAGNOSIS — F411 Generalized anxiety disorder: Secondary | ICD-10-CM | POA: Diagnosis not present

## 2020-03-28 DIAGNOSIS — I129 Hypertensive chronic kidney disease with stage 1 through stage 4 chronic kidney disease, or unspecified chronic kidney disease: Secondary | ICD-10-CM | POA: Diagnosis not present

## 2020-03-28 DIAGNOSIS — J449 Chronic obstructive pulmonary disease, unspecified: Secondary | ICD-10-CM | POA: Diagnosis not present

## 2020-03-28 DIAGNOSIS — E1122 Type 2 diabetes mellitus with diabetic chronic kidney disease: Secondary | ICD-10-CM | POA: Diagnosis not present

## 2020-03-28 DIAGNOSIS — E785 Hyperlipidemia, unspecified: Secondary | ICD-10-CM | POA: Diagnosis not present

## 2020-03-28 DIAGNOSIS — N1831 Chronic kidney disease, stage 3a: Secondary | ICD-10-CM | POA: Diagnosis not present

## 2020-03-28 DIAGNOSIS — I2510 Atherosclerotic heart disease of native coronary artery without angina pectoris: Secondary | ICD-10-CM | POA: Diagnosis not present

## 2020-03-28 DIAGNOSIS — F333 Major depressive disorder, recurrent, severe with psychotic symptoms: Secondary | ICD-10-CM | POA: Diagnosis not present

## 2020-03-28 DIAGNOSIS — E1165 Type 2 diabetes mellitus with hyperglycemia: Secondary | ICD-10-CM | POA: Diagnosis not present

## 2020-04-18 ENCOUNTER — Ambulatory Visit (INDEPENDENT_AMBULATORY_CARE_PROVIDER_SITE_OTHER): Payer: Medicare HMO | Admitting: Endocrinology

## 2020-04-18 ENCOUNTER — Other Ambulatory Visit: Payer: Self-pay

## 2020-04-18 VITALS — BP 132/62 | HR 68 | Ht 64.0 in | Wt 138.6 lb

## 2020-04-18 DIAGNOSIS — Z794 Long term (current) use of insulin: Secondary | ICD-10-CM | POA: Diagnosis not present

## 2020-04-18 DIAGNOSIS — E1122 Type 2 diabetes mellitus with diabetic chronic kidney disease: Secondary | ICD-10-CM

## 2020-04-18 DIAGNOSIS — N1831 Chronic kidney disease, stage 3a: Secondary | ICD-10-CM | POA: Diagnosis not present

## 2020-04-18 MED ORDER — NOVOLIN N FLEXPEN 100 UNIT/ML ~~LOC~~ SUPN: 15.0000 [IU] | PEN_INJECTOR | SUBCUTANEOUS | 11 refills | Status: DC

## 2020-04-18 NOTE — Progress Notes (Signed)
Subjective:    Patient ID: Courtney Goodman, female    DOB: 03-25-1945, 75 y.o.   MRN: [id number]  HPI Pt returns for f/u of diabetes mellitus:  DM type: Insulin-requiring type 2 Dx'ed: 8299 Complications: stage 4 CRI and CAD.  Therapy: insulin since 2009, and Victoza. GDM: never DKA: never Severe hypoglycemia: 2012, 2019, 2021, and 2022.   Pancreatitis: never.  SDOH: ins declined tresiba; a caretaker puts insulin out for pt, but she takes herself in the morning, when she is alone.   Other: she is on qd insulin, due to h/o noncompliance; she declines to change to NPH.   Interval history: no cbg record, but states cbg's vary from 38-170.  It is in general higher as the day goes on.    Past Medical History:  Diagnosis Date  . Anemia of chronic disease   . Asthma   . Atherosclerotic heart disease of native coronary artery without angina pectoris   . CKD (chronic kidney disease)   . COPD (chronic obstructive pulmonary disease) ([place])   . DM (diabetes mellitus), type 2 with renal complications ([place])   . Dysrhythmia   . Elevated ferritin 01/03/2017  . GAD (generalized anxiety disorder)   . Hypertension   . Mixed hyperlipidemia due to type 2 diabetes mellitus ([place])   . Mixed incontinence    per medical records from [place]  . Osteopenia   . Personal history of noncompliance with medical treatment, presenting hazards to health   . Shortness of breath dyspnea   . Suicidal ideation   . TB (tuberculosis), treated    age 76  . Vitamin D deficiency     Past Surgical History:  Procedure Laterality Date  . ANKLE RECONSTRUCTION     right  . CARPAL TUNNEL RELEASE    . CORONARY ANGIOPLASTY      Social History   Socioeconomic History  . Marital status: Divorced    Spouse name: Not on file  . Number of children: 2  . Years of education: 40  . Highest education level: High school graduate  Occupational History  . Occupation: Retired  Tobacco Use  . Smoking status: Former Research scientist (life sciences)  .  Smokeless tobacco: Never Used  Vaping Use  . Vaping Use: Never used  Substance and Sexual Activity  . Alcohol use: No  . Drug use: No  . Sexual activity: Never  Other Topics Concern  . Not on file  Social History Narrative   Lives alone    Left handed   Social Determinants of Health   Financial Resource Strain: Medium Risk  . Difficulty of Paying Living Expenses: Somewhat hard  Food Insecurity: No Food Insecurity  . Worried About Charity fundraiser in the Last Year: Never true  . Ran Out of Food in the Last Year: Never true  Transportation Needs: No Transportation Needs  . Lack of Transportation (Medical): No  . Lack of Transportation (Non-Medical): No  Physical Activity: Inactive  . Days of Exercise per Week: 0 days  . Minutes of Exercise per Session: 0 min  Stress: Stress Concern Present  . Feeling of Stress : To some extent  Social Connections: Moderately Isolated  . Frequency of Communication with Friends and Family: More than three times a week  . Frequency of Social Gatherings with Friends and Family: More than three times a week  . Attends Religious Services: More than 4 times per year  . Active Member of Clubs or Organizations: No  . Attends  Club or Organization Meetings: Never  . Marital Status: Divorced  Human resources officer Violence: Not At Risk  . Fear of Current or Ex-Partner: No  . Emotionally Abused: No  . Physically Abused: No  . Sexually Abused: No    Current Outpatient Medications on File Prior to Visit  Medication Sig Dispense Refill  . albuterol (PROVENTIL HFA;VENTOLIN HFA) 108 (90 Base) MCG/ACT inhaler Inhale 2 puffs into the lungs every 6 (six) hours as needed for wheezing or shortness of breath. 1 Inhaler 0  . Alcohol Swabs (ALCOHOL PREP) PADS Use for testing (Patient taking differently: 1 each by Other route 2 (two) times daily. E11.9) 100 each 1  . amLODipine-benazepril (LOTREL) 5-20 MG capsule Take 1 capsule by mouth daily. 90 capsule 0  .  amoxicillin (AMOXIL) 500 MG capsule Take 1 capsule (500 mg total) by mouth 2 (two) times daily. 10 capsule 0  . aspirin EC 81 MG tablet Take 81 mg by mouth daily.     . Biotin 10000 MCG TABS Take 1 tablet by mouth daily.     . Blood Glucose Monitoring Suppl (TRUE METRIX METER) w/Device KIT 1 each by Does not apply route 2 (two) times daily. Use to monitor glucose levels BID; E11.29 (Patient taking differently: 1 each by Does not apply route 2 (two) times daily. E11.29) 1 kit 0  . carvedilol (COREG) 6.25 MG tablet Take 1 tablet by mouth twice daily. 180 tablet 0  . Cholecalciferol 25 MCG (1000 UT) tablet Take 1 tablet by mouth daily. 90 tablet 0  . citalopram (CELEXA) 20 MG tablet Take 1 tablet (20 mg total) by mouth daily. (Patient taking differently: Take 30 mg by mouth daily.) 90 tablet 1  . fenofibrate (TRICOR) 145 MG tablet Take 1 tablet by mouth daily. 90 tablet 0  . fluticasone (FLONASE) 50 MCG/ACT nasal spray Place 2 sprays into both nostrils daily. 48 g 0  . folic acid (FOLVITE) 1 MG tablet Take 1 tablet (1 mg total) by mouth daily. 90 tablet 0  . mirtazapine (REMERON) 15 MG tablet Take 7.5 mg by mouth at bedtime.    Marland Kitchen omeprazole (PRILOSEC) 40 MG capsule TAKE 1 CAPSULE(40 MG) BY MOUTH DAILY (Patient taking differently: Take 40 mg by mouth daily.) 90 capsule 0  . simvastatin (ZOCOR) 20 MG tablet Take 1 tablet by mouth every night. 90 tablet 0  . traZODone (DESYREL) 50 MG tablet Take 50 mg by mouth at bedtime.     . TRUE METRIX BLOOD GLUCOSE TEST test strip TEST TWICE DAILY 150 strip 3  . VICTOZA 18 MG/3ML SOPN Inject 1.8 mg subcutaneously daily. 27 mL 0   No current facility-administered medications on file prior to visit.    No Known Allergies  Family History  Problem Relation Age of Onset  . Diabetes Mother     BP 132/62 (BP Location: Left Arm, Patient Position: Sitting, Cuff Size: Normal)   Pulse 68   Ht 5' 4" (1.626 m)   Wt 138 lb 9.6 oz (62.9 kg)   SpO2 99%   BMI 23.79  kg/m    Review of Systems She has lost a few more lbs.     Objective:   Physical Exam VITAL SIGNS:  See vs page GENERAL: no distress Pulses: dorsalis pedis intact bilat.   MSK: no deformity of the feet CV: no leg edema.   Skin:  no ulcer on the feet.  normal color and temp on the feet. Neuro: sensation is intact to touch on  the feet.   Ext: there is bilateral onychomycosis of the toenails.     Lab Results  Component Value Date   HGBA1C 8.2 (A) 03/02/2020       Assessment & Plan:  Insulin-requiring type 2 DM, with stage 4 CRI: uncontrolled.  Weight loss, poss due to Victoza, but uncertain etiology and prognosis  Patient Instructions  Please reduce the NPH insulin to 15 units each morning.  It is important to swirl this before injecting.  Please continue the same Victoza.    check your blood sugar twice a day.  vary the time of day when you check, between before the 3 meals, and at bedtime.  also check if you have symptoms of your blood sugar being too high or too low.  please keep a record of the readings and bring it to your next appointment here (or you can bring the meter itself).  You can write it on any piece of paper.  please call us sooner if your blood sugar goes below 70, or if you have a lot of readings over 200.   Please make an appointment with your primary care provider, about your weight loss.  Please come back for a follow-up appointment in 2 months.

## 2020-04-18 NOTE — Patient Instructions (Addendum)
Please reduce the NPH insulin to 15 units each morning.  It is important to swirl this before injecting.  Please continue the same Victoza.    check your blood sugar twice a day.  vary the time of day when you check, between before the 3 meals, and at bedtime.  also check if you have symptoms of your blood sugar being too high or too low.  please keep a record of the readings and bring it to your next appointment here (or you can bring the meter itself).  You can write it on any piece of paper.  please call us sooner if your blood sugar goes below 70, or if you have a lot of readings over 200.   Please make an appointment with your primary care provider, about your weight loss.  Please come back for a follow-up appointment in 2 months.

## 2020-04-24 ENCOUNTER — Telehealth: Payer: Self-pay | Admitting: Medical Oncology

## 2020-04-24 ENCOUNTER — Telehealth: Payer: Self-pay | Admitting: Physician Assistant

## 2020-04-24 NOTE — Telephone Encounter (Signed)
"  when is my next appt?" 02/11-Her last EPO  injection was 2/11. 01/26 Missed appt with [facility]   Schedule message sent for this week or next .

## 2020-04-24 NOTE — Telephone Encounter (Signed)
Scheduled appts per 3/28 sch msg. Pt aware.  

## 2020-04-27 NOTE — Progress Notes (Signed)
[place] OFFICE PROGRESS NOTE  [name], NP-C [place] [address]  DIAGNOSIS: Anemia of chronic disease secondary to chronic renal insufficiency  PRIOR THERAPY: None  CURRENT THERAPY: Retacrit 40,000 units subcutaneously every 2 weeks in addition to over-the-counter oral iron tablet 1 tablet every day.  INTERVAL HISTORY: Courtney Goodman 75 y.o. female returns to the clinic today for a follow up visit. The patient was last seen in the clinic in October 2021. She was supposed to follow up in January 2022 but missed her appointment due to being sick. Her last retacrit injection was 03/10/20. Today she is feeling fine. Regarding her fatigue, she actually feels like she has more energy and states she has been walking more. She reports some dyspnea on exertion and lightheadedness every once in awhile. She is unsure if she is still taking her iron supplements. Her last iron study from February 2022 is unremarkable. Denies chest pain. She denies any abnormal bleeding or bruising. She is here for evaluation and repeat lab work.   MEDICAL HISTORY: Past Medical History:  Diagnosis Date  . Anemia of chronic disease   . Asthma   . Atherosclerotic heart disease of native coronary artery without angina pectoris   . CKD (chronic kidney disease)   . COPD (chronic obstructive pulmonary disease) ([place])   . DM (diabetes mellitus), type 2 with renal complications ([place])   . Dysrhythmia   . Elevated ferritin 01/03/2017  . GAD (generalized anxiety disorder)   . Hypertension   . Mixed hyperlipidemia due to type 2 diabetes mellitus ([place])   . Mixed incontinence    per medical records from [place]  . Osteopenia   . Personal history of noncompliance with medical treatment, presenting hazards to health   . Shortness of breath dyspnea   . Suicidal ideation   . TB (tuberculosis), treated    age 8  . Vitamin D deficiency     ALLERGIES:  has No Known  Allergies.  MEDICATIONS:  Current Outpatient Medications  Medication Sig Dispense Refill  . albuterol (PROVENTIL HFA;VENTOLIN HFA) 108 (90 Base) MCG/ACT inhaler Inhale 2 puffs into the lungs every 6 (six) hours as needed for wheezing or shortness of breath. 1 Inhaler 0  . Alcohol Swabs (ALCOHOL PREP) PADS Use for testing (Patient taking differently: 1 each by Other route 2 (two) times daily. E11.9) 100 each 1  . amLODipine (NORVASC) 5 MG tablet     . amoxicillin (AMOXIL) 500 MG capsule Take 1 capsule (500 mg total) by mouth 2 (two) times daily. 10 capsule 0  . aspirin EC 81 MG tablet Take 81 mg by mouth daily.     . Biotin 10000 MCG TABS Take 1 tablet by mouth daily.     . Blood Glucose Monitoring Suppl (TRUE METRIX METER) w/Device KIT 1 each by Does not apply route 2 (two) times daily. Use to monitor glucose levels BID; E11.29 (Patient taking differently: 1 each by Does not apply route 2 (two) times daily. E11.29) 1 kit 0  . carvedilol (COREG) 6.25 MG tablet Take 1 tablet by mouth twice daily. 180 tablet 0  . Cholecalciferol 25 MCG (1000 UT) tablet Take 1 tablet by mouth daily. 90 tablet 0  . fenofibrate (TRICOR) 145 MG tablet Take 1 tablet by mouth daily. 90 tablet 0  . fluticasone (FLONASE) 50 MCG/ACT nasal spray Place 2 sprays into both nostrils daily. 48 g 0  . folic acid (FOLVITE) 1 MG tablet Take 1  tablet (1 mg total) by mouth daily. 90 tablet 0  . Insulin NPH, Human,, Isophane, (NOVOLIN N FLEXPEN) 100 UNIT/ML Kiwkpen Inject 15 Units into the skin every morning. And pen needles 1/day 15 mL 11  . omeprazole (PRILOSEC) 40 MG capsule TAKE 1 CAPSULE(40 MG) BY MOUTH DAILY (Patient taking differently: Take 40 mg by mouth daily.) 90 capsule 0  . simvastatin (ZOCOR) 20 MG tablet Take 1 tablet by mouth every night. 90 tablet 0  . TRUE METRIX BLOOD GLUCOSE TEST test strip TEST TWICE DAILY 150 strip 3  . VICTOZA 18 MG/3ML SOPN Inject 1.8 mg subcutaneously daily. 27 mL 0  . citalopram (CELEXA) 20 MG  tablet Take 1 tablet (20 mg total) by mouth daily. (Patient taking differently: Take 30 mg by mouth daily.) 90 tablet 1  . mirtazapine (REMERON) 7.5 MG tablet     . traZODone (DESYREL) 50 MG tablet Take 50 mg by mouth at bedtime.      No current facility-administered medications for this visit.   Facility-Administered Medications Ordered in Other Visits  Medication Dose Route Frequency Provider Last Rate Last Admin  . epoetin alfa-epbx (RETACRIT) injection 40,000 Units  40,000 Units Subcutaneous Once Curt Bears, MD        SURGICAL HISTORY:  Past Surgical History:  Procedure Laterality Date  . ANKLE RECONSTRUCTION     right  . CARPAL TUNNEL RELEASE    . CORONARY ANGIOPLASTY      REVIEW OF SYSTEMS:   Review of Systems  Constitutional: Negative for appetite change, chills, fatigue, fever and unexpected weight change.  HENT: Negative for mouth sores, nosebleeds, sore throat and trouble swallowing.   Eyes: Negative for eye problems and icterus.  Respiratory: Positive for occasional dyspnea on exertion. Negative for cough, hemoptysis, and wheezing.   Cardiovascular: Negative for chest pain and leg swelling.  Gastrointestinal: Negative for abdominal pain, constipation, diarrhea, nausea and vomiting.  Genitourinary: Negative for bladder incontinence, difficulty urinating, dysuria, frequency and hematuria.   Musculoskeletal: Negative for back pain, gait problem, neck pain and neck stiffness.  Skin: Negative for itching and rash.  Neurological: Positive for occasional lightheadedness. Negative for dizziness, extremity weakness, gait problem, headaches, and seizures.  Hematological: Negative for adenopathy. Does not bruise/bleed easily.  Psychiatric/Behavioral: Negative for confusion, depression and sleep disturbance. The patient is not nervous/anxious.     PHYSICAL EXAMINATION:  Blood pressure (!) 107/55, pulse 67, temperature 97.9 F (36.6 C), temperature source Tympanic, resp.  rate 17, weight 139 lb 11.2 oz (63.4 kg), SpO2 100 %.  ECOG PERFORMANCE STATUS: 1 - Symptomatic but completely ambulatory  Physical Exam  Constitutional: Oriented to person, place, and time and well-developed, well-nourished, and in no distress. HENT:  Head: Normocephalic and atraumatic.  Mouth/Throat: Oropharynx is clear and moist. No oropharyngeal exudate.  Eyes: Conjunctivae are normal. Right eye exhibits no discharge. Left eye exhibits no discharge. No scleral icterus.  Neck: Normal range of motion. Neck supple.  Cardiovascular: Normal rate, regular rhythm, normal heart sounds and intact distal pulses.   Pulmonary/Chest: Effort normal and breath sounds normal. No respiratory distress. No wheezes. No rales.  Abdominal: Soft. Bowel sounds are normal. Exhibits no distension and no mass. There is no tenderness.  Musculoskeletal: Normal range of motion. Exhibits no edema.  Lymphadenopathy:    No cervical adenopathy.  Neurological: Alert and oriented to person, place, and time. Exhibits normal muscle tone. Ambulates with a cane.  Skin: Skin is warm and dry. No rash noted. Not diaphoretic. No erythema. No  pallor.  Psychiatric: Mood, memory and judgment normal.  Vitals reviewed.  LABORATORY DATA: Lab Results  Component Value Date   WBC 7.6 05/01/2020   HGB 8.4 (L) 05/01/2020   HCT 25.8 (L) 05/01/2020   MCV 94.2 05/01/2020   PLT 218 05/01/2020      Chemistry      Component Value Date/Time   NA 128 (L) 02/01/2020 0512   NA 146 (H) 05/07/2017 1147   NA 137 05/29/2011 1946   K 4.0 02/01/2020 0512   K 3.9 05/29/2011 1946   CL 93 (L) 02/01/2020 0512   CL 100 05/29/2011 1946   CO2 25 02/01/2020 0512   CO2 28 05/29/2011 1946   BUN 44 (H) 02/01/2020 0512   BUN 28 (H) 05/07/2017 1147   BUN 24 (H) 05/29/2011 1946   CREATININE 2.73 (H) 02/01/2020 0512   CREATININE 1.52 (H) 04/08/2018 1154   CREATININE 1.56 (H) 01/01/2017 0941      Component Value Date/Time   CALCIUM 9.9 02/01/2020  0512   CALCIUM 9.8 05/29/2011 1946   ALKPHOS 37 (L) 10/12/2019 0511   ALKPHOS 86 05/29/2011 1946   AST 19 10/12/2019 0511   AST 14 (L) 04/08/2018 1154   ALT 11 10/12/2019 0511   ALT 13 04/08/2018 1154   ALT 15 05/29/2011 1946   BILITOT 0.5 10/12/2019 0511   BILITOT 0.4 04/08/2018 1154       RADIOGRAPHIC STUDIES:  No results found.   ASSESSMENT/PLAN:  This is a very pleasant 75 year old African-American female with anemia of chronic disease secondary to chronic renal insufficiency.   The patient is currently on treatment with Retacrit 40,000 units every 2 weeks and has been tolerating this treatment well.  She is also on oral iron tablets 1-2 tablets every day.  The patient had a repeat CBC, iron study, and ferritin in Feb 2022 which is stable. Her labs show a down trending Hbg of 8.4 since she has not received her retacrit injection. We will resume her injections today and will continue every 2 weeks.   We will see her back for a follow up in 3 months for evaluation and a repeat CBC, iron study, and ferritin.    The patient was advised to call immediately if she has any concerning symptoms in the interval. The patient voices understanding of current disease status and treatment options and is in agreement with the current care plan. All questions were answered. The patient knows to call the clinic with any problems, questions or concerns. We can certainly see the patient much sooner if necessary     No orders of the defined types were placed in this encounter.    I spent 20-29 minutes in this encounter   Verta Riedlinger L Jessyka Austria, PA-C 05/01/20

## 2020-05-01 ENCOUNTER — Other Ambulatory Visit: Payer: Self-pay

## 2020-05-01 ENCOUNTER — Telehealth: Payer: Self-pay | Admitting: Internal Medicine

## 2020-05-01 ENCOUNTER — Inpatient Hospital Stay (HOSPITAL_BASED_OUTPATIENT_CLINIC_OR_DEPARTMENT_OTHER): Payer: Medicare HMO | Admitting: Physician Assistant

## 2020-05-01 ENCOUNTER — Inpatient Hospital Stay: Payer: Medicare HMO | Attending: Oncology

## 2020-05-01 ENCOUNTER — Inpatient Hospital Stay: Payer: Medicare HMO

## 2020-05-01 VITALS — BP 122/68 | HR 68 | Temp 98.1°F | Resp 18

## 2020-05-01 VITALS — BP 107/55 | HR 67 | Temp 97.9°F | Resp 17 | Wt 139.7 lb

## 2020-05-01 DIAGNOSIS — J449 Chronic obstructive pulmonary disease, unspecified: Secondary | ICD-10-CM | POA: Insufficient documentation

## 2020-05-01 DIAGNOSIS — N189 Chronic kidney disease, unspecified: Secondary | ICD-10-CM

## 2020-05-01 DIAGNOSIS — Z79899 Other long term (current) drug therapy: Secondary | ICD-10-CM | POA: Insufficient documentation

## 2020-05-01 DIAGNOSIS — E785 Hyperlipidemia, unspecified: Secondary | ICD-10-CM | POA: Insufficient documentation

## 2020-05-01 DIAGNOSIS — I2510 Atherosclerotic heart disease of native coronary artery without angina pectoris: Secondary | ICD-10-CM | POA: Diagnosis not present

## 2020-05-01 DIAGNOSIS — N183 Chronic kidney disease, stage 3 unspecified: Secondary | ICD-10-CM

## 2020-05-01 DIAGNOSIS — D638 Anemia in other chronic diseases classified elsewhere: Secondary | ICD-10-CM | POA: Diagnosis not present

## 2020-05-01 DIAGNOSIS — I129 Hypertensive chronic kidney disease with stage 1 through stage 4 chronic kidney disease, or unspecified chronic kidney disease: Secondary | ICD-10-CM | POA: Insufficient documentation

## 2020-05-01 DIAGNOSIS — D631 Anemia in chronic kidney disease: Secondary | ICD-10-CM | POA: Insufficient documentation

## 2020-05-01 DIAGNOSIS — E559 Vitamin D deficiency, unspecified: Secondary | ICD-10-CM | POA: Diagnosis not present

## 2020-05-01 DIAGNOSIS — M8580 Other specified disorders of bone density and structure, unspecified site: Secondary | ICD-10-CM | POA: Diagnosis not present

## 2020-05-01 DIAGNOSIS — E119 Type 2 diabetes mellitus without complications: Secondary | ICD-10-CM | POA: Diagnosis not present

## 2020-05-01 LAB — CBC WITH DIFFERENTIAL (CANCER CENTER ONLY)
Abs Immature Granulocytes: 0.09 10*3/uL — ABNORMAL HIGH (ref 0.00–0.07)
Basophils Absolute: 0 10*3/uL (ref 0.0–0.1)
Basophils Relative: 1 %
Eosinophils Absolute: 0.1 10*3/uL (ref 0.0–0.5)
Eosinophils Relative: 2 %
HCT: 25.8 % — ABNORMAL LOW (ref 36.0–46.0)
Hemoglobin: 8.4 g/dL — ABNORMAL LOW (ref 12.0–15.0)
Immature Granulocytes: 1 %
Lymphocytes Relative: 25 %
Lymphs Abs: 1.9 10*3/uL (ref 0.7–4.0)
MCH: 30.7 pg (ref 26.0–34.0)
MCHC: 32.6 g/dL (ref 30.0–36.0)
MCV: 94.2 fL (ref 80.0–100.0)
Monocytes Absolute: 0.5 10*3/uL (ref 0.1–1.0)
Monocytes Relative: 7 %
Neutro Abs: 4.9 10*3/uL (ref 1.7–7.7)
Neutrophils Relative %: 64 %
Platelet Count: 218 10*3/uL (ref 150–400)
RBC: 2.74 MIL/uL — ABNORMAL LOW (ref 3.87–5.11)
RDW: 17 % — ABNORMAL HIGH (ref 11.5–15.5)
WBC Count: 7.6 10*3/uL (ref 4.0–10.5)
nRBC: 0 % (ref 0.0–0.2)

## 2020-05-01 MED ORDER — EPOETIN ALFA-EPBX 40000 UNIT/ML IJ SOLN
40000.0000 [IU] | Freq: Once | INTRAMUSCULAR | Status: AC
  Administered 2020-05-01: 40000 [IU] via SUBCUTANEOUS

## 2020-05-01 MED ORDER — EPOETIN ALFA-EPBX 40000 UNIT/ML IJ SOLN
INTRAMUSCULAR | Status: AC
  Filled 2020-05-01: qty 1

## 2020-05-01 NOTE — Patient Instructions (Signed)

## 2020-05-01 NOTE — Telephone Encounter (Signed)
Scheduled per los. Gave avs and calendar  

## 2020-05-09 DIAGNOSIS — N184 Chronic kidney disease, stage 4 (severe): Secondary | ICD-10-CM | POA: Diagnosis not present

## 2020-05-09 DIAGNOSIS — R634 Abnormal weight loss: Secondary | ICD-10-CM | POA: Diagnosis not present

## 2020-05-09 DIAGNOSIS — D649 Anemia, unspecified: Secondary | ICD-10-CM | POA: Diagnosis not present

## 2020-05-09 DIAGNOSIS — I129 Hypertensive chronic kidney disease with stage 1 through stage 4 chronic kidney disease, or unspecified chronic kidney disease: Secondary | ICD-10-CM | POA: Diagnosis not present

## 2020-05-09 DIAGNOSIS — N183 Chronic kidney disease, stage 3 unspecified: Secondary | ICD-10-CM | POA: Diagnosis not present

## 2020-05-09 DIAGNOSIS — E1122 Type 2 diabetes mellitus with diabetic chronic kidney disease: Secondary | ICD-10-CM | POA: Diagnosis not present

## 2020-05-09 DIAGNOSIS — R413 Other amnesia: Secondary | ICD-10-CM | POA: Diagnosis not present

## 2020-05-15 ENCOUNTER — Inpatient Hospital Stay: Payer: Medicare HMO

## 2020-05-15 ENCOUNTER — Other Ambulatory Visit: Payer: Self-pay

## 2020-05-15 VITALS — BP 140/82 | HR 75 | Temp 97.3°F | Resp 18

## 2020-05-15 DIAGNOSIS — D631 Anemia in chronic kidney disease: Secondary | ICD-10-CM | POA: Diagnosis not present

## 2020-05-15 DIAGNOSIS — D638 Anemia in other chronic diseases classified elsewhere: Secondary | ICD-10-CM

## 2020-05-15 DIAGNOSIS — J449 Chronic obstructive pulmonary disease, unspecified: Secondary | ICD-10-CM | POA: Diagnosis not present

## 2020-05-15 DIAGNOSIS — E119 Type 2 diabetes mellitus without complications: Secondary | ICD-10-CM | POA: Diagnosis not present

## 2020-05-15 DIAGNOSIS — M8580 Other specified disorders of bone density and structure, unspecified site: Secondary | ICD-10-CM | POA: Diagnosis not present

## 2020-05-15 DIAGNOSIS — Z79899 Other long term (current) drug therapy: Secondary | ICD-10-CM | POA: Diagnosis not present

## 2020-05-15 DIAGNOSIS — E785 Hyperlipidemia, unspecified: Secondary | ICD-10-CM | POA: Diagnosis not present

## 2020-05-15 DIAGNOSIS — I2510 Atherosclerotic heart disease of native coronary artery without angina pectoris: Secondary | ICD-10-CM | POA: Diagnosis not present

## 2020-05-15 DIAGNOSIS — I129 Hypertensive chronic kidney disease with stage 1 through stage 4 chronic kidney disease, or unspecified chronic kidney disease: Secondary | ICD-10-CM | POA: Diagnosis not present

## 2020-05-15 DIAGNOSIS — N189 Chronic kidney disease, unspecified: Secondary | ICD-10-CM | POA: Diagnosis not present

## 2020-05-15 DIAGNOSIS — N183 Chronic kidney disease, stage 3 unspecified: Secondary | ICD-10-CM

## 2020-05-15 LAB — CBC WITH DIFFERENTIAL (CANCER CENTER ONLY)
Abs Immature Granulocytes: 0.07 10*3/uL (ref 0.00–0.07)
Basophils Absolute: 0.1 10*3/uL (ref 0.0–0.1)
Basophils Relative: 1 %
Eosinophils Absolute: 0.1 10*3/uL (ref 0.0–0.5)
Eosinophils Relative: 2 %
HCT: 32.2 % — ABNORMAL LOW (ref 36.0–46.0)
Hemoglobin: 10.2 g/dL — ABNORMAL LOW (ref 12.0–15.0)
Immature Granulocytes: 1 %
Lymphocytes Relative: 20 %
Lymphs Abs: 1.3 10*3/uL (ref 0.7–4.0)
MCH: 30.7 pg (ref 26.0–34.0)
MCHC: 31.7 g/dL (ref 30.0–36.0)
MCV: 97 fL (ref 80.0–100.0)
Monocytes Absolute: 0.4 10*3/uL (ref 0.1–1.0)
Monocytes Relative: 7 %
Neutro Abs: 4.6 10*3/uL (ref 1.7–7.7)
Neutrophils Relative %: 69 %
Platelet Count: 306 10*3/uL (ref 150–400)
RBC: 3.32 MIL/uL — ABNORMAL LOW (ref 3.87–5.11)
RDW: 17.7 % — ABNORMAL HIGH (ref 11.5–15.5)
WBC Count: 6.5 10*3/uL (ref 4.0–10.5)
nRBC: 0 % (ref 0.0–0.2)

## 2020-05-15 MED ORDER — FULVESTRANT 250 MG/5ML IM SOLN
INTRAMUSCULAR | Status: AC
  Filled 2020-05-15: qty 10

## 2020-05-15 MED ORDER — EPOETIN ALFA-EPBX 40000 UNIT/ML IJ SOLN
INTRAMUSCULAR | Status: AC
  Filled 2020-05-15: qty 1

## 2020-05-15 MED ORDER — EPOETIN ALFA-EPBX 40000 UNIT/ML IJ SOLN
40000.0000 [IU] | Freq: Once | INTRAMUSCULAR | Status: AC
  Administered 2020-05-15: 40000 [IU] via SUBCUTANEOUS

## 2020-05-15 NOTE — Patient Instructions (Signed)

## 2020-05-24 ENCOUNTER — Encounter: Payer: Self-pay | Admitting: Internal Medicine

## 2020-05-25 ENCOUNTER — Telehealth: Payer: Self-pay | Admitting: Internal Medicine

## 2020-05-25 NOTE — Telephone Encounter (Signed)
R/s appt per 4/28 sch msg. Pt aware.

## 2020-05-29 ENCOUNTER — Inpatient Hospital Stay: Payer: Medicare HMO

## 2020-06-01 ENCOUNTER — Other Ambulatory Visit: Payer: Self-pay

## 2020-06-01 NOTE — Patient Outreach (Signed)
[facility]) Care Management  [place]  06/01/2020   Shayleigh Bouldin Wiesman 09-Sep-1945 [id number]  Subjective: Telephone call to daughter [name].  She reports patient doing good.  Patient has [employer] worker daily for 2.5 hours.  Discussed diabetes management. She verbalized understanding.    Objective:   Encounter Medications:  Outpatient Encounter Medications as of 06/01/2020  Medication Sig  . albuterol (PROVENTIL HFA;VENTOLIN HFA) 108 (90 Base) MCG/ACT inhaler Inhale 2 puffs into the lungs every 6 (six) hours as needed for wheezing or shortness of breath.  . Alcohol Swabs (ALCOHOL PREP) PADS Use for testing (Patient taking differently: 1 each by Other route 2 (two) times daily. E11.9)  . amLODipine (NORVASC) 5 MG tablet   . amoxicillin (AMOXIL) 500 MG capsule Take 1 capsule (500 mg total) by mouth 2 (two) times daily.  Marland Kitchen aspirin EC 81 MG tablet Take 81 mg by mouth daily.   . Biotin 10000 MCG TABS Take 1 tablet by mouth daily.   . Blood Glucose Monitoring Suppl (TRUE METRIX METER) w/Device KIT 1 each by Does not apply route 2 (two) times daily. Use to monitor glucose levels BID; E11.29 (Patient taking differently: 1 each by Does not apply route 2 (two) times daily. E11.29)  . carvedilol (COREG) 6.25 MG tablet Take 1 tablet by mouth twice daily.  . Cholecalciferol 25 MCG (1000 UT) tablet Take 1 tablet by mouth daily.  . citalopram (CELEXA) 20 MG tablet Take 1 tablet (20 mg total) by mouth daily. (Patient taking differently: Take 30 mg by mouth daily.)  . fenofibrate (TRICOR) 145 MG tablet Take 1 tablet by mouth daily.  . fluticasone (FLONASE) 50 MCG/ACT nasal spray Place 2 sprays into both nostrils daily.  . folic acid (FOLVITE) 1 MG tablet Take 1 tablet (1 mg total) by mouth daily.  . Insulin NPH, Human,, Isophane, (NOVOLIN N FLEXPEN) 100 UNIT/ML Kiwkpen Inject 15 Units into the skin every morning. And pen needles 1/day  . mirtazapine (REMERON) 7.5 MG tablet   . omeprazole  (PRILOSEC) 40 MG capsule TAKE 1 CAPSULE(40 MG) BY MOUTH DAILY (Patient taking differently: Take 40 mg by mouth daily.)  . simvastatin (ZOCOR) 20 MG tablet Take 1 tablet by mouth every night.  . traZODone (DESYREL) 50 MG tablet Take 50 mg by mouth at bedtime.   . TRUE METRIX BLOOD GLUCOSE TEST test strip TEST TWICE DAILY  . VICTOZA 18 MG/3ML SOPN Inject 1.8 mg subcutaneously daily.   No facility-administered encounter medications on file as of 06/01/2020.    Functional Status:  In your present state of health, do you have any difficulty performing the following activities: 06/01/2020 10/07/2019  Hearing? Y Y  Comment - -  Vision? N N  Difficulty concentrating or making decisions? N N  Walking or climbing stairs? N N  Dressing or bathing? Y Y  Comment tiredness tiredness  Doing errands, shopping? [name]  Comment daughter handles daughter Engineer, building services and eating ? N N  Using the Toilet? N N  In the past six months, have you accidently leaked urine? N N  Do you have problems with loss of bowel control? N N  Managing your Medications? [name]  Comment family helps patient forgetful  Managing your Finances? [name]  Comment family helps family assists  Housekeeping or managing your Housekeeping? [name]  Comment family assists family assists  Some recent data might be hidden    Fall/Depression Screening: Fall Risk  03/02/2020 10/28/2019 10/07/2019  Falls in the past year? 1 1 1   Number falls in past yr: 1 1 1   Injury with Fall? 1 1 1   Risk Factor Category  - - -  Risk for fall due to : History of fall(s) - -  Follow up Falls prevention discussed - -   PHQ 2/9 Scores 06/01/2020 12/30/2019 10/05/2019 10/05/2019 09/29/2019 07/08/2019 10/12/2018  PHQ - 2 Score - - 0 0 0 0 6  PHQ- 9 Score - - - - - - 24  Exception Documentation Other- indicate reason in comment box Other- indicate reason in comment box - - - - -  Not completed daughter [name] daughter [name] completed assessment - - - - -     Assessment:  Goals Addressed            This Visit's Progress   . [facility]N-Monitor and Manage My Blood Sugar   On track    Timeframe:  Long-Range Goal Priority:  High Start Date:   03/02/20                          Expected End Date:     01/27/21               Follow Up Date 09/27/20   - check blood sugar at prescribed times - check blood sugar if I feel it is too high or too low - take the blood sugar log to all doctor visits    Why is this important?   Checking your blood sugar at home helps to keep it from getting very high or very low.  Writing the results in a diary or log helps the doctor know how to care for you.  Your blood sugar log should have the time, date and the results.  Also, write down the amount of insulin or other medicine that you take.  Other information, like what you ate, exercise done and how you were feeling, will also be helpful.     Notes: Patient keeps log of blood sugars.  A1c 7.9. 03/02/20 Patient sugars ranging from around 95- to the 200's.  Patient sometimes does not follow diet.   06/01/20 Keep up the great work!! Monitor for signs of hypoglycemia.         Plan: RN CM will contact in August. Follow-up: Caregiver/ Patient agrees to Care Plan and Follow-up.   Jone Baseman, RN, MSN [facility] [facility] Management Care Management Coordinator Direct Line [phone] Cell [phone] Toll Free: [phone]  Fax: [phone]

## 2020-06-02 ENCOUNTER — Inpatient Hospital Stay: Payer: Medicare HMO

## 2020-06-02 ENCOUNTER — Other Ambulatory Visit: Payer: Self-pay

## 2020-06-02 ENCOUNTER — Inpatient Hospital Stay: Payer: Medicare HMO | Attending: Oncology

## 2020-06-02 DIAGNOSIS — D631 Anemia in chronic kidney disease: Secondary | ICD-10-CM | POA: Insufficient documentation

## 2020-06-02 DIAGNOSIS — I129 Hypertensive chronic kidney disease with stage 1 through stage 4 chronic kidney disease, or unspecified chronic kidney disease: Secondary | ICD-10-CM | POA: Diagnosis not present

## 2020-06-02 DIAGNOSIS — N189 Chronic kidney disease, unspecified: Secondary | ICD-10-CM | POA: Diagnosis not present

## 2020-06-02 DIAGNOSIS — D638 Anemia in other chronic diseases classified elsewhere: Secondary | ICD-10-CM

## 2020-06-02 LAB — CBC WITH DIFFERENTIAL (CANCER CENTER ONLY)
Abs Immature Granulocytes: 0.02 10*3/uL (ref 0.00–0.07)
Basophils Absolute: 0 10*3/uL (ref 0.0–0.1)
Basophils Relative: 1 %
Eosinophils Absolute: 0.1 10*3/uL (ref 0.0–0.5)
Eosinophils Relative: 2 %
HCT: 35.3 % — ABNORMAL LOW (ref 36.0–46.0)
Hemoglobin: 11.7 g/dL — ABNORMAL LOW (ref 12.0–15.0)
Immature Granulocytes: 0 %
Lymphocytes Relative: 25 %
Lymphs Abs: 1.6 10*3/uL (ref 0.7–4.0)
MCH: 30.7 pg (ref 26.0–34.0)
MCHC: 33.1 g/dL (ref 30.0–36.0)
MCV: 92.7 fL (ref 80.0–100.0)
Monocytes Absolute: 0.3 10*3/uL (ref 0.1–1.0)
Monocytes Relative: 5 %
Neutro Abs: 4.2 10*3/uL (ref 1.7–7.7)
Neutrophils Relative %: 67 %
Platelet Count: 223 10*3/uL (ref 150–400)
RBC: 3.81 MIL/uL — ABNORMAL LOW (ref 3.87–5.11)
RDW: 15.9 % — ABNORMAL HIGH (ref 11.5–15.5)
WBC Count: 6.3 10*3/uL (ref 4.0–10.5)
nRBC: 0 % (ref 0.0–0.2)

## 2020-06-02 NOTE — Progress Notes (Signed)
Pt's Hg was 11.7, so she does not meet parameters to receive retacrit today.

## 2020-06-05 ENCOUNTER — Encounter (HOSPITAL_COMMUNITY): Payer: Self-pay

## 2020-06-05 ENCOUNTER — Emergency Department (HOSPITAL_COMMUNITY)
Admission: EM | Admit: 2020-06-05 | Discharge: 2020-06-05 | Disposition: A | Discharge status: Home / Self Care | Payer: Medicare HMO | Attending: Emergency Medicine | Admitting: Emergency Medicine

## 2020-06-05 ENCOUNTER — Other Ambulatory Visit: Payer: Self-pay

## 2020-06-05 ENCOUNTER — Emergency Department (HOSPITAL_COMMUNITY): Payer: Medicare HMO

## 2020-06-05 DIAGNOSIS — E1169 Type 2 diabetes mellitus with other specified complication: Secondary | ICD-10-CM | POA: Diagnosis not present

## 2020-06-05 DIAGNOSIS — Z794 Long term (current) use of insulin: Secondary | ICD-10-CM | POA: Diagnosis not present

## 2020-06-05 DIAGNOSIS — I10 Essential (primary) hypertension: Secondary | ICD-10-CM | POA: Diagnosis not present

## 2020-06-05 DIAGNOSIS — R531 Weakness: Secondary | ICD-10-CM | POA: Diagnosis not present

## 2020-06-05 DIAGNOSIS — E1165 Type 2 diabetes mellitus with hyperglycemia: Secondary | ICD-10-CM | POA: Diagnosis not present

## 2020-06-05 DIAGNOSIS — Z7982 Long term (current) use of aspirin: Secondary | ICD-10-CM | POA: Diagnosis not present

## 2020-06-05 DIAGNOSIS — I129 Hypertensive chronic kidney disease with stage 1 through stage 4 chronic kidney disease, or unspecified chronic kidney disease: Secondary | ICD-10-CM | POA: Diagnosis not present

## 2020-06-05 DIAGNOSIS — N183 Chronic kidney disease, stage 3 unspecified: Secondary | ICD-10-CM | POA: Diagnosis not present

## 2020-06-05 DIAGNOSIS — E1122 Type 2 diabetes mellitus with diabetic chronic kidney disease: Secondary | ICD-10-CM | POA: Insufficient documentation

## 2020-06-05 DIAGNOSIS — Z79899 Other long term (current) drug therapy: Secondary | ICD-10-CM | POA: Insufficient documentation

## 2020-06-05 DIAGNOSIS — R4182 Altered mental status, unspecified: Secondary | ICD-10-CM | POA: Diagnosis not present

## 2020-06-05 DIAGNOSIS — J449 Chronic obstructive pulmonary disease, unspecified: Secondary | ICD-10-CM | POA: Insufficient documentation

## 2020-06-05 DIAGNOSIS — R42 Dizziness and giddiness: Secondary | ICD-10-CM | POA: Diagnosis not present

## 2020-06-05 DIAGNOSIS — E782 Mixed hyperlipidemia: Secondary | ICD-10-CM | POA: Diagnosis not present

## 2020-06-05 DIAGNOSIS — I2510 Atherosclerotic heart disease of native coronary artery without angina pectoris: Secondary | ICD-10-CM | POA: Insufficient documentation

## 2020-06-05 DIAGNOSIS — Z20822 Contact with and (suspected) exposure to covid-19: Secondary | ICD-10-CM | POA: Diagnosis not present

## 2020-06-05 DIAGNOSIS — Z955 Presence of coronary angioplasty implant and graft: Secondary | ICD-10-CM | POA: Insufficient documentation

## 2020-06-05 DIAGNOSIS — R410 Disorientation, unspecified: Secondary | ICD-10-CM | POA: Diagnosis not present

## 2020-06-05 LAB — RESP PANEL BY RT-PCR (FLU A&B, COVID) ARPGX2
Influenza A by PCR: NEGATIVE
Influenza B by PCR: NEGATIVE
SARS Coronavirus 2 by RT PCR: NEGATIVE

## 2020-06-05 LAB — CBC WITH DIFFERENTIAL/PLATELET
Abs Immature Granulocytes: 0.01 10*3/uL (ref 0.00–0.07)
Basophils Absolute: 0 10*3/uL (ref 0.0–0.1)
Basophils Relative: 1 %
Eosinophils Absolute: 0.1 10*3/uL (ref 0.0–0.5)
Eosinophils Relative: 2 %
HCT: 36.3 % (ref 36.0–46.0)
Hemoglobin: 11.6 g/dL — ABNORMAL LOW (ref 12.0–15.0)
Immature Granulocytes: 0 %
Lymphocytes Relative: 28 %
Lymphs Abs: 1.5 10*3/uL (ref 0.7–4.0)
MCH: 30.5 pg (ref 26.0–34.0)
MCHC: 32 g/dL (ref 30.0–36.0)
MCV: 95.5 fL (ref 80.0–100.0)
Monocytes Absolute: 0.4 10*3/uL (ref 0.1–1.0)
Monocytes Relative: 6 %
Neutro Abs: 3.5 10*3/uL (ref 1.7–7.7)
Neutrophils Relative %: 63 %
Platelets: 301 10*3/uL (ref 150–400)
RBC: 3.8 MIL/uL — ABNORMAL LOW (ref 3.87–5.11)
RDW: 15.9 % — ABNORMAL HIGH (ref 11.5–15.5)
WBC: 5.5 10*3/uL (ref 4.0–10.5)
nRBC: 0 % (ref 0.0–0.2)

## 2020-06-05 LAB — COMPREHENSIVE METABOLIC PANEL
ALT: 10 U/L (ref 0–44)
AST: 55 U/L — ABNORMAL HIGH (ref 15–41)
Albumin: 3.6 g/dL (ref 3.5–5.0)
Alkaline Phosphatase: 40 U/L (ref 38–126)
Anion gap: 8 (ref 5–15)
BUN: 38 mg/dL — ABNORMAL HIGH (ref 8–23)
CO2: 24 mmol/L (ref 22–32)
Calcium: 9.6 mg/dL (ref 8.9–10.3)
Chloride: 103 mmol/L (ref 98–111)
Creatinine, Ser: 2.22 mg/dL — ABNORMAL HIGH (ref 0.44–1.00)
GFR, Estimated: 23 mL/min — ABNORMAL LOW (ref >60)
Glucose, Bld: 279 mg/dL — ABNORMAL HIGH (ref 70–99)
Potassium: 5.5 mmol/L — ABNORMAL HIGH (ref 3.5–5.1)
Sodium: 135 mmol/L (ref 135–145)
Total Bilirubin: 1.2 mg/dL (ref 0.3–1.2)
Total Protein: 6.9 g/dL (ref 6.5–8.1)

## 2020-06-05 LAB — URINALYSIS, ROUTINE W REFLEX MICROSCOPIC
Bilirubin Urine: NEGATIVE
Glucose, UA: >=500 mg/dL — AB
Hgb urine dipstick: NEGATIVE
Ketones, ur: NEGATIVE mg/dL
Nitrite: NEGATIVE
Protein, ur: NEGATIVE mg/dL
Specific Gravity, Urine: 1.008 (ref 1.005–1.030)
pH: 6 (ref 5.0–8.0)

## 2020-06-05 MED ORDER — AMLODIPINE BESYLATE 10 MG PO TABS: 10.0000 mg | ORAL_TABLET | Freq: Every day | ORAL | 0 refills | Status: DC

## 2020-06-05 MED ORDER — CARVEDILOL 3.125 MG PO TABS
6.2500 mg | ORAL_TABLET | ORAL | Status: AC
  Administered 2020-06-05: 6.25 mg via ORAL
  Filled 2020-06-05: qty 2

## 2020-06-05 MED ORDER — AMLODIPINE BESYLATE 5 MG PO TABS
10.0000 mg | ORAL_TABLET | Freq: Once | ORAL | Status: AC
  Administered 2020-06-05: 10 mg via ORAL
  Filled 2020-06-05: qty 2

## 2020-06-05 MED ORDER — SODIUM CHLORIDE 0.9 % IV BOLUS
500.0000 mL | Freq: Once | INTRAVENOUS | Status: AC
  Administered 2020-06-05: 500 mL via INTRAVENOUS

## 2020-06-05 MED ORDER — SODIUM CHLORIDE 0.9 % IV SOLN: INTRAVENOUS | Status: DC

## 2020-06-05 NOTE — ED Notes (Signed)
Call daughter [name] [phone] when patient discharged.

## 2020-06-05 NOTE — ED Notes (Signed)
Patient transported to CT 

## 2020-06-05 NOTE — Discharge Instructions (Addendum)
Your testing today has not shown any specific abnormalities.  I would like for you to follow-up with your family doctor, you will need to be seen within 1 week for a blood pressure check.  I have increased the dose of your amlodipine to 10 mg/day, I have given you a new prescription, Please return to the ER for severe or worsening symptoms

## 2020-06-05 NOTE — ED Notes (Signed)
MD notified of BP

## 2020-06-05 NOTE — ED Provider Notes (Addendum)
[facility] EMERGENCY DEPARTMENT Provider Note   CSN: [id number] Arrival date & time: 06/05/20  1332     History Chief Complaint  Patient presents with  . Altered Mental Status    Courtney Goodman is a 75 y.o. female.  Patient brought in by EMS.  Apparently daughter called them to bring her in.  Patient states that she is got some memory problems feels confused lightheadedness some dizziness some weakness not able to walk with her cane well.  Stroke screen according to EMS was negative.  Her CBG was 374 blood pressure 186/86.  Patient does have a history of hypertension and diabetes history of COPD history of coronary artery disease and chronic kidney disease.  No history of prior stroke.  Obtaining complete history from patient difficult.  We will try to call family member.        Past Medical History:  Diagnosis Date  . Anemia of chronic disease   . Asthma   . Atherosclerotic heart disease of native coronary artery without angina pectoris   . CKD (chronic kidney disease)   . COPD (chronic obstructive pulmonary disease) ([place])   . DM (diabetes mellitus), type 2 with renal complications ([place])   . Dysrhythmia   . Elevated ferritin 01/03/2017  . GAD (generalized anxiety disorder)   . Hypertension   . Mixed hyperlipidemia due to type 2 diabetes mellitus ([place])   . Mixed incontinence    per medical records from [place]  . Osteopenia   . Personal history of noncompliance with medical treatment, presenting hazards to health   . Shortness of breath dyspnea   . Suicidal ideation   . TB (tuberculosis), treated    age 22  . Vitamin D deficiency     Patient Active Problem List   Diagnosis Date Noted  . Poor appetite 09/29/2019  . Assistance needed with transportation 09/29/2019  . Medication noncompliance due to cognitive impairment 10/12/2018  . Severe episode of recurrent major depressive disorder, without psychotic features ([place]) 10/12/2018  . Acute renal  failure superimposed on stage 3 chronic kidney disease ([place]) 09/22/2018  . Leukocytosis 09/22/2018  . Non-intractable vomiting   . GERD (gastroesophageal reflux disease) 08/23/2017  . CAD (coronary artery disease) 08/23/2017  . Hypoglycemia 08/23/2017  . Acute metabolic encephalopathy 88/87/5797  . Hypokalemia 08/23/2017  . Type II diabetes mellitus with renal manifestations ([place]) 08/23/2017  . Elevated ferritin 01/03/2017  . Personal history of noncompliance with medical treatment, presenting hazards to health   . Urinary incontinence, mixed 05/27/2016  . Major depression, recurrent ([place]) 05/27/2016  . Atherosclerotic heart disease of native coronary artery without angina pectoris 05/27/2016  . GAD (generalized anxiety disorder)   . Anemia of chronic disease   . Mixed hyperlipidemia due to type 2 diabetes mellitus ([place])   . Diabetes ([place]) 11/30/2015  . Vitamin D deficiency 11/23/2015  . HTN (hypertension) 11/23/2015  . Anemia 11/23/2015  . CKD (chronic kidney disease), stage III ([place]) 11/23/2015  . Dysrhythmia   . COPD (chronic obstructive pulmonary disease) ([place])     Past Surgical History:  Procedure Laterality Date  . ANKLE RECONSTRUCTION     right  . CARPAL TUNNEL RELEASE    . CORONARY ANGIOPLASTY       OB History   No obstetric history on file.     Family History  Problem Relation Age of Onset  . Diabetes Mother     Social History   Tobacco Use  . Smoking status: Former  Smoker  . Smokeless tobacco: Never Used  Vaping Use  . Vaping Use: Never used  Substance Use Topics  . Alcohol use: No  . Drug use: No    Home Medications Prior to Admission medications   Medication Sig Start Date End Date Taking? Authorizing Provider  albuterol (PROVENTIL HFA;VENTOLIN HFA) 108 (90 Base) MCG/ACT inhaler Inhale 2 puffs into the lungs every 6 (six) hours as needed for wheezing or shortness of breath. 04/14/17   Henson, Vickie L, NP-C  Alcohol Swabs (ALCOHOL PREP) PADS Use  for testing Patient taking differently: 1 each by Other route 2 (two) times daily. E11.9 08/07/18   Henson, Vickie L, NP-C  amLODipine (NORVASC) 5 MG tablet  01/13/20   [provider]  amoxicillin (AMOXIL) 500 MG capsule Take 1 capsule (500 mg total) by mouth 2 (two) times daily. 4/43/15   Delora Fuel, MD  aspirin EC 81 MG tablet Take 81 mg by mouth daily.     [provider]  Biotin 10000 MCG TABS Take 1 tablet by mouth daily.     [provider]  Blood Glucose Monitoring Suppl (TRUE METRIX METER) w/Device KIT 1 each by Does not apply route 2 (two) times daily. Use to monitor glucose levels BID; E11.29 Patient taking differently: 1 each by Does not apply route 2 (two) times daily. E11.29 10/16/18   Renato Shin, MD  carvedilol (COREG) 6.25 MG tablet Take 1 tablet by mouth twice daily. 09/06/19   Henson, Vickie L, NP-C  Cholecalciferol 25 MCG (1000 UT) tablet Take 1 tablet by mouth daily. 08/11/19   Henson, Vickie L, NP-C  citalopram (CELEXA) 20 MG tablet Take 1 tablet (20 mg total) by mouth daily. Patient taking differently: Take 30 mg by mouth daily. 09/05/16   Henson, Vickie L, NP-C  fenofibrate (TRICOR) 145 MG tablet Take 1 tablet by mouth daily. 09/06/19   Henson, Vickie L, NP-C  fluticasone (FLONASE) 50 MCG/ACT nasal spray Place 2 sprays into both nostrils daily. 07/25/16   Henson, Vickie L, NP-C  folic acid (FOLVITE) 1 MG tablet Take 1 tablet (1 mg total) by mouth daily. 08/09/19   Henson, Vickie L, NP-C  Insulin NPH, Human,, Isophane, (NOVOLIN N FLEXPEN) 100 UNIT/ML Kiwkpen Inject 15 Units into the skin every morning. And pen needles 1/day 04/18/20   Renato Shin, MD  mirtazapine (REMERON) 7.5 MG tablet  03/02/20   [provider]  omeprazole (PRILOSEC) 40 MG capsule TAKE 1 CAPSULE(40 MG) BY MOUTH DAILY Patient taking differently: Take 40 mg by mouth daily. 12/13/19   Henson, Vickie L, NP-C  simvastatin (ZOCOR) 20 MG tablet Take 1 tablet by mouth every night.  09/06/19   Henson, Vickie L, NP-C  traZODone (DESYREL) 50 MG tablet Take 50 mg by mouth at bedtime.     [provider]  TRUE METRIX BLOOD GLUCOSE TEST test strip TEST TWICE DAILY 03/12/20   Renato Shin, MD  VICTOZA 18 MG/3ML SOPN Inject 1.8 mg subcutaneously daily. 08/12/19   Renato Shin, MD    Allergies    Patient has no known allergies.  Review of Systems   Review of Systems  Unable to perform ROS: Mental status change    Physical Exam Updated Vital Signs BP (!) 173/93 (BP Location: Right Arm)   Pulse 74   Temp 98.2 F (36.8 C) (Oral)   Resp 18   SpO2 100%   Physical Exam Vitals and nursing note reviewed.  Constitutional:      General: She is  not in acute distress.    Appearance: Normal appearance. She is well-developed.  HENT:     Head: Normocephalic and atraumatic.     Mouth/Throat:     Mouth: Mucous membranes are dry.  Eyes:     Extraocular Movements: Extraocular movements intact.     Conjunctiva/sclera: Conjunctivae normal.     Pupils: Pupils are equal, round, and reactive to light.  Cardiovascular:     Rate and Rhythm: Normal rate and regular rhythm.     Heart sounds: No murmur heard.   Pulmonary:     Effort: Pulmonary effort is normal. No respiratory distress.     Breath sounds: Normal breath sounds.  Abdominal:     Palpations: Abdomen is soft.     Tenderness: There is no abdominal tenderness.  Musculoskeletal:        General: No swelling. Normal range of motion.     Cervical back: Normal range of motion and neck supple.  Skin:    General: Skin is warm and dry.     Capillary Refill: Capillary refill takes less than 2 seconds.  Neurological:     General: No focal deficit present.     Mental Status: She is alert and oriented to person, place, and time.     Cranial Nerves: No cranial nerve deficit.     Sensory: No sensory deficit.     Motor: Weakness present.     Comments: Patient with some slight weakness to the right lower extremity.  No  weakness to the upper extremities.  Also with difficulty following finger however if you ask her to look to the left look to the right down and up she is able to do that.     ED Results / Procedures / Treatments   Labs (all labs ordered are listed, but only abnormal results are displayed) Labs Reviewed  RESP PANEL BY RT-PCR (FLU A&B, COVID) ARPGX2  COMPREHENSIVE METABOLIC PANEL  CBC WITH DIFFERENTIAL/PLATELET  URINALYSIS, ROUTINE W REFLEX MICROSCOPIC    EKG None  Radiology No results found.  Procedures Procedures   Medications Ordered in ED Medications  0.9 %  sodium chloride infusion (has no administration in time range)  sodium chloride 0.9 % bolus 500 mL (has no administration in time range)    ED Course  I have reviewed the triage vital signs and the nursing notes.  Pertinent labs & imaging results that were available during my care of the patient were reviewed by me and considered in my medical decision making (see chart for details).    MDM Rules/Calculators/A&P                          Will try to contact family member to see when onset of symptoms were.  Patient without any significant neurodeficits that would require tPA.  Patient does seem to have some memory problem does seem to have some weakness slight to the right lower extremity.  Patient is also having some dizziness.  Blood pressure is elevated here suspect she may have not been taking her blood pressure medicine according to EMS blood sugar was in 300s may not be taking her diabetic medicine.  Patient clinically is dehydrated here.  Will get CT head chest x-ray labs will give 500 cc bolus and then 75 cc an hour and then adjust from there.  Patient may require MRI of brain.     Final Clinical Impression(s) / ED Diagnoses Final diagnoses:  Altered mental  status, unspecified altered mental status type  Primary hypertension  Dizziness    Rx / DC Orders ED Discharge Orders    None        Fredia Sorrow, MD 06/05/20 1428   Addendum:  Spoke with patient's daughter.  Daughter I spoke with on the phone [name] spoke with her mother at 62 this morning everything was fine.  However the daughter does state that her mother has had trouble with confusion difficulty walking lightheadedness and dizziness for about 6 months.  So none of that is new.  Her sister went to see her shortly before EMS was called and she was laying on the couch and said she did not feel well.  So they were concerned that she had suddenly become ill.  EMS noted the blood sugar was elevated.  Aced on this information it does not seem to be anything suggestive of an acute stroke.  However will get CT head.  Daughter does state that patient did have stroke many years ago.    Fredia Sorrow, MD 06/05/20 1435

## 2020-06-05 NOTE — ED Notes (Signed)
Talking to daughter on phone, asymptomatic at present lying in bed.

## 2020-06-05 NOTE — ED Provider Notes (Signed)
I have reviewed the patient's labs, urinalysis reveals a little bit of glucose but no infection, metabolic panel shows no worsening of renal function in fact it is better than usual, CBC shows no significant anemia, better than usual and no leukocytosis.  Negative for COVID, x-ray negative for pneumonia, CT negative for acute stroke or mass, the patient is stable for discharge.  She does have high blood pressure, it has been persistently elevated, she is on carvedilol and amlodipine, these have been ordered for the patient prior to discharge and I will increase her dose of amlodipine to 10 mg a day.  I discussed this with the patient and the daughter, they are in agreement and will follow-up in the outpatient setting.  She does not appear to be in distress   Noemi Chapel, MD 06/05/20 2059

## 2020-06-05 NOTE — ED Triage Notes (Signed)
AMS. Feels disoriented, with dizziness, weakness, lightheadedness today. Stroke screen negative. CBG 374, BP 186/86

## 2020-06-07 ENCOUNTER — Encounter: Payer: Self-pay | Admitting: Family Medicine

## 2020-06-07 ENCOUNTER — Ambulatory Visit (INDEPENDENT_AMBULATORY_CARE_PROVIDER_SITE_OTHER): Payer: Medicare HMO | Admitting: Family Medicine

## 2020-06-07 VITALS — BP 120/68 | HR 67 | Wt 134.0 lb

## 2020-06-07 DIAGNOSIS — Z9289 Personal history of other medical treatment: Secondary | ICD-10-CM

## 2020-06-07 DIAGNOSIS — I10 Essential (primary) hypertension: Secondary | ICD-10-CM

## 2020-06-07 DIAGNOSIS — R630 Anorexia: Secondary | ICD-10-CM | POA: Diagnosis not present

## 2020-06-07 DIAGNOSIS — H9192 Unspecified hearing loss, left ear: Secondary | ICD-10-CM | POA: Diagnosis not present

## 2020-06-07 DIAGNOSIS — N183 Chronic kidney disease, stage 3 unspecified: Secondary | ICD-10-CM

## 2020-06-07 DIAGNOSIS — R296 Repeated falls: Secondary | ICD-10-CM | POA: Diagnosis not present

## 2020-06-07 DIAGNOSIS — D649 Anemia, unspecified: Secondary | ICD-10-CM

## 2020-06-07 DIAGNOSIS — Z794 Long term (current) use of insulin: Secondary | ICD-10-CM | POA: Diagnosis not present

## 2020-06-07 DIAGNOSIS — F332 Major depressive disorder, recurrent severe without psychotic features: Secondary | ICD-10-CM

## 2020-06-07 DIAGNOSIS — Z9114 Patient's other noncompliance with medication regimen: Secondary | ICD-10-CM

## 2020-06-07 DIAGNOSIS — E1122 Type 2 diabetes mellitus with diabetic chronic kidney disease: Secondary | ICD-10-CM

## 2020-06-07 NOTE — Patient Instructions (Signed)
Continue on your current medications but please make sure you are eating or drinking a supplement such as Glucerna or Ensure if you are taking insulin. Keep a close eye on your blood sugar.  Follow-up with your kidney specialist and diabetes specialist.  I also recommend reaching out to your psychiatrist and neurologist for further evaluation and treatment.  I will put in a referral to audiology and they should contact you regarding trying to help you with a hearing aid.  Follow-up here in 3 months or sooner if you have any questions

## 2020-06-07 NOTE — Progress Notes (Signed)
Subjective:    Patient ID: Courtney Goodman, female    DOB: [birth year], 75 y.o.   MRN: [id number]  HPI Chief Complaint  Patient presents with  . Hospitalization Follow-up    BS went up and BP went high too. Had altered mental status   She is here with her daughter [name] for a follow up after an ED visit for altered mental status.  Patient reports feeling much improved today and basically back to her baseline.  States in the ED her blood sugars and blood pressure were both quite elevated.  Her CBG was 374 and her blood pressure was 186/86.  She has history of complicated diabetes, hypertension with renal disease and COPD. She is being followed by nephrology, endocrinology and also has a psychiatrist and neurologist.  States her nephrologist stopped her amlodipine due to hypotension but then in the ED she was started back on the amlodipine due to elevated blood pressure.  She has upcoming appointments with her nephrologist and Dr. Loanne Drilling, her endocrinologist. Dr. Loanne Drilling has been working on her insulin and diabetes medications per her daughter.  Patient and daughter both feel that she is no longer able to care for herself.  States she often does not eat due to a poor appetite and she still gives herself insulin even though she is not eating.  She has a history of hypoglycemia and visits to the emergency department.  She also reports a history of frequent falls. Today she is using a cane but she does have a walker in her home.  Currently she has an aide who comes to her home for couple hours each day but this no longer seems to be adequate.  Her daughter states she and her husband are going to have the patient move in with them very soon so they can care for her.  Daughter states she is not willing to put her mother in a long-term care facility.  They would like a referral to the audiologist to help with getting a hearing aid for her left ear.  States she had 1 but then lost it several years  ago.  Denies fever, chills, dizziness, chest pain, palpitations, shortness of breath, abdominal pain, N/V/D, urinary symptoms, LE edema.     Review of Systems Pertinent positives and negatives in the history of present illness.     Objective:   Physical Exam BP 120/68   Pulse 67   Wt 134 lb (60.8 kg)   SpO2 97%   BMI 23.00 kg/m   Alert and in no distress. Cardiac exam shows a regular rate and rhythm. lungs are clear to auscultation. Extremities without edema.  Normal pulses.  Skin is warm and dry.  Equal grip strength and normal range of motion and strength of upper and lower extremities.  Ambulating with a cane.  PERRLA and EOMs intact. Mood is depressed and affect is flat.  Normal speech.       Assessment & Plan:  Type 2 diabetes mellitus with stage 3 chronic kidney disease, with long-term current use of insulin, unspecified whether stage 3a or 3b CKD ([place]) - Plan: Comprehensive metabolic panel  Primary hypertension - Plan: Comprehensive metabolic panel  Hearing loss of left ear, unspecified hearing loss type - Plan: Ambulatory referral to Audiology  Poor appetite  Anemia, unspecified type  Severe episode of recurrent major depressive disorder, without psychotic features ([place])  Frequent falls  Medication noncompliance due to cognitive impairment  History of use of hearing aid -  Plan: Ambulatory referral to Audiology  Review of the ED notes and results show that she had a thorough work-up and stroke was ruled out.  CT head was negative.  Labs were stable and no worsening of her renal function or anemia was found.  Chest x-ray was negative for pneumonia.  She was negative for COVID.  Amlodipine was started back.  No new symptoms since discharge. I thanked her daughter for being with her today and the patient will be moving in soon with daughter and son-in-law. I recommend follow-up with nephrologist and endocrinologist in the next couple of weeks. I recommend she use  her walker when no one is with her and try to prevent falls. Encouraged her to add Glucerna or Ensure 2/day for now while she is not eating enough calories.  Encouraged her to check her blood sugar before giving herself insulin.  She is aware that if her blood sugar is low that she should not give herself insulin.   Recommend due to concerns with depression and memory that they follow-up with her psychiatrist and neurologist. I am referring her to audiology for evaluation and potentially getting a hearing aid per patient request. I will recheck labs due to potassium being elevated and follow-up

## 2020-06-08 LAB — COMPREHENSIVE METABOLIC PANEL
ALT: 7 IU/L (ref 0–32)
AST: 19 IU/L (ref 0–40)
Albumin/Globulin Ratio: 1.4 (ref 1.2–2.2)
Albumin: 4.3 g/dL (ref 3.7–4.7)
Alkaline Phosphatase: 54 IU/L (ref 44–121)
BUN/Creatinine Ratio: 16 (ref 12–28)
BUN: 36 mg/dL — ABNORMAL HIGH (ref 8–27)
Bilirubin Total: 0.3 mg/dL (ref 0.0–1.2)
CO2: 19 mmol/L — ABNORMAL LOW (ref 20–29)
Calcium: 10.4 mg/dL — ABNORMAL HIGH (ref 8.7–10.3)
Chloride: 99 mmol/L (ref 96–106)
Creatinine, Ser: 2.19 mg/dL — ABNORMAL HIGH (ref 0.57–1.00)
Globulin, Total: 3 g/dL (ref 1.5–4.5)
Glucose: 239 mg/dL — ABNORMAL HIGH (ref 65–99)
Potassium: 4.4 mmol/L (ref 3.5–5.2)
Sodium: 137 mmol/L (ref 134–144)
Total Protein: 7.3 g/dL (ref 6.0–8.5)
eGFR: 23 mL/min/{1.73_m2} — ABNORMAL LOW (ref >59)

## 2020-06-08 NOTE — Progress Notes (Signed)
Her potassium is normal again. Labs are stable.

## 2020-06-12 ENCOUNTER — Inpatient Hospital Stay: Payer: Medicare HMO

## 2020-06-14 ENCOUNTER — Telehealth: Payer: Self-pay | Admitting: Neurology

## 2020-06-14 NOTE — Telephone Encounter (Signed)
Patient's daughter [name] called and requested a medicine to help with her mom's memory.  [facility]  90 day Rx

## 2020-06-14 NOTE — Telephone Encounter (Signed)
Ok to schedule with Clarise Cruz this or next week, will need MOCA on her appt. Thank you!

## 2020-06-16 DIAGNOSIS — Z794 Long term (current) use of insulin: Secondary | ICD-10-CM | POA: Diagnosis not present

## 2020-06-16 DIAGNOSIS — J449 Chronic obstructive pulmonary disease, unspecified: Secondary | ICD-10-CM | POA: Diagnosis not present

## 2020-06-16 DIAGNOSIS — N3289 Other specified disorders of bladder: Secondary | ICD-10-CM | POA: Diagnosis not present

## 2020-06-16 DIAGNOSIS — I10 Essential (primary) hypertension: Secondary | ICD-10-CM | POA: Diagnosis not present

## 2020-06-16 DIAGNOSIS — E1165 Type 2 diabetes mellitus with hyperglycemia: Secondary | ICD-10-CM | POA: Diagnosis not present

## 2020-06-16 DIAGNOSIS — R4182 Altered mental status, unspecified: Secondary | ICD-10-CM | POA: Diagnosis not present

## 2020-06-16 DIAGNOSIS — E871 Hypo-osmolality and hyponatremia: Secondary | ICD-10-CM | POA: Diagnosis not present

## 2020-06-16 DIAGNOSIS — R1032 Left lower quadrant pain: Secondary | ICD-10-CM | POA: Diagnosis not present

## 2020-06-16 DIAGNOSIS — N1831 Chronic kidney disease, stage 3a: Secondary | ICD-10-CM | POA: Diagnosis not present

## 2020-06-16 DIAGNOSIS — E119 Type 2 diabetes mellitus without complications: Secondary | ICD-10-CM | POA: Diagnosis not present

## 2020-06-16 DIAGNOSIS — I4510 Unspecified right bundle-branch block: Secondary | ICD-10-CM | POA: Diagnosis not present

## 2020-06-16 DIAGNOSIS — R410 Disorientation, unspecified: Secondary | ICD-10-CM | POA: Diagnosis not present

## 2020-06-16 DIAGNOSIS — R404 Transient alteration of awareness: Secondary | ICD-10-CM | POA: Diagnosis not present

## 2020-06-16 DIAGNOSIS — R112 Nausea with vomiting, unspecified: Secondary | ICD-10-CM | POA: Diagnosis not present

## 2020-06-17 DIAGNOSIS — R29818 Other symptoms and signs involving the nervous system: Secondary | ICD-10-CM | POA: Diagnosis not present

## 2020-06-17 DIAGNOSIS — I639 Cerebral infarction, unspecified: Secondary | ICD-10-CM | POA: Diagnosis not present

## 2020-06-17 DIAGNOSIS — R410 Disorientation, unspecified: Secondary | ICD-10-CM | POA: Diagnosis not present

## 2020-06-18 DIAGNOSIS — I639 Cerebral infarction, unspecified: Secondary | ICD-10-CM | POA: Diagnosis not present

## 2020-06-18 DIAGNOSIS — F05 Delirium due to known physiological condition: Secondary | ICD-10-CM | POA: Diagnosis not present

## 2020-06-18 DIAGNOSIS — E871 Hypo-osmolality and hyponatremia: Secondary | ICD-10-CM | POA: Diagnosis not present

## 2020-06-18 DIAGNOSIS — I517 Cardiomegaly: Secondary | ICD-10-CM | POA: Diagnosis not present

## 2020-06-18 DIAGNOSIS — R29818 Other symptoms and signs involving the nervous system: Secondary | ICD-10-CM | POA: Diagnosis not present

## 2020-06-18 DIAGNOSIS — R4182 Altered mental status, unspecified: Secondary | ICD-10-CM | POA: Diagnosis not present

## 2020-06-18 DIAGNOSIS — E1165 Type 2 diabetes mellitus with hyperglycemia: Secondary | ICD-10-CM | POA: Diagnosis not present

## 2020-06-18 DIAGNOSIS — I69354 Hemiplegia and hemiparesis following cerebral infarction affecting left non-dominant side: Secondary | ICD-10-CM | POA: Diagnosis not present

## 2020-06-18 DIAGNOSIS — I340 Nonrheumatic mitral (valve) insufficiency: Secondary | ICD-10-CM | POA: Diagnosis not present

## 2020-06-18 DIAGNOSIS — I129 Hypertensive chronic kidney disease with stage 1 through stage 4 chronic kidney disease, or unspecified chronic kidney disease: Secondary | ICD-10-CM | POA: Diagnosis not present

## 2020-06-18 DIAGNOSIS — N183 Chronic kidney disease, stage 3 unspecified: Secondary | ICD-10-CM | POA: Diagnosis not present

## 2020-06-18 DIAGNOSIS — R410 Disorientation, unspecified: Secondary | ICD-10-CM | POA: Diagnosis not present

## 2020-06-18 DIAGNOSIS — J449 Chronic obstructive pulmonary disease, unspecified: Secondary | ICD-10-CM | POA: Diagnosis not present

## 2020-06-18 DIAGNOSIS — E1122 Type 2 diabetes mellitus with diabetic chronic kidney disease: Secondary | ICD-10-CM | POA: Diagnosis not present

## 2020-06-18 DIAGNOSIS — R69 Illness, unspecified: Secondary | ICD-10-CM | POA: Insufficient documentation

## 2020-06-20 ENCOUNTER — Other Ambulatory Visit: Payer: Self-pay

## 2020-06-20 ENCOUNTER — Ambulatory Visit (INDEPENDENT_AMBULATORY_CARE_PROVIDER_SITE_OTHER): Payer: Medicare HMO | Admitting: Endocrinology

## 2020-06-20 VITALS — BP 150/78 | HR 69 | Ht 64.0 in | Wt 136.2 lb

## 2020-06-20 DIAGNOSIS — Z794 Long term (current) use of insulin: Secondary | ICD-10-CM | POA: Diagnosis not present

## 2020-06-20 DIAGNOSIS — N1831 Chronic kidney disease, stage 3a: Secondary | ICD-10-CM

## 2020-06-20 DIAGNOSIS — E1122 Type 2 diabetes mellitus with diabetic chronic kidney disease: Secondary | ICD-10-CM | POA: Diagnosis not present

## 2020-06-20 LAB — POCT GLYCOSYLATED HEMOGLOBIN (HGB A1C): Hemoglobin A1C: 8.9 % — AB (ref 4.0–5.6)

## 2020-06-20 MED ORDER — NOVOLIN N FLEXPEN 100 UNIT/ML ~~LOC~~ SUPN: 16.0000 [IU] | PEN_INJECTOR | SUBCUTANEOUS | 11 refills | Status: DC

## 2020-06-20 NOTE — Patient Instructions (Addendum)
Please increase the NPH insulin to 16 units each morning.  It is important to swirl this before injecting.  Please continue the same Victoza.    On this type of insulin schedule, you should eat meals on a regular schedule.  If a meal is missed or significantly delayed, your blood sugar could go low. check your blood sugar twice a day.  vary the time of day when you check, between before the 3 meals, and at bedtime.  also check if you have symptoms of your blood sugar being too high or too low.  please keep a record of the readings and bring it to your next appointment here (or you can bring the meter itself).  You can write it on any piece of paper.  please call us sooner if your blood sugar goes below 70, or if you have a lot of readings over 200.   Please come back for a follow-up appointment in 2 months.

## 2020-06-20 NOTE — Progress Notes (Signed)
Subjective:    Patient ID: Courtney Goodman, female    DOB: 07/30/1945, 75 y.o.   MRN: [id number]  HPI Pt returns for f/u of diabetes mellitus:  DM type: Insulin-requiring type 2 Dx'ed: 6063 Complications: stage 4 CRI and CAD.  Therapy: insulin since 2009, and Victoza. GDM: never DKA: never Severe hypoglycemia: 2012, 2019, 2021, and 2022.   Pancreatitis: never.  SDOH: ins declined tresiba; she lives alone a caretaker puts insulin out for pt, but she takes herself in the morning, when she is alone.   Other: she is on qd insulin, due to h/o noncompliance; she takes NPH insulin, due to the pattern of cbg's.   Interval history: pt has not recently checked cbg, but she takes insulin as rx'ed.  Pt is here today with dtr Courtney Goodman.    Past Medical History:  Diagnosis Date  . Anemia of chronic disease   . Asthma   . Atherosclerotic heart disease of native coronary artery without angina pectoris   . CKD (chronic kidney disease)   . COPD (chronic obstructive pulmonary disease) ([place])   . DM (diabetes mellitus), type 2 with renal complications ([place])   . Dysrhythmia   . Elevated ferritin 01/03/2017  . GAD (generalized anxiety disorder)   . Hypertension   . Mixed hyperlipidemia due to type 2 diabetes mellitus ([place])   . Mixed incontinence    per medical records from [facility]  . Osteopenia   . Personal history of noncompliance with medical treatment, presenting hazards to health   . Shortness of breath dyspnea   . Suicidal ideation   . TB (tuberculosis), treated    age 47  . Vitamin D deficiency     Past Surgical History:  Procedure Laterality Date  . ANKLE RECONSTRUCTION     right  . CARPAL TUNNEL RELEASE    . CORONARY ANGIOPLASTY      Social History   Socioeconomic History  . Marital status: Divorced    Spouse name: Not on file  . Number of children: 2  . Years of education: 10  . Highest education level: High school graduate  Occupational History  . Occupation: Retired  Tobacco  Use  . Smoking status: Former Research scientist (life sciences)  . Smokeless tobacco: Never Used  Vaping Use  . Vaping Use: Never used  Substance and Sexual Activity  . Alcohol use: No  . Drug use: No  . Sexual activity: Never  Other Topics Concern  . Not on file  Social History Narrative   Lives alone    Left handed   Social Determinants of Health   Financial Resource Strain: Medium Risk  . Difficulty of Paying Living Expenses: Somewhat hard  Food Insecurity: No Food Insecurity  . Worried About Charity fundraiser in the Last Year: Never true  . Ran Out of Food in the Last Year: Never true  Transportation Needs: No Transportation Needs  . Lack of Transportation (Medical): No  . Lack of Transportation (Non-Medical): No  Physical Activity: Inactive  . Days of Exercise per Week: 0 days  . Minutes of Exercise per Session: 0 min  Stress: Stress Concern Present  . Feeling of Stress : To some extent  Social Connections: Moderately Isolated  . Frequency of Communication with Friends and Family: More than three times a week  . Frequency of Social Gatherings with Friends and Family: More than three times a week  . Attends Religious Services: More than 4 times per year  . Active Member of  Clubs or Organizations: No  . Attends Archivist Meetings: Never  . Marital Status: Divorced  Human resources officer Violence: Not At Risk  . Fear of Current or Ex-Partner: No  . Emotionally Abused: No  . Physically Abused: No  . Sexually Abused: No    Current Outpatient Medications on File Prior to Visit  Medication Sig Dispense Refill  . albuterol (PROVENTIL HFA;VENTOLIN HFA) 108 (90 Base) MCG/ACT inhaler Inhale 2 puffs into the lungs every 6 (six) hours as needed for wheezing or shortness of breath. 1 Inhaler 0  . Alcohol Swabs (ALCOHOL PREP) PADS Use for testing (Patient taking differently: 1 each by Other route 2 (two) times daily. E11.9) 100 each 1  . amLODipine (NORVASC) 10 MG tablet Take 1 tablet (10 mg  total) by mouth daily. 30 tablet 0  . aspirin EC 81 MG tablet Take 81 mg by mouth daily.     . Biotin 10000 MCG TABS Take 1 tablet by mouth daily.     . Blood Glucose Monitoring Suppl (TRUE METRIX METER) w/Device KIT 1 each by Does not apply route 2 (two) times daily. Use to monitor glucose levels BID; E11.29 (Patient taking differently: 1 each by Does not apply route 2 (two) times daily. E11.29) 1 kit 0  . carvedilol (COREG) 6.25 MG tablet Take 1 tablet by mouth twice daily. 180 tablet 0  . Cholecalciferol 25 MCG (1000 UT) tablet Take 1 tablet by mouth daily. 90 tablet 0  . citalopram (CELEXA) 20 MG tablet Take 1 tablet (20 mg total) by mouth daily. (Patient taking differently: Take 30 mg by mouth daily.) 90 tablet 1  . fenofibrate (TRICOR) 145 MG tablet Take 1 tablet by mouth daily. 90 tablet 0  . fluticasone (FLONASE) 50 MCG/ACT nasal spray Place 2 sprays into both nostrils daily. 48 g 0  . folic acid (FOLVITE) 1 MG tablet Take 1 tablet (1 mg total) by mouth daily. 90 tablet 0  . mirtazapine (REMERON) 7.5 MG tablet     . omeprazole (PRILOSEC) 40 MG capsule TAKE 1 CAPSULE(40 MG) BY MOUTH DAILY (Patient taking differently: Take 40 mg by mouth daily.) 90 capsule 0  . simvastatin (ZOCOR) 20 MG tablet Take 1 tablet by mouth every night. 90 tablet 0  . traZODone (DESYREL) 50 MG tablet Take 50 mg by mouth at bedtime.     . TRUE METRIX BLOOD GLUCOSE TEST test strip TEST TWICE DAILY 150 strip 3  . VICTOZA 18 MG/3ML SOPN Inject 1.8 mg subcutaneously daily. 27 mL 0   No current facility-administered medications on file prior to visit.    No Known Allergies  Family History  Problem Relation Age of Onset  . Diabetes Mother     BP (!) 150/78 (BP Location: Right Arm, Patient Position: Sitting, Cuff Size: Normal)   Pulse 69   Ht 5' 4"  (1.626 m)   Wt 136 lb 3.2 oz (61.8 kg)   SpO2 99%   BMI 23.38 kg/m    Review of Systems     Objective:   Physical Exam VITAL SIGNS:  See vs page GENERAL:  no distress.  In [place].    Lab Results  Component Value Date   HGBA1C 8.9 (A) 06/20/2020       Assessment & Plan:  Insulin-requiring type 2 DM: uncontrolled.   Patient Instructions  Please increase the NPH insulin to 16 units each morning.  It is important to swirl this before injecting.  Please continue the same Victoza.  On this type of insulin schedule, you should eat meals on a regular schedule.  If a meal is missed or significantly delayed, your blood sugar could go low. check your blood sugar twice a day.  vary the time of day when you check, between before the 3 meals, and at bedtime.  also check if you have symptoms of your blood sugar being too high or too low.  please keep a record of the readings and bring it to your next appointment here (or you can bring the meter itself).  You can write it on any piece of paper.  please call us sooner if your blood sugar goes below 70, or if you have a lot of readings over 200.   Please come back for a follow-up appointment in 2 months.

## 2020-06-21 ENCOUNTER — Telehealth: Payer: Self-pay | Admitting: Endocrinology

## 2020-06-21 MED ORDER — PEN NEEDLES 32G X 5 MM MISC: 1.0000 | Freq: Every day | 3 refills | Status: DC

## 2020-06-21 NOTE — Telephone Encounter (Signed)
Falisha- pill pack by [facility] is calling regarding  Insulin NPH, Human,, Isophane, (NOVOLIN N FLEXPEN) 100 UNIT/ML Kiwkpen prescription needs to be written for the pen needles and how many in quantity.

## 2020-06-21 NOTE — Telephone Encounter (Signed)
I have sent a prescription to your pharmacy  

## 2020-06-27 ENCOUNTER — Ambulatory Visit (INDEPENDENT_AMBULATORY_CARE_PROVIDER_SITE_OTHER): Payer: Medicare HMO | Admitting: Podiatry

## 2020-06-27 ENCOUNTER — Other Ambulatory Visit: Payer: Self-pay

## 2020-06-27 ENCOUNTER — Encounter: Payer: Self-pay | Admitting: Podiatry

## 2020-06-27 DIAGNOSIS — B351 Tinea unguium: Secondary | ICD-10-CM

## 2020-06-27 DIAGNOSIS — Z794 Long term (current) use of insulin: Secondary | ICD-10-CM | POA: Diagnosis not present

## 2020-06-27 DIAGNOSIS — N183 Chronic kidney disease, stage 3 unspecified: Secondary | ICD-10-CM

## 2020-06-27 DIAGNOSIS — M79609 Pain in unspecified limb: Secondary | ICD-10-CM

## 2020-06-27 DIAGNOSIS — L84 Corns and callosities: Secondary | ICD-10-CM | POA: Diagnosis not present

## 2020-06-27 DIAGNOSIS — E1122 Type 2 diabetes mellitus with diabetic chronic kidney disease: Secondary | ICD-10-CM

## 2020-06-27 NOTE — Progress Notes (Signed)
  Subjective:  Patient ID: Courtney Goodman, female    DOB: 09-21-1945,  MRN: [id number]  75 y.o. female presents with at risk foot care. Pt has h/o NIDDM with chronic kidney disease and painful thick toenails that are difficult to trim. Pain interferes with ambulation. Aggravating factors include wearing enclosed shoe gear. Pain is relieved with periodic professional debridement..    Patient's blood sugar was 61 mg/dl today. Patient states she has not eaten since this morning.  She is accompanied by her daughter, [name], on today's visit. [name] states her Mom has been very ill, has lost a significant amount of weight. She also now has memory problems and problems controlling her blood sugar.  PCP: [name], NP-C and last visit was: Jun 07, 2020. She also sees Dr. Renato Shin for her diabetes.   Review of Systems: Negative except as noted in the HPI.   No Known Allergies  Objective:  There were no vitals filed for this visit. Constitutional Patient is a pleasant 75 y.o. African American female in NAD. AAO x 3.  Vascular Neurovascular status unchanged b/l lower extremities. Capillary fill time to digits <3 seconds b/l lower extremities. Faintly palpable pedal pulses b/l. Pedal hair absent. Lower extremity skin temperature gradient within normal limits. No pain with calf compression b/l. No edema noted b/l lower extremities. No cyanosis or clubbing noted.  Neurologic Normal speech. Protective sensation intact 5/5 intact bilaterally with 10g monofilament b/l.  Dermatologic Pedal skin with normal turgor, texture and tone bilaterally. No open wounds bilaterally. No interdigital macerations bilaterally. Toenails 1-5 b/l elongated, discolored, dystrophic, thickened, crumbly with subungual debris and tenderness to dorsal palpation. Hyperkeratotic lesion(s) submet head 5 left foot.  No erythema, no edema, no drainage, no fluctuance.  Orthopedic: Normal muscle strength 5/5 to all lower extremity  muscle groups bilaterally. No pain crepitus or joint limitation noted with ROM b/l. Plantarflexed metatarsal(s) 5th metatarsal head b/l lower extremities.   Hemoglobin A1C Latest Ref Rng & Units 06/20/2020 03/02/2020 11/26/2019 09/15/2019  HGBA1C 4.0 - 5.6 % 8.9(A) 8.2(A) 7.9(A) 8.2(A)  Some recent data might be hidden   Assessment:   1. Pain due to onychomycosis of nail   2. Callus   3. Type 2 diabetes mellitus with stage 3 chronic kidney disease, with long-term current use of insulin, unspecified whether stage 3a or 3b CKD ([place])    Plan:  Patient was evaluated and treated and all questions answered.  Onychomycosis with pain -Nails palliatively debridement as below. -Educated on self-care  Procedure: Nail Debridement Rationale: Pain Type of Debridement: manual, sharp debridement. Instrumentation: Nail nipper, rotary burr. Number of Nails: 10  -Examined patient. -We did provide patient snacks and water during her visit since she had not eaten. -Continue diabetic foot care principles. -Patient to continue soft, supportive shoe gear daily. -Toenails 1-5 b/l were debrided in length and girth with sterile nail nippers and dremel without iatrogenic bleeding.  -Callus(es) submet head 5 left foot pared utilizing sterile scalpel blade without complication or incident. Total number debrided =1. -Patient to report any pedal injuries to medical professional immediately. -Patient/POA to call should there be question/concern in the interim.  Return in about 3 months (around 09/27/2020).  Marzetta Board, DPM

## 2020-06-28 ENCOUNTER — Inpatient Hospital Stay: Payer: Medicare HMO

## 2020-06-28 ENCOUNTER — Inpatient Hospital Stay: Payer: Medicare HMO | Attending: Oncology

## 2020-06-28 DIAGNOSIS — N183 Chronic kidney disease, stage 3 unspecified: Secondary | ICD-10-CM | POA: Insufficient documentation

## 2020-06-28 DIAGNOSIS — D631 Anemia in chronic kidney disease: Secondary | ICD-10-CM | POA: Insufficient documentation

## 2020-06-28 DIAGNOSIS — I129 Hypertensive chronic kidney disease with stage 1 through stage 4 chronic kidney disease, or unspecified chronic kidney disease: Secondary | ICD-10-CM | POA: Insufficient documentation

## 2020-06-28 DIAGNOSIS — Z79899 Other long term (current) drug therapy: Secondary | ICD-10-CM | POA: Insufficient documentation

## 2020-06-30 DIAGNOSIS — H9313 Tinnitus, bilateral: Secondary | ICD-10-CM | POA: Diagnosis not present

## 2020-06-30 DIAGNOSIS — H903 Sensorineural hearing loss, bilateral: Secondary | ICD-10-CM | POA: Diagnosis not present

## 2020-07-11 ENCOUNTER — Other Ambulatory Visit: Payer: Self-pay

## 2020-07-11 ENCOUNTER — Inpatient Hospital Stay: Payer: Medicare HMO

## 2020-07-11 VITALS — BP 128/76 | HR 71 | Temp 98.9°F | Resp 16

## 2020-07-11 DIAGNOSIS — N183 Chronic kidney disease, stage 3 unspecified: Secondary | ICD-10-CM

## 2020-07-11 DIAGNOSIS — D631 Anemia in chronic kidney disease: Secondary | ICD-10-CM | POA: Diagnosis not present

## 2020-07-11 DIAGNOSIS — D638 Anemia in other chronic diseases classified elsewhere: Secondary | ICD-10-CM

## 2020-07-11 DIAGNOSIS — I129 Hypertensive chronic kidney disease with stage 1 through stage 4 chronic kidney disease, or unspecified chronic kidney disease: Secondary | ICD-10-CM | POA: Diagnosis not present

## 2020-07-11 DIAGNOSIS — Z79899 Other long term (current) drug therapy: Secondary | ICD-10-CM | POA: Diagnosis not present

## 2020-07-11 LAB — CBC WITH DIFFERENTIAL (CANCER CENTER ONLY)
Abs Immature Granulocytes: 0.02 10*3/uL (ref 0.00–0.07)
Basophils Absolute: 0 10*3/uL (ref 0.0–0.1)
Basophils Relative: 1 %
Eosinophils Absolute: 0.1 10*3/uL (ref 0.0–0.5)
Eosinophils Relative: 2 %
HCT: 29.9 % — ABNORMAL LOW (ref 36.0–46.0)
Hemoglobin: 9.6 g/dL — ABNORMAL LOW (ref 12.0–15.0)
Immature Granulocytes: 0 %
Lymphocytes Relative: 27 %
Lymphs Abs: 1.7 10*3/uL (ref 0.7–4.0)
MCH: 29.9 pg (ref 26.0–34.0)
MCHC: 32.1 g/dL (ref 30.0–36.0)
MCV: 93.1 fL (ref 80.0–100.0)
Monocytes Absolute: 0.5 10*3/uL (ref 0.1–1.0)
Monocytes Relative: 8 %
Neutro Abs: 4 10*3/uL (ref 1.7–7.7)
Neutrophils Relative %: 62 %
Platelet Count: 267 10*3/uL (ref 150–400)
RBC: 3.21 MIL/uL — ABNORMAL LOW (ref 3.87–5.11)
RDW: 15.8 % — ABNORMAL HIGH (ref 11.5–15.5)
WBC Count: 6.3 10*3/uL (ref 4.0–10.5)
nRBC: 0 % (ref 0.0–0.2)

## 2020-07-11 MED ORDER — EPOETIN ALFA-EPBX 40000 UNIT/ML IJ SOLN
INTRAMUSCULAR | Status: AC
  Filled 2020-07-11: qty 1

## 2020-07-11 MED ORDER — EPOETIN ALFA-EPBX 40000 UNIT/ML IJ SOLN
40000.0000 [IU] | Freq: Once | INTRAMUSCULAR | Status: AC
  Administered 2020-07-11: 40000 [IU] via SUBCUTANEOUS

## 2020-07-11 NOTE — Patient Instructions (Signed)
Epoetin Alfa injection What is this medication? EPOETIN ALFA (e POE e tin AL fa) helps your body make more red blood cells. This medicine is used to treat anemia caused by chronic kidney disease, cancer chemotherapy, or HIV-therapy. It may also be used before surgery if you have anemia. This medicine may be used for other purposes; ask your health care provider or pharmacist if you have questions. COMMON BRAND NAME(S): Epogen, Procrit, Retacrit What should I tell my care team before I take this medication? They need to know if you have any of these conditions: cancer heart disease high blood pressure history of blood clots history of stroke low levels of folate, iron, or vitamin B12 in the blood seizures an unusual or allergic reaction to erythropoietin, albumin, benzyl alcohol, hamster proteins, other medicines, foods, dyes, or preservatives pregnant or trying to get pregnant breast-feeding How should I use this medication? This medicine is for injection into a vein or under the skin. It is usually given by a health care professional in a hospital or clinic setting. If you get this medicine at home, you will be taught how to prepare and give this medicine. Use exactly as directed. Take your medicine at regular intervals. Do not take your medicine more often than directed. It is important that you put your used needles and syringes in a special sharps container. Do not put them in a trash can. If you do not have a sharps container, call your pharmacist or healthcare provider to get one. A special MedGuide will be given to you by the pharmacist with each prescription and refill. Be sure to read this information carefully each time. Talk to your pediatrician regarding the use of this medicine in children. While this drug may be prescribed for selected conditions, precautions do apply. Overdosage: If you think you have taken too much of this medicine contact a poison control center or emergency  room at once. NOTE: This medicine is only for you. Do not share this medicine with others. What if I miss a dose? If you miss a dose, take it as soon as you can. If it is almost time for your next dose, take only that dose. Do not take double or extra doses. What may interact with this medication? Interactions have not been studied. This list may not describe all possible interactions. Give your health care provider a list of all the medicines, herbs, non-prescription drugs, or dietary supplements you use. Also tell them if you smoke, drink alcohol, or use illegal drugs. Some items may interact with your medicine. What should I watch for while using this medication? Your condition will be monitored carefully while you are receiving this medicine. You may need blood work done while you are taking this medicine. This medicine may cause a decrease in vitamin B6. You should make sure that you get enough vitamin B6 while you are taking this medicine. Discuss the foods you eat and the vitamins you take with your health care professional. What side effects may I notice from receiving this medication? Side effects that you should report to your doctor or health care professional as soon as possible: allergic reactions like skin rash, itching or hives, swelling of the face, lips, or tongue seizures signs and symptoms of a blood clot such as breathing problems; changes in vision; chest pain; severe, sudden headache; pain, swelling, warmth in the leg; trouble speaking; sudden numbness or weakness of the face, arm or leg signs and symptoms of a stroke like   changes in vision; confusion; trouble speaking or understanding; severe headaches; sudden numbness or weakness of the face, arm or leg; trouble walking; dizziness; loss of balance or coordination Side effects that usually do not require medical attention (report to your doctor or health care professional if they continue or are  bothersome): chills cough dizziness fever headaches joint pain muscle cramps muscle pain nausea, vomiting pain, redness, or irritation at site where injected This list may not describe all possible side effects. Call your doctor for medical advice about side effects. You may report side effects to FDA at 1-800-FDA-1088. Where should I keep my medication? Keep out of the reach of children. Store in a refrigerator between 2 and 8 degrees C (36 and 46 degrees F). Do not freeze or shake. Throw away any unused portion if using a single-dose vial. Multi-dose vials can be kept in the refrigerator for up to 21 days after the initial dose. Throw away unused medicine. NOTE: This sheet is a summary. It may not cover all possible information. If you have questions about this medicine, talk to your doctor, pharmacist, or health care provider.  2022 Elsevier/Gold Standard (2016-08-23 08:35:19)  

## 2020-07-24 ENCOUNTER — Ambulatory Visit: Payer: Medicare HMO

## 2020-07-24 ENCOUNTER — Other Ambulatory Visit: Payer: Medicare HMO

## 2020-07-26 ENCOUNTER — Encounter: Payer: Self-pay | Admitting: Internal Medicine

## 2020-07-27 ENCOUNTER — Encounter: Payer: Self-pay | Admitting: Internal Medicine

## 2020-08-07 ENCOUNTER — Inpatient Hospital Stay: Payer: Medicare HMO

## 2020-08-07 ENCOUNTER — Inpatient Hospital Stay: Payer: Medicare HMO | Attending: Internal Medicine | Admitting: Internal Medicine

## 2020-08-07 ENCOUNTER — Other Ambulatory Visit: Payer: Self-pay

## 2020-08-07 ENCOUNTER — Telehealth: Payer: Self-pay

## 2020-08-07 VITALS — BP 119/67 | HR 74 | Temp 98.8°F | Resp 18 | Ht 64.0 in | Wt 141.4 lb

## 2020-08-07 DIAGNOSIS — E785 Hyperlipidemia, unspecified: Secondary | ICD-10-CM | POA: Insufficient documentation

## 2020-08-07 DIAGNOSIS — E119 Type 2 diabetes mellitus without complications: Secondary | ICD-10-CM | POA: Diagnosis not present

## 2020-08-07 DIAGNOSIS — Z79899 Other long term (current) drug therapy: Secondary | ICD-10-CM | POA: Diagnosis not present

## 2020-08-07 DIAGNOSIS — I129 Hypertensive chronic kidney disease with stage 1 through stage 4 chronic kidney disease, or unspecified chronic kidney disease: Secondary | ICD-10-CM | POA: Insufficient documentation

## 2020-08-07 DIAGNOSIS — Z7982 Long term (current) use of aspirin: Secondary | ICD-10-CM | POA: Insufficient documentation

## 2020-08-07 DIAGNOSIS — K219 Gastro-esophageal reflux disease without esophagitis: Secondary | ICD-10-CM | POA: Insufficient documentation

## 2020-08-07 DIAGNOSIS — J449 Chronic obstructive pulmonary disease, unspecified: Secondary | ICD-10-CM | POA: Insufficient documentation

## 2020-08-07 DIAGNOSIS — Z794 Long term (current) use of insulin: Secondary | ICD-10-CM | POA: Insufficient documentation

## 2020-08-07 DIAGNOSIS — N183 Chronic kidney disease, stage 3 unspecified: Secondary | ICD-10-CM

## 2020-08-07 DIAGNOSIS — D631 Anemia in chronic kidney disease: Secondary | ICD-10-CM | POA: Insufficient documentation

## 2020-08-07 DIAGNOSIS — N189 Chronic kidney disease, unspecified: Secondary | ICD-10-CM

## 2020-08-07 DIAGNOSIS — M8580 Other specified disorders of bone density and structure, unspecified site: Secondary | ICD-10-CM | POA: Insufficient documentation

## 2020-08-07 DIAGNOSIS — D638 Anemia in other chronic diseases classified elsewhere: Secondary | ICD-10-CM

## 2020-08-07 DIAGNOSIS — I2510 Atherosclerotic heart disease of native coronary artery without angina pectoris: Secondary | ICD-10-CM | POA: Diagnosis not present

## 2020-08-07 LAB — CBC WITH DIFFERENTIAL (CANCER CENTER ONLY)
Abs Immature Granulocytes: 0.03 10*3/uL (ref 0.00–0.07)
Basophils Absolute: 0 10*3/uL (ref 0.0–0.1)
Basophils Relative: 1 %
Eosinophils Absolute: 0.1 10*3/uL (ref 0.0–0.5)
Eosinophils Relative: 2 %
HCT: 32.7 % — ABNORMAL LOW (ref 36.0–46.0)
Hemoglobin: 10.6 g/dL — ABNORMAL LOW (ref 12.0–15.0)
Immature Granulocytes: 1 %
Lymphocytes Relative: 23 %
Lymphs Abs: 1.5 10*3/uL (ref 0.7–4.0)
MCH: 30.4 pg (ref 26.0–34.0)
MCHC: 32.4 g/dL (ref 30.0–36.0)
MCV: 93.7 fL (ref 80.0–100.0)
Monocytes Absolute: 0.4 10*3/uL (ref 0.1–1.0)
Monocytes Relative: 6 %
Neutro Abs: 4.4 10*3/uL (ref 1.7–7.7)
Neutrophils Relative %: 67 %
Platelet Count: 280 10*3/uL (ref 150–400)
RBC: 3.49 MIL/uL — ABNORMAL LOW (ref 3.87–5.11)
RDW: 16.6 % — ABNORMAL HIGH (ref 11.5–15.5)
WBC Count: 6.5 10*3/uL (ref 4.0–10.5)
nRBC: 0 % (ref 0.0–0.2)

## 2020-08-07 LAB — IRON AND TIBC
Iron: 103 ug/dL (ref 41–142)
Saturation Ratios: 33 % (ref 21–57)
TIBC: 310 ug/dL (ref 236–444)
UIBC: 207 ug/dL (ref 120–384)

## 2020-08-07 LAB — FERRITIN: Ferritin: 503 ng/mL — ABNORMAL HIGH (ref 11–307)

## 2020-08-07 MED ORDER — EPOETIN ALFA-EPBX 40000 UNIT/ML IJ SOLN
INTRAMUSCULAR | Status: AC
  Filled 2020-08-07: qty 1

## 2020-08-07 MED ORDER — EPOETIN ALFA-EPBX 40000 UNIT/ML IJ SOLN
40000.0000 [IU] | Freq: Once | INTRAMUSCULAR | Status: AC
  Administered 2020-08-07: 40000 [IU] via SUBCUTANEOUS

## 2020-08-07 NOTE — Telephone Encounter (Signed)
Called Mrs. Hounshell as we could not find her for her injection appt today.  Left her a message to call us back to reschedule her Retacrit.  Gardiner Rhyme, RN

## 2020-08-07 NOTE — Patient Instructions (Signed)
Epoetin Alfa injection What is this medication? EPOETIN ALFA (e POE e tin AL fa) helps your body make more red blood cells. This medicine is used to treat anemia caused by chronic kidney disease, cancer chemotherapy, or HIV-therapy. It may also be used before surgery if you have anemia. This medicine may be used for other purposes; ask your health care provider or pharmacist if you have questions. COMMON BRAND NAME(S): Epogen, Procrit, Retacrit What should I tell my care team before I take this medication? They need to know if you have any of these conditions: cancer heart disease high blood pressure history of blood clots history of stroke low levels of folate, iron, or vitamin B12 in the blood seizures an unusual or allergic reaction to erythropoietin, albumin, benzyl alcohol, hamster proteins, other medicines, foods, dyes, or preservatives pregnant or trying to get pregnant breast-feeding How should I use this medication? This medicine is for injection into a vein or under the skin. It is usually given by a health care professional in a hospital or clinic setting. If you get this medicine at home, you will be taught how to prepare and give this medicine. Use exactly as directed. Take your medicine at regular intervals. Do not take your medicine more often than directed. It is important that you put your used needles and syringes in a special sharps container. Do not put them in a trash can. If you do not have a sharps container, call your pharmacist or healthcare provider to get one. A special MedGuide will be given to you by the pharmacist with each prescription and refill. Be sure to read this information carefully each time. Talk to your pediatrician regarding the use of this medicine in children. While this drug may be prescribed for selected conditions, precautions do apply. Overdosage: If you think you have taken too much of this medicine contact a poison control center or emergency  room at once. NOTE: This medicine is only for you. Do not share this medicine with others. What if I miss a dose? If you miss a dose, take it as soon as you can. If it is almost time for your next dose, take only that dose. Do not take double or extra doses. What may interact with this medication? Interactions have not been studied. This list may not describe all possible interactions. Give your health care provider a list of all the medicines, herbs, non-prescription drugs, or dietary supplements you use. Also tell them if you smoke, drink alcohol, or use illegal drugs. Some items may interact with your medicine. What should I watch for while using this medication? Your condition will be monitored carefully while you are receiving this medicine. You may need blood work done while you are taking this medicine. This medicine may cause a decrease in vitamin B6. You should make sure that you get enough vitamin B6 while you are taking this medicine. Discuss the foods you eat and the vitamins you take with your health care professional. What side effects may I notice from receiving this medication? Side effects that you should report to your doctor or health care professional as soon as possible: allergic reactions like skin rash, itching or hives, swelling of the face, lips, or tongue seizures signs and symptoms of a blood clot such as breathing problems; changes in vision; chest pain; severe, sudden headache; pain, swelling, warmth in the leg; trouble speaking; sudden numbness or weakness of the face, arm or leg signs and symptoms of a stroke like   changes in vision; confusion; trouble speaking or understanding; severe headaches; sudden numbness or weakness of the face, arm or leg; trouble walking; dizziness; loss of balance or coordination Side effects that usually do not require medical attention (report to your doctor or health care professional if they continue or are  bothersome): chills cough dizziness fever headaches joint pain muscle cramps muscle pain nausea, vomiting pain, redness, or irritation at site where injected This list may not describe all possible side effects. Call your doctor for medical advice about side effects. You may report side effects to FDA at 1-800-FDA-1088. Where should I keep my medication? Keep out of the reach of children. Store in a refrigerator between 2 and 8 degrees C (36 and 46 degrees F). Do not freeze or shake. Throw away any unused portion if using a single-dose vial. Multi-dose vials can be kept in the refrigerator for up to 21 days after the initial dose. Throw away unused medicine. NOTE: This sheet is a summary. It may not cover all possible information. If you have questions about this medicine, talk to your doctor, pharmacist, or health care provider.  2022 Elsevier/Gold Standard (2016-08-23 08:35:19)  

## 2020-08-07 NOTE — Progress Notes (Signed)
[place] [phone]   [phone]  OFFICE PROGRESS NOTE  [name], NP-C [place] [address]  DIAGNOSIS: Anemia of chronic disease secondary to chronic renal insufficiency  PRIOR THERAPY: None  CURRENT THERAPY: Retacrit 40,000 units subcutaneously every 2 weeks in addition to over-the-counter oral iron tablet 1 tablet every day.  INTERVAL HISTORY: Courtney Goodman 75 y.o. female returns to the clinic today for follow-up visit accompanied by her daughter.  The patient is feeling fine today with no concerning complaints except for mild fatigue.  She has been tolerating her Retacrit injection fairly well.  She has not taken her iron tablet for several months.  The patient denied having any current chest pain, shortness of breath, cough or hemoptysis.  She denied having any fever or chills.  She has no nausea, vomiting, diarrhea or constipation.  She denied having any headache or visual changes.  She has no weight loss or night sweats.  She is here today for evaluation and repeat blood work.   MEDICAL HISTORY: Past Medical History:  Diagnosis Date   Anemia of chronic disease    Asthma    Atherosclerotic heart disease of native coronary artery without angina pectoris    CKD (chronic kidney disease)    COPD (chronic obstructive pulmonary disease) ([facility])    DM (diabetes mellitus), type 2 with renal complications ([facility])    Dysrhythmia    Elevated ferritin 01/03/2017   GAD (generalized anxiety disorder)    Hypertension    Mixed hyperlipidemia due to type 2 diabetes mellitus ([facility])    Mixed incontinence    per medical records from [facility]   Osteopenia    Personal history of noncompliance with medical treatment, presenting hazards to health    Shortness of breath dyspnea    Suicidal ideation    TB (tuberculosis), treated    age 64   Vitamin D deficiency     ALLERGIES:  has No Known Allergies.  MEDICATIONS:  Current  Outpatient Medications  Medication Sig Dispense Refill   albuterol (PROVENTIL HFA;VENTOLIN HFA) 108 (90 Base) MCG/ACT inhaler Inhale 2 puffs into the lungs every 6 (six) hours as needed for wheezing or shortness of breath. 1 Inhaler 0   Alcohol Swabs (ALCOHOL PREP) PADS Use for testing (Patient taking differently: 1 each by Other route 2 (two) times daily. E11.9) 100 each 1   amLODipine (NORVASC) 10 MG tablet Take 1 tablet (10 mg total) by mouth daily. 30 tablet 0   amLODipine-benazepril (LOTREL) 5-20 MG capsule Take 1 capsule by mouth every morning.     aspirin 81 MG chewable tablet Chew 81 mg by mouth every morning.     aspirin EC 81 MG tablet Take 81 mg by mouth daily.      Biotin 10000 MCG TABS Take 1 tablet by mouth daily.      Blood Glucose Monitoring Suppl (TRUE METRIX METER) w/Device KIT 1 each by Does not apply route 2 (two) times daily. Use to monitor glucose levels BID; E11.29 (Patient taking differently: 1 each by Does not apply route 2 (two) times daily. E11.29) 1 kit 0   carvedilol (COREG) 6.25 MG tablet Take 1 tablet by mouth twice daily. 180 tablet 0   Cholecalciferol (VITAMIN D3) 25 MCG (1000 UT) CAPS Take 1 capsule by mouth daily.     Cholecalciferol 25 MCG (1000 UT) CHEW SMARTSIG:1 Capsule(s) By Mouth Daily     Cholecalciferol 25 MCG (1000 UT)  tablet Take 1 tablet by mouth daily. 90 tablet 0   citalopram (CELEXA) 20 MG tablet Take 1 tablet (20 mg total) by mouth daily. (Patient taking differently: Take 30 mg by mouth daily.) 90 tablet 1   cyanocobalamin 1000 MCG tablet Take by mouth.     fenofibrate (TRICOR) 145 MG tablet Take 1 tablet by mouth daily. 90 tablet 0   fluticasone (FLONASE) 50 MCG/ACT nasal spray Place 2 sprays into both nostrils daily. 48 g 0   folic acid (FOLVITE) 1 MG tablet Take 1 tablet (1 mg total) by mouth daily. 90 tablet 0   Insulin NPH, Human,, Isophane, (NOVOLIN N FLEXPEN) 100 UNIT/ML Kiwkpen Inject 16 Units into the skin every morning. And pen needles  1/day 15 mL 11   Insulin Pen Needle (PEN NEEDLES) 32G X 5 MM MISC 1 each by Does not apply route daily. 100 each 3   mirtazapine (REMERON) 7.5 MG tablet      omeprazole (PRILOSEC) 40 MG capsule TAKE 1 CAPSULE(40 MG) BY MOUTH DAILY (Patient taking differently: Take 40 mg by mouth daily.) 90 capsule 0   rivastigmine (EXELON) 4.6 mg/24hr Place onto the skin.     simvastatin (ZOCOR) 20 MG tablet Take 1 tablet by mouth every night. 90 tablet 0   traZODone (DESYREL) 50 MG tablet Take 50 mg by mouth at bedtime.      TRUE METRIX BLOOD GLUCOSE TEST test strip TEST TWICE DAILY 150 strip 3   TRUEplus Lancets 33G MISC      VICTOZA 18 MG/3ML SOPN Inject 1.8 mg subcutaneously daily. 27 mL 0   No current facility-administered medications for this visit.    SURGICAL HISTORY:  Past Surgical History:  Procedure Laterality Date   ANKLE RECONSTRUCTION     right   CARPAL TUNNEL RELEASE     CORONARY ANGIOPLASTY      REVIEW OF SYSTEMS:  A comprehensive review of systems was negative except for: Constitutional: positive for fatigue   PHYSICAL EXAMINATION: General appearance: alert, cooperative, fatigued, and no distress Head: Normocephalic, without obvious abnormality, atraumatic Neck: no adenopathy, no JVD, supple, symmetrical, trachea midline, and thyroid not enlarged, symmetric, no tenderness/mass/nodules Lymph nodes: Cervical, supraclavicular, and axillary nodes normal. Resp: clear to auscultation bilaterally Back: symmetric, no curvature. ROM normal. No CVA tenderness. Cardio: regular rate and rhythm, S1, S2 normal, no murmur, click, rub or gallop GI: soft, non-tender; bowel sounds normal; no masses,  no organomegaly Extremities: extremities normal, atraumatic, no cyanosis or edema  ECOG PERFORMANCE STATUS: 1 - Symptomatic but completely ambulatory  Blood pressure 119/67, pulse 74, temperature 98.8 F (37.1 C), temperature source Oral, resp. rate 18, height _0  (1.626 m), weight 141 lb 6.4 oz  (64.1 kg), SpO2 100 %.  LABORATORY DATA: Lab Results  Component Value Date   WBC 6.5 08/07/2020   HGB 10.6 (L) 08/07/2020   HCT 32.7 (L) 08/07/2020   MCV 93.7 08/07/2020   PLT 280 08/07/2020      Chemistry      Component Value Date/Time   NA 137 06/07/2020 1201   NA 137 05/29/2011 1946   K 4.4 06/07/2020 1201   K 3.9 05/29/2011 1946   CL 99 06/07/2020 1201   CL 100 05/29/2011 1946   CO2 19 (L) 06/07/2020 1201   CO2 28 05/29/2011 1946   BUN 36 (H) 06/07/2020 1201   BUN 24 (H) 05/29/2011 1946   CREATININE 2.19 (H) 06/07/2020 1201   CREATININE 1.52 (H) 04/08/2018 1154   CREATININE  1.56 (H) 01/01/2017 0941      Component Value Date/Time   CALCIUM 10.4 (H) 06/07/2020 1201   CALCIUM 9.8 05/29/2011 1946   ALKPHOS 54 06/07/2020 1201   ALKPHOS 86 05/29/2011 1946   AST 19 06/07/2020 1201   AST 14 (L) 04/08/2018 1154   ALT 7 06/07/2020 1201   ALT 13 04/08/2018 1154   ALT 15 05/29/2011 1946   BILITOT 0.3 06/07/2020 1201   BILITOT 0.4 04/08/2018 1154       RADIOGRAPHIC STUDIES: No results found.   ASSESSMENT AND PLAN: This is a very pleasant 75 years old African-American female with anemia of chronic disease secondary to chronic renal insufficiency.  The patient is currently on treatment with Retacrit 40,000 units every 2 weeks and has been tolerating this treatment well.   Repeat CBC today showed hemoglobin of 10.6.  Iron study is normal and ferritin is elevated. I recommended for the patient to continue with her Retacrit injection every 2 weeks.  She was also advised to take oral iron tablet few times a week. I will see her back for follow-up visit in 3 months for evaluation and repeat blood work. She was advised to call immediately if she has any concerning symptoms in the interval. The patient voices understanding of current disease status and treatment options and is in agreement with the current care plan. All questions were answered. The patient knows to call the  clinic with any problems, questions or concerns. We can certainly see the patient much sooner if necessary.   Disclaimer: This note was dictated with voice recognition software. Similar sounding words can inadvertently be transcribed and may not be corrected upon review.

## 2020-08-10 ENCOUNTER — Telehealth: Payer: Self-pay | Admitting: Internal Medicine

## 2020-08-10 NOTE — Telephone Encounter (Signed)
Scheduled per los. Called, not able to leave msg. Mailed printout  

## 2020-08-21 ENCOUNTER — Ambulatory Visit: Payer: Medicare HMO

## 2020-08-24 IMAGING — CT CT HEAD W/O CM
4 series · 16 of 47 positions shown, 18 images · non-contrast
Comparison: Head CT 05/29/2010

CLINICAL DATA: Encephalopathy.  Additional provided: Confusion.

EXAM:
CT HEAD WITHOUT CONTRAST
TECHNIQUE: Contiguous axial images were obtained from the base of the skull
through the vertex without intravenous contrast.

[Series 3: head without sag · sagittal · non-contrast · 0.29mm/px · 3 of 54 slices shown]
[im 18/54  brain]
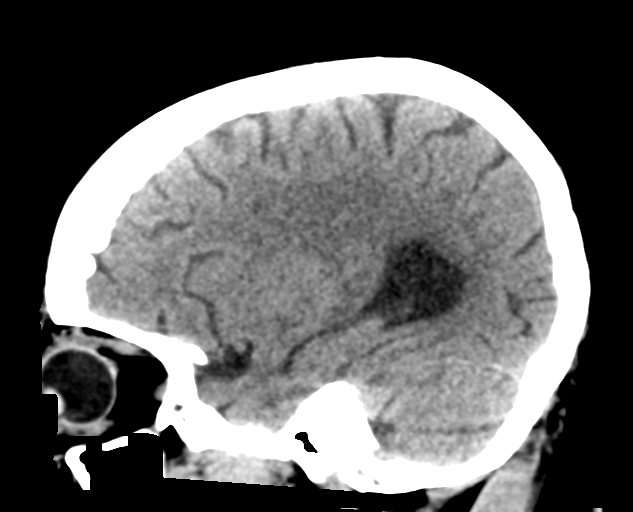
[im 27/54  brain]
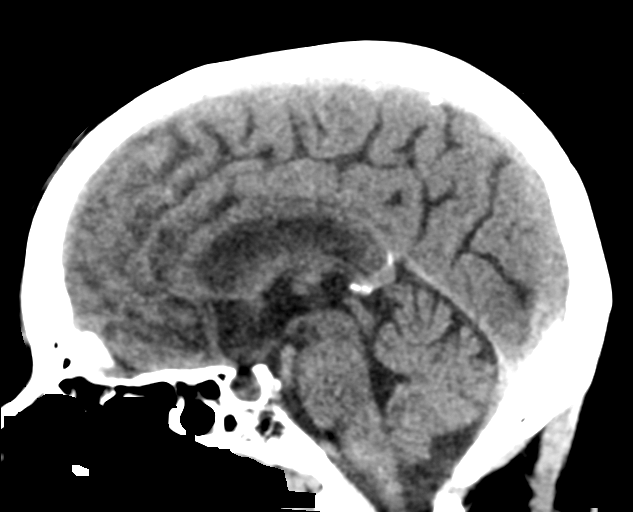
[im 36/54  brain]
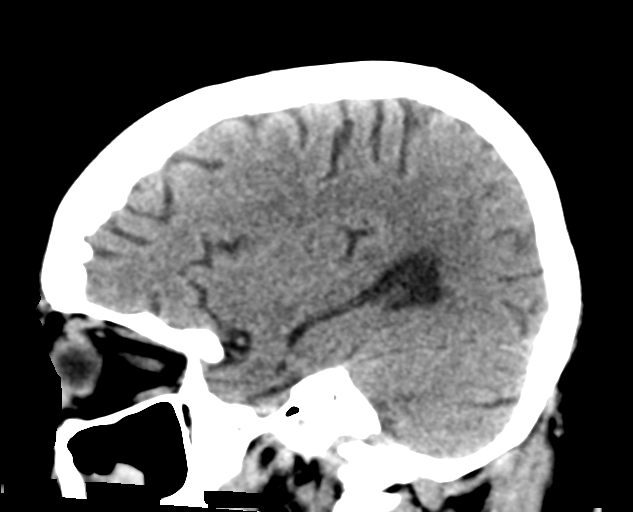

[Series 4: head without · axial · non-contrast · 0.43mm/px · z∈[+662,+772]mm · 7 of 30 slices shown, 9 images]
[im 4/30  brain]
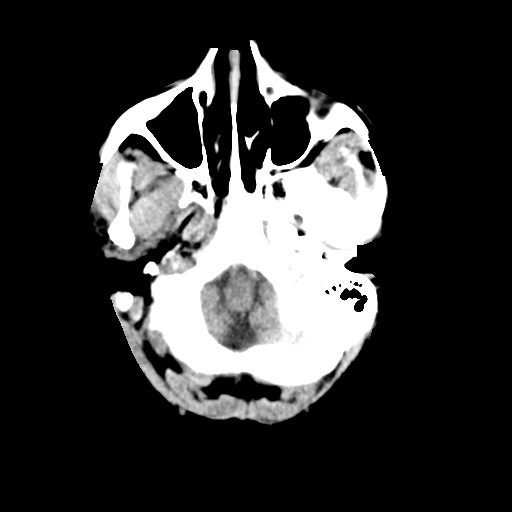
[im 4/30  bone]
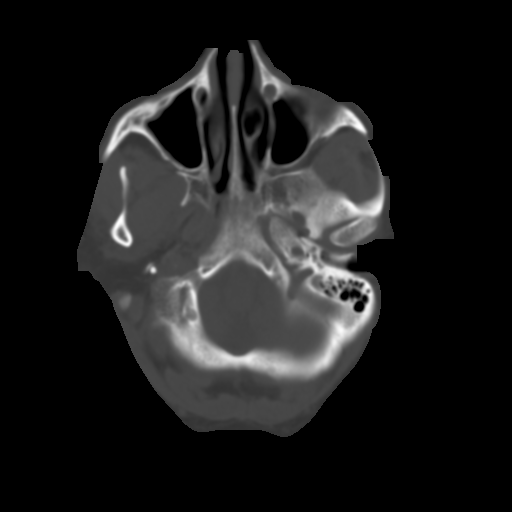
[im 8/30  brain]
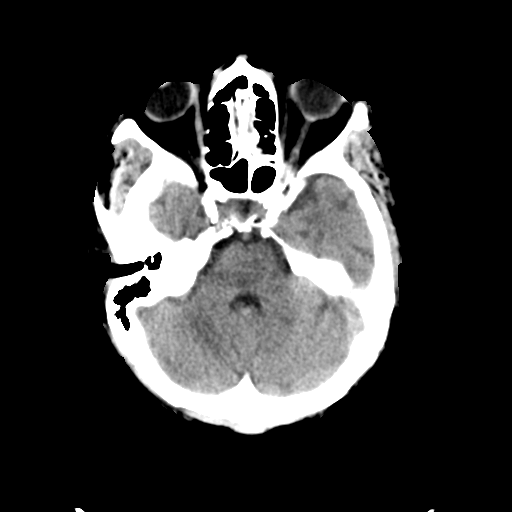
[im 11/30  brain]
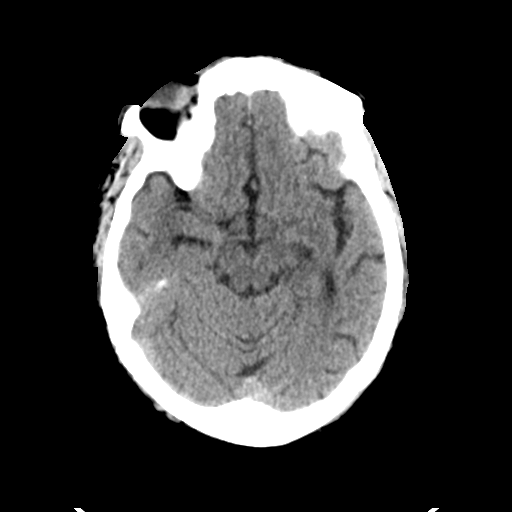
[im 15/30  brain]
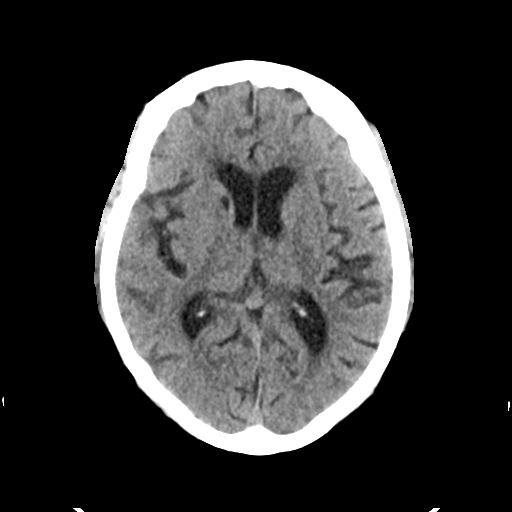
[im 19/30  brain]
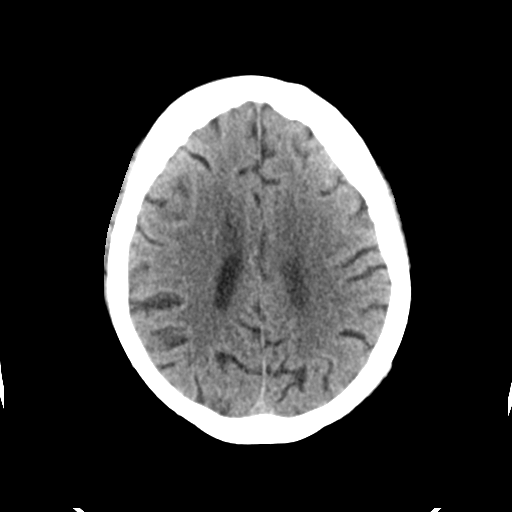
[im 19/30  bone]
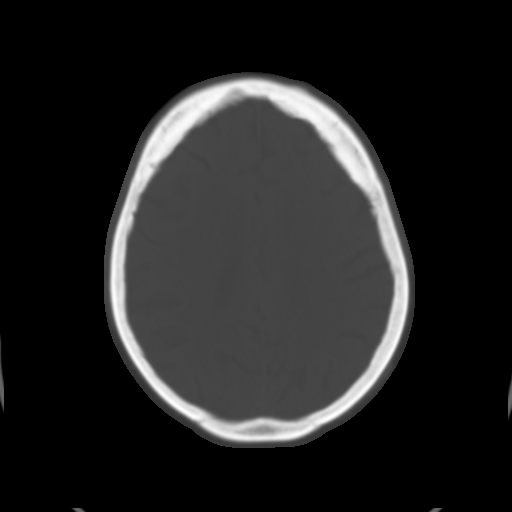
[im 22/30  brain]
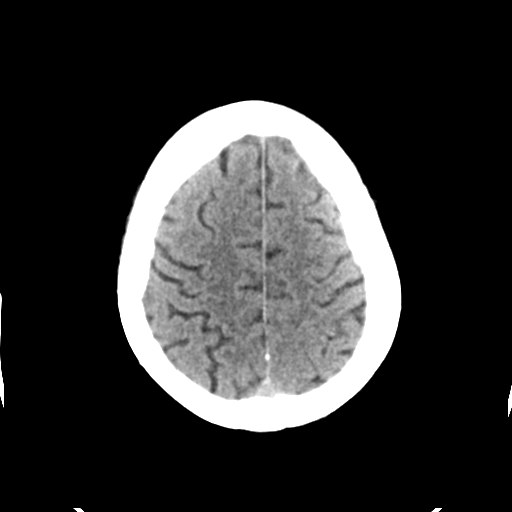
[im 26/30  brain]
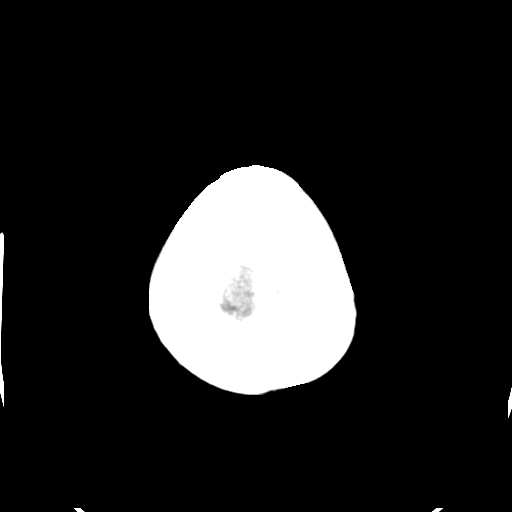

[Series 5: head bone · axial · 0.43mm/px · z∈[+661,+689]mm · 3 of 74 slices shown]
[im 8/74  bone]
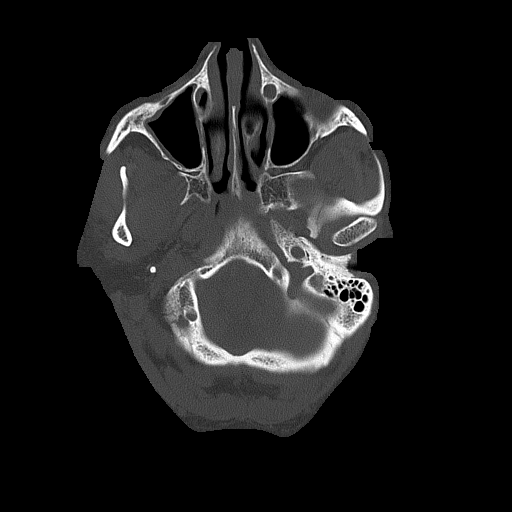
[im 15/74  bone]
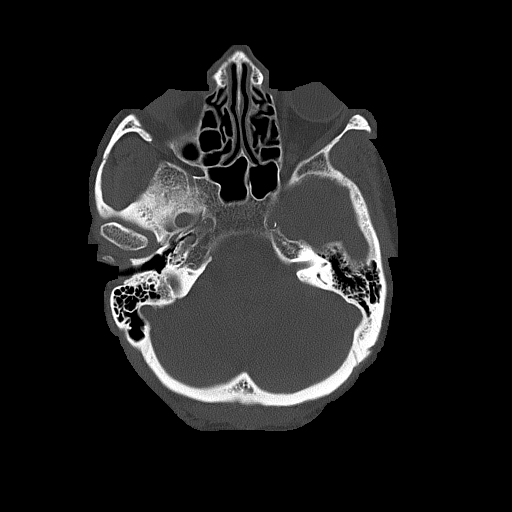
[im 22/74  bone]
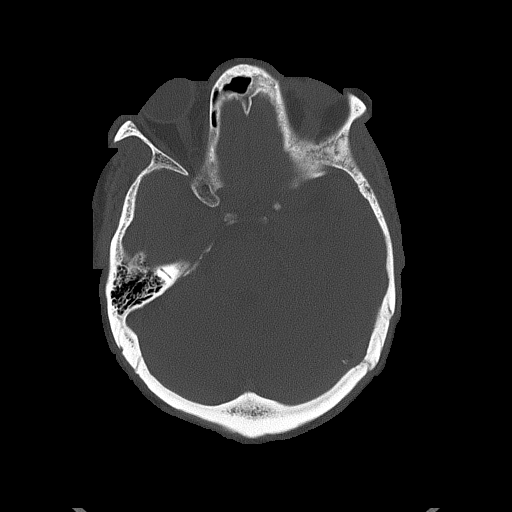

[Series 6: head without cor · coronal · non-contrast · 0.29mm/px · 3 of 67 slices shown]
[im 23/67  brain]
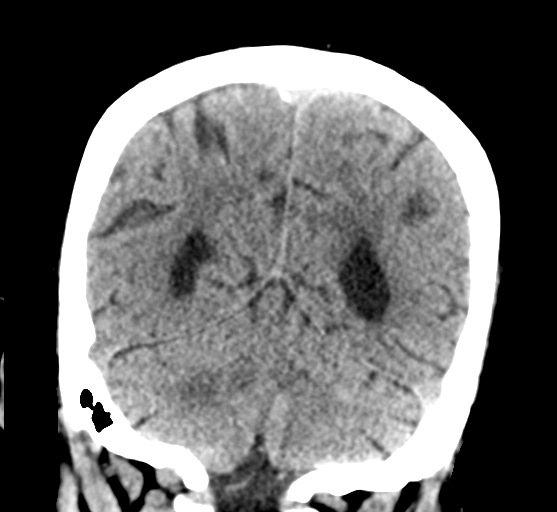
[im 30/67  brain]
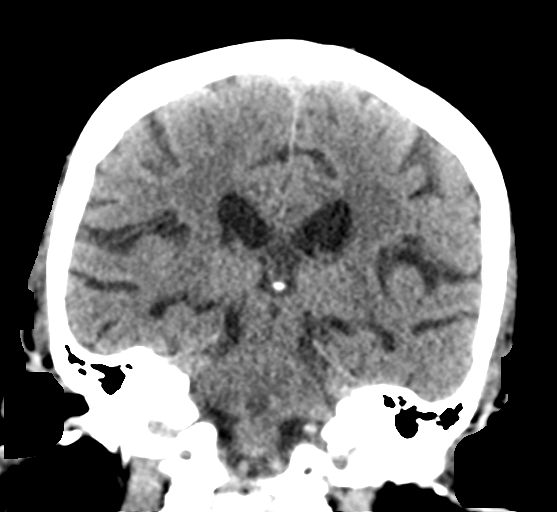
[im 37/67  brain]
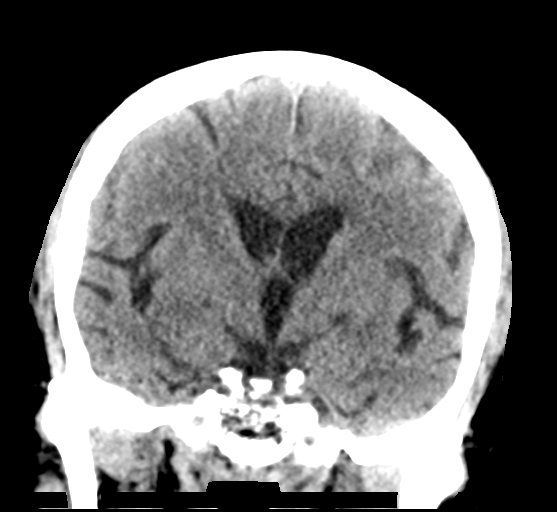

[16 of 47 positions shown; findings below may reference images not displayed]

FINDINGS: Brain:

Mild generalized parenchymal atrophy.

Mild ill-defined hypoattenuation within the cerebral white matter is
nonspecific, but consistent with chronic small vessel ischemic
disease.

Redemonstrated small chronic infarct within the right caudate head.

There is no acute intracranial hemorrhage.

No demarcated cortical infarct is identified.

No extra-axial fluid collection.

No evidence of intracranial mass.

No midline shift.

Vascular: No hyperdense vessel.  Atherosclerotic calcifications

Skull: Normal. Negative for fracture or focal lesion.

Sinuses/Orbits: Visualized orbits show no acute finding. Mild
ethmoid sinus mucosal thickening. Moderate right maxillary sinus
mucosal thickening. No significant mastoid effusion.
IMPRESSION: No CT evidence of acute intracranial abnormality.

Mild generalized parenchymal atrophy and chronic small vessel
ischemic disease.

Redemonstrated small chronic right basal ganglia lacunar infarct.

Paranasal sinus mucosal thickening as described, most notably right
maxillary.

## 2020-08-31 ENCOUNTER — Other Ambulatory Visit: Payer: Self-pay

## 2020-08-31 NOTE — Patient Instructions (Signed)
Goals Addressed             This Visit's Progress    THN-Monitor and Manage My Blood Sugar       Barriers: Health Behaviors Timeframe:  Long-Range Goal Priority:  High Start Date:   03/02/20                          Expected End Date:     01/27/21                Follow Up Date 12/27/20  - check blood sugar at prescribed times - check blood sugar if I feel it is too high or too low - take the blood sugar log to all doctor visits    Why is this important?   Checking your blood sugar at home helps to keep it from getting very high or very low.  Writing the results in a diary or log helps the doctor know how to care for you.  Your blood sugar log should have the time, date and the results.  Also, write down the amount of insulin or other medicine that you take.  Other information, like what you ate, exercise done and how you were feeling, will also be helpful.     Notes: Patient keeps log of blood sugars.  A1c 7.9. 03/02/20 Patient sugars ranging from around 95- to the 200's.  Patient sometimes does not follow diet.   06/01/20 Keep up the great work!! Monitor for signs of hypoglycemia.  08/31/20 Patient A1c up to 8.9. Discussed diabetes and importance of control.  Diabetes Management Discussed: Medication adherence Reviewed importance of limiting carbs such as rice, potatoes, breads, and pastas. Also discussed limiting sweets and sugary drinks.  Discussed importance of portion control.  Also discussed importance of annual exams, foot exams, and eye exams.

## 2020-08-31 NOTE — Patient Outreach (Signed)
[facility]) Care Management  [place]  08/31/2020   Courtney Goodman 04-21-1945 [id number]  Subjective: Telephone call to daughter Courtney Goodman for disease management support. She states patient doing well. Some weight gain and eating better and appears stronger. Discussed diabetes management and importance.  No concerns.    Objective:   Encounter Medications:  Outpatient Encounter Medications as of 08/31/2020  Medication Sig   albuterol (PROVENTIL HFA;VENTOLIN HFA) 108 (90 Base) MCG/ACT inhaler Inhale 2 puffs into the lungs every 6 (six) hours as needed for wheezing or shortness of breath.   Alcohol Swabs (ALCOHOL PREP) PADS Use for testing (Patient taking differently: 1 each by Other route 2 (two) times daily. E11.9)   amLODipine (NORVASC) 10 MG tablet Take 1 tablet (10 mg total) by mouth daily.   amLODipine-benazepril (LOTREL) 5-20 MG capsule Take 1 capsule by mouth every morning.   aspirin 81 MG chewable tablet Chew 81 mg by mouth every morning.   aspirin EC 81 MG tablet Take 81 mg by mouth daily.    Biotin 10000 MCG TABS Take 1 tablet by mouth daily.    Blood Glucose Monitoring Suppl (TRUE METRIX METER) w/Device KIT 1 each by Does not apply route 2 (two) times daily. Use to monitor glucose levels BID; E11.29 (Patient taking differently: 1 each by Does not apply route 2 (two) times daily. E11.29)   carvedilol (COREG) 6.25 MG tablet Take 1 tablet by mouth twice daily.   Cholecalciferol (VITAMIN D3) 25 MCG (1000 UT) CAPS Take 1 capsule by mouth daily.   Cholecalciferol 25 MCG (1000 UT) CHEW SMARTSIG:1 Capsule(s) By Mouth Daily   Cholecalciferol 25 MCG (1000 UT) tablet Take 1 tablet by mouth daily.   citalopram (CELEXA) 20 MG tablet Take 1 tablet (20 mg total) by mouth daily. (Patient taking differently: Take 30 mg by mouth daily.)   cyanocobalamin 1000 MCG tablet Take by mouth.   fenofibrate (TRICOR) 145 MG tablet Take 1 tablet by mouth daily.   fluticasone (FLONASE)  50 MCG/ACT nasal spray Place 2 sprays into both nostrils daily.   folic acid (FOLVITE) 1 MG tablet Take 1 tablet (1 mg total) by mouth daily.   Insulin NPH, Human,, Isophane, (NOVOLIN N FLEXPEN) 100 UNIT/ML Kiwkpen Inject 16 Units into the skin every morning. And pen needles 1/day   Insulin Pen Needle (PEN NEEDLES) 32G X 5 MM MISC 1 each by Does not apply route daily.   mirtazapine (REMERON) 7.5 MG tablet    omeprazole (PRILOSEC) 40 MG capsule TAKE 1 CAPSULE(40 MG) BY MOUTH DAILY (Patient taking differently: Take 40 mg by mouth daily.)   rivastigmine (EXELON) 4.6 mg/24hr Place onto the skin.   simvastatin (ZOCOR) 20 MG tablet Take 1 tablet by mouth every night.   traZODone (DESYREL) 50 MG tablet Take 50 mg by mouth at bedtime.    TRUE METRIX BLOOD GLUCOSE TEST test strip TEST TWICE DAILY   TRUEplus Lancets 33G MISC    VICTOZA 18 MG/3ML SOPN Inject 1.8 mg subcutaneously daily.   No facility-administered encounter medications on file as of 08/31/2020.    Functional Status:  In your present state of health, do you have any difficulty performing the following activities: 06/01/2020 10/07/2019  Hearing? Y Y  Comment - -  Vision? N N  Difficulty concentrating or making decisions? N N  Walking or climbing stairs? N N  Dressing or bathing? Y Y  Comment tiredness tiredness  Doing errands, shopping? [name]  Comment daughter handles daughter  Engineer, building services and eating ? N N  Using the Toilet? N N  In the past six months, have you accidently leaked urine? N N  Do you have problems with loss of bowel control? N N  Managing your Medications? [name]  Comment family helps patient forgetful  Managing your Finances? [name]  Comment family helps family assists  Housekeeping or managing your Housekeeping? [name]  Comment family assists family assists  Some recent data might be hidden    Fall/Depression Screening: Fall Risk  08/31/2020 06/07/2020 03/02/2020  Falls in the past year? 1 1 1   Number falls in past  yr: 1 1 1   Injury with Fall? 1 1 1   Risk Factor Category  - - -  Risk for fall due to : History of fall(s) Impaired balance/gait History of fall(s)  Follow up Falls prevention discussed Falls evaluation completed Falls prevention discussed   PHQ 2/9 Scores 08/31/2020 06/07/2020 06/01/2020 12/30/2019 10/05/2019 10/05/2019 09/29/2019  PHQ - 2 Score - 0 - - 0 0 0  PHQ- 9 Score - 18 - - - - -  Exception Documentation Other- indicate reason in comment box - Other- indicate reason in comment box Other- indicate reason in comment box - - -  Not completed daughter dana does assessment - daughter Courtney Goodman completes assessment daughter Courtney Goodman completed assessment - - -    Assessment:   [place] : Diabetes Type 2 (Adult)  Updates made by [name], RN since 08/31/2020 12:00 AM     Problem: Disease Progression (Diabetes, Type 2)      Long-Range Goal: Disease Progression Prevented or Minimized as evidenced by no hypopglycemic episodes Completed 08/31/2020  Start Date: 11/04/2019  Expected End Date: 01/27/2021  This Visit's Progress: On track  Recent Progress: On track  Priority: High  Note:   Barriers: Knowledge Evidence-based guidance:  Encourage lifestyle changes, such as increased intake of plant-based foods, stress reduction, consistent physical activity, and smoking cessation to prevent  long-term complications and chronic diseases.  Individualize activity and exercise recommendations while considering  potential limitations, such as neuropathy, retinopathy, or the ability to prevent  hyperglycemia or hypoglycemia. Notes:     Task: Monitor and Manage Follow-up for Comorbidities Completed 08/31/2020  Due Date: 01/27/2021  Priority: Routine  Responsible User: [name], RN  Note:   Care Management Activities:    - healthy lifestyle promoted - reduction of sedentary activity encouraged - response to pharmacologic therapy monitored    Notes: 03/02/20 Patient having less of an  appetite.  Recent low blood sugar but corrected with eating.  06/01/20 A1c 8.2.  Recent decrease in insulin due to low sugars.      Problem: Glycemic Management (Diabetes, Type 1)      Long-Range Goal: Glycemic Management Optimized as evidenced by A1c less than 9   Start Date: 08/31/2020  Expected End Date: 01/27/2021  Priority: High  Note:   Evidence-based guidance:  Anticipate A1C testing (point-of-care) every 3 to 6 months based on goal attainment.  Review mutually set A1C goal or target range.  Anticipate screening for autoimmune thyroid disease, vitamin B12 deficiency with anemia and celiac disease based on presenting signs/symptoms.  Anticipate use of insulin with periodic adjustments; consider active involvement of pharmacist.  Provide medical nutrition therapy and development of individualized eating plan.  Compare self-reported symptoms of hypo or hyperglycemia to blood glucose levels, diet and fluid intake, current medications, psychosocial and physiologic stressors, change in activity and  barriers to care adherence.  Promote self-monitoring of blood glucose levels, interval or continuous.  Assess and address barriers to management plan, such as food insecurity, age, developmental ability, depression, anxiety, fear of hypoglycemia or weight gain, as well as medication cost, side effects and complicated regimen.  Assess for disordered eating behaviors based on presenting signs/symptoms and risk factors.  Consider referral to community-based diabetes education program, visiting nurse, community health worker or health coach.  Encourage regular dental care for treatment of periodontal disease; refer to dental provider when needed.   Notes:     Task: Alleviate Barriers to Glycemic Management   Due Date: 01/27/2021  Priority: Routine  Responsible User: [name], RN  Note:   Care Management Activities:    - blood glucose monitoring encouraged - self-awareness of signs/symptoms  of hypo or hyperglycemia encouraged - use of blood glucose monitoring log promoted    Notes: 08/31/20 Patient A1c up to 8.9. Discussed diabetes and importance of control.  Diabetes Management Discussed: Medication adherence Reviewed importance of limiting carbs such as rice, potatoes, breads, and pastas. Also discussed limiting sweets and sugary drinks.  Discussed importance of portion control.  Also discussed importance of annual exams, foot exams, and eye exams.         Goals Addressed             This Visit's Progress    THN-Monitor and Manage My Blood Sugar       Barriers: Health Behaviors Timeframe:  Long-Range Goal Priority:  High Start Date:   03/02/20                          Expected End Date:     01/27/21                Follow Up Date 12/27/20  - check blood sugar at prescribed times - check blood sugar if I feel it is too high or too low - take the blood sugar log to all doctor visits    Why is this important?   Checking your blood sugar at home helps to keep it from getting very high or very low.  Writing the results in a diary or log helps the doctor know how to care for you.  Your blood sugar log should have the time, date and the results.  Also, write down the amount of insulin or other medicine that you take.  Other information, like what you ate, exercise done and how you were feeling, will also be helpful.     Notes: Patient keeps log of blood sugars.  A1c 7.9. 03/02/20 Patient sugars ranging from around 95- to the 200's.  Patient sometimes does not follow diet.   06/01/20 Keep up the great work!! Monitor for signs of hypoglycemia.  08/31/20 Patient A1c up to 8.9. Discussed diabetes and importance of control.  Diabetes Management Discussed: Medication adherence Reviewed importance of limiting carbs such as rice, potatoes, breads, and pastas. Also discussed limiting sweets and sugary drinks.  Discussed importance of portion control.  Also discussed importance of  annual exams, foot exams, and eye exams.            Plan:  Follow-up: Patient agrees to Care Plan and Follow-up. Follow-up in 3-4 month(s)  Jone Baseman, RN, MSN [facility] Care Management Coordinator Direct Line [phone] Cell [phone] Toll Free: [phone]  Fax: [phone]

## 2020-09-04 ENCOUNTER — Ambulatory Visit: Payer: Medicare HMO

## 2020-09-07 ENCOUNTER — Emergency Department (HOSPITAL_COMMUNITY): Payer: Medicare HMO

## 2020-09-07 ENCOUNTER — Other Ambulatory Visit: Payer: Self-pay

## 2020-09-07 ENCOUNTER — Emergency Department (HOSPITAL_COMMUNITY)
Admission: EM | Admit: 2020-09-07 | Discharge: 2020-09-07 | Disposition: A | Discharge status: Home / Self Care | Payer: Medicare HMO | Attending: Emergency Medicine | Admitting: Emergency Medicine

## 2020-09-07 DIAGNOSIS — J449 Chronic obstructive pulmonary disease, unspecified: Secondary | ICD-10-CM | POA: Insufficient documentation

## 2020-09-07 DIAGNOSIS — E1122 Type 2 diabetes mellitus with diabetic chronic kidney disease: Secondary | ICD-10-CM | POA: Diagnosis not present

## 2020-09-07 DIAGNOSIS — R739 Hyperglycemia, unspecified: Secondary | ICD-10-CM | POA: Diagnosis not present

## 2020-09-07 DIAGNOSIS — I129 Hypertensive chronic kidney disease with stage 1 through stage 4 chronic kidney disease, or unspecified chronic kidney disease: Secondary | ICD-10-CM | POA: Insufficient documentation

## 2020-09-07 DIAGNOSIS — E1165 Type 2 diabetes mellitus with hyperglycemia: Secondary | ICD-10-CM | POA: Insufficient documentation

## 2020-09-07 DIAGNOSIS — I2510 Atherosclerotic heart disease of native coronary artery without angina pectoris: Secondary | ICD-10-CM | POA: Insufficient documentation

## 2020-09-07 DIAGNOSIS — I10 Essential (primary) hypertension: Secondary | ICD-10-CM | POA: Diagnosis not present

## 2020-09-07 DIAGNOSIS — Z7982 Long term (current) use of aspirin: Secondary | ICD-10-CM | POA: Diagnosis not present

## 2020-09-07 DIAGNOSIS — F0390 Unspecified dementia without behavioral disturbance: Secondary | ICD-10-CM

## 2020-09-07 DIAGNOSIS — I4510 Unspecified right bundle-branch block: Secondary | ICD-10-CM | POA: Diagnosis not present

## 2020-09-07 DIAGNOSIS — R410 Disorientation, unspecified: Secondary | ICD-10-CM

## 2020-09-07 DIAGNOSIS — Z794 Long term (current) use of insulin: Secondary | ICD-10-CM | POA: Diagnosis not present

## 2020-09-07 DIAGNOSIS — N183 Chronic kidney disease, stage 3 unspecified: Secondary | ICD-10-CM | POA: Insufficient documentation

## 2020-09-07 DIAGNOSIS — Z79899 Other long term (current) drug therapy: Secondary | ICD-10-CM | POA: Insufficient documentation

## 2020-09-07 DIAGNOSIS — Z043 Encounter for examination and observation following other accident: Secondary | ICD-10-CM | POA: Diagnosis not present

## 2020-09-07 DIAGNOSIS — Z20822 Contact with and (suspected) exposure to covid-19: Secondary | ICD-10-CM | POA: Insufficient documentation

## 2020-09-07 DIAGNOSIS — J45909 Unspecified asthma, uncomplicated: Secondary | ICD-10-CM | POA: Diagnosis not present

## 2020-09-07 DIAGNOSIS — R42 Dizziness and giddiness: Secondary | ICD-10-CM | POA: Diagnosis not present

## 2020-09-07 DIAGNOSIS — Z7951 Long term (current) use of inhaled steroids: Secondary | ICD-10-CM | POA: Insufficient documentation

## 2020-09-07 DIAGNOSIS — Z87891 Personal history of nicotine dependence: Secondary | ICD-10-CM | POA: Diagnosis not present

## 2020-09-07 DIAGNOSIS — R4182 Altered mental status, unspecified: Secondary | ICD-10-CM | POA: Diagnosis not present

## 2020-09-07 LAB — COMPREHENSIVE METABOLIC PANEL
ALT: 13 U/L (ref 0–44)
AST: 21 U/L (ref 15–41)
Albumin: 3.5 g/dL (ref 3.5–5.0)
Alkaline Phosphatase: 49 U/L (ref 38–126)
Anion gap: 9 (ref 5–15)
BUN: 46 mg/dL — ABNORMAL HIGH (ref 8–23)
CO2: 26 mmol/L (ref 22–32)
Calcium: 10 mg/dL (ref 8.9–10.3)
Chloride: 94 mmol/L — ABNORMAL LOW (ref 98–111)
Creatinine, Ser: 2.43 mg/dL — ABNORMAL HIGH (ref 0.44–1.00)
GFR, Estimated: 20 mL/min — ABNORMAL LOW (ref >60)
Glucose, Bld: 524 mg/dL (ref 70–99)
Potassium: 4.1 mmol/L (ref 3.5–5.1)
Sodium: 129 mmol/L — ABNORMAL LOW (ref 135–145)
Total Bilirubin: 0.3 mg/dL (ref 0.3–1.2)
Total Protein: 7.3 g/dL (ref 6.5–8.1)

## 2020-09-07 LAB — CBC WITH DIFFERENTIAL/PLATELET
Abs Immature Granulocytes: 0.02 10*3/uL (ref 0.00–0.07)
Basophils Absolute: 0 10*3/uL (ref 0.0–0.1)
Basophils Relative: 1 %
Eosinophils Absolute: 0.1 10*3/uL (ref 0.0–0.5)
Eosinophils Relative: 1 %
HCT: 33.8 % — ABNORMAL LOW (ref 36.0–46.0)
Hemoglobin: 11.2 g/dL — ABNORMAL LOW (ref 12.0–15.0)
Immature Granulocytes: 0 %
Lymphocytes Relative: 28 %
Lymphs Abs: 2 10*3/uL (ref 0.7–4.0)
MCH: 30.8 pg (ref 26.0–34.0)
MCHC: 33.1 g/dL (ref 30.0–36.0)
MCV: 92.9 fL (ref 80.0–100.0)
Monocytes Absolute: 0.5 10*3/uL (ref 0.1–1.0)
Monocytes Relative: 7 %
Neutro Abs: 4.5 10*3/uL (ref 1.7–7.7)
Neutrophils Relative %: 63 %
Platelets: 278 10*3/uL (ref 150–400)
RBC: 3.64 MIL/uL — ABNORMAL LOW (ref 3.87–5.11)
RDW: 15.2 % (ref 11.5–15.5)
WBC: 7.2 10*3/uL (ref 4.0–10.5)
nRBC: 0 % (ref 0.0–0.2)

## 2020-09-07 LAB — RESP PANEL BY RT-PCR (FLU A&B, COVID) ARPGX2
Influenza A by PCR: NEGATIVE
Influenza B by PCR: NEGATIVE
SARS Coronavirus 2 by RT PCR: NEGATIVE

## 2020-09-07 LAB — CBG MONITORING, ED
Glucose-Capillary: 517 mg/dL (ref 70–99)
Glucose-Capillary: 496 mg/dL — ABNORMAL HIGH (ref 70–99)
Glucose-Capillary: 370 mg/dL — ABNORMAL HIGH (ref 70–99)

## 2020-09-07 LAB — URINALYSIS, ROUTINE W REFLEX MICROSCOPIC
Bacteria, UA: NONE SEEN
Bilirubin Urine: NEGATIVE
Glucose, UA: >=500 mg/dL — AB
Hgb urine dipstick: NEGATIVE
Ketones, ur: NEGATIVE mg/dL
Leukocytes,Ua: NEGATIVE
Nitrite: NEGATIVE
Protein, ur: NEGATIVE mg/dL
Specific Gravity, Urine: 1.015 (ref 1.005–1.030)
pH: 5 (ref 5.0–8.0)

## 2020-09-07 LAB — POCT I-STAT EG7
Acid-Base Excess: 2 mmol/L (ref 0.0–2.0)
Bicarbonate: 27.8 mmol/L (ref 20.0–28.0)
Calcium, Ion: 1.3 mmol/L (ref 1.15–1.40)
HCT: 35 % — ABNORMAL LOW (ref 36.0–46.0)
Hemoglobin: 11.9 g/dL — ABNORMAL LOW (ref 12.0–15.0)
O2 Saturation: 64 %
Potassium: 4.2 mmol/L (ref 3.5–5.1)
Sodium: 131 mmol/L — ABNORMAL LOW (ref 135–145)
TCO2: 29 mmol/L (ref 22–32)
pCO2, Ven: 49.4 mmHg (ref 44.0–60.0)
pH, Ven: 7.358 (ref 7.250–7.430)
pO2, Ven: 35 mmHg (ref 32.0–45.0)

## 2020-09-07 MED ORDER — INSULIN ASPART 100 UNIT/ML IJ SOLN
10.0000 [IU] | Freq: Once | INTRAMUSCULAR | Status: AC
  Administered 2020-09-07: 10 [IU] via SUBCUTANEOUS

## 2020-09-07 MED ORDER — SODIUM CHLORIDE 0.9 % IV SOLN: INTRAVENOUS | Status: DC

## 2020-09-07 MED ORDER — SODIUM CHLORIDE 0.9 % IV BOLUS: 1000.0000 mL | Freq: Once | INTRAVENOUS | Status: DC

## 2020-09-07 NOTE — ED Notes (Signed)
ED Provider at bedside talking with patient and family member

## 2020-09-07 NOTE — ED Notes (Signed)
IV team at bedside, unable to place IV x 4 attempts even with [place]. Provider notified.

## 2020-09-07 NOTE — ED Provider Notes (Addendum)
[facility] EMERGENCY DEPARTMENT Provider Note   CSN: [id number] Arrival date & time: 09/07/20  0221     History Chief Complaint  Patient presents with   Altered Mental Status    Courtney Goodman is a 75 y.o. female.  Patient brought in by EMS lives at home alone.  With patient's grandson is here with her.  Patient known to have diabetes.  Type II treated with insulin.  Patient has some degree of confusion.  Not clear whether she is getting her insulin appropriately.  EMS noted that her blood sugar was 576.  According to family dementia is baseline.  Patient denies any pain.  Denies any nausea or vomiting.  Patient does state that she is thirsty.  Past medical history significant for the type 2 diabetes COPD chronic kidney disease anemia of chronic disease hypertension.      Past Medical History:  Diagnosis Date   Anemia of chronic disease    Asthma    Atherosclerotic heart disease of native coronary artery without angina pectoris    CKD (chronic kidney disease)    COPD (chronic obstructive pulmonary disease) (HCC)    DM (diabetes mellitus), type 2 with renal complications ([place])    Dysrhythmia    Elevated ferritin 01/03/2017   GAD (generalized anxiety disorder)    Hypertension    Mixed hyperlipidemia due to type 2 diabetes mellitus ([place])    Mixed incontinence    per medical records from [facility]   Osteopenia    Personal history of noncompliance with medical treatment, presenting hazards to health    Shortness of breath dyspnea    Suicidal ideation    TB (tuberculosis), treated    age 30   Vitamin D deficiency     Patient Active Problem List   Diagnosis Date Noted   Hypercalcemia 06/18/2020   Multiple comorbid conditions 06/18/2020   AMS (altered mental status) 06/16/2020   Hyponatremia 06/16/2020   Poor appetite 09/29/2019   Assistance needed with transportation 09/29/2019   Medication noncompliance due to cognitive impairment 10/12/2018   Severe  episode of recurrent major depressive disorder, without psychotic features ([place]) 10/12/2018   Acute renal failure superimposed on stage 3 chronic kidney disease ([place]) 09/22/2018   Leukocytosis 09/22/2018   Non-intractable vomiting    GERD (gastroesophageal reflux disease) 08/23/2017   CAD (coronary artery disease) 08/23/2017   Hypoglycemia 27/61/4709   Acute metabolic encephalopathy 29/57/4734   Hypokalemia 08/23/2017   Type II diabetes mellitus with renal manifestations ([place]) 08/23/2017   Elevated ferritin 01/03/2017   Personal history of noncompliance with medical treatment, presenting hazards to health    Urinary incontinence, mixed 05/27/2016   Major depression, recurrent ([place]) 05/27/2016   Atherosclerotic heart disease of native coronary artery without angina pectoris 05/27/2016   GAD (generalized anxiety disorder)    Anemia of chronic disease    Mixed hyperlipidemia due to type 2 diabetes mellitus ([place])    Diabetes ([place]) 11/30/2015   Vitamin D deficiency 11/23/2015   HTN (hypertension) 11/23/2015   Anemia 11/23/2015   CKD (chronic kidney disease), stage III ([place]) 11/23/2015   Dysrhythmia    COPD (chronic obstructive pulmonary disease) ([place])     Past Surgical History:  Procedure Laterality Date   ANKLE RECONSTRUCTION     right   CARPAL TUNNEL RELEASE     CORONARY ANGIOPLASTY       OB History   No obstetric history on file.     Family History  Problem Relation Age  of Onset   Diabetes Mother     Social History   Tobacco Use   Smoking status: Former   Smokeless tobacco: Never  Scientific laboratory technician Use: Never used  Substance Use Topics   Alcohol use: No   Drug use: No    Home Medications Prior to Admission medications   Medication Sig Start Date End Date Taking? Authorizing Provider  albuterol (PROVENTIL HFA;VENTOLIN HFA) 108 (90 Base) MCG/ACT inhaler Inhale 2 puffs into the lungs every 6 (six) hours as needed for wheezing or shortness of breath. 04/14/17    Henson, Vickie L, NP-C  Alcohol Swabs (ALCOHOL PREP) PADS Use for testing Patient taking differently: 1 each by Other route 2 (two) times daily. E11.9 08/07/18   Henson, Vickie L, NP-C  amLODipine (NORVASC) 10 MG tablet Take 1 tablet (10 mg total) by mouth daily. 06/05/20   Noemi Chapel, MD  amLODipine-benazepril (LOTREL) 5-20 MG capsule Take 1 capsule by mouth every morning. 05/01/20   [provider]  aspirin 81 MG chewable tablet Chew 81 mg by mouth every morning. 05/01/20   [provider]  aspirin EC 81 MG tablet Take 81 mg by mouth daily.     [provider]  Biotin 10000 MCG TABS Take 1 tablet by mouth daily.     [provider]  Blood Glucose Monitoring Suppl (TRUE METRIX METER) w/Device KIT 1 each by Does not apply route 2 (two) times daily. Use to monitor glucose levels BID; E11.29 Patient taking differently: 1 each by Does not apply route 2 (two) times daily. E11.29 10/16/18   Renato Shin, MD  carvedilol (COREG) 6.25 MG tablet Take 1 tablet by mouth twice daily. 09/06/19   Henson, Vickie L, NP-C  Cholecalciferol (VITAMIN D3) 25 MCG (1000 UT) CAPS Take 1 capsule by mouth daily. 05/01/20   [provider]  Cholecalciferol 25 MCG (1000 UT) CHEW SMARTSIG:1 Capsule(s) By Mouth Daily 02/04/20   [provider]  Cholecalciferol 25 MCG (1000 UT) tablet Take 1 tablet by mouth daily. 08/11/19   Henson, Vickie L, NP-C  citalopram (CELEXA) 20 MG tablet Take 1 tablet (20 mg total) by mouth daily. Patient taking differently: Take 30 mg by mouth daily. 09/05/16   [name] L, NP-C  cyanocobalamin 1000 MCG tablet Take by mouth. 06/19/20 09/17/20  [provider]  fenofibrate (TRICOR) 145 MG tablet Take 1 tablet by mouth daily. 09/06/19   Henson, Vickie L, NP-C  fluticasone (FLONASE) 50 MCG/ACT nasal spray Place 2 sprays into both nostrils daily. 07/25/16   Henson, Vickie L, NP-C  folic acid (FOLVITE) 1 MG tablet Take 1 tablet (1 mg total) by mouth daily.  08/09/19   Henson, Vickie L, NP-C  Insulin NPH, Human,, Isophane, (NOVOLIN N FLEXPEN) 100 UNIT/ML Kiwkpen Inject 16 Units into the skin every morning. And pen needles 1/day 06/20/20   Renato Shin, MD  Insulin Pen Needle (PEN NEEDLES) 32G X 5 MM MISC 1 each by Does not apply route daily. 06/21/20   Renato Shin, MD  mirtazapine (REMERON) 7.5 MG tablet  03/02/20   [provider]  omeprazole (PRILOSEC) 40 MG capsule TAKE 1 CAPSULE(40 MG) BY MOUTH DAILY Patient taking differently: Take 40 mg by mouth daily. 12/13/19   Henson, Vickie L, NP-C  rivastigmine (EXELON) 4.6 mg/24hr Place onto the skin. 06/19/20 09/17/20  [provider]  simvastatin (ZOCOR) 20 MG tablet Take 1 tablet by mouth every night. 09/06/19   Henson, Vickie L, NP-C  traZODone (  DESYREL) 50 MG tablet Take 50 mg by mouth at bedtime.     [provider]  TRUE METRIX BLOOD GLUCOSE TEST test strip TEST TWICE DAILY 03/12/20   Renato Shin, MD  TRUEplus Lancets 33G MISC  05/30/20   [provider]  VICTOZA 18 MG/3ML SOPN Inject 1.8 mg subcutaneously daily. 08/12/19   Renato Shin, MD    Allergies    Patient has no known allergies.  Review of Systems   Review of Systems  Unable to perform ROS: Dementia   Physical Exam Updated Vital Signs BP (!) 180/87 (BP Location: Left Arm)   Pulse 64   Temp (!) 97.4 F (36.3 C) (Oral)   Resp 18   SpO2 98%   Physical Exam Vitals and nursing note reviewed.  Constitutional:      General: She is not in acute distress.    Appearance: Normal appearance. She is well-developed.  HENT:     Head: Normocephalic and atraumatic.     Mouth/Throat:     Mouth: Mucous membranes are dry.  Eyes:     Extraocular Movements: Extraocular movements intact.     Conjunctiva/sclera: Conjunctivae normal.     Pupils: Pupils are equal, round, and reactive to light.  Cardiovascular:     Rate and Rhythm: Normal rate and regular rhythm.     Heart sounds: No murmur heard. Pulmonary:      Effort: Pulmonary effort is normal. No respiratory distress.     Breath sounds: Normal breath sounds.  Abdominal:     Palpations: Abdomen is soft.     Tenderness: There is no abdominal tenderness.  Musculoskeletal:     Cervical back: Neck supple.  Skin:    General: Skin is warm and dry.  Neurological:     General: No focal deficit present.     Mental Status: She is alert. Mental status is at baseline.     Cranial Nerves: No cranial nerve deficit.     Sensory: No sensory deficit.    ED Results / Procedures / Treatments   Labs (all labs ordered are listed, but only abnormal results are displayed) Labs Reviewed  CBC WITH DIFFERENTIAL/PLATELET - Abnormal; Notable for the following components:      Result Value   RBC 3.64 (*)    Hemoglobin 11.2 (*)    HCT 33.8 (*)    All other components within normal limits  COMPREHENSIVE METABOLIC PANEL - Abnormal; Notable for the following components:   Sodium 129 (*)    Chloride 94 (*)    Glucose, Bld 524 (*)    BUN 46 (*)    Creatinine, Ser 2.43 (*)    GFR, Estimated 20 (*)    All other components within normal limits  URINALYSIS, ROUTINE W REFLEX MICROSCOPIC - Abnormal; Notable for the following components:   Color, Urine STRAW (*)    Glucose, UA >=500 (*)    All other components within normal limits  CBG MONITORING, ED - Abnormal; Notable for the following components:   Glucose-Capillary 517 (*)    All other components within normal limits  POCT I-STAT EG7 - Abnormal; Notable for the following components:   Sodium 131 (*)    HCT 35.0 (*)    Hemoglobin 11.9 (*)    All other components within normal limits  CBG MONITORING, ED - Abnormal; Notable for the following components:   Glucose-Capillary 496 (*)    All other components within normal limits  RESP PANEL BY RT-PCR (FLU A&B, COVID) [id number]  I-STAT VENOUS BLOOD GAS, ED    EKG None  Radiology DG Chest 2 View  Result Date: 09/07/2020 CLINICAL DATA:  Fall, dementia EXAM:  CHEST - 2 VIEW COMPARISON:  06/05/2020 FINDINGS: Mild left basilar scarring. Right lung is clear. No pleural effusion or pneumothorax. The heart is normal in size. Visualized osseous structures are within normal limits. IMPRESSION: No evidence of acute cardiopulmonary disease. Electronically Signed   By: Julian Hy M.D.   On: 09/07/2020 03:02   CT Head Wo Contrast  Result Date: 09/07/2020 CLINICAL DATA:  Mental status change EXAM: CT HEAD WITHOUT CONTRAST TECHNIQUE: Contiguous axial images were obtained from the base of the skull through the vertex without intravenous contrast. COMPARISON:  09/07/2020 1:53 a.m. and 06/05/2020 FINDINGS: Brain: No evidence of acute infarction, hemorrhage, hydrocephalus, extra-axial collection or mass lesion/mass effect. Unchanged cerebral and cerebellar atrophy. Unchanged right basal ganglia lacunar infarcts, with a left basal ganglia lacunar infarct that is unchanged compared to the same day exam but has increased in size compared to 06/05/2020. Hypodensity in the periventricular white matter, likely sequela of chronic small vessel ischemic disease. Vascular: No hyperdense vessel or unexpected calcification. Skull: Normal. Negative for fracture or focal lesion. Sinuses/Orbits: Mucosal thickening in the right maxillary sinus. Orbits are unremarkable. Other: None. IMPRESSION: No acute intracranial process. Redemonstrated chronic right basal ganglia lacunar infarct, with progression of a left basal ganglial lacunar infarct compared to 06/05/2020. Electronically Signed   By: Merilyn Baba MD   On: 09/07/2020 08:29    Procedures Procedures   Medications Ordered in ED Medications  0.9 %  sodium chloride infusion (has no administration in time range)  sodium chloride 0.9 % bolus 1,000 mL (has no administration in time range)  insulin aspart (novoLOG) injection 10 Units (has no administration in time range)    ED Course  I have reviewed the triage vital signs and the  nursing notes.  Pertinent labs & imaging results that were available during my care of the patient were reviewed by me and considered in my medical decision making (see chart for details).    MDM Rules/Calculators/A&P                           Patient's blood sugar is elevated here.  But not in DKA.  Attempted to get IV.  But as time is gone on probably does not need it.  We will give subcu insulin.  We will go ahead and hydrate her orally which patient is able to do.  Recheck blood sugar long this is improving patient would be stable for discharge home.  Patient may need assistance with her medications at home.  We will put in a social work consult.  Patient already has a home health helper coming out every day of the week.  Patient's daughters are going to move into assist.  Currently living with her grandson.  Patient's blood sugars improved here with some subcu insulin and patient taking p.o.  They were never able to establish an IV.  But I think we are able to work around it. Final Clinical Impression(s) / ED Diagnoses Final diagnoses:  Hyperglycemia  Dementia without behavioral disturbance, unspecified dementia type [facility])    Rx / DC Orders ED Discharge Orders     None        Fredia Sorrow, MD 09/07/20 1047    Fredia Sorrow, MD 09/07/20 1515

## 2020-09-07 NOTE — ED Provider Notes (Signed)
Emergency Medicine Provider Triage Evaluation Note  Courtney Goodman , a 75 y.o. female  was evaluated in triage.  Pt complains of feeling unwell. Coming from home. Hx of dementia. Was found wandering around. Patient presently with c/o abdominal pain and nausea. No vomiting. EMS reports elevated CBG. No reported falls or trauma..  Review of Systems  Positive: Abdominal pain, nausea Negative: Vomiting   Physical Exam  There were no vitals taken for this visit. Gen:   Awake, no distress   Resp:  Normal effort  MSK:   Moves extremities without difficulty  Other:  Generalized abdominal TTP without peritoneal signs. Nontoxic appearing. Answers questions appropriately and follows commands.  Medical Decision Making  Medically screening exam initiated at 1:13 AM.  Appropriate orders placed.  Courtney Goodman was informed that the remainder of the evaluation will be completed by another provider, this initial triage assessment does not replace that evaluation, and the importance of remaining in the ED until their evaluation is complete.  Hyperglycemia, abdominal pain   Antonietta Breach, Hershal Coria 09/07/20 8719    Quintella Reichert, MD 09/07/20 [phone]

## 2020-09-07 NOTE — ED Triage Notes (Signed)
Brought in by [facility] EMS from home - pt lives alone - pt unsure if she has been taking her insulin correctly and also reports she fell yesterday.   Pt's baseline dementia.  BGL with EMS [phone]

## 2020-09-07 NOTE — ED Notes (Signed)
Unable to obtain IV x 2 attempts. IV team consulted.

## 2020-09-07 NOTE — Discharge Instructions (Addendum)
Important to take your diabetic medicines as directed.  Sounds like family is going to help you out.  Make an appointment follow-up with your doctor.  Return for any new or worse symptoms.

## 2020-09-07 NOTE — ED Notes (Signed)
ED Provider at bedside. 

## 2020-09-07 NOTE — ED Notes (Signed)
Pt requested to call daughters and let them know that she's at the hospital. Both contact numbers called, first did not answer and alternate was the wrong number.

## 2020-09-07 NOTE — ED Notes (Signed)
Patient drinking water 

## 2020-09-07 NOTE — ED Notes (Signed)
A/A/oriented to place and time.

## 2020-09-12 ENCOUNTER — Other Ambulatory Visit: Payer: Self-pay

## 2020-09-12 ENCOUNTER — Observation Stay (HOSPITAL_COMMUNITY)
Admission: EM | Admit: 2020-09-12 | Discharge: 2020-09-13 | Disposition: A | Discharge status: Home / Self Care | Payer: Medicare HMO | Attending: Family Medicine | Admitting: Family Medicine

## 2020-09-12 ENCOUNTER — Ambulatory Visit (INDEPENDENT_AMBULATORY_CARE_PROVIDER_SITE_OTHER): Payer: Medicare HMO | Admitting: Endocrinology

## 2020-09-12 ENCOUNTER — Emergency Department (HOSPITAL_COMMUNITY): Payer: Medicare HMO

## 2020-09-12 ENCOUNTER — Telehealth: Payer: Self-pay | Admitting: Endocrinology

## 2020-09-12 ENCOUNTER — Encounter (HOSPITAL_COMMUNITY): Payer: Self-pay | Admitting: Internal Medicine

## 2020-09-12 ENCOUNTER — Ambulatory Visit: Payer: Medicare HMO | Admitting: Family Medicine

## 2020-09-12 VITALS — BP 140/62 | HR 68 | Ht 64.0 in | Wt 132.4 lb

## 2020-09-12 DIAGNOSIS — J449 Chronic obstructive pulmonary disease, unspecified: Secondary | ICD-10-CM | POA: Insufficient documentation

## 2020-09-12 DIAGNOSIS — E1122 Type 2 diabetes mellitus with diabetic chronic kidney disease: Secondary | ICD-10-CM | POA: Insufficient documentation

## 2020-09-12 DIAGNOSIS — N183 Chronic kidney disease, stage 3 unspecified: Secondary | ICD-10-CM

## 2020-09-12 DIAGNOSIS — E1165 Type 2 diabetes mellitus with hyperglycemia: Secondary | ICD-10-CM

## 2020-09-12 DIAGNOSIS — I129 Hypertensive chronic kidney disease with stage 1 through stage 4 chronic kidney disease, or unspecified chronic kidney disease: Secondary | ICD-10-CM | POA: Insufficient documentation

## 2020-09-12 DIAGNOSIS — G4489 Other headache syndrome: Secondary | ICD-10-CM | POA: Diagnosis not present

## 2020-09-12 DIAGNOSIS — M47812 Spondylosis without myelopathy or radiculopathy, cervical region: Secondary | ICD-10-CM | POA: Diagnosis not present

## 2020-09-12 DIAGNOSIS — J45909 Unspecified asthma, uncomplicated: Secondary | ICD-10-CM | POA: Insufficient documentation

## 2020-09-12 DIAGNOSIS — I10 Essential (primary) hypertension: Secondary | ICD-10-CM | POA: Diagnosis not present

## 2020-09-12 DIAGNOSIS — Z20822 Contact with and (suspected) exposure to covid-19: Secondary | ICD-10-CM | POA: Insufficient documentation

## 2020-09-12 DIAGNOSIS — Z79899 Other long term (current) drug therapy: Secondary | ICD-10-CM | POA: Diagnosis not present

## 2020-09-12 DIAGNOSIS — I2510 Atherosclerotic heart disease of native coronary artery without angina pectoris: Secondary | ICD-10-CM | POA: Diagnosis not present

## 2020-09-12 DIAGNOSIS — R55 Syncope and collapse: Principal | ICD-10-CM | POA: Insufficient documentation

## 2020-09-12 DIAGNOSIS — Z794 Long term (current) use of insulin: Secondary | ICD-10-CM

## 2020-09-12 DIAGNOSIS — N184 Chronic kidney disease, stage 4 (severe): Secondary | ICD-10-CM | POA: Diagnosis not present

## 2020-09-12 DIAGNOSIS — G9341 Metabolic encephalopathy: Secondary | ICD-10-CM | POA: Diagnosis not present

## 2020-09-12 DIAGNOSIS — S0003XA Contusion of scalp, initial encounter: Secondary | ICD-10-CM | POA: Diagnosis not present

## 2020-09-12 DIAGNOSIS — R634 Abnormal weight loss: Secondary | ICD-10-CM | POA: Diagnosis not present

## 2020-09-12 DIAGNOSIS — N189 Chronic kidney disease, unspecified: Secondary | ICD-10-CM

## 2020-09-12 DIAGNOSIS — E782 Mixed hyperlipidemia: Secondary | ICD-10-CM

## 2020-09-12 DIAGNOSIS — E1169 Type 2 diabetes mellitus with other specified complication: Secondary | ICD-10-CM | POA: Diagnosis present

## 2020-09-12 DIAGNOSIS — Z7982 Long term (current) use of aspirin: Secondary | ICD-10-CM | POA: Insufficient documentation

## 2020-09-12 DIAGNOSIS — N1831 Chronic kidney disease, stage 3a: Secondary | ICD-10-CM | POA: Diagnosis not present

## 2020-09-12 DIAGNOSIS — D631 Anemia in chronic kidney disease: Secondary | ICD-10-CM | POA: Diagnosis not present

## 2020-09-12 DIAGNOSIS — Z87891 Personal history of nicotine dependence: Secondary | ICD-10-CM | POA: Insufficient documentation

## 2020-09-12 DIAGNOSIS — R410 Disorientation, unspecified: Secondary | ICD-10-CM | POA: Diagnosis not present

## 2020-09-12 DIAGNOSIS — Z043 Encounter for examination and observation following other accident: Secondary | ICD-10-CM | POA: Diagnosis not present

## 2020-09-12 DIAGNOSIS — R739 Hyperglycemia, unspecified: Secondary | ICD-10-CM | POA: Diagnosis not present

## 2020-09-12 LAB — CBC WITH DIFFERENTIAL/PLATELET
Abs Immature Granulocytes: 0.03 10*3/uL (ref 0.00–0.07)
Basophils Absolute: 0 10*3/uL (ref 0.0–0.1)
Basophils Relative: 0 %
Eosinophils Absolute: 0.1 10*3/uL (ref 0.0–0.5)
Eosinophils Relative: 1 %
HCT: 36.9 % (ref 36.0–46.0)
Hemoglobin: 12.3 g/dL (ref 12.0–15.0)
Immature Granulocytes: 0 %
Lymphocytes Relative: 18 %
Lymphs Abs: 1.4 10*3/uL (ref 0.7–4.0)
MCH: 30.6 pg (ref 26.0–34.0)
MCHC: 33.3 g/dL (ref 30.0–36.0)
MCV: 91.8 fL (ref 80.0–100.0)
Monocytes Absolute: 0.4 10*3/uL (ref 0.1–1.0)
Monocytes Relative: 5 %
Neutro Abs: 5.8 10*3/uL (ref 1.7–7.7)
Neutrophils Relative %: 76 %
Platelets: 303 10*3/uL (ref 150–400)
RBC: 4.02 MIL/uL (ref 3.87–5.11)
RDW: 14.9 % (ref 11.5–15.5)
WBC: 7.7 10*3/uL (ref 4.0–10.5)
nRBC: 0 % (ref 0.0–0.2)

## 2020-09-12 LAB — COMPREHENSIVE METABOLIC PANEL
ALT: 12 U/L (ref 0–44)
AST: 22 U/L (ref 15–41)
Albumin: 3.9 g/dL (ref 3.5–5.0)
Alkaline Phosphatase: 58 U/L (ref 38–126)
Anion gap: 9 (ref 5–15)
BUN: 45 mg/dL — ABNORMAL HIGH (ref 8–23)
CO2: 26 mmol/L (ref 22–32)
Calcium: 10.4 mg/dL — ABNORMAL HIGH (ref 8.9–10.3)
Chloride: 94 mmol/L — ABNORMAL LOW (ref 98–111)
Creatinine, Ser: 2.45 mg/dL — ABNORMAL HIGH (ref 0.44–1.00)
GFR, Estimated: 20 mL/min — ABNORMAL LOW (ref >60)
Glucose, Bld: 449 mg/dL — ABNORMAL HIGH (ref 70–99)
Potassium: 4.3 mmol/L (ref 3.5–5.1)
Sodium: 129 mmol/L — ABNORMAL LOW (ref 135–145)
Total Bilirubin: 0.8 mg/dL (ref 0.3–1.2)
Total Protein: 7.9 g/dL (ref 6.5–8.1)

## 2020-09-12 LAB — POCT GLYCOSYLATED HEMOGLOBIN (HGB A1C): Hemoglobin A1C: 10.6 % — AB (ref 4.0–5.6)

## 2020-09-12 LAB — URINALYSIS, ROUTINE W REFLEX MICROSCOPIC
Bilirubin Urine: NEGATIVE
Glucose, UA: >=500 mg/dL — AB
Hgb urine dipstick: NEGATIVE
Ketones, ur: NEGATIVE mg/dL
Nitrite: NEGATIVE
Protein, ur: NEGATIVE mg/dL
Specific Gravity, Urine: 1.012 (ref 1.005–1.030)
pH: 6 (ref 5.0–8.0)

## 2020-09-12 LAB — RESP PANEL BY RT-PCR (FLU A&B, COVID) ARPGX2
Influenza A by PCR: NEGATIVE
Influenza B by PCR: NEGATIVE
SARS Coronavirus 2 by RT PCR: NEGATIVE

## 2020-09-12 LAB — POC OCCULT BLOOD, ED: Fecal Occult Bld: NEGATIVE

## 2020-09-12 LAB — TSH: TSH: 2.9 u[IU]/mL (ref 0.35–5.50)

## 2020-09-12 LAB — CBG MONITORING, ED
Glucose-Capillary: 307 mg/dL — ABNORMAL HIGH (ref 70–99)
Glucose-Capillary: 411 mg/dL — ABNORMAL HIGH (ref 70–99)

## 2020-09-12 LAB — T4, FREE: Free T4: 3.85 ng/dL — ABNORMAL HIGH (ref 0.60–1.60)

## 2020-09-12 LAB — TROPONIN I (HIGH SENSITIVITY)
Troponin I (High Sensitivity): 12 ng/L (ref <18)
Troponin I (High Sensitivity): 12 ng/L (ref <18)

## 2020-09-12 MED ORDER — LACTATED RINGERS IV BOLUS: 500.0000 mL | Freq: Once | INTRAVENOUS | Status: DC

## 2020-09-12 MED ORDER — SODIUM CHLORIDE 0.9 % IV BOLUS
1000.0000 mL | Freq: Once | INTRAVENOUS | Status: AC
  Administered 2020-09-12: 1000 mL via INTRAVENOUS

## 2020-09-12 MED ORDER — NOVOLIN N FLEXPEN 100 UNIT/ML ~~LOC~~ SUPN: 22.0000 [IU] | PEN_INJECTOR | SUBCUTANEOUS | 11 refills | Status: DC

## 2020-09-12 MED ORDER — VICTOZA 18 MG/3ML ~~LOC~~ SOPN: 1.2000 mg | PEN_INJECTOR | SUBCUTANEOUS | 0 refills | Status: DC

## 2020-09-12 NOTE — Telephone Encounter (Signed)
Patient's daughter called to advise that patient is experiencing blood sugars over 400 - is requesting call back at [phone] to assist with medication or for patient to be seen

## 2020-09-12 NOTE — Progress Notes (Signed)
Subjective:    Patient ID: Courtney Goodman, female    DOB: 05-08-1945, 75 y.o.   MRN: [id number]  HPI Pt returns for f/u of diabetes mellitus:  DM type: Insulin-requiring type 2 Dx'ed: 3614 Complications: stage 4 CRI and CAD.  Therapy: insulin since 2009, and Victoza.  GDM: never DKA: never Severe hypoglycemia: 2012, 2019, 2021, and 2022.   Pancreatitis: never.  SDOH: ins declined tresiba; she lives alone--a caretaker puts insulin out for pt, but she takes herself in the morning, when she is alone.   Other: she is on qd insulin, due to h/o noncompliance; she takes NPH insulin, due to the pattern of cbg's.   Interval history: Pt is here today with dtr [name], who says over the past week, cbg has increased to 300-500.  [name] also says she is confident pt is taking med as rx'ed, including insulin.  She takes NPH, 16 units qam, and Victoza 1.8 QD.  No recent steroids. Main symptom is nausea.   Past Medical History:  Diagnosis Date   Anemia of chronic disease    Asthma    Atherosclerotic heart disease of native coronary artery without angina pectoris    CKD (chronic kidney disease)    COPD (chronic obstructive pulmonary disease) (HCC)    DM (diabetes mellitus), type 2 with renal complications (HCC)    Dysrhythmia    Elevated ferritin 01/03/2017   GAD (generalized anxiety disorder)    Hypertension    Mixed hyperlipidemia due to type 2 diabetes mellitus (HCC)    Mixed incontinence    per medical records from [facility]   Osteopenia    Personal history of noncompliance with medical treatment, presenting hazards to health    Shortness of breath dyspnea    Suicidal ideation    TB (tuberculosis), treated    age 70   Vitamin D deficiency     Past Surgical History:  Procedure Laterality Date   ANKLE RECONSTRUCTION     right   CARPAL TUNNEL RELEASE     CORONARY ANGIOPLASTY      Social History   Socioeconomic History   Marital status: Divorced    Spouse name: Not on file   Number of  children: 2   Years of education: 12   Highest education level: High school graduate  Occupational History   Occupation: Retired  Tobacco Use   Smoking status: Former   Smokeless tobacco: Never  Scientific laboratory technician Use: Never used  Substance and Sexual Activity   Alcohol use: No   Drug use: No   Sexual activity: Never  Other Topics Concern   Not on file  Social History Narrative   Lives alone    Left handed   Social Determinants of Health   Financial Resource Strain: Medium Risk   Difficulty of Paying Living Expenses: Somewhat hard  Food Insecurity: No Food Insecurity   Worried About Charity fundraiser in the Last Year: Never true   Ran Out of Food in the Last Year: Never true  Transportation Needs: No Transportation Needs   Lack of Transportation (Medical): No   Lack of Transportation (Non-Medical): No  Physical Activity: Inactive   Days of Exercise per Week: 0 days   Minutes of Exercise per Session: 0 min  Stress: Stress Concern Present   Feeling of Stress : To some extent  Social Connections: Moderately Isolated   Frequency of Communication with Friends and Family: More than three times a week   Frequency  of Social Gatherings with Friends and Family: More than three times a week   Attends Religious Services: More than 4 times per year   Active Member of [employer] or Organizations: No   Attends Music therapist: Never   Marital Status: Divorced  Human resources officer Violence: Not At Risk   Fear of Current or Ex-Partner: No   Emotionally Abused: No   Physically Abused: No   Sexually Abused: No    Current Outpatient Medications on File Prior to Visit  Medication Sig Dispense Refill   albuterol (PROVENTIL HFA;VENTOLIN HFA) 108 (90 Base) MCG/ACT inhaler Inhale 2 puffs into the lungs every 6 (six) hours as needed for wheezing or shortness of breath. 1 Inhaler 0   Alcohol Swabs (ALCOHOL PREP) PADS Use for testing (Patient taking differently: 1 each by Other route  2 (two) times daily. E11.9) 100 each 1   amLODipine-benazepril (LOTREL) 5-20 MG capsule Take 1 capsule by mouth every morning.     aspirin EC 81 MG tablet Take 81 mg by mouth daily.      Biotin 10000 MCG TABS Take 1 tablet by mouth daily.      Blood Glucose Monitoring Suppl (TRUE METRIX METER) w/Device KIT 1 each by Does not apply route 2 (two) times daily. Use to monitor glucose levels BID; E11.29 1 kit 0   carvedilol (COREG) 6.25 MG tablet Take 1 tablet by mouth twice daily. 180 tablet 0   Cholecalciferol (VITAMIN D3) 25 MCG (1000 UT) CAPS Take 1 capsule by mouth daily.     Cholecalciferol 25 MCG (1000 UT) tablet Take 1 tablet by mouth daily. 90 tablet 0   citalopram (CELEXA) 20 MG tablet Take 1 tablet (20 mg total) by mouth daily. 90 tablet 1   cyanocobalamin 1000 MCG tablet Take 1,000 mcg by mouth daily.     fenofibrate (TRICOR) 145 MG tablet Take 1 tablet by mouth daily. (Patient taking differently: Take 145 mg by mouth daily.) 90 tablet 0   fluticasone (FLONASE) 50 MCG/ACT nasal spray Place 2 sprays into both nostrils daily. 48 g 0   folic acid (FOLVITE) 1 MG tablet Take 1 tablet (1 mg total) by mouth daily. 90 tablet 0   Insulin Pen Needle (PEN NEEDLES) 32G X 5 MM MISC 1 each by Does not apply route daily. 100 each 3   mirtazapine (REMERON) 7.5 MG tablet Take 7.5 mg by mouth at bedtime.     omeprazole (PRILOSEC) 40 MG capsule TAKE 1 CAPSULE(40 MG) BY MOUTH DAILY (Patient taking differently: Take 40 mg by mouth daily.) 90 capsule 0   rivastigmine (EXELON) 4.6 mg/24hr Place 4.6 mg onto the skin daily.     simvastatin (ZOCOR) 20 MG tablet Take 1 tablet by mouth every night. (Patient taking differently: Take 20 mg by mouth daily at 6 PM.) 90 tablet 0   traZODone (DESYREL) 50 MG tablet Take 50 mg by mouth at bedtime.      TRUE METRIX BLOOD GLUCOSE TEST test strip TEST TWICE DAILY (Patient taking differently: 1 each by Other route 2 (two) times daily.) 150 strip 3   No current  facility-administered medications on file prior to visit.    No Known Allergies  Family History  Problem Relation Age of Onset   Diabetes Mother     BP 140/62 (BP Location: Right Arm, Patient Position: Sitting, Cuff Size: Normal)   Pulse 68   Ht _0  (1.626 m)   Wt 132 lb 6.4 oz (60.1 kg)  SpO2 99%   BMI 22.73 kg/m    Review of Systems Pt continues to lose weight.  Denies vomiting and sob.     Objective:   Physical Exam Pulses: dorsalis pedis intact bilat.   MSK: no deformity of the feet CV: no leg edema.   Skin:  no ulcer on the feet.  normal color and temp on the feet. Neuro: sensation is intact to touch on the feet.   Ext: there is bilateral onychomycosis of the toenails.    Lab Results  Component Value Date   CREATININE 2.43 (H) 09/07/2020   BUN 46 (H) 09/07/2020   NA 131 (L) 09/07/2020   K 4.2 09/07/2020   CL 94 (L) 09/07/2020   CO2 26 09/07/2020   A1c=10.6%    Assessment & Plan:  Insulin-requiring type 2 DM: uncontrolled.   Weight loss: uncertain etiology and prognosis.  It may not be the Victoza.  Check TFT.   Nausea, prob due to Victoza.    Patient Instructions  Blood tests are requested for you today.  We'll let you know about the results.  I have sent 2 prescription to your pharmacy: to increase the insulin, and to reduce the Victoza.   Please see [name], to see why you are losing weight. Please call or message [place] in a few days, to tell us how the blood sugar is doing. check your blood sugar twice a day.  vary the time of day when you check, between before the 3 meals, and at bedtime.  also check if you have symptoms of your blood sugar being too high or too low.  please keep a record of the readings and bring it to your next appointment here (or you can bring the meter itself).  You can write it on any piece of paper.  please call us sooner if your blood sugar goes below 70, or if most of your readings are over 200. Please come back for a  follow-up appointment in 2 months.

## 2020-09-12 NOTE — ED Provider Notes (Signed)
[facility] EMERGENCY DEPARTMENT Provider Note   CSN: [id number] Arrival date & time: 09/12/20  1623     History Chief Complaint  Patient presents with   Loss of Consciousness    Courtney Goodman is a 75 y.o. female.  Patient with unwitnessed fall at home.  She states that she remembers she ate no food and then the next thing she remembers being on the floor.  Has pain to the back of her head.  Saw her endocrinologist today as she has been dealing with high blood sugars.  Denies being on any blood thinners.  Denies any extremity pain.  Has felt nauseous but denies any chest pain or shortness of breath.  She states that she has noticed some dark stools last several days.  The history is provided by the patient.  Loss of Consciousness Episode history:  Single Most recent episode:  Today Progression:  Resolved Chronicity:  New Context: normal activity   Witnessed: no   Relieved by:  Nothing Worsened by:  Nothing Associated symptoms: confusion (?) and nausea   Associated symptoms: no anxiety, no chest pain, no dizziness, no fever, no headaches, no palpitations, no seizures, no shortness of breath, no vomiting and no weakness       Past Medical History:  Diagnosis Date   Anemia of chronic disease    Asthma    Atherosclerotic heart disease of native coronary artery without angina pectoris    CKD (chronic kidney disease)    COPD (chronic obstructive pulmonary disease) (HCC)    DM (diabetes mellitus), type 2 with renal complications (HCC)    Dysrhythmia    Elevated ferritin 01/03/2017   GAD (generalized anxiety disorder)    Hypertension    Mixed hyperlipidemia due to type 2 diabetes mellitus ([place])    Mixed incontinence    per medical records from [facility]   Osteopenia    Personal history of noncompliance with medical treatment, presenting hazards to health    Shortness of breath dyspnea    Suicidal ideation    TB (tuberculosis), treated    age 12   Vitamin D  deficiency     Patient Active Problem List   Diagnosis Date Noted   Weight loss 09/12/2020   Syncope 09/12/2020   Hypercalcemia 06/18/2020   Multiple comorbid conditions 06/18/2020   AMS (altered mental status) 06/16/2020   Hyponatremia 06/16/2020   Poor appetite 09/29/2019   Assistance needed with transportation 09/29/2019   Medication noncompliance due to cognitive impairment 10/12/2018   Severe episode of recurrent major depressive disorder, without psychotic features ([place]) 10/12/2018   Acute renal failure superimposed on stage 3 chronic kidney disease ([place]) 09/22/2018   Leukocytosis 09/22/2018   Non-intractable vomiting    GERD (gastroesophageal reflux disease) 08/23/2017   CAD (coronary artery disease) 08/23/2017   Hypoglycemia 69/48/5462   Acute metabolic encephalopathy 70/35/0093   Hypokalemia 08/23/2017   Type II diabetes mellitus with renal manifestations ([place]) 08/23/2017   Elevated ferritin 01/03/2017   Personal history of noncompliance with medical treatment, presenting hazards to health    Urinary incontinence, mixed 05/27/2016   Major depression, recurrent ([place]) 05/27/2016   Atherosclerotic heart disease of native coronary artery without angina pectoris 05/27/2016   GAD (generalized anxiety disorder)    Anemia of chronic disease    Mixed hyperlipidemia due to type 2 diabetes mellitus ([place])    Diabetes ([place]) 11/30/2015   Vitamin D deficiency 11/23/2015   HTN (hypertension) 11/23/2015   Anemia 11/23/2015  CKD (chronic kidney disease), stage III ([place]) 11/23/2015   Dysrhythmia    COPD (chronic obstructive pulmonary disease) (HCC)     Past Surgical History:  Procedure Laterality Date   ANKLE RECONSTRUCTION     right   CARPAL TUNNEL RELEASE     CORONARY ANGIOPLASTY       OB History   No obstetric history on file.     Family History  Problem Relation Age of Onset   Diabetes Mother     Social History   Tobacco Use   Smoking status: Former    Smokeless tobacco: Never  Scientific laboratory technician Use: Never used  Substance Use Topics   Alcohol use: No   Drug use: No    Home Medications Prior to Admission medications   Medication Sig Start Date End Date Taking? Authorizing Provider  albuterol (PROVENTIL HFA;VENTOLIN HFA) 108 (90 Base) MCG/ACT inhaler Inhale 2 puffs into the lungs every 6 (six) hours as needed for wheezing or shortness of breath. 04/14/17   Henson, Vickie L, NP-C  Alcohol Swabs (ALCOHOL PREP) PADS Use for testing Patient taking differently: 1 each by Other route 2 (two) times daily. E11.9 08/07/18   Henson, Vickie L, NP-C  amLODipine (NORVASC) 10 MG tablet Take 1 tablet (10 mg total) by mouth daily. 06/05/20   Noemi Chapel, MD  amLODipine-benazepril (LOTREL) 5-20 MG capsule Take 1 capsule by mouth every morning. 05/01/20   [provider]  aspirin EC 81 MG tablet Take 81 mg by mouth daily.     [provider]  Biotin 10000 MCG TABS Take 1 tablet by mouth daily.     [provider]  Blood Glucose Monitoring Suppl (TRUE METRIX METER) w/Device KIT 1 each by Does not apply route 2 (two) times daily. Use to monitor glucose levels BID; E11.29 Patient taking differently: 1 each by Does not apply route 2 (two) times daily. E11.29 10/16/18   Renato Shin, MD  carvedilol (COREG) 6.25 MG tablet Take 1 tablet by mouth twice daily. 09/06/19   Henson, Vickie L, NP-C  Cholecalciferol (VITAMIN D3) 25 MCG (1000 UT) CAPS Take 1 capsule by mouth daily. 05/01/20   [provider]  Cholecalciferol 25 MCG (1000 UT) CHEW SMARTSIG:1 Capsule(s) By Mouth Daily 02/04/20   [provider]  Cholecalciferol 25 MCG (1000 UT) tablet Take 1 tablet by mouth daily. 08/11/19   Henson, Vickie L, NP-C  citalopram (CELEXA) 20 MG tablet Take 1 tablet (20 mg total) by mouth daily. Patient taking differently: Take 30 mg by mouth daily. 09/05/16   [name] L, NP-C  cyanocobalamin 1000 MCG tablet Take by mouth. 06/19/20 09/17/20   [provider]  fenofibrate (TRICOR) 145 MG tablet Take 1 tablet by mouth daily. Patient taking differently: Take 145 mg by mouth daily. 09/06/19   Henson, Vickie L, NP-C  fluticasone (FLONASE) 50 MCG/ACT nasal spray Place 2 sprays into both nostrils daily. 07/25/16   Henson, Vickie L, NP-C  folic acid (FOLVITE) 1 MG tablet Take 1 tablet (1 mg total) by mouth daily. 08/09/19   Henson, Vickie L, NP-C  Insulin NPH, Human,, Isophane, (NOVOLIN N FLEXPEN) 100 UNIT/ML Kiwkpen Inject 22 Units into the skin every morning. And pen needles 1/day 09/12/20   Renato Shin, MD  Insulin Pen Needle (PEN NEEDLES) 32G X 5 MM MISC 1 each by Does not apply route daily. 06/21/20   Renato Shin, MD  liraglutide (VICTOZA) 18 MG/3ML SOPN Inject 1.2 mg into the skin every  morning. 09/12/20   Renato Shin, MD  mirtazapine (REMERON) 7.5 MG tablet  03/02/20   [provider]  omeprazole (PRILOSEC) 40 MG capsule TAKE 1 CAPSULE(40 MG) BY MOUTH DAILY Patient taking differently: Take 40 mg by mouth daily. 12/13/19   Henson, Vickie L, NP-C  rivastigmine (EXELON) 4.6 mg/24hr Place 4.6 mg onto the skin daily. 06/19/20 09/17/20  [provider]  simvastatin (ZOCOR) 20 MG tablet Take 1 tablet by mouth every night. Patient taking differently: Take 20 mg by mouth daily at 6 PM. 09/06/19   Henson, Vickie L, NP-C  traZODone (DESYREL) 50 MG tablet Take 50 mg by mouth at bedtime.     [provider]  TRUE METRIX BLOOD GLUCOSE TEST test strip TEST TWICE DAILY Patient taking differently: 1 each by Other route 2 (two) times daily. 03/12/20   Renato Shin, MD    Allergies    Patient has no known allergies.  Review of Systems   Review of Systems  Constitutional:  Negative for chills and fever.  HENT:  Negative for ear pain and sore throat.   Eyes:  Negative for pain and visual disturbance.  Respiratory:  Negative for cough and shortness of breath.   Cardiovascular:  Positive for syncope. Negative for chest  pain and palpitations.  Gastrointestinal:  Positive for nausea. Negative for abdominal pain and vomiting.  Genitourinary:  Negative for dysuria and hematuria.  Musculoskeletal:  Negative for arthralgias and back pain.  Skin:  Negative for color change and rash.  Neurological:  Negative for dizziness, tremors, seizures, syncope, facial asymmetry, speech difficulty, weakness, light-headedness, numbness and headaches.  Psychiatric/Behavioral:  Positive for confusion (?).   All other systems reviewed and are negative.  Physical Exam Updated Vital Signs BP (!) 162/73   Pulse 71   Temp 97.8 F (36.6 C) (Oral)   Resp 13   Ht 5' 4"  (1.626 m)   Wt 59.9 kg   SpO2 97%   BMI 22.66 kg/m   Physical Exam Vitals and nursing note reviewed.  Constitutional:      General: She is not in acute distress.    Appearance: She is well-developed. She is not ill-appearing.  HENT:     Head:     Comments: Hematoma to the back of the head    Mouth/Throat:     Mouth: Mucous membranes are moist.  Eyes:     Extraocular Movements: Extraocular movements intact.     Conjunctiva/sclera: Conjunctivae normal.     Pupils: Pupils are equal, round, and reactive to light.  Cardiovascular:     Rate and Rhythm: Normal rate and regular rhythm.     Pulses: Normal pulses.     Heart sounds: Normal heart sounds. No murmur heard. Pulmonary:     Effort: Pulmonary effort is normal. No respiratory distress.     Breath sounds: Normal breath sounds.  Abdominal:     General: There is no distension.     Palpations: Abdomen is soft.     Tenderness: There is no abdominal tenderness.  Musculoskeletal:        General: No tenderness. Normal range of motion.     Cervical back: Normal range of motion and neck supple. No tenderness.  Skin:    General: Skin is warm and dry.     Capillary Refill: Capillary refill takes less than 2 seconds.  Neurological:     General: No focal deficit present.     Mental Status: She is alert.  Cranial Nerves: No cranial nerve deficit.     Sensory: No sensory deficit.     Motor: No weakness.     Coordination: Coordination normal.     Comments: 5+ out of 5 strength throughout, normal sensation, no drift, normal speech    ED Results / Procedures / Treatments   Labs (all labs ordered are listed, but only abnormal results are displayed) Labs Reviewed  COMPREHENSIVE METABOLIC PANEL - Abnormal; Notable for the following components:      Result Value   Sodium 129 (*)    Chloride 94 (*)    Glucose, Bld 449 (*)    BUN 45 (*)    Creatinine, Ser 2.45 (*)    Calcium 10.4 (*)    GFR, Estimated 20 (*)    All other components within normal limits  URINALYSIS, ROUTINE W REFLEX MICROSCOPIC - Abnormal; Notable for the following components:   Glucose, UA >=500 (*)    Leukocytes,Ua SMALL (*)    Bacteria, UA RARE (*)    All other components within normal limits  CBG MONITORING, ED - Abnormal; Notable for the following components:   Glucose-Capillary 411 (*)    All other components within normal limits  RESP PANEL BY RT-PCR (FLU A&B, COVID) ARPGX2  CBC WITH DIFFERENTIAL/PLATELET  POC OCCULT BLOOD, ED  TROPONIN I (HIGH SENSITIVITY)  TROPONIN I (HIGH SENSITIVITY)    EKG EKG Interpretation  Date/Time:  Tuesday September 12 2020 16:26:27 EDT Ventricular Rate:  65 PR Interval:  158 QRS Duration: 143 QT Interval:  481 QTC Calculation: 501 R Axis:   68 Text Interpretation: Sinus rhythm Probable left atrial enlargement Right bundle branch block Confirmed by Ronnald Nian, My Rinke ([id number]) on 09/12/2020 4:57:10 PM  Radiology DG Pelvis 1-2 Views  Result Date: 09/12/2020 CLINICAL DATA:  Fall EXAM: PELVIS - 1-2 VIEW COMPARISON:  05/27/2019 CT FINDINGS: There is no evidence of pelvic fracture or diastasis. No pelvic bone lesions are seen. IMPRESSION: Negative. Electronically Signed   By: Donavan Foil M.D.   On: 09/12/2020 17:24   CT HEAD WO CONTRAST (5MM)  Result Date: 09/12/2020 CLINICAL DATA:  Fall  EXAM: CT HEAD WITHOUT CONTRAST TECHNIQUE: Contiguous axial images were obtained from the base of the skull through the vertex without intravenous contrast. COMPARISON:  CT 09/07/2020 FINDINGS: Brain: No acute territorial infarction, hemorrhage or intracranial mass. Chronic small vessel ischemic changes of the white matter. Chronic basal ganglial infarcts. Stable ventricle size Vascular: No hyperdense vessels.  Carotid vascular calcification Skull: Normal. Negative for fracture or focal lesion. Sinuses/Orbits: Mucosal thickening in the sinuses Other: Large posterior scalp hematoma IMPRESSION: 1. No CT evidence for acute intracranial abnormality. 2. Atrophy and chronic small vessel ischemic change of the white matter 3. Large posterior scalp hematoma Electronically Signed   By: Donavan Foil M.D.   On: 09/12/2020 19:15   CT Cervical Spine Wo Contrast  Result Date: 09/12/2020 CLINICAL DATA:  Unwitnessed fall EXAM: CT CERVICAL SPINE WITHOUT CONTRAST TECHNIQUE: Multidetector CT imaging of the cervical spine was performed without intravenous contrast. Multiplanar CT image reconstructions were also generated. COMPARISON:  Cervical radiograph 05/29/2011 FINDINGS: Alignment: Straightening of the cervical spine. Trace retrolisthesis C3 on C4. Facet alignment within normal limits. Skull base and vertebrae: No acute fracture. No primary bone lesion or focal pathologic process. Soft tissues and spinal canal: No prevertebral fluid or swelling. No visible canal hematoma. Disc levels: Moderate to marked degenerative change at C2-C3, C3-C4, C5-C6. Moderate degenerative change at C6-C7. Upper chest: Lung apices clear Other:  None IMPRESSION: Straightening of cervical spine with trace retrolisthesis C3 on C4 likely degenerative. Multilevel degenerative change. No acute osseous abnormality. Electronically Signed   By: Donavan Foil M.D.   On: 09/12/2020 18:18   DG Chest Portable 1 View  Result Date: 09/12/2020 CLINICAL DATA:   Fall.  Syncope. EXAM: PORTABLE CHEST 1 VIEW COMPARISON:  Chest x-ray 09/07/2020 FINDINGS: The heart size and mediastinal contours are unchanged. No focal consolidation. No pulmonary edema. No pleural effusion. No pneumothorax. No acute osseous abnormality. IMPRESSION: No active disease. Electronically Signed   By: Iven Finn M.D.   On: 09/12/2020 17:15    Procedures Procedures   Medications Ordered in ED Medications  sodium chloride 0.9 % bolus 1,000 mL (0 mLs Intravenous Stopped 09/12/20 1942)    ED Course  I have reviewed the triage vital signs and the nursing notes.  Pertinent labs & imaging results that were available during my care of the patient were reviewed by me and considered in my medical decision making (see chart for details).    MDM Rules/Calculators/A&P                           Courtney Goodman is here after syncopal event.  Unwitnessed.  Unremarkable vitals.  No fever.  History of diabetes, CAD, hypertension.  Has been having high blood sugar recently.  States that she remembers seen up food and then She knows that she was on the floor.  Did not sound like she fell.  No prodromal symptoms.  Overall syncopal episode hit the back of her head where she has a large hematoma.  Neurologically she is intact.  No fever.  Overall concerning syncopal event possible arrhythmia versus ACS.  Could have traumatic head injury.  Could have infectious process.  Could be DKA.  States that she has had some black stools but Hemoccult is negative and stool is brown.  Hemoglobin unremarkable.  Doubt GI bleed.  Lab work shows hyperglycemia but no evidence of DKA.  Troponin normal.  EKG shows sinus rhythm.  Head CT and neck CT unremarkable.  Chest x-ray and pelvic x-ray without any injuries.  No urine infection.  Overall patient with poorly controlled diabetes with syncopal event.  Will admit for further observation and care.  This chart was dictated using voice recognition software.  Despite  best efforts to proofread,  errors can occur which can change the documentation meaning.   Final Clinical Impression(s) / ED Diagnoses Final diagnoses:  Syncope and collapse    Rx / DC Orders ED Discharge Orders     None        Lennice Sites, DO 09/12/20 2131

## 2020-09-12 NOTE — Telephone Encounter (Signed)
Called and spoke with Courtney Goodman who advised her mom took 16 units of Novolin this morning around 10:30. Pt had breakfast of eggs and toast and checked her blood sugar and it's 402.  Please advise.

## 2020-09-12 NOTE — ED Triage Notes (Signed)
Pt comes from home via EMS. Pt has unwittnessed fall within the last 30 minutes. Pt was taken to endo Dr today for hyperglycemia and AMS. Daughter left for 30 minutes and pt's fall alert was activated that pt had fallen. Pt has hematoma to back of head, no bleeding, denies blood thinners. Daughter reports increasing AMS and uncontrolled BGL over past week. Pt was seen here for hyperglycemia a few days ago. Pt oriented to person and time and family but does not recall event or where she was going en route to hospital. BP 160/88 Hr 67 RR 16 O2 99 RA BGL 540

## 2020-09-12 NOTE — Patient Instructions (Signed)
Blood tests are requested for you today.  We'll let you know about the results.  I have sent 2 prescription to your pharmacy: to increase the insulin, and to reduce the Victoza.   Please see [name], to see why you are losing weight. Please call or message [place] in a few days, to tell us how the blood sugar is doing. check your blood sugar twice a day.  vary the time of day when you check, between before the 3 meals, and at bedtime.  also check if you have symptoms of your blood sugar being too high or too low.  please keep a record of the readings and bring it to your next appointment here (or you can bring the meter itself).  You can write it on any piece of paper.  please call us sooner if your blood sugar goes below 70, or if most of your readings are over 200. Please come back for a follow-up appointment in 2 months.

## 2020-09-13 ENCOUNTER — Observation Stay (HOSPITAL_COMMUNITY): Payer: Medicare HMO

## 2020-09-13 DIAGNOSIS — R634 Abnormal weight loss: Secondary | ICD-10-CM | POA: Diagnosis not present

## 2020-09-13 DIAGNOSIS — E1169 Type 2 diabetes mellitus with other specified complication: Secondary | ICD-10-CM | POA: Diagnosis not present

## 2020-09-13 DIAGNOSIS — D631 Anemia in chronic kidney disease: Secondary | ICD-10-CM | POA: Diagnosis not present

## 2020-09-13 DIAGNOSIS — G9341 Metabolic encephalopathy: Secondary | ICD-10-CM | POA: Diagnosis not present

## 2020-09-13 DIAGNOSIS — N184 Chronic kidney disease, stage 4 (severe): Secondary | ICD-10-CM | POA: Diagnosis not present

## 2020-09-13 DIAGNOSIS — I10 Essential (primary) hypertension: Secondary | ICD-10-CM | POA: Diagnosis not present

## 2020-09-13 DIAGNOSIS — J449 Chronic obstructive pulmonary disease, unspecified: Secondary | ICD-10-CM | POA: Diagnosis not present

## 2020-09-13 DIAGNOSIS — E1165 Type 2 diabetes mellitus with hyperglycemia: Secondary | ICD-10-CM | POA: Diagnosis not present

## 2020-09-13 DIAGNOSIS — E782 Mixed hyperlipidemia: Secondary | ICD-10-CM | POA: Diagnosis not present

## 2020-09-13 DIAGNOSIS — R55 Syncope and collapse: Secondary | ICD-10-CM | POA: Diagnosis not present

## 2020-09-13 LAB — COMPREHENSIVE METABOLIC PANEL
ALT: 12 U/L (ref 0–44)
AST: 22 U/L (ref 15–41)
Albumin: 3.2 g/dL — ABNORMAL LOW (ref 3.5–5.0)
Alkaline Phosphatase: 52 U/L (ref 38–126)
Anion gap: 8 (ref 5–15)
BUN: 45 mg/dL — ABNORMAL HIGH (ref 8–23)
CO2: 25 mmol/L (ref 22–32)
Calcium: 9.9 mg/dL (ref 8.9–10.3)
Chloride: 100 mmol/L (ref 98–111)
Creatinine, Ser: 2.32 mg/dL — ABNORMAL HIGH (ref 0.44–1.00)
GFR, Estimated: 21 mL/min — ABNORMAL LOW (ref >60)
Glucose, Bld: 343 mg/dL — ABNORMAL HIGH (ref 70–99)
Potassium: 4.3 mmol/L (ref 3.5–5.1)
Sodium: 133 mmol/L — ABNORMAL LOW (ref 135–145)
Total Bilirubin: 0.8 mg/dL (ref 0.3–1.2)
Total Protein: 6.9 g/dL (ref 6.5–8.1)

## 2020-09-13 LAB — CBC WITH DIFFERENTIAL/PLATELET
Abs Immature Granulocytes: 0.03 10*3/uL (ref 0.00–0.07)
Basophils Absolute: 0 10*3/uL (ref 0.0–0.1)
Basophils Relative: 0 %
Eosinophils Absolute: 0.1 10*3/uL (ref 0.0–0.5)
Eosinophils Relative: 1 %
HCT: 33.5 % — ABNORMAL LOW (ref 36.0–46.0)
Hemoglobin: 11.1 g/dL — ABNORMAL LOW (ref 12.0–15.0)
Immature Granulocytes: 0 %
Lymphocytes Relative: 25 %
Lymphs Abs: 2.3 10*3/uL (ref 0.7–4.0)
MCH: 30.4 pg (ref 26.0–34.0)
MCHC: 33.1 g/dL (ref 30.0–36.0)
MCV: 91.8 fL (ref 80.0–100.0)
Monocytes Absolute: 0.6 10*3/uL (ref 0.1–1.0)
Monocytes Relative: 7 %
Neutro Abs: 6.3 10*3/uL (ref 1.7–7.7)
Neutrophils Relative %: 67 %
Platelets: 273 10*3/uL (ref 150–400)
RBC: 3.65 MIL/uL — ABNORMAL LOW (ref 3.87–5.11)
RDW: 14.9 % (ref 11.5–15.5)
WBC: 9.3 10*3/uL (ref 4.0–10.5)
nRBC: 0 % (ref 0.0–0.2)

## 2020-09-13 LAB — FOLATE: Folate: 89.7 ng/mL (ref >5.9)

## 2020-09-13 LAB — VITAMIN B12: Vitamin B-12: 959 pg/mL — ABNORMAL HIGH (ref 180–914)

## 2020-09-13 LAB — CBG MONITORING, ED
Glucose-Capillary: 237 mg/dL — ABNORMAL HIGH (ref 70–99)
Glucose-Capillary: 198 mg/dL — ABNORMAL HIGH (ref 70–99)
Glucose-Capillary: 63 mg/dL — ABNORMAL LOW (ref 70–99)
Glucose-Capillary: 106 mg/dL — ABNORMAL HIGH (ref 70–99)

## 2020-09-13 LAB — MAGNESIUM: Magnesium: 2.1 mg/dL (ref 1.7–2.4)

## 2020-09-13 LAB — LACTIC ACID, PLASMA: Lactic Acid, Venous: 1.1 mmol/L (ref 0.5–1.9)

## 2020-09-13 LAB — AMMONIA: Ammonia: 24 umol/L (ref 9–35)

## 2020-09-13 LAB — OSMOLALITY: Osmolality: 308 mOsm/kg — ABNORMAL HIGH (ref 275–295)

## 2020-09-13 MED ORDER — MIRTAZAPINE 7.5 MG PO TABS: 7.5000 mg | ORAL_TABLET | Freq: Every day | ORAL | Status: DC

## 2020-09-13 MED ORDER — ACETAMINOPHEN 650 MG RE SUPP: 650.0000 mg | Freq: Four times a day (QID) | RECTAL | Status: DC | PRN

## 2020-09-13 MED ORDER — ASPIRIN EC 81 MG PO TBEC
81.0000 mg | DELAYED_RELEASE_TABLET | Freq: Every day | ORAL | Status: DC
  Administered 2020-09-13: 81 mg via ORAL
  Filled 2020-09-13: qty 1

## 2020-09-13 MED ORDER — ONDANSETRON HCL 4 MG PO TABS: 4.0000 mg | ORAL_TABLET | Freq: Four times a day (QID) | ORAL | Status: DC | PRN

## 2020-09-13 MED ORDER — TRAZODONE HCL 50 MG PO TABS: 50.0000 mg | ORAL_TABLET | Freq: Every day | ORAL | Status: DC

## 2020-09-13 MED ORDER — SIMVASTATIN 20 MG PO TABS: 20.0000 mg | ORAL_TABLET | Freq: Every day | ORAL | Status: DC

## 2020-09-13 MED ORDER — PANTOPRAZOLE SODIUM 40 MG PO TBEC
40.0000 mg | DELAYED_RELEASE_TABLET | Freq: Every day | ORAL | Status: DC
  Administered 2020-09-13: 40 mg via ORAL
  Filled 2020-09-13: qty 1

## 2020-09-13 MED ORDER — LACTATED RINGERS IV BOLUS
500.0000 mL | Freq: Once | INTRAVENOUS | Status: AC
  Administered 2020-09-13: 500 mL via INTRAVENOUS

## 2020-09-13 MED ORDER — POLYETHYLENE GLYCOL 3350 17 G PO PACK: 17.0000 g | PACK | Freq: Every day | ORAL | Status: DC | PRN

## 2020-09-13 MED ORDER — INSULIN ASPART 100 UNIT/ML IJ SOLN
0.0000 [IU] | Freq: Three times a day (TID) | INTRAMUSCULAR | Status: DC
  Administered 2020-09-13: 3 [IU] via SUBCUTANEOUS
  Administered 2020-09-13: 5 [IU] via SUBCUTANEOUS

## 2020-09-13 MED ORDER — BENAZEPRIL HCL 20 MG PO TABS
20.0000 mg | ORAL_TABLET | Freq: Every day | ORAL | Status: DC
  Administered 2020-09-13: 20 mg via ORAL
  Filled 2020-09-13: qty 1

## 2020-09-13 MED ORDER — ACETAMINOPHEN 325 MG PO TABS: 650.0000 mg | ORAL_TABLET | Freq: Four times a day (QID) | ORAL | Status: DC | PRN

## 2020-09-13 MED ORDER — INSULIN DETEMIR 100 UNIT/ML ~~LOC~~ SOLN
15.0000 [IU] | Freq: Every day | SUBCUTANEOUS | Status: DC
  Administered 2020-09-13: 15 [IU] via SUBCUTANEOUS
  Filled 2020-09-13 (×2): qty 0.15

## 2020-09-13 MED ORDER — LACTATED RINGERS IV SOLN: INTRAVENOUS | Status: AC

## 2020-09-13 MED ORDER — CITALOPRAM HYDROBROMIDE 10 MG PO TABS
30.0000 mg | ORAL_TABLET | Freq: Every day | ORAL | Status: DC
  Administered 2020-09-13: 30 mg via ORAL
  Filled 2020-09-13: qty 3

## 2020-09-13 MED ORDER — CARVEDILOL 3.125 MG PO TABS
6.2500 mg | ORAL_TABLET | Freq: Two times a day (BID) | ORAL | Status: DC
  Administered 2020-09-13: 6.25 mg via ORAL
  Filled 2020-09-13: qty 2

## 2020-09-13 MED ORDER — ENOXAPARIN SODIUM 30 MG/0.3ML IJ SOSY
30.0000 mg | PREFILLED_SYRINGE | INTRAMUSCULAR | Status: DC
  Administered 2020-09-13: 30 mg via SUBCUTANEOUS
  Filled 2020-09-13: qty 0.3

## 2020-09-13 MED ORDER — FENOFIBRATE 160 MG PO TABS
160.0000 mg | ORAL_TABLET | Freq: Every day | ORAL | Status: DC
  Administered 2020-09-13: 160 mg via ORAL
  Filled 2020-09-13: qty 1

## 2020-09-13 MED ORDER — AMLODIPINE BESYLATE 5 MG PO TABS
5.0000 mg | ORAL_TABLET | Freq: Every day | ORAL | Status: DC
  Administered 2020-09-13: 5 mg via ORAL
  Filled 2020-09-13: qty 1

## 2020-09-13 MED ORDER — ONDANSETRON HCL 4 MG/2ML IJ SOLN: 4.0000 mg | Freq: Four times a day (QID) | INTRAMUSCULAR | Status: DC | PRN

## 2020-09-13 MED ORDER — INSULIN ASPART 100 UNIT/ML IJ SOLN
4.0000 [IU] | Freq: Three times a day (TID) | INTRAMUSCULAR | Status: DC
  Administered 2020-09-13 (×2): 4 [IU] via SUBCUTANEOUS

## 2020-09-13 NOTE — Progress Notes (Signed)
Inpatient Diabetes Program Recommendations  AACE/ADA: New Consensus Statement on Inpatient Glycemic Control (2015)  Target Ranges:  Prepandial:   less than 140 mg/dL      Peak postprandial:   less than 180 mg/dL (1-2 hours)      Critically ill patients:  140 - 180 mg/dL   Lab Results  Component Value Date   GLUCAP 237 (H) 09/13/2020   HGBA1C 10.6 (A) 09/12/2020    Review of Glycemic Control Results for Courtney Goodman, Courtney Goodman (MRN [id number]) as of 09/13/2020 10:02  Ref. Range 09/12/2020 16:28 09/12/2020 23:46 09/13/2020 08:03  Glucose-Capillary Latest Ref Range: 70 - 99 mg/dL 411 (H) 307 (H) 237 (H)   Diabetes history: DM Outpatient Diabetes medications: NPH increased from 16 to 22 units 09/12/20, Victoza decreased from 1.8 to 1.2 09/12/20 Current orders for Inpatient glycemic control: Levemir 15 units, Novolog 4 units tid meal coverage,Novolog correction 0-15 units qid  Inpatient Diabetes Program Recommendations:   Consider: -Decrease Novolog correction to 0-9 units tid + hs 0-5 units  Patient had office visit with endocrinologist Dr. Loanne Drilling yesterday with prescription changes in Victoza to 1.2 and NPH to 22 units daily.  Thank you, Nani Gasser. Yacob Wilkerson, RN, MSN, CDE  Diabetes Coordinator Inpatient Glycemic Control Team Team Pager [phone] (8am-5pm) 09/13/2020 10:12 AM

## 2020-09-13 NOTE — ED Notes (Signed)
Patient drinking orange juice for low CBG and will recheck CBG in 1 hour per Dr Bonner Puna Verbal.

## 2020-09-13 NOTE — ED Notes (Signed)
Provider at bedside

## 2020-09-13 NOTE — Progress Notes (Signed)
Physical Therapy Evaluation Patient Details Name: Courtney Goodman MRN: [id number] DOB: 1945-11-22 Today's Date: 09/13/2020   History of Present Illness  75 yo female with onset of syncopal episode was brought to hosp, note BS was over 500.  Had HA and is cleared for skull or cervical spine injury.  Pt has been confused, daughter provided history.  PMHx:  CKD 4, HTN, CAD, COPD, DM, HLD, asthma, anemia, atherosclerosis, SOB, TB, suicidal ideation  Clinical Impression  Pt was seen for short standing and stepping evaluation, and talked with she and daughter about home logistics since they are determined to take her home.  Pt has home health and a wheelchair requested to ensure safety but if the 24/7 help is not there, should go to SNF for safety and more complete rehab experience.  Follow acutely as her stay permits.    Follow Up Recommendations Home health PT;Supervision for mobility/OOB;Supervision/Assistance - 24 hour    Equipment Recommendations  Rolling walker with 5" wheels;Wheelchair (measurements PT);Wheelchair cushion (measurements PT)    Recommendations for Other Services       Precautions / Restrictions Precautions Precautions: Fall Precaution Comments: monitor sats, HR Restrictions Weight Bearing Restrictions: No      Mobility  Bed Mobility Overal bed mobility: Needs Assistance Bed Mobility: Supine to Sit;Sit to Supine     Supine to sit: Mod assist Sit to supine: Mod assist   General bed mobility comments: mod assist for safety and to move legs    Transfers Overall transfer level: Needs assistance   Transfers: Sit to/from Stand Sit to Stand: Mod assist         General transfer comment: listing to the right on walker  Ambulation/Gait Ambulation/Gait assistance: Min assist;Mod assist Gait Distance (Feet): 3 Feet Assistive device: Rolling walker (2 wheeled);1 person hand held assist Gait Pattern/deviations: Step-to pattern;Decreased stride length Gait  velocity: reduced Gait velocity interpretation: <1.8 ft/sec, indicate of risk for recurrent falls General Gait Details: leaning to the right and requires help to control balance  Stairs            Wheelchair Mobility    Modified Rankin (Stroke Patients Only)       Balance Overall balance assessment: Needs assistance   Sitting balance-Leahy Scale: Fair     Standing balance support: Bilateral upper extremity supported;During functional activity Standing balance-Leahy Scale: Poor                               Pertinent Vitals/Pain Pain Assessment: No/denies pain    Home Living Family/patient expects to be discharged to:: Private residence Living Arrangements: Spouse/significant other Available Help at Discharge: Family;Available 24 hours/day Type of Home: House Home Access: Level entry     Home Layout: One level Home Equipment: Walker - 2 wheels;Walker - 4 wheels;Cane - single point;Shower seat Additional Comments: pt is home with daughters, in different houses    Prior Function Level of Independence: Needs assistance   Gait / Transfers Assistance Needed: RW wtih independence, SPC at times  ADL's / Homemaking Assistance Needed: family assists her with home and self care at times  Comments: Pt is unclear on details and her daughter intervenes often     Hand Dominance   Dominant Hand: Right    Extremity/Trunk Assessment   Upper Extremity Assessment Upper Extremity Assessment: Generalized weakness    Lower Extremity Assessment Lower Extremity Assessment: Generalized weakness    Cervical / Trunk Assessment Cervical /  Trunk Assessment: Kyphotic  Communication   Communication: No difficulties  Cognition Arousal/Alertness: Awake/alert Behavior During Therapy: Restless Overall Cognitive Status: Impaired/Different from baseline Area of Impairment: Awareness;Safety/judgement;Memory;Orientation                 Orientation Level:  Situation   Memory: Decreased short-term memory;Decreased recall of precautions   Safety/Judgement: Decreased awareness of deficits;Decreased awareness of safety Awareness: Intellectual   General Comments: pt is unclear on details of home      General Comments General comments (skin integrity, edema, etc.): pt is unsafe to use RW alone, recommending a wheelchair for home due to her inability to balance on walker or walk far    Exercises     Assessment/Plan    PT Assessment Patient needs continued PT services  PT Problem List Decreased strength;Decreased activity tolerance;Decreased balance;Decreased mobility;Decreased coordination;Decreased cognition;Decreased knowledge of use of DME;Decreased safety awareness       PT Treatment Interventions DME instruction;Gait training;Functional mobility training;Therapeutic activities;Therapeutic exercise;Balance training;Neuromuscular re-education;Patient/family education    PT Goals (Current goals can be found in the Care Plan section)  Acute Rehab PT Goals Patient Stated Goal: to go directly home PT Goal Formulation: With patient/family Time For Goal Achievement: 09/27/20 Potential to Achieve Goals: Fair    Frequency Min 3X/week   Barriers to discharge   has one family member at a time with her    Co-evaluation               AM-PAC PT "6 Clicks" Mobility  Outcome Measure Help needed turning from your back to your side while in a flat bed without using bedrails?: A Little Help needed moving from lying on your back to sitting on the side of a flat bed without using bedrails?: A Lot Help needed moving to and from a bed to a chair (including a wheelchair)?: A Lot Help needed standing up from a chair using your arms (e.g., wheelchair or bedside chair)?: A Lot Help needed to walk in hospital room?: A Lot Help needed climbing 3-5 steps with a railing? : Total 6 Click Score: 12    End of Session Equipment Utilized During  Treatment: Gait belt Activity Tolerance: Patient limited by fatigue;Treatment limited secondary to medical complications (Comment) Patient left: in bed;with call bell/phone within reach;with family/visitor present Nurse Communication: Mobility status PT Visit Diagnosis: Unsteadiness on feet (R26.81);Muscle weakness (generalized) (M62.81);Difficulty in walking, not elsewhere classified (R26.2)    Time: 0071-2197 PT Time Calculation (min) (ACUTE ONLY): 25 min   Charges:   PT Evaluation $PT Eval Moderate Complexity: 1 Mod PT Treatments $Therapeutic Activity: 8-22 mins       Ramond Dial 09/13/2020, 3:48 PM  Mee Hives, PT MS Acute Rehab Dept. Number: [place] [facility] [place]

## 2020-09-13 NOTE — ED Notes (Signed)
PATIENT BACK TO BASELINE PER DAUGHTER, NORMAL SPEECH,  ABLE TO FOLLOW DIRECTIONS, LIFTING ARMS AND LEGS, ASSISTED WITH GETTING DRESSED, MAEE, STOOD AND PIVOTED WITHOUT DIFFICULTY TO WHEELCHAIR.

## 2020-09-13 NOTE — ED Notes (Signed)
Notified Dr Bonner Puna of change in patient status, "we were getting patient ready for discharge, IV out, dressed and attempting to have her stand to move to wheelchair and she was suddenly unable to follow/comprehend directions, she says first name correctly but otherwise confused. BP ok, HR 65, 02 sat 100%, CBG 63. still not answering questions, mumbling words or following commands".

## 2020-09-13 NOTE — Discharge Summary (Signed)
Physician Discharge Summary  Courtney Goodman TTS:[id number] DOB: 22-Mar-1945 DOA: 09/12/2020  PCP: [name], NP-C  Admit date: 09/12/2020 Discharge date: 09/13/2020  Admitted From: Home Disposition: Home   Recommendations for Outpatient Follow-up:  Follow up with PCP in 1-2 weeks Follow up with endocrinology, Dr. Loanne Drilling for ongoing diabetes management. Confirmed patient has had insulin and all needed supplies delivered to their home before time of discharge.  Please obtain BMP/CBC in one week  Home Health: PT, OT, RN, aide Equipment/Devices: Wheelchair Discharge Condition: Stable, improved CODE STATUS: Full Diet recommendation: Heart healthy, carb-modified  Brief/Interim Summary: Per HPI by Dr. Cyd Silence: 75 year old female with past medical history of chronic kidney disease stage IV (recently progressed from stage III), hypertension, coronary artery disease (cardiac anatomy unclear, no cath available, last stress 2016), COPD, insulin-dependent diabetes mellitus type 2, hyperlipidemia who presents to [facility] emergency department after experiencing an episode of suspected loss of consciousness and fall.  Patient is an extremely poor historian due to confusion.  Patient has never been formally diagnosed with dementia.  Majority the history has been obtained from discussion with the daughter via phone conversation.   Daughter explains that for the past several weeks her mother has become progressively more lethargic than usual.  This lethargy has been associated with mild confusion.  This seems to coincide with the fact that the patient's blood sugars have been extremely high over approximately the same span of time.  Daughter reports that many times blood sugars are in the 4-500 range and that the patient has been following with her endocrinologist for assistance with management of this.  Daughter denies any recent fever, cough, sick contacts or contact with confirmed COVID-19  infection.   Patient's lethargy confusion and hyperglycemia continue to persist and on 8/11 patient presented to [facility] emergency department for evaluation.  During that time patient was found to have severe hyperglycemia of 576 which seemed to improve with administration of intravenous insulin and intravenous normal saline bolus.  ER provider felt patient may not be taking her insulin as instructed, placed a social work referral and discharge patient home.     Daughter states that patient's symptoms of hyperglycemia and lethargy/confusion continue to persist.  At approximately 3 PM on 8/16 she left her mother at the house eating at the dinner table.  At this point the patient reports that she was eating and shortly after she was finished she stood up but suddenly lost consciousness.  She does not recall what happened next but only recalls now being in the emergency department.  Patient has since complained of severe occipital headache.  Patient is life alert bracelet contacted the daughter and EMS and the patient was therefore promptly brought into [facility] emergency department for evaluation.  Upon evaluation in the emergency department, CT imaging the head revealed no intracranial hemorrhage but revealed a large scalp hematoma in the occipital region.  Patient was once again found to be extremely hyperglycemic with blood sugars approaching 500.  Chest x-ray, COVID testing, urinalysis were unremarkable.  Patient was given 1 L of isotonic fluids.  Due to unexplained suspected syncope as well as ongoing severe hyperglycemia and clinical evidence of volume depletion the hospitalist group was then called to assess the patient for admission to the hospital.  Discharge Diagnoses:  Principal Problem:   Syncope Active Problems:   Essential hypertension   COPD (chronic obstructive pulmonary disease) (HCC)   Anemia due to chronic kidney disease  Mixed hyperlipidemia due to type 2  diabetes mellitus ([place])   Acute metabolic encephalopathy   Uncontrolled type 2 diabetes mellitus with hyperglycemia, with long-term current use of insulin (HCC)   CKD (chronic kidney disease), stage IV (HCC)  Concussion, posterior scalp hematoma: No underlying fracture or intracranial hemorrhage.  - Supportive care/pain control. Pt's mentation is back to baseline per daughter.   IDT2DM with uncontrolled hyperglycemia (due to not having insulin): Improved, has insulin at home now. Had some symptomatic hypoglycemia prior to discharge which has abated by taking oral food/drink with resolution of mental status changes.  - F/u with Dr. Loanne Drilling with records of CBGs and take insulin as prescribed.  Acute metabolic encephalopathy: Due to wide swings of blood glucose levels. Suspect some neuroglycopenic symptoms would arise even with normal glucose levels with recent persistent hyperglycemia.  - Resolved.   Orthostatic syncope: Due to osmotic diuresis/dehydration. No major dysrhythmias noted on cardiac monitoring. No hx structural heart disease or abnormal exam findings.  - Home health therapies and DME ordered as above.  - Orthostatic symptoms have resolved with IVF and pt taking adequate po now.   Other chronic medical conditions were stable during observation period.   Discharge Instructions Discharge Instructions     Diet - low sodium heart healthy   Complete by: As directed    Discharge instructions   Complete by: As directed    You were evaluated for an episode of passing out with suspected concussion. The CT of the head showed no internal bleeding on the brain, stroke, or skull fracture. You have been evaluated by physical therapy and will have home health therapies arranged for you at discharge and a wheelchair as well.   The cause for passing out is suspected to be severe dehydration from uncontrolled high blood sugar. You will need to take the insulin and victoza as prescribed by your  endocrinologist and follow up there in the next 2-4 weeks. Seek medical attention right away if you experience another episode or if blood sugars do not regulate with medications as prescribed.   Increase activity slowly   Complete by: As directed       Allergies as of 09/13/2020   No Known Allergies      Medication List     STOP taking these medications    amLODipine 10 MG tablet Commonly known as: NORVASC       TAKE these medications    albuterol 108 (90 Base) MCG/ACT inhaler Commonly known as: VENTOLIN HFA Inhale 2 puffs into the lungs every 6 (six) hours as needed for wheezing or shortness of breath.   Alcohol Prep Pads Use for testing What changed:  how much to take how to take this when to take this additional instructions   amLODipine-benazepril 5-20 MG capsule Commonly known as: LOTREL Take 1 capsule by mouth every morning.   aspirin EC 81 MG tablet Take 81 mg by mouth daily.   Biotin 10000 MCG Tabs Take 1 tablet by mouth daily.   carvedilol 6.25 MG tablet Commonly known as: COREG Take 1 tablet by mouth twice daily.   Cholecalciferol 25 MCG (1000 UT) tablet Take 1 tablet by mouth daily.   Vitamin D3 25 MCG (1000 UT) Caps Take 1 capsule by mouth daily.   citalopram 20 MG tablet Commonly known as: CELEXA Take 1 tablet (20 mg total) by mouth daily. What changed: how much to take   cyanocobalamin 1000 MCG tablet Take 1,000 mcg by mouth daily.  fenofibrate 145 MG tablet Commonly known as: TRICOR Take 1 tablet by mouth daily.   fluticasone 50 MCG/ACT nasal spray Commonly known as: FLONASE Place 2 sprays into both nostrils daily.   folic acid 1 MG tablet Commonly known as: FOLVITE Take 1 tablet (1 mg total) by mouth daily.   mirtazapine 7.5 MG tablet Commonly known as: REMERON Take 7.5 mg by mouth at bedtime.   NovoLIN N FlexPen 100 UNIT/ML Kiwkpen Generic drug: Insulin NPH (Human) (Isophane) Inject 22 Units into the skin every  morning. And pen needles 1/day   omeprazole 40 MG capsule Commonly known as: PRILOSEC TAKE 1 CAPSULE(40 MG) BY MOUTH DAILY What changed: See the new instructions.   Pen Needles 32G X 5 MM Misc 1 each by Does not apply route daily.   rivastigmine 4.6 mg/24hr Commonly known as: EXELON Place 4.6 mg onto the skin daily.   simvastatin 20 MG tablet Commonly known as: ZOCOR Take 1 tablet by mouth every night. What changed: when to take this   traZODone 50 MG tablet Commonly known as: DESYREL Take 50 mg by mouth at bedtime.   True Metrix Blood Glucose Test test strip Generic drug: glucose blood TEST TWICE DAILY What changed:  how much to take how to take this additional instructions   True Metrix Meter w/Device Kit 1 each by Does not apply route 2 (two) times daily. Use to monitor glucose levels BID; E11.29 What changed:  when to take this additional instructions   Victoza 18 MG/3ML Sopn Generic drug: liraglutide Inject 1.2 mg into the skin every morning.               Durable Medical Equipment  (From admission, onward)           Start     Ordered   09/13/20 1212  For home use only DME standard manual wheelchair with seat cushion  Once       Comments: Patient suffers from gait imbalance which impairs their ability to perform daily activities like bathing and dressing in the home.  A cane will not resolve issue with performing activities of daily living. A wheelchair will allow patient to safely perform daily activities. Patient can safely propel the wheelchair in the home or has a caregiver who can provide assistance. Length of need 6 months . Accessories: elevating leg rests (ELRs), wheel locks, extensions and anti-tippers.   09/13/20 1211            Follow-up Information     Henson, Vickie L, NP-C Follow up.   Specialty: Family Medicine Contact information: [place] [address] [phone]         Renato Shin, MD Follow  up.   Specialty: Endocrinology Contact information: [address] [place] [address] [phone]                No Known Allergies  Consultations: None  Procedures/Studies: DG Chest 2 View  Result Date: 09/07/2020 CLINICAL DATA:  Fall, dementia EXAM: CHEST - 2 VIEW COMPARISON:  06/05/2020 FINDINGS: Mild left basilar scarring. Right lung is clear. No pleural effusion or pneumothorax. The heart is normal in size. Visualized osseous structures are within normal limits. IMPRESSION: No evidence of acute cardiopulmonary disease. Electronically Signed   By: Julian Hy M.D.   On: 09/07/2020 03:02   DG Pelvis 1-2 Views  Result Date: 09/12/2020 CLINICAL DATA:  Fall EXAM: PELVIS - 1-2 VIEW COMPARISON:  05/27/2019 CT FINDINGS: There is no  evidence of pelvic fracture or diastasis. No pelvic bone lesions are seen. IMPRESSION: Negative. Electronically Signed   By: Donavan Foil M.D.   On: 09/12/2020 17:24   CT HEAD WO CONTRAST (5MM)  Result Date: 09/12/2020 CLINICAL DATA:  Fall EXAM: CT HEAD WITHOUT CONTRAST TECHNIQUE: Contiguous axial images were obtained from the base of the skull through the vertex without intravenous contrast. COMPARISON:  CT 09/07/2020 FINDINGS: Brain: No acute territorial infarction, hemorrhage or intracranial mass. Chronic small vessel ischemic changes of the white matter. Chronic basal ganglial infarcts. Stable ventricle size Vascular: No hyperdense vessels.  Carotid vascular calcification Skull: Normal. Negative for fracture or focal lesion. Sinuses/Orbits: Mucosal thickening in the sinuses Other: Large posterior scalp hematoma IMPRESSION: 1. No CT evidence for acute intracranial abnormality. 2. Atrophy and chronic small vessel ischemic change of the white matter 3. Large posterior scalp hematoma Electronically Signed   By: Donavan Foil M.D.   On: 09/12/2020 19:15   CT Head Wo Contrast  Result Date: 09/07/2020 CLINICAL DATA:  Mental status  change EXAM: CT HEAD WITHOUT CONTRAST TECHNIQUE: Contiguous axial images were obtained from the base of the skull through the vertex without intravenous contrast. COMPARISON:  09/07/2020 1:53 a.m. and 06/05/2020 FINDINGS: Brain: No evidence of acute infarction, hemorrhage, hydrocephalus, extra-axial collection or mass lesion/mass effect. Unchanged cerebral and cerebellar atrophy. Unchanged right basal ganglia lacunar infarcts, with a left basal ganglia lacunar infarct that is unchanged compared to the same day exam but has increased in size compared to 06/05/2020. Hypodensity in the periventricular white matter, likely sequela of chronic small vessel ischemic disease. Vascular: No hyperdense vessel or unexpected calcification. Skull: Normal. Negative for fracture or focal lesion. Sinuses/Orbits: Mucosal thickening in the right maxillary sinus. Orbits are unremarkable. Other: None. IMPRESSION: No acute intracranial process. Redemonstrated chronic right basal ganglia lacunar infarct, with progression of a left basal ganglial lacunar infarct compared to 06/05/2020. Electronically Signed   By: Merilyn Baba MD   On: 09/07/2020 08:29   CT Cervical Spine Wo Contrast  Result Date: 09/12/2020 CLINICAL DATA:  Unwitnessed fall EXAM: CT CERVICAL SPINE WITHOUT CONTRAST TECHNIQUE: Multidetector CT imaging of the cervical spine was performed without intravenous contrast. Multiplanar CT image reconstructions were also generated. COMPARISON:  Cervical radiograph 05/29/2011 FINDINGS: Alignment: Straightening of the cervical spine. Trace retrolisthesis C3 on C4. Facet alignment within normal limits. Skull base and vertebrae: No acute fracture. No primary bone lesion or focal pathologic process. Soft tissues and spinal canal: No prevertebral fluid or swelling. No visible canal hematoma. Disc levels: Moderate to marked degenerative change at C2-C3, C3-C4, C5-C6. Moderate degenerative change at C6-C7. Upper chest: Lung apices clear  Other: None IMPRESSION: Straightening of cervical spine with trace retrolisthesis C3 on C4 likely degenerative. Multilevel degenerative change. No acute osseous abnormality. Electronically Signed   By: Donavan Foil M.D.   On: 09/12/2020 18:18   DG Chest Portable 1 View  Result Date: 09/12/2020 CLINICAL DATA:  Fall.  Syncope. EXAM: PORTABLE CHEST 1 VIEW COMPARISON:  Chest x-ray 09/07/2020 FINDINGS: The heart size and mediastinal contours are unchanged. No focal consolidation. No pulmonary edema. No pleural effusion. No pneumothorax. No acute osseous abnormality. IMPRESSION: No active disease. Electronically Signed   By: Iven Finn M.D.   On: 09/12/2020 17:15     Subjective: Feels well, eating and drinking normally. At mental baseline. No lightheadedness when getting up.   Discharge Exam: Vitals:   09/13/20 1730 09/13/20 1800  BP: 125/61 128/62  Pulse: 63 65  Resp:  18 12  Temp:  98 F (36.7 C)  SpO2: 100% 100%   General: Pt is alert, awake, not in acute distress Cardiovascular: RRR, S1/S2 +, no rubs, no gallops. no murmur. Respiratory: CTA bilaterally, no wheezing, no rhonchi Abdominal: Soft, NT, ND, bowel sounds + Extremities: No edema, no cyanosis  Labs: BNP (last 3 results) No results for input(s): BNP in the last 8760 hours. Basic Metabolic Panel: Recent Labs  Lab 09/07/20 0058 09/07/20 0227 09/12/20 1719 09/13/20 0038  NA 129* 131* 129* 133*  K 4.1 4.2 4.3 4.3  CL 94*  --  94* 100  CO2 26  --  26 25  GLUCOSE 524*  --  449* 343*  BUN 46*  --  45* 45*  CREATININE 2.43*  --  2.45* 2.32*  CALCIUM 10.0  --  10.4* 9.9  MG  --   --   --  2.1   Liver Function Tests: Recent Labs  Lab 09/07/20 0058 09/12/20 1719 09/13/20 0038  AST 21 22 22   ALT 13 12 12   ALKPHOS 49 58 52  BILITOT 0.3 0.8 0.8  PROT 7.3 7.9 6.9  ALBUMIN 3.5 3.9 3.2*   No results for input(s): LIPASE, AMYLASE in the last 168 hours. Recent Labs  Lab 09/13/20 0035  AMMONIA 24   CBC: Recent  Labs  Lab 09/07/20 0058 09/07/20 0227 09/12/20 1719 09/13/20 0038  WBC 7.2  --  7.7 9.3  NEUTROABS 4.5  --  5.8 6.3  HGB 11.2* 11.9* 12.3 11.1*  HCT 33.8* 35.0* 36.9 33.5*  MCV 92.9  --  91.8 91.8  PLT 278  --  303 273   Cardiac Enzymes: No results for input(s): CKTOTAL, CKMB, CKMBINDEX, TROPONINI in the last 168 hours. BNP: Invalid input(s): POCBNP CBG: Recent Labs  Lab 09/12/20 2346 09/13/20 0803 09/13/20 1124 09/13/20 1601 09/13/20 1711  GLUCAP 307* 237* 198* 63* 106*   D-Dimer No results for input(s): DDIMER in the last 72 hours. Hgb A1c Recent Labs    09/12/20 1329  HGBA1C 10.6*   Lipid Profile No results for input(s): CHOL, HDL, LDLCALC, TRIG, CHOLHDL, LDLDIRECT in the last 72 hours. Thyroid function studies Recent Labs    09/12/20 1339  TSH 2.90   Anemia work up Recent Labs    09/13/20 0035  VITAMINB12 959*  FOLATE 89.7   Urinalysis    Component Value Date/Time   COLORURINE YELLOW 09/12/2020 2000   APPEARANCEUR CLEAR 09/12/2020 2000   LABSPEC 1.012 09/12/2020 2000   LABSPEC 1.020 10/12/2018 1055   PHURINE 6.0 09/12/2020 2000   GLUCOSEU >=500 (A) 09/12/2020 2000   HGBUR NEGATIVE 09/12/2020 2000   BILIRUBINUR NEGATIVE 09/12/2020 2000   BILIRUBINUR negative 10/12/2018 1055   BILIRUBINUR n 07/01/2016 [place] 09/12/2020 2000   PROTEINUR NEGATIVE 09/12/2020 2000   UROBILINOGEN negative (A) 07/01/2016 1139   NITRITE NEGATIVE 09/12/2020 2000   LEUKOCYTESUR SMALL (A) 09/12/2020 2000    Microbiology Recent Results (from the past 240 hour(s))  Resp Panel by RT-PCR (Flu A&B, Covid) Nasopharyngeal Swab     Status: None   Collection Time: 09/07/20  8:40 AM   Specimen: Nasopharyngeal Swab; Nasopharyngeal(NP) swabs in vial transport medium  Result Value Ref Range Status   SARS Coronavirus 2 by RT PCR NEGATIVE NEGATIVE Final    Comment: (NOTE) SARS-CoV-2 target nucleic acids are NOT DETECTED.  The SARS-CoV-2 RNA is generally  detectable in upper respiratory specimens during the acute phase of infection. The lowest concentration of  SARS-CoV-2 viral copies this assay can detect is 138 copies/mL. A negative result does not preclude SARS-Cov-2 infection and should not be used as the sole basis for treatment or other patient management decisions. A negative result may occur with  improper specimen collection/handling, submission of specimen other than nasopharyngeal swab, presence of viral mutation(s) within the areas targeted by this assay, and inadequate number of viral copies(<138 copies/mL). A negative result must be combined with clinical observations, patient history, and epidemiological information. The expected result is Negative.  Fact Sheet for Patients:  EntrepreneurPulse.com.au  Fact Sheet for Healthcare Providers:  IncredibleEmployment.be  This test is no t yet approved or cleared by the [place] FDA and  has been authorized for detection and/or diagnosis of SARS-CoV-2 by FDA under an Emergency Use Authorization (EUA). This EUA will remain  in effect (meaning this test can be used) for the duration of the COVID-19 declaration under Section 564(b)(1) of the Act, 21 U.S.C.section 360bbb-3(b)(1), unless the authorization is terminated  or revoked sooner.       Influenza A by PCR NEGATIVE NEGATIVE Final   Influenza B by PCR NEGATIVE NEGATIVE Final    Comment: (NOTE) The Xpert Xpress SARS-CoV-2/FLU/RSV plus assay is intended as an aid in the diagnosis of influenza from Nasopharyngeal swab specimens and should not be used as a sole basis for treatment. Nasal washings and aspirates are unacceptable for Xpert Xpress SARS-CoV-2/FLU/RSV testing.  Fact Sheet for Patients: EntrepreneurPulse.com.au  Fact Sheet for Healthcare Providers: IncredibleEmployment.be  This test is not yet approved or cleared by the [place] FDA  and has been authorized for detection and/or diagnosis of SARS-CoV-2 by FDA under an Emergency Use Authorization (EUA). This EUA will remain in effect (meaning this test can be used) for the duration of the COVID-19 declaration under Section 564(b)(1) of the Act, 21 U.S.C. section 360bbb-3(b)(1), unless the authorization is terminated or revoked.  Performed at [facility], [place] [address] [place], [place] [address]   Resp Panel by RT-PCR (Flu A&B, Covid) Nasopharyngeal Swab     Status: None   Collection Time: 09/12/20  5:13 PM   Specimen: Nasopharyngeal Swab; Nasopharyngeal(NP) swabs in vial transport medium  Result Value Ref Range Status   SARS Coronavirus 2 by RT PCR NEGATIVE NEGATIVE Final    Comment: (NOTE) SARS-CoV-2 target nucleic acids are NOT DETECTED.  The SARS-CoV-2 RNA is generally detectable in upper respiratory specimens during the acute phase of infection. The lowest concentration of SARS-CoV-2 viral copies this assay can detect is 138 copies/mL. A negative result does not preclude SARS-Cov-2 infection and should not be used as the sole basis for treatment or other patient management decisions. A negative result may occur with  improper specimen collection/handling, submission of specimen other than nasopharyngeal swab, presence of viral mutation(s) within the areas targeted by this assay, and inadequate number of viral copies(<138 copies/mL). A negative result must be combined with clinical observations, patient history, and epidemiological information. The expected result is Negative.  Fact Sheet for Patients:  EntrepreneurPulse.com.au  Fact Sheet for Healthcare Providers:  IncredibleEmployment.be  This test is no t yet approved or cleared by the [place] FDA and  has been authorized for detection and/or diagnosis of SARS-CoV-2 by FDA under an Emergency Use Authorization (EUA). This EUA will remain  in effect  (meaning this test can be used) for the duration of the COVID-19 declaration under Section 564(b)(1) of the Act, 21 U.S.C.section 360bbb-3(b)(1), unless the authorization is terminated  or  revoked sooner.       Influenza A by PCR NEGATIVE NEGATIVE Final   Influenza B by PCR NEGATIVE NEGATIVE Final    Comment: (NOTE) The Xpert Xpress SARS-CoV-2/FLU/RSV plus assay is intended as an aid in the diagnosis of influenza from Nasopharyngeal swab specimens and should not be used as a sole basis for treatment. Nasal washings and aspirates are unacceptable for Xpert Xpress SARS-CoV-2/FLU/RSV testing.  Fact Sheet for Patients: EntrepreneurPulse.com.au  Fact Sheet for Healthcare Providers: IncredibleEmployment.be  This test is not yet approved or cleared by the [place] FDA and has been authorized for detection and/or diagnosis of SARS-CoV-2 by FDA under an Emergency Use Authorization (EUA). This EUA will remain in effect (meaning this test can be used) for the duration of the COVID-19 declaration under Section 564(b)(1) of the Act, 21 U.S.C. section 360bbb-3(b)(1), unless the authorization is terminated or revoked.  Performed at [facility], [place] [address], [place], [place] [address]     Time coordinating discharge: Approximately 40 minutes  Patrecia Pour, MD  [facility] 09/13/2020, 6:19 PM

## 2020-09-13 NOTE — ED Notes (Signed)
Patient sitting up in bed eating lunch

## 2020-09-13 NOTE — ED Notes (Signed)
Ginger ale and cookies given per patient request. Speech more clear, following direction and feeding self cookies.

## 2020-09-13 NOTE — ED Notes (Signed)
CBG 106, patient continues to improve mentally, still not fully back to baseline per daughter, Dr Bonner Puna notified.

## 2020-09-13 NOTE — ED Notes (Signed)
Patient sitting up eating breakfast, daughter at bedside.

## 2020-09-13 NOTE — Discharge Planning (Signed)
Monai Hindes J. Clydene Laming, RN, BSN, [place] [phone] RNCM spoke with pt at bedside regarding discharge planning for [place]. Offered pt medicare.gov list of home health agencies to choose from.  Pt chose [facility] to render services. Lattie Haw of [facility] notified. Patient made aware that [facility] will be in contact in 24-48 hours.   DME needs identified at this time include wheelchair.  Wheelchair will be delivered by [employer].

## 2020-09-13 NOTE — H&P (Signed)
History and Physical    Courtney Goodman LOV:[id number] DOB: 1945/02/09 DOA: 09/12/2020  PCP: [name], NP-C  Patient coming from: Home via EMS   Chief Complaint:  Chief Complaint  Patient presents with   Loss of Consciousness     HPI:    75 year old female with past medical history of chronic kidney disease stage IV (recently progressed from stage III), hypertension, coronary artery disease (cardiac anatomy unclear, no cath available, last stress 2016), COPD, insulin-dependent diabetes mellitus type 2, hyperlipidemia who presents to [facility] emergency department after experiencing an episode of suspected loss of consciousness and fall.  Patient is an extremely poor historian due to confusion.  Patient has never been formally diagnosed with dementia.  Majority the history has been obtained from discussion with the daughter via phone conversation.  Daughter explains that for the past several weeks her mother has become progressively more lethargic than usual.  This lethargy has been associated with mild confusion.  This seems to coincide with the fact that the patient's blood sugars have been extremely high over approximately the same span of time.  Daughter reports that many times blood sugars are in the 4-500 range and that the patient has been following with her endocrinologist for assistance with management of this.  Daughter denies any recent fever, cough, sick contacts or contact with confirmed COVID-19 infection.  Patient's lethargy confusion and hyperglycemia continue to persist and on 8/11 patient presented to [facility] emergency department for evaluation.  During that time patient was found to have severe hyperglycemia of 576 which seemed to improve with administration of intravenous insulin and intravenous normal saline bolus.  ER provider felt patient may not be taking her insulin as instructed, placed a social work referral and discharge patient home.     Daughter states that patient's symptoms of hyperglycemia and lethargy/confusion continue to persist.  At approximately 3 PM on 8/16 she left her mother at the house eating at the dinner table.  At this point the patient reports that she was eating and shortly after she was finished she stood up but suddenly lost consciousness.  She does not recall what happened next but only recalls now being in the emergency department.  Patient has since complained of severe occipital headache.  Patient is life alert bracelet contacted the daughter and EMS and the patient was therefore promptly brought into [facility] emergency department for evaluation.  Upon evaluation in the emergency department, CT imaging the head revealed no intracranial hemorrhage but revealed a large scalp hematoma in the occipital region.  Patient was once again found to be extremely hyperglycemic with blood sugars approaching 500.  Chest x-ray, COVID testing, urinalysis were unremarkable.  Patient was given 1 L of isotonic fluids.  Due to unexplained suspected syncope as well as ongoing severe hyperglycemia and clinical evidence of volume depletion the hospitalist group was then called to assess the patient for admission to the hospital.  Review of Systems:   Review of Systems  Unable to perform ROS: Mental status change   Past Medical History:  Diagnosis Date   Anemia of chronic disease    Asthma    Atherosclerotic heart disease of native coronary artery without angina pectoris    CKD (chronic kidney disease)    COPD (chronic obstructive pulmonary disease) ([place])    DM (diabetes mellitus), type 2 with renal complications ([place])    Dysrhythmia    Elevated ferritin 01/03/2017   GAD (generalized anxiety disorder)  Hypertension    Mixed hyperlipidemia due to type 2 diabetes mellitus (HCC)    Mixed incontinence    per medical records from [facility]   Osteopenia    Personal history of noncompliance with medical treatment,  presenting hazards to health    Shortness of breath dyspnea    Suicidal ideation    TB (tuberculosis), treated    age 49   Vitamin D deficiency     Past Surgical History:  Procedure Laterality Date   ANKLE RECONSTRUCTION     right   CARPAL TUNNEL RELEASE     CORONARY ANGIOPLASTY       reports that she has quit smoking. She has never used smokeless tobacco. She reports that she does not drink alcohol and does not use drugs.  No Known Allergies  Family History  Problem Relation Age of Onset   Diabetes Mother      Prior to Admission medications   Medication Sig Start Date End Date Taking? Authorizing Provider  albuterol (PROVENTIL HFA;VENTOLIN HFA) 108 (90 Base) MCG/ACT inhaler Inhale 2 puffs into the lungs every 6 (six) hours as needed for wheezing or shortness of breath. 04/14/17  Yes Henson, Vickie L, NP-C  Alcohol Swabs (ALCOHOL PREP) PADS Use for testing Patient taking differently: 1 each by Other route 2 (two) times daily. E11.9 08/07/18  Yes Henson, Vickie L, NP-C  amLODipine-benazepril (LOTREL) 5-20 MG capsule Take 1 capsule by mouth every morning. 05/01/20  Yes [provider]  aspirin EC 81 MG tablet Take 81 mg by mouth daily.    Yes [provider]  Biotin 10000 MCG TABS Take 1 tablet by mouth daily.    Yes [provider]  Blood Glucose Monitoring Suppl (TRUE METRIX METER) w/Device KIT 1 each by Does not apply route 2 (two) times daily. Use to monitor glucose levels BID; E11.29 Patient taking differently: 1 each by Does not apply route 2 (two) times daily. E11.29 10/16/18  Yes Renato Shin, MD  carvedilol (COREG) 6.25 MG tablet Take 1 tablet by mouth twice daily. 09/06/19  Yes Henson, Vickie L, NP-C  Cholecalciferol (VITAMIN D3) 25 MCG (1000 UT) CAPS Take 1 capsule by mouth daily. 05/01/20  Yes [provider]  Cholecalciferol 25 MCG (1000 UT) tablet Take 1 tablet by mouth daily. 08/11/19  Yes Henson, Vickie L, NP-C  citalopram (CELEXA) 20 MG  tablet Take 1 tablet (20 mg total) by mouth daily. Patient taking differently: Take 30 mg by mouth daily. 09/05/16  Yes Henson, Vickie L, NP-C  cyanocobalamin 1000 MCG tablet Take 1,000 mcg by mouth daily. 06/19/20 09/17/20 Yes [provider]  fenofibrate (TRICOR) 145 MG tablet Take 1 tablet by mouth daily. Patient taking differently: Take 145 mg by mouth daily. 09/06/19  Yes Henson, Vickie L, NP-C  fluticasone (FLONASE) 50 MCG/ACT nasal spray Place 2 sprays into both nostrils daily. 07/25/16  Yes Henson, Vickie L, NP-C  folic acid (FOLVITE) 1 MG tablet Take 1 tablet (1 mg total) by mouth daily. 08/09/19  Yes Henson, Vickie L, NP-C  Insulin NPH, Human,, Isophane, (NOVOLIN N FLEXPEN) 100 UNIT/ML Kiwkpen Inject 22 Units into the skin every morning. And pen needles 1/day 09/12/20  Yes Renato Shin, MD  Insulin Pen Needle (PEN NEEDLES) 32G X 5 MM MISC 1 each by Does not apply route daily. 06/21/20  Yes Renato Shin, MD  liraglutide (VICTOZA) 18 MG/3ML SOPN Inject 1.2 mg into the skin every morning. 09/12/20  Yes Renato Shin, MD  mirtazapine (REMERON) 7.5  MG tablet Take 7.5 mg by mouth at bedtime. 03/02/20  Yes [provider]  omeprazole (PRILOSEC) 40 MG capsule TAKE 1 CAPSULE(40 MG) BY MOUTH DAILY Patient taking differently: Take 40 mg by mouth daily. 12/13/19  Yes Henson, Vickie L, NP-C  rivastigmine (EXELON) 4.6 mg/24hr Place 4.6 mg onto the skin daily. 06/19/20 09/17/20 Yes [provider]  simvastatin (ZOCOR) 20 MG tablet Take 1 tablet by mouth every night. Patient taking differently: Take 20 mg by mouth daily at 6 PM. 09/06/19  Yes Henson, Vickie L, NP-C  traZODone (DESYREL) 50 MG tablet Take 50 mg by mouth at bedtime.    Yes [provider]  TRUE METRIX BLOOD GLUCOSE TEST test strip TEST TWICE DAILY Patient taking differently: 1 each by Other route 2 (two) times daily. 03/12/20  Yes Renato Shin, MD  amLODipine (NORVASC) 10 MG tablet Take 1 tablet (10 mg total) by mouth  daily. Patient not taking: Reported on 09/12/2020 06/05/20   Noemi Chapel, MD    Physical Exam: Vitals:   09/12/20 2200 09/12/20 2215 09/12/20 2230 09/12/20 2315  BP: 129/69 (!) 112/51 (!) 149/68 128/65  Pulse: 67 68 66 66  Resp: 20 16 17 16   Temp:      TempSrc:      SpO2: 100% 100% 97% 100%  Weight:      Height:        Constitutional: Lethargic arousable, oriented x2, patient is in mild distress due to headache of the occipital region. Skin: no rashes, no lesions, extremely poor skin turgor noted. Eyes: Pupils are equally reactive to light.  No evidence of scleral icterus or conjunctival pallor.  ENMT: Extremely dry mucous membranes noted.  Posterior pharynx clear of any exudate or lesions.   Neck: normal, supple, no masses, no thyromegaly.  No evidence of jugular venous distension.   Respiratory: clear to auscultation bilaterally, no wheezing, no crackles. Normal respiratory effort. No accessory muscle use.  Cardiovascular: Regular rate and rhythm, no murmurs / rubs / gallops. No extremity edema. 2+ pedal pulses. No carotid bruits.  Chest:   Nontender without crepitus or deformity.   Back:   Nontender without crepitus or deformity. Abdomen: Abdomen is soft and nontender.  No evidence of intra-abdominal masses.  Positive bowel sounds noted in all quadrants.   Musculoskeletal: No joint deformity upper and lower extremities. Good ROM, no contractures. Normal muscle tone.  Neurologic: Patient is lethargic but arousable and oriented x3.  Patient does consistently follow commands.  Sensation intact.  Patient moving all 4 extremities spontaneously.    Patient is responsive to verbal stimuli.   Psychiatric: Unable to assess due to substantial lethargy.  Patient currently does not seem to possess insight as to her current situation.   Labs on Admission: I have personally reviewed following labs and imaging studies -   CBC: Recent Labs  Lab 09/07/20 0058 09/07/20 0227 09/12/20 1719  WBC  7.2  --  7.7  NEUTROABS 4.5  --  5.8  HGB 11.2* 11.9* 12.3  HCT 33.8* 35.0* 36.9  MCV 92.9  --  91.8  PLT 278  --  532   Basic Metabolic Panel: Recent Labs  Lab 09/07/20 0058 09/07/20 0227 09/12/20 1719  NA 129* 131* 129*  K 4.1 4.2 4.3  CL 94*  --  94*  CO2 26  --  26  GLUCOSE 524*  --  449*  BUN 46*  --  45*  CREATININE 2.43*  --  2.45*  CALCIUM 10.0  --  10.4*   GFR: Estimated Creatinine Clearance: 17.1 mL/min (A) (by C-G formula based on SCr of 2.45 mg/dL (H)). Liver Function Tests: Recent Labs  Lab 09/07/20 0058 09/12/20 1719  AST 21 22  ALT 13 12  ALKPHOS 49 58  BILITOT 0.3 0.8  PROT 7.3 7.9  ALBUMIN 3.5 3.9   No results for input(s): LIPASE, AMYLASE in the last 168 hours. No results for input(s): AMMONIA in the last 168 hours. Coagulation Profile: No results for input(s): INR, PROTIME in the last 168 hours. Cardiac Enzymes: No results for input(s): CKTOTAL, CKMB, CKMBINDEX, TROPONINI in the last 168 hours. BNP (last 3 results) No results for input(s): PROBNP in the last 8760 hours. HbA1C: Recent Labs    09/12/20 1329  HGBA1C 10.6*   CBG: Recent Labs  Lab 09/07/20 0557 09/07/20 1029 09/07/20 1216 09/12/20 1628 09/12/20 2346  GLUCAP 517* 496* 370* 411* 307*   Lipid Profile: No results for input(s): CHOL, HDL, LDLCALC, TRIG, CHOLHDL, LDLDIRECT in the last 72 hours. Thyroid Function Tests: Recent Labs    09/12/20 1339  TSH 2.90  FREET4 3.85*   Anemia Panel: No results for input(s): VITAMINB12, FOLATE, FERRITIN, TIBC, IRON, RETICCTPCT in the last 72 hours. Urine analysis:    Component Value Date/Time   COLORURINE YELLOW 09/12/2020 2000   APPEARANCEUR CLEAR 09/12/2020 2000   LABSPEC 1.012 09/12/2020 2000   LABSPEC 1.020 10/12/2018 1055   PHURINE 6.0 09/12/2020 2000   GLUCOSEU >=500 (A) 09/12/2020 2000   HGBUR NEGATIVE 09/12/2020 2000   BILIRUBINUR NEGATIVE 09/12/2020 2000   BILIRUBINUR negative 10/12/2018 1055   BILIRUBINUR n  07/01/2016 [place] 09/12/2020 2000   PROTEINUR NEGATIVE 09/12/2020 2000   UROBILINOGEN negative (A) 07/01/2016 1139   NITRITE NEGATIVE 09/12/2020 2000   LEUKOCYTESUR SMALL (A) 09/12/2020 2000    Radiological Exams on Admission - Personally Reviewed: DG Pelvis 1-2 Views  Result Date: 09/12/2020 CLINICAL DATA:  Fall EXAM: PELVIS - 1-2 VIEW COMPARISON:  05/27/2019 CT FINDINGS: There is no evidence of pelvic fracture or diastasis. No pelvic bone lesions are seen. IMPRESSION: Negative. Electronically Signed   By: Donavan Foil M.D.   On: 09/12/2020 17:24   CT HEAD WO CONTRAST (5MM)  Result Date: 09/12/2020 CLINICAL DATA:  Fall EXAM: CT HEAD WITHOUT CONTRAST TECHNIQUE: Contiguous axial images were obtained from the base of the skull through the vertex without intravenous contrast. COMPARISON:  CT 09/07/2020 FINDINGS: Brain: No acute territorial infarction, hemorrhage or intracranial mass. Chronic small vessel ischemic changes of the white matter. Chronic basal ganglial infarcts. Stable ventricle size Vascular: No hyperdense vessels.  Carotid vascular calcification Skull: Normal. Negative for fracture or focal lesion. Sinuses/Orbits: Mucosal thickening in the sinuses Other: Large posterior scalp hematoma IMPRESSION: 1. No CT evidence for acute intracranial abnormality. 2. Atrophy and chronic small vessel ischemic change of the white matter 3. Large posterior scalp hematoma Electronically Signed   By: Donavan Foil M.D.   On: 09/12/2020 19:15   CT Cervical Spine Wo Contrast  Result Date: 09/12/2020 CLINICAL DATA:  Unwitnessed fall EXAM: CT CERVICAL SPINE WITHOUT CONTRAST TECHNIQUE: Multidetector CT imaging of the cervical spine was performed without intravenous contrast. Multiplanar CT image reconstructions were also generated. COMPARISON:  Cervical radiograph 05/29/2011 FINDINGS: Alignment: Straightening of the cervical spine. Trace retrolisthesis C3 on C4. Facet alignment within normal  limits. Skull base and vertebrae: No acute fracture. No primary bone lesion or focal pathologic process. Soft tissues and spinal canal: No prevertebral fluid or swelling. No  visible canal hematoma. Disc levels: Moderate to marked degenerative change at C2-C3, C3-C4, C5-C6. Moderate degenerative change at C6-C7. Upper chest: Lung apices clear Other: None IMPRESSION: Straightening of cervical spine with trace retrolisthesis C3 on C4 likely degenerative. Multilevel degenerative change. No acute osseous abnormality. Electronically Signed   By: Donavan Foil M.D.   On: 09/12/2020 18:18   DG Chest Portable 1 View  Result Date: 09/12/2020 CLINICAL DATA:  Fall.  Syncope. EXAM: PORTABLE CHEST 1 VIEW COMPARISON:  Chest x-ray 09/07/2020 FINDINGS: The heart size and mediastinal contours are unchanged. No focal consolidation. No pulmonary edema. No pleural effusion. No pneumothorax. No acute osseous abnormality. IMPRESSION: No active disease. Electronically Signed   By: Iven Finn M.D.   On: 09/12/2020 17:15    EKG: Personally reviewed.  Rhythm is normal sinus rhythm with heart rate of 65 bpm.  No dynamic ST segment changes appreciated.  Assessment/Plan Principal Problem:   Syncope  Patient presenting with sudden episode of loss of consciousness and fall Patient presumably suffered from an episode of syncope after rising from a seated position based on limited history the patient is able to provide. Furthermore, patient is extremely volume depleted on my assessment [place] believe patient suffered from an episode of orthostatic syncope. Hydrating patient with intravenous isotonic fluids Monitoring patient on telemetry Echocardiogram planned for the morning PT evaluation Orthostatic vital signs tomorrow  Active Problems:    Acute metabolic encephalopathy  Patient has exhibited a several week history of increasing lethargy and intermittent confusion according to the daughter Patient seems to have had  profoundly elevated blood sugars over the same span of time On my evaluation, patient is extremely volume depleted, likely secondary to extremely uncontrolled diabetes.  This degree of volume depletion is likely contributing to patient's confusion. Hydrating patient aggressively as we additionally attempt to correct patient's hyperglycemia CT head unremarkable.  COVID-19 testing negative.  Urinalysis reveals no evidence of UTI.  Chest x-ray unremarkable. TSH, vitamin B12, folate, ammonia levels pending. If patient's mentation fails to improve with achieving hydration and normoglycemia then will expand work-up.    Uncontrolled type 2 diabetes mellitus with hyperglycemia, with long-term current use of insulin (HCC)  Significant hyperglycemia on arrival with blood sugars in the 400 range.  When patient arrived to the emergency department on 8/11 blood sugars were in the 500 range. This degree of hyperglycemia is likely leading the patient to be quite volume depleted.  Uncertain as whether or not patient is suffering hyperosmolar nonketotic state, serum osmolality pending Regardless, hydrating patient aggressively with intravenous isotonic fluids Initiating basal bolus insulin therapy.  Patient apparently was on NPH in the outpatient setting but this does not seem to have been effective. Placing patient on Levemir 15 units nightly with 4 units of NovoLog before every meal.  This regimen can be aggressively titrated as necessary Hemoglobin A1c performed on 8/16 was 10.6%.  Scalp hematoma  Supportive care, as needed Tylenol for pain.    Essential hypertension  Cautious continuation of antihypertensives while monitoring for episodic hypotension    COPD (chronic obstructive pulmonary disease) (HCC)  No clinical evidence of COPD exacerbation As needed bronchodilator therapy for shortness breath and wheezing.    Anemia due to chronic kidney disease  Hemoglobin and hematocrit are much higher than  her baseline further suggest that the patient is quite volume depleted    CKD (chronic kidney disease), stage IV ([place])  Chronic kidney disease has gradually progressed over approximately past year from stage III to  stage IV, likely secondary to diabetic nephropathy Strict input and output monitoring Monitoring renal function and electrolytes with serial chemistries Avoiding nephrotoxic agents if at all possible    Mixed hyperlipidemia due to type 2 diabetes mellitus ([place])  Continue home regimen of statin therapy   Code Status:  Full code Family Communication: Plan of care discussed with daughter via phone conversation  Status is: Observation  The patient remains OBS appropriate and will d/c before 2 midnights.  Dispo: The patient is from: Home              Anticipated d/c is to: Home              Patient currently is not medically stable to d/c.   Difficult to place patient No        Vernelle Emerald MD [facility] Pager [phone]  If 7PM-7AM, please contact night-coverage [email or web address] Use universal [place] password for that web site. If you do not have the password, please call the hospital operator.  09/13/2020, 12:10 AM

## 2020-09-13 NOTE — ED Notes (Signed)
Spoke with daughter who stated she is on her way for discharge.

## 2020-09-13 NOTE — ED Notes (Signed)
Dr Bonner Puna, "Ok, her baseline may be off from the concussion and hospitalization. Since it's improved with glycemic normalization, she can be discharged."

## 2020-09-14 ENCOUNTER — Telehealth: Payer: Self-pay

## 2020-09-14 NOTE — Telephone Encounter (Signed)
Transition Care Management Follow-up Telephone Call Date of discharge and from where: [facility]  09/13/20 How have you been since you were released from the hospital? No Any questions or concerns? No  Items Reviewed: Did the pt receive and understand the discharge instructions provided? Yes  Medications obtained and verified? Yes  Other? No  Any new allergies since your discharge? No  Dietary orders reviewed? No Do you have support at home? Yes    Follow up appointments reviewed:  PCP Hospital f/u appt confirmed? Yes  Scheduled to see [facility] on 09/19/20 @ 2:30. [name] f/u appt confirmed? No   Are transportation arrangements needed? No  If their condition worsens, is the pt aware to call PCP or go to the Emergency Dept.? Yes Was the patient provided with contact information for the PCP's office or ED? Yes Was to pt encouraged to call back with questions or concerns? Yes

## 2020-09-17 DIAGNOSIS — M810 Age-related osteoporosis without current pathological fracture: Secondary | ICD-10-CM | POA: Diagnosis not present

## 2020-09-17 DIAGNOSIS — E1165 Type 2 diabetes mellitus with hyperglycemia: Secondary | ICD-10-CM | POA: Diagnosis not present

## 2020-09-17 DIAGNOSIS — E1122 Type 2 diabetes mellitus with diabetic chronic kidney disease: Secondary | ICD-10-CM | POA: Diagnosis not present

## 2020-09-17 DIAGNOSIS — I2510 Atherosclerotic heart disease of native coronary artery without angina pectoris: Secondary | ICD-10-CM | POA: Diagnosis not present

## 2020-09-17 DIAGNOSIS — J449 Chronic obstructive pulmonary disease, unspecified: Secondary | ICD-10-CM | POA: Diagnosis not present

## 2020-09-17 DIAGNOSIS — D631 Anemia in chronic kidney disease: Secondary | ICD-10-CM | POA: Diagnosis not present

## 2020-09-17 DIAGNOSIS — M8580 Other specified disorders of bone density and structure, unspecified site: Secondary | ICD-10-CM | POA: Diagnosis not present

## 2020-09-17 DIAGNOSIS — N184 Chronic kidney disease, stage 4 (severe): Secondary | ICD-10-CM | POA: Diagnosis not present

## 2020-09-17 DIAGNOSIS — I129 Hypertensive chronic kidney disease with stage 1 through stage 4 chronic kidney disease, or unspecified chronic kidney disease: Secondary | ICD-10-CM | POA: Diagnosis not present

## 2020-09-18 ENCOUNTER — Inpatient Hospital Stay: Payer: Medicare HMO | Attending: Oncology

## 2020-09-18 DIAGNOSIS — D631 Anemia in chronic kidney disease: Secondary | ICD-10-CM | POA: Diagnosis not present

## 2020-09-18 DIAGNOSIS — J449 Chronic obstructive pulmonary disease, unspecified: Secondary | ICD-10-CM | POA: Diagnosis not present

## 2020-09-18 DIAGNOSIS — N184 Chronic kidney disease, stage 4 (severe): Secondary | ICD-10-CM | POA: Diagnosis not present

## 2020-09-18 DIAGNOSIS — I2510 Atherosclerotic heart disease of native coronary artery without angina pectoris: Secondary | ICD-10-CM | POA: Diagnosis not present

## 2020-09-18 DIAGNOSIS — E1122 Type 2 diabetes mellitus with diabetic chronic kidney disease: Secondary | ICD-10-CM | POA: Diagnosis not present

## 2020-09-18 DIAGNOSIS — M8580 Other specified disorders of bone density and structure, unspecified site: Secondary | ICD-10-CM | POA: Diagnosis not present

## 2020-09-18 DIAGNOSIS — M810 Age-related osteoporosis without current pathological fracture: Secondary | ICD-10-CM | POA: Diagnosis not present

## 2020-09-18 DIAGNOSIS — I129 Hypertensive chronic kidney disease with stage 1 through stage 4 chronic kidney disease, or unspecified chronic kidney disease: Secondary | ICD-10-CM | POA: Diagnosis not present

## 2020-09-18 DIAGNOSIS — E1165 Type 2 diabetes mellitus with hyperglycemia: Secondary | ICD-10-CM | POA: Diagnosis not present

## 2020-09-19 ENCOUNTER — Other Ambulatory Visit: Payer: Self-pay

## 2020-09-19 ENCOUNTER — Telehealth: Payer: Self-pay | Admitting: Medical

## 2020-09-19 ENCOUNTER — Ambulatory Visit (INDEPENDENT_AMBULATORY_CARE_PROVIDER_SITE_OTHER): Payer: Medicare HMO | Admitting: Medical

## 2020-09-19 VITALS — BP 120/70 | HR 68 | Wt 130.2 lb

## 2020-09-19 DIAGNOSIS — R946 Abnormal results of thyroid function studies: Secondary | ICD-10-CM

## 2020-09-19 DIAGNOSIS — E1165 Type 2 diabetes mellitus with hyperglycemia: Secondary | ICD-10-CM

## 2020-09-19 DIAGNOSIS — E871 Hypo-osmolality and hyponatremia: Secondary | ICD-10-CM | POA: Diagnosis not present

## 2020-09-19 DIAGNOSIS — I2510 Atherosclerotic heart disease of native coronary artery without angina pectoris: Secondary | ICD-10-CM | POA: Diagnosis not present

## 2020-09-19 DIAGNOSIS — D649 Anemia, unspecified: Secondary | ICD-10-CM

## 2020-09-19 DIAGNOSIS — Z794 Long term (current) use of insulin: Secondary | ICD-10-CM | POA: Diagnosis not present

## 2020-09-19 NOTE — Progress Notes (Signed)
Subjective:  Courtney Goodman is a 75 y.o. female who presents for Chief Complaint  Patient presents with   Hospitalization Follow-up    Hospital follow-up from high sugars- woozie and kind of out of it. She was put back on amlodipine but has kidney disease and was originally off of this.      Here for hospital follow-up.  Here with her daughter today.  She lives with her nephew but her daughters check on her regularly.  She has a Marine scientist aide that comes in 2 hours/day.  She is at home during the day by herself typically but she has supervision in the evenings.  She still handles her own affairs as far as finances.  She does not use the stove right now.  She has not driven since [age]  She was hospitalized August 16 through 17, 2022  She was hospitalized for high blood sugars, syncope, and active problems including hypertension, COPD, anemia, mixed hyperlipidemia, metabolic encephalopathy, CKD  Since coming home from the hospital she has had a little bit of a headache and she continues to have some soreness on the back of her head from where she fell and hit her head and had a concussion.  The swelling is gone down quite a bit.  She has some dizziness and sometimes gets confused.  Her daughter thinks she was not using her cane as she was supposed to be which may have contributed to the fall.  She sees endocrinology and does have follow-up pending with endocrinology.  Her blood sugars were in the 500s when she went to the hospital.  Physical therapy is planning to come out to the house since the hospitalization.  Regarding diabetes, she has some thirst and increased urination but she is drinking a lot more water since the hospital visit.  She notes that her kidney doctor had taken her off of amlodipine in recent months but the hospital added it back.  She is not sure if she needs to be on this  She would like a shower chair.  She has difficulty with bathing and does not feel particularly safe  in light of this recent fall.  She does have follow-up with Dr. Delice Lesch, neurology regarding memory and confusion and encephalopathy  Her daughter notes that she has lost some weight recently.  No recent change in appetite.  No fevers night sweats.       No other aggravating or relieving factors.    No other c/o.  Past Medical History:  Diagnosis Date   Anemia of chronic disease    Asthma    Atherosclerotic heart disease of native coronary artery without angina pectoris    CKD (chronic kidney disease)    COPD (chronic obstructive pulmonary disease) (HCC)    DM (diabetes mellitus), type 2 with renal complications (HCC)    Dysrhythmia    Elevated ferritin 01/03/2017   GAD (generalized anxiety disorder)    Hypertension    Mixed hyperlipidemia due to type 2 diabetes mellitus (HCC)    Mixed incontinence    per medical records from [facility]   Osteopenia    Personal history of noncompliance with medical treatment, presenting hazards to health    Shortness of breath dyspnea    Suicidal ideation    TB (tuberculosis), treated    age 8   Vitamin D deficiency    Current Outpatient Medications on File Prior to Visit  Medication Sig Dispense Refill   albuterol (PROVENTIL HFA;VENTOLIN HFA) 108 (90 Base) MCG/ACT  inhaler Inhale 2 puffs into the lungs every 6 (six) hours as needed for wheezing or shortness of breath. 1 Inhaler 0   Alcohol Swabs (ALCOHOL PREP) PADS Use for testing (Patient taking differently: 1 each by Other route 2 (two) times daily. E11.9) 100 each 1   amLODipine-benazepril (LOTREL) 5-20 MG capsule Take 1 capsule by mouth every morning.     aspirin EC 81 MG tablet Take 81 mg by mouth daily.      Biotin 10000 MCG TABS Take 1 tablet by mouth daily.      Blood Glucose Monitoring Suppl (TRUE METRIX METER) w/Device KIT 1 each by Does not apply route 2 (two) times daily. Use to monitor glucose levels BID; E11.29 1 kit 0   carvedilol (COREG) 6.25 MG tablet Take 1 tablet by mouth twice  daily. 180 tablet 0   Cholecalciferol (VITAMIN D3) 25 MCG (1000 UT) CAPS Take 1 capsule by mouth daily.     Cholecalciferol 25 MCG (1000 UT) tablet Take 1 tablet by mouth daily. 90 tablet 0   citalopram (CELEXA) 20 MG tablet Take 1 tablet (20 mg total) by mouth daily. 90 tablet 1   fenofibrate (TRICOR) 145 MG tablet Take 1 tablet by mouth daily. (Patient taking differently: Take 145 mg by mouth daily.) 90 tablet 0   fluticasone (FLONASE) 50 MCG/ACT nasal spray Place 2 sprays into both nostrils daily. 48 g 0   folic acid (FOLVITE) 1 MG tablet Take 1 tablet (1 mg total) by mouth daily. 90 tablet 0   Insulin NPH, Human,, Isophane, (NOVOLIN N FLEXPEN) 100 UNIT/ML Kiwkpen Inject 22 Units into the skin every morning. And pen needles 1/day 15 mL 11   Insulin Pen Needle (PEN NEEDLES) 32G X 5 MM MISC 1 each by Does not apply route daily. 100 each 3   liraglutide (VICTOZA) 18 MG/3ML SOPN Inject 1.2 mg into the skin every morning. 27 mL 0   mirtazapine (REMERON) 7.5 MG tablet Take 7.5 mg by mouth at bedtime.     omeprazole (PRILOSEC) 40 MG capsule TAKE 1 CAPSULE(40 MG) BY MOUTH DAILY (Patient taking differently: Take 40 mg by mouth daily.) 90 capsule 0   rivastigmine (EXELON) 4.6 mg/24hr Place 4.6 mg onto the skin daily.     simvastatin (ZOCOR) 20 MG tablet Take 1 tablet by mouth every night. (Patient taking differently: Take 20 mg by mouth daily at 6 PM.) 90 tablet 0   traZODone (DESYREL) 50 MG tablet Take 50 mg by mouth at bedtime.      TRUE METRIX BLOOD GLUCOSE TEST test strip TEST TWICE DAILY (Patient taking differently: 1 each by Other route 2 (two) times daily.) 150 strip 3   No current facility-administered medications on file prior to visit.     The following portions of the patient's history were reviewed and updated as appropriate: allergies, current medications, past family history, past medical history, past social history, past surgical history and problem list.  ROS Otherwise as in  subjective above  Objective: BP 120/70   Pulse 68   Wt 130 lb 3.2 oz (59.1 kg)   SpO2 98%   BMI 22.35 kg/m   General appearance: alert, no distress, well developed, well nourished, African-American female There is a small hematoma of the posterior superior scalp that is tender otherwise head nontender HEENT: sclerae anicteric, PERRLA, EOMi, conjunctiva pink and moist Neck: supple, no lymphadenopathy, no thyromegaly, no masses Heart: RRR, normal S1, S2, no murmurs Lungs: CTA bilaterally, no wheezes, rhonchi, or  rales Pulses: 2+ radial pulses, 2+ pedal pulses, normal cap refill Ext: no edema    Assessment: Encounter Diagnoses  Name Primary?   Uncontrolled type 2 diabetes mellitus with hyperglycemia, with long-term current use of insulin (HCC) Yes   Hyponatremia    Coronary artery disease involving native heart, unspecified vessel or lesion type, unspecified whether angina present    Anemia, unspecified type    Abnormal thyroid exam      Plan: I reviewed the hospital discharge summary.  Medicines reconciled.  I reviewed recent labs and imaging including CT head, CT cervical spine, x-rays of pelvis, chest x-ray.  No neck fracture, no intracranial bleed.  No pelvic fracture.  Also reviewed the notes from the hospital report.  I will write her prescription for a shower chair  Her 2 daughters and her nephew are all helping to watch after her.  She has a nurse aide that comes in 2 hours a day.  We will continue same plan for follow-up with neurology for encephalopathy, confusion, likely some memory loss  Continue plan for close follow-up with endocrinology soon  Labs today per hospital recommendations    Her daughter reports weight loss.  This could have been her due to the recent change in blood sugar acidotic state.  Continue to monitor.  Of note she does see hematology oncology regarding anemia so if we need him to help further evaluate, she is already a patient  there  Abnormal thyroid labs prior to hospitalization.  Recheck labs today  Zalma was seen today for hospitalization follow-up.  Diagnoses and all orders for this visit:  Uncontrolled type 2 diabetes mellitus with hyperglycemia, with long-term current use of insulin ([place]) -     Basic metabolic panel  Hyponatremia -     Basic metabolic panel  Coronary artery disease involving native heart, unspecified vessel or lesion type, unspecified whether angina present  Anemia, unspecified type -     Basic metabolic panel -     CBC  Abnormal thyroid exam -     T4, free -     T3    Follow up: pending labs

## 2020-09-19 NOTE — Telephone Encounter (Signed)
I can't tell looking at his chart May can you verify when her appointments are coming up with endocrinology and neurology?  The endocrinology follow-up needs to be soon.   However, the neurology follow-up can be within the next month or so but I cannot tell when her appointment with Dr. Delice Lesch  I will write a paper prescription for a shower chair.  This needs to be sent to home health

## 2020-09-20 DIAGNOSIS — M8580 Other specified disorders of bone density and structure, unspecified site: Secondary | ICD-10-CM | POA: Diagnosis not present

## 2020-09-20 DIAGNOSIS — J449 Chronic obstructive pulmonary disease, unspecified: Secondary | ICD-10-CM | POA: Diagnosis not present

## 2020-09-20 DIAGNOSIS — I129 Hypertensive chronic kidney disease with stage 1 through stage 4 chronic kidney disease, or unspecified chronic kidney disease: Secondary | ICD-10-CM | POA: Diagnosis not present

## 2020-09-20 DIAGNOSIS — D631 Anemia in chronic kidney disease: Secondary | ICD-10-CM | POA: Diagnosis not present

## 2020-09-20 DIAGNOSIS — M810 Age-related osteoporosis without current pathological fracture: Secondary | ICD-10-CM | POA: Diagnosis not present

## 2020-09-20 DIAGNOSIS — E1165 Type 2 diabetes mellitus with hyperglycemia: Secondary | ICD-10-CM | POA: Diagnosis not present

## 2020-09-20 DIAGNOSIS — N184 Chronic kidney disease, stage 4 (severe): Secondary | ICD-10-CM | POA: Diagnosis not present

## 2020-09-20 DIAGNOSIS — E1122 Type 2 diabetes mellitus with diabetic chronic kidney disease: Secondary | ICD-10-CM | POA: Diagnosis not present

## 2020-09-20 DIAGNOSIS — I2510 Atherosclerotic heart disease of native coronary artery without angina pectoris: Secondary | ICD-10-CM | POA: Diagnosis not present

## 2020-09-20 LAB — BASIC METABOLIC PANEL
BUN/Creatinine Ratio: 14 (ref 12–28)
BUN: 27 mg/dL (ref 8–27)
CO2: 21 mmol/L (ref 20–29)
Calcium: 9.7 mg/dL (ref 8.7–10.3)
Chloride: 95 mmol/L — ABNORMAL LOW (ref 96–106)
Creatinine, Ser: 1.99 mg/dL — ABNORMAL HIGH (ref 0.57–1.00)
Glucose: 554 mg/dL (ref 65–99)
Potassium: 4.4 mmol/L (ref 3.5–5.2)
Sodium: 131 mmol/L — ABNORMAL LOW (ref 134–144)
eGFR: 26 mL/min/{1.73_m2} — ABNORMAL LOW (ref >59)

## 2020-09-20 LAB — CBC
Hematocrit: 30.8 % — ABNORMAL LOW (ref 34.0–46.6)
Hemoglobin: 9.6 g/dL — ABNORMAL LOW (ref 11.1–15.9)
MCH: 29.4 pg (ref 26.6–33.0)
MCHC: 31.2 g/dL — ABNORMAL LOW (ref 31.5–35.7)
MCV: 95 fL (ref 79–97)
Platelets: 248 10*3/uL (ref 150–450)
RBC: 3.26 x10E6/uL — ABNORMAL LOW (ref 3.77–5.28)
RDW: 14 % (ref 11.7–15.4)
WBC: 6.7 10*3/uL (ref 3.4–10.8)

## 2020-09-20 LAB — T3: T3, Total: 74 ng/dL (ref 71–180)

## 2020-09-20 LAB — T4, FREE: Free T4: 1.32 ng/dL (ref 0.82–1.77)

## 2020-09-20 NOTE — Telephone Encounter (Signed)
Spoke to pt's daughter and she will call to schedule endo appointment. Neurology is scheduled for October but she will see if she can get it sooner if possible.   I will await for written order to send to a home health- [facility]

## 2020-09-20 NOTE — Telephone Encounter (Signed)
Called and left message on vm to call me back. Tried to call dana's number as well and VM is full

## 2020-09-21 ENCOUNTER — Ambulatory Visit: Payer: Medicare HMO | Admitting: Endocrinology

## 2020-09-21 NOTE — Progress Notes (Signed)
Pt scheduled for 09/22/20 7:30 am.

## 2020-09-21 NOTE — Telephone Encounter (Signed)
Pt's daughter did not take her to hospital. They tried to get her BS under control yesterday. It did go down to 249. Has not checked it today. She is taking Victoza and Novolin  daily. She was on twice daily insulin before she got switched by Dr. Loanne Drilling. I advised [name] to call Dr. Loanne Drilling and get in ASAP due to [facility] being high

## 2020-09-22 ENCOUNTER — Ambulatory Visit (INDEPENDENT_AMBULATORY_CARE_PROVIDER_SITE_OTHER): Payer: Medicare HMO | Admitting: Endocrinology

## 2020-09-22 ENCOUNTER — Other Ambulatory Visit: Payer: Self-pay

## 2020-09-22 VITALS — BP 124/52 | HR 74 | Ht 64.0 in | Wt 134.6 lb

## 2020-09-22 DIAGNOSIS — R531 Weakness: Secondary | ICD-10-CM | POA: Diagnosis not present

## 2020-09-22 DIAGNOSIS — E1165 Type 2 diabetes mellitus with hyperglycemia: Secondary | ICD-10-CM | POA: Diagnosis not present

## 2020-09-22 DIAGNOSIS — E1122 Type 2 diabetes mellitus with diabetic chronic kidney disease: Secondary | ICD-10-CM

## 2020-09-22 DIAGNOSIS — N183 Chronic kidney disease, stage 3 unspecified: Secondary | ICD-10-CM

## 2020-09-22 DIAGNOSIS — Z794 Long term (current) use of insulin: Secondary | ICD-10-CM | POA: Diagnosis not present

## 2020-09-22 DIAGNOSIS — D631 Anemia in chronic kidney disease: Secondary | ICD-10-CM | POA: Diagnosis not present

## 2020-09-22 DIAGNOSIS — I129 Hypertensive chronic kidney disease with stage 1 through stage 4 chronic kidney disease, or unspecified chronic kidney disease: Secondary | ICD-10-CM | POA: Diagnosis not present

## 2020-09-22 DIAGNOSIS — I2510 Atherosclerotic heart disease of native coronary artery without angina pectoris: Secondary | ICD-10-CM | POA: Diagnosis not present

## 2020-09-22 DIAGNOSIS — M8580 Other specified disorders of bone density and structure, unspecified site: Secondary | ICD-10-CM | POA: Diagnosis not present

## 2020-09-22 DIAGNOSIS — J449 Chronic obstructive pulmonary disease, unspecified: Secondary | ICD-10-CM | POA: Diagnosis not present

## 2020-09-22 DIAGNOSIS — N1831 Chronic kidney disease, stage 3a: Secondary | ICD-10-CM | POA: Diagnosis not present

## 2020-09-22 DIAGNOSIS — N184 Chronic kidney disease, stage 4 (severe): Secondary | ICD-10-CM | POA: Diagnosis not present

## 2020-09-22 DIAGNOSIS — M810 Age-related osteoporosis without current pathological fracture: Secondary | ICD-10-CM | POA: Diagnosis not present

## 2020-09-22 MED ORDER — VICTOZA 18 MG/3ML ~~LOC~~ SOPN: 0.6000 mg | PEN_INJECTOR | SUBCUTANEOUS | 0 refills | Status: DC

## 2020-09-22 MED ORDER — NOVOLIN N FLEXPEN 100 UNIT/ML ~~LOC~~ SUPN: 28.0000 [IU] | PEN_INJECTOR | SUBCUTANEOUS | 11 refills | Status: DC

## 2020-09-22 MED ORDER — ONETOUCH VERIO VI STRP: 1.0000 | ORAL_STRIP | Freq: Two times a day (BID) | 12 refills | Status: DC

## 2020-09-22 MED ORDER — ONETOUCH VERIO W/DEVICE KIT: 1.0000 | PACK | Freq: Once | 0 refills | Status: AC

## 2020-09-22 NOTE — Patient Instructions (Addendum)
I have sent 2 prescription to your pharmacy: to increase the insulin again, and to reduce the Victoza again.  Please call or message [place] next week, to tell us how the blood sugar is doing.   check your blood sugar twice a day.  vary the time of day when you check, between before the 3 meals, and at bedtime.  also check if you have symptoms of your blood sugar being too high or too low.  please keep a record of the readings and bring it to your next appointment here (or you can bring the meter itself).  You can write it on any piece of paper.  please call us sooner if your blood sugar goes below 70, or if most of your readings are over 200. Please come back for a follow-up appointment as scheduled.

## 2020-09-22 NOTE — Progress Notes (Signed)
Subjective:    Patient ID: Courtney Goodman, female    DOB: [birth year], 75 y.o.   MRN: [id number]  HPI Pt returns for f/u of diabetes mellitus:  DM type: Insulin-requiring type 2 Dx'ed: 8882 Complications: stage 4 CRI and CAD.  Therapy: insulin since 2009, and Victoza.  GDM: never DKA: never Severe hypoglycemia: 2012, 2019, 2021, and 2022.   Pancreatitis: never.  SDOH: ins declined tresiba; she lives alone--a caretaker puts insulin out for pt, but she takes herself in the morning, when she is alone.   Other: she is on qd insulin, due to h/o noncompliance Interval history: Pt is here today with dtr [name].  Pt was seen in ER and then PCP, for severe hyperglycemia.  Victoza is 1.2 mg/d.  Insulin is 22 units qam.  Cbg's vary from 249-339.  [name] is observing pt taking the insulin qam.   Past Medical History:  Diagnosis Date   Anemia of chronic disease    Asthma    Atherosclerotic heart disease of native coronary artery without angina pectoris    CKD (chronic kidney disease)    COPD (chronic obstructive pulmonary disease) (HCC)    DM (diabetes mellitus), type 2 with renal complications (HCC)    Dysrhythmia    Elevated ferritin 01/03/2017   GAD (generalized anxiety disorder)    Hypertension    Mixed hyperlipidemia due to type 2 diabetes mellitus (HCC)    Mixed incontinence    per medical records from [facility]   Osteopenia    Personal history of noncompliance with medical treatment, presenting hazards to health    Shortness of breath dyspnea    Suicidal ideation    TB (tuberculosis), treated    age 60   Vitamin D deficiency     Past Surgical History:  Procedure Laterality Date   ANKLE RECONSTRUCTION     right   CARPAL TUNNEL RELEASE     CORONARY ANGIOPLASTY      Social History   Socioeconomic History   Marital status: Divorced    Spouse name: Not on file   Number of children: 2   Years of education: 12   Highest education level: High school graduate  Occupational History    Occupation: Retired  Tobacco Use   Smoking status: Former   Smokeless tobacco: Never  Scientific laboratory technician Use: Never used  Substance and Sexual Activity   Alcohol use: No   Drug use: No   Sexual activity: Never  Other Topics Concern   Not on file  Social History Narrative   Lives alone    Left handed   Social Determinants of Health   Financial Resource Strain: Medium Risk   Difficulty of Paying Living Expenses: Somewhat hard  Food Insecurity: No Food Insecurity   Worried About Charity fundraiser in the Last Year: Never true   Ran Out of Food in the Last Year: Never true  Transportation Needs: No Transportation Needs   Lack of Transportation (Medical): No   Lack of Transportation (Non-Medical): No  Physical Activity: Inactive   Days of Exercise per Week: 0 days   Minutes of Exercise per Session: 0 min  Stress: Stress Concern Present   Feeling of Stress : To some extent  Social Connections: Moderately Isolated   Frequency of Communication with Friends and Family: More than three times a week   Frequency of Social Gatherings with Friends and Family: More than three times a week   Attends Religious Services: More than  4 times per year   Active Member of Clubs or Organizations: No   Attends Archivist Meetings: Never   Marital Status: Divorced  Human resources officer Violence: Not At Risk   Fear of Current or Ex-Partner: No   Emotionally Abused: No   Physically Abused: No   Sexually Abused: No    Current Outpatient Medications on File Prior to Visit  Medication Sig Dispense Refill   albuterol (PROVENTIL HFA;VENTOLIN HFA) 108 (90 Base) MCG/ACT inhaler Inhale 2 puffs into the lungs every 6 (six) hours as needed for wheezing or shortness of breath. 1 Inhaler 0   Alcohol Swabs (ALCOHOL PREP) PADS Use for testing (Patient taking differently: 1 each by Other route 2 (two) times daily. E11.9) 100 each 1   amLODipine-benazepril (LOTREL) 5-20 MG capsule Take 1 capsule by  mouth every morning.     aspirin EC 81 MG tablet Take 81 mg by mouth daily.      Biotin 10000 MCG TABS Take 1 tablet by mouth daily.      carvedilol (COREG) 6.25 MG tablet Take 1 tablet by mouth twice daily. 180 tablet 0   Cholecalciferol (VITAMIN D3) 25 MCG (1000 UT) CAPS Take 1 capsule by mouth daily.     Cholecalciferol 25 MCG (1000 UT) tablet Take 1 tablet by mouth daily. 90 tablet 0   citalopram (CELEXA) 20 MG tablet Take 1 tablet (20 mg total) by mouth daily. 90 tablet 1   fenofibrate (TRICOR) 145 MG tablet Take 1 tablet by mouth daily. (Patient taking differently: Take 145 mg by mouth daily.) 90 tablet 0   fluticasone (FLONASE) 50 MCG/ACT nasal spray Place 2 sprays into both nostrils daily. 48 g 0   folic acid (FOLVITE) 1 MG tablet Take 1 tablet (1 mg total) by mouth daily. 90 tablet 0   Insulin Pen Needle (PEN NEEDLES) 32G X 5 MM MISC 1 each by Does not apply route daily. 100 each 3   mirtazapine (REMERON) 7.5 MG tablet Take 7.5 mg by mouth at bedtime.     omeprazole (PRILOSEC) 40 MG capsule TAKE 1 CAPSULE(40 MG) BY MOUTH DAILY (Patient taking differently: Take 40 mg by mouth daily.) 90 capsule 0   rivastigmine (EXELON) 4.6 mg/24hr Place 4.6 mg onto the skin daily.     simvastatin (ZOCOR) 20 MG tablet Take 1 tablet by mouth every night. (Patient taking differently: Take 20 mg by mouth daily at 6 PM.) 90 tablet 0   traZODone (DESYREL) 50 MG tablet Take 50 mg by mouth at bedtime.      No current facility-administered medications on file prior to visit.    No Known Allergies  Family History  Problem Relation Age of Onset   Diabetes Mother     BP (!) 124/52 (BP Location: Right Arm, Patient Position: Sitting, Cuff Size: Normal)   Pulse 74   Ht 5\' 4"  (1.626 m)   Wt 134 lb 9.6 oz (61.1 kg)   SpO2 98%   BMI 23.10 kg/m    Review of Systems She has nausea but no vomiting.  No weight change.     Objective:   Physical Exam    Lab Results  Component Value Date   CREATININE  1.99 (H) 09/19/2020   BUN 27 09/19/2020   NA 131 (L) 09/19/2020   K 4.4 09/19/2020   CL 95 (L) 09/19/2020   CO2 21 09/19/2020      Assessment & Plan:  Insulin-requiring type 2 DM: uncontrolled Weight loss, stablized.  Patient Instructions  I have sent 2 prescription to your pharmacy: to increase the insulin again, and to reduce the Victoza again.  Please call or message [place] next week, to tell us how the blood sugar is doing.   check your blood sugar twice a day.  vary the time of day when you check, between before the 3 meals, and at bedtime.  also check if you have symptoms of your blood sugar being too high or too low.  please keep a record of the readings and bring it to your next appointment here (or you can bring the meter itself).  You can write it on any piece of paper.  please call us sooner if your blood sugar goes below 70, or if most of your readings are over 200. Please come back for a follow-up appointment as scheduled.

## 2020-09-23 DIAGNOSIS — E1122 Type 2 diabetes mellitus with diabetic chronic kidney disease: Secondary | ICD-10-CM | POA: Diagnosis not present

## 2020-09-23 DIAGNOSIS — I129 Hypertensive chronic kidney disease with stage 1 through stage 4 chronic kidney disease, or unspecified chronic kidney disease: Secondary | ICD-10-CM | POA: Diagnosis not present

## 2020-09-23 DIAGNOSIS — M810 Age-related osteoporosis without current pathological fracture: Secondary | ICD-10-CM | POA: Diagnosis not present

## 2020-09-23 DIAGNOSIS — D631 Anemia in chronic kidney disease: Secondary | ICD-10-CM | POA: Diagnosis not present

## 2020-09-23 DIAGNOSIS — N184 Chronic kidney disease, stage 4 (severe): Secondary | ICD-10-CM | POA: Diagnosis not present

## 2020-09-23 DIAGNOSIS — I2510 Atherosclerotic heart disease of native coronary artery without angina pectoris: Secondary | ICD-10-CM | POA: Diagnosis not present

## 2020-09-23 DIAGNOSIS — E1165 Type 2 diabetes mellitus with hyperglycemia: Secondary | ICD-10-CM | POA: Diagnosis not present

## 2020-09-23 DIAGNOSIS — M8580 Other specified disorders of bone density and structure, unspecified site: Secondary | ICD-10-CM | POA: Diagnosis not present

## 2020-09-23 DIAGNOSIS — J449 Chronic obstructive pulmonary disease, unspecified: Secondary | ICD-10-CM | POA: Diagnosis not present

## 2020-09-25 ENCOUNTER — Other Ambulatory Visit: Payer: Self-pay

## 2020-09-25 ENCOUNTER — Telehealth: Payer: Self-pay | Admitting: Family Medicine

## 2020-09-25 ENCOUNTER — Emergency Department (HOSPITAL_COMMUNITY)
Admission: EM | Admit: 2020-09-25 | Discharge: 2020-09-26 | Disposition: A | Discharge status: Home / Self Care | Payer: Medicare HMO | Attending: Student | Admitting: Student

## 2020-09-25 ENCOUNTER — Emergency Department (HOSPITAL_COMMUNITY): Payer: Medicare HMO

## 2020-09-25 DIAGNOSIS — I129 Hypertensive chronic kidney disease with stage 1 through stage 4 chronic kidney disease, or unspecified chronic kidney disease: Secondary | ICD-10-CM | POA: Diagnosis not present

## 2020-09-25 DIAGNOSIS — W109XXA Fall (on) (from) unspecified stairs and steps, initial encounter: Secondary | ICD-10-CM | POA: Diagnosis not present

## 2020-09-25 DIAGNOSIS — W19XXXA Unspecified fall, initial encounter: Secondary | ICD-10-CM | POA: Diagnosis not present

## 2020-09-25 DIAGNOSIS — N184 Chronic kidney disease, stage 4 (severe): Secondary | ICD-10-CM | POA: Diagnosis not present

## 2020-09-25 DIAGNOSIS — R42 Dizziness and giddiness: Secondary | ICD-10-CM | POA: Diagnosis not present

## 2020-09-25 DIAGNOSIS — S0990XA Unspecified injury of head, initial encounter: Secondary | ICD-10-CM | POA: Diagnosis present

## 2020-09-25 DIAGNOSIS — R7309 Other abnormal glucose: Secondary | ICD-10-CM | POA: Diagnosis not present

## 2020-09-25 DIAGNOSIS — I6381 Other cerebral infarction due to occlusion or stenosis of small artery: Secondary | ICD-10-CM | POA: Diagnosis not present

## 2020-09-25 DIAGNOSIS — M8580 Other specified disorders of bone density and structure, unspecified site: Secondary | ICD-10-CM | POA: Diagnosis not present

## 2020-09-25 DIAGNOSIS — D631 Anemia in chronic kidney disease: Secondary | ICD-10-CM | POA: Diagnosis not present

## 2020-09-25 DIAGNOSIS — I2510 Atherosclerotic heart disease of native coronary artery without angina pectoris: Secondary | ICD-10-CM | POA: Diagnosis not present

## 2020-09-25 DIAGNOSIS — M810 Age-related osteoporosis without current pathological fracture: Secondary | ICD-10-CM | POA: Diagnosis not present

## 2020-09-25 DIAGNOSIS — E1122 Type 2 diabetes mellitus with diabetic chronic kidney disease: Secondary | ICD-10-CM | POA: Diagnosis not present

## 2020-09-25 DIAGNOSIS — G319 Degenerative disease of nervous system, unspecified: Secondary | ICD-10-CM | POA: Diagnosis not present

## 2020-09-25 DIAGNOSIS — S0083XA Contusion of other part of head, initial encounter: Secondary | ICD-10-CM | POA: Insufficient documentation

## 2020-09-25 DIAGNOSIS — R519 Headache, unspecified: Secondary | ICD-10-CM | POA: Diagnosis not present

## 2020-09-25 DIAGNOSIS — J320 Chronic maxillary sinusitis: Secondary | ICD-10-CM | POA: Diagnosis not present

## 2020-09-25 DIAGNOSIS — J449 Chronic obstructive pulmonary disease, unspecified: Secondary | ICD-10-CM | POA: Diagnosis not present

## 2020-09-25 DIAGNOSIS — R531 Weakness: Secondary | ICD-10-CM | POA: Diagnosis not present

## 2020-09-25 DIAGNOSIS — Z5321 Procedure and treatment not carried out due to patient leaving prior to being seen by health care provider: Secondary | ICD-10-CM | POA: Diagnosis not present

## 2020-09-25 DIAGNOSIS — E1165 Type 2 diabetes mellitus with hyperglycemia: Secondary | ICD-10-CM | POA: Diagnosis not present

## 2020-09-25 DIAGNOSIS — R296 Repeated falls: Secondary | ICD-10-CM | POA: Diagnosis not present

## 2020-09-25 LAB — CBC WITH DIFFERENTIAL/PLATELET
Abs Immature Granulocytes: 0.05 K/uL (ref 0.00–0.07)
Basophils Absolute: 0 K/uL (ref 0.0–0.1)
Basophils Relative: 0 %
Eosinophils Absolute: 0.1 K/uL (ref 0.0–0.5)
Eosinophils Relative: 1 %
HCT: 29.6 % — ABNORMAL LOW (ref 36.0–46.0)
Hemoglobin: 9.7 g/dL — ABNORMAL LOW (ref 12.0–15.0)
Immature Granulocytes: 1 %
Lymphocytes Relative: 24 %
Lymphs Abs: 1.7 K/uL (ref 0.7–4.0)
MCH: 30.7 pg (ref 26.0–34.0)
MCHC: 32.8 g/dL (ref 30.0–36.0)
MCV: 93.7 fL (ref 80.0–100.0)
Monocytes Absolute: 0.5 K/uL (ref 0.1–1.0)
Monocytes Relative: 7 %
Neutro Abs: 4.8 K/uL (ref 1.7–7.7)
Neutrophils Relative %: 67 %
Platelets: 271 K/uL (ref 150–400)
RBC: 3.16 MIL/uL — ABNORMAL LOW (ref 3.87–5.11)
RDW: 15.4 % (ref 11.5–15.5)
WBC: 7.1 K/uL (ref 4.0–10.5)
nRBC: 0 % (ref 0.0–0.2)

## 2020-09-25 LAB — COMPREHENSIVE METABOLIC PANEL WITH GFR
ALT: 12 U/L (ref 0–44)
AST: 19 U/L (ref 15–41)
Albumin: 3.3 g/dL — ABNORMAL LOW (ref 3.5–5.0)
Alkaline Phosphatase: 57 U/L (ref 38–126)
Anion gap: 5 (ref 5–15)
BUN: 33 mg/dL — ABNORMAL HIGH (ref 8–23)
CO2: 28 mmol/L (ref 22–32)
Calcium: 9.8 mg/dL (ref 8.9–10.3)
Chloride: 103 mmol/L (ref 98–111)
Creatinine, Ser: 2.53 mg/dL — ABNORMAL HIGH (ref 0.44–1.00)
GFR, Estimated: 19 mL/min — ABNORMAL LOW
Glucose, Bld: 78 mg/dL (ref 70–99)
Potassium: 4.1 mmol/L (ref 3.5–5.1)
Sodium: 136 mmol/L (ref 135–145)
Total Bilirubin: 0.5 mg/dL (ref 0.3–1.2)
Total Protein: 7.1 g/dL (ref 6.5–8.1)

## 2020-09-25 LAB — CBG MONITORING, ED: Glucose-Capillary: 105 mg/dL — ABNORMAL HIGH (ref 70–99)

## 2020-09-25 NOTE — Telephone Encounter (Signed)
Social worker from [place] called and wants verbal orders to see pt. She is having trouble with food and she wants to help her.

## 2020-09-25 NOTE — ED Notes (Addendum)
Pt encouraged to stay  Pt left AMA with daughter

## 2020-09-25 NOTE — ED Triage Notes (Signed)
Pt from home via EMS was found at the bottom of four stairs. Endorses "losing her balance" at the top of the stairs and falling down. Denies LOC or blood thinners. Increased falls over the last few weeks. Hematoma to back of head, denies neck or extremity pain.

## 2020-09-25 NOTE — Telephone Encounter (Signed)
This was sent to vickie by mistake since she isn't here today

## 2020-09-25 NOTE — ED Notes (Signed)
Call daug. With an update

## 2020-09-25 NOTE — ED Provider Notes (Signed)
Emergency Medicine Provider Triage Evaluation Note  Courtney Goodman , a 75 y.o. female  was evaluated in triage.  Pt complains of a fall onto concrete. Fell backwards and hit head. Denies LOC or thinners. Has fallen multiple times this week.    Review of Systems  Positive: Fall, headache Negative: Visual changes, NV  Physical Exam  BP 121/79 (BP Location: Right Arm)   Pulse 77   Temp 99.2 F (37.3 C) (Oral)   Resp 15   SpO2 99%  Gen:   Awake, no distress   Resp:  Normal effort  MSK:   Moves extremities without difficulty  Other:  Tender area to back of the head.   Medical Decision Making  Medically screening exam initiated at 6:42 PM.  Appropriate orders placed.  Chonte Ricke Humphrey was informed that the remainder of the evaluation will be completed by another provider, this initial triage assessment does not replace that evaluation, and the importance of remaining in the ED until their evaluation is complete.     Darliss Ridgel 09/25/20 1854    Teressa Lower, MD 09/26/20 0040

## 2020-09-26 ENCOUNTER — Ambulatory Visit: Payer: Medicare HMO | Admitting: Podiatry

## 2020-09-26 ENCOUNTER — Telehealth: Payer: Self-pay | Admitting: Family Medicine

## 2020-09-26 DIAGNOSIS — I129 Hypertensive chronic kidney disease with stage 1 through stage 4 chronic kidney disease, or unspecified chronic kidney disease: Secondary | ICD-10-CM | POA: Diagnosis not present

## 2020-09-26 DIAGNOSIS — N184 Chronic kidney disease, stage 4 (severe): Secondary | ICD-10-CM | POA: Diagnosis not present

## 2020-09-26 DIAGNOSIS — D631 Anemia in chronic kidney disease: Secondary | ICD-10-CM | POA: Diagnosis not present

## 2020-09-26 DIAGNOSIS — E1122 Type 2 diabetes mellitus with diabetic chronic kidney disease: Secondary | ICD-10-CM | POA: Diagnosis not present

## 2020-09-26 DIAGNOSIS — M8580 Other specified disorders of bone density and structure, unspecified site: Secondary | ICD-10-CM | POA: Diagnosis not present

## 2020-09-26 DIAGNOSIS — I2510 Atherosclerotic heart disease of native coronary artery without angina pectoris: Secondary | ICD-10-CM | POA: Diagnosis not present

## 2020-09-26 DIAGNOSIS — J449 Chronic obstructive pulmonary disease, unspecified: Secondary | ICD-10-CM | POA: Diagnosis not present

## 2020-09-26 DIAGNOSIS — M810 Age-related osteoporosis without current pathological fracture: Secondary | ICD-10-CM | POA: Diagnosis not present

## 2020-09-26 DIAGNOSIS — E1165 Type 2 diabetes mellitus with hyperglycemia: Secondary | ICD-10-CM | POA: Diagnosis not present

## 2020-09-26 NOTE — Telephone Encounter (Signed)
Call pt and left a message stating we had heard she had taken a fall and just wanted to check on her. I also stated that to please call the office if further assisted was needed.

## 2020-09-26 NOTE — Telephone Encounter (Signed)
Received a call from [facility] with [facility]. She states that pt fell yesterday chasing an [employer] truck and EMS was call and pt was taken to ER. Pt left because she didn't want to wait. Princess saw pt to day and pt has no visible injuries and all vitals are stable. Pt is complaining of a slight headache. Pt is being cared for by her daughter. Pt declined per daughter to make an appt and states she is fine. Princess can be reached at [phone].

## 2020-09-26 NOTE — Telephone Encounter (Signed)
That is fine. There is no number for Education officer, museum

## 2020-09-27 ENCOUNTER — Other Ambulatory Visit: Payer: Self-pay | Admitting: Endocrinology

## 2020-09-27 DIAGNOSIS — N184 Chronic kidney disease, stage 4 (severe): Secondary | ICD-10-CM | POA: Diagnosis not present

## 2020-09-27 DIAGNOSIS — M8580 Other specified disorders of bone density and structure, unspecified site: Secondary | ICD-10-CM | POA: Diagnosis not present

## 2020-09-27 DIAGNOSIS — E1165 Type 2 diabetes mellitus with hyperglycemia: Secondary | ICD-10-CM | POA: Diagnosis not present

## 2020-09-27 DIAGNOSIS — J449 Chronic obstructive pulmonary disease, unspecified: Secondary | ICD-10-CM | POA: Diagnosis not present

## 2020-09-27 DIAGNOSIS — I2510 Atherosclerotic heart disease of native coronary artery without angina pectoris: Secondary | ICD-10-CM | POA: Diagnosis not present

## 2020-09-27 DIAGNOSIS — M810 Age-related osteoporosis without current pathological fracture: Secondary | ICD-10-CM | POA: Diagnosis not present

## 2020-09-27 DIAGNOSIS — D631 Anemia in chronic kidney disease: Secondary | ICD-10-CM | POA: Diagnosis not present

## 2020-09-27 DIAGNOSIS — I129 Hypertensive chronic kidney disease with stage 1 through stage 4 chronic kidney disease, or unspecified chronic kidney disease: Secondary | ICD-10-CM | POA: Diagnosis not present

## 2020-09-27 DIAGNOSIS — E1122 Type 2 diabetes mellitus with diabetic chronic kidney disease: Secondary | ICD-10-CM | POA: Diagnosis not present

## 2020-09-27 NOTE — Telephone Encounter (Signed)
  Notes to clinic:  Medication filled by a historical provider Patient was seen on 09/22/2020 Review for refills   Requested Prescriptions  Pending Prescriptions Disp Refills   fenofibrate (TRICOR) 145 MG tablet [Pharmacy Med Name: fenofibrate nanocrystallized 145 mg tablet] 30 tablet 5    Sig: TAKE ONE TABLET BY MOUTH ONCE DAILY     There is no refill protocol information for this order     citalopram (CELEXA) 20 MG tablet [Pharmacy Med Name: citalopram 20 mg tablet] 30 tablet 5    Sig: TAKE ONE TABLET BY MOUTH EVERYDAY AT BEDTIME     There is no refill protocol information for this order     Cholecalciferol (VITAMIN D3) 25 MCG (1000 UT) CAPS [Pharmacy Med Name: cholecalciferol (vitamin D3) 25 mcg (1,000 unit) capsule] 30 capsule 5    Sig: TAKE ONE CAPSULE BY MOUTH EVERYDAY AT BEDTIME     There is no refill protocol information for this order     simvastatin (ZOCOR) 20 MG tablet [Pharmacy Med Name: simvastatin 20 mg tablet] 30 tablet 5    Sig: TAKE ONE TABLET BY MOUTH EVERYDAY AT BEDTIME     There is no refill protocol information for this order     traZODone (DESYREL) 50 MG tablet [Pharmacy Med Name: trazodone 50 mg tablet] 30 tablet 5    Sig: TAKE ONE TABLET BY MOUTH EVERYDAY AT BEDTIME     There is no refill protocol information for this order     aspirin 81 MG chewable tablet [Pharmacy Med Name: aspirin 81 mg chewable tablet] 30 tablet 5    Sig: TAKE ONE TABLET BY MOUTH EVERY MORNING     There is no refill protocol information for this order     BIOTIN MAXIMUM STRENGTH 10000 MCG TABS [Pharmacy Med Name: biotin 10 mg tablet] 30 tablet 5    Sig: TAKE ONE TABLET BY MOUTH ONCE DAILY     There is no refill protocol information for this order     carvedilol (COREG) 6.25 MG tablet [Pharmacy Med Name: carvedilol 6.25 mg tablet] 60 tablet 5    Sig: TAKE ONE TABLET BY MOUTH EVERY MORNING and TAKE ONE TABLET BY MOUTH EVERYDAY AT BEDTIME     There is no refill protocol  information for this order     mirtazapine (REMERON) 7.5 MG tablet [Pharmacy Med Name: mirtazapine 7.5 mg tablet] 30 tablet 5    Sig: TAKE ONE TABLET BY MOUTH EVERYDAY AT BEDTIME     There is no refill protocol information for this order     folic acid (FOLVITE) 1 MG tablet [Pharmacy Med Name: folic acid 1 mg tablet] 30 tablet 5    Sig: TAKE ONE TABLET BY MOUTH EVERY MORNING     There is no refill protocol information for this order

## 2020-09-28 DIAGNOSIS — N184 Chronic kidney disease, stage 4 (severe): Secondary | ICD-10-CM | POA: Diagnosis not present

## 2020-09-28 DIAGNOSIS — R413 Other amnesia: Secondary | ICD-10-CM | POA: Diagnosis not present

## 2020-09-28 DIAGNOSIS — I129 Hypertensive chronic kidney disease with stage 1 through stage 4 chronic kidney disease, or unspecified chronic kidney disease: Secondary | ICD-10-CM | POA: Diagnosis not present

## 2020-09-28 DIAGNOSIS — E1122 Type 2 diabetes mellitus with diabetic chronic kidney disease: Secondary | ICD-10-CM | POA: Diagnosis not present

## 2020-09-28 DIAGNOSIS — D649 Anemia, unspecified: Secondary | ICD-10-CM | POA: Diagnosis not present

## 2020-09-29 ENCOUNTER — Other Ambulatory Visit: Payer: Self-pay | Admitting: Family Medicine

## 2020-10-02 DIAGNOSIS — M8580 Other specified disorders of bone density and structure, unspecified site: Secondary | ICD-10-CM | POA: Diagnosis not present

## 2020-10-02 DIAGNOSIS — I2510 Atherosclerotic heart disease of native coronary artery without angina pectoris: Secondary | ICD-10-CM | POA: Diagnosis not present

## 2020-10-02 DIAGNOSIS — E1165 Type 2 diabetes mellitus with hyperglycemia: Secondary | ICD-10-CM | POA: Diagnosis not present

## 2020-10-02 DIAGNOSIS — I129 Hypertensive chronic kidney disease with stage 1 through stage 4 chronic kidney disease, or unspecified chronic kidney disease: Secondary | ICD-10-CM | POA: Diagnosis not present

## 2020-10-02 DIAGNOSIS — N184 Chronic kidney disease, stage 4 (severe): Secondary | ICD-10-CM | POA: Diagnosis not present

## 2020-10-02 DIAGNOSIS — E1122 Type 2 diabetes mellitus with diabetic chronic kidney disease: Secondary | ICD-10-CM | POA: Diagnosis not present

## 2020-10-02 DIAGNOSIS — M810 Age-related osteoporosis without current pathological fracture: Secondary | ICD-10-CM | POA: Diagnosis not present

## 2020-10-02 DIAGNOSIS — D631 Anemia in chronic kidney disease: Secondary | ICD-10-CM | POA: Diagnosis not present

## 2020-10-02 DIAGNOSIS — J449 Chronic obstructive pulmonary disease, unspecified: Secondary | ICD-10-CM | POA: Diagnosis not present

## 2020-10-03 ENCOUNTER — Inpatient Hospital Stay: Payer: Medicare HMO | Attending: Oncology

## 2020-10-04 DIAGNOSIS — M8580 Other specified disorders of bone density and structure, unspecified site: Secondary | ICD-10-CM | POA: Diagnosis not present

## 2020-10-04 DIAGNOSIS — J449 Chronic obstructive pulmonary disease, unspecified: Secondary | ICD-10-CM | POA: Diagnosis not present

## 2020-10-04 DIAGNOSIS — M810 Age-related osteoporosis without current pathological fracture: Secondary | ICD-10-CM | POA: Diagnosis not present

## 2020-10-04 DIAGNOSIS — E1122 Type 2 diabetes mellitus with diabetic chronic kidney disease: Secondary | ICD-10-CM | POA: Diagnosis not present

## 2020-10-04 DIAGNOSIS — E1165 Type 2 diabetes mellitus with hyperglycemia: Secondary | ICD-10-CM | POA: Diagnosis not present

## 2020-10-04 DIAGNOSIS — I2510 Atherosclerotic heart disease of native coronary artery without angina pectoris: Secondary | ICD-10-CM | POA: Diagnosis not present

## 2020-10-04 DIAGNOSIS — I129 Hypertensive chronic kidney disease with stage 1 through stage 4 chronic kidney disease, or unspecified chronic kidney disease: Secondary | ICD-10-CM | POA: Diagnosis not present

## 2020-10-04 DIAGNOSIS — D631 Anemia in chronic kidney disease: Secondary | ICD-10-CM | POA: Diagnosis not present

## 2020-10-04 DIAGNOSIS — N184 Chronic kidney disease, stage 4 (severe): Secondary | ICD-10-CM | POA: Diagnosis not present

## 2020-10-06 DIAGNOSIS — N184 Chronic kidney disease, stage 4 (severe): Secondary | ICD-10-CM | POA: Diagnosis not present

## 2020-10-06 DIAGNOSIS — E1165 Type 2 diabetes mellitus with hyperglycemia: Secondary | ICD-10-CM | POA: Diagnosis not present

## 2020-10-06 DIAGNOSIS — I2510 Atherosclerotic heart disease of native coronary artery without angina pectoris: Secondary | ICD-10-CM | POA: Diagnosis not present

## 2020-10-06 DIAGNOSIS — I129 Hypertensive chronic kidney disease with stage 1 through stage 4 chronic kidney disease, or unspecified chronic kidney disease: Secondary | ICD-10-CM | POA: Diagnosis not present

## 2020-10-06 DIAGNOSIS — M8580 Other specified disorders of bone density and structure, unspecified site: Secondary | ICD-10-CM | POA: Diagnosis not present

## 2020-10-06 DIAGNOSIS — E1122 Type 2 diabetes mellitus with diabetic chronic kidney disease: Secondary | ICD-10-CM | POA: Diagnosis not present

## 2020-10-06 DIAGNOSIS — M810 Age-related osteoporosis without current pathological fracture: Secondary | ICD-10-CM | POA: Diagnosis not present

## 2020-10-06 DIAGNOSIS — D631 Anemia in chronic kidney disease: Secondary | ICD-10-CM | POA: Diagnosis not present

## 2020-10-06 DIAGNOSIS — J449 Chronic obstructive pulmonary disease, unspecified: Secondary | ICD-10-CM | POA: Diagnosis not present

## 2020-10-09 ENCOUNTER — Telehealth: Payer: Self-pay

## 2020-10-09 ENCOUNTER — Telehealth: Payer: Self-pay | Admitting: Family Medicine

## 2020-10-09 ENCOUNTER — Other Ambulatory Visit: Payer: Self-pay

## 2020-10-09 DIAGNOSIS — N184 Chronic kidney disease, stage 4 (severe): Secondary | ICD-10-CM | POA: Diagnosis not present

## 2020-10-09 DIAGNOSIS — I129 Hypertensive chronic kidney disease with stage 1 through stage 4 chronic kidney disease, or unspecified chronic kidney disease: Secondary | ICD-10-CM | POA: Diagnosis not present

## 2020-10-09 DIAGNOSIS — I2510 Atherosclerotic heart disease of native coronary artery without angina pectoris: Secondary | ICD-10-CM | POA: Diagnosis not present

## 2020-10-09 DIAGNOSIS — E1165 Type 2 diabetes mellitus with hyperglycemia: Secondary | ICD-10-CM | POA: Diagnosis not present

## 2020-10-09 DIAGNOSIS — J449 Chronic obstructive pulmonary disease, unspecified: Secondary | ICD-10-CM | POA: Diagnosis not present

## 2020-10-09 DIAGNOSIS — E1122 Type 2 diabetes mellitus with diabetic chronic kidney disease: Secondary | ICD-10-CM | POA: Diagnosis not present

## 2020-10-09 DIAGNOSIS — D631 Anemia in chronic kidney disease: Secondary | ICD-10-CM | POA: Diagnosis not present

## 2020-10-09 DIAGNOSIS — M810 Age-related osteoporosis without current pathological fracture: Secondary | ICD-10-CM | POA: Diagnosis not present

## 2020-10-09 DIAGNOSIS — M8580 Other specified disorders of bone density and structure, unspecified site: Secondary | ICD-10-CM | POA: Diagnosis not present

## 2020-10-09 MED ORDER — SIMVASTATIN 20 MG PO TABS: 20.0000 mg | ORAL_TABLET | Freq: Every day | ORAL | 0 refills | Status: DC

## 2020-10-09 MED ORDER — FENOFIBRATE 145 MG PO TABS
145.0000 mg | ORAL_TABLET | Freq: Every day | ORAL | 0 refills | Status: DC
Start: 2020-10-09 — End: 2020-10-23

## 2020-10-09 MED ORDER — CARVEDILOL 6.25 MG PO TABS: 6.2500 mg | ORAL_TABLET | Freq: Two times a day (BID) | ORAL | 1 refills | Status: DC

## 2020-10-09 NOTE — Telephone Encounter (Signed)
Pt. Daughter called stating that her mom was going to need all of her medications sent in to [facility] I sent in some of her regular medicines, she also need her citalopram sent in there as well.

## 2020-10-09 NOTE — Telephone Encounter (Signed)
The occupational therapist with [facility] called and said she noticed a change in pts blood pressure. When she was sitting it was 120/60 and when she stood up it dropped to 96/50.

## 2020-10-09 NOTE — Telephone Encounter (Signed)
Spoke with pt and pts daughter [name]. [name] stated that pts kidney dr told them that it would drop when she stands and that's ok. They wanted it to be checked when she stands. [name] will call the kidney Dr first to let them know the readings and go from there.

## 2020-10-10 ENCOUNTER — Telehealth: Payer: Self-pay

## 2020-10-10 NOTE — Telephone Encounter (Signed)
Left message for pt, Courtney Goodman received records from [facility] and would like pt to schedule appt

## 2020-10-16 ENCOUNTER — Inpatient Hospital Stay: Payer: Medicare HMO

## 2020-10-16 DIAGNOSIS — I129 Hypertensive chronic kidney disease with stage 1 through stage 4 chronic kidney disease, or unspecified chronic kidney disease: Secondary | ICD-10-CM | POA: Diagnosis not present

## 2020-10-16 DIAGNOSIS — I2510 Atherosclerotic heart disease of native coronary artery without angina pectoris: Secondary | ICD-10-CM | POA: Diagnosis not present

## 2020-10-16 DIAGNOSIS — E1122 Type 2 diabetes mellitus with diabetic chronic kidney disease: Secondary | ICD-10-CM | POA: Diagnosis not present

## 2020-10-16 DIAGNOSIS — M8580 Other specified disorders of bone density and structure, unspecified site: Secondary | ICD-10-CM | POA: Diagnosis not present

## 2020-10-16 DIAGNOSIS — M810 Age-related osteoporosis without current pathological fracture: Secondary | ICD-10-CM | POA: Diagnosis not present

## 2020-10-16 DIAGNOSIS — N184 Chronic kidney disease, stage 4 (severe): Secondary | ICD-10-CM | POA: Diagnosis not present

## 2020-10-16 DIAGNOSIS — D631 Anemia in chronic kidney disease: Secondary | ICD-10-CM | POA: Diagnosis not present

## 2020-10-16 DIAGNOSIS — J449 Chronic obstructive pulmonary disease, unspecified: Secondary | ICD-10-CM | POA: Diagnosis not present

## 2020-10-16 DIAGNOSIS — E1165 Type 2 diabetes mellitus with hyperglycemia: Secondary | ICD-10-CM | POA: Diagnosis not present

## 2020-10-17 ENCOUNTER — Inpatient Hospital Stay: Payer: Medicare HMO

## 2020-10-17 ENCOUNTER — Ambulatory Visit: Payer: Medicare HMO

## 2020-10-17 DIAGNOSIS — M810 Age-related osteoporosis without current pathological fracture: Secondary | ICD-10-CM | POA: Diagnosis not present

## 2020-10-17 DIAGNOSIS — I2510 Atherosclerotic heart disease of native coronary artery without angina pectoris: Secondary | ICD-10-CM | POA: Diagnosis not present

## 2020-10-17 DIAGNOSIS — N184 Chronic kidney disease, stage 4 (severe): Secondary | ICD-10-CM | POA: Diagnosis not present

## 2020-10-17 DIAGNOSIS — J449 Chronic obstructive pulmonary disease, unspecified: Secondary | ICD-10-CM | POA: Diagnosis not present

## 2020-10-17 DIAGNOSIS — D631 Anemia in chronic kidney disease: Secondary | ICD-10-CM | POA: Diagnosis not present

## 2020-10-17 DIAGNOSIS — M8580 Other specified disorders of bone density and structure, unspecified site: Secondary | ICD-10-CM | POA: Diagnosis not present

## 2020-10-17 DIAGNOSIS — I129 Hypertensive chronic kidney disease with stage 1 through stage 4 chronic kidney disease, or unspecified chronic kidney disease: Secondary | ICD-10-CM | POA: Diagnosis not present

## 2020-10-17 DIAGNOSIS — E1122 Type 2 diabetes mellitus with diabetic chronic kidney disease: Secondary | ICD-10-CM | POA: Diagnosis not present

## 2020-10-17 DIAGNOSIS — E1165 Type 2 diabetes mellitus with hyperglycemia: Secondary | ICD-10-CM | POA: Diagnosis not present

## 2020-10-18 DIAGNOSIS — N184 Chronic kidney disease, stage 4 (severe): Secondary | ICD-10-CM | POA: Diagnosis not present

## 2020-10-18 DIAGNOSIS — D631 Anemia in chronic kidney disease: Secondary | ICD-10-CM | POA: Diagnosis not present

## 2020-10-18 DIAGNOSIS — J449 Chronic obstructive pulmonary disease, unspecified: Secondary | ICD-10-CM | POA: Diagnosis not present

## 2020-10-18 DIAGNOSIS — I2510 Atherosclerotic heart disease of native coronary artery without angina pectoris: Secondary | ICD-10-CM | POA: Diagnosis not present

## 2020-10-18 DIAGNOSIS — M8580 Other specified disorders of bone density and structure, unspecified site: Secondary | ICD-10-CM | POA: Diagnosis not present

## 2020-10-18 DIAGNOSIS — M810 Age-related osteoporosis without current pathological fracture: Secondary | ICD-10-CM | POA: Diagnosis not present

## 2020-10-18 DIAGNOSIS — E1122 Type 2 diabetes mellitus with diabetic chronic kidney disease: Secondary | ICD-10-CM | POA: Diagnosis not present

## 2020-10-18 DIAGNOSIS — I129 Hypertensive chronic kidney disease with stage 1 through stage 4 chronic kidney disease, or unspecified chronic kidney disease: Secondary | ICD-10-CM | POA: Diagnosis not present

## 2020-10-18 DIAGNOSIS — E1165 Type 2 diabetes mellitus with hyperglycemia: Secondary | ICD-10-CM | POA: Diagnosis not present

## 2020-10-22 ENCOUNTER — Other Ambulatory Visit: Payer: Self-pay | Admitting: Family Medicine

## 2020-10-23 DIAGNOSIS — N184 Chronic kidney disease, stage 4 (severe): Secondary | ICD-10-CM | POA: Diagnosis not present

## 2020-10-23 DIAGNOSIS — I129 Hypertensive chronic kidney disease with stage 1 through stage 4 chronic kidney disease, or unspecified chronic kidney disease: Secondary | ICD-10-CM | POA: Diagnosis not present

## 2020-10-23 DIAGNOSIS — E1165 Type 2 diabetes mellitus with hyperglycemia: Secondary | ICD-10-CM | POA: Diagnosis not present

## 2020-10-23 DIAGNOSIS — E1122 Type 2 diabetes mellitus with diabetic chronic kidney disease: Secondary | ICD-10-CM | POA: Diagnosis not present

## 2020-10-23 DIAGNOSIS — M810 Age-related osteoporosis without current pathological fracture: Secondary | ICD-10-CM | POA: Diagnosis not present

## 2020-10-23 DIAGNOSIS — I2510 Atherosclerotic heart disease of native coronary artery without angina pectoris: Secondary | ICD-10-CM | POA: Diagnosis not present

## 2020-10-23 DIAGNOSIS — D631 Anemia in chronic kidney disease: Secondary | ICD-10-CM | POA: Diagnosis not present

## 2020-10-23 DIAGNOSIS — J449 Chronic obstructive pulmonary disease, unspecified: Secondary | ICD-10-CM | POA: Diagnosis not present

## 2020-10-23 DIAGNOSIS — M8580 Other specified disorders of bone density and structure, unspecified site: Secondary | ICD-10-CM | POA: Diagnosis not present

## 2020-10-26 ENCOUNTER — Other Ambulatory Visit: Payer: Self-pay | Admitting: Family Medicine

## 2020-10-26 ENCOUNTER — Telehealth: Payer: Self-pay | Admitting: Endocrinology

## 2020-10-26 NOTE — Telephone Encounter (Signed)
Patients daughter called to request that the new dosage of Novolin as discussed at last visit be sent to [facility], also requesting the new meter kit and supplies to be sent to same, and that Victoza be switch to same at next renewal.  Advises that pharmacy of record should reflect [facility]

## 2020-10-27 ENCOUNTER — Other Ambulatory Visit: Payer: Self-pay | Admitting: Endocrinology

## 2020-10-27 ENCOUNTER — Telehealth: Payer: Self-pay

## 2020-10-27 ENCOUNTER — Other Ambulatory Visit: Payer: Self-pay

## 2020-10-27 DIAGNOSIS — N183 Chronic kidney disease, stage 3 unspecified: Secondary | ICD-10-CM

## 2020-10-27 DIAGNOSIS — Z794 Long term (current) use of insulin: Secondary | ICD-10-CM

## 2020-10-27 DIAGNOSIS — E1122 Type 2 diabetes mellitus with diabetic chronic kidney disease: Secondary | ICD-10-CM

## 2020-10-27 MED ORDER — PEN NEEDLES 32G X 5 MM MISC
1.0000 | Freq: Every day | 3 refills | Status: DC
Start: 2020-10-27 — End: 2021-06-07

## 2020-10-27 MED ORDER — VICTOZA 18 MG/3ML ~~LOC~~ SOPN: 0.6000 mg | PEN_INJECTOR | SUBCUTANEOUS | 0 refills | Status: DC

## 2020-10-27 MED ORDER — ONETOUCH VERIO VI STRP: 1.0000 | ORAL_STRIP | Freq: Two times a day (BID) | 12 refills | Status: AC

## 2020-10-27 MED ORDER — ONETOUCH DELICA PLUS LANCET33G MISC: 0 refills | Status: DC

## 2020-10-27 MED ORDER — NOVOLIN N FLEXPEN 100 UNIT/ML ~~LOC~~ SUPN: 28.0000 [IU] | PEN_INJECTOR | SUBCUTANEOUS | 11 refills | Status: DC

## 2020-10-27 NOTE — Telephone Encounter (Signed)
Pt. Daughter called stating that she needs refills on her Citalopram, Mirtazapine, and trazodone to [facility] pt. Last apt was 09/19/20.

## 2020-10-27 NOTE — Telephone Encounter (Signed)
Patient notified she states she knows this and is going to relay information to daughter

## 2020-10-28 DIAGNOSIS — N184 Chronic kidney disease, stage 4 (severe): Secondary | ICD-10-CM | POA: Diagnosis not present

## 2020-10-28 DIAGNOSIS — R634 Abnormal weight loss: Secondary | ICD-10-CM | POA: Diagnosis not present

## 2020-10-28 DIAGNOSIS — R55 Syncope and collapse: Secondary | ICD-10-CM | POA: Diagnosis not present

## 2020-10-30 ENCOUNTER — Inpatient Hospital Stay: Payer: Medicare HMO | Attending: Oncology

## 2020-11-01 DIAGNOSIS — I2510 Atherosclerotic heart disease of native coronary artery without angina pectoris: Secondary | ICD-10-CM | POA: Diagnosis not present

## 2020-11-01 DIAGNOSIS — E1165 Type 2 diabetes mellitus with hyperglycemia: Secondary | ICD-10-CM | POA: Diagnosis not present

## 2020-11-01 DIAGNOSIS — M810 Age-related osteoporosis without current pathological fracture: Secondary | ICD-10-CM | POA: Diagnosis not present

## 2020-11-01 DIAGNOSIS — N184 Chronic kidney disease, stage 4 (severe): Secondary | ICD-10-CM | POA: Diagnosis not present

## 2020-11-01 DIAGNOSIS — I129 Hypertensive chronic kidney disease with stage 1 through stage 4 chronic kidney disease, or unspecified chronic kidney disease: Secondary | ICD-10-CM | POA: Diagnosis not present

## 2020-11-01 DIAGNOSIS — D631 Anemia in chronic kidney disease: Secondary | ICD-10-CM | POA: Diagnosis not present

## 2020-11-01 DIAGNOSIS — E1122 Type 2 diabetes mellitus with diabetic chronic kidney disease: Secondary | ICD-10-CM | POA: Diagnosis not present

## 2020-11-01 DIAGNOSIS — M8580 Other specified disorders of bone density and structure, unspecified site: Secondary | ICD-10-CM | POA: Diagnosis not present

## 2020-11-01 DIAGNOSIS — J449 Chronic obstructive pulmonary disease, unspecified: Secondary | ICD-10-CM | POA: Diagnosis not present

## 2020-11-07 ENCOUNTER — Telehealth: Payer: Self-pay | Admitting: Internal Medicine

## 2020-11-07 NOTE — Telephone Encounter (Signed)
Per 10/18 reschedule, pt has been called and confirmed appt

## 2020-11-10 DIAGNOSIS — D631 Anemia in chronic kidney disease: Secondary | ICD-10-CM | POA: Diagnosis not present

## 2020-11-10 DIAGNOSIS — N184 Chronic kidney disease, stage 4 (severe): Secondary | ICD-10-CM | POA: Diagnosis not present

## 2020-11-10 DIAGNOSIS — M8580 Other specified disorders of bone density and structure, unspecified site: Secondary | ICD-10-CM | POA: Diagnosis not present

## 2020-11-10 DIAGNOSIS — M810 Age-related osteoporosis without current pathological fracture: Secondary | ICD-10-CM | POA: Diagnosis not present

## 2020-11-10 DIAGNOSIS — I2510 Atherosclerotic heart disease of native coronary artery without angina pectoris: Secondary | ICD-10-CM | POA: Diagnosis not present

## 2020-11-10 DIAGNOSIS — E1122 Type 2 diabetes mellitus with diabetic chronic kidney disease: Secondary | ICD-10-CM | POA: Diagnosis not present

## 2020-11-10 DIAGNOSIS — I129 Hypertensive chronic kidney disease with stage 1 through stage 4 chronic kidney disease, or unspecified chronic kidney disease: Secondary | ICD-10-CM | POA: Diagnosis not present

## 2020-11-10 DIAGNOSIS — J449 Chronic obstructive pulmonary disease, unspecified: Secondary | ICD-10-CM | POA: Diagnosis not present

## 2020-11-10 DIAGNOSIS — E1165 Type 2 diabetes mellitus with hyperglycemia: Secondary | ICD-10-CM | POA: Diagnosis not present

## 2020-11-13 ENCOUNTER — Ambulatory Visit (INDEPENDENT_AMBULATORY_CARE_PROVIDER_SITE_OTHER): Payer: Medicare HMO | Admitting: Neurology

## 2020-11-13 ENCOUNTER — Encounter: Payer: Self-pay | Admitting: Neurology

## 2020-11-13 ENCOUNTER — Other Ambulatory Visit: Payer: Self-pay

## 2020-11-13 VITALS — BP 185/78 | HR 59 | Resp 18 | Ht 64.0 in | Wt 143.0 lb

## 2020-11-13 DIAGNOSIS — F0390 Unspecified dementia without behavioral disturbance: Secondary | ICD-10-CM | POA: Diagnosis not present

## 2020-11-13 MED ORDER — RIVASTIGMINE 4.6 MG/24HR TD PT24: 4.6000 mg | MEDICATED_PATCH | Freq: Every day | TRANSDERMAL | 3 refills | Status: DC

## 2020-11-13 NOTE — Progress Notes (Signed)
NEUROLOGY FOLLOW UP OFFICE NOTE  Courtney Goodman [id number] 1945/10/21  HISTORY OF PRESENT ILLNESS: I had the pleasure of seeing Courtney Goodman in follow-up in the neurology clinic on 11/13/2020.  The patient was last seen over a year ago for memory loss. She is accompanied by her daughter Courtney Goodman who helps supplement the history today.  Records and images were personally reviewed where available. She presented for Neuropsychological testing in August 2021 however was somewhat slow to respond and comprehensive testing was unable to be done. MMSE was 24/30. She was sent to Dr. Cordelia Goodman office, as both patient and daughter noted that she gets more confused with dysglycemia episodes. They were instructed to call back to reschedule once glucose levels were better controlled, but this has not occurred. She presents today for follow-up.   She comes for an 8:30am appointment today, Courtney Goodman reports that she does not do well with morning appointments. She is again slow to respond to questions. She has not taken her morning medications and has not checked her glucose levels, her glucometer has not been working. She feels her memory is "fair." Courtney Goodman reports worsening memory. She was forgetting to take her medications. She was not missing bill payments but her card would end up in the trash, so Courtney Goodman took over finances 2 months ago. She was in the ER on 09/12/20 for syncope. She had not eaten then woke up on the floor. Glucose level was 449, creatinine 2.45.Head CT no acute changes, there was atrophy and chronic microvascular disease. Since then, family has taken over medication management. With this, glucose levels are improved, between 130-140s. She lives with her grandson. She does not drive. She has an aide coming daily 2.5 hrs a day to help with meals and standby for bathing. She can dress herself. She has PT coming every 2 weeks but she refuses to exercise with the aide, Courtney Goodman has to push her. No paranoia,  hallucinations, wandering behaviors. She does not know if she slept well last night, Courtney Goodman denies any sleep concerns. She feels dizzy sometimes. No headaches, focal numbness/tingling/weakness. She states she cannot see objects in front of her, this is intermittent, she was able to count fingers and later on name "Goodman." She has been on Rivastigmine patch since June, started when she was in a hospital in [place] again for hyperglycemia.     History on Initial Assessment 08/03/2019: This is a 75 year old right-handed woman with a history of hypertension, hyperlipidemia, diabetes, presenting for evaluation of cognitive impairment. She comes to the office this morning stating she woke up very confused, she is wearing mis-matched shoes, wearing a shoe on the right foot and a sandal on the left foot. She states she has not been feeling well this past week, her sugars have been acting up and insulin dose was adjusted a week ago. She was given ginger ale on arrival since she was unsteady, glucose reading after ginger ale was 81, which she states is still low for her. She states she is very confused and cannot remember things when her glucose levels are low. She feels good in the afternoons when her glucose levels are in the 150s. She states she does not eat enough when she goes to sleep at night, so her glucose levels drop and she wakes up hypoglycemic and confused. One time she was very confused, glucose level was in the 30s, she was brought to the ER and level was up by then. She states she has lost  her appetite, she does not forget to eat but feels sick so she does not eat. She does not think she forgets her medications, she denies any missed bill payments. I spoke to her daughter Courtney Goodman on the phone to obtain additional information. Courtney Goodman reports that memory changes only started 6-7 months ago when her insulin dose was changed. Courtney Goodman was not noticing any memory concerns prior to this, she notes when the insulin dose was  increased or decreased, her mother would get discombobulated, some mornings she wakes up not knowing what is going on and her glucose level would be either low or high. She lives alone, Courtney Goodman agrees that she is compliant with her medications and knows how she should take her insulin if her level is low. Her other daughter comes daily to check on her, Courtney Goodman comes once a week and calls daily. Courtney Goodman states she is fine with cooking, when her glucose level is up/down, she may forget and left the stove on one time. She reports going to the grocery and having to ask her daughter for her PIN number because she could not remember. Courtney Goodman states she has not been driving for 1.5 years, Courtney Goodman denies getting lost, however the patient reports that she stopped driving because she would have spells where she ends up somewhere and would not remember her she got there, usually when her glucose levels were off. Courtney Goodman reports bathing has gotten a little sketchy because she if afraid of falling in the shower. I discussed the patients state of dress today (wearing mismatched shoes), Courtney Goodman states this is unusual, she normally dresses fine but has a hard time with early morning appointments because she has not yet eaten so her glucose levels are low.She has a bedside commode and wears adult pads for urinary incontinence. Courtney Goodman feels she needs assistance with showering/dressing, bu the patient has been turning down assistance.   She reports frontal headaches 1-2 times a week, she thinks this is stress-related, she cannot remember things. She denies any diplopia, dysarthria/dysphagia, neck/back pain, focal numbness/tingling, bowel dysfunction, anosmia, or tremors. Sleep is okay with Trazodone. She feels off balance, she has a walker/cane at home but does not use it. She reports falling out of bed this week, she was asleep and turned and fell off, waking up on the floor. Courtney Goodman denies any personality changes, "she is a Retail buyer," but no  hallucinations or paranoia. No family history of dementia, no history of significant head injuries or alcohol use.   Her last brain imaging on [facility] was in 2012, with no acute changes seen. There was note of a small punctate linear shaped area of low-attenuation that projects within the medial of caudate nucleus on the right, a second more vague area projects just lateral to this, likely representing sequela or chronic and subacute areas of lacunar infarct. There was a small punctate area of calcification along the periphery of the left occipital region, likely a small calcified meningioma  Laboratory Data: Lab Results  Component Value Date   TSH 2.90 09/12/2020   Lab Results  Component Value Date   GURKYHCW23 762 (H) 09/13/2020   Lab Results  Component Value Date   HGBA1C 10.6 (A) 09/12/2020     PAST MEDICAL HISTORY: Past Medical History:  Diagnosis Date   Anemia of chronic disease    Asthma    Atherosclerotic heart disease of native coronary artery without angina pectoris    CKD (chronic kidney disease)    COPD (chronic obstructive pulmonary  disease) ([place])    DM (diabetes mellitus), type 2 with renal complications ([place])    Dysrhythmia    Elevated ferritin 01/03/2017   GAD (generalized anxiety disorder)    Hypertension    Mixed hyperlipidemia due to type 2 diabetes mellitus ([facility])    Mixed incontinence    per medical records from [facility]   Osteopenia    Personal history of noncompliance with medical treatment, presenting hazards to health    Shortness of breath dyspnea    Suicidal ideation    TB (tuberculosis), treated    age 40   Vitamin D deficiency     MEDICATIONS: Current Outpatient Medications on File Prior to Visit  Medication Sig Dispense Refill   albuterol (PROVENTIL HFA;VENTOLIN HFA) 108 (90 Base) MCG/ACT inhaler Inhale 2 puffs into the lungs every 6 (six) hours as needed for wheezing or shortness of breath. 1 Inhaler 0   Alcohol Swabs (ALCOHOL PREP) PADS Use for  testing (Patient taking differently: 1 each by Other route 2 (two) times daily. E11.9) 100 each 1   amLODipine-benazepril (LOTREL) 5-20 MG capsule Take 1 capsule by mouth every morning.     aspirin EC 81 MG tablet Take 81 mg by mouth daily.      Biotin 10000 MCG TABS Take 1 tablet by mouth daily.      carvedilol (COREG) 6.25 MG tablet TAKE ONE TABLET BY MOUTH EVERY MORNING and TAKE ONE TABLET BY MOUTH EVERYDAY AT BEDTIME 60 tablet 2   Cholecalciferol (VITAMIN D3) 25 MCG (1000 UT) CAPS Take 1 capsule by mouth daily.     Cholecalciferol 25 MCG (1000 UT) tablet Take 1 tablet by mouth daily. 90 tablet 0   citalopram (CELEXA) 20 MG tablet Take 1 tablet (20 mg total) by mouth daily. 90 tablet 1   fenofibrate (TRICOR) 145 MG tablet TAKE ONE TABLET BY MOUTH ONCE DAILY 30 tablet 2   fluticasone (FLONASE) 50 MCG/ACT nasal spray Place 2 sprays into both nostrils daily. 48 g 0   folic acid (FOLVITE) 1 MG tablet TAKE ONE TABLET BY MOUTH EVERY MORNING 30 tablet 2   glucose blood (ONETOUCH VERIO) test strip 1 each by Other route 2 (two) times daily. And lancets 2/day 100 each 12   Insulin NPH, Human,, Isophane, (NOVOLIN N FLEXPEN) 100 UNIT/ML Kiwkpen Inject 28 Units into the skin every morning. And Goodman needles 1/day 30 mL 11   Insulin Goodman Needle (Goodman NEEDLES) 32G X 5 MM MISC 1 each by Does not apply route daily. 100 each 3   Lancets (ONETOUCH DELICA PLUS [id number]) MISC USE TO test four times A DAY 400 each 0   liraglutide (VICTOZA) 18 MG/3ML SOPN Inject 0.6 mg into the skin every morning. 27 mL 0   mirtazapine (REMERON) 7.5 MG tablet Take 7.5 mg by mouth at bedtime.     omeprazole (PRILOSEC) 40 MG capsule TAKE ONE CAPSULE BY MOUTH EVERY MORNING 30 capsule 2   rivastigmine (EXELON) 4.6 mg/24hr Place 4.6 mg onto the skin daily.     simvastatin (ZOCOR) 20 MG tablet TAKE ONE TABLET BY MOUTH EVERYDAY AT BEDTIME 30 tablet 2   traZODone (DESYREL) 50 MG tablet Take 50 mg by mouth at bedtime.      No current  facility-administered medications on file prior to visit.    ALLERGIES: No Known Allergies  FAMILY HISTORY: Family History  Problem Relation Age of Onset   Diabetes Mother     SOCIAL HISTORY: Social History   Socioeconomic History  Marital status: Divorced    Spouse name: Not on file   Number of children: 2   Years of education: 12   Highest education level: High school graduate  Occupational History   Occupation: Retired  Tobacco Use   Smoking status: Former   Smokeless tobacco: Never  Scientific laboratory technician Use: Never used  Substance and Sexual Activity   Alcohol use: No   Drug use: No   Sexual activity: Never  Other Topics Concern   Not on file  Social History Narrative   Lives alone    Left handed   Social Determinants of Health   Financial Resource Strain: Not on file  Food Insecurity: No Food Insecurity   Worried About Running Out of Food in the Last Year: Never true   [place] in the Last Year: Never true  Transportation Needs: No Transportation Needs   Lack of Transportation (Medical): No   Lack of Transportation (Non-Medical): No  Physical Activity: Not on file  Stress: Not on file  Social Connections: Not on file  Intimate Partner Violence: Not on file     PHYSICAL EXAM: Vitals:   11/13/20 0834  BP: (!) 185/78  Pulse: (!) 59  Resp: 18  SpO2: 100%   General: No acute distress Head:  Normocephalic/atraumatic Skin/Extremities: No rash, no edema Neurological Exam: alert and oriented to person, place, month/season. She is again slow to respond but answers questions appropriately when she does. No aphasia or dysarthria. Fund of knowledge is reduced.Recent and remote memory are impaired.  Attention and concentration are reduced. MMSE 15/30. MMSE - Mini Mental State Exam 11/13/2020 09/15/2019  Orientation to time 2 5  Orientation to Place 5 4  Registration 3 3  Attention/ Calculation 0 4  Recall 0 1  Language- name 2 objects 1 2   Language- repeat 1 1  Language- follow 3 step command 2 2  Language- read & follow direction 1 1  Write a sentence 0 1  Copy design 0 0  Total score 15 24   Cranial nerves: Pupils equal, round. Extraocular movements intact with no nystagmus. Visual fields full.  No facial asymmetry.  Motor: Bulk and tone normal, muscle strength 5/5 throughout with no pronator drift.   Finger to nose testing intact.  Gait slow and cautious, no ataxia   IMPRESSION: This is a pleasant 75 yo RH woman with a history of hypertension, hyperlipidemia, diabetes, with dementia, likely vascular. Her brain MRI in 2021 showed mild chronic microvascular disease, burden did not seem significant enough however she has a prolonged history of uncontrolled diabetes with episodes of worsening confusion with episodes of hyper/hypoglycemia. Family is now managing her medications with improved glucose control. Continue Rivastigmine 4.6mg  daily. We discussed the importance of control of vascular risk factors, physical exercise, brain stimulation exercises, and MIND diet for overall brain health. Continue close supervision. She does not drive. She is slow to respond, MMSE 15/30. Follow-up with Memory Disorders PA Sharene Butters in 6 months, they are requesting an afternoon appointment reporting she does not do well with morning appointments such as today.   Thank you for allowing me to participate in her care.  Please do not hesitate to call for any questions or concerns.    Ellouise Newer, M.D.   CC: [name], PA-C

## 2020-11-13 NOTE — Patient Instructions (Signed)
Continue Rivastigmine 4.6mg  every 24 hours  2. It is very important to control your sugar levels, blood pressure, and cholesterol  3. It is very important to exercise daily and working on the physical therapy exercises  4. Follow-up in 6 months, call for any changes   FALL PRECAUTIONS: Be cautious when walking. Scan the area for obstacles that may increase the risk of trips and falls. When getting up in the mornings, sit up at the edge of the bed for a few minutes before getting out of bed. Consider elevating the bed at the head end to avoid drop of blood pressure when getting up. Walk always in a well-lit room (use night lights in the walls). Avoid area rugs or power cords from appliances in the middle of the walkways. Use a walker or a cane if necessary and consider physical therapy for balance exercise. Get your eyesight checked regularly.  HOME SAFETY: Consider the safety of the kitchen when operating appliances like stoves, microwave oven, and blender. Consider having supervision and share cooking responsibilities until no longer able to participate in those. Accidents with firearms and other hazards in the house should be identified and addressed as well.  ABILITY TO BE LEFT ALONE: If patient is unable to contact 911 operator, consider using LifeLine, or when the need is there, arrange for someone to stay with patients. Smoking is a fire hazard, consider supervision or cessation. Risk of wandering should be assessed by caregiver and if detected at any point, supervision and safe proof recommendations should be instituted.   RECOMMENDATIONS FOR ALL PATIENTS WITH MEMORY PROBLEMS: 1. Continue to exercise (Recommend 30 minutes of walking everyday, or 3 hours every week) 2. Increase social interactions - continue going to [place] and enjoy social gatherings with friends and family 3. Eat healthy, avoid fried foods and eat more fruits and vegetables 4. Maintain adequate blood pressure, blood  sugar, and blood cholesterol level. Reducing the risk of stroke and cardiovascular disease also helps promoting better memory. 5. Avoid stressful situations. Live a simple life and avoid aggravations. Organize your time and prepare for the next day in anticipation. 6. Sleep well, avoid any interruptions of sleep and avoid any distractions in the bedroom that may interfere with adequate sleep quality 7. Avoid sugar, avoid sweets as there is a strong link between excessive sugar intake, diabetes, and cognitive impairment We discussed the Mediterranean diet, which has been shown to help patients reduce the risk of progressive memory disorders and reduces cardiovascular risk. This includes eating fish, eat fruits and green leafy vegetables, nuts like almonds and hazelnuts, walnuts, and also use olive oil. Avoid fast foods and fried foods as much as possible. Avoid sweets and sugar as sugar use has been linked to worsening of memory function.  There is always a concern of gradual progression of memory problems. If this is the case, then we may need to adjust level of care according to patient needs. Support, both to the patient and caregiver, should then be put into place.      Mediterranean Diet  Why follow it? Research shows. Those who follow the Mediterranean diet have a reduced risk of heart disease  The diet is associated with a reduced incidence of Parkinson's and Alzheimer's diseases People following the diet may have longer life expectancies and lower rates of chronic diseases  The Dietary Guidelines for Americans recommends the Mediterranean diet as an eating plan to promote health and prevent disease  What Is the Mediterranean Diet?  Healthy eating plan based on typical foods and recipes of Mediterranean-style cooking The diet is primarily a plant based diet; these foods should make up a majority of meals   Starches - Plant based foods should make up a majority of meals - They are an  important sources of vitamins, minerals, energy, antioxidants, and fiber - Choose whole grains, foods high in fiber and minimally processed items  - Typical grain sources include wheat, oats, barley, corn, brown rice, bulgar, farro, millet, polenta, couscous  - Various types of beans include chickpeas, lentils, fava beans, black beans, white beans   Fruits  Veggies - Large quantities of antioxidant rich fruits & veggies; 6 or more servings  - Vegetables can be eaten raw or lightly drizzled with oil and cooked  - Vegetables common to the traditional Mediterranean Diet include: artichokes, arugula, beets, broccoli, brussel sprouts, cabbage, carrots, celery, collard greens, cucumbers, eggplant, kale, leeks, lemons, lettuce, mushrooms, okra, onions, peas, peppers, potatoes, pumpkin, radishes, rutabaga, shallots, spinach, sweet potatoes, turnips, zucchini - Fruits common to the Mediterranean Diet include: apples, apricots, avocados, cherries, clementines, dates, figs, grapefruits, grapes, melons, nectarines, oranges, peaches, pears, pomegranates, strawberries, tangerines  Fats - Replace butter and margarine with healthy oils, such as olive oil, canola oil, and tahini  - Limit nuts to no more than a handful a day  - Nuts include walnuts, almonds, pecans, pistachios, pine nuts  - Limit or avoid candied, honey roasted or heavily salted nuts - Olives are central to the [employer] - can be eaten whole or used in a variety of dishes   Meats Protein - Limiting red meat: no more than a few times a month - When eating red meat: choose lean cuts and keep the portion to the size of deck of cards - Eggs: approx. 0 to 4 times a week  - Fish and lean poultry: at least 2 a week  - Healthy protein sources include, chicken, [place], lean beef, lamb - Increase intake of seafood such as tuna, salmon, trout, mackerel, shrimp, scallops - Avoid or limit high fat processed meats such as sausage and bacon  Dairy -  Include moderate amounts of low fat dairy products  - Focus on healthy dairy such as fat free yogurt, skim milk, low or reduced fat cheese - Limit dairy products higher in fat such as whole or 2% milk, cheese, ice cream  Alcohol - Moderate amounts of red wine is ok  - No more than 5 oz daily for women (all ages) and men older than age 17  - No more than 10 oz of wine daily for men younger than 70  Other - Limit sweets and other desserts  - Use herbs and spices instead of salt to flavor foods  - Herbs and spices common to the traditional Mediterranean Diet include: basil, bay leaves, chives, cloves, cumin, fennel, garlic, lavender, marjoram, mint, oregano, parsley, pepper, rosemary, sage, savory, sumac, tarragon, thyme   It's not just a diet, it's a lifestyle:  The Mediterranean diet includes lifestyle factors typical of those in the region  Foods, drinks and meals are best eaten with others and savored Daily physical activity is important for overall good health This could be strenuous exercise like running and aerobics This could also be more leisurely activities such as walking, housework, yard-work, or taking the stairs Moderation is the key; a balanced and healthy diet accommodates most foods and drinks Consider portion sizes and frequency of consumption of certain foods  Meal Ideas & Options:  Breakfast:  Whole wheat toast or whole wheat English muffins with peanut butter & hard boiled egg Steel cut oats topped with apples & cinnamon and skim milk  Fresh fruit: banana, strawberries, melon, berries, peaches  Smoothies: strawberries, bananas, greek yogurt, peanut butter Low fat greek yogurt with blueberries and granola  Egg white omelet with spinach and mushrooms Breakfast couscous: whole wheat couscous, apricots, skim milk, cranberries  Sandwiches:  Hummus and grilled vegetables (peppers, zucchini, squash) on whole wheat bread   Grilled chicken on whole wheat pita with lettuce,  tomatoes, cucumbers or tzatziki  [place] salad on whole wheat bread: tuna salad made with greek yogurt, olives, red peppers, capers, green onions Garlic rosemary lamb pita: lamb sauted with garlic, rosemary, salt & pepper; add lettuce, cucumber, greek yogurt to pita - flavor with lemon juice and black pepper  Seafood:  Mediterranean grilled salmon, seasoned with garlic, basil, parsley, lemon juice and black pepper Shrimp, lemon, and spinach whole-grain pasta salad made with low fat greek yogurt  Seared scallops with lemon orzo  Seared tuna steaks seasoned salt, pepper, coriander topped with tomato mixture of olives, tomatoes, olive oil, minced garlic, parsley, green onions and cappers  Meats:  Herbed greek chicken salad with kalamata olives, cucumber, feta  Red bell peppers stuffed with spinach, bulgur, lean ground beef (or lentils) & topped with feta   Kebabs: skewers of chicken, tomatoes, onions, zucchini, squash  [place] burgers: made with red onions, mint, dill, lemon juice, feta cheese topped with roasted red peppers Vegetarian Cucumber salad: cucumbers, artichoke hearts, celery, red onion, feta cheese, tossed in olive oil & lemon juice  Hummus and whole grain pita points with a greek salad (lettuce, tomato, feta, olives, cucumbers, red onion) Lentil soup with celery, carrots made with vegetable broth, garlic, salt and pepper  Tabouli salad: parsley, bulgur, mint, scallions, cucumbers, tomato, radishes, lemon juice, olive oil, salt and pepper.

## 2020-11-14 ENCOUNTER — Ambulatory Visit: Payer: Medicare HMO | Admitting: Endocrinology

## 2020-11-14 ENCOUNTER — Inpatient Hospital Stay: Payer: Medicare HMO | Admitting: Internal Medicine

## 2020-11-14 ENCOUNTER — Inpatient Hospital Stay: Payer: Medicare HMO

## 2020-11-14 DIAGNOSIS — M8580 Other specified disorders of bone density and structure, unspecified site: Secondary | ICD-10-CM | POA: Diagnosis not present

## 2020-11-14 DIAGNOSIS — J449 Chronic obstructive pulmonary disease, unspecified: Secondary | ICD-10-CM | POA: Diagnosis not present

## 2020-11-14 DIAGNOSIS — D631 Anemia in chronic kidney disease: Secondary | ICD-10-CM | POA: Diagnosis not present

## 2020-11-14 DIAGNOSIS — I2510 Atherosclerotic heart disease of native coronary artery without angina pectoris: Secondary | ICD-10-CM | POA: Diagnosis not present

## 2020-11-14 DIAGNOSIS — M810 Age-related osteoporosis without current pathological fracture: Secondary | ICD-10-CM | POA: Diagnosis not present

## 2020-11-14 DIAGNOSIS — I129 Hypertensive chronic kidney disease with stage 1 through stage 4 chronic kidney disease, or unspecified chronic kidney disease: Secondary | ICD-10-CM | POA: Diagnosis not present

## 2020-11-14 DIAGNOSIS — E1122 Type 2 diabetes mellitus with diabetic chronic kidney disease: Secondary | ICD-10-CM | POA: Diagnosis not present

## 2020-11-14 DIAGNOSIS — E1165 Type 2 diabetes mellitus with hyperglycemia: Secondary | ICD-10-CM | POA: Diagnosis not present

## 2020-11-14 DIAGNOSIS — N184 Chronic kidney disease, stage 4 (severe): Secondary | ICD-10-CM | POA: Diagnosis not present

## 2020-11-20 ENCOUNTER — Inpatient Hospital Stay: Payer: Medicare HMO | Admitting: Internal Medicine

## 2020-11-20 ENCOUNTER — Inpatient Hospital Stay: Payer: Medicare HMO

## 2020-11-28 ENCOUNTER — Inpatient Hospital Stay (HOSPITAL_BASED_OUTPATIENT_CLINIC_OR_DEPARTMENT_OTHER): Payer: Medicare HMO | Admitting: Internal Medicine

## 2020-11-28 ENCOUNTER — Inpatient Hospital Stay: Payer: Medicare HMO | Attending: Oncology

## 2020-11-28 ENCOUNTER — Other Ambulatory Visit: Payer: Self-pay

## 2020-11-28 VITALS — BP 137/66 | HR 59 | Temp 97.5°F | Resp 18 | Wt 144.1 lb

## 2020-11-28 DIAGNOSIS — D631 Anemia in chronic kidney disease: Secondary | ICD-10-CM | POA: Insufficient documentation

## 2020-11-28 DIAGNOSIS — N189 Chronic kidney disease, unspecified: Secondary | ICD-10-CM

## 2020-11-28 DIAGNOSIS — E560 Deficiency of vitamin E: Secondary | ICD-10-CM | POA: Insufficient documentation

## 2020-11-28 DIAGNOSIS — N183 Chronic kidney disease, stage 3 unspecified: Secondary | ICD-10-CM | POA: Insufficient documentation

## 2020-11-28 DIAGNOSIS — J449 Chronic obstructive pulmonary disease, unspecified: Secondary | ICD-10-CM | POA: Insufficient documentation

## 2020-11-28 DIAGNOSIS — E785 Hyperlipidemia, unspecified: Secondary | ICD-10-CM | POA: Insufficient documentation

## 2020-11-28 DIAGNOSIS — Z862 Personal history of diseases of the blood and blood-forming organs and certain disorders involving the immune mechanism: Secondary | ICD-10-CM

## 2020-11-28 DIAGNOSIS — E119 Type 2 diabetes mellitus without complications: Secondary | ICD-10-CM | POA: Diagnosis not present

## 2020-11-28 DIAGNOSIS — Z79899 Other long term (current) drug therapy: Secondary | ICD-10-CM | POA: Diagnosis not present

## 2020-11-28 DIAGNOSIS — I2510 Atherosclerotic heart disease of native coronary artery without angina pectoris: Secondary | ICD-10-CM | POA: Diagnosis not present

## 2020-11-28 DIAGNOSIS — I129 Hypertensive chronic kidney disease with stage 1 through stage 4 chronic kidney disease, or unspecified chronic kidney disease: Secondary | ICD-10-CM | POA: Insufficient documentation

## 2020-11-28 DIAGNOSIS — D638 Anemia in other chronic diseases classified elsewhere: Secondary | ICD-10-CM

## 2020-11-28 DIAGNOSIS — M8580 Other specified disorders of bone density and structure, unspecified site: Secondary | ICD-10-CM | POA: Diagnosis not present

## 2020-11-28 LAB — CBC WITH DIFFERENTIAL (CANCER CENTER ONLY)
Abs Immature Granulocytes: 0.03 10*3/uL (ref 0.00–0.07)
Basophils Absolute: 0 10*3/uL (ref 0.0–0.1)
Basophils Relative: 0 %
Eosinophils Absolute: 0.1 10*3/uL (ref 0.0–0.5)
Eosinophils Relative: 2 %
HCT: 28 % — ABNORMAL LOW (ref 36.0–46.0)
Hemoglobin: 9.5 g/dL — ABNORMAL LOW (ref 12.0–15.0)
Immature Granulocytes: 0 %
Lymphocytes Relative: 18 %
Lymphs Abs: 1.3 10*3/uL (ref 0.7–4.0)
MCH: 30.8 pg (ref 26.0–34.0)
MCHC: 33.9 g/dL (ref 30.0–36.0)
MCV: 90.9 fL (ref 80.0–100.0)
Monocytes Absolute: 0.5 10*3/uL (ref 0.1–1.0)
Monocytes Relative: 7 %
Neutro Abs: 5.1 10*3/uL (ref 1.7–7.7)
Neutrophils Relative %: 73 %
Platelet Count: 282 10*3/uL (ref 150–400)
RBC: 3.08 MIL/uL — ABNORMAL LOW (ref 3.87–5.11)
RDW: 15.9 % — ABNORMAL HIGH (ref 11.5–15.5)
WBC Count: 7.2 10*3/uL (ref 4.0–10.5)
nRBC: 0 % (ref 0.0–0.2)

## 2020-11-28 LAB — IRON AND TIBC
Iron: 66 ug/dL (ref 41–142)
Saturation Ratios: 23 % (ref 21–57)
TIBC: 289 ug/dL (ref 236–444)
UIBC: 222 ug/dL (ref 120–384)

## 2020-11-28 LAB — FERRITIN: Ferritin: 510 ng/mL — ABNORMAL HIGH (ref 11–307)

## 2020-11-28 NOTE — Progress Notes (Signed)
[place] [phone]   [phone]  OFFICE PROGRESS NOTE  Raenette Rover, Vickie L, PA-C No address on file  DIAGNOSIS: Anemia of chronic disease secondary to chronic renal insufficiency  PRIOR THERAPY: None  CURRENT THERAPY: Retacrit 40,000 units subcutaneously every 3 weeks in addition to over-the-counter oral iron tablet 1 tablet every day.  INTERVAL HISTORY: Courtney Goodman 75 y.o. female returns to the clinic today for follow-up visit accompanied by her daughter.  The patient is feeling fine today with no concerning complaints except for the persistent fatigue.  She has been taking her oral iron tablet few times a week but not at regular basis.  She denied having any current chest pain but has shortness of breath with exertion with no cough or hemoptysis.  She has no headache or visual changes.  She denied having any recent weight loss or night sweats.  She has no nausea, vomiting, diarrhea or constipation.  She is here today for evaluation and repeat CBC, iron study and ferritin.  MEDICAL HISTORY: Past Medical History:  Diagnosis Date   Anemia of chronic disease    Asthma    Atherosclerotic heart disease of native coronary artery without angina pectoris    CKD (chronic kidney disease)    COPD (chronic obstructive pulmonary disease) (HCC)    DM (diabetes mellitus), type 2 with renal complications (HCC)    Dysrhythmia    Elevated ferritin 01/03/2017   GAD (generalized anxiety disorder)    Hypertension    Mixed hyperlipidemia due to type 2 diabetes mellitus (HCC)    Mixed incontinence    per medical records from [facility]   Osteopenia    Personal history of noncompliance with medical treatment, presenting hazards to health    Shortness of breath dyspnea    Suicidal ideation    TB (tuberculosis), treated    age 44   Vitamin D deficiency     ALLERGIES:  has No Known Allergies.  MEDICATIONS:  Current Outpatient Medications  Medication Sig  Dispense Refill   albuterol (PROVENTIL HFA;VENTOLIN HFA) 108 (90 Base) MCG/ACT inhaler Inhale 2 puffs into the lungs every 6 (six) hours as needed for wheezing or shortness of breath. 1 Inhaler 0   Alcohol Swabs (ALCOHOL PREP) PADS Use for testing (Patient taking differently: 1 each by Other route 2 (two) times daily. E11.9) 100 each 1   amLODipine-benazepril (LOTREL) 5-20 MG capsule Take 1 capsule by mouth every morning.     aspirin EC 81 MG tablet Take 81 mg by mouth daily.      Biotin 10000 MCG TABS Take 1 tablet by mouth daily.      carvedilol (COREG) 6.25 MG tablet TAKE ONE TABLET BY MOUTH EVERY MORNING and TAKE ONE TABLET BY MOUTH EVERYDAY AT BEDTIME 60 tablet 2   Cholecalciferol (VITAMIN D3) 25 MCG (1000 UT) CAPS Take 1 capsule by mouth daily.     Cholecalciferol 25 MCG (1000 UT) tablet Take 1 tablet by mouth daily. 90 tablet 0   citalopram (CELEXA) 20 MG tablet Take 1 tablet (20 mg total) by mouth daily. 90 tablet 1   fenofibrate (TRICOR) 145 MG tablet TAKE ONE TABLET BY MOUTH ONCE DAILY 30 tablet 2   fluticasone (FLONASE) 50 MCG/ACT nasal spray Place 2 sprays into both nostrils daily. 48 g 0   folic acid (FOLVITE) 1 MG tablet TAKE ONE TABLET BY MOUTH EVERY MORNING 30 tablet 2   glucose blood (ONETOUCH VERIO) test strip 1 each  by Other route 2 (two) times daily. And lancets 2/day 100 each 12   Insulin NPH, Human,, Isophane, (NOVOLIN N FLEXPEN) 100 UNIT/ML Kiwkpen Inject 28 Units into the skin every morning. And pen needles 1/day 30 mL 11   Insulin Pen Needle (PEN NEEDLES) 32G X 5 MM MISC 1 each by Does not apply route daily. 100 each 3   Lancets (ONETOUCH DELICA PLUS [id number]) MISC USE TO test four times A DAY 400 each 0   liraglutide (VICTOZA) 18 MG/3ML SOPN Inject 0.6 mg into the skin every morning. 27 mL 0   mirtazapine (REMERON) 7.5 MG tablet Take 7.5 mg by mouth at bedtime.     omeprazole (PRILOSEC) 40 MG capsule TAKE ONE CAPSULE BY MOUTH EVERY MORNING 30 capsule 2   rivastigmine  (EXELON) 4.6 mg/24hr Place 1 patch (4.6 mg total) onto the skin daily. 90 patch 3   simvastatin (ZOCOR) 20 MG tablet TAKE ONE TABLET BY MOUTH EVERYDAY AT BEDTIME 30 tablet 2   traZODone (DESYREL) 50 MG tablet Take 50 mg by mouth at bedtime.      No current facility-administered medications for this visit.    SURGICAL HISTORY:  Past Surgical History:  Procedure Laterality Date   ANKLE RECONSTRUCTION     right   CARPAL TUNNEL RELEASE     CORONARY ANGIOPLASTY      REVIEW OF SYSTEMS:  A comprehensive review of systems was negative except for: Constitutional: positive for fatigue   PHYSICAL EXAMINATION: General appearance: alert, cooperative, fatigued, and no distress Head: Normocephalic, without obvious abnormality, atraumatic Neck: no adenopathy, no JVD, supple, symmetrical, trachea midline, and thyroid not enlarged, symmetric, no tenderness/mass/nodules Lymph nodes: Cervical, supraclavicular, and axillary nodes normal. Resp: clear to auscultation bilaterally Back: symmetric, no curvature. ROM normal. No CVA tenderness. Cardio: regular rate and rhythm, S1, S2 normal, no murmur, click, rub or gallop GI: soft, non-tender; bowel sounds normal; no masses,  no organomegaly Extremities: extremities normal, atraumatic, no cyanosis or edema  ECOG PERFORMANCE STATUS: 1 - Symptomatic but completely ambulatory  Blood pressure 137/66, pulse (!) 59, temperature (!) 97.5 F (36.4 C), temperature source Tympanic, resp. rate 18, weight 144 lb 2 oz (65.4 kg), SpO2 100 %.  LABORATORY DATA: Lab Results  Component Value Date   WBC 7.2 11/28/2020   HGB 9.5 (L) 11/28/2020   HCT 28.0 (L) 11/28/2020   MCV 90.9 11/28/2020   PLT 282 11/28/2020      Chemistry      Component Value Date/Time   NA 136 09/25/2020 1841   NA 131 (L) 09/19/2020 1521   NA 137 05/29/2011 1946   K 4.1 09/25/2020 1841   K 3.9 05/29/2011 1946   CL 103 09/25/2020 1841   CL 100 05/29/2011 1946   CO2 28 09/25/2020 1841    CO2 28 05/29/2011 1946   BUN 33 (H) 09/25/2020 1841   BUN 27 09/19/2020 1521   BUN 24 (H) 05/29/2011 1946   CREATININE 2.53 (H) 09/25/2020 1841   CREATININE 1.52 (H) 04/08/2018 1154   CREATININE 1.56 (H) 01/01/2017 0941      Component Value Date/Time   CALCIUM 9.8 09/25/2020 1841   CALCIUM 9.8 05/29/2011 1946   ALKPHOS 57 09/25/2020 1841   ALKPHOS 86 05/29/2011 1946   AST 19 09/25/2020 1841   AST 14 (L) 04/08/2018 1154   ALT 12 09/25/2020 1841   ALT 13 04/08/2018 1154   ALT 15 05/29/2011 1946   BILITOT 0.5 09/25/2020 1841   BILITOT 0.3 06/07/2020  1201   BILITOT 0.4 04/08/2018 1154       RADIOGRAPHIC STUDIES: No results found.   ASSESSMENT AND PLAN: This is a very pleasant 75 years old African-American female with anemia of chronic disease secondary to chronic renal insufficiency.  The patient has been on treatment with Retacrit 40,000 units every 2 weeks and has been tolerating this treatment well.   The patient has been off her injection for several months now. Repeat CBC showed hemoglobin of 9.5.  The iron study and ferritin were unremarkable except for the elevated serum ferritin secondary to chronic inflammatory process. I recommended for the patient to resume her treatment with Retacrit 40,000 unit every 3 weeks in addition to the oral iron tablets. I will see her back for follow-up visit in 3 months for evaluation and repeat blood work. The patient was advised to call immediately if she has any concerning symptoms in the interval. The patient voices understanding of current disease status and treatment options and is in agreement with the current care plan. All questions were answered. The patient knows to call the clinic with any problems, questions or concerns. We can certainly see the patient much sooner if necessary.   Disclaimer: This note was dictated with voice recognition software. Similar sounding words can inadvertently be transcribed and may not be corrected  upon review.

## 2020-11-29 ENCOUNTER — Other Ambulatory Visit: Payer: Self-pay

## 2020-11-29 NOTE — Patient Outreach (Signed)
[facility]) Care Management  [place]  11/29/2020   Courtney Goodman 29-May-1945 [id number]  Subjective: Telephone call to daughter [name]. She reports patient doing good.  Diabetes management continues.  No concerns.    Objective:   Encounter Medications:  Outpatient Encounter Medications as of 11/29/2020  Medication Sig   albuterol (PROVENTIL HFA;VENTOLIN HFA) 108 (90 Base) MCG/ACT inhaler Inhale 2 puffs into the lungs every 6 (six) hours as needed for wheezing or shortness of breath.   Alcohol Swabs (ALCOHOL PREP) PADS Use for testing (Patient taking differently: 1 each by Other route 2 (two) times daily. E11.9)   amLODipine-benazepril (LOTREL) 5-20 MG capsule Take 1 capsule by mouth every morning.   aspirin EC 81 MG tablet Take 81 mg by mouth daily.    Biotin 10000 MCG TABS Take 1 tablet by mouth daily.    carvedilol (COREG) 6.25 MG tablet TAKE ONE TABLET BY MOUTH EVERY MORNING and TAKE ONE TABLET BY MOUTH EVERYDAY AT BEDTIME   Cholecalciferol (VITAMIN D3) 25 MCG (1000 UT) CAPS Take 1 capsule by mouth daily.   Cholecalciferol 25 MCG (1000 UT) tablet Take 1 tablet by mouth daily.   citalopram (CELEXA) 20 MG tablet Take 1 tablet (20 mg total) by mouth daily.   fenofibrate (TRICOR) 145 MG tablet TAKE ONE TABLET BY MOUTH ONCE DAILY   fluticasone (FLONASE) 50 MCG/ACT nasal spray Place 2 sprays into both nostrils daily.   folic acid (FOLVITE) 1 MG tablet TAKE ONE TABLET BY MOUTH EVERY MORNING   glucose blood (ONETOUCH VERIO) test strip 1 each by Other route 2 (two) times daily. And lancets 2/day   Insulin NPH, Human,, Isophane, (NOVOLIN N FLEXPEN) 100 UNIT/ML Kiwkpen Inject 28 Units into the skin every morning. And pen needles 1/day   Insulin Pen Needle (PEN NEEDLES) 32G X 5 MM MISC 1 each by Does not apply route daily.   Lancets (ONETOUCH DELICA PLUS [id number]) MISC USE TO test four times A DAY   liraglutide (VICTOZA) 18 MG/3ML SOPN Inject 0.6 mg into the skin  every morning.   mirtazapine (REMERON) 7.5 MG tablet Take 7.5 mg by mouth at bedtime.   omeprazole (PRILOSEC) 40 MG capsule TAKE ONE CAPSULE BY MOUTH EVERY MORNING   rivastigmine (EXELON) 4.6 mg/24hr Place 1 patch (4.6 mg total) onto the skin daily.   simvastatin (ZOCOR) 20 MG tablet TAKE ONE TABLET BY MOUTH EVERYDAY AT BEDTIME   traZODone (DESYREL) 50 MG tablet Take 50 mg by mouth at bedtime.    No facility-administered encounter medications on file as of 11/29/2020.    Functional Status:  In your present state of health, do you have any difficulty performing the following activities: 11/29/2020 06/01/2020  Hearing? Y Y  Comment no hearing aids -  Vision? N N  Difficulty concentrating or making decisions? N N  Walking or climbing stairs? N N  Dressing or bathing? Y Y  Comment tiredness tiredness  Doing errands, shopping? [name]  Comment daughter handles daughter Engineer, building services and eating ? N N  Using the Toilet? N N  In the past six months, have you accidently leaked urine? N N  Do you have problems with loss of bowel control? N N  Managing your Medications? [name]  Comment family helps family helps  Managing your Finances? [name]  Comment family helps family helps  Housekeeping or managing your Housekeeping? [name]  Comment family helps family assists  Some recent data might be hidden  Fall/Depression Screening: Fall Risk  11/29/2020 11/13/2020 08/31/2020  Falls in the past year? 1 1 1   Number falls in past yr: 1 1 1   Injury with Fall? 0 0 1  Risk Factor Category  - - -  Risk for fall due to : History of fall(s) - History of fall(s)  Follow up Falls prevention discussed - Falls prevention discussed   PHQ 2/9 Scores 11/29/2020 08/31/2020 06/07/2020 06/01/2020 12/30/2019 10/05/2019 10/05/2019  PHQ - 2 Score - - 0 - - 0 0  PHQ- 9 Score - - 18 - - - -  Exception Documentation Other- indicate reason in comment box Other- indicate reason in comment box - Other- indicate reason in comment box  Other- indicate reason in comment box - -  Not completed - daughter dana does assessment - daughter [name] daughter [name] completed assessment - -    Assessment:   [place] : Diabetes Type 2 (Adult)  Updates made by [name], RN since 11/29/2020 12:00 AM     Problem: Glycemic Management (Diabetes, Type 1)      Long-Range Goal: Glycemic Management Optimized as evidenced by A1c less than 9 Completed 11/29/2020  Start Date: 08/31/2020  Expected End Date: 01/27/2021  Priority: High  Note:   Evidence-based guidance:  Anticipate A1C testing (point-of-care) every 3 to 6 months based on goal attainment.  Review mutually set A1C goal or target range.  Anticipate screening for autoimmune thyroid disease, vitamin B12 deficiency with anemia and celiac disease based on presenting signs/symptoms.  Anticipate use of insulin with periodic adjustments; consider active involvement of pharmacist.  Provide medical nutrition therapy and development of individualized eating plan.  Compare self-reported symptoms of hypo or hyperglycemia to blood glucose levels, diet and fluid intake, current medications, psychosocial and physiologic stressors, change in activity and barriers to care adherence.  Promote self-monitoring of blood glucose levels, interval or continuous.  Assess and address barriers to management plan, such as food insecurity, age, developmental ability, depression, anxiety, fear of hypoglycemia or weight gain, as well as medication cost, side effects and complicated regimen.  Assess for disordered eating behaviors based on presenting signs/symptoms and risk factors.  Consider referral to community-based diabetes education program, visiting nurse, community health worker or health coach.  Encourage regular dental care for treatment of periodontal disease; refer to dental provider when needed.   Notes: 03/04/07 Duplicate goal    Task: Alleviate Barriers to Glycemic  Management Completed 11/29/2020  Due Date: 01/27/2021  Priority: Routine  Responsible User: [name], RN  Note:   Care Management Activities:    - blood glucose monitoring encouraged - self-awareness of signs/symptoms of hypo or hyperglycemia encouraged - use of blood glucose monitoring log promoted    Notes: 08/31/20 Patient A1c up to 8.9. Discussed diabetes and importance of control.  Diabetes Management Discussed: Medication adherence Reviewed importance of limiting carbs such as rice, potatoes, breads, and pastas. Also discussed limiting sweets and sugary drinks.  Discussed importance of portion control.  Also discussed importance of annual exams, foot exams, and eye exams.       Care Plan : RN Care Manager Plan of Care  Updates made by [name], RN since 11/29/2020 12:00 AM     Problem: Chronic Disease Management and Care Coordination (Diabetes)   Priority: High     Long-Range Goal: Development of Plan of Care for management of diabetes   Start Date: 11/29/2020  Expected End Date: 07/27/2021  Priority: High  Note:   Current Barriers:  Knowledge Deficits related to plan of care for management of DMII Chronic Disease Management support and education needs related to DMII Unable to independently self manage diabetes  RNCM Clinical Goal(s):  Patient will verbalize understanding of plan for management of DMII verbalize basic understanding of  DMII disease process and self health management plan for diabetes take all medications exactly as prescribed and will call provider for medication related questions continue to work with RN Care Manager to address care management and care coordination needs related to  DMII through collaboration with RN Care manager, provider, and care team.   Interventions: "Provided education to patient re: diabetes, diabetes diet, complications of uncontrolled blood sugar Inter-disciplinary care team collaboration (see longitudinal plan of  care) Evaluation of current treatment plan related to  self management and patient's adherence to plan as established by provider   Diabetes Interventions: Assessed patient's understanding of A1c goal: <8% Provided education to patient about basic DM disease process; Discussed plans with patient for ongoing care management follow up and provided patient with direct contact information for care management team; Diabetes Management Discussed Medication adherence, Reviewed importance of limiting carbs such as rice, potatoes, breads, and pastas. Also discussed limiting sweets and sugary drinks.  Discussed importance of portion control.  Also discussed importance of annual exams, foot exams, and eye exams.   Lab Results  Component Value Date   HGBA1C 10.6 (A) 09/12/2020   Patient Goals/Self-Care Activities: Patient will self administer medications as prescribed Patient will continue to perform ADL's independently Family and PCS worker will continue to assist with IADL's  Follow Up Plan:  Telephone follow up appointment with care management team member scheduled for:  February The patient has been provided with contact information for the care management team and has been advised to call with any health related questions or concerns.        Goals Addressed             This Visit's Progress    COMPLETED: [facility]-Monitor and Manage My Blood Sugar       Barriers: Health Behaviors Timeframe:  Long-Range Goal Priority:  High Start Date:   03/02/20                          Expected End Date:     01/27/21                Follow Up Date 12/27/20  - check blood sugar at prescribed times - check blood sugar if I feel it is too high or too low - take the blood sugar log to all doctor visits    Why is this important?   Checking your blood sugar at home helps to keep it from getting very high or very low.  Writing the results in a diary or log helps the doctor know how to care for you.  Your  blood sugar log should have the time, date and the results.  Also, write down the amount of insulin or other medicine that you take.  Other information, like what you ate, exercise done and how you were feeling, will also be helpful.     Notes: Patient keeps log of blood sugars.  A1c 7.9. 03/02/20 Patient sugars ranging from around 95- to the 200's.  Patient sometimes does not follow diet.   06/01/20 Keep up the great work!! Monitor for signs of hypoglycemia.  08/31/20 Patient A1c up to  8.9. Discussed diabetes and importance of control.  Diabetes Management Discussed: Medication adherence Reviewed importance of limiting carbs such as rice, potatoes, breads, and pastas. Also discussed limiting sweets and sugary drinks.  Discussed importance of portion control.  Also discussed importance of annual exams, foot exams, and eye exams.    52/8/41 duplicate goal        Plan:  Follow-up: Patient agrees to Care Plan and Follow-up. Follow-up in 3 month(s)  Jone Baseman, RN, MSN [place] Management Care Management Coordinator Direct Line [phone] Cell [phone] Toll Free: [phone]  Fax: [phone]

## 2020-11-29 NOTE — Patient Instructions (Signed)
Care Plan : Diabetes Type 2 (Adult)  Updates made by [name], RN since 11/29/2020 12:00 AM     Problem: Glycemic Management (Diabetes, Type 1)      Long-Range Goal: Glycemic Management Optimized as evidenced by A1c less than 9 Completed 11/29/2020  Start Date: 08/31/2020  Expected End Date: 01/27/2021  Priority: High  Note:   Evidence-based guidance:  Anticipate A1C testing (point-of-care) every 3 to 6 months based on goal attainment.  Review mutually set A1C goal or target range.  Anticipate screening for autoimmune thyroid disease, vitamin B12 deficiency with anemia and celiac disease based on presenting signs/symptoms.  Anticipate use of insulin with periodic adjustments; consider active involvement of pharmacist.  Provide medical nutrition therapy and development of individualized eating plan.  Compare self-reported symptoms of hypo or hyperglycemia to blood glucose levels, diet and fluid intake, current medications, psychosocial and physiologic stressors, change in activity and barriers to care adherence.  Promote self-monitoring of blood glucose levels, interval or continuous.  Assess and address barriers to management plan, such as food insecurity, age, developmental ability, depression, anxiety, fear of hypoglycemia or weight gain, as well as medication cost, side effects and complicated regimen.  Assess for disordered eating behaviors based on presenting signs/symptoms and risk factors.  Consider referral to community-based diabetes education program, visiting nurse, community health worker or health coach.  Encourage regular dental care for treatment of periodontal disease; refer to dental provider when needed.   Notes: 29/5/62 Duplicate goal    Task: Alleviate Barriers to Glycemic Management Completed 11/29/2020  Due Date: 01/27/2021  Priority: Routine  Responsible User: [name], RN  Note:   Care Management Activities:    - blood glucose monitoring encouraged -  self-awareness of signs/symptoms of hypo or hyperglycemia encouraged - use of blood glucose monitoring log promoted    Notes: 08/31/20 Patient A1c up to 8.9. Discussed diabetes and importance of control.  Diabetes Management Discussed: Medication adherence Reviewed importance of limiting carbs such as rice, potatoes, breads, and pastas. Also discussed limiting sweets and sugary drinks.  Discussed importance of portion control.  Also discussed importance of annual exams, foot exams, and eye exams.       Care Plan : RN Care Manager Plan of Care  Updates made by [name], RN since 11/29/2020 12:00 AM     Problem: Chronic Disease Management and Care Coordination (Diabetes)   Priority: High     Long-Range Goal: Development of Plan of Care for management of diabetes   Start Date: 11/29/2020  Expected End Date: 07/27/2021  Priority: High  Note:   Current Barriers:  Knowledge Deficits related to plan of care for management of DMII Chronic Disease Management support and education needs related to DMII Unable to independently self manage diabetes  RNCM Clinical Goal(s):  Patient will verbalize understanding of plan for management of DMII verbalize basic understanding of  DMII disease process and self health management plan for diabetes take all medications exactly as prescribed and will call provider for medication related questions continue to work with RN Care Manager to address care management and care coordination needs related to  DMII through collaboration with RN Care manager, provider, and care team.   Interventions: "Provided education to patient re: diabetes, diabetes diet, complications of uncontrolled blood sugar Inter-disciplinary care team collaboration (see longitudinal plan of care) Evaluation of current treatment plan related to  self management and patient's adherence to plan as established by provider   Diabetes Interventions: Assessed patient's understanding of  A1c  goal: <8% Provided education to patient about basic DM disease process; Discussed plans with patient for ongoing care management follow up and provided patient with direct contact information for care management team; Diabetes Management Discussed Medication adherence, Reviewed importance of limiting carbs such as rice, potatoes, breads, and pastas. Also discussed limiting sweets and sugary drinks.  Discussed importance of portion control.  Also discussed importance of annual exams, foot exams, and eye exams.   Lab Results  Component Value Date   HGBA1C 10.6 (A) 09/12/2020   Patient Goals/Self-Care Activities: Patient will self administer medications as prescribed Patient will continue to perform ADL's independently Family and PCS worker will continue to assist with IADL's  Follow Up Plan:  Telephone follow up appointment with care management team member scheduled for:  February The patient has been provided with contact information for the care management team and has been advised to call with any health related questions or concerns.

## 2020-11-30 ENCOUNTER — Telehealth: Payer: Self-pay | Admitting: Internal Medicine

## 2020-11-30 NOTE — Telephone Encounter (Signed)
Scheduled follow-up appointments per 11/1 los. Patient's daughter is aware and opted to schedule injections upon next visit.

## 2020-12-02 ENCOUNTER — Other Ambulatory Visit: Payer: Self-pay | Admitting: Nurse Practitioner

## 2020-12-06 ENCOUNTER — Other Ambulatory Visit: Payer: Self-pay

## 2020-12-06 ENCOUNTER — Inpatient Hospital Stay: Payer: Medicare HMO

## 2020-12-06 VITALS — BP 153/70 | HR 57 | Temp 97.9°F | Resp 18

## 2020-12-06 DIAGNOSIS — N183 Chronic kidney disease, stage 3 unspecified: Secondary | ICD-10-CM

## 2020-12-06 DIAGNOSIS — I129 Hypertensive chronic kidney disease with stage 1 through stage 4 chronic kidney disease, or unspecified chronic kidney disease: Secondary | ICD-10-CM | POA: Diagnosis not present

## 2020-12-06 DIAGNOSIS — Z79899 Other long term (current) drug therapy: Secondary | ICD-10-CM | POA: Diagnosis not present

## 2020-12-06 DIAGNOSIS — E785 Hyperlipidemia, unspecified: Secondary | ICD-10-CM | POA: Diagnosis not present

## 2020-12-06 DIAGNOSIS — I2510 Atherosclerotic heart disease of native coronary artery without angina pectoris: Secondary | ICD-10-CM | POA: Diagnosis not present

## 2020-12-06 DIAGNOSIS — M8580 Other specified disorders of bone density and structure, unspecified site: Secondary | ICD-10-CM | POA: Diagnosis not present

## 2020-12-06 DIAGNOSIS — J449 Chronic obstructive pulmonary disease, unspecified: Secondary | ICD-10-CM | POA: Diagnosis not present

## 2020-12-06 DIAGNOSIS — E119 Type 2 diabetes mellitus without complications: Secondary | ICD-10-CM | POA: Diagnosis not present

## 2020-12-06 DIAGNOSIS — D631 Anemia in chronic kidney disease: Secondary | ICD-10-CM

## 2020-12-06 MED ORDER — EPOETIN ALFA-EPBX 40000 UNIT/ML IJ SOLN
40000.0000 [IU] | Freq: Once | INTRAMUSCULAR | Status: AC
  Administered 2020-12-06: 40000 [IU] via SUBCUTANEOUS
  Filled 2020-12-06: qty 1

## 2020-12-06 NOTE — Patient Instructions (Signed)
Epoetin Alfa injection °What is this medication? °EPOETIN ALFA (e POE e tin AL fa) helps your body make more red blood cells. This medicine is used to treat anemia caused by chronic kidney disease, cancer chemotherapy, or HIV-therapy. It may also be used before surgery if you have anemia. °This medicine may be used for other purposes; ask your health care provider or pharmacist if you have questions. °COMMON BRAND NAME(S): Epogen, Procrit, Retacrit °What should I tell my care team before I take this medication? °They need to know if you have any of these conditions: °cancer °heart disease °high blood pressure °history of blood clots °history of stroke °low levels of folate, iron, or vitamin B12 in the blood °seizures °an unusual or allergic reaction to erythropoietin, albumin, benzyl alcohol, hamster proteins, other medicines, foods, dyes, or preservatives °pregnant or trying to get pregnant °breast-feeding °How should I use this medication? °This medicine is for injection into a vein or under the skin. It is usually given by a health care professional in a hospital or clinic setting. °If you get this medicine at home, you will be taught how to prepare and give this medicine. Use exactly as directed. Take your medicine at regular intervals. Do not take your medicine more often than directed. °It is important that you put your used needles and syringes in a special sharps container. Do not put them in a trash can. If you do not have a sharps container, call your pharmacist or healthcare provider to get one. °A special MedGuide will be given to you by the pharmacist with each prescription and refill. Be sure to read this information carefully each time. °Talk to your pediatrician regarding the use of this medicine in children. While this drug may be prescribed for selected conditions, precautions do apply. °Overdosage: If you think you have taken too much of this medicine contact a poison control center or emergency  room at once. °NOTE: This medicine is only for you. Do not share this medicine with others. °What if I miss a dose? °If you miss a dose, take it as soon as you can. If it is almost time for your next dose, take only that dose. Do not take double or extra doses. °What may interact with this medication? °Interactions have not been studied. °This list may not describe all possible interactions. Give your health care provider a list of all the medicines, herbs, non-prescription drugs, or dietary supplements you use. Also tell them if you smoke, drink alcohol, or use illegal drugs. Some items may interact with your medicine. °What should I watch for while using this medication? °Your condition will be monitored carefully while you are receiving this medicine. °You may need blood work done while you are taking this medicine. °This medicine may cause a decrease in vitamin B6. You should make sure that you get enough vitamin B6 while you are taking this medicine. Discuss the foods you eat and the vitamins you take with your health care professional. °What side effects may I notice from receiving this medication? °Side effects that you should report to your doctor or health care professional as soon as possible: °allergic reactions like skin rash, itching or hives, swelling of the face, lips, or tongue °seizures °signs and symptoms of a blood clot such as breathing problems; changes in vision; chest pain; severe, sudden headache; pain, swelling, warmth in the leg; trouble speaking; sudden numbness or weakness of the face, arm or leg °signs and symptoms of a stroke like   changes in vision; confusion; trouble speaking or understanding; severe headaches; sudden numbness or weakness of the face, arm or leg; trouble walking; dizziness; loss of balance or coordination °Side effects that usually do not require medical attention (report to your doctor or health care professional if they continue or are  bothersome): °chills °cough °dizziness °fever °headaches °joint pain °muscle cramps °muscle pain °nausea, vomiting °pain, redness, or irritation at site where injected °This list may not describe all possible side effects. Call your doctor for medical advice about side effects. You may report side effects to FDA at 1-800-FDA-1088. °Where should I keep my medication? °Keep out of the reach of children. °Store in a refrigerator between 2 and 8 degrees C (36 and 46 degrees F). Do not freeze or shake. Throw away any unused portion if using a single-dose vial. Multi-dose vials can be kept in the refrigerator for up to 21 days after the initial dose. Throw away unused medicine. °NOTE: This sheet is a summary. It may not cover all possible information. If you have questions about this medicine, talk to your doctor, pharmacist, or health care provider. °© 2022 [employer]vier/Gold Standard (2016-09-17 00:00:00) ° °

## 2020-12-06 NOTE — Progress Notes (Signed)
Per MD use labs from 11/28/20

## 2020-12-30 ENCOUNTER — Emergency Department (HOSPITAL_COMMUNITY): Payer: Medicare HMO

## 2020-12-30 ENCOUNTER — Encounter (HOSPITAL_COMMUNITY): Payer: Self-pay

## 2020-12-30 ENCOUNTER — Emergency Department (HOSPITAL_COMMUNITY)
Admission: EM | Admit: 2020-12-30 | Discharge: 2020-12-30 | Disposition: A | Discharge status: Home / Self Care | Payer: Medicare HMO | Attending: Emergency Medicine | Admitting: Emergency Medicine

## 2020-12-30 ENCOUNTER — Other Ambulatory Visit: Payer: Self-pay

## 2020-12-30 DIAGNOSIS — Z7982 Long term (current) use of aspirin: Secondary | ICD-10-CM | POA: Insufficient documentation

## 2020-12-30 DIAGNOSIS — R110 Nausea: Secondary | ICD-10-CM | POA: Insufficient documentation

## 2020-12-30 DIAGNOSIS — R410 Disorientation, unspecified: Secondary | ICD-10-CM | POA: Insufficient documentation

## 2020-12-30 DIAGNOSIS — Z20822 Contact with and (suspected) exposure to covid-19: Secondary | ICD-10-CM | POA: Insufficient documentation

## 2020-12-30 DIAGNOSIS — R631 Polydipsia: Secondary | ICD-10-CM | POA: Diagnosis present

## 2020-12-30 DIAGNOSIS — I1310 Hypertensive heart and chronic kidney disease without heart failure, with stage 1 through stage 4 chronic kidney disease, or unspecified chronic kidney disease: Secondary | ICD-10-CM | POA: Insufficient documentation

## 2020-12-30 DIAGNOSIS — I10 Essential (primary) hypertension: Secondary | ICD-10-CM | POA: Diagnosis not present

## 2020-12-30 DIAGNOSIS — R739 Hyperglycemia, unspecified: Secondary | ICD-10-CM | POA: Diagnosis not present

## 2020-12-30 DIAGNOSIS — Z794 Long term (current) use of insulin: Secondary | ICD-10-CM | POA: Diagnosis not present

## 2020-12-30 DIAGNOSIS — R531 Weakness: Secondary | ICD-10-CM | POA: Insufficient documentation

## 2020-12-30 DIAGNOSIS — J3489 Other specified disorders of nose and nasal sinuses: Secondary | ICD-10-CM | POA: Diagnosis not present

## 2020-12-30 DIAGNOSIS — J45909 Unspecified asthma, uncomplicated: Secondary | ICD-10-CM | POA: Diagnosis not present

## 2020-12-30 DIAGNOSIS — E162 Hypoglycemia, unspecified: Secondary | ICD-10-CM | POA: Diagnosis not present

## 2020-12-30 DIAGNOSIS — E1122 Type 2 diabetes mellitus with diabetic chronic kidney disease: Secondary | ICD-10-CM | POA: Insufficient documentation

## 2020-12-30 DIAGNOSIS — I2510 Atherosclerotic heart disease of native coronary artery without angina pectoris: Secondary | ICD-10-CM | POA: Insufficient documentation

## 2020-12-30 DIAGNOSIS — R1084 Generalized abdominal pain: Secondary | ICD-10-CM | POA: Diagnosis not present

## 2020-12-30 DIAGNOSIS — Z7951 Long term (current) use of inhaled steroids: Secondary | ICD-10-CM | POA: Diagnosis not present

## 2020-12-30 DIAGNOSIS — R109 Unspecified abdominal pain: Secondary | ICD-10-CM | POA: Diagnosis not present

## 2020-12-30 DIAGNOSIS — Z79899 Other long term (current) drug therapy: Secondary | ICD-10-CM | POA: Diagnosis not present

## 2020-12-30 DIAGNOSIS — Z87891 Personal history of nicotine dependence: Secondary | ICD-10-CM | POA: Insufficient documentation

## 2020-12-30 DIAGNOSIS — R630 Anorexia: Secondary | ICD-10-CM | POA: Insufficient documentation

## 2020-12-30 DIAGNOSIS — J449 Chronic obstructive pulmonary disease, unspecified: Secondary | ICD-10-CM | POA: Insufficient documentation

## 2020-12-30 DIAGNOSIS — N184 Chronic kidney disease, stage 4 (severe): Secondary | ICD-10-CM | POA: Diagnosis not present

## 2020-12-30 DIAGNOSIS — K449 Diaphragmatic hernia without obstruction or gangrene: Secondary | ICD-10-CM | POA: Diagnosis not present

## 2020-12-30 DIAGNOSIS — E1165 Type 2 diabetes mellitus with hyperglycemia: Secondary | ICD-10-CM | POA: Diagnosis not present

## 2020-12-30 DIAGNOSIS — E161 Other hypoglycemia: Secondary | ICD-10-CM | POA: Diagnosis not present

## 2020-12-30 DIAGNOSIS — R42 Dizziness and giddiness: Secondary | ICD-10-CM | POA: Diagnosis not present

## 2020-12-30 DIAGNOSIS — R5381 Other malaise: Secondary | ICD-10-CM | POA: Diagnosis not present

## 2020-12-30 LAB — CBC WITH DIFFERENTIAL/PLATELET
Abs Immature Granulocytes: 0.02 10*3/uL (ref 0.00–0.07)
Basophils Absolute: 0 10*3/uL (ref 0.0–0.1)
Basophils Relative: 0 %
Eosinophils Absolute: 0 10*3/uL (ref 0.0–0.5)
Eosinophils Relative: 1 %
HCT: 31.3 % — ABNORMAL LOW (ref 36.0–46.0)
Hemoglobin: 10.1 g/dL — ABNORMAL LOW (ref 12.0–15.0)
Immature Granulocytes: 0 %
Lymphocytes Relative: 25 %
Lymphs Abs: 1.7 10*3/uL (ref 0.7–4.0)
MCH: 31.1 pg (ref 26.0–34.0)
MCHC: 32.3 g/dL (ref 30.0–36.0)
MCV: 96.3 fL (ref 80.0–100.0)
Monocytes Absolute: 0.6 10*3/uL (ref 0.1–1.0)
Monocytes Relative: 8 %
Neutro Abs: 4.7 10*3/uL (ref 1.7–7.7)
Neutrophils Relative %: 66 %
Platelets: 281 10*3/uL (ref 150–400)
RBC: 3.25 MIL/uL — ABNORMAL LOW (ref 3.87–5.11)
RDW: 14.6 % (ref 11.5–15.5)
WBC: 7.1 10*3/uL (ref 4.0–10.5)
nRBC: 0 % (ref 0.0–0.2)

## 2020-12-30 LAB — CBG MONITORING, ED
Glucose-Capillary: 414 mg/dL — ABNORMAL HIGH (ref 70–99)
Glucose-Capillary: 169 mg/dL — ABNORMAL HIGH (ref 70–99)

## 2020-12-30 LAB — COMPREHENSIVE METABOLIC PANEL
ALT: 13 U/L (ref 0–44)
AST: 20 U/L (ref 15–41)
Albumin: 3.2 g/dL — ABNORMAL LOW (ref 3.5–5.0)
Alkaline Phosphatase: 45 U/L (ref 38–126)
Anion gap: 6 (ref 5–15)
BUN: 46 mg/dL — ABNORMAL HIGH (ref 8–23)
CO2: 24 mmol/L (ref 22–32)
Calcium: 9.2 mg/dL (ref 8.9–10.3)
Chloride: 102 mmol/L (ref 98–111)
Creatinine, Ser: 2.21 mg/dL — ABNORMAL HIGH (ref 0.44–1.00)
GFR, Estimated: 23 mL/min — ABNORMAL LOW (ref >60)
Glucose, Bld: 455 mg/dL — ABNORMAL HIGH (ref 70–99)
Potassium: 4.5 mmol/L (ref 3.5–5.1)
Sodium: 132 mmol/L — ABNORMAL LOW (ref 135–145)
Total Bilirubin: 0.5 mg/dL (ref 0.3–1.2)
Total Protein: 6.8 g/dL (ref 6.5–8.1)

## 2020-12-30 LAB — URINALYSIS, ROUTINE W REFLEX MICROSCOPIC
Bilirubin Urine: NEGATIVE
Glucose, UA: >=500 mg/dL — AB
Hgb urine dipstick: NEGATIVE
Ketones, ur: NEGATIVE mg/dL
Leukocytes,Ua: NEGATIVE
Nitrite: NEGATIVE
Protein, ur: NEGATIVE mg/dL
Specific Gravity, Urine: 1.016 (ref 1.005–1.030)
pH: 5 (ref 5.0–8.0)

## 2020-12-30 LAB — I-STAT VENOUS BLOOD GAS, ED
Acid-base deficit: 1 mmol/L (ref 0.0–2.0)
Bicarbonate: 25.4 mmol/L (ref 20.0–28.0)
Calcium, Ion: 1.27 mmol/L (ref 1.15–1.40)
HCT: 33 % — ABNORMAL LOW (ref 36.0–46.0)
Hemoglobin: 11.2 g/dL — ABNORMAL LOW (ref 12.0–15.0)
O2 Saturation: 98 %
Potassium: 4.5 mmol/L (ref 3.5–5.1)
Sodium: 136 mmol/L (ref 135–145)
TCO2: 27 mmol/L (ref 22–32)
pCO2, Ven: 46.5 mmHg (ref 44.0–60.0)
pH, Ven: 7.346 (ref 7.250–7.430)
pO2, Ven: 116 mmHg — ABNORMAL HIGH (ref 32.0–45.0)

## 2020-12-30 LAB — RESP PANEL BY RT-PCR (FLU A&B, COVID) ARPGX2
Influenza A by PCR: NEGATIVE
Influenza B by PCR: NEGATIVE
SARS Coronavirus 2 by RT PCR: NEGATIVE

## 2020-12-30 LAB — LIPASE, BLOOD: Lipase: 43 U/L (ref 11–51)

## 2020-12-30 MED ORDER — SODIUM CHLORIDE 0.9 % IV BOLUS
1000.0000 mL | Freq: Once | INTRAVENOUS | Status: AC
  Administered 2020-12-30: 1000 mL via INTRAVENOUS

## 2020-12-30 MED ORDER — INSULIN ASPART 100 UNIT/ML IJ SOLN
10.0000 [IU] | Freq: Once | INTRAMUSCULAR | Status: AC
  Administered 2020-12-30: 10 [IU] via SUBCUTANEOUS

## 2020-12-30 MED ORDER — ONDANSETRON HCL 4 MG/2ML IJ SOLN
4.0000 mg | Freq: Once | INTRAMUSCULAR | Status: AC
  Administered 2020-12-30: 4 mg via INTRAVENOUS
  Filled 2020-12-30: qty 2

## 2020-12-30 NOTE — ED Provider Notes (Signed)
[facility] EMERGENCY DEPARTMENT Provider Note   CSN: [id number] Arrival date & time: 12/30/20  1723     History Chief Complaint  Patient presents with   Hyperglycemia   Abdominal Pain    Courtney Goodman is a 75 y.o. female.  The history is provided by the patient, medical records and the EMS personnel. No language interpreter was used.  Hyperglycemia Associated symptoms: abdominal pain   Abdominal Pain  75 year old female significant history of diabetes, CKD, hypertension who presents complaining of abdominal pain.  History is a bit a bit difficult to obtain as patient appears to be slightly altered.  Patient brought here via EMS from home.  For the past week patient endorsed having abdominal pain feeling nauseous feeling dizzy and weak.  She felt that it is related to her diabetes.  She does endorse increased thirst and urinary frequency.  She did report some runny nose but denies sneezing coughing sore throat chest pain shortness of breath or trouble urinating.  Patient reports she is taking her medication regularly.  She does have history of medication noncompliance due to cognitive impairment.  Past Medical History:  Diagnosis Date   Anemia of chronic disease    Asthma    Atherosclerotic heart disease of native coronary artery without angina pectoris    CKD (chronic kidney disease)    COPD (chronic obstructive pulmonary disease) (HCC)    DM (diabetes mellitus), type 2 with renal complications (HCC)    Dysrhythmia    Elevated ferritin 01/03/2017   GAD (generalized anxiety disorder)    Hypertension    Mixed hyperlipidemia due to type 2 diabetes mellitus ([place])    Mixed incontinence    per medical records from [facility]   Osteopenia    Personal history of noncompliance with medical treatment, presenting hazards to health    Shortness of breath dyspnea    Suicidal ideation    TB (tuberculosis), treated    age 41   Vitamin D deficiency     Patient Active  Problem List   Diagnosis Date Noted   Abnormal thyroid exam 09/19/2020   Weight loss 09/12/2020   Syncope 09/12/2020   Uncontrolled type 2 diabetes mellitus with hyperglycemia, with long-term current use of insulin ([place]) 09/12/2020   CKD (chronic kidney disease), stage IV ([place]) 09/12/2020   Hypercalcemia 06/18/2020   Multiple comorbid conditions 06/18/2020   AMS (altered mental status) 06/16/2020   Hyponatremia 06/16/2020   Poor appetite 09/29/2019   Assistance needed with transportation 09/29/2019   Medication noncompliance due to cognitive impairment 10/12/2018   Severe episode of recurrent major depressive disorder, without psychotic features ([place]) 10/12/2018   Acute renal failure superimposed on stage 3 chronic kidney disease ([place]) 09/22/2018   Leukocytosis 09/22/2018   Non-intractable vomiting    GERD (gastroesophageal reflux disease) 08/23/2017   CAD (coronary artery disease) 08/23/2017   Hypoglycemia 54/65/0354   Acute metabolic encephalopathy 65/68/1275   Hypokalemia 08/23/2017   Type II diabetes mellitus with renal manifestations ([place]) 08/23/2017   Elevated ferritin 01/03/2017   Personal history of noncompliance with medical treatment, presenting hazards to health    Urinary incontinence, mixed 05/27/2016   Major depression, recurrent ([place]) 05/27/2016   Atherosclerotic heart disease of native coronary artery without angina pectoris 05/27/2016   GAD (generalized anxiety disorder)    Anemia due to chronic kidney disease    Mixed hyperlipidemia due to type 2 diabetes mellitus ([place])    Vitamin D deficiency 11/23/2015   Essential hypertension 11/23/2015  Anemia 11/23/2015   CKD (chronic kidney disease), stage III ([place]) 11/23/2015   Dysrhythmia    COPD (chronic obstructive pulmonary disease) (HCC)     Past Surgical History:  Procedure Laterality Date   ANKLE RECONSTRUCTION     right   CARPAL TUNNEL RELEASE     CORONARY ANGIOPLASTY       OB History   No obstetric  history on file.     Family History  Problem Relation Age of Onset   Diabetes Mother     Social History   Tobacco Use   Smoking status: Former   Smokeless tobacco: Never  Scientific laboratory technician Use: Never used  Substance Use Topics   Alcohol use: No   Drug use: No    Home Medications Prior to Admission medications   Medication Sig Start Date End Date Taking? Authorizing Provider  albuterol (PROVENTIL HFA;VENTOLIN HFA) 108 (90 Base) MCG/ACT inhaler Inhale 2 puffs into the lungs every 6 (six) hours as needed for wheezing or shortness of breath. 04/14/17   Henson, Vickie L, PA-C  Alcohol Swabs (ALCOHOL PREP) PADS Use for testing Patient taking differently: 1 each by Other route 2 (two) times daily. E11.9 08/07/18   Henson, Vickie L, PA-C  amLODipine-benazepril (LOTREL) 5-20 MG capsule Take 1 capsule by mouth every morning. 05/01/20   [provider]  aspirin EC 81 MG tablet Take 81 mg by mouth daily.     [provider]  Biotin 10000 MCG TABS Take 1 tablet by mouth daily.     [provider]  carvedilol (COREG) 6.25 MG tablet TAKE ONE TABLET BY MOUTH EVERY MORNING and TAKE ONE TABLET BY MOUTH EVERYDAY AT BEDTIME 10/23/20   Henson, Vickie L, PA-C  Cholecalciferol (VITAMIN D3) 25 MCG (1000 UT) CAPS Take 1 capsule by mouth daily. 05/01/20   [provider]  Cholecalciferol 25 MCG (1000 UT) tablet Take 1 tablet by mouth daily. 08/11/19   Henson, Vickie L, PA-C  citalopram (CELEXA) 20 MG tablet Take 1 tablet (20 mg total) by mouth daily. 09/05/16   Henson, Vickie L, PA-C  fenofibrate (TRICOR) 145 MG tablet TAKE ONE TABLET BY MOUTH ONCE DAILY 10/23/20   Henson, Vickie L, PA-C  fluticasone (FLONASE) 50 MCG/ACT nasal spray Place 2 sprays into both nostrils daily. 07/25/16   Henson, Vickie L, PA-C  folic acid (FOLVITE) 1 MG tablet TAKE ONE TABLET BY MOUTH EVERY MORNING 10/23/20   Henson, Vickie L, PA-C  glucose blood (ONETOUCH VERIO) test strip 1 each by Other route 2  (two) times daily. And lancets 2/day 10/27/20   Renato Shin, MD  Insulin NPH, Human,, Isophane, (NOVOLIN N FLEXPEN) 100 UNIT/ML Kiwkpen Inject 28 Units into the skin every morning. And pen needles 1/day 10/27/20   Renato Shin, MD  Insulin Pen Needle (PEN NEEDLES) 32G X 5 MM MISC 1 each by Does not apply route daily. 10/27/20   Renato Shin, MD  Lancets [facility] DELICA PLUS [id number]) MISC USE TO test four times A DAY 10/27/20   Renato Shin, MD  liraglutide (VICTOZA) 18 MG/3ML SOPN Inject 0.6 mg into the skin every morning. 10/27/20   Renato Shin, MD  mirtazapine (REMERON) 7.5 MG tablet Take 7.5 mg by mouth at bedtime. 03/02/20   [provider]  omeprazole (PRILOSEC) 40 MG capsule TAKE ONE CAPSULE BY MOUTH EVERY MORNING 10/23/20   Henson, Vickie L, PA-C  rivastigmine (EXELON) 4.6 mg/24hr Place 1 patch (4.6 mg total) onto the skin daily. 11/13/20  Cameron Sprang, MD  simvastatin (ZOCOR) 20 MG tablet TAKE ONE TABLET BY MOUTH EVERYDAY AT BEDTIME 10/23/20   Henson, Vickie L, PA-C  traZODone (DESYREL) 50 MG tablet Take 50 mg by mouth at bedtime.     [provider]    Allergies    Patient has no known allergies.  Review of Systems   Review of Systems  Unable to perform ROS: Mental status change  Gastrointestinal:  Positive for abdominal pain.   Physical Exam Updated Vital Signs BP (!) 152/60   Pulse 68   Temp 98.2 F (36.8 C) (Oral)   Resp 16   SpO2 100%   Physical Exam Vitals and nursing note reviewed.  Constitutional:      General: She is not in acute distress.    Appearance: She is well-developed.     Comments: Mentation appears altered but in no acute discomfort and answer question   HENT:     Head: Atraumatic.  Eyes:     Conjunctiva/sclera: Conjunctivae normal.  Cardiovascular:     Rate and Rhythm: Normal rate and regular rhythm.     Heart sounds: Normal heart sounds.  Pulmonary:     Effort: Pulmonary effort is normal.  Abdominal:     Palpations:  Abdomen is soft.     Tenderness: There is abdominal tenderness (Abdomen is diffusely tender without guarding or rebound tenderness.).  Musculoskeletal:     Cervical back: Neck supple.     Comments: Able to move all 4 extremities.  Skin:    Findings: No rash.  Neurological:     Mental Status: She is alert. She is confused.     GCS: GCS eye subscore is 4. GCS verbal subscore is 5. GCS motor subscore is 6.  Psychiatric:        Mood and Affect: Mood normal.    ED Results / Procedures / Treatments   Labs (all labs ordered are listed, but only abnormal results are displayed) Labs Reviewed  CBC WITH DIFFERENTIAL/PLATELET - Abnormal; Notable for the following components:      Result Value   RBC 3.25 (*)    Hemoglobin 10.1 (*)    HCT 31.3 (*)    All other components within normal limits  COMPREHENSIVE METABOLIC PANEL - Abnormal; Notable for the following components:   Sodium 132 (*)    Glucose, Bld 455 (*)    BUN 46 (*)    Creatinine, Ser 2.21 (*)    Albumin 3.2 (*)    GFR, Estimated 23 (*)    All other components within normal limits  URINALYSIS, ROUTINE W REFLEX MICROSCOPIC - Abnormal; Notable for the following components:   Color, Urine STRAW (*)    APPearance HAZY (*)    Glucose, UA >=500 (*)    Bacteria, UA RARE (*)    All other components within normal limits  CBG MONITORING, ED - Abnormal; Notable for the following components:   Glucose-Capillary 414 (*)    All other components within normal limits  I-STAT VENOUS BLOOD GAS, ED - Abnormal; Notable for the following components:   pO2, Ven 116.0 (*)    HCT 33.0 (*)    Hemoglobin 11.2 (*)    All other components within normal limits  CBG MONITORING, ED - Abnormal; Notable for the following components:   Glucose-Capillary 169 (*)    All other components within normal limits  RESP PANEL BY RT-PCR (FLU A&B, COVID) [id number]  LIPASE, BLOOD  BLOOD GAS, VENOUS    EKG  EKG Interpretation  Date/Time:  Saturday December 30 2020  17:34:24 EST Ventricular Rate:  72 PR Interval:  169 QRS Duration: 134 QT Interval:  419 QTC Calculation: 459 R Axis:   72 Text Interpretation: Sinus rhythm Right bundle branch block no sig change from previous Confirmed by Charlesetta Shanks [phone]) on 12/30/2020 7:45:02 PM  Radiology CT ABDOMEN PELVIS WO CONTRAST  Result Date: 12/30/2020 CLINICAL DATA:  Abdominal pain, acute, nonlocalized. Malaise, unspecified abdominal pain, nausea, dizziness. EXAM: CT ABDOMEN AND PELVIS WITHOUT CONTRAST TECHNIQUE: Multidetector CT imaging of the abdomen and pelvis was performed following the standard protocol without IV contrast. COMPARISON:  09/26/2019 FINDINGS: Lower chest: Stable bronchiectasis and parenchymal scarring within the left lower lobe, likely post inflammatory in nature. No pleural effusion. Moderate coronary artery calcification. Global cardiac size within normal limits. Small hiatal hernia. Hepatobiliary: No focal liver abnormality is seen. No gallstones, gallbladder wall thickening, or biliary dilatation. Pancreas: Unremarkable Spleen: Unremarkable Adrenals/Urinary Tract: The adrenal glands are unremarkable. The kidneys are normal in size and position. Exophytic simple cortical cyst arises from the interpolar region of the right kidney. The kidneys are otherwise un remarkable. Stomach/Bowel: Stomach is within normal limits. Appendix appears normal. No evidence of bowel wall thickening, distention, or inflammatory changes. No free intraperitoneal gas or fluid. Vascular/Lymphatic: Moderate aortoiliac atherosclerotic calcification. No aortic aneurysm. No pathologic adenopathy within the abdomen and pelvis. Reproductive: Status post hysterectomy. No adnexal masses. Other: No abdominal wall hernia.  The rectum is unremarkable. Musculoskeletal: A subacute fracture of the superior endplate of L1 is seen with approximately 25% loss of height. No retropulsion associated. Additional remote appearing fracture of  the superior endplate of D98 also identified with approximately 20% loss of height. No lytic or blastic bone lesion identified. IMPRESSION: Superior endplate fractures of P38 and L1 Emma as described above. L1 fracture appears subacute nature. Correlation for focal tenderness is recommended. If indicated, MRI examination may be helpful to better age these fractures. No acute intra-abdominal pathology identified. Moderate coronary artery calcification. Aortic Atherosclerosis (ICD10-I70.0). Electronically Signed   By: Fidela Salisbury M.D.   On: 12/30/2020 20:40    Procedures Procedures   Medications Ordered in ED Medications  sodium chloride 0.9 % bolus 1,000 mL (0 mLs Intravenous Stopped 12/30/20 2107)  insulin aspart (novoLOG) injection 10 Units (10 Units Subcutaneous Given 12/30/20 1829)  ondansetron (ZOFRAN) injection 4 mg (4 mg Intravenous Given 12/30/20 1829)    ED Course  I have reviewed the triage vital signs and the nursing notes.  Pertinent labs & imaging results that were available during my care of the patient were reviewed by me and considered in my medical decision making (see chart for details).    MDM Rules/Calculators/A&P                           BP (!) 148/95   Pulse 63   Temp 98 F (36.7 C) (Oral)   Resp 14   SpO2 100%   Final Clinical Impression(s) / ED Diagnoses Final diagnoses:  Hyperglycemia    Rx / DC Orders ED Discharge Orders     None      6:24 PM Patient complains of abdominal discomfort and decreased appetite.  She was found to be hyperglycemic with initial CBG of 414.  She does have history of medication noncompliance due to cognitive impairment.  Work-up initiated  7:46 PM Negative viral respiratory panel, as mentioned earlier patient is hyperglycemic with CBG of 414  however in no evidence of anion gap to suggest DKA.  She is currently receiving IV fluid as well as insulin.  Given her age and her abdominal discomfort, will obtain CT scan of  abdomen pelvis for further assessment.  Care discussed with Dr. Vallery Ridge.  10:06 PM CBG significantly improved with IV fluid and with insulin.  Current CBG is 169.  Patient is resting comfortably.  An abdominal and pelvis CT scan demonstrated no acute intra-abdominal pathology however appears that there is a superior endplate fracture of the T11 and L1.  It appears to be subacute in nature.  However, while palpated patient does not have any listed tenderness to her thoracic and lumbar spine on palpation and percussion.  She denies any recent fall or having active back pain.  I discussed the finding with patient.  She will need to follow-up with PCP for recheck  I did reach out to patient's daughter who cared for her.  Daughter mentioned that patient did fall sometime back in August September but no falls recently.  Perhaps injury is related to her previous fall.  She does not have any reproducible pain at this time and daughter mention patient had not complained of any back pain recently.  I suspect her hyperglycemic state is likely due to medication noncompliance patient has history of such.  I encourage daughter to monitor patient's blood sugar carefully and make sure that she is taking her medication appropriately.  Recommend outpatient follow-up as well.  Otherwise she is stable for discharge.   Domenic Moras, PA-C 12/31/20 Roderic [place]    Charlesetta Shanks, MD 01/03/21 1800

## 2020-12-30 NOTE — ED Notes (Signed)
Placed purewick on pt. 

## 2020-12-30 NOTE — ED Triage Notes (Addendum)
Pt from home BIB EMS for c/o malaise, ABD pain, nausea, and dizziness x 1 week. CBG 589. Pt is diabetic and has been compliant with medications per EMS. Pt given 535mL NS IV in route. Pt altered and slow to respond.  20 RAC 168/70 HR 80 98% RA RR 16

## 2020-12-30 NOTE — Discharge Instructions (Signed)
You have been evaluated for your symptoms.  Blood sugar is elevated today but did improve with insulin and with IV fluid.  Please monitor your blood sugar carefully and take your medication as prescribed.  Incidentally it looks like you have a broken bone involving your spine.  This is likely an old break since you do not have any significant pain at this time.  You may follow-up with your doctor for further care.  Return if you have any concern.

## 2021-01-01 ENCOUNTER — Telehealth: Payer: Self-pay

## 2021-01-01 NOTE — Telephone Encounter (Signed)
I called pt. Per pt. Ping report she was recently in the ED for hyperglycemia. I spoke with the pts. Daughter and she said she is doing much better. She stated with in two hours of being at the ED her BS and BS went back down to normal. They did not think she needed a ED f/u at this time.

## 2021-01-05 ENCOUNTER — Telehealth: Payer: Self-pay | Admitting: Internal Medicine

## 2021-01-05 ENCOUNTER — Ambulatory Visit (INDEPENDENT_AMBULATORY_CARE_PROVIDER_SITE_OTHER): Payer: Medicare HMO

## 2021-01-05 VITALS — Ht 64.0 in | Wt 138.0 lb

## 2021-01-05 DIAGNOSIS — Z0000 Encounter for general adult medical examination without abnormal findings: Secondary | ICD-10-CM

## 2021-01-05 NOTE — Patient Instructions (Signed)
Courtney Goodman , Thank you for taking time to come for your Medicare Wellness Visit. I appreciate your ongoing commitment to your health goals. Please review the following plan we discussed and let me know if I can assist you in the future.   Screening recommendations/referrals: Colonoscopy: not required Mammogram: daughter to schedule Bone Density: completed 08/08/2017 Recommended yearly ophthalmology/optometry visit for glaucoma screening and checkup Recommended yearly dental visit for hygiene and checkup  Vaccinations: Influenza vaccine: due Pneumococcal vaccine: completed 05/07/2017 Tdap vaccine: completed 10/12/2019, due 10/11/2029 Shingles vaccine: discussed   Covid-19: 03/08/2020, 06/02/2019, 05/10/2019  Advanced directives: copy in chart  Conditions/risks identified: none  Next appointment: Follow up in one year for your annual wellness visit    Preventive Care 3 Years and Older, Female Preventive care refers to lifestyle choices and visits with your health care provider that can promote health and wellness. What does preventive care include? A yearly physical exam. This is also called an annual well check. Dental exams once or twice a year. Routine eye exams. Ask your health care provider how often you should have your eyes checked. Personal lifestyle choices, including: Daily care of your teeth and gums. Regular physical activity. Eating a healthy diet. Avoiding tobacco and drug use. Limiting alcohol use. Practicing safe sex. Taking low-dose aspirin every day. Taking vitamin and mineral supplements as recommended by your health care provider. What happens during an annual well check? The services and screenings done by your health care provider during your annual well check will depend on your age, overall health, lifestyle risk factors, and family history of disease. Counseling  Your health care provider may ask you questions about your: Alcohol use. Tobacco use. Drug  use. Emotional well-being. Home and relationship well-being. Sexual activity. Eating habits. History of falls. Memory and ability to understand (cognition). Work and work Statistician. Reproductive health. Screening  You may have the following tests or measurements: Height, weight, and BMI. Blood pressure. Lipid and cholesterol levels. These may be checked every 5 years, or more frequently if you are over 80 years old. Skin check. Lung cancer screening. You may have this screening every year starting at age 44 if you have a 30-pack-year history of smoking and currently smoke or have quit within the past 15 years. Fecal occult blood test (FOBT) of the stool. You may have this test every year starting at age 34. Flexible sigmoidoscopy or colonoscopy. You may have a sigmoidoscopy every 5 years or a colonoscopy every 10 years starting at age 55. Hepatitis C blood test. Hepatitis B blood test. Sexually transmitted disease (STD) testing. Diabetes screening. This is done by checking your blood sugar (glucose) after you have not eaten for a while (fasting). You may have this done every 1-3 years. Bone density scan. This is done to screen for osteoporosis. You may have this done starting at age 85. Mammogram. This may be done every 1-2 years. Talk to your health care provider about how often you should have regular mammograms. Talk with your health care provider about your test results, treatment options, and if necessary, the need for more tests. Vaccines  Your health care provider may recommend certain vaccines, such as: Influenza vaccine. This is recommended every year. Tetanus, diphtheria, and acellular pertussis (Tdap, Td) vaccine. You may need a Td booster every 10 years. Zoster vaccine. You may need this after age 74. Pneumococcal 13-valent conjugate (PCV13) vaccine. One dose is recommended after age 28. Pneumococcal polysaccharide (PPSV23) vaccine. One dose is recommended after age  65. Talk to your health care provider about which screenings and vaccines you need and how often you need them. This information is not intended to replace advice given to you by your health care provider. Make sure you discuss any questions you have with your health care provider. Document Released: 02/10/2015 Document Revised: 10/04/2015 Document Reviewed: 11/15/2014 Elsevier Interactive Patient Education  2017 [place] Prevention in the Home Falls can cause injuries. They can happen to people of all ages. There are many things you can do to make your home safe and to help prevent falls. What can I do on the outside of my home? Regularly fix the edges of walkways and driveways and fix any cracks. Remove anything that might make you trip as you walk through a door, such as a raised step or threshold. Trim any bushes or trees on the path to your home. Use bright outdoor lighting. Clear any walking paths of anything that might make someone trip, such as rocks or tools. Regularly check to see if handrails are loose or broken. Make sure that both sides of any steps have handrails. Any raised decks and porches should have guardrails on the edges. Have any leaves, snow, or ice cleared regularly. Use sand or salt on walking paths during winter. Clean up any spills in your garage right away. This includes oil or grease spills. What can I do in the bathroom? Use night lights. Install grab bars by the toilet and in the tub and shower. Do not use towel bars as grab bars. Use non-skid mats or decals in the tub or shower. If you need to sit down in the shower, use a plastic, non-slip stool. Keep the floor dry. Clean up any water that spills on the floor as soon as it happens. Remove soap buildup in the tub or shower regularly. Attach bath mats securely with double-sided non-slip rug tape. Do not have throw rugs and other things on the floor that can make you trip. What can I do in the  bedroom? Use night lights. Make sure that you have a light by your bed that is easy to reach. Do not use any sheets or blankets that are too big for your bed. They should not hang down onto the floor. Have a firm chair that has side arms. You can use this for support while you get dressed. Do not have throw rugs and other things on the floor that can make you trip. What can I do in the kitchen? Clean up any spills right away. Avoid walking on wet floors. Keep items that you use a lot in easy-to-reach places. If you need to reach something above you, use a strong step stool that has a grab bar. Keep electrical cords out of the way. Do not use floor polish or wax that makes floors slippery. If you must use wax, use non-skid floor wax. Do not have throw rugs and other things on the floor that can make you trip. What can I do with my stairs? Do not leave any items on the stairs. Make sure that there are handrails on both sides of the stairs and use them. Fix handrails that are broken or loose. Make sure that handrails are as long as the stairways. Check any carpeting to make sure that it is firmly attached to the stairs. Fix any carpet that is loose or worn. Avoid having throw rugs at the top or bottom of the stairs. If you do have throw  rugs, attach them to the floor with carpet tape. Make sure that you have a light switch at the top of the stairs and the bottom of the stairs. If you do not have them, ask someone to add them for you. What else can I do to help prevent falls? Wear shoes that: Do not have high heels. Have rubber bottoms. Are comfortable and fit you well. Are closed at the toe. Do not wear sandals. If you use a stepladder: Make sure that it is fully opened. Do not climb a closed stepladder. Make sure that both sides of the stepladder are locked into place. Ask someone to hold it for you, if possible. Clearly mark and make sure that you can see: Any grab bars or  handrails. First and last steps. Where the edge of each step is. Use tools that help you move around (mobility aids) if they are needed. These include: Canes. Walkers. Scooters. Crutches. Turn on the lights when you go into a dark area. Replace any light bulbs as soon as they burn out. Set up your furniture so you have a clear path. Avoid moving your furniture around. If any of your floors are uneven, fix them. If there are any pets around you, be aware of where they are. Review your medicines with your doctor. Some medicines can make you feel dizzy. This can increase your chance of falling. Ask your doctor what other things that you can do to help prevent falls. This information is not intended to replace advice given to you by your health care provider. Make sure you discuss any questions you have with your health care provider. Document Released: 11/10/2008 Document Revised: 06/22/2015 Document Reviewed: 02/18/2014 Elsevier Interactive Patient Education  2017 [employer].

## 2021-01-05 NOTE — Telephone Encounter (Signed)
Sch per 11/1 los, pt daughter aware

## 2021-01-05 NOTE — Progress Notes (Signed)
I connected with Courtney Goodman today by telephone and verified that I am speaking with the correct person using two identifiers. Location patient: home Location provider: work Persons participating in the virtual visit: Courtney Goodman, Courtney Goodman (daughter), Glenna Durand LPN.   I discussed the limitations, risks, security and privacy concerns of performing an evaluation and management service by telephone and the availability of in person appointments. I also discussed with the patient that there may be a patient responsible charge related to this service. The patient expressed understanding and verbally consented to this telephonic visit.    Interactive audio and video telecommunications were attempted between this provider and patient, however failed, due to patient having technical difficulties OR patient did not have access to video capability.  We continued and completed visit with audio only.     Vital signs may be patient reported or missing.  Subjective:   Courtney Goodman is a 75 y.o. female who presents for Medicare Annual (Subsequent) preventive examination.  Review of Systems     Cardiac Risk Factors include: advanced age (>76men, >66 women);diabetes mellitus;dyslipidemia;hypertension;sedentary lifestyle     Objective:    Today's Vitals   01/05/21 0857  Weight: 138 lb (62.6 kg)  Height: 5\' 4"  (1.626 m)   Body mass index is 23.69 kg/m.  Advanced Directives 01/05/2021 12/30/2020 11/13/2020 06/01/2020 05/01/2020 11/23/2019 10/05/2019  Does Patient Have a Medical Advance Directive? Yes No Yes Yes;No No No No  Type of Advance Directive Out of facility DNR (pink MOST or yellow form) - - - - - -  Would patient like information on creating a medical advance directive? - No - Guardian declined - No - Patient declined No - Patient declined No - Patient declined No - Patient declined    Current Medications (verified) Outpatient Encounter Medications as of 01/05/2021  Medication Sig    albuterol (PROVENTIL HFA;VENTOLIN HFA) 108 (90 Base) MCG/ACT inhaler Inhale 2 puffs into the lungs every 6 (six) hours as needed for wheezing or shortness of breath.   Alcohol Swabs (ALCOHOL PREP) PADS Use for testing (Patient taking differently: 1 each by Other route 2 (two) times daily. E11.9)   amLODipine-benazepril (LOTREL) 5-20 MG capsule Take 1 capsule by mouth every morning.   aspirin EC 81 MG tablet Take 81 mg by mouth daily.    Biotin 10000 MCG TABS Take 1 tablet by mouth daily.    carvedilol (COREG) 6.25 MG tablet TAKE ONE TABLET BY MOUTH EVERY MORNING and TAKE ONE TABLET BY MOUTH EVERYDAY AT BEDTIME   Cholecalciferol (VITAMIN D3) 25 MCG (1000 UT) CAPS Take 1 capsule by mouth daily.   Cholecalciferol 25 MCG (1000 UT) tablet Take 1 tablet by mouth daily.   citalopram (CELEXA) 20 MG tablet Take 1 tablet (20 mg total) by mouth daily.   fenofibrate (TRICOR) 145 MG tablet TAKE ONE TABLET BY MOUTH ONCE DAILY   fluticasone (FLONASE) 50 MCG/ACT nasal spray Place 2 sprays into both nostrils daily.   folic acid (FOLVITE) 1 MG tablet TAKE ONE TABLET BY MOUTH EVERY MORNING   glucose blood (ONETOUCH VERIO) test strip 1 each by Other route 2 (two) times daily. And lancets 2/day   Insulin NPH, Human,, Isophane, (NOVOLIN N FLEXPEN) 100 UNIT/ML Kiwkpen Inject 28 Units into the skin every morning. And pen needles 1/day   Insulin Pen Needle (PEN NEEDLES) 32G X 5 MM MISC 1 each by Does not apply route daily.   Lancets (ONETOUCH DELICA PLUS FHQRFX58I) MISC USE TO test four times A  DAY   liraglutide (VICTOZA) 18 MG/3ML SOPN Inject 0.6 mg into the skin every morning.   mirtazapine (REMERON) 7.5 MG tablet Take 7.5 mg by mouth at bedtime.   omeprazole (PRILOSEC) 40 MG capsule TAKE ONE CAPSULE BY MOUTH EVERY MORNING   rivastigmine (EXELON) 4.6 mg/24hr Place 1 patch (4.6 mg total) onto the skin daily.   simvastatin (ZOCOR) 20 MG tablet TAKE ONE TABLET BY MOUTH EVERYDAY AT BEDTIME   traZODone (DESYREL) 50 MG  tablet Take 50 mg by mouth at bedtime.    No facility-administered encounter medications on file as of 01/05/2021.    Allergies (verified) Patient has no known allergies.   History: Past Medical History:  Diagnosis Date   Anemia of chronic disease    Asthma    Atherosclerotic heart disease of native coronary artery without angina pectoris    CKD (chronic kidney disease)    COPD (chronic obstructive pulmonary disease) (HCC)    DM (diabetes mellitus), type 2 with renal complications (HCC)    Dysrhythmia    Elevated ferritin 01/03/2017   GAD (generalized anxiety disorder)    Hypertension    Mixed hyperlipidemia due to type 2 diabetes mellitus (HCC)    Mixed incontinence    per medical records from [facility]   Osteopenia    Personal history of noncompliance with medical treatment, presenting hazards to health    Shortness of breath dyspnea    Suicidal ideation    TB (tuberculosis), treated    age 13   Vitamin D deficiency    Past Surgical History:  Procedure Laterality Date   ANKLE RECONSTRUCTION     right   CARPAL TUNNEL RELEASE     CORONARY ANGIOPLASTY     Family History  Problem Relation Age of Onset   Diabetes Mother    Social History   Socioeconomic History   Marital status: Divorced    Spouse name: Not on file   Number of children: 2   Years of education: 12   Highest education level: High school graduate  Occupational History   Occupation: Retired  Tobacco Use   Smoking status: Former   Smokeless tobacco: Never  Scientific laboratory technician Use: Never used  Substance and Sexual Activity   Alcohol use: No   Drug use: No   Sexual activity: Not Currently  Other Topics Concern   Not on file  Social History Narrative   Lives alone    Left handed   Social Determinants of Health   Financial Resource Strain: Low Risk    Difficulty of Paying Living Expenses: Not hard at all  Food Insecurity: No Food Insecurity   Worried About Charity fundraiser in the Last Year:  Never true   Ran Out of Food in the Last Year: Never true  Transportation Needs: No Transportation Needs   Lack of Transportation (Medical): No   Lack of Transportation (Non-Medical): No  Physical Activity: Inactive   Days of Exercise per Week: 0 days   Minutes of Exercise per Session: 0 min  Stress: No Stress Concern Present   Feeling of Stress : Only a little  Social Connections: Not on file    Tobacco Counseling Counseling given: Not Answered   Clinical Intake:  Pre-visit preparation completed: Yes  Pain : No/denies pain     Nutritional Status: BMI of 19-24  Normal Nutritional Risks: None Diabetes: Yes  How often do you need to have someone help you when you read instructions, pamphlets, or other  written materials from your doctor or pharmacy?: 1 - Never  Diabetic? Yes Nutrition Risk Assessment:  Has the patient had any N/V/D within the last 2 months?  No  Does the patient have any non-healing wounds?  No  Has the patient had any unintentional weight loss or weight gain?  No   Diabetes:  Is the patient diabetic?  Yes  If diabetic, was a CBG obtained today?  No  Did the patient bring in their glucometer from home?  No  How often do you monitor your CBG's? daily.   Financial Strains and Diabetes Management:  Are you having any financial strains with the device, your supplies or your medication? No .  Does the patient want to be seen by Chronic Care Management for management of their diabetes?  No  Would the patient like to be referred to a Nutritionist or for Diabetic Management?  No   Diabetic Exams:  Diabetic Eye Exam: Overdue for diabetic eye exam. Pt has been advised about the importance in completing this exam. Patient advised to call and schedule an eye exam. Diabetic Foot Exam: Completed 09/22/2020   Interpreter Needed?: No  Information entered by :: NAllen LPN   Activities of Daily Living In your present state of health, do you have any  difficulty performing the following activities: 01/05/2021 11/29/2020  Hearing? [name]  Comment wears hearing aides no hearing aids  Vision? N N  Difficulty concentrating or making decisions? Y N  Comment when sugar is high -  Walking or climbing stairs? Y N  Dressing or bathing? Y Y  Comment - tiredness  Doing errands, shopping? [name]  Comment - daughter Engineer, building services and eating ? Y N  Using the Toilet? N N  In the past six months, have you accidently leaked urine? N N  Do you have problems with loss of bowel control? N N  Managing your Medications? Y Y  Comment - family helps  Managing your Finances? Y Y  Comment - family helps  Housekeeping or managing your Housekeeping? Y Y  Comment - family helps  Some recent data might be hidden    Patient Care Team: Davy Pique as PCP - General (Family Medicine) Justin Mend, MD as Attending Physician (Internal Medicine) [name], RN as [place] any recent [place] you may have received from other than [facility] providers in the past year (date may be approximate).     Assessment:   This is a routine wellness examination for [facility].  Hearing/Vision screen Vision Screening - Comments:: Regular eye exams, Dr. Katy Fitch  Dietary issues and exercise activities discussed: Current Exercise Habits: The patient does not participate in regular exercise at present   Goals Addressed             This Visit's Progress    Patient Stated       01/05/2021, wants her to gain weight and get strength up       Depression Screen PHQ 2/9 Scores 01/05/2021 11/29/2020 08/31/2020 06/07/2020 06/01/2020 12/30/2019 10/05/2019  PHQ - 2 Score 0 - - 0 - - 0  PHQ- 9 Score - - - 18 - - -  Exception Documentation - Other- indicate reason in comment box Other- indicate reason in comment box - Other- indicate reason in comment box Other- indicate reason in comment box -  Not completed - - daughter dana  does assessment - daughter Courtney Goodman completes assessment daughter  Courtney Goodman completed assessment -    Fall Risk Fall Risk  01/05/2021 11/29/2020 11/13/2020 08/31/2020 06/07/2020  Falls in the past year? 1 1 1 1 1   Comment sugar was high - - - -  Number falls in past yr: 1 1 1 1 1   Injury with Fall? 0 0 0 1 1  Risk Factor Category  - - - - -  Risk for fall due to : History of fall(s);Impaired balance/gait;Impaired mobility;Medication side effect History of fall(s) - History of fall(s) Impaired balance/gait  Follow up Falls evaluation completed;Education provided;Falls prevention discussed Falls prevention discussed - Falls prevention discussed Falls evaluation completed    FALL RISK PREVENTION PERTAINING TO THE HOME:  Any stairs in or around the home? No  If so, are there any without handrails? N/a Home free of loose throw rugs in walkways, pet beds, electrical cords, etc? Yes  Adequate lighting in your home to reduce risk of falls? Yes   ASSISTIVE DEVICES UTILIZED TO PREVENT FALLS:  Life alert? Yes  Use of a cane, walker or w/c? Yes  Grab bars in the bathroom? No  Shower chair or bench in shower? Yes  Elevated toilet seat or a handicapped toilet? No   TIMED UP AND GO:  Was the test performed? No .  .     Cognitive Function: MMSE - Mini Mental State Exam 11/13/2020 09/15/2019  Orientation to time 2 5  Orientation to Place 5 4  Registration 3 3  Attention/ Calculation 0 4  Recall 0 1  Language- name 2 objects 1 2  Language- repeat 1 1  Language- follow 3 step command 2 2  Language- read & follow direction 1 1  Write a sentence 0 1  Copy design 0 0  Total score 15 24        Immunizations Immunization History  Administered Date(s) Administered   Influenza, High Dose Seasonal PF 11/23/2015, 01/03/2017   PFIZER(Purple Top)SARS-COV-2 Vaccination 05/10/2019, 06/02/2019, 03/08/2020   Pneumococcal Conjugate-13 04/18/2016   Pneumococcal Polysaccharide-23 05/07/2017   Tdap 10/12/2019     TDAP status: Up to date  Flu Vaccine status: Due, Education has been provided regarding the importance of this vaccine. Advised may receive this vaccine at local pharmacy or Health Dept. Aware to provide a copy of the vaccination record if obtained from local pharmacy or Health Dept. Verbalized acceptance and understanding.  Pneumococcal vaccine status: Up to date  Covid-19 vaccine status: Completed vaccines  Qualifies for Shingles Vaccine? Yes   Zostavax completed No   Shingrix Completed?: No.    Education has been provided regarding the importance of this vaccine. Patient has been advised to call insurance company to determine out of pocket expense if they have not yet received this vaccine. Advised may also receive vaccine at local pharmacy or Health Dept. Verbalized acceptance and understanding.  Screening Tests Health Maintenance  Topic Date Due   COVID-19 Vaccine (4 - Booster for Pfizer series) 05/03/2020   INFLUENZA VACCINE  08/28/2020   OPHTHALMOLOGY EXAM  01/03/2021   Zoster Vaccines- Shingrix (1 of 2) 04/05/2021 (Originally 02/05/1964)   COLONOSCOPY (Pts 45-18yrs Insurance coverage will need to be confirmed)  01/05/2022 (Originally 03/05/2016)   HEMOGLOBIN A1C  03/15/2021   FOOT EXAM  09/22/2021   TETANUS/TDAP  10/11/2029   Pneumonia Vaccine 74+ Years old  Completed   DEXA SCAN  Completed   Hepatitis C Screening  Completed   HPV VACCINES  Aged Out    Health Maintenance  Health Maintenance Due  Topic Date Due   COVID-19 Vaccine (4 - Booster for Pfizer series) 05/03/2020   INFLUENZA VACCINE  08/28/2020   OPHTHALMOLOGY EXAM  01/03/2021    Colorectal cancer screening: No longer required.   Mammogram status: daughter to schedule  Bone Density status: Completed 08/08/2017.   Lung Cancer Screening: (Low Dose CT Chest recommended if Age 63-80 years, 30 pack-year currently smoking OR have quit w/in 15years.) does not qualify.   Lung Cancer Screening Referral:  no  Additional Screening:  Hepatitis C Screening: does qualify; Completed 04/18/2016  Vision Screening: Recommended annual ophthalmology exams for early detection of glaucoma and other disorders of the eye. Is the patient up to date with their annual eye exam?  No  Who is the provider or what is the name of the office in which the patient attends annual eye exams? Dr. Katy Fitch If pt is not established with a provider, would they like to be referred to a provider to establish care? No .   Dental Screening: Recommended annual dental exams for proper oral hygiene  Community Resource Referral / Chronic Care Management: CRR required this visit?  No   CCM required this visit?  No      Plan:     I have personally reviewed and noted the following in the patient's chart:   Medical and social history Use of alcohol, tobacco or illicit drugs  Current medications and supplements including opioid prescriptions.  Functional ability and status Nutritional status Physical activity Advanced directives List of other physicians Hospitalizations, surgeries, and ER visits in previous 12 months Vitals Screenings to include cognitive, depression, and falls Referrals and appointments  In addition, I have reviewed and discussed with patient certain preventive protocols, quality metrics, and best practice recommendations. A written personalized care plan for preventive services as well as general preventive health recommendations were provided to patient.     Kellie Simmering, LPN   31/05/1759   Nurse Notes: 6 CIT not administered followed by neurology.

## 2021-01-11 ENCOUNTER — Inpatient Hospital Stay: Payer: Medicare HMO

## 2021-01-11 ENCOUNTER — Other Ambulatory Visit: Payer: Self-pay | Admitting: Medical Oncology

## 2021-01-11 ENCOUNTER — Inpatient Hospital Stay: Payer: Medicare HMO | Attending: Oncology

## 2021-01-11 ENCOUNTER — Other Ambulatory Visit: Payer: Self-pay

## 2021-01-11 ENCOUNTER — Telehealth: Payer: Self-pay | Admitting: Medical Oncology

## 2021-01-11 DIAGNOSIS — I129 Hypertensive chronic kidney disease with stage 1 through stage 4 chronic kidney disease, or unspecified chronic kidney disease: Secondary | ICD-10-CM | POA: Insufficient documentation

## 2021-01-11 DIAGNOSIS — D631 Anemia in chronic kidney disease: Secondary | ICD-10-CM

## 2021-01-11 DIAGNOSIS — N183 Chronic kidney disease, stage 3 unspecified: Secondary | ICD-10-CM

## 2021-01-11 LAB — CBC WITH DIFFERENTIAL (CANCER CENTER ONLY)
Abs Immature Granulocytes: 0.02 10*3/uL (ref 0.00–0.07)
Basophils Absolute: 0 10*3/uL (ref 0.0–0.1)
Basophils Relative: 1 %
Eosinophils Absolute: 0.1 10*3/uL (ref 0.0–0.5)
Eosinophils Relative: 1 %
HCT: 32.4 % — ABNORMAL LOW (ref 36.0–46.0)
Hemoglobin: 10.8 g/dL — ABNORMAL LOW (ref 12.0–15.0)
Immature Granulocytes: 0 %
Lymphocytes Relative: 18 %
Lymphs Abs: 1.3 10*3/uL (ref 0.7–4.0)
MCH: 30.5 pg (ref 26.0–34.0)
MCHC: 33.3 g/dL (ref 30.0–36.0)
MCV: 91.5 fL (ref 80.0–100.0)
Monocytes Absolute: 0.5 10*3/uL (ref 0.1–1.0)
Monocytes Relative: 7 %
Neutro Abs: 5.4 10*3/uL (ref 1.7–7.7)
Neutrophils Relative %: 73 %
Platelet Count: 256 10*3/uL (ref 150–400)
RBC: 3.54 MIL/uL — ABNORMAL LOW (ref 3.87–5.11)
RDW: 14.3 % (ref 11.5–15.5)
WBC Count: 7.1 10*3/uL (ref 4.0–10.5)
nRBC: 0 % (ref 0.0–0.2)

## 2021-01-11 MED ORDER — EPOETIN ALFA-EPBX 40000 UNIT/ML IJ SOLN: 40000.0000 [IU] | Freq: Once | INTRAMUSCULAR | Status: DC

## 2021-01-11 NOTE — Progress Notes (Unsigned)
Pt left to take meds and eat after her lab appt, daughter stated she would bring her back up here today to receive injection.

## 2021-01-11 NOTE — Telephone Encounter (Signed)
I have called dtr and pt to come in another day this week for injection . Pt left today before injection. Schedule message sent.

## 2021-01-11 NOTE — Progress Notes (Unsigned)
Abelina Bachelor RN is aware pt will need to be rescheduled for injection due to pt leaving after her lab appt today.

## 2021-01-12 ENCOUNTER — Telehealth: Payer: Self-pay | Admitting: Internal Medicine

## 2021-01-12 ENCOUNTER — Telehealth: Payer: Self-pay | Admitting: Medical Oncology

## 2021-01-12 NOTE — Telephone Encounter (Signed)
Scheduled per sch msg. Called patient and daughters. Was able to leave a msg on yvettes phone. Advised to call back if todays appt does not work

## 2021-01-12 NOTE — Telephone Encounter (Signed)
Inaaya called back and told me to call her daughter for her appts.

## 2021-01-13 ENCOUNTER — Inpatient Hospital Stay: Payer: Medicare HMO

## 2021-01-15 ENCOUNTER — Other Ambulatory Visit: Payer: Self-pay

## 2021-01-15 MED ORDER — FENOFIBRATE 145 MG PO TABS
145.0000 mg | ORAL_TABLET | Freq: Every day | ORAL | 2 refills | Status: DC
Start: 2021-01-15 — End: 2021-04-12

## 2021-01-15 MED ORDER — OMEPRAZOLE 40 MG PO CPDR
40.0000 mg | DELAYED_RELEASE_CAPSULE | Freq: Every morning | ORAL | 2 refills | Status: DC
Start: 2021-01-15 — End: 2021-04-12

## 2021-01-15 MED ORDER — CARVEDILOL 6.25 MG PO TABS: ORAL_TABLET | ORAL | 2 refills | Status: DC

## 2021-01-17 ENCOUNTER — Other Ambulatory Visit: Payer: Self-pay | Admitting: Family Medicine

## 2021-01-17 ENCOUNTER — Other Ambulatory Visit: Payer: Self-pay | Admitting: Medical

## 2021-01-17 DIAGNOSIS — Z1231 Encounter for screening mammogram for malignant neoplasm of breast: Secondary | ICD-10-CM

## 2021-01-20 ENCOUNTER — Emergency Department (HOSPITAL_COMMUNITY)
Admission: EM | Admit: 2021-01-20 | Discharge: 2021-01-20 | Disposition: A | Discharge status: Home / Self Care | Payer: Medicare HMO | Attending: Emergency Medicine | Admitting: Emergency Medicine

## 2021-01-20 ENCOUNTER — Other Ambulatory Visit: Payer: Self-pay

## 2021-01-20 ENCOUNTER — Emergency Department (HOSPITAL_COMMUNITY): Payer: Medicare HMO

## 2021-01-20 DIAGNOSIS — Z79899 Other long term (current) drug therapy: Secondary | ICD-10-CM | POA: Diagnosis not present

## 2021-01-20 DIAGNOSIS — F0390 Unspecified dementia without behavioral disturbance: Secondary | ICD-10-CM | POA: Diagnosis not present

## 2021-01-20 DIAGNOSIS — E1122 Type 2 diabetes mellitus with diabetic chronic kidney disease: Secondary | ICD-10-CM | POA: Diagnosis not present

## 2021-01-20 DIAGNOSIS — I10 Essential (primary) hypertension: Secondary | ICD-10-CM | POA: Diagnosis not present

## 2021-01-20 DIAGNOSIS — R410 Disorientation, unspecified: Secondary | ICD-10-CM | POA: Diagnosis not present

## 2021-01-20 DIAGNOSIS — J449 Chronic obstructive pulmonary disease, unspecified: Secondary | ICD-10-CM | POA: Diagnosis not present

## 2021-01-20 DIAGNOSIS — Z20822 Contact with and (suspected) exposure to covid-19: Secondary | ICD-10-CM | POA: Insufficient documentation

## 2021-01-20 DIAGNOSIS — E161 Other hypoglycemia: Secondary | ICD-10-CM | POA: Diagnosis not present

## 2021-01-20 DIAGNOSIS — I6381 Other cerebral infarction due to occlusion or stenosis of small artery: Secondary | ICD-10-CM

## 2021-01-20 DIAGNOSIS — I69911 Memory deficit following unspecified cerebrovascular disease: Secondary | ICD-10-CM | POA: Insufficient documentation

## 2021-01-20 DIAGNOSIS — Z7952 Long term (current) use of systemic steroids: Secondary | ICD-10-CM | POA: Insufficient documentation

## 2021-01-20 DIAGNOSIS — N184 Chronic kidney disease, stage 4 (severe): Secondary | ICD-10-CM | POA: Diagnosis not present

## 2021-01-20 DIAGNOSIS — R110 Nausea: Secondary | ICD-10-CM | POA: Diagnosis not present

## 2021-01-20 DIAGNOSIS — I2510 Atherosclerotic heart disease of native coronary artery without angina pectoris: Secondary | ICD-10-CM | POA: Insufficient documentation

## 2021-01-20 DIAGNOSIS — Z7982 Long term (current) use of aspirin: Secondary | ICD-10-CM | POA: Diagnosis not present

## 2021-01-20 DIAGNOSIS — Z87891 Personal history of nicotine dependence: Secondary | ICD-10-CM | POA: Diagnosis not present

## 2021-01-20 DIAGNOSIS — Z794 Long term (current) use of insulin: Secondary | ICD-10-CM | POA: Diagnosis not present

## 2021-01-20 DIAGNOSIS — E11649 Type 2 diabetes mellitus with hypoglycemia without coma: Secondary | ICD-10-CM | POA: Insufficient documentation

## 2021-01-20 DIAGNOSIS — J45909 Unspecified asthma, uncomplicated: Secondary | ICD-10-CM | POA: Insufficient documentation

## 2021-01-20 DIAGNOSIS — I129 Hypertensive chronic kidney disease with stage 1 through stage 4 chronic kidney disease, or unspecified chronic kidney disease: Secondary | ICD-10-CM | POA: Diagnosis not present

## 2021-01-20 DIAGNOSIS — R413 Other amnesia: Secondary | ICD-10-CM | POA: Diagnosis not present

## 2021-01-20 DIAGNOSIS — R4182 Altered mental status, unspecified: Secondary | ICD-10-CM | POA: Diagnosis not present

## 2021-01-20 DIAGNOSIS — E162 Hypoglycemia, unspecified: Secondary | ICD-10-CM

## 2021-01-20 LAB — CBC WITH DIFFERENTIAL/PLATELET
Abs Immature Granulocytes: 0.07 10*3/uL (ref 0.00–0.07)
Basophils Absolute: 0.1 10*3/uL (ref 0.0–0.1)
Basophils Relative: 1 %
Eosinophils Absolute: 0.1 10*3/uL (ref 0.0–0.5)
Eosinophils Relative: 1 %
HCT: 35.6 % — ABNORMAL LOW (ref 36.0–46.0)
Hemoglobin: 11.7 g/dL — ABNORMAL LOW (ref 12.0–15.0)
Immature Granulocytes: 1 %
Lymphocytes Relative: 24 %
Lymphs Abs: 2 10*3/uL (ref 0.7–4.0)
MCH: 30.7 pg (ref 26.0–34.0)
MCHC: 32.9 g/dL (ref 30.0–36.0)
MCV: 93.4 fL (ref 80.0–100.0)
Monocytes Absolute: 0.6 10*3/uL (ref 0.1–1.0)
Monocytes Relative: 8 %
Neutro Abs: 5.3 10*3/uL (ref 1.7–7.7)
Neutrophils Relative %: 65 %
Platelets: 286 10*3/uL (ref 150–400)
RBC: 3.81 MIL/uL — ABNORMAL LOW (ref 3.87–5.11)
RDW: 14.6 % (ref 11.5–15.5)
WBC: 8.1 10*3/uL (ref 4.0–10.5)
nRBC: 0 % (ref 0.0–0.2)

## 2021-01-20 LAB — COMPREHENSIVE METABOLIC PANEL
ALT: 12 U/L (ref 0–44)
AST: 28 U/L (ref 15–41)
Albumin: 3.6 g/dL (ref 3.5–5.0)
Alkaline Phosphatase: 44 U/L (ref 38–126)
Anion gap: 9 (ref 5–15)
BUN: 56 mg/dL — ABNORMAL HIGH (ref 8–23)
CO2: 24 mmol/L (ref 22–32)
Calcium: 10 mg/dL (ref 8.9–10.3)
Chloride: 104 mmol/L (ref 98–111)
Creatinine, Ser: 2.53 mg/dL — ABNORMAL HIGH (ref 0.44–1.00)
GFR, Estimated: 19 mL/min — ABNORMAL LOW (ref >60)
Glucose, Bld: 123 mg/dL — ABNORMAL HIGH (ref 70–99)
Potassium: 3.9 mmol/L (ref 3.5–5.1)
Sodium: 137 mmol/L (ref 135–145)
Total Bilirubin: 0.4 mg/dL (ref 0.3–1.2)
Total Protein: 7.3 g/dL (ref 6.5–8.1)

## 2021-01-20 LAB — TSH: TSH: 2.486 u[IU]/mL (ref 0.350–4.500)

## 2021-01-20 LAB — TROPONIN I (HIGH SENSITIVITY)
Troponin I (High Sensitivity): 13 ng/L (ref <18)
Troponin I (High Sensitivity): 9 ng/L (ref <18)

## 2021-01-20 LAB — AMMONIA: Ammonia: 23 umol/L (ref 9–35)

## 2021-01-20 LAB — LIPASE, BLOOD: Lipase: 28 U/L (ref 11–51)

## 2021-01-20 LAB — RESP PANEL BY RT-PCR (FLU A&B, COVID) ARPGX2
Influenza A by PCR: NEGATIVE
Influenza B by PCR: NEGATIVE
SARS Coronavirus 2 by RT PCR: NEGATIVE

## 2021-01-20 LAB — CBG MONITORING, ED: Glucose-Capillary: 122 mg/dL — ABNORMAL HIGH (ref 70–99)

## 2021-01-20 MED ORDER — ONDANSETRON HCL 4 MG/2ML IJ SOLN
4.0000 mg | Freq: Once | INTRAMUSCULAR | Status: AC
  Administered 2021-01-20: 08:00:00 4 mg via INTRAVENOUS
  Filled 2021-01-20: qty 2

## 2021-01-20 NOTE — ED Triage Notes (Signed)
Pt brought in by EMS for hypoglycemia. Pt's initial glucose was 62 per EMS. Pt was given 15 g oral glucose by EMS.

## 2021-01-20 NOTE — ED Notes (Signed)
I went into Courtney Goodman's room to try to straight cath her, but Courtney Goodman wouldn't let me. She pushed me back and told me she wanted to go home and she would not cooperate with me. I notified [place], [place]

## 2021-01-20 NOTE — ED Notes (Signed)
Patient transported to CT 

## 2021-01-20 NOTE — ED Notes (Signed)
Got patient undressed placed a eternal cath and put a brief on patient is resting with call bell in reach

## 2021-01-20 NOTE — Discharge Instructions (Addendum)
Get help right away if: You continue to have hypoglycemia symptoms after eating or drinking something that contains 15 grams of fast-acting carbohydrate, and you cannot get your blood glucose above 70 mg/dL (3.9 mmol/L) while following the 15:15 rule. Your blood glucose is below 54 mg/dL (3 mmol/L). You have a seizure. You faint.  Get help right away if:  You have any symptoms of stroke. "BE FAST" is an easy way to remember the main warning signs of stroke: B - Balance. Signs are dizziness, sudden trouble walking, or loss of balance. E - Eyes. Signs are trouble seeing or a sudden change in vision. F - Face. Signs are sudden weakness or numbness of the face, or the face or eyelid drooping on one side. A - Arms. Signs are weakness or numbness in an arm. This happens suddenly and usually on one side of the body. S - Speech. Signs are sudden trouble speaking, slurred speech, or trouble understanding what people say. T - Time. Time to call emergency services. Write down what time symptoms started. You have other signs of stroke, such as: A sudden, severe headache. Nausea or vomiting. Seizure. You have a severe fall or injury.

## 2021-01-20 NOTE — ED Provider Notes (Signed)
[facility] EMERGENCY DEPARTMENT Provider Note   CSN: [id number] Arrival date & time: 01/20/21  3818     History Chief Complaint  Patient presents with   Hypoglycemia    Courtney Goodman is a 75 y.o. female with a past medical history of memory deficits, CKD, COPD, hyperlipidemia, type 2 diabetes with insulin dependence, poor appetite who presents emergency department with altered mental status and hypoglycemia.  According to EMS report the patient's family member called out this morning due to increased confusion.  Patient was found to have a blood glucose of 62.  She was given 15 g of oral glucose.  She is more alert at this time.  Patient states she just has not been feeling well and that she has not been eating well.  She is unable to provide any other history.   Hypoglycemia     Past Medical History:  Diagnosis Date   Anemia of chronic disease    Asthma    Atherosclerotic heart disease of native coronary artery without angina pectoris    CKD (chronic kidney disease)    COPD (chronic obstructive pulmonary disease) ([place])    DM (diabetes mellitus), type 2 with renal complications ([place])    Dysrhythmia    Elevated ferritin 01/03/2017   GAD (generalized anxiety disorder)    Hypertension    Mixed hyperlipidemia due to type 2 diabetes mellitus ([place])    Mixed incontinence    per medical records from [facility]   Osteopenia    Personal history of noncompliance with medical treatment, presenting hazards to health    Shortness of breath dyspnea    Suicidal ideation    TB (tuberculosis), treated    age 51   Vitamin D deficiency     Patient Active Problem List   Diagnosis Date Noted   Abnormal thyroid exam 09/19/2020   Weight loss 09/12/2020   Syncope 09/12/2020   Uncontrolled type 2 diabetes mellitus with hyperglycemia, with long-term current use of insulin ([place]) 09/12/2020   CKD (chronic kidney disease), stage IV ([place]) 09/12/2020   Hypercalcemia 06/18/2020    Multiple comorbid conditions 06/18/2020   AMS (altered mental status) 06/16/2020   Hyponatremia 06/16/2020   Poor appetite 09/29/2019   Assistance needed with transportation 09/29/2019   Medication noncompliance due to cognitive impairment 10/12/2018   Severe episode of recurrent major depressive disorder, without psychotic features ([place]) 10/12/2018   Acute renal failure superimposed on stage 3 chronic kidney disease ([place]) 09/22/2018   Leukocytosis 09/22/2018   Non-intractable vomiting    GERD (gastroesophageal reflux disease) 08/23/2017   CAD (coronary artery disease) 08/23/2017   Hypoglycemia 29/93/7169   Acute metabolic encephalopathy 67/89/3810   Hypokalemia 08/23/2017   Type II diabetes mellitus with renal manifestations ([place]) 08/23/2017   Elevated ferritin 01/03/2017   Personal history of noncompliance with medical treatment, presenting hazards to health    Urinary incontinence, mixed 05/27/2016   Major depression, recurrent ([place]) 05/27/2016   Atherosclerotic heart disease of native coronary artery without angina pectoris 05/27/2016   GAD (generalized anxiety disorder)    Anemia due to chronic kidney disease    Mixed hyperlipidemia due to type 2 diabetes mellitus ([place])    Vitamin D deficiency 11/23/2015   Essential hypertension 11/23/2015   Anemia 11/23/2015   CKD (chronic kidney disease), stage III ([place]) 11/23/2015   Dysrhythmia    COPD (chronic obstructive pulmonary disease) ([place])     Past Surgical History:  Procedure Laterality Date   ANKLE RECONSTRUCTION  right   CARPAL TUNNEL RELEASE     CORONARY ANGIOPLASTY       OB History   No obstetric history on file.     Family History  Problem Relation Age of Onset   Diabetes Mother     Social History   Tobacco Use   Smoking status: Former   Smokeless tobacco: Never  Scientific laboratory technician Use: Never used  Substance Use Topics   Alcohol use: No   Drug use: No    Home Medications Prior to Admission  medications   Medication Sig Start Date End Date Taking? Authorizing Provider  albuterol (PROVENTIL HFA;VENTOLIN HFA) 108 (90 Base) MCG/ACT inhaler Inhale 2 puffs into the lungs every 6 (six) hours as needed for wheezing or shortness of breath. 04/14/17   Henson, Vickie L, PA-C  Alcohol Swabs (ALCOHOL PREP) PADS Use for testing Patient taking differently: 1 each by Other route 2 (two) times daily. E11.9 08/07/18   Henson, Vickie L, PA-C  amLODipine-benazepril (LOTREL) 5-20 MG capsule Take 1 capsule by mouth every morning. 05/01/20   [provider]  aspirin EC 81 MG tablet Take 81 mg by mouth daily.     [provider]  Biotin 10000 MCG TABS Take 1 tablet by mouth daily.     [provider]  carvedilol (COREG) 6.25 MG tablet TAKE ONE TABLET BY MOUTH EVERY MORNING and TAKE ONE TABLET BY MOUTH EVERYDAY AT BEDTIME 01/15/21   Tysinger, Camelia Eng, PA-C  Cholecalciferol (VITAMIN D3) 25 MCG (1000 UT) CAPS Take 1 capsule by mouth daily. 05/01/20   [provider]  Cholecalciferol 25 MCG (1000 UT) tablet Take 1 tablet by mouth daily. 08/11/19   Henson, Vickie L, PA-C  citalopram (CELEXA) 20 MG tablet Take 1 tablet (20 mg total) by mouth daily. 09/05/16   Henson, Vickie L, PA-C  fenofibrate (TRICOR) 145 MG tablet Take 1 tablet (145 mg total) by mouth daily. 01/15/21   Denita Lung, MD  fluticasone (FLONASE) 50 MCG/ACT nasal spray Place 2 sprays into both nostrils daily. 07/25/16   Henson, Vickie L, PA-C  folic acid (FOLVITE) 1 MG tablet TAKE ONE TABLET BY MOUTH EVERY MORNING 10/23/20   Henson, Vickie L, PA-C  glucose blood (ONETOUCH VERIO) test strip 1 each by Other route 2 (two) times daily. And lancets 2/day 10/27/20   Renato Shin, MD  Insulin NPH, Human,, Isophane, (NOVOLIN N FLEXPEN) 100 UNIT/ML Kiwkpen Inject 28 Units into the skin every morning. And pen needles 1/day 10/27/20   Renato Shin, MD  Insulin Pen Needle (PEN NEEDLES) 32G X 5 MM MISC 1 each by Does not apply route  daily. 10/27/20   Renato Shin, MD  Lancets [facility] DELICA PLUS [id number]) MISC USE TO test four times A DAY 10/27/20   Renato Shin, MD  liraglutide (VICTOZA) 18 MG/3ML SOPN Inject 0.6 mg into the skin every morning. 10/27/20   Renato Shin, MD  mirtazapine (REMERON) 7.5 MG tablet Take 7.5 mg by mouth at bedtime. 03/02/20   [provider]  omeprazole (PRILOSEC) 40 MG capsule Take 1 capsule (40 mg total) by mouth every morning. 01/15/21   Tysinger, Camelia Eng, PA-C  rivastigmine (EXELON) 4.6 mg/24hr Place 1 patch (4.6 mg total) onto the skin daily. 11/13/20   Cameron Sprang, MD  simvastatin (ZOCOR) 20 MG tablet TAKE ONE TABLET BY MOUTH EVERYDAY AT BEDTIME 10/23/20   Henson, Vickie L, PA-C  traZODone (DESYREL) 50 MG tablet Take 50 mg by mouth at  bedtime.     [provider]    Allergies    Patient has no known allergies.  Review of Systems   Review of Systems  Unable to perform ROS: Dementia   Physical Exam Updated Vital Signs BP (!) 164/66    Pulse 60    Resp 18    Ht 5\' 4"  (1.626 m)    Wt 62.6 kg    SpO2 100%    BMI 23.69 kg/m   Physical Exam Vitals and nursing note reviewed.  Constitutional:      General: She is not in acute distress.    Appearance: She is well-developed. She is not diaphoretic.  HENT:     Head: Normocephalic and atraumatic.     Right Ear: External ear normal.     Left Ear: External ear normal.     Nose: Nose normal.     Mouth/Throat:     Mouth: Mucous membranes are moist.  Eyes:     General: No scleral icterus.    Conjunctiva/sclera: Conjunctivae normal.  Cardiovascular:     Rate and Rhythm: Normal rate and regular rhythm.     Heart sounds: Normal heart sounds. No murmur heard.   No friction rub. No gallop.  Pulmonary:     Effort: Pulmonary effort is normal. No respiratory distress.     Breath sounds: Normal breath sounds.  Abdominal:     General: Bowel sounds are normal. There is no distension.     Palpations: Abdomen is soft. There  is no mass.     Tenderness: There is no abdominal tenderness. There is no guarding.  Musculoskeletal:     Cervical back: Normal range of motion.     Right lower leg: No edema.     Left lower leg: No edema.  Skin:    General: Skin is warm and dry.  Neurological:     Mental Status: She is alert and oriented to person, place, and time.  Psychiatric:        Behavior: Behavior normal.    ED Results / Procedures / Treatments   Labs (all labs ordered are listed, but only abnormal results are displayed) Labs Reviewed - No data to display  EKG None  Radiology No results found.  Procedures Procedures   Medications Ordered in ED Medications - No data to display  ED Course  I have reviewed the triage vital signs and the nursing notes.  Pertinent labs & imaging results that were available during my care of the patient were reviewed by me and considered in my medical decision making (see chart for details).  Clinical Course as of 01/20/21 1358  Sat Jan 20, 2021  0941 Spoke with Ms. Yvette Mansour, pt's daughter about findings. She is unsure if her mother has memory problems. Yvette's son lives with patient and provides care. He called 911. [AH]  1022 MAP (mmHg): 95 [AH]    Clinical Course User Index [AH] Margarita Mail, PA-C   MDM Rules/Calculators/A&P                         75 year old female here with hypoglycemia and altered mental status The differential diagnosis for AMS is extensive and includes, but is not limited to: drug overdose - opioids, alcohol, sedatives, antipsychotics, drug withdrawal, others; Metabolic: hypoxia, hypoglycemia, hyperglycemia, hypercalcemia, hypernatremia, hyponatremia, uremia, hepatic encephalopathy, hypothyroidism, hyperthyroidism, vitamin B12 or thiamine deficiency, carbon monoxide poisoning, Wilson's disease, Lactic acidosis, DKA/HHOS; Infectious: meningitis, encephalitis, bacteremia/sepsis, urinary tract infection,  pneumonia, neurosyphilis;  Structural: Space-occupying lesion, (brain tumor, subdural hematoma, hydrocephalus,); Vascular: stroke, subarachnoid hemorrhage, coronary ischemia, hypertensive encephalopathy, CNS vasculitis, thrombotic thrombocytopenic purpura, disseminated intravascular coagulation, hyperviscosity; Psychiatric: Schizophrenia, depression; Other: Seizure, hypothermia, heat stroke, ICU psychosis, dementia -"sundowning."  I ordered and reviewed labs that included CMP which shows elevated creatinine which is the patient's baseline, troponin which is within normal limits, lipase ammonia and respiratory panel all within normal limits, CBC with mild normocytic anemia. I ordered and reviewed images including 1 view chest x-ray and CT head.  Chest x-ray without acute abnormality, CT head shows interval development of a right-sided basilar lacunar infarct new since previous CT head.  Patient unable to provide urine sample but not complaining of urinary symptoms.  Patient initially had a low blood sugar level this was corrected prior to arrival and she has sustained normal blood sugar.  Her daughter is at bedside states this is she is acting like she normally does.  I reviewed EMR and she has had a similar presentation in the past and appears to have some cognitive deficits.  I have discussed all findings with The patient's daughter at bedside that include she has some significant memory issues that need evaluation and the evidence of the stroke.  She has been given an ambulatory referral to outpatient neurology and will need close monitoring and follow-up.  She lives with her grandson who takes care of her.  Discussed outpatient follow-up and return precautions. Final Clinical Impression(s) / ED Diagnoses Final diagnoses:  Hypoglycemia  Memory deficit  Right-sided lacunar infarction [facility])    Rx / DC Orders ED Discharge Orders     None        Margarita Mail, PA-C 01/20/21 1402    Dorie Rank, MD 01/21/21 [phone]

## 2021-01-24 ENCOUNTER — Telehealth: Payer: Self-pay | Admitting: Neurology

## 2021-01-24 NOTE — Telephone Encounter (Signed)
Called and spoke to patients daughter she said patient appears to be very confused and having some bad memory issues. I sent a message up front to have patient scheduled to see Clarise Cruz next available

## 2021-01-24 NOTE — Telephone Encounter (Signed)
Pt's daughter called in stating the patient went to the ED on 01/20/21 and they told her she had had a stroke and needs to see a neurologist. She is scheduled to see Sharene Butters on 05/14/21. Should this appointment be moved up?

## 2021-01-24 NOTE — Telephone Encounter (Signed)
Pls let daughter know I reviewed the scans dating back to 2021, what the ER told her is actually an old stroke seen on prior scans, there is no new stroke. How is she feeling? If she is not back to herself, ok to schedule earlier visit with Clarise Cruz, but otherwise if back to her usual self, 04/2021 f/u is fine. Thanks

## 2021-01-26 ENCOUNTER — Other Ambulatory Visit: Payer: Self-pay

## 2021-01-26 MED ORDER — FOLIC ACID 1 MG PO TABS: 1.0000 mg | ORAL_TABLET | Freq: Every morning | ORAL | 2 refills | Status: DC

## 2021-01-31 ENCOUNTER — Inpatient Hospital Stay: Payer: Medicare HMO | Attending: Oncology

## 2021-01-31 DIAGNOSIS — D631 Anemia in chronic kidney disease: Secondary | ICD-10-CM | POA: Insufficient documentation

## 2021-01-31 DIAGNOSIS — N183 Chronic kidney disease, stage 3 unspecified: Secondary | ICD-10-CM | POA: Insufficient documentation

## 2021-01-31 DIAGNOSIS — I129 Hypertensive chronic kidney disease with stage 1 through stage 4 chronic kidney disease, or unspecified chronic kidney disease: Secondary | ICD-10-CM | POA: Insufficient documentation

## 2021-01-31 DIAGNOSIS — Z79899 Other long term (current) drug therapy: Secondary | ICD-10-CM | POA: Insufficient documentation

## 2021-02-01 ENCOUNTER — Other Ambulatory Visit: Payer: Self-pay

## 2021-02-01 ENCOUNTER — Ambulatory Visit (INDEPENDENT_AMBULATORY_CARE_PROVIDER_SITE_OTHER): Payer: Medicare HMO | Admitting: Physician Assistant

## 2021-02-01 ENCOUNTER — Encounter: Payer: Self-pay | Admitting: Physician Assistant

## 2021-02-01 VITALS — BP 83/70 | HR 89 | Resp 20 | Ht 63.0 in | Wt 135.0 lb

## 2021-02-01 DIAGNOSIS — F0390 Unspecified dementia without behavioral disturbance: Secondary | ICD-10-CM | POA: Diagnosis not present

## 2021-02-01 MED ORDER — MEMANTINE HCL 10 MG PO TABS: ORAL_TABLET | ORAL | 11 refills | Status: AC

## 2021-02-01 NOTE — Patient Instructions (Addendum)
Continue Rivastigmine 4.6mg  every 24 hours  Start Memantine 10mg  tablets.  Take 1 tablet at bedtime for 2 weeks, then 1 tablet twice daily. Call with any questions or concerns.   2. It is very important to control your sugar levels, blood pressure, and cholesterol  3. It is very important to exercise daily  4. Follow-up in 6 months, call for any changes   FALL PRECAUTIONS: Be cautious when walking. Scan the area for obstacles that may increase the risk of trips and falls. When getting up in the mornings, sit up at the edge of the bed for a few minutes before getting out of bed. Consider elevating the bed at the head end to avoid drop of blood pressure when getting up. Walk always in a well-lit room (use night lights in the walls). Avoid area rugs or power cords from appliances in the middle of the walkways. Use a walker or a cane if necessary and consider physical therapy for balance exercise. Get your eyesight checked regularly.  HOME SAFETY: Consider the safety of the kitchen when operating appliances like stoves, microwave oven, and blender. Consider having supervision and share cooking responsibilities until no longer able to participate in those. Accidents with firearms and other hazards in the house should be identified and addressed as well.  ABILITY TO BE LEFT ALONE: If patient is unable to contact 911 operator, consider using LifeLine, or when the need is there, arrange for someone to stay with patients. Smoking is a fire hazard, consider supervision or cessation. Risk of wandering should be assessed by caregiver and if detected at any point, supervision and safe proof recommendations should be instituted.   RECOMMENDATIONS FOR ALL PATIENTS WITH MEMORY PROBLEMS: 1. Continue to exercise (Recommend 30 minutes of walking everyday, or 3 hours every week) 2. Increase social interactions - continue going to [place] and enjoy social gatherings with friends and family 3. Eat healthy, avoid fried  foods and eat more fruits and vegetables 4. Maintain adequate blood pressure, blood sugar, and blood cholesterol level. Reducing the risk of stroke and cardiovascular disease also helps promoting better memory. 5. Avoid stressful situations. Live a simple life and avoid aggravations. Organize your time and prepare for the next day in anticipation. 6. Sleep well, avoid any interruptions of sleep and avoid any distractions in the bedroom that may interfere with adequate sleep quality 7. Avoid sugar, avoid sweets as there is a strong link between excessive sugar intake, diabetes, and cognitive impairment We discussed the Mediterranean diet, which has been shown to help patients reduce the risk of progressive memory disorders and reduces cardiovascular risk. This includes eating fish, eat fruits and green leafy vegetables, nuts like almonds and hazelnuts, walnuts, and also use olive oil. Avoid fast foods and fried foods as much as possible. Avoid sweets and sugar as sugar use has been linked to worsening of memory function.  There is always a concern of gradual progression of memory problems. If this is the case, then we may need to adjust level of care according to patient needs. Support, both to the patient and caregiver, should then be put into place.      Mediterranean Diet  Why follow it? Research shows Those who follow the Mediterranean diet have a reduced risk of heart disease  The diet is associated with a reduced incidence of Parkinson's and Alzheimer's diseases People following the diet may have longer life expectancies and lower rates of chronic diseases  The Dietary Guidelines for Americans recommends the  Mediterranean diet as an eating plan to promote health and prevent disease  What Is the Mediterranean Diet?  Healthy eating plan based on typical foods and recipes of Mediterranean-style cooking The diet is primarily a plant based diet; these foods should make up a majority of meals    Starches - Plant based foods should make up a majority of meals - They are an important sources of vitamins, minerals, energy, antioxidants, and fiber - Choose whole grains, foods high in fiber and minimally processed items  - Typical grain sources include wheat, oats, barley, corn, brown rice, bulgar, farro, millet, polenta, couscous  - Various types of beans include chickpeas, lentils, fava beans, black beans, white beans   Fruits  Veggies - Large quantities of antioxidant rich fruits & veggies; 6 or more servings  - Vegetables can be eaten raw or lightly drizzled with oil and cooked  - Vegetables common to the traditional Mediterranean Diet include: artichokes, arugula, beets, broccoli, brussel sprouts, cabbage, carrots, celery, collard greens, cucumbers, eggplant, kale, leeks, lemons, lettuce, mushrooms, okra, onions, peas, peppers, potatoes, pumpkin, radishes, rutabaga, shallots, spinach, sweet potatoes, turnips, zucchini - Fruits common to the Mediterranean Diet include: apples, apricots, avocados, cherries, clementines, dates, figs, grapefruits, grapes, melons, nectarines, oranges, peaches, pears, pomegranates, strawberries, tangerines  Fats - Replace butter and margarine with healthy oils, such as olive oil, canola oil, and tahini  - Limit nuts to no more than a handful a day  - Nuts include walnuts, almonds, pecans, pistachios, pine nuts  - Limit or avoid candied, honey roasted or heavily salted nuts - Olives are central to the [employer] - can be eaten whole or used in a variety of dishes   Meats Protein - Limiting red meat: no more than a few times a month - When eating red meat: choose lean cuts and keep the portion to the size of deck of cards - Eggs: approx. 0 to 4 times a week  - Fish and lean poultry: at least 2 a week  - Healthy protein sources include, chicken, [place], lean beef, lamb - Increase intake of seafood such as tuna, salmon, trout, mackerel, shrimp,  scallops - Avoid or limit high fat processed meats such as sausage and bacon  Dairy - Include moderate amounts of low fat dairy products  - Focus on healthy dairy such as fat free yogurt, skim milk, low or reduced fat cheese - Limit dairy products higher in fat such as whole or 2% milk, cheese, ice cream  Alcohol - Moderate amounts of red wine is ok  - No more than 5 oz daily for women (all ages) and men older than age 35  - No more than 10 oz of wine daily for men younger than 66  Other - Limit sweets and other desserts  - Use herbs and spices instead of salt to flavor foods  - Herbs and spices common to the traditional Mediterranean Diet include: basil, bay leaves, chives, cloves, cumin, fennel, garlic, lavender, marjoram, mint, oregano, parsley, pepper, rosemary, sage, savory, sumac, tarragon, thyme   Its not just a diet, its a lifestyle:  The Mediterranean diet includes lifestyle factors typical of those in the region  Foods, drinks and meals are best eaten with others and savored Daily physical activity is important for overall good health This could be strenuous exercise like running and aerobics This could also be more leisurely activities such as walking, housework, yard-work, or taking the stairs Moderation is the key; a balanced and  healthy diet accommodates most foods and drinks Consider portion sizes and frequency of consumption of certain foods   Meal Ideas & Options:  Breakfast:  Whole wheat toast or whole wheat English muffins with peanut butter & hard boiled egg Steel cut oats topped with apples & cinnamon and skim milk  Fresh fruit: banana, strawberries, melon, berries, peaches  Smoothies: strawberries, bananas, greek yogurt, peanut butter Low fat greek yogurt with blueberries and granola  Egg white omelet with spinach and mushrooms Breakfast couscous: whole wheat couscous, apricots, skim milk, cranberries  Sandwiches:  Hummus and grilled vegetables (peppers,  zucchini, squash) on whole wheat bread   Grilled chicken on whole wheat pita with lettuce, tomatoes, cucumbers or tzatziki  [place] salad on whole wheat bread: tuna salad made with greek yogurt, olives, red peppers, capers, green onions Garlic rosemary lamb pita: lamb sauted with garlic, rosemary, salt & pepper; add lettuce, cucumber, greek yogurt to pita - flavor with lemon juice and black pepper  Seafood:  Mediterranean grilled salmon, seasoned with garlic, basil, parsley, lemon juice and black pepper Shrimp, lemon, and spinach whole-grain pasta salad made with low fat greek yogurt  Seared scallops with lemon orzo  Seared tuna steaks seasoned salt, pepper, coriander topped with tomato mixture of olives, tomatoes, olive oil, minced garlic, parsley, green onions and cappers  Meats:  Herbed greek chicken salad with kalamata olives, cucumber, feta  Red bell peppers stuffed with spinach, bulgur, lean ground beef (or lentils) & topped with feta   Kebabs: skewers of chicken, tomatoes, onions, zucchini, squash  [place] burgers: made with red onions, mint, dill, lemon juice, feta cheese topped with roasted red peppers Vegetarian Cucumber salad: cucumbers, artichoke hearts, celery, red onion, feta cheese, tossed in olive oil & lemon juice  Hummus and whole grain pita points with a greek salad (lettuce, tomato, feta, olives, cucumbers, red onion) Lentil soup with celery, carrots made with vegetable broth, garlic, salt and pepper  Tabouli salad: parsley, bulgur, mint, scallions, cucumbers, tomato, radishes, lemon juice, olive oil, salt and pepper.

## 2021-02-01 NOTE — Progress Notes (Signed)
Assessment/Plan:   Major neurocognitive disorder likely due to vascular disease without behavioral disturbance  The patient demonstrates worsening dementia.  Most recent MMSE on 02/01/2021 is 8/30.  Currently, she is on rivastigmine 4.6 mg daily.  No signs or symptoms of acute stroke.   Recommendations:   Discussed safety both in and out of the home.  Discussed the importance of regular daily schedule with inclusion of crossword puzzles to maintain brain function.  Continue to monitor mood by PCP Stay active at least 30 minutes at least 3 times a week.  Naps should be scheduled and should be no longer than 60 minutes and should not occur after 2 PM.  Mediterranean diet is recommended  Control cardiovascular risk factors.  Her blood pressure is on the lower side, she will follow-up with PCP regarding blood pressure medication management Continue rivastigmine 4.6 mg daily side effects were discussed Start memantine 10 mg, take 1 p.o. nightly for 2 weeks, then increase to 1 p.o. twice daily.  Side effects were discussed. Follow up in  6 months.   Case discussed with Dr. Delice Goodman who agrees with the plan     Subjective:   I had the pleasure of seeing Courtney Goodman in follow-up in the neurology clinic on 02/03/2020.  She is a very pleasant 76 year old left-handed woman who was last seen at our office on 11/13/2020 at which time her MMSE was 15/30.  She is accompanied by her daughter Courtney Goodman who helps supplement the history today as patient is unable to elaborate due to dementia.  Records and images were personally reviewed where available. She presented for Neuropsychological testing in August 2021 however was somewhat slow to respond and comprehensive testing was unable to be done. The patient is on rivastigmine 4.6 mg daily, tolerating well.  On 01/24/2021, the patient's daughter called the office, after her mother was evaluated at the ER, "was told that she had a stroke and needs to be  followed by neurology ".  Although CT of the head was negative for acute stroke, she is here prior to her scheduled appointment for memory on 05/04/2021 for further evaluation.  Daughter denies that patient is experiencing vertigo dizziness or vision changes. headaches, dysarthria or dysphagia, chest pain, or shortness of breath. Denies any fever or chills, or night sweats, new meds or hormonal supplements. Does take a regular ASA a day, with no other antiplatelets or anticoagulants No recent long distance trips or recent surgeries. No sick contacts. No unilateral numbness or weakness.no syncope or presyncope.  She is at "her usual health".   Regarding her memory, her daughter reports that it is worse over the last 2 months, being less active than before.  She used to have a PT coming every 2 weeks, but she refused to exercise even with the aid, does not has to "push her to do ".  Her daughter is in charge of most of the patient's activities, cooking, finances, medications and driving.  She has an aide coming 20 half hours a day to help her with meals and for bathing and dressing.  She is beginning to hoard stuff in her bed, and prior to coming to the office, she tried to put a diaper on her hair instead of the week.  She ambulates without difficulty, then uses a cane most of the time, occasionally uses a walker when in public.  No recent falls or head injuries.  She no longer can count her fingers.  Courtney Goodman denies any sleep concerns.  There is no known paranoia, hallucinations or wandering behaviors.  She is urine incontinent, wears diapers.  Denies constipation or diarrhea.     History on Initial Assessment 08/03/2019: This is a 76 year old right-handed woman with a history of hypertension, hyperlipidemia, diabetes, presenting for evaluation of cognitive impairment. She comes to the office this morning stating she woke up very confused, she is wearing mis-matched shoes, wearing a shoe on the right foot and a sandal  on the left foot. She states she has not been feeling well this past week, her sugars have been acting up and insulin dose was adjusted a week ago. She was given ginger ale on arrival since she was unsteady, glucose reading after ginger ale was 81, which she states is still low for her. She states she is very confused and cannot remember things when her glucose levels are low. She feels good in the afternoons when her glucose levels are in the 150s. She states she does not eat enough when she goes to sleep at night, so her glucose levels drop and she wakes up hypoglycemic and confused. One time she was very confused, glucose level was in the 30s, she was brought to the ER and level was up by then. She states she has lost her appetite, she does not forget to eat but feels sick so she does not eat. She does not think she forgets her medications, she denies any missed bill payments. I spoke to her daughter Courtney Goodman on the phone to obtain additional information. Courtney Goodman reports that memory changes only started 6-7 months ago when her insulin dose was changed. Courtney Goodman was not noticing any memory concerns prior to this, she notes when the insulin dose was increased or decreased, her mother would get discombobulated, some mornings she wakes up not knowing what is going on and her glucose level would be either low or high. She lives alone, Courtney Goodman agrees that she is compliant with her medications and knows how she should take her insulin if her level is low. Her other daughter comes daily to check on her, Courtney Goodman comes once a week and calls daily. Courtney Goodman states she is fine with cooking, when her glucose level is up/down, she may forget and left the stove on one time. She reports going to the grocery and having to ask her daughter for her PIN number because she could not remember. Courtney Goodman states she has not been driving for 1.5 years, Courtney Goodman denies getting lost, however the patient reports that she stopped driving because she would have spells  where she ends up somewhere and would not remember her she got there, usually when her glucose levels were off. Courtney Goodman reports bathing has gotten a little sketchy because she if afraid of falling in the shower. I discussed the patients state of dress today (wearing mismatched shoes), Courtney Goodman states this is unusual, she normally dresses fine but has a hard time with early morning appointments because she has not yet eaten so her glucose levels are low.She has a bedside commode and wears adult pads for urinary incontinence. Courtney Goodman feels she needs assistance with showering/dressing, bu the patient has been turning down assistance.    She reports frontal headaches 1-2 times a week, she thinks this is stress-related, she cannot remember things. She denies any diplopia, dysarthria/dysphagia, neck/back pain, focal numbness/tingling, bowel dysfunction, anosmia, or tremors. Sleep is okay with Trazodone. She feels off balance, she has a walker/cane at home but does not use it. She reports falling  out of bed this week, she was asleep and turned and fell off, waking up on the floor. Courtney Goodman denies any personality changes, "she is a Retail buyer," but no hallucinations or paranoia. No family history of dementia, no history of significant head injuries or alcohol use.    Her last brain imaging on [facility] was in 2012, with no acute changes seen. There was note of a small punctate linear shaped area of low-attenuation that projects within the medial of caudate nucleus on the right, a second more vague area projects just lateral to this, likely representing sequela or chronic and subacute areas of lacunar infarct. There was a small punctate area of calcification along the periphery of the left occipital region, likely a small calcified meningioma   Laboratory Data:      Lab Results  Component Value Date    TSH 2.90 09/12/2020         Lab Results  Component Value Date    VITAMINB12 959 (H) 09/13/2020         Lab Results   Component Value Date    HGBA1C 10.6 (A) 09/12/2020       PREVIOUS MEDICATIONS:   CURRENT MEDICATIONS:  Outpatient Encounter Medications as of 02/01/2021  Medication Sig   albuterol (PROVENTIL HFA;VENTOLIN HFA) 108 (90 Base) MCG/ACT inhaler Inhale 2 puffs into the lungs every 6 (six) hours as needed for wheezing or shortness of breath.   Alcohol Swabs (ALCOHOL PREP) PADS Use for testing (Patient taking differently: 1 each by Other route 2 (two) times daily. E11.9)   amLODipine-benazepril (LOTREL) 5-20 MG capsule Take 1 capsule by mouth every morning.   aspirin EC 81 MG tablet Take 81 mg by mouth daily.    Biotin 10000 MCG TABS Take 1 tablet by mouth daily.    carvedilol (COREG) 6.25 MG tablet TAKE ONE TABLET BY MOUTH EVERY MORNING and TAKE ONE TABLET BY MOUTH EVERYDAY AT BEDTIME   Cholecalciferol (VITAMIN D3) 25 MCG (1000 UT) CAPS Take 1 capsule by mouth daily.   Cholecalciferol 25 MCG (1000 UT) tablet Take 1 tablet by mouth daily.   citalopram (CELEXA) 20 MG tablet Take 1 tablet (20 mg total) by mouth daily.   fenofibrate (TRICOR) 145 MG tablet Take 1 tablet (145 mg total) by mouth daily.   fluticasone (FLONASE) 50 MCG/ACT nasal spray Place 2 sprays into both nostrils daily.   folic acid (FOLVITE) 1 MG tablet Take 1 tablet (1 mg total) by mouth every morning.   glucose blood (ONETOUCH VERIO) test strip 1 each by Other route 2 (two) times daily. And lancets 2/day   Insulin NPH, Human,, Isophane, (NOVOLIN N FLEXPEN) 100 UNIT/ML Kiwkpen Inject 28 Units into the skin every morning. And pen needles 1/day   Insulin Pen Needle (PEN NEEDLES) 32G X 5 MM MISC 1 each by Does not apply route daily.   Lancets (ONETOUCH DELICA PLUS [id number]) MISC USE TO test four times A DAY   liraglutide (VICTOZA) 18 MG/3ML SOPN Inject 0.6 mg into the skin every morning.   memantine (NAMENDA) 10 MG tablet Take 1 tablet (10 mg at night) for 2 weeks, then increase to 1 tablet (10 mg) twice a day   mirtazapine (REMERON)  7.5 MG tablet Take 7.5 mg by mouth at bedtime.   omeprazole (PRILOSEC) 40 MG capsule Take 1 capsule (40 mg total) by mouth every morning.   rivastigmine (EXELON) 4.6 mg/24hr Place 1 patch (4.6 mg total) onto the skin daily.   simvastatin (ZOCOR) 20  MG tablet TAKE ONE TABLET BY MOUTH EVERYDAY AT BEDTIME   traZODone (DESYREL) 50 MG tablet Take 50 mg by mouth at bedtime.    No facility-administered encounter medications on file as of 02/01/2021.     Objective:     PHYSICAL EXAMINATION:    VITALS:   Vitals:   02/01/21 1059  BP: (!) 83/70  Pulse: 89  Resp: 20  SpO2: 97%  Weight: 135 lb (61.2 kg)  Height: 5\' 3"  (1.6 m)    GEN:  The patient appears stated age and is in NAD. HEENT:  Normocephalic, atraumatic.   Neurological examination:  General: NAD, well-groomed, appears stated age. Orientation: The patient is alert. Oriented to person, not to place or date.  Unable to count fingers.  Delayed recall 0/3.  Unable to draw, unable to copy design.  Unable to do immediate repetition.  Attention reduced.  Cranial nerves: There is good facial symmetry.The speech is not fluent but reduced clear. No aphasia or dysarthria. Fund of knowledge is appropriate. Recent and remote memory are impaired. Concentration are reduced.  Able to name objects but unable to repeat phrases.  Hearing is intact to conversational tone.    Sensation: Sensation is intact to light touch throughout Motor: Strength is at least antigravity x4. Tremors: none  DTR's 1/4 in UE/LE    No flowsheet data found. MMSE - Mini Mental State Exam 02/01/2021 11/13/2020 09/15/2019  Orientation to time 0 2 5  Orientation to Place 4 5 4   Registration 0 3 3  Attention/ Calculation 0 0 4  Recall 0 0 1  Language- name 2 objects 2 1 2   Language- repeat 0 1 1  Language- follow 3 step command 2 2 2   Language- read & follow direction 0 1 1  Write a sentence 0 0 1  Copy design 0 0 0  Total score 8 15 24     [employer] Mental  Exam 08/03/2019  Weekday Correct 1  Current year 0  What state are we in? 1  Amount spent 0  Amount left 0  # of Animals 0  5 objects recall 3  Number series 0  Hour markers 1  Time correct 0  Placed X in triangle correctly 1  Largest Figure 1  Name of female 2  Date back to work 0  Type of work 2  State she lived in 0  Total score 12       Movement examination: Tone: There is normal tone in the UE/LE Abnormal movements:  no tremor.  No myoclonus.  No asterixis.   Coordination:  There is no decremation with RAM's. Normal finger to nose  Gait and Station: The patient has no difficulty arising out of a deep-seated chair without the use of the hands. The patient's stride length is good.  Uses a left cane.  Gait is cautious and narrow.     Total time spent on today's visit was 30  minutes, including both face-to-face time and nonface-to-face time. Time included that spent on review of records (prior notes available to me/labs/imaging if pertinent), discussing treatment and goals, answering patient's questions and coordinating care.  Cc:  [name], PA-C Sharene Butters, PA-C

## 2021-02-02 DIAGNOSIS — E161 Other hypoglycemia: Secondary | ICD-10-CM | POA: Diagnosis not present

## 2021-02-03 ENCOUNTER — Other Ambulatory Visit: Payer: Self-pay

## 2021-02-03 ENCOUNTER — Observation Stay (HOSPITAL_COMMUNITY)
Admission: EM | Admit: 2021-02-03 | Discharge: 2021-02-05 | Disposition: A | Discharge status: Home / Self Care | Payer: Medicare HMO | Attending: Internal Medicine | Admitting: Internal Medicine

## 2021-02-03 ENCOUNTER — Encounter (HOSPITAL_COMMUNITY): Payer: Self-pay

## 2021-02-03 ENCOUNTER — Emergency Department (HOSPITAL_COMMUNITY): Payer: Medicare HMO

## 2021-02-03 DIAGNOSIS — E1122 Type 2 diabetes mellitus with diabetic chronic kidney disease: Secondary | ICD-10-CM | POA: Insufficient documentation

## 2021-02-03 DIAGNOSIS — R4182 Altered mental status, unspecified: Secondary | ICD-10-CM | POA: Insufficient documentation

## 2021-02-03 DIAGNOSIS — I6381 Other cerebral infarction due to occlusion or stenosis of small artery: Secondary | ICD-10-CM | POA: Diagnosis not present

## 2021-02-03 DIAGNOSIS — Z20822 Contact with and (suspected) exposure to covid-19: Secondary | ICD-10-CM | POA: Insufficient documentation

## 2021-02-03 DIAGNOSIS — R1111 Vomiting without nausea: Secondary | ICD-10-CM | POA: Diagnosis not present

## 2021-02-03 DIAGNOSIS — E875 Hyperkalemia: Secondary | ICD-10-CM | POA: Diagnosis not present

## 2021-02-03 DIAGNOSIS — R2689 Other abnormalities of gait and mobility: Secondary | ICD-10-CM | POA: Diagnosis not present

## 2021-02-03 DIAGNOSIS — E1129 Type 2 diabetes mellitus with other diabetic kidney complication: Secondary | ICD-10-CM | POA: Diagnosis present

## 2021-02-03 DIAGNOSIS — N179 Acute kidney failure, unspecified: Secondary | ICD-10-CM | POA: Diagnosis not present

## 2021-02-03 DIAGNOSIS — R55 Syncope and collapse: Secondary | ICD-10-CM | POA: Diagnosis not present

## 2021-02-03 DIAGNOSIS — N184 Chronic kidney disease, stage 4 (severe): Secondary | ICD-10-CM | POA: Insufficient documentation

## 2021-02-03 DIAGNOSIS — Z87891 Personal history of nicotine dependence: Secondary | ICD-10-CM | POA: Insufficient documentation

## 2021-02-03 DIAGNOSIS — R109 Unspecified abdominal pain: Secondary | ICD-10-CM | POA: Diagnosis not present

## 2021-02-03 DIAGNOSIS — R110 Nausea: Secondary | ICD-10-CM | POA: Diagnosis not present

## 2021-02-03 DIAGNOSIS — S0990XA Unspecified injury of head, initial encounter: Principal | ICD-10-CM | POA: Insufficient documentation

## 2021-02-03 DIAGNOSIS — W19XXXA Unspecified fall, initial encounter: Secondary | ICD-10-CM

## 2021-02-03 DIAGNOSIS — M47812 Spondylosis without myelopathy or radiculopathy, cervical region: Secondary | ICD-10-CM | POA: Diagnosis not present

## 2021-02-03 DIAGNOSIS — I2510 Atherosclerotic heart disease of native coronary artery without angina pectoris: Secondary | ICD-10-CM | POA: Diagnosis not present

## 2021-02-03 DIAGNOSIS — Z955 Presence of coronary angioplasty implant and graft: Secondary | ICD-10-CM | POA: Insufficient documentation

## 2021-02-03 DIAGNOSIS — Z7982 Long term (current) use of aspirin: Secondary | ICD-10-CM | POA: Diagnosis not present

## 2021-02-03 DIAGNOSIS — F0390 Unspecified dementia without behavioral disturbance: Secondary | ICD-10-CM | POA: Insufficient documentation

## 2021-02-03 DIAGNOSIS — Z794 Long term (current) use of insulin: Secondary | ICD-10-CM | POA: Diagnosis not present

## 2021-02-03 DIAGNOSIS — S0003XA Contusion of scalp, initial encounter: Secondary | ICD-10-CM | POA: Diagnosis not present

## 2021-02-03 DIAGNOSIS — Z79899 Other long term (current) drug therapy: Secondary | ICD-10-CM | POA: Insufficient documentation

## 2021-02-03 DIAGNOSIS — J449 Chronic obstructive pulmonary disease, unspecified: Secondary | ICD-10-CM | POA: Diagnosis present

## 2021-02-03 DIAGNOSIS — I10 Essential (primary) hypertension: Secondary | ICD-10-CM | POA: Diagnosis not present

## 2021-02-03 DIAGNOSIS — J9811 Atelectasis: Secondary | ICD-10-CM | POA: Diagnosis not present

## 2021-02-03 DIAGNOSIS — Y92009 Unspecified place in unspecified non-institutional (private) residence as the place of occurrence of the external cause: Secondary | ICD-10-CM | POA: Diagnosis not present

## 2021-02-03 DIAGNOSIS — W0110XA Fall on same level from slipping, tripping and stumbling with subsequent striking against unspecified object, initial encounter: Secondary | ICD-10-CM | POA: Diagnosis not present

## 2021-02-03 DIAGNOSIS — R1013 Epigastric pain: Secondary | ICD-10-CM | POA: Insufficient documentation

## 2021-02-03 DIAGNOSIS — E1165 Type 2 diabetes mellitus with hyperglycemia: Secondary | ICD-10-CM

## 2021-02-03 DIAGNOSIS — J45909 Unspecified asthma, uncomplicated: Secondary | ICD-10-CM | POA: Insufficient documentation

## 2021-02-03 DIAGNOSIS — I129 Hypertensive chronic kidney disease with stage 1 through stage 4 chronic kidney disease, or unspecified chronic kidney disease: Secondary | ICD-10-CM | POA: Diagnosis not present

## 2021-02-03 DIAGNOSIS — R42 Dizziness and giddiness: Secondary | ICD-10-CM

## 2021-02-03 DIAGNOSIS — R531 Weakness: Secondary | ICD-10-CM | POA: Diagnosis not present

## 2021-02-03 DIAGNOSIS — Z043 Encounter for examination and observation following other accident: Secondary | ICD-10-CM | POA: Diagnosis not present

## 2021-02-03 DIAGNOSIS — I700 Atherosclerosis of aorta: Secondary | ICD-10-CM | POA: Diagnosis not present

## 2021-02-03 DIAGNOSIS — N183 Chronic kidney disease, stage 3 unspecified: Secondary | ICD-10-CM | POA: Diagnosis present

## 2021-02-03 LAB — URINALYSIS, ROUTINE W REFLEX MICROSCOPIC
Bilirubin Urine: NEGATIVE
Glucose, UA: NEGATIVE mg/dL
Hgb urine dipstick: NEGATIVE
Ketones, ur: NEGATIVE mg/dL
Leukocytes,Ua: NEGATIVE
Nitrite: NEGATIVE
Protein, ur: NEGATIVE mg/dL
Specific Gravity, Urine: 1.015 (ref 1.005–1.030)
pH: 6.5 (ref 5.0–8.0)

## 2021-02-03 LAB — CBC
HCT: 39.3 % (ref 36.0–46.0)
Hemoglobin: 12.4 g/dL (ref 12.0–15.0)
MCH: 30.9 pg (ref 26.0–34.0)
MCHC: 31.6 g/dL (ref 30.0–36.0)
MCV: 98 fL (ref 80.0–100.0)
Platelets: 261 10*3/uL (ref 150–400)
RBC: 4.01 MIL/uL (ref 3.87–5.11)
RDW: 15.2 % (ref 11.5–15.5)
WBC: 10.7 10*3/uL — ABNORMAL HIGH (ref 4.0–10.5)
nRBC: 0 % (ref 0.0–0.2)

## 2021-02-03 LAB — HEMOGLOBIN A1C
Hgb A1c MFr Bld: 9.3 % — ABNORMAL HIGH (ref 4.8–5.6)
Mean Plasma Glucose: 220.21 mg/dL

## 2021-02-03 LAB — RESP PANEL BY RT-PCR (FLU A&B, COVID) ARPGX2
Influenza A by PCR: NEGATIVE
Influenza B by PCR: NEGATIVE
SARS Coronavirus 2 by RT PCR: NEGATIVE

## 2021-02-03 LAB — COMPREHENSIVE METABOLIC PANEL
ALT: 17 U/L (ref 0–44)
AST: 38 U/L (ref 15–41)
Albumin: 3.7 g/dL (ref 3.5–5.0)
Alkaline Phosphatase: 53 U/L (ref 38–126)
Anion gap: 11 (ref 5–15)
BUN: 55 mg/dL — ABNORMAL HIGH (ref 8–23)
CO2: 21 mmol/L — ABNORMAL LOW (ref 22–32)
Calcium: 10.2 mg/dL (ref 8.9–10.3)
Chloride: 105 mmol/L (ref 98–111)
Creatinine, Ser: 2.38 mg/dL — ABNORMAL HIGH (ref 0.44–1.00)
GFR, Estimated: 21 mL/min — ABNORMAL LOW (ref >60)
Glucose, Bld: 96 mg/dL (ref 70–99)
Potassium: 5.3 mmol/L — ABNORMAL HIGH (ref 3.5–5.1)
Sodium: 137 mmol/L (ref 135–145)
Total Bilirubin: 0.7 mg/dL (ref 0.3–1.2)
Total Protein: 7.7 g/dL (ref 6.5–8.1)

## 2021-02-03 LAB — LIPASE, BLOOD: Lipase: 27 U/L (ref 11–51)

## 2021-02-03 LAB — CBG MONITORING, ED: Glucose-Capillary: 195 mg/dL — ABNORMAL HIGH (ref 70–99)

## 2021-02-03 LAB — POTASSIUM: Potassium: 4.8 mmol/L (ref 3.5–5.1)

## 2021-02-03 MED ORDER — CARVEDILOL 6.25 MG PO TABS
6.2500 mg | ORAL_TABLET | Freq: Two times a day (BID) | ORAL | Status: DC
  Administered 2021-02-04 (×2): 6.25 mg via ORAL
  Filled 2021-02-03: qty 1
  Filled 2021-02-03: qty 2

## 2021-02-03 MED ORDER — LACTATED RINGERS IV BOLUS
500.0000 mL | Freq: Once | INTRAVENOUS | Status: AC
  Administered 2021-02-03: 500 mL via INTRAVENOUS

## 2021-02-03 MED ORDER — ACETAMINOPHEN 650 MG RE SUPP: 650.0000 mg | Freq: Four times a day (QID) | RECTAL | Status: DC | PRN

## 2021-02-03 MED ORDER — SIMVASTATIN 20 MG PO TABS
20.0000 mg | ORAL_TABLET | Freq: Every day | ORAL | Status: DC
  Administered 2021-02-03 – 2021-02-04 (×2): 20 mg via ORAL
  Filled 2021-02-03 (×2): qty 1

## 2021-02-03 MED ORDER — ACETAMINOPHEN 325 MG PO TABS
650.0000 mg | ORAL_TABLET | Freq: Four times a day (QID) | ORAL | Status: DC | PRN
  Administered 2021-02-04: 650 mg via ORAL
  Filled 2021-02-03: qty 2

## 2021-02-03 MED ORDER — PANTOPRAZOLE SODIUM 40 MG PO TBEC
40.0000 mg | DELAYED_RELEASE_TABLET | Freq: Every day | ORAL | Status: DC
  Administered 2021-02-04 – 2021-02-05 (×2): 40 mg via ORAL
  Filled 2021-02-03 (×2): qty 1

## 2021-02-03 MED ORDER — RIVASTIGMINE 4.6 MG/24HR TD PT24
4.6000 mg | MEDICATED_PATCH | Freq: Every day | TRANSDERMAL | Status: DC
  Administered 2021-02-04 – 2021-02-05 (×2): 4.6 mg via TRANSDERMAL
  Filled 2021-02-03 (×2): qty 1

## 2021-02-03 MED ORDER — HYDRALAZINE HCL 20 MG/ML IJ SOLN
10.0000 mg | INTRAMUSCULAR | Status: DC | PRN
  Administered 2021-02-04: 10 mg via INTRAVENOUS
  Filled 2021-02-03: qty 1

## 2021-02-03 MED ORDER — FENOFIBRATE 160 MG PO TABS
160.0000 mg | ORAL_TABLET | Freq: Every day | ORAL | Status: DC
  Administered 2021-02-04 – 2021-02-05 (×2): 160 mg via ORAL
  Filled 2021-02-03 (×2): qty 1

## 2021-02-03 MED ORDER — INSULIN ASPART 100 UNIT/ML IJ SOLN: 0.0000 [IU] | Freq: Three times a day (TID) | INTRAMUSCULAR | Status: DC

## 2021-02-03 MED ORDER — FERROUS SULFATE 325 (65 FE) MG PO TABS
325.0000 mg | ORAL_TABLET | Freq: Every day | ORAL | Status: DC
  Administered 2021-02-04 – 2021-02-05 (×2): 325 mg via ORAL
  Filled 2021-02-03 (×2): qty 1

## 2021-02-03 MED ORDER — FOLIC ACID 1 MG PO TABS
1.0000 mg | ORAL_TABLET | Freq: Every morning | ORAL | Status: DC
  Administered 2021-02-04 – 2021-02-05 (×2): 1 mg via ORAL
  Filled 2021-02-03 (×2): qty 1

## 2021-02-03 NOTE — ED Notes (Signed)
Patient is resting comfortably. 

## 2021-02-03 NOTE — H&P (Signed)
History and Physical    Courtney Goodman DTO:[id number] DOB: [birth year] DOA: 02/03/2021  PCP: [name], PA-C  Patient coming from: Home.  Chief Complaint: Fall.  History obtained from patient's daughter as patient has dementia.  HPI: Courtney Goodman is a 76 y.o. female with dementia, hypertension, diabetes mellitus type 2, chronic kidney disease stage IV, anemia was brought to the ER after patient had a hypoglycemic episode with dizziness nausea vomiting.  As per the patient's daughter patient had a fall while patient's home health aide was at home this morning.  As per the daughter patient exact circumstances which led to the fall was not clear but did not lose consciousness.  After the fall she did hit her head and had a scalp hematoma.  Later patient started having nausea vomiting and feeling dizzy.  Patient blood sugar was around 80s which is low for the patient as per the patient's daughter.  EMS was called and patient was brought to the ER.  ED Course: In the ER patient had CT head and C-spine and CT abdomen pelvis which show scalp hematoma otherwise unremarkable.  EKG shows normal sinus rhythm.  Patient was afebrile COVID test negative labs show mild hypokalemia.  Patient admitted for further observation.  Review of Systems: As per HPI, rest all negative.   Past Medical History:  Diagnosis Date   Anemia of chronic disease    Asthma    Atherosclerotic heart disease of native coronary artery without angina pectoris    CKD (chronic kidney disease)    COPD (chronic obstructive pulmonary disease) (HCC)    DM (diabetes mellitus), type 2 with renal complications (HCC)    Dysrhythmia    Elevated ferritin 01/03/2017   GAD (generalized anxiety disorder)    Hypertension    Mixed hyperlipidemia due to type 2 diabetes mellitus (HCC)    Mixed incontinence    per medical records from [facility]   Osteopenia    Personal history of noncompliance with medical treatment, presenting hazards to  health    Shortness of breath dyspnea    Suicidal ideation    TB (tuberculosis), treated    age 76   Vitamin D deficiency     Past Surgical History:  Procedure Laterality Date   ANKLE RECONSTRUCTION     right   CARPAL TUNNEL RELEASE     CORONARY ANGIOPLASTY       reports that she has quit smoking. She has never used smokeless tobacco. She reports that she does not drink alcohol and does not use drugs.  No Known Allergies  Family History  Problem Relation Age of Onset   Diabetes Mother     Prior to Admission medications   Medication Sig Start Date End Date Taking? Authorizing Provider  albuterol (PROVENTIL HFA;VENTOLIN HFA) 108 (90 Base) MCG/ACT inhaler Inhale 2 puffs into the lungs every 6 (six) hours as needed for wheezing or shortness of breath. 04/14/17   Henson, Vickie L, PA-C  Alcohol Swabs (ALCOHOL PREP) PADS Use for testing Patient taking differently: 1 each by Other route 2 (two) times daily. E11.9 08/07/18   Henson, Vickie L, PA-C  amLODipine-benazepril (LOTREL) 5-20 MG capsule Take 1 capsule by mouth every morning. 05/01/20   [provider]  aspirin EC 81 MG tablet Take 81 mg by mouth daily.     [provider]  Biotin 10000 MCG TABS Take 1 tablet by mouth daily.     [provider]  carvedilol (COREG) 6.25 MG  tablet TAKE ONE TABLET BY MOUTH EVERY MORNING and TAKE ONE TABLET BY MOUTH EVERYDAY AT BEDTIME 01/15/21   Tysinger, Camelia Eng, PA-C  Cholecalciferol (VITAMIN D3) 25 MCG (1000 UT) CAPS Take 1 capsule by mouth daily. 05/01/20   [provider]  Cholecalciferol 25 MCG (1000 UT) tablet Take 1 tablet by mouth daily. 08/11/19   Henson, Vickie L, PA-C  citalopram (CELEXA) 20 MG tablet Take 1 tablet (20 mg total) by mouth daily. 09/05/16   Henson, Vickie L, PA-C  fenofibrate (TRICOR) 145 MG tablet Take 1 tablet (145 mg total) by mouth daily. 01/15/21   Denita Lung, MD  fluticasone (FLONASE) 50 MCG/ACT nasal spray Place 2 sprays into both  nostrils daily. 07/25/16   Henson, Vickie L, PA-C  folic acid (FOLVITE) 1 MG tablet Take 1 tablet (1 mg total) by mouth every morning. 01/26/21   Tysinger, Camelia Eng, PA-C  glucose blood (ONETOUCH VERIO) test strip 1 each by Other route 2 (two) times daily. And lancets 2/day 10/27/20   Renato Shin, MD  Insulin NPH, Human,, Isophane, (NOVOLIN N FLEXPEN) 100 UNIT/ML Kiwkpen Inject 28 Units into the skin every morning. And pen needles 1/day 10/27/20   Renato Shin, MD  Insulin Pen Needle (PEN NEEDLES) 32G X 5 MM MISC 1 each by Does not apply route daily. 10/27/20   Renato Shin, MD  Lancets [facility] DELICA PLUS [id number]) MISC USE TO test four times A DAY 10/27/20   Renato Shin, MD  liraglutide (VICTOZA) 18 MG/3ML SOPN Inject 0.6 mg into the skin every morning. 10/27/20   Renato Shin, MD  memantine (NAMENDA) 10 MG tablet Take 1 tablet (10 mg at night) for 2 weeks, then increase to 1 tablet (10 mg) twice a day 02/01/21   [name], PA-C  mirtazapine (REMERON) 7.5 MG tablet Take 7.5 mg by mouth at bedtime. 03/02/20   [provider]  omeprazole (PRILOSEC) 40 MG capsule Take 1 capsule (40 mg total) by mouth every morning. 01/15/21   Tysinger, Camelia Eng, PA-C  rivastigmine (EXELON) 4.6 mg/24hr Place 1 patch (4.6 mg total) onto the skin daily. 11/13/20   Cameron Sprang, MD  simvastatin (ZOCOR) 20 MG tablet TAKE ONE TABLET BY MOUTH EVERYDAY AT BEDTIME 10/23/20   Henson, Vickie L, PA-C  traZODone (DESYREL) 50 MG tablet Take 50 mg by mouth at bedtime.     [provider]    Physical Exam: Constitutional: Moderately built and nourished. Vitals:   02/03/21 1617 02/03/21 1645 02/03/21 1715 02/03/21 1745  BP:  (!) 164/79 (!) 168/84 (!) 150/79  Pulse:  70 66 66  Resp:  18 14 12   Temp: 97.9 F (36.6 C)     TempSrc: Rectal     SpO2:  100% 100% 100%   Eyes: Anicteric no pallor. ENMT: No discharge from the ears eyes nose and mouth. Neck: No mass felt.  No neck rigidity. Respiratory: No  rhonchi or crepitations. Cardiovascular: S1-S2 heard. Abdomen: Soft nontender bowel sound present. Musculoskeletal: No edema. Skin: No rash. Neurologic: Alert awake oriented to her name and place moving all extremities. Psychiatric: Appears normal.  Has dementia.   Labs on Admission: I have personally reviewed following labs and imaging studies  CBC: Recent Labs  Lab 02/03/21 1628  WBC 10.7*  HGB 12.4  HCT 39.3  MCV 98.0  PLT 097   Basic Metabolic Panel: Recent Labs  Lab 02/03/21 1628 02/03/21 1806  NA 137  --   K 5.3* 4.8  CL  105  --   CO2 21*  --   GLUCOSE 96  --   BUN 55*  --   CREATININE 2.38*  --   CALCIUM 10.2  --    GFR: Estimated Creatinine Clearance: 16.9 mL/min (A) (by C-G formula based on SCr of 2.38 mg/dL (H)). Liver Function Tests: Recent Labs  Lab 02/03/21 1628  AST 38  ALT 17  ALKPHOS 53  BILITOT 0.7  PROT 7.7  ALBUMIN 3.7   Recent Labs  Lab 02/03/21 1628  LIPASE 27   No results for input(s): AMMONIA in the last 168 hours. Coagulation Profile: No results for input(s): INR, PROTIME in the last 168 hours. Cardiac Enzymes: No results for input(s): CKTOTAL, CKMB, CKMBINDEX, TROPONINI in the last 168 hours. BNP (last 3 results) No results for input(s): PROBNP in the last 8760 hours. HbA1C: No results for input(s): HGBA1C in the last 72 hours. CBG: No results for input(s): GLUCAP in the last 168 hours. Lipid Profile: No results for input(s): CHOL, HDL, LDLCALC, TRIG, CHOLHDL, LDLDIRECT in the last 72 hours. Thyroid Function Tests: No results for input(s): TSH, T4TOTAL, FREET4, T3FREE, THYROIDAB in the last 72 hours. Anemia Panel: No results for input(s): VITAMINB12, FOLATE, FERRITIN, TIBC, IRON, RETICCTPCT in the last 72 hours. Urine analysis:    Component Value Date/Time   COLORURINE YELLOW 02/03/2021 1628   APPEARANCEUR CLEAR 02/03/2021 1628   LABSPEC 1.015 02/03/2021 1628   LABSPEC 1.020 10/12/2018 1055   PHURINE 6.5 02/03/2021  1628   GLUCOSEU NEGATIVE 02/03/2021 1628   HGBUR NEGATIVE 02/03/2021 1628   BILIRUBINUR NEGATIVE 02/03/2021 1628   BILIRUBINUR negative 10/12/2018 1055   BILIRUBINUR n 07/01/2016 1139   KETONESUR NEGATIVE 02/03/2021 1628   PROTEINUR NEGATIVE 02/03/2021 1628   UROBILINOGEN negative (A) 07/01/2016 1139   NITRITE NEGATIVE 02/03/2021 1628   LEUKOCYTESUR NEGATIVE 02/03/2021 1628   Sepsis Labs: @LABRCNTIP (procalcitonin:4,lacticidven:4) ) Recent Results (from the past 240 hour(s))  Resp Panel by RT-PCR (Flu A&B, Covid) Urine, Catheterized     Status: None   Collection Time: 02/03/21  4:29 PM   Specimen: Urine, Catheterized; Nasopharyngeal(NP) swabs in vial transport medium  Result Value Ref Range Status   SARS Coronavirus 2 by RT PCR NEGATIVE NEGATIVE Final    Comment: (NOTE) SARS-CoV-2 target nucleic acids are NOT DETECTED.  The SARS-CoV-2 RNA is generally detectable in upper respiratory specimens during the acute phase of infection. The lowest concentration of SARS-CoV-2 viral copies this assay can detect is 138 copies/mL. A negative result does not preclude SARS-Cov-2 infection and should not be used as the sole basis for treatment or other patient management decisions. A negative result may occur with  improper specimen collection/handling, submission of specimen other than nasopharyngeal swab, presence of viral mutation(s) within the areas targeted by this assay, and inadequate number of viral copies(<138 copies/mL). A negative result must be combined with clinical observations, patient history, and epidemiological information. The expected result is Negative.  Fact Sheet for Patients:  EntrepreneurPulse.com.au  Fact Sheet for Healthcare Providers:  IncredibleEmployment.be  This test is no t yet approved or cleared by the [place] FDA and  has been authorized for detection and/or diagnosis of SARS-CoV-2 by FDA under an Emergency Use  Authorization (EUA). This EUA will remain  in effect (meaning this test can be used) for the duration of the COVID-19 declaration under Section 564(b)(1) of the Act, 21 U.S.C.section 360bbb-3(b)(1), unless the authorization is terminated  or revoked sooner.       Influenza A  by PCR NEGATIVE NEGATIVE Final   Influenza B by PCR NEGATIVE NEGATIVE Final    Comment: (NOTE) The Xpert Xpress SARS-CoV-2/FLU/RSV plus assay is intended as an aid in the diagnosis of influenza from Nasopharyngeal swab specimens and should not be used as a sole basis for treatment. Nasal washings and aspirates are unacceptable for Xpert Xpress SARS-CoV-2/FLU/RSV testing.  Fact Sheet for Patients: EntrepreneurPulse.com.au  Fact Sheet for Healthcare Providers: IncredibleEmployment.be  This test is not yet approved or cleared by the [place] FDA and has been authorized for detection and/or diagnosis of SARS-CoV-2 by FDA under an Emergency Use Authorization (EUA). This EUA will remain in effect (meaning this test can be used) for the duration of the COVID-19 declaration under Section 564(b)(1) of the Act, 21 U.S.C. section 360bbb-3(b)(1), unless the authorization is terminated or revoked.  Performed at [facility], [place] [address], [place], [place] [address]      Radiological Exams on Admission: CT ABDOMEN PELVIS WO CONTRAST  Result Date: 02/03/2021 CLINICAL DATA:  Abdomen pain with nausea and vomiting EXAM: CT ABDOMEN AND PELVIS WITHOUT CONTRAST TECHNIQUE: Multidetector CT imaging of the abdomen and pelvis was performed following the standard protocol without IV contrast. COMPARISON:  CT 12/30/2020, 09/26/2019, FINDINGS: Lower chest: Lung bases demonstrate bronchiectasis in the left lower lobe with chronic calcifications. No acute airspace disease or pleural effusion. Hepatobiliary: No focal liver abnormality is seen. No gallstones, gallbladder wall thickening, or  biliary dilatation. Pancreas: Unremarkable. No pancreatic ductal dilatation or surrounding inflammatory changes. Spleen: Normal in size without focal abnormality. Adrenals/Urinary Tract: Adrenal glands are normal. Kidneys show no hydronephrosis. Small cyst exophytic to the lower pole right kidney. The bladder is normal. Stomach/Bowel: Stomach nonenlarged. No dilated small bowel. No acute bowel wall thickening. Negative appendix. Vascular/Lymphatic: Advanced aortic atherosclerosis. No aneurysm. No suspicious lymph node Reproductive: Status post hysterectomy. No adnexal masses. Other: Negative for pelvic effusion or free air. Musculoskeletal: Chronic compression fracture superior endplate T11. Subacute to chronic compression fracture L1 involving the superior endplate. IMPRESSION: 1. No CT evidence for acute intra-abdominal or pelvic abnormality. 2. Other chronic findings as described above. Electronically Signed   By: Donavan Foil M.D.   On: 02/03/2021 19:02   CT Head Wo Contrast  Result Date: 02/03/2021 CLINICAL DATA:  Fall. EXAM: CT HEAD WITHOUT CONTRAST CT CERVICAL SPINE WITHOUT CONTRAST TECHNIQUE: Multidetector CT imaging of the head and cervical spine was performed following the standard protocol without intravenous contrast. Multiplanar CT image reconstructions of the cervical spine were also generated. COMPARISON:  CT head dated January 20, 2021. CT cervical spine dated September 12, 2020. FINDINGS: CT HEAD FINDINGS Brain: No evidence of acute infarction, hemorrhage, hydrocephalus, extra-axial collection or mass lesion/mass effect. Stable atrophy and chronic microvascular ischemic changes. Old right caudate and left basal ganglia lacunar infarcts again noted. Vascular: Calcified atherosclerosis at the skull base. No hyperdense vessel. Skull: Normal. Negative for fracture or focal lesion. Sinuses/Orbits: No acute finding. Other: Small left parieto-occipital scalp hematoma. CT CERVICAL SPINE FINDINGS  Alignment: Reversal of the normal cervical lordosis. No traumatic malalignment. Skull base and vertebrae: No acute fracture. No primary bone lesion or focal pathologic process. Soft tissues and spinal canal: No prevertebral fluid or swelling. No visible canal hematoma. Disc levels: Unchanged severe disc height loss and moderate uncovertebral hypertrophy at C3-C4 and C5-C6. Unchanged mild to moderate disc height loss at C2-C3. Upper chest: Mild biapical pleuroparenchymal scarring. Other: None. IMPRESSION: 1. No acute intracranial abnormality. Small left parieto-occipital scalp hematoma. 2.  No acute cervical spine fracture or traumatic malalignment. Electronically Signed   By: Titus Dubin M.D.   On: 02/03/2021 19:00   CT Cervical Spine Wo Contrast  Result Date: 02/03/2021 CLINICAL DATA:  Fall. EXAM: CT HEAD WITHOUT CONTRAST CT CERVICAL SPINE WITHOUT CONTRAST TECHNIQUE: Multidetector CT imaging of the head and cervical spine was performed following the standard protocol without intravenous contrast. Multiplanar CT image reconstructions of the cervical spine were also generated. COMPARISON:  CT head dated January 20, 2021. CT cervical spine dated September 12, 2020. FINDINGS: CT HEAD FINDINGS Brain: No evidence of acute infarction, hemorrhage, hydrocephalus, extra-axial collection or mass lesion/mass effect. Stable atrophy and chronic microvascular ischemic changes. Old right caudate and left basal ganglia lacunar infarcts again noted. Vascular: Calcified atherosclerosis at the skull base. No hyperdense vessel. Skull: Normal. Negative for fracture or focal lesion. Sinuses/Orbits: No acute finding. Other: Small left parieto-occipital scalp hematoma. CT CERVICAL SPINE FINDINGS Alignment: Reversal of the normal cervical lordosis. No traumatic malalignment. Skull base and vertebrae: No acute fracture. No primary bone lesion or focal pathologic process. Soft tissues and spinal canal: No prevertebral fluid or swelling.  No visible canal hematoma. Disc levels: Unchanged severe disc height loss and moderate uncovertebral hypertrophy at C3-C4 and C5-C6. Unchanged mild to moderate disc height loss at C2-C3. Upper chest: Mild biapical pleuroparenchymal scarring. Other: None. IMPRESSION: 1. No acute intracranial abnormality. Small left parieto-occipital scalp hematoma. 2. No acute cervical spine fracture or traumatic malalignment. Electronically Signed   By: Titus Dubin M.D.   On: 02/03/2021 19:00   DG Chest Port 1 View  Result Date: 02/03/2021 CLINICAL DATA:  Epigastric pain.  Patient also reports fall. EXAM: PORTABLE CHEST 1 VIEW COMPARISON:  01/20/2021 FINDINGS: The cardiomediastinal contours are normal. Streaky subsegmental opacities at the left lung base. Pulmonary vasculature is normal. No confluent consolidation, pleural effusion, or pneumothorax. No acute osseous abnormalities are seen. IMPRESSION: Streaky left lung base atelectasis. Electronically Signed   By: Keith Rake M.D.   On: 02/03/2021 17:11    EKG: Independently reviewed.  Normal sinus rhythm.  Assessment/Plan Principal Problem:   Dizziness Active Problems:   Essential hypertension   CKD (chronic kidney disease), stage III (HCC)   COPD (chronic obstructive pulmonary disease) (HCC)   CAD (coronary artery disease)   Type II diabetes mellitus with renal manifestations (HCC)    Dizziness after a fall and hitting her head could be possibly postconcussive.  We will continue to monitor get physical therapy consult. Nausea vomiting also could be postconcussive since patient's abdominal CT does not show any acute LFTs are normal.  Abdominal exam appears benign.  Will advance diet as tolerated. Hypoglycemia could be from poor oral intake and nausea vomiting.  Check hemoglobin A1c for now hold patient's home dose of insulin closely follow CBGs.  Restart once blood sugar stable. Hypertension takes Coreg. Chronic kidney disease stage IV creatinine  appears to be at baseline. History of anemia secondary to chronic kidney disease stage IV.  Follow CBC. Dementia on Exelon patch.  Lenox Ponds has been recently ordered by her primary care physician.   DVT prophylaxis: SCDs.  Has scalp hematoma avoiding anticoagulation. Code Status: Full code. Family Communication: Discussed with patient. Disposition Plan: Home. Consults called: Physical therapy. Admission status: Observation.   Rise Patience MD [facility] Pager [phone].  If 7PM-7AM, please contact night-coverage [email or web address] Password [facility]  02/03/2021, 9:38 PM

## 2021-02-03 NOTE — ED Provider Notes (Signed)
[facility] EMERGENCY DEPARTMENT Provider Note   CSN: [id number] Arrival date & time: 02/03/21  1532     History  Chief Complaint  Patient presents with   Drema Halon Kingston is a 76 y.o. female.  HPI 5 caveat secondary to patient's poor memory of events History obtained from patient and from EMS This is a 76 year old woman who was transported from home with reports that she fell and has had nausea and vomiting.  She states that she was sitting on the side of the bed when she fell today.  She is unable to tell me if this was a mechanical fall or she passed out.  She struck the back of her head.  She is complaining of pain in the back of her head.  She denies any other injuries.  She reports nausea and vomiting and upper abdominal discomfort.  She denies runny nose, cough, chest pain, dyspnea, fever, chills, UTI symptoms or diarrhea.     Home Medications Prior to Admission medications   Medication Sig Start Date End Date Taking? Authorizing Provider  albuterol (PROVENTIL HFA;VENTOLIN HFA) 108 (90 Base) MCG/ACT inhaler Inhale 2 puffs into the lungs every 6 (six) hours as needed for wheezing or shortness of breath. 04/14/17   Henson, Vickie L, PA-C  Alcohol Swabs (ALCOHOL PREP) PADS Use for testing Patient taking differently: 1 each by Other route 2 (two) times daily. E11.9 08/07/18   Henson, Vickie L, PA-C  amLODipine-benazepril (LOTREL) 5-20 MG capsule Take 1 capsule by mouth every morning. 05/01/20   [provider]  aspirin EC 81 MG tablet Take 81 mg by mouth daily.     [provider]  Biotin 10000 MCG TABS Take 1 tablet by mouth daily.     [provider]  carvedilol (COREG) 6.25 MG tablet TAKE ONE TABLET BY MOUTH EVERY MORNING and TAKE ONE TABLET BY MOUTH EVERYDAY AT BEDTIME 01/15/21   Tysinger, Camelia Eng, PA-C  Cholecalciferol (VITAMIN D3) 25 MCG (1000 UT) CAPS Take 1 capsule by mouth daily. 05/01/20   [provider]   Cholecalciferol 25 MCG (1000 UT) tablet Take 1 tablet by mouth daily. 08/11/19   Henson, Vickie L, PA-C  citalopram (CELEXA) 20 MG tablet Take 1 tablet (20 mg total) by mouth daily. 09/05/16   Henson, Vickie L, PA-C  fenofibrate (TRICOR) 145 MG tablet Take 1 tablet (145 mg total) by mouth daily. 01/15/21   Denita Lung, MD  fluticasone (FLONASE) 50 MCG/ACT nasal spray Place 2 sprays into both nostrils daily. 07/25/16   Henson, Vickie L, PA-C  folic acid (FOLVITE) 1 MG tablet Take 1 tablet (1 mg total) by mouth every morning. 01/26/21   Tysinger, Camelia Eng, PA-C  glucose blood (ONETOUCH VERIO) test strip 1 each by Other route 2 (two) times daily. And lancets 2/day 10/27/20   Renato Shin, MD  Insulin NPH, Human,, Isophane, (NOVOLIN N FLEXPEN) 100 UNIT/ML Kiwkpen Inject 28 Units into the skin every morning. And pen needles 1/day 10/27/20   Renato Shin, MD  Insulin Pen Needle (PEN NEEDLES) 32G X 5 MM MISC 1 each by Does not apply route daily. 10/27/20   Renato Shin, MD  Lancets [facility] DELICA PLUS [id number]) MISC USE TO test four times A DAY 10/27/20   Renato Shin, MD  liraglutide (VICTOZA) 18 MG/3ML SOPN Inject 0.6 mg into the skin every morning. 10/27/20   Renato Shin, MD  memantine (NAMENDA) 10 MG tablet Take 1 tablet (10 mg  at night) for 2 weeks, then increase to 1 tablet (10 mg) twice a day 02/01/21   [name], PA-C  mirtazapine (REMERON) 7.5 MG tablet Take 7.5 mg by mouth at bedtime. 03/02/20   [provider]  omeprazole (PRILOSEC) 40 MG capsule Take 1 capsule (40 mg total) by mouth every morning. 01/15/21   Tysinger, Camelia Eng, PA-C  rivastigmine (EXELON) 4.6 mg/24hr Place 1 patch (4.6 mg total) onto the skin daily. 11/13/20   Cameron Sprang, MD  simvastatin (ZOCOR) 20 MG tablet TAKE ONE TABLET BY MOUTH EVERYDAY AT BEDTIME 10/23/20   Henson, Vickie L, PA-C  traZODone (DESYREL) 50 MG tablet Take 50 mg by mouth at bedtime.     [provider]      Allergies    Patient  has no known allergies.    Review of Systems   Review of Systems  All other systems reviewed and are negative.  Physical Exam Updated Vital Signs BP (!) 150/79    Pulse 66    Temp 97.9 F (36.6 C) (Rectal)    Resp 12    SpO2 100%  Physical Exam Vitals and nursing note reviewed.  Constitutional:      Appearance: Normal appearance. She is obese.  HENT:     Head: Normocephalic.     Jaw: There is normal jaw occlusion.     Comments: Tender swollen area occiput    Right Ear: External ear normal.     Left Ear: External ear normal.     Nose: Nose normal.     Mouth/Throat:     Lips: Pink.     Mouth: Mucous membranes are dry.     Pharynx: Oropharynx is clear. Uvula midline.  Eyes:     General: Lids are normal.     Extraocular Movements: Extraocular movements intact.     Conjunctiva/sclera: Conjunctivae normal.  Neck:     Vascular: No carotid bruit.     Trachea: Trachea normal.     Comments: No obvious trauma to the neck no point tenderness on palpation collar is being placed in ED Cardiovascular:     Rate and Rhythm: Normal rate and regular rhythm. FrequentExtrasystoles are present.    Pulses: Normal pulses.  Pulmonary:     Effort: Pulmonary effort is normal.     Breath sounds: Normal breath sounds.  Abdominal:     General: Abdomen is flat. Bowel sounds are normal.     Palpations: Abdomen is soft.     Tenderness: There is abdominal tenderness in the epigastric area.  Musculoskeletal:        General: No swelling or tenderness. Normal range of motion.     Right lower leg: No edema.     Left lower leg: No edema.  Skin:    General: Skin is warm.     Capillary Refill: Capillary refill takes less than 2 seconds.  Neurological:     General: No focal deficit present.     Mental Status: She is alert.     Comments: Patient with no facial asymmetry Full equal active range of motion all extremities with no weakness noted Patient is somewhat confused as to the date but knows which  hospital she is in and thinks it is her birthday which is tomorrow. She is unable to give me any specifics around the events that occurred today  Psychiatric:        Mood and Affect: Mood normal.    ED Results / Procedures / Treatments  Labs (all labs ordered are listed, but only abnormal results are displayed) Labs Reviewed  CBC - Abnormal; Notable for the following components:      Result Value   WBC 10.7 (*)    All other components within normal limits  COMPREHENSIVE METABOLIC PANEL - Abnormal; Notable for the following components:   Potassium 5.3 (*)    CO2 21 (*)    BUN 55 (*)    Creatinine, Ser 2.38 (*)    GFR, Estimated 21 (*)    All other components within normal limits  RESP PANEL BY RT-PCR (FLU A&B, COVID) [id number]  URINALYSIS, ROUTINE W REFLEX MICROSCOPIC  LIPASE, BLOOD  POTASSIUM    EKG EKG Interpretation  Date/Time:  Saturday February 03 2021 15:54:18 EST Ventricular Rate:  67 PR Interval:  163 QRS Duration: 134 QT Interval:  428 QTC Calculation: 452 R Axis:   82 Text Interpretation: Sinus rhythm Right bundle branch block No significant change since last tracing Confirmed by Pattricia Boss [phone]) on 02/03/2021 4:00:19 PM  Radiology CT ABDOMEN PELVIS WO CONTRAST  Result Date: 02/03/2021 CLINICAL DATA:  Abdomen pain with nausea and vomiting EXAM: CT ABDOMEN AND PELVIS WITHOUT CONTRAST TECHNIQUE: Multidetector CT imaging of the abdomen and pelvis was performed following the standard protocol without IV contrast. COMPARISON:  CT 12/30/2020, 09/26/2019, FINDINGS: Lower chest: Lung bases demonstrate bronchiectasis in the left lower lobe with chronic calcifications. No acute airspace disease or pleural effusion. Hepatobiliary: No focal liver abnormality is seen. No gallstones, gallbladder wall thickening, or biliary dilatation. Pancreas: Unremarkable. No pancreatic ductal dilatation or surrounding inflammatory changes. Spleen: Normal in size without focal abnormality.  Adrenals/Urinary Tract: Adrenal glands are normal. Kidneys show no hydronephrosis. Small cyst exophytic to the lower pole right kidney. The bladder is normal. Stomach/Bowel: Stomach nonenlarged. No dilated small bowel. No acute bowel wall thickening. Negative appendix. Vascular/Lymphatic: Advanced aortic atherosclerosis. No aneurysm. No suspicious lymph node Reproductive: Status post hysterectomy. No adnexal masses. Other: Negative for pelvic effusion or free air. Musculoskeletal: Chronic compression fracture superior endplate T11. Subacute to chronic compression fracture L1 involving the superior endplate. IMPRESSION: 1. No CT evidence for acute intra-abdominal or pelvic abnormality. 2. Other chronic findings as described above. Electronically Signed   By: Donavan Foil M.D.   On: 02/03/2021 19:02   CT Head Wo Contrast  Result Date: 02/03/2021 CLINICAL DATA:  Fall. EXAM: CT HEAD WITHOUT CONTRAST CT CERVICAL SPINE WITHOUT CONTRAST TECHNIQUE: Multidetector CT imaging of the head and cervical spine was performed following the standard protocol without intravenous contrast. Multiplanar CT image reconstructions of the cervical spine were also generated. COMPARISON:  CT head dated January 20, 2021. CT cervical spine dated September 12, 2020. FINDINGS: CT HEAD FINDINGS Brain: No evidence of acute infarction, hemorrhage, hydrocephalus, extra-axial collection or mass lesion/mass effect. Stable atrophy and chronic microvascular ischemic changes. Old right caudate and left basal ganglia lacunar infarcts again noted. Vascular: Calcified atherosclerosis at the skull base. No hyperdense vessel. Skull: Normal. Negative for fracture or focal lesion. Sinuses/Orbits: No acute finding. Other: Small left parieto-occipital scalp hematoma. CT CERVICAL SPINE FINDINGS Alignment: Reversal of the normal cervical lordosis. No traumatic malalignment. Skull base and vertebrae: No acute fracture. No primary bone lesion or focal pathologic  process. Soft tissues and spinal canal: No prevertebral fluid or swelling. No visible canal hematoma. Disc levels: Unchanged severe disc height loss and moderate uncovertebral hypertrophy at C3-C4 and C5-C6. Unchanged mild to moderate disc height loss at C2-C3. Upper chest: Mild biapical pleuroparenchymal scarring. Other:  None. IMPRESSION: 1. No acute intracranial abnormality. Small left parieto-occipital scalp hematoma. 2. No acute cervical spine fracture or traumatic malalignment. Electronically Signed   By: Titus Dubin M.D.   On: 02/03/2021 19:00   CT Cervical Spine Wo Contrast  Result Date: 02/03/2021 CLINICAL DATA:  Fall. EXAM: CT HEAD WITHOUT CONTRAST CT CERVICAL SPINE WITHOUT CONTRAST TECHNIQUE: Multidetector CT imaging of the head and cervical spine was performed following the standard protocol without intravenous contrast. Multiplanar CT image reconstructions of the cervical spine were also generated. COMPARISON:  CT head dated January 20, 2021. CT cervical spine dated September 12, 2020. FINDINGS: CT HEAD FINDINGS Brain: No evidence of acute infarction, hemorrhage, hydrocephalus, extra-axial collection or mass lesion/mass effect. Stable atrophy and chronic microvascular ischemic changes. Old right caudate and left basal ganglia lacunar infarcts again noted. Vascular: Calcified atherosclerosis at the skull base. No hyperdense vessel. Skull: Normal. Negative for fracture or focal lesion. Sinuses/Orbits: No acute finding. Other: Small left parieto-occipital scalp hematoma. CT CERVICAL SPINE FINDINGS Alignment: Reversal of the normal cervical lordosis. No traumatic malalignment. Skull base and vertebrae: No acute fracture. No primary bone lesion or focal pathologic process. Soft tissues and spinal canal: No prevertebral fluid or swelling. No visible canal hematoma. Disc levels: Unchanged severe disc height loss and moderate uncovertebral hypertrophy at C3-C4 and C5-C6. Unchanged mild to moderate disc  height loss at C2-C3. Upper chest: Mild biapical pleuroparenchymal scarring. Other: None. IMPRESSION: 1. No acute intracranial abnormality. Small left parieto-occipital scalp hematoma. 2. No acute cervical spine fracture or traumatic malalignment. Electronically Signed   By: Titus Dubin M.D.   On: 02/03/2021 19:00   DG Chest Port 1 View  Result Date: 02/03/2021 CLINICAL DATA:  Epigastric pain.  Patient also reports fall. EXAM: PORTABLE CHEST 1 VIEW COMPARISON:  01/20/2021 FINDINGS: The cardiomediastinal contours are normal. Streaky subsegmental opacities at the left lung base. Pulmonary vasculature is normal. No confluent consolidation, pleural effusion, or pneumothorax. No acute osseous abnormalities are seen. IMPRESSION: Streaky left lung base atelectasis. Electronically Signed   By: Keith Rake M.D.   On: 02/03/2021 17:11    Procedures Procedures    Medications Ordered in ED Medications  lactated ringers bolus 500 mL (500 mLs Intravenous Bolus 02/03/21 1903)    ED Course/ Medical Decision Making/ A&P Clinical Course as of 02/03/21 1956  Sat Feb 03, 2021  1738 CBC personally reviewed and interpreted with mild leukocytosis noted Urinalysis personally reviewed and interpreted with negative dip urine [DR]  1739 Patient initially very hypertensive with systolic of 476 and without intervention and settling in bed repeat blood pressures have been down to 150s [DR]  1804 C-Met reviewed and interpreted with hyperkalemia, renal failure with potassium 5.3, BUN 55, creatinine 2.38 BUN and creatinine appear stable from first prior is noted 2 weeks and 1 month ago COVID and flu test reviewed, interpreted and all negative Urinalysis obtained and is clear  [DR]  1806 Hyperkalemia noted at 5.3. EKG reviewed again and no evidence of acute hyperkalemic changes 500 cc of LR ordered and repeat potassium ordered [DR]    Clinical Course User Index [DR] Pattricia Boss, MD                            Medical Decision Making Patient presents with fall.  Unclear if mechanical, versus seizure, versus syncope.  Patient is unable to give me very much information regarding what occurred prior to the fall.  She has intermittently  said she thinks that she may have fallen out of bed but she continues to be unclear on the details.  She also reports that she has been urinating in her bed. Work-up here is significant for mild leukocytosis, CKD that appears stable from most recent creatinines checks of last month.  However, she does have some hyperkalemia noted.  EKG without acute changes. Patient is receiving IV fluids and will have recheck of potassium. CT head and neck without any evidence of acute traumatic injury CT abdomen obtained due to upper abdominal pain with nausea and vomiting without any evidence of acute abnormality Urinalysis reviewed and normal Discussed admission with Dr. Hal Hope Patient with fall versus syncope CKD with mild superimposed AKI hyperkalemia Hypertension monitored here in the ED has ranged between systolic 1 69-4 20 Currently at 150 without intervention Plan admission for syncope and hyperkalemia.  Amount and/or Complexity of Data Reviewed External Data Reviewed: labs. Labs: ordered. Decision-making details documented in ED Course. Radiology: ordered and independent interpretation performed. Decision-making details documented in ED Course. ECG/medicine tests: ordered and independent interpretation performed. Decision-making details documented in ED Course.     Final Clinical Impression(s) / ED Diagnoses Final diagnoses:  Fall, initial encounter  Altered mental status, unspecified altered mental status type  AKI (acute kidney injury) ([place])  Hypertension, unspecified type    Rx / DC Orders ED Discharge Orders     None         Pattricia Boss, MD 02/03/21 1956

## 2021-02-03 NOTE — ED Triage Notes (Signed)
Grandson called ems for N/V when ems arrived pt said she wanted to come for an upset stomach as they were loading her on the stretcher to bring her here. she stated she fell.  On arrival here the pt states she was sitting on the side of the bed then she woke up on the floor and states she got up on her knees and then started vomiting

## 2021-02-04 DIAGNOSIS — W19XXXA Unspecified fall, initial encounter: Secondary | ICD-10-CM | POA: Diagnosis not present

## 2021-02-04 DIAGNOSIS — I10 Essential (primary) hypertension: Secondary | ICD-10-CM

## 2021-02-04 DIAGNOSIS — N183 Chronic kidney disease, stage 3 unspecified: Secondary | ICD-10-CM | POA: Diagnosis not present

## 2021-02-04 DIAGNOSIS — R42 Dizziness and giddiness: Secondary | ICD-10-CM

## 2021-02-04 DIAGNOSIS — S0990XA Unspecified injury of head, initial encounter: Secondary | ICD-10-CM | POA: Diagnosis not present

## 2021-02-04 LAB — BASIC METABOLIC PANEL
Anion gap: 6 (ref 5–15)
BUN: 43 mg/dL — ABNORMAL HIGH (ref 8–23)
CO2: 27 mmol/L (ref 22–32)
Calcium: 9.8 mg/dL (ref 8.9–10.3)
Chloride: 103 mmol/L (ref 98–111)
Creatinine, Ser: 2.08 mg/dL — ABNORMAL HIGH (ref 0.44–1.00)
GFR, Estimated: 24 mL/min — ABNORMAL LOW (ref >60)
Glucose, Bld: 282 mg/dL — ABNORMAL HIGH (ref 70–99)
Potassium: 4 mmol/L (ref 3.5–5.1)
Sodium: 136 mmol/L (ref 135–145)

## 2021-02-04 LAB — CBC
HCT: 31 % — ABNORMAL LOW (ref 36.0–46.0)
Hemoglobin: 10 g/dL — ABNORMAL LOW (ref 12.0–15.0)
MCH: 29.9 pg (ref 26.0–34.0)
MCHC: 32.3 g/dL (ref 30.0–36.0)
MCV: 92.8 fL (ref 80.0–100.0)
Platelets: 294 10*3/uL (ref 150–400)
RBC: 3.34 MIL/uL — ABNORMAL LOW (ref 3.87–5.11)
RDW: 14.9 % (ref 11.5–15.5)
WBC: 10 10*3/uL (ref 4.0–10.5)
nRBC: 0 % (ref 0.0–0.2)

## 2021-02-04 LAB — CBG MONITORING, ED
Glucose-Capillary: 297 mg/dL — ABNORMAL HIGH (ref 70–99)
Glucose-Capillary: 264 mg/dL — ABNORMAL HIGH (ref 70–99)
Glucose-Capillary: 231 mg/dL — ABNORMAL HIGH (ref 70–99)
Glucose-Capillary: 80 mg/dL (ref 70–99)

## 2021-02-04 LAB — GLUCOSE, CAPILLARY
Glucose-Capillary: 278 mg/dL — ABNORMAL HIGH (ref 70–99)
Glucose-Capillary: 213 mg/dL — ABNORMAL HIGH (ref 70–99)

## 2021-02-04 MED ORDER — INSULIN ASPART 100 UNIT/ML IJ SOLN
0.0000 [IU] | Freq: Three times a day (TID) | INTRAMUSCULAR | Status: DC
  Administered 2021-02-04: 3 [IU] via SUBCUTANEOUS
  Administered 2021-02-04: 5 [IU] via SUBCUTANEOUS
  Administered 2021-02-05: 3 [IU] via SUBCUTANEOUS
  Administered 2021-02-05: 5 [IU] via SUBCUTANEOUS

## 2021-02-04 MED ORDER — ENOXAPARIN SODIUM 30 MG/0.3ML IJ SOSY
30.0000 mg | PREFILLED_SYRINGE | INTRAMUSCULAR | Status: DC
  Administered 2021-02-04 – 2021-02-05 (×2): 30 mg via SUBCUTANEOUS
  Filled 2021-02-04 (×2): qty 0.3

## 2021-02-04 MED ORDER — INSULIN NPH (HUMAN) (ISOPHANE) 100 UNIT/ML ~~LOC~~ SUSP
10.0000 [IU] | Freq: Every day | SUBCUTANEOUS | Status: DC
  Administered 2021-02-04: 10 [IU] via SUBCUTANEOUS
  Filled 2021-02-04: qty 10

## 2021-02-04 NOTE — Progress Notes (Addendum)
PROGRESS NOTE        PATIENT DETAILS Name: Courtney Goodman Age: 76 y.o. Sex: female Date of Birth: 1945-02-13 Admit Date: 02/03/2021 Admitting Physician Rise Patience, MD [id number], Laurian Brim, PA-C  Brief Narrative: In brief-76 year old female with dementia, HTN, DM-2, CKD stage IV who presented to the hospital following a mechanical fall.  Apparently patient was hypoglycemic at home-this was in the setting of vomiting and poor oral intake  Subjective: Lying comfortably in bed-denies any chest pain or shortness of breath.  Objective: Vitals: Blood pressure 136/68, pulse 63, temperature 98.2 F (36.8 C), temperature source Oral, resp. rate 14, SpO2 100 %.   Exam: Gen Exam:Alert awake-not in any distress HEENT:atraumatic, normocephalic Chest: B/L clear to auscultation anteriorly CVS:S1S2 regular Abdomen:soft non tender, non distended Extremities:no edema Neurology: Non focal Skin: no rash  Pertinent Labs/Radiology: Recent Labs  Lab 02/03/21 1628 02/03/21 1806 02/04/21 0340  WBC 10.7*  --  10.0  HGB 12.4  --  10.0*  PLT 261  --  294  NA 137  --  136  K 5.3*   < > 4.0  CREATININE 2.38*  --  2.08*  AST 38  --   --   ALT 17  --   --   ALKPHOS 53  --   --   BILITOT 0.7  --   --    < > = values in this interval not displayed.    Assessment/Plan: Mechanical fall: Likely due to hypoglycemia-CT head/C-spine negative for trauma.  Ambulate with PT and see how she does.  Hypoglycemia: Due to poor oral intake in the setting of vomiting.  Restarted NPH at significant lower dose than home dose-continue SSI and watch for recurrent episodes of hypoglycemia.  Vomiting:?  Viral prodrome-has resolved-tolerating diet-CT abdomen negative for acute abnormalities  CKD stage IV: Creatinine close to baseline  HTN: BP stable-continue Coreg  HLD: Continue statin/fenofibrate  GERD: Continue PPI  Dementia: Pleasantly confused this morning but able to  answer simple questions.  Continue Exelon  BMI Estimated body mass index is 23.91 kg/m as calculated from the following:   Height as of 02/01/21: 5\' 3"  (1.6 m).   Weight as of 02/01/21: 61.2 kg.   Procedures: None Consults: None DVT Prophylaxis: Lovenox Code Status:Full code  Family Communication: [name] Johnson-[phone]-updated over the phone on 1/8.  Time spent: 25- minutes-Greater than 50% of this time was spent in counseling, explanation of diagnosis, planning of further management, and coordination of care.   Disposition Plan: Status is: Observation  The patient remains OBS appropriate and will d/c before 2 midnights.  Await PT eval-watch for hypoglycemic episodes-if remains stable-we will plan to DC on 1/9.    Diet: Diet Order             Diet renal/carb modified with fluid restriction Diet-HS Snack? Nothing; Fluid restriction: 1200 mL Fluid; Room service appropriate? Yes; Fluid consistency: Thin  Diet effective now                     Antimicrobial agents: Anti-infectives (From admission, onward)    None        MEDICATIONS: Scheduled Meds:  carvedilol  6.25 mg Oral BID WC   fenofibrate  160 mg Oral Daily   ferrous sulfate  325 mg Oral Q breakfast   folic acid  1 mg  Oral q morning   insulin aspart  0-9 Units Subcutaneous TID WC   insulin NPH Human  10 Units Subcutaneous QAC breakfast   pantoprazole  40 mg Oral Daily   rivastigmine  4.6 mg Transdermal Daily   simvastatin  20 mg Oral QHS   Continuous Infusions: PRN Meds:.acetaminophen **OR** acetaminophen, hydrALAZINE   I have personally reviewed following labs and imaging studies  LABORATORY DATA: CBC: Recent Labs  Lab 02/03/21 1628 02/04/21 0340  WBC 10.7* 10.0  HGB 12.4 10.0*  HCT 39.3 31.0*  MCV 98.0 92.8  PLT 261 756    Basic Metabolic Panel: Recent Labs  Lab 02/03/21 1628 02/03/21 1806 02/04/21 0340  NA 137  --  136  K 5.3* 4.8 4.0  CL 105  --  103  CO2 21*  --  27   GLUCOSE 96  --  282*  BUN 55*  --  43*  CREATININE 2.38*  --  2.08*  CALCIUM 10.2  --  9.8    GFR: Estimated Creatinine Clearance: 19 mL/min (A) (by C-G formula based on SCr of 2.08 mg/dL (H)).  Liver Function Tests: Recent Labs  Lab 02/03/21 1628  AST 38  ALT 17  ALKPHOS 53  BILITOT 0.7  PROT 7.7  ALBUMIN 3.7   Recent Labs  Lab 02/03/21 1628  LIPASE 27   No results for input(s): AMMONIA in the last 168 hours.  Coagulation Profile: No results for input(s): INR, PROTIME in the last 168 hours.  Cardiac Enzymes: No results for input(s): CKTOTAL, CKMB, CKMBINDEX, TROPONINI in the last 168 hours.  BNP (last 3 results) No results for input(s): PROBNP in the last 8760 hours.  Lipid Profile: No results for input(s): CHOL, HDL, LDLCALC, TRIG, CHOLHDL, LDLDIRECT in the last 72 hours.  Thyroid Function Tests: No results for input(s): TSH, T4TOTAL, FREET4, T3FREE, THYROIDAB in the last 72 hours.  Anemia Panel: No results for input(s): VITAMINB12, FOLATE, FERRITIN, TIBC, IRON, RETICCTPCT in the last 72 hours.  Urine analysis:    Component Value Date/Time   COLORURINE YELLOW 02/03/2021 1628   APPEARANCEUR CLEAR 02/03/2021 1628   LABSPEC 1.015 02/03/2021 1628   LABSPEC 1.020 10/12/2018 1055   PHURINE 6.5 02/03/2021 1628   GLUCOSEU NEGATIVE 02/03/2021 1628   HGBUR NEGATIVE 02/03/2021 1628   BILIRUBINUR NEGATIVE 02/03/2021 1628   BILIRUBINUR negative 10/12/2018 1055   BILIRUBINUR n 07/01/2016 1139   KETONESUR NEGATIVE 02/03/2021 1628   PROTEINUR NEGATIVE 02/03/2021 1628   UROBILINOGEN negative (A) 07/01/2016 1139   NITRITE NEGATIVE 02/03/2021 1628   LEUKOCYTESUR NEGATIVE 02/03/2021 1628    Sepsis Labs: Lactic Acid, Venous    Component Value Date/Time   LATICACIDVEN 1.1 09/13/2020 0035    MICROBIOLOGY: Recent Results (from the past 240 hour(s))  Resp Panel by RT-PCR (Flu A&B, Covid) Urine, Catheterized     Status: None   Collection Time: 02/03/21  4:29 PM    Specimen: Urine, Catheterized; Nasopharyngeal(NP) swabs in vial transport medium  Result Value Ref Range Status   SARS Coronavirus 2 by RT PCR NEGATIVE NEGATIVE Final    Comment: (NOTE) SARS-CoV-2 target nucleic acids are NOT DETECTED.  The SARS-CoV-2 RNA is generally detectable in upper respiratory specimens during the acute phase of infection. The lowest concentration of SARS-CoV-2 viral copies this assay can detect is 138 copies/mL. A negative result does not preclude SARS-Cov-2 infection and should not be used as the sole basis for treatment or other patient management decisions. A negative result may occur with  improper  specimen collection/handling, submission of specimen other than nasopharyngeal swab, presence of viral mutation(s) within the areas targeted by this assay, and inadequate number of viral copies(<138 copies/mL). A negative result must be combined with clinical observations, patient history, and epidemiological information. The expected result is Negative.  Fact Sheet for Patients:  EntrepreneurPulse.com.au  Fact Sheet for Healthcare Providers:  IncredibleEmployment.be  This test is no t yet approved or cleared by the [place] FDA and  has been authorized for detection and/or diagnosis of SARS-CoV-2 by FDA under an Emergency Use Authorization (EUA). This EUA will remain  in effect (meaning this test can be used) for the duration of the COVID-19 declaration under Section 564(b)(1) of the Act, 21 U.S.C.section 360bbb-3(b)(1), unless the authorization is terminated  or revoked sooner.       Influenza A by PCR NEGATIVE NEGATIVE Final   Influenza B by PCR NEGATIVE NEGATIVE Final    Comment: (NOTE) The Xpert Xpress SARS-CoV-2/FLU/RSV plus assay is intended as an aid in the diagnosis of influenza from Nasopharyngeal swab specimens and should not be used as a sole basis for treatment. Nasal washings and aspirates are  unacceptable for Xpert Xpress SARS-CoV-2/FLU/RSV testing.  Fact Sheet for Patients: EntrepreneurPulse.com.au  Fact Sheet for Healthcare Providers: IncredibleEmployment.be  This test is not yet approved or cleared by the [place] FDA and has been authorized for detection and/or diagnosis of SARS-CoV-2 by FDA under an Emergency Use Authorization (EUA). This EUA will remain in effect (meaning this test can be used) for the duration of the COVID-19 declaration under Section 564(b)(1) of the Act, 21 U.S.C. section 360bbb-3(b)(1), unless the authorization is terminated or revoked.  Performed at [facility], [place] [address], [place], [place] [address]     RADIOLOGY STUDIES/RESULTS: CT ABDOMEN PELVIS WO CONTRAST  Result Date: 02/03/2021 CLINICAL DATA:  Abdomen pain with nausea and vomiting EXAM: CT ABDOMEN AND PELVIS WITHOUT CONTRAST TECHNIQUE: Multidetector CT imaging of the abdomen and pelvis was performed following the standard protocol without IV contrast. COMPARISON:  CT 12/30/2020, 09/26/2019, FINDINGS: Lower chest: Lung bases demonstrate bronchiectasis in the left lower lobe with chronic calcifications. No acute airspace disease or pleural effusion. Hepatobiliary: No focal liver abnormality is seen. No gallstones, gallbladder wall thickening, or biliary dilatation. Pancreas: Unremarkable. No pancreatic ductal dilatation or surrounding inflammatory changes. Spleen: Normal in size without focal abnormality. Adrenals/Urinary Tract: Adrenal glands are normal. Kidneys show no hydronephrosis. Small cyst exophytic to the lower pole right kidney. The bladder is normal. Stomach/Bowel: Stomach nonenlarged. No dilated small bowel. No acute bowel wall thickening. Negative appendix. Vascular/Lymphatic: Advanced aortic atherosclerosis. No aneurysm. No suspicious lymph node Reproductive: Status post hysterectomy. No adnexal masses. Other: Negative for pelvic  effusion or free air. Musculoskeletal: Chronic compression fracture superior endplate T11. Subacute to chronic compression fracture L1 involving the superior endplate. IMPRESSION: 1. No CT evidence for acute intra-abdominal or pelvic abnormality. 2. Other chronic findings as described above. Electronically Signed   By: Donavan Foil M.D.   On: 02/03/2021 19:02   CT Head Wo Contrast  Result Date: 02/03/2021 CLINICAL DATA:  Fall. EXAM: CT HEAD WITHOUT CONTRAST CT CERVICAL SPINE WITHOUT CONTRAST TECHNIQUE: Multidetector CT imaging of the head and cervical spine was performed following the standard protocol without intravenous contrast. Multiplanar CT image reconstructions of the cervical spine were also generated. COMPARISON:  CT head dated January 20, 2021. CT cervical spine dated September 12, 2020. FINDINGS: CT HEAD FINDINGS Brain: No evidence of acute infarction, hemorrhage, hydrocephalus, extra-axial  collection or mass lesion/mass effect. Stable atrophy and chronic microvascular ischemic changes. Old right caudate and left basal ganglia lacunar infarcts again noted. Vascular: Calcified atherosclerosis at the skull base. No hyperdense vessel. Skull: Normal. Negative for fracture or focal lesion. Sinuses/Orbits: No acute finding. Other: Small left parieto-occipital scalp hematoma. CT CERVICAL SPINE FINDINGS Alignment: Reversal of the normal cervical lordosis. No traumatic malalignment. Skull base and vertebrae: No acute fracture. No primary bone lesion or focal pathologic process. Soft tissues and spinal canal: No prevertebral fluid or swelling. No visible canal hematoma. Disc levels: Unchanged severe disc height loss and moderate uncovertebral hypertrophy at C3-C4 and C5-C6. Unchanged mild to moderate disc height loss at C2-C3. Upper chest: Mild biapical pleuroparenchymal scarring. Other: None. IMPRESSION: 1. No acute intracranial abnormality. Small left parieto-occipital scalp hematoma. 2. No acute cervical spine  fracture or traumatic malalignment. Electronically Signed   By: Titus Dubin M.D.   On: 02/03/2021 19:00   CT Cervical Spine Wo Contrast  Result Date: 02/03/2021 CLINICAL DATA:  Fall. EXAM: CT HEAD WITHOUT CONTRAST CT CERVICAL SPINE WITHOUT CONTRAST TECHNIQUE: Multidetector CT imaging of the head and cervical spine was performed following the standard protocol without intravenous contrast. Multiplanar CT image reconstructions of the cervical spine were also generated. COMPARISON:  CT head dated January 20, 2021. CT cervical spine dated September 12, 2020. FINDINGS: CT HEAD FINDINGS Brain: No evidence of acute infarction, hemorrhage, hydrocephalus, extra-axial collection or mass lesion/mass effect. Stable atrophy and chronic microvascular ischemic changes. Old right caudate and left basal ganglia lacunar infarcts again noted. Vascular: Calcified atherosclerosis at the skull base. No hyperdense vessel. Skull: Normal. Negative for fracture or focal lesion. Sinuses/Orbits: No acute finding. Other: Small left parieto-occipital scalp hematoma. CT CERVICAL SPINE FINDINGS Alignment: Reversal of the normal cervical lordosis. No traumatic malalignment. Skull base and vertebrae: No acute fracture. No primary bone lesion or focal pathologic process. Soft tissues and spinal canal: No prevertebral fluid or swelling. No visible canal hematoma. Disc levels: Unchanged severe disc height loss and moderate uncovertebral hypertrophy at C3-C4 and C5-C6. Unchanged mild to moderate disc height loss at C2-C3. Upper chest: Mild biapical pleuroparenchymal scarring. Other: None. IMPRESSION: 1. No acute intracranial abnormality. Small left parieto-occipital scalp hematoma. 2. No acute cervical spine fracture or traumatic malalignment. Electronically Signed   By: Titus Dubin M.D.   On: 02/03/2021 19:00   DG Chest Port 1 View  Result Date: 02/03/2021 CLINICAL DATA:  Epigastric pain.  Patient also reports fall. EXAM: PORTABLE CHEST 1  VIEW COMPARISON:  01/20/2021 FINDINGS: The cardiomediastinal contours are normal. Streaky subsegmental opacities at the left lung base. Pulmonary vasculature is normal. No confluent consolidation, pleural effusion, or pneumothorax. No acute osseous abnormalities are seen. IMPRESSION: Streaky left lung base atelectasis. Electronically Signed   By: Keith Rake M.D.   On: 02/03/2021 17:11     LOS: 0 days   Oren Binet, MD  [facility]    To contact the attending provider between 7A-7P or the covering provider during after hours 7P-7A, please log into the web site [email or web address] and access using universal [place] password for that web site. If you do not have the password, please call the hospital operator.  02/04/2021, 10:05 AM

## 2021-02-04 NOTE — Evaluation (Addendum)
Physical Therapy Evaluation Patient Details Name: Courtney Goodman MRN: [id number] DOB: 1946/01/04 Today's Date: 02/04/2021  History of Present Illness  Pt is a 76 y.o. female who presented 02/03/21 after a hypoglycemic episode with dizziness, nausea, and vomiting. Pt also had a fall in which she hit her head. Imaging negative for aucte intracranial abnormalities or fxs. Of note, pt with multiple ED visits since August 2022 for hypoglycemic issues. PMH: dementia, hypertension, diabetes mellitus type 2, chronic kidney disease stage IV, anemia   Clinical Impression  Pt presents with condition above and deficits mentioned below, see PT Problem List. PTA, she was living with her grandson in a ground-floor apartment, performing functional mobility with a cane mod I. Pt has a hx of multiple falls. She has an aide that comes normally 7 days/week from 10 am - 1 pm and her grandson works from 11 am to 5 pm. Her daughters check-in on pt intermittent between times aide and grandson are not present. Currently, pt displays deficits in cognition (worse once upright than supine, improved once supine again), balance, coordination, strength, and activity tolerance. Pt required minA to come to stand and up to modA to maintain her balance while taking a few steps with HHA on PT this date as she displayed a posterior and lateral lean that increased in intensity as the time in standing progressed. She is at high risk for falls. Expressed my concerns to pt and her grandson on pt being home alone from 2-5 pm daily as she is at high risk for falls, impulsive to try to get up when cued to remain supine repeatedly, and has significant cognitive deficits that would place her at risk for injury. She is not safe to be home alone. Due to her significant functional decline, recommending short-term rehab at a SNF to maximize her safety and independence with all functional mobility and reduce her caregiver burden. However, if pt does go home I  recommend max HH services, 24/7 assistance, extensive assistance for standing mobility, and for pt to remain in w/c if ever alone at home to maintain her safety. Expressed these recs to pt and grandson. Will continue to follow acutely.    BP: 146/85 sitting 131/112 after standing 177/91 supine end of session    Recommendations for follow up therapy are one component of a multi-disciplinary discharge planning process, led by the attending physician.  Recommendations may be updated based on patient status, additional functional criteria and insurance authorization.  Follow Up Recommendations Skilled nursing-short term rehab (<3 hours/day) (if goes home, max HH services and HHPT)    Assistance Recommended at Discharge Frequent or constant Supervision/Assistance  Patient can return home with the following  A lot of help with walking and/or transfers;A lot of help with bathing/dressing/bathroom;Assistance with cooking/housework;Direct supervision/assist for medications management;Direct supervision/assist for financial management;Assist for transportation;Help with stairs or ramp for entrance    Equipment Recommendations Rolling walker (2 wheels)  Recommendations for Other Services  OT consult    Functional Status Assessment Patient has had a recent decline in their functional status and demonstrates the ability to make significant improvements in function in a reasonable and predictable amount of time.     Precautions / Restrictions Precautions Precautions: Fall;Other (comment) Precaution Comments: monitor BP Restrictions Weight Bearing Restrictions: No      Mobility  Bed Mobility Overal bed mobility: Needs Assistance Bed Mobility: Supine to Sit;Sit to Supine     Supine to sit: Min guard;HOB elevated Sit to supine: Mod assist  General bed mobility comments: Extra time to transition supine > sit R edge of stretcher with HOB elevated. ModA to manage legs and trunk back to  supine.    Transfers Overall transfer level: Needs assistance Equipment used: 1 person hand held assist Transfers: Sit to/from Stand Sit to Stand: Min assist;From elevated surface           General transfer comment: Pt coming to stand from stretcher to PT anterior to her, placing hands on PT for support, minA to power up and steady.    Ambulation/Gait Ambulation/Gait assistance: Mod assist Gait Distance (Feet): 8 Feet Assistive device: 1 person hand held assist Gait Pattern/deviations: Step-to pattern;Decreased step length - right;Decreased step length - left;Decreased stride length;Shuffle;Leaning posteriorly;Decreased weight shift to right;Decreased weight shift to left Gait velocity: reduced Gait velocity interpretation: <1.31 ft/sec, indicative of household ambulator   General Gait Details: Pt holding onto PT anterior to her for support. Pt required extensive verbal and tactile cues to go any direction. Cues provided for shifting weight to take steps and to reduce posterior and lateral lean that worsened as time in standing progressed.  Stairs            Wheelchair Mobility    Modified Rankin (Stroke Patients Only) Modified Rankin (Stroke Patients Only) Pre-Morbid Rankin Score: Moderate disability Modified Rankin: Moderately severe disability     Balance Overall balance assessment: Needs assistance Sitting-balance support: Single extremity supported;Feet supported;No upper extremity supported Sitting balance-Leahy Scale: Fair Sitting balance - Comments: Static sitting EOB with min guard for safety. Postural control: Posterior lean;Right lateral lean Standing balance support: Bilateral upper extremity supported;During functional activity Standing balance-Leahy Scale: Poor Standing balance comment: Bil UE support and up to modA to stand today.                             Pertinent Vitals/Pain Pain Assessment: Faces Faces Pain Scale: No hurt Pain  Intervention(s): Monitored during session    Home Living Family/patient expects to be discharged to:: Private residence Living Arrangements: Other relatives (grandson) Available Help at Discharge: Family;Personal care attendant;Available PRN/intermittently Type of Home: Apartment Home Access: Level entry       Home Layout: One level Home Equipment: Cane - single point;Shower seat;Grab bars - tub/shower;BSC/3in1;Wheelchair - manual Additional Comments: Aide comes normally 7 days/week from 10 am - 1 pm and grandson works from 11 am to 5 pm, daughters check-in on pt intermittent between times aide and grandson are not present    Prior Function Prior Level of Function : Needs assist;Patient poor historian/Family not available;History of Falls (last six months)  Cognitive Assist : ADLs (cognitive)     Physical Assist : ADLs (physical)     Mobility Comments: Pt uses cane, mutiple falls ADLs Comments: Pt reports having assistance with tub transfers and does not shower without help.     Hand Dominance        Extremity/Trunk Assessment   Upper Extremity Assessment Upper Extremity Assessment: Defer to OT evaluation;Generalized weakness    Lower Extremity Assessment Lower Extremity Assessment: Generalized weakness    Cervical / Trunk Assessment Cervical / Trunk Assessment: Kyphotic  Communication   Communication: No difficulties  Cognition Arousal/Alertness: Awake/alert Behavior During Therapy: Flat affect Overall Cognitive Status: History of cognitive impairments - at baseline  General Comments: Dementia at baseline. Pt with slow processing and poor sequencing, needing simple step-by-step cues at times. Poor awareness into her leaning causing her to lose balance, despite cues to correct. Pt repeating words or going quiet when transitioned to sit compared to when supine, BP 146/85 sitting, 131/112 after standing, 177/91 supine end of  session, returned to normal once supine again        General Comments General comments (skin integrity, edema, etc.): BP 146/85 sitting, 131/112 after standing, 177/91 supine end of session; discussed home set-up, PLOF, and PT recs with grandson via phone (he called during session). Educated pt and son that pt would be unsafe to stand alone and she would benefit from being in w/c when alone and having assistance using a RW when standing/walking. Educated them that she needs near if not 24/7 assistance and PT recommending SNF at this time.    Exercises     Assessment/Plan    PT Assessment Patient needs continued PT services  PT Problem List Decreased strength;Decreased activity tolerance;Decreased range of motion;Decreased balance;Decreased mobility;Decreased coordination;Decreased cognition;Decreased knowledge of use of DME;Decreased safety awareness;Decreased knowledge of precautions;Cardiopulmonary status limiting activity       PT Treatment Interventions DME instruction;Gait training;Functional mobility training;Therapeutic activities;Therapeutic exercise;Balance training;Neuromuscular re-education;Cognitive remediation;Patient/family education;Wheelchair mobility training    PT Goals (Current goals can be found in the Care Plan section)  Acute Rehab PT Goals Patient Stated Goal: to go home PT Goal Formulation: With patient/family Time For Goal Achievement: 02/18/21 Potential to Achieve Goals: Fair    Frequency Min 3X/week     Co-evaluation               AM-PAC PT "6 Clicks" Mobility  Outcome Measure Help needed turning from your back to your side while in a flat bed without using bedrails?: A Little Help needed moving from lying on your back to sitting on the side of a flat bed without using bedrails?: A Little Help needed moving to and from a bed to a chair (including a wheelchair)?: A Lot Help needed standing up from a chair using your arms (e.g., wheelchair or  bedside chair)?: A Little Help needed to walk in hospital room?: Total Help needed climbing 3-5 steps with a railing? : Total 6 Click Score: 13    End of Session Equipment Utilized During Treatment: Gait belt Activity Tolerance: Patient tolerated treatment well Patient left: in bed;with call bell/phone within reach Nurse Communication: Mobility status;Other (comment) (BP, pt's change in communication/cognition during session) PT Visit Diagnosis: Unsteadiness on feet (R26.81);Other abnormalities of gait and mobility (R26.89);Muscle weakness (generalized) (M62.81);Repeated falls (R29.6);History of falling (Z91.81);Difficulty in walking, not elsewhere classified (R26.2);Dizziness and giddiness (R42)    Time: 3846-6599 PT Time Calculation (min) (ACUTE ONLY): 21 min   Charges:   PT Evaluation $PT Eval Moderate Complexity: 1 Mod          Moishe Spice, PT, DPT Acute Rehabilitation Services  Pager: [phone] Office: [phone]   Orvan Falconer 02/04/2021, 3:48 PM

## 2021-02-04 NOTE — ED Notes (Signed)
Neuro at bedside.

## 2021-02-05 DIAGNOSIS — R42 Dizziness and giddiness: Secondary | ICD-10-CM | POA: Diagnosis not present

## 2021-02-05 DIAGNOSIS — N183 Chronic kidney disease, stage 3 unspecified: Secondary | ICD-10-CM | POA: Diagnosis not present

## 2021-02-05 DIAGNOSIS — S0990XA Unspecified injury of head, initial encounter: Secondary | ICD-10-CM | POA: Diagnosis not present

## 2021-02-05 DIAGNOSIS — W19XXXA Unspecified fall, initial encounter: Secondary | ICD-10-CM | POA: Diagnosis not present

## 2021-02-05 DIAGNOSIS — I10 Essential (primary) hypertension: Secondary | ICD-10-CM | POA: Diagnosis not present

## 2021-02-05 LAB — GLUCOSE, CAPILLARY
Glucose-Capillary: 248 mg/dL — ABNORMAL HIGH (ref 70–99)
Glucose-Capillary: 284 mg/dL — ABNORMAL HIGH (ref 70–99)

## 2021-02-05 MED ORDER — INSULIN NPH (HUMAN) (ISOPHANE) 100 UNIT/ML ~~LOC~~ SUSP
15.0000 [IU] | Freq: Every day | SUBCUTANEOUS | Status: DC
  Administered 2021-02-05: 15 [IU] via SUBCUTANEOUS
  Filled 2021-02-05: qty 10

## 2021-02-05 MED ORDER — CARVEDILOL 12.5 MG PO TABS: ORAL_TABLET | ORAL | 2 refills | Status: DC

## 2021-02-05 MED ORDER — CARVEDILOL 12.5 MG PO TABS
12.5000 mg | ORAL_TABLET | Freq: Two times a day (BID) | ORAL | Status: DC
  Administered 2021-02-05: 12.5 mg via ORAL
  Filled 2021-02-05: qty 1

## 2021-02-05 MED ORDER — NOVOLIN N FLEXPEN 100 UNIT/ML ~~LOC~~ SUPN: 15.0000 [IU] | PEN_INJECTOR | SUBCUTANEOUS | 11 refills | Status: DC

## 2021-02-05 NOTE — Plan of Care (Signed)

## 2021-02-05 NOTE — Discharge Summary (Signed)
PATIENT DETAILS Name: Courtney Goodman Age: 76 y.o. Sex: female Date of Birth: 18-Mar-1945 MRN: [id number]. Admitting Physician: Rise Patience, MD KJZ:[email or web address], Laurian Brim, PA-C  Admit Date: 02/03/2021 Discharge date: 02/05/2021  Recommendations for Outpatient Follow-up:  Follow up with PCP in 1-2 weeks Please obtain CMP/CBC in one week  Admitted From:  Home  Disposition: [place]: Yes  Equipment/Devices: None  Discharge Condition: Stable  CODE STATUS: FULL CODE  Diet recommendation:  Diet Order             Diet - low sodium heart healthy           Diet Carb Modified           Diet renal/carb modified with fluid restriction Diet-HS Snack? Nothing; Fluid restriction: 1200 mL Fluid; Room service appropriate? Yes; Fluid consistency: Thin  Diet effective now                    Brief Summary: 76 year old female with history of HTN, DM-2, CKD stage IV, dementia-who presented to the hospital with hypoglycemia and a mechanical fall.  Brief Hospital Course: Mechanical fall: Likely due to hypoglycemia-CT head/C-spine negative for trauma.  Evaluated by physical therapy-discussed with case manager and then with patient's daughter over the phone-plans are to discharge home with home health services, patient's daughter is planning to get the patient to her house in the next 1 week or so.   Hypoglycemia: Due to poor oral intake in the setting of vomiting.  CBGs have stabilized-on discharge-NPH dose will be changed to 15 units daily (normally on 28 units at home).  Allow some amount of permissive hyperglycemia-avoid strict glycemic control.   Vomiting:?  Viral prodrome-has resolved-tolerating diet-CT abdomen negative for acute abnormalities   CKD stage IV: Creatinine close to baseline   HTN: BP stable-continue Coreg   HLD: Continue statin/fenofibrate  GERD: Continue PPI   Dementia: Pleasantly confused this morning but able to answer simple questions.   Continue Exelon   BMI Estimated body mass index is 23.91 kg/m as calculated from the following:   Height as of 02/01/21: 5\' 3"  (1.6 m).   Weight as of 02/01/21: 61.2 kg.    Procedures None  Discharge Diagnoses:  Principal Problem:   Dizziness Active Problems:   Essential hypertension   CKD (chronic kidney disease), stage III (HCC)   COPD (chronic obstructive pulmonary disease) (HCC)   CAD (coronary artery disease)   Type II diabetes mellitus with renal manifestations [facility])   Discharge Instructions:  Activity:  As tolerated with Full fall precautions use walker/cane & assistance as needed  Discharge Instructions     Call MD for:  difficulty breathing, headache or visual disturbances   Complete by: As directed    Call MD for:  persistant dizziness or light-headedness   Complete by: As directed    Diet - low sodium heart healthy   Complete by: As directed    Diet Carb Modified   Complete by: As directed    Discharge instructions   Complete by: As directed    Follow with Primary MD  Raenette Rover, Vickie L, PA-C in 1-2 weeks  Please get a complete blood count and chemistry panel checked by your Primary MD at your next visit, and again as instructed by your Primary MD.  Get Medicines reviewed and adjusted: Please take all your medications with you for your next visit with your Primary MD  Laboratory/radiological data: Please request your Primary MD to  go over all hospital tests and procedure/radiological results at the follow up, please ask your Primary MD to get all Hospital records sent to his/her office.  In some cases, they will be blood work, cultures and biopsy results pending at the time of your discharge. Please request that your primary care M.D. follows up on these results.  Also Note the following: If you experience worsening of your admission symptoms, develop shortness of breath, life threatening emergency, suicidal or homicidal thoughts you must seek medical  attention immediately by calling 911 or calling your MD immediately  if symptoms less severe.  You must read complete instructions/literature along with all the possible adverse reactions/side effects for all the Medicines you take and that have been prescribed to you. Take any new Medicines after you have completely understood and accpet all the possible adverse reactions/side effects.   Do not drive when taking Pain medications or sleeping medications (Benzodaizepines)  Do not take more than prescribed Pain, Sleep and Anxiety Medications. It is not advisable to combine anxiety,sleep and pain medications without talking with your primary care practitioner  Special Instructions: If you have smoked or chewed Tobacco  in the last 2 yrs please stop smoking, stop any regular Alcohol  and or any Recreational drug use.  Wear Seat belts while driving.  Please note: You were cared for by a hospitalist during your hospital stay. Once you are discharged, your primary care physician will handle any further medical issues. Please note that NO REFILLS for any discharge medications will be authorized once you are discharged, as it is imperative that you return to your primary care physician (or establish a relationship with a primary care physician if you do not have one) for your post hospital discharge needs so that they can reassess your need for medications and monitor your lab values.   1.  Check CBGs/blood glucose multiple times daily-and keep a record of these readings-take these with you to your next appointment with your primary care doctor.   Increase activity slowly   Complete by: As directed       Allergies as of 02/05/2021   No Known Allergies      Medication List     STOP taking these medications    amLODipine-benazepril 5-20 MG capsule Commonly known as: LOTREL   citalopram 20 MG tablet Commonly known as: CELEXA   mirtazapine 7.5 MG tablet Commonly known as: REMERON   traZODone  50 MG tablet Commonly known as: DESYREL       TAKE these medications    albuterol 108 (90 Base) MCG/ACT inhaler Commonly known as: VENTOLIN HFA Inhale 2 puffs into the lungs every 6 (six) hours as needed for wheezing or shortness of breath.   Alcohol Prep Pads Use for testing What changed:  how much to take how to take this when to take this additional instructions   aspirin EC 81 MG tablet Take 81 mg by mouth daily.   Biotin 10000 MCG Tabs Take 10,000 mcg by mouth daily.   carvedilol 12.5 MG tablet Commonly known as: COREG TAKE ONE TABLET BY MOUTH EVERY MORNING and TAKE ONE TABLET BY MOUTH EVERYDAY AT BEDTIME What changed: medication strength   Cholecalciferol 25 MCG (1000 UT) tablet Take 1 tablet by mouth daily. What changed:  when to take this Another medication with the same name was removed. Continue taking this medication, and follow the directions you see here.   fenofibrate 145 MG tablet Commonly known as: TRICOR Take 1 tablet (  145 mg total) by mouth daily.   FERROUS SULFATE PO Take 1 tablet by mouth daily.   fluticasone 50 MCG/ACT nasal spray Commonly known as: FLONASE Place 2 sprays into both nostrils daily. What changed:  how much to take when to take this reasons to take this   folic acid 1 MG tablet Commonly known as: FOLVITE Take 1 tablet (1 mg total) by mouth every morning.   memantine 10 MG tablet Commonly known as: NAMENDA Take 1 tablet (10 mg at night) for 2 weeks, then increase to 1 tablet (10 mg) twice a day What changed:  how much to take how to take this when to take this   NovoLIN N FlexPen 100 UNIT/ML Kiwkpen Generic drug: Insulin NPH (Human) (Isophane) Inject 15 Units into the skin every morning. And pen needles 1/day What changed: how much to take   omeprazole 40 MG capsule Commonly known as: PRILOSEC Take 1 capsule (40 mg total) by mouth every morning.   OneTouch Delica Plus [id number] Misc USE TO test four times A  DAY   OneTouch Verio test strip Generic drug: glucose blood 1 each by Other route 2 (two) times daily. And lancets 2/day   Pen Needles 32G X 5 MM Misc 1 each by Does not apply route daily.   rivastigmine 4.6 mg/24hr Commonly known as: EXELON Place 1 patch (4.6 mg total) onto the skin daily.   simvastatin 20 MG tablet Commonly known as: ZOCOR TAKE ONE TABLET BY MOUTH EVERYDAY AT BEDTIME What changed: See the new instructions.   Victoza 18 MG/3ML Sopn Generic drug: liraglutide Inject 0.6 mg into the skin every morning.        Follow-up Information     Raenette Rover, Vickie L, PA-C. Schedule an appointment as soon as possible for a visit in 1 week(s).   Specialty: Family Medicine Contact information: [facility] [id number] [phone]                No Known Allergies    Consultations:  None   Other Procedures/Studies: CT ABDOMEN PELVIS WO CONTRAST  Result Date: 02/03/2021 CLINICAL DATA:  Abdomen pain with nausea and vomiting EXAM: CT ABDOMEN AND PELVIS WITHOUT CONTRAST TECHNIQUE: Multidetector CT imaging of the abdomen and pelvis was performed following the standard protocol without IV contrast. COMPARISON:  CT 12/30/2020, 09/26/2019, FINDINGS: Lower chest: Lung bases demonstrate bronchiectasis in the left lower lobe with chronic calcifications. No acute airspace disease or pleural effusion. Hepatobiliary: No focal liver abnormality is seen. No gallstones, gallbladder wall thickening, or biliary dilatation. Pancreas: Unremarkable. No pancreatic ductal dilatation or surrounding inflammatory changes. Spleen: Normal in size without focal abnormality. Adrenals/Urinary Tract: Adrenal glands are normal. Kidneys show no hydronephrosis. Small cyst exophytic to the lower pole right kidney. The bladder is normal. Stomach/Bowel: Stomach nonenlarged. No dilated small bowel. No acute bowel wall thickening. Negative appendix. Vascular/Lymphatic: Advanced aortic  atherosclerosis. No aneurysm. No suspicious lymph node Reproductive: Status post hysterectomy. No adnexal masses. Other: Negative for pelvic effusion or free air. Musculoskeletal: Chronic compression fracture superior endplate T11. Subacute to chronic compression fracture L1 involving the superior endplate. IMPRESSION: 1. No CT evidence for acute intra-abdominal or pelvic abnormality. 2. Other chronic findings as described above. Electronically Signed   By: Donavan Foil M.D.   On: 02/03/2021 19:02   DG Chest 1 View  Result Date: 01/20/2021 CLINICAL DATA:  76 year old female with nausea and hypoglycemia. EXAM: CHEST  1 VIEW COMPARISON:  Portable chest 09/12/2020 and earlier. FINDINGS:  Portable AP semi upright view at 0807 hours. Low normal lung volumes. Normal cardiac size and mediastinal contours. Visualized tracheal air column is within normal limits. Allowing for portable technique the lungs are clear. No pneumothorax or pleural effusion. No acute osseous abnormality identified. IMPRESSION: Negative portable chest. Electronically Signed   By: Genevie Ann M.D.   On: 01/20/2021 08:19   CT Head Wo Contrast  Result Date: 02/03/2021 CLINICAL DATA:  Fall. EXAM: CT HEAD WITHOUT CONTRAST CT CERVICAL SPINE WITHOUT CONTRAST TECHNIQUE: Multidetector CT imaging of the head and cervical spine was performed following the standard protocol without intravenous contrast. Multiplanar CT image reconstructions of the cervical spine were also generated. COMPARISON:  CT head dated January 20, 2021. CT cervical spine dated September 12, 2020. FINDINGS: CT HEAD FINDINGS Brain: No evidence of acute infarction, hemorrhage, hydrocephalus, extra-axial collection or mass lesion/mass effect. Stable atrophy and chronic microvascular ischemic changes. Old right caudate and left basal ganglia lacunar infarcts again noted. Vascular: Calcified atherosclerosis at the skull base. No hyperdense vessel. Skull: Normal. Negative for fracture or focal  lesion. Sinuses/Orbits: No acute finding. Other: Small left parieto-occipital scalp hematoma. CT CERVICAL SPINE FINDINGS Alignment: Reversal of the normal cervical lordosis. No traumatic malalignment. Skull base and vertebrae: No acute fracture. No primary bone lesion or focal pathologic process. Soft tissues and spinal canal: No prevertebral fluid or swelling. No visible canal hematoma. Disc levels: Unchanged severe disc height loss and moderate uncovertebral hypertrophy at C3-C4 and C5-C6. Unchanged mild to moderate disc height loss at C2-C3. Upper chest: Mild biapical pleuroparenchymal scarring. Other: None. IMPRESSION: 1. No acute intracranial abnormality. Small left parieto-occipital scalp hematoma. 2. No acute cervical spine fracture or traumatic malalignment. Electronically Signed   By: Titus Dubin M.D.   On: 02/03/2021 19:00   CT Head Wo Contrast  Result Date: 01/20/2021 CLINICAL DATA:  Altered mental status, confusion. EXAM: CT HEAD WITHOUT CONTRAST TECHNIQUE: Contiguous axial images were obtained from the base of the skull through the vertex without intravenous contrast. COMPARISON:  None. FINDINGS: Brain: Old right basal ganglia lacunar infarct. There is atrophy and chronic small vessel disease changes. Vascular: No hyperdense vessel or unexpected calcification. Skull: No acute calvarial abnormality. Sinuses/Orbits: No acute findings. Mucosal thickening in the right maxillary sinus. Other: None IMPRESSION: Old right basal ganglia lacunar infarct. Atrophy, chronic microvascular disease. No acute intracranial abnormality. Electronically Signed   By: Rolm Baptise M.D.   On: 01/20/2021 08:05   CT Cervical Spine Wo Contrast  Result Date: 02/03/2021 CLINICAL DATA:  Fall. EXAM: CT HEAD WITHOUT CONTRAST CT CERVICAL SPINE WITHOUT CONTRAST TECHNIQUE: Multidetector CT imaging of the head and cervical spine was performed following the standard protocol without intravenous contrast. Multiplanar CT image  reconstructions of the cervical spine were also generated. COMPARISON:  CT head dated January 20, 2021. CT cervical spine dated September 12, 2020. FINDINGS: CT HEAD FINDINGS Brain: No evidence of acute infarction, hemorrhage, hydrocephalus, extra-axial collection or mass lesion/mass effect. Stable atrophy and chronic microvascular ischemic changes. Old right caudate and left basal ganglia lacunar infarcts again noted. Vascular: Calcified atherosclerosis at the skull base. No hyperdense vessel. Skull: Normal. Negative for fracture or focal lesion. Sinuses/Orbits: No acute finding. Other: Small left parieto-occipital scalp hematoma. CT CERVICAL SPINE FINDINGS Alignment: Reversal of the normal cervical lordosis. No traumatic malalignment. Skull base and vertebrae: No acute fracture. No primary bone lesion or focal pathologic process. Soft tissues and spinal canal: No prevertebral fluid or swelling. No visible canal hematoma. Disc levels: Unchanged  severe disc height loss and moderate uncovertebral hypertrophy at C3-C4 and C5-C6. Unchanged mild to moderate disc height loss at C2-C3. Upper chest: Mild biapical pleuroparenchymal scarring. Other: None. IMPRESSION: 1. No acute intracranial abnormality. Small left parieto-occipital scalp hematoma. 2. No acute cervical spine fracture or traumatic malalignment. Electronically Signed   By: Titus Dubin M.D.   On: 02/03/2021 19:00   DG Chest Port 1 View  Result Date: 02/03/2021 CLINICAL DATA:  Epigastric pain.  Patient also reports fall. EXAM: PORTABLE CHEST 1 VIEW COMPARISON:  01/20/2021 FINDINGS: The cardiomediastinal contours are normal. Streaky subsegmental opacities at the left lung base. Pulmonary vasculature is normal. No confluent consolidation, pleural effusion, or pneumothorax. No acute osseous abnormalities are seen. IMPRESSION: Streaky left lung base atelectasis. Electronically Signed   By: Keith Rake M.D.   On: 02/03/2021 17:11     TODAY-DAY OF  DISCHARGE:  Subjective:   Quenesha Okazaki today has no headache,no chest abdominal pain,no new weakness tingling or numbness, feels much better wants to go home today.   Objective:   Blood pressure 133/88, pulse 65, temperature (!) 97.5 F (36.4 C), temperature source Oral, resp. rate 16, height 5\' 3"  (1.6 m), weight 60.9 kg, SpO2 100 %.  Intake/Output Summary (Last 24 hours) at 02/05/2021 1016 Last data filed at 02/05/2021 0900 Gross per 24 hour  Intake 440 ml  Output --  Net 440 ml   Filed Weights   02/04/21 1645  Weight: 60.9 kg    Exam: Awake Alert, Oriented *3, No new F.N deficits, Normal affect [place].AT,PERRAL Supple Neck,No JVD, No cervical lymphadenopathy appriciated.  Symmetrical Chest wall movement, Good air movement bilaterally, CTAB RRR,No Gallops,Rubs or new Murmurs, No Parasternal Heave +ve B.Sounds, Abd Soft, Non tender, No organomegaly appriciated, No rebound -guarding or rigidity. No Cyanosis, Clubbing or edema, No new Rash or bruise   PERTINENT RADIOLOGIC STUDIES: CT ABDOMEN PELVIS WO CONTRAST  Result Date: 02/03/2021 CLINICAL DATA:  Abdomen pain with nausea and vomiting EXAM: CT ABDOMEN AND PELVIS WITHOUT CONTRAST TECHNIQUE: Multidetector CT imaging of the abdomen and pelvis was performed following the standard protocol without IV contrast. COMPARISON:  CT 12/30/2020, 09/26/2019, FINDINGS: Lower chest: Lung bases demonstrate bronchiectasis in the left lower lobe with chronic calcifications. No acute airspace disease or pleural effusion. Hepatobiliary: No focal liver abnormality is seen. No gallstones, gallbladder wall thickening, or biliary dilatation. Pancreas: Unremarkable. No pancreatic ductal dilatation or surrounding inflammatory changes. Spleen: Normal in size without focal abnormality. Adrenals/Urinary Tract: Adrenal glands are normal. Kidneys show no hydronephrosis. Small cyst exophytic to the lower pole right kidney. The bladder is normal. Stomach/Bowel: Stomach  nonenlarged. No dilated small bowel. No acute bowel wall thickening. Negative appendix. Vascular/Lymphatic: Advanced aortic atherosclerosis. No aneurysm. No suspicious lymph node Reproductive: Status post hysterectomy. No adnexal masses. Other: Negative for pelvic effusion or free air. Musculoskeletal: Chronic compression fracture superior endplate T11. Subacute to chronic compression fracture L1 involving the superior endplate. IMPRESSION: 1. No CT evidence for acute intra-abdominal or pelvic abnormality. 2. Other chronic findings as described above. Electronically Signed   By: Donavan Foil M.D.   On: 02/03/2021 19:02   CT Head Wo Contrast  Result Date: 02/03/2021 CLINICAL DATA:  Fall. EXAM: CT HEAD WITHOUT CONTRAST CT CERVICAL SPINE WITHOUT CONTRAST TECHNIQUE: Multidetector CT imaging of the head and cervical spine was performed following the standard protocol without intravenous contrast. Multiplanar CT image reconstructions of the cervical spine were also generated. COMPARISON:  CT head dated January 20, 2021. CT cervical spine  dated September 12, 2020. FINDINGS: CT HEAD FINDINGS Brain: No evidence of acute infarction, hemorrhage, hydrocephalus, extra-axial collection or mass lesion/mass effect. Stable atrophy and chronic microvascular ischemic changes. Old right caudate and left basal ganglia lacunar infarcts again noted. Vascular: Calcified atherosclerosis at the skull base. No hyperdense vessel. Skull: Normal. Negative for fracture or focal lesion. Sinuses/Orbits: No acute finding. Other: Small left parieto-occipital scalp hematoma. CT CERVICAL SPINE FINDINGS Alignment: Reversal of the normal cervical lordosis. No traumatic malalignment. Skull base and vertebrae: No acute fracture. No primary bone lesion or focal pathologic process. Soft tissues and spinal canal: No prevertebral fluid or swelling. No visible canal hematoma. Disc levels: Unchanged severe disc height loss and moderate uncovertebral  hypertrophy at C3-C4 and C5-C6. Unchanged mild to moderate disc height loss at C2-C3. Upper chest: Mild biapical pleuroparenchymal scarring. Other: None. IMPRESSION: 1. No acute intracranial abnormality. Small left parieto-occipital scalp hematoma. 2. No acute cervical spine fracture or traumatic malalignment. Electronically Signed   By: Titus Dubin M.D.   On: 02/03/2021 19:00   CT Cervical Spine Wo Contrast  Result Date: 02/03/2021 CLINICAL DATA:  Fall. EXAM: CT HEAD WITHOUT CONTRAST CT CERVICAL SPINE WITHOUT CONTRAST TECHNIQUE: Multidetector CT imaging of the head and cervical spine was performed following the standard protocol without intravenous contrast. Multiplanar CT image reconstructions of the cervical spine were also generated. COMPARISON:  CT head dated January 20, 2021. CT cervical spine dated September 12, 2020. FINDINGS: CT HEAD FINDINGS Brain: No evidence of acute infarction, hemorrhage, hydrocephalus, extra-axial collection or mass lesion/mass effect. Stable atrophy and chronic microvascular ischemic changes. Old right caudate and left basal ganglia lacunar infarcts again noted. Vascular: Calcified atherosclerosis at the skull base. No hyperdense vessel. Skull: Normal. Negative for fracture or focal lesion. Sinuses/Orbits: No acute finding. Other: Small left parieto-occipital scalp hematoma. CT CERVICAL SPINE FINDINGS Alignment: Reversal of the normal cervical lordosis. No traumatic malalignment. Skull base and vertebrae: No acute fracture. No primary bone lesion or focal pathologic process. Soft tissues and spinal canal: No prevertebral fluid or swelling. No visible canal hematoma. Disc levels: Unchanged severe disc height loss and moderate uncovertebral hypertrophy at C3-C4 and C5-C6. Unchanged mild to moderate disc height loss at C2-C3. Upper chest: Mild biapical pleuroparenchymal scarring. Other: None. IMPRESSION: 1. No acute intracranial abnormality. Small left parieto-occipital scalp  hematoma. 2. No acute cervical spine fracture or traumatic malalignment. Electronically Signed   By: Titus Dubin M.D.   On: 02/03/2021 19:00   DG Chest Port 1 View  Result Date: 02/03/2021 CLINICAL DATA:  Epigastric pain.  Patient also reports fall. EXAM: PORTABLE CHEST 1 VIEW COMPARISON:  01/20/2021 FINDINGS: The cardiomediastinal contours are normal. Streaky subsegmental opacities at the left lung base. Pulmonary vasculature is normal. No confluent consolidation, pleural effusion, or pneumothorax. No acute osseous abnormalities are seen. IMPRESSION: Streaky left lung base atelectasis. Electronically Signed   By: Keith Rake M.D.   On: 02/03/2021 17:11     PERTINENT LAB RESULTS: CBC: Recent Labs    02/03/21 1628 02/04/21 0340  WBC 10.7* 10.0  HGB 12.4 10.0*  HCT 39.3 31.0*  PLT 261 294   CMET CMP     Component Value Date/Time   NA 136 02/04/2021 0340   NA 131 (L) 09/19/2020 1521   NA 137 05/29/2011 1946   K 4.0 02/04/2021 0340   K 3.9 05/29/2011 1946   CL 103 02/04/2021 0340   CL 100 05/29/2011 1946   CO2 27 02/04/2021 0340   CO2 28 05/29/2011  1946   GLUCOSE 282 (H) 02/04/2021 0340   GLUCOSE 362 (H) 05/29/2011 1946   BUN 43 (H) 02/04/2021 0340   BUN 27 09/19/2020 1521   BUN 24 (H) 05/29/2011 1946   CREATININE 2.08 (H) 02/04/2021 0340   CREATININE 1.52 (H) 04/08/2018 1154   CREATININE 1.56 (H) 01/01/2017 0941   CALCIUM 9.8 02/04/2021 0340   CALCIUM 9.8 05/29/2011 1946   PROT 7.7 02/03/2021 1628   PROT 7.3 06/07/2020 1201   PROT 8.9 (H) 05/29/2011 1946   ALBUMIN 3.7 02/03/2021 1628   ALBUMIN 4.3 06/07/2020 1201   ALBUMIN 3.8 05/29/2011 1946   AST 38 02/03/2021 1628   AST 14 (L) 04/08/2018 1154   ALT 17 02/03/2021 1628   ALT 13 04/08/2018 1154   ALT 15 05/29/2011 1946   ALKPHOS 53 02/03/2021 1628   ALKPHOS 86 05/29/2011 1946   BILITOT 0.7 02/03/2021 1628   BILITOT 0.3 06/07/2020 1201   BILITOT 0.4 04/08/2018 1154   GFRNONAA 24 (L) 02/04/2021 0340    GFRNONAA 34 (L) 04/08/2018 1154   GFRNONAA 33 (L) 01/01/2017 0941   GFRAA 22 (L) 10/12/2019 0511   GFRAA 39 (L) 04/08/2018 1154   GFRAA 38 (L) 01/01/2017 0941    GFR Estimated Creatinine Clearance: 19 mL/min (A) (by C-G formula based on SCr of 2.08 mg/dL (H)). Recent Labs    02/03/21 1628  LIPASE 27   No results for input(s): CKTOTAL, CKMB, CKMBINDEX, TROPONINI in the last 72 hours. Invalid input(s): POCBNP No results for input(s): DDIMER in the last 72 hours. Recent Labs    02/03/21 2300  HGBA1C 9.3*   No results for input(s): CHOL, HDL, LDLCALC, TRIG, CHOLHDL, LDLDIRECT in the last 72 hours. No results for input(s): TSH, T4TOTAL, T3FREE, THYROIDAB in the last 72 hours.  Invalid input(s): FREET3 No results for input(s): VITAMINB12, FOLATE, FERRITIN, TIBC, IRON, RETICCTPCT in the last 72 hours. Coags: No results for input(s): INR in the last 72 hours.  Invalid input(s): PT Microbiology: Recent Results (from the past 240 hour(s))  Resp Panel by RT-PCR (Flu A&B, Covid) Urine, Catheterized     Status: None   Collection Time: 02/03/21  4:29 PM   Specimen: Urine, Catheterized; Nasopharyngeal(NP) swabs in vial transport medium  Result Value Ref Range Status   SARS Coronavirus 2 by RT PCR NEGATIVE NEGATIVE Final    Comment: (NOTE) SARS-CoV-2 target nucleic acids are NOT DETECTED.  The SARS-CoV-2 RNA is generally detectable in upper respiratory specimens during the acute phase of infection. The lowest concentration of SARS-CoV-2 viral copies this assay can detect is 138 copies/mL. A negative result does not preclude SARS-Cov-2 infection and should not be used as the sole basis for treatment or other patient management decisions. A negative result may occur with  improper specimen collection/handling, submission of specimen other than nasopharyngeal swab, presence of viral mutation(s) within the areas targeted by this assay, and inadequate number of viral copies(<138  copies/mL). A negative result must be combined with clinical observations, patient history, and epidemiological information. The expected result is Negative.  Fact Sheet for Patients:  EntrepreneurPulse.com.au  Fact Sheet for Healthcare Providers:  IncredibleEmployment.be  This test is no t yet approved or cleared by the [place] FDA and  has been authorized for detection and/or diagnosis of SARS-CoV-2 by FDA under an Emergency Use Authorization (EUA). This EUA will remain  in effect (meaning this test can be used) for the duration of the COVID-19 declaration under Section 564(b)(1) of the Act, 21 U.S.C.section  360bbb-3(b)(1), unless the authorization is terminated  or revoked sooner.       Influenza A by PCR NEGATIVE NEGATIVE Final   Influenza B by PCR NEGATIVE NEGATIVE Final    Comment: (NOTE) The Xpert Xpress SARS-CoV-2/FLU/RSV plus assay is intended as an aid in the diagnosis of influenza from Nasopharyngeal swab specimens and should not be used as a sole basis for treatment. Nasal washings and aspirates are unacceptable for Xpert Xpress SARS-CoV-2/FLU/RSV testing.  Fact Sheet for Patients: EntrepreneurPulse.com.au  Fact Sheet for Healthcare Providers: IncredibleEmployment.be  This test is not yet approved or cleared by the [place] FDA and has been authorized for detection and/or diagnosis of SARS-CoV-2 by FDA under an Emergency Use Authorization (EUA). This EUA will remain in effect (meaning this test can be used) for the duration of the COVID-19 declaration under Section 564(b)(1) of the Act, 21 U.S.C. section 360bbb-3(b)(1), unless the authorization is terminated or revoked.  Performed at [facility], [place] [address] [place], [place] [address]     FURTHER DISCHARGE INSTRUCTIONS:  Get Medicines reviewed and adjusted: Please take all your medications with you for your next  visit with your Primary MD  Laboratory/radiological data: Please request your Primary MD to go over all hospital tests and procedure/radiological results at the follow up, please ask your Primary MD to get all Hospital records sent to his/her office.  In some cases, they will be blood work, cultures and biopsy results pending at the time of your discharge. Please request that your primary care M.D. goes through all the records of your hospital data and follows up on these results.  Also Note the following: If you experience worsening of your admission symptoms, develop shortness of breath, life threatening emergency, suicidal or homicidal thoughts you must seek medical attention immediately by calling 911 or calling your MD immediately  if symptoms less severe.  You must read complete instructions/literature along with all the possible adverse reactions/side effects for all the Medicines you take and that have been prescribed to you. Take any new Medicines after you have completely understood and accpet all the possible adverse reactions/side effects.   Do not drive when taking Pain medications or sleeping medications (Benzodaizepines)  Do not take more than prescribed Pain, Sleep and Anxiety Medications. It is not advisable to combine anxiety,sleep and pain medications without talking with your primary care practitioner  Special Instructions: If you have smoked or chewed Tobacco  in the last 2 yrs please stop smoking, stop any regular Alcohol  and or any Recreational drug use.  Wear Seat belts while driving.  Please note: You were cared for by a hospitalist during your hospital stay. Once you are discharged, your primary care physician will handle any further medical issues. Please note that NO REFILLS for any discharge medications will be authorized once you are discharged, as it is imperative that you return to your primary care physician (or establish a relationship with a primary care  physician if you do not have one) for your post hospital discharge needs so that they can reassess your need for medications and monitor your lab values.  Total Time spent coordinating discharge including counseling, education and face to face time equals 35 minutes.  SignedOren Binet 02/05/2021 10:16 AM

## 2021-02-05 NOTE — Progress Notes (Signed)
DISCHARGE NOTE HOME [place] Date [facility] to be discharged Home per MD order. Discussed prescriptions and follow up appointments with the patient. Prescriptions given to patient; medication list explained in detail. Patient verbalized understanding.  Skin clean, dry and intact without evidence of skin break down, no evidence of skin tears noted. IV catheter discontinued intact. Site without signs and symptoms of complications. Dressing and pressure applied. Pt denies pain at the site currently. No complaints noted.  Patient free of lines, drains, and wounds.   An After Visit Summary (AVS) was printed and given to the patient. Patient escorted via wheelchair, and discharged home via private auto.  Dolores Hoose, RN

## 2021-02-05 NOTE — Evaluation (Addendum)
Occupational Therapy Evaluation Patient Details Name: Courtney Goodman MRN: [id number] DOB: 12/12/1945 Today's Date: 02/05/2021   History of Present Illness Pt is a 76 y.o. female who presented 02/03/21 after a hypoglycemic episode with dizziness, nausea, and vomiting. Pt also had a fall in which she hit her head. Imaging negative for aucte intracranial abnormalities or fxs. Of note, pt with multiple ED visits since August 2022 for hypoglycemic issues. PMH: dementia, hypertension, diabetes mellitus type 2, chronic kidney disease stage IV, anemia   Clinical Impression   PTA patient required assist with some ADLs, IADLs and completed mobility using cane.  Has assist from grandson and aide, but not 24/7 support.  She was admitted for above and limited by problem list below, including impaired balance, decreased activity tolerance and impaired cognition. She currently requires up to mod assist for ADLs and min guard for transfers and in room mobility using RW.  She reports feeling "woozy" after mobility in room, but VSS during session.  She will benefit from further OT services acutely and after dc at [facility] level, given 24/7 support at home due to cognition and balance to prevent further falls and optimize safety.  Will follow acutely.      Recommendations for follow up therapy are one component of a multi-disciplinary discharge planning process, led by the attending physician.  Recommendations may be updated based on patient status, additional functional criteria and insurance authorization.   Follow Up Recommendations  Home health OT    Assistance Recommended at Discharge Frequent or constant Supervision/Assistance  Patient can return home with the following A little help with walking and/or transfers;A little help with bathing/dressing/bathroom;Direct supervision/assist for medications management;Direct supervision/assist for financial management;Assistance with cooking/housework;Assist for  transportation    Functional Status Assessment  Patient has had a recent decline in their functional status and demonstrates the ability to make significant improvements in function in a reasonable and predictable amount of time.  Equipment Recommendations  BSC/3in1    Recommendations for Other Services       Precautions / Restrictions Precautions Precautions: Fall Restrictions Weight Bearing Restrictions: No      Mobility Bed Mobility               General bed mobility comments: OOB with PT upon arrival    Transfers Overall transfer level: Needs assistance Equipment used: Rolling walker (2 wheels) Transfers: Sit to/from Stand Sit to Stand: Min guard           General transfer comment: for safety      Balance Overall balance assessment: Needs assistance Sitting-balance support: Feet supported Sitting balance-Leahy Scale: Good     Standing balance support: During functional activity;No upper extremity supported Standing balance-Leahy Scale: Poor Standing balance comment: Washing hands at sink with min guard assist                           ADL either performed or assessed with clinical judgement   ADL Overall ADL's : Needs assistance/impaired     Grooming: Minimal assistance;Oral care;Sitting           Upper Body Dressing : Supervision/safety;Sitting   Lower Body Dressing: Min guard;Sit to/from stand   Toilet Transfer: Min guard;Ambulation;Rolling walker (2 wheels)   Toileting- Clothing Manipulation and Hygiene: Moderate assistance;Sit to/from stand Toileting - Clothing Manipulation Details (indicate cue type and reason): for throughness for hygiene     Functional mobility during ADLs: Min guard;Rolling walker (2 wheels);Cueing for safety;Cueing  for sequencing       Vision   Additional Comments: requires cueing to locate items at sink and on table, continue assessment     Perception     Praxis      Pertinent Vitals/Pain  Pain Assessment: Faces Faces Pain Scale: No hurt Pain Intervention(s): Monitored during session     Hand Dominance Right   Extremity/Trunk Assessment Upper Extremity Assessment Upper Extremity Assessment: Generalized weakness   Lower Extremity Assessment Lower Extremity Assessment: Defer to PT evaluation       Communication Communication Communication: No difficulties   Cognition Arousal/Alertness: Awake/alert Behavior During Therapy: Flat affect Overall Cognitive Status: History of cognitive impairments - at baseline                                 General Comments: Dementia at baseline. Needs step by step cues for ADL tasks, able to verbalize basic needs i.e toileting, but has difficulty describing symptoms. Decreased attention and requires cueing for redirection to task.  Does report Fear of falling and awareness to needing assistance at home.     General Comments  reports "woosy" feeling after mobility with PT but VSS stable    Exercises     Shoulder Instructions      Home Living Family/patient expects to be discharged to:: Private residence Living Arrangements: Other relatives;Children Available Help at Discharge: Family;Personal care attendant;Available PRN/intermittently Type of Home: Apartment Home Access: Level entry     Home Layout: One level     Bathroom Shower/Tub: Teacher, early years/pre: Standard     Home Equipment: Cane - single point;Shower seat;Grab bars - tub/shower;BSC/3in1;Wheelchair - manual   Additional Comments: Aide comes normally 7 days/week from 10 am - 1 pm and grandson works from 11 am to 5 pm, daughters check-in on pt intermittent between times aide and grandson are not present      Prior Functioning/Environment Prior Level of Function : Needs assist;Patient poor historian/Family not available;History of Falls (last six months)  Cognitive Assist : ADLs (cognitive)   ADLs (Cognitive): Step by step  cues Physical Assist : ADLs (physical)   ADLs (physical): IADLs;Bathing Mobility Comments: Pt uses cane, mutiple falls ADLs Comments: has assist with showers and IADLs        OT Problem List: Decreased strength;Impaired balance (sitting and/or standing);Decreased activity tolerance;Decreased cognition;Decreased safety awareness;Decreased knowledge of use of DME or AE;Decreased knowledge of precautions      OT Treatment/Interventions: Self-care/ADL training;DME and/or AE instruction;Therapeutic activities;Patient/family education;Balance training    OT Goals(Current goals can be found in the care plan section) Acute Rehab OT Goals Patient Stated Goal: to have help at home OT Goal Formulation: With patient Time For Goal Achievement: 02/19/21 Potential to Achieve Goals: Good  OT Frequency: Min 2X/week    Co-evaluation              AM-PAC OT "6 Clicks" Daily Activity     Outcome Measure Help from another person eating meals?: A Little Help from another person taking care of personal grooming?: A Little Help from another person toileting, which includes using toliet, bedpan, or urinal?: A Lot Help from another person bathing (including washing, rinsing, drying)?: A Little Help from another person to put on and taking off regular upper body clothing?: A Little Help from another person to put on and taking off regular lower body clothing?: A Little 6 Click Score: 17   End  of Session Equipment Utilized During Treatment: Gait belt;Rolling walker (2 wheels) Nurse Communication: Mobility status  Activity Tolerance: Patient tolerated treatment well Patient left: in chair;with call bell/phone within reach;with chair alarm set  OT Visit Diagnosis: Other abnormalities of gait and mobility (R26.89);Muscle weakness (generalized) (M62.81);History of falling (Z91.81)                Time: 2591-0289 OT Time Calculation (min): 19 min Charges:  OT General Charges $OT Visit: 1 Visit OT  Evaluation $OT Eval Moderate Complexity: 1 Mod  Jolaine Artist, OT Acute Rehabilitation Services Pager [phone] Office [phone]   Courtney Goodman 02/05/2021, 9:46 AM

## 2021-02-05 NOTE — Progress Notes (Signed)
Called patients daughter with discharge instructions.  Patient is eating lunch and her daughter will be here at 1:00pm

## 2021-02-05 NOTE — Plan of Care (Signed)
°  Problem: Acute Rehab PT Goals(only PT should resolve) Goal: Pt Will Go Supine/Side To Sit Outcome: Adequate for Discharge Goal: Pt Will Go Sit To Supine/Side Outcome: Adequate for Discharge Goal: Patient Will Transfer Sit To/From Stand Outcome: Adequate for Discharge Goal: Pt Will Transfer Bed To Chair/Chair To Bed Outcome: Adequate for Discharge Goal: Pt Will Ambulate Outcome: Adequate for Discharge   Problem: Education: Goal: Knowledge of General Education information will improve Description: Including pain rating scale, medication(s)/side effects and non-pharmacologic comfort measures Outcome: Adequate for Discharge   Problem: Health Behavior/Discharge Planning: Goal: Ability to manage health-related needs will improve Outcome: Adequate for Discharge   Problem: Clinical Measurements: Goal: Ability to maintain clinical measurements within normal limits will improve Outcome: Adequate for Discharge Goal: Will remain free from infection Outcome: Adequate for Discharge Goal: Diagnostic test results will improve Outcome: Adequate for Discharge Goal: Respiratory complications will improve Outcome: Adequate for Discharge Goal: Cardiovascular complication will be avoided Outcome: Adequate for Discharge   Problem: Activity: Goal: Risk for activity intolerance will decrease Outcome: Adequate for Discharge   Problem: Nutrition: Goal: Adequate nutrition will be maintained Outcome: Adequate for Discharge   Problem: Coping: Goal: Level of anxiety will decrease Outcome: Adequate for Discharge   Problem: Elimination: Goal: Will not experience complications related to bowel motility Outcome: Adequate for Discharge Goal: Will not experience complications related to urinary retention Outcome: Adequate for Discharge   Problem: Pain Managment: Goal: General experience of comfort will improve Outcome: Adequate for Discharge   Problem: Safety: Goal: Ability to remain free from  injury will improve Outcome: Adequate for Discharge   Problem: Skin Integrity: Goal: Risk for impaired skin integrity will decrease Outcome: Adequate for Discharge   Problem: Acute Rehab OT Goals (only OT should resolve) Goal: Pt. Will Perform Grooming Outcome: Adequate for Discharge Goal: Pt. Will Perform Lower Body Dressing Outcome: Adequate for Discharge Goal: Pt. Will Transfer To Toilet Outcome: Adequate for Discharge Goal: Pt. Will Perform Toileting-Clothing Manipulation Outcome: Adequate for Discharge Goal: OT Additional ADL Goal #1 Outcome: Adequate for Discharge

## 2021-02-05 NOTE — TOC Transition Note (Signed)
Transition of Care [facility]) - CM/SW Discharge Note   Patient Details  Name: MISHAWN DIDION MRN: [id number] Date of Birth: [birth year]  [facility]) CM/SW Contact:  Tom-Johnson, Renea Ee, RN Phone Number: 02/05/2021, 2:24 PM   Clinical Narrative:    Patient is scheduled for discharge today. PT/OT recommended home health services. CM spoke with patient at bedside and patient told CM to call and speak with daughter, [name]. CM called [name] [phone]) about home health recommendations. [name] states patient will be moving in with her over the weekend and does not need PT/OT. States she is able to assist patient with the exercises she needs. States patient has all necessary DME's at home. States she will be transporting patient at discharge. No further TOC needs noted.   Final next level of care: Home/Self Care Barriers to Discharge: Barriers Resolved   Patient Goals and CMS Choice Patient states their goals for this hospitalization and ongoing recovery are:: To return home CMS Medicare.gov Compare Post Acute Care list provided to:: Patient Choice offered to / list presented to : Patient, Adult Children (Daughter, [name].)  Discharge Placement                       Discharge Plan and Services                DME Arranged: Other see comment (Daughter, [name] declines.Patient has all necessary DME's at home.) DME Agency: NA       HH Arranged: Refused HH [place] Agency: NA        Social Determinants of Health (SDOH) Interventions     Readmission Risk Interventions No flowsheet data found.

## 2021-02-05 NOTE — Care Management Obs Status (Signed)
[place] NOTIFICATION   Patient Details  Name: Courtney Goodman MRN: [id number] Date of Birth: [birth year]   Medicare Observation Status Notification Given:  Yes    Tom-Johnson, Renea Ee, RN 02/05/2021, 9:20 AM

## 2021-02-05 NOTE — Progress Notes (Signed)
Physical Therapy Treatment Patient Details Name: Courtney Goodman MRN: [id number] DOB: [birth year] Today's Date: 02/05/2021   History of Present Illness Pt is a 76 y.o. female who presented 02/03/21 after a hypoglycemic episode with dizziness, nausea, and vomiting. Pt also had a fall in which she hit her head. Imaging negative for aucte intracranial abnormalities or fxs. Of note, pt with multiple ED visits since August 2022 for hypoglycemic issues. PMH: dementia, hypertension, diabetes mellitus type 2, chronic kidney disease stage IV, anemia    PT Comments    Pt progressing steadily towards her physical therapy goals, requiring less assist overall for functional mobility this session. Pt performing transfers without physical assist and ambulating room distances with a walker and min assist for balance. Demonstrates a left lateral lean. Pt reports mild "wooziness," when asked; BP 133/88 sitting. Pt has a 4 hour time gap during the day where she is alone, in between when her caregiver leaves and grandson reports from work. If pt family able to put together increased supervision, pt would likely benefit from returning to familiar environment with follow up [place]. Discussed with MD.   Recommendations for follow up therapy are one component of a multi-disciplinary discharge planning process, led by the attending physician.  Recommendations may be updated based on patient status, additional functional criteria and insurance authorization.  Follow Up Recommendations  Home health PT     Assistance Recommended at Discharge Frequent or constant Supervision/Assistance  Patient can return home with the following Assistance with cooking/housework;Direct supervision/assist for medications management;Direct supervision/assist for financial management;Assist for transportation;Help with stairs or ramp for entrance;A little help with walking and/or transfers;A little help with bathing/dressing/bathroom   Equipment  Recommendations  Rolling walker (2 wheels)    Recommendations for Other Services       Precautions / Restrictions Precautions Precautions: Fall Restrictions Weight Bearing Restrictions: No     Mobility  Bed Mobility               General bed mobility comments: Sitting EOB upon arrival    Transfers Overall transfer level: Needs assistance Equipment used: Rolling walker (2 wheels) Transfers: Sit to/from Stand Sit to Stand: Supervision;Min guard           General transfer comment: Supervision-min guard assist to rise from edge of bed and low toilet height    Ambulation/Gait Ambulation/Gait assistance: Min assist Gait Distance (Feet): 25 Feet Assistive device: Rolling walker (2 wheels) Gait Pattern/deviations: Decreased stride length;Shuffle;Step-through pattern Gait velocity: reduced     General Gait Details: Pt with left lateral lean throughout gait, requiring minA for balance and step by step cues for environmental negotiation   Stairs             Wheelchair Mobility    Modified Rankin (Stroke Patients Only) Modified Rankin (Stroke Patients Only) Pre-Morbid Rankin Score: Moderate disability Modified Rankin: Moderately severe disability     Balance Overall balance assessment: Needs assistance Sitting-balance support: Feet supported Sitting balance-Leahy Scale: Good     Standing balance support: During functional activity;No upper extremity supported Standing balance-Leahy Scale: Poor Standing balance comment: Washing hands at sink with min guard assist                            Cognition Arousal/Alertness: Awake/alert Behavior During Therapy: Flat affect Overall Cognitive Status: History of cognitive impairments - at baseline  General Comments: Dementia at baseline. Needs step by step cues for ADL tasks, able to verbalize basic needs i.e toileting, but has difficulty describing  symptoms        Exercises      General Comments        Pertinent Vitals/Pain Pain Assessment: Faces Faces Pain Scale: No hurt    Home Living                          Prior Function            PT Goals (current goals can now be found in the care plan section) Acute Rehab PT Goals Patient Stated Goal: to go home PT Goal Formulation: With patient/family Time For Goal Achievement: 02/18/21 Potential to Achieve Goals: Fair Progress towards PT goals: Progressing toward goals    Frequency    Min 3X/week      PT Plan Discharge plan needs to be updated    Co-evaluation              AM-PAC PT "6 Clicks" Mobility   Outcome Measure  Help needed turning from your back to your side while in a flat bed without using bedrails?: A Little Help needed moving from lying on your back to sitting on the side of a flat bed without using bedrails?: A Little Help needed moving to and from a bed to a chair (including a wheelchair)?: A Little Help needed standing up from a chair using your arms (e.g., wheelchair or bedside chair)?: A Little Help needed to walk in hospital room?: A Little Help needed climbing 3-5 steps with a railing? : A Lot 6 Click Score: 17    End of Session Equipment Utilized During Treatment: Gait belt Activity Tolerance: Patient tolerated treatment well Patient left: with call bell/phone within reach;in chair;with chair alarm set Nurse Communication: Mobility status PT Visit Diagnosis: Unsteadiness on feet (R26.81);Other abnormalities of gait and mobility (R26.89);Muscle weakness (generalized) (M62.81);Repeated falls (R29.6);History of falling (Z91.81);Difficulty in walking, not elsewhere classified (R26.2);Dizziness and giddiness (R42)     Time: 1638-4536 PT Time Calculation (min) (ACUTE ONLY): 15 min  Charges:  $Therapeutic Activity: 8-22 mins                     Wyona Almas, PT, DPT Acute Rehabilitation Services Pager  [phone] Office [phone]    Deno Etienne 02/05/2021, 9:06 AM

## 2021-02-06 ENCOUNTER — Telehealth: Payer: Self-pay

## 2021-02-06 ENCOUNTER — Other Ambulatory Visit: Payer: Self-pay

## 2021-02-06 NOTE — Telephone Encounter (Signed)
Transition Care Management Unsuccessful Follow-up Telephone Call  Date of discharge and from where:  [facility]  Attempts:  1st Attempt  Reason for unsuccessful TCM follow-up call:  Left voice message

## 2021-02-06 NOTE — Patient Outreach (Signed)
[place] [facility]) Care Management  02/06/2021  Courtney Goodman 09-19-1945 [id number]   Telephone Assessment Transition Of Care     RN CM received notification that patient discharged home on 02/05/2021 following inpatient admission for fall, AMS,hypoglycemia. Outreach attempt #1 to patient. Call went straight to voicemail which is not set up.       Plan: RN CM will make outreach attempt within 4 business days to patient.   Enzo Montgomery, RN,BSN,CCM [place] Management Telephonic Care Management Coordinator Direct Phone: [phone] Toll Free: [phone] Fax: [phone]

## 2021-02-07 ENCOUNTER — Other Ambulatory Visit: Payer: Self-pay

## 2021-02-07 NOTE — Patient Outreach (Signed)
[place] [facility]) Care Management  02/07/2021  Vivion Romano Feger 08/17/1945 [id number]   Telephone Assessment Transition Of Care    Outreach attempt #2 to patient. Spoke with daughter-[name] briefly as she reported they had a lot going on at present. She states that she is in the process of moving patient into her home where she can assist with her mother's care.  She states that patient is doing better since returning home. She confirms she has all of patient's meds but unable to complete med review at this time. She reports MD office called yesterday and she has to call back to scheduled follow up appt. Daughter voiced that she has a lot going on right now with and requested call back in a couple of weeks once things settle down and patient is moved.     Plan: RN CM discussed with caregiver next outreach within the month of Feb. Caregiver agrees to care plan and follow up.  Enzo Montgomery, RN,BSN,CCM [place] Management Telephonic Care Management Coordinator Direct Phone: [phone] Toll Free: [phone] Fax: [phone]

## 2021-02-15 ENCOUNTER — Inpatient Hospital Stay: Admission: RE | Admit: 2021-02-15 | Payer: Medicare HMO | Source: Ambulatory Visit

## 2021-02-15 ENCOUNTER — Telehealth: Payer: Self-pay | Admitting: Physician Assistant

## 2021-02-15 NOTE — Telephone Encounter (Signed)
Patients daughter called concerned about the medicine that Courtney Goodman is taking.  The medicine is Memantine.  Please call back.

## 2021-02-16 NOTE — Telephone Encounter (Signed)
Asking if it is the Memantine is causing her dizziness. Pt was in the hospital on the 2nd they increased her Carvedilol after the increase in that is when the dizziness started pt daughter advised to call PCP to ger her seen so they can see if they can re adjust the carvedilol, pt daughter calling PCP after got off the phone sending this as Juluis Rainier

## 2021-02-16 NOTE — Telephone Encounter (Signed)
Pt is calling her PCP about her dizziness. After her increase in Carvedilol

## 2021-02-19 ENCOUNTER — Other Ambulatory Visit: Payer: Self-pay

## 2021-02-19 ENCOUNTER — Ambulatory Visit (INDEPENDENT_AMBULATORY_CARE_PROVIDER_SITE_OTHER): Payer: Medicare HMO | Admitting: Medical

## 2021-02-19 VITALS — BP 130/80 | HR 70 | Temp 97.6°F | Wt 142.4 lb

## 2021-02-19 DIAGNOSIS — F0390 Unspecified dementia without behavioral disturbance: Secondary | ICD-10-CM | POA: Diagnosis not present

## 2021-02-19 DIAGNOSIS — N183 Chronic kidney disease, stage 3 unspecified: Secondary | ICD-10-CM | POA: Diagnosis not present

## 2021-02-19 DIAGNOSIS — D631 Anemia in chronic kidney disease: Secondary | ICD-10-CM

## 2021-02-19 DIAGNOSIS — E162 Hypoglycemia, unspecified: Secondary | ICD-10-CM | POA: Diagnosis not present

## 2021-02-19 DIAGNOSIS — Z794 Long term (current) use of insulin: Secondary | ICD-10-CM

## 2021-02-19 DIAGNOSIS — I10 Essential (primary) hypertension: Secondary | ICD-10-CM

## 2021-02-19 DIAGNOSIS — E1165 Type 2 diabetes mellitus with hyperglycemia: Secondary | ICD-10-CM | POA: Diagnosis not present

## 2021-02-19 DIAGNOSIS — N184 Chronic kidney disease, stage 4 (severe): Secondary | ICD-10-CM

## 2021-02-19 DIAGNOSIS — R634 Abnormal weight loss: Secondary | ICD-10-CM | POA: Diagnosis not present

## 2021-02-19 NOTE — Progress Notes (Signed)
Subjective: Chief Complaint  Patient presents with   discuss medication    Increased medication from 6.25mg  to 12.5 mg twice a day of carvedilol, and also namenda. She feels drug and woozy feeling and was told these meds can make her feel this way but if her bp is normal, daughter Courtney Goodman-  will not give bp med.    Here today with daughter Courtney Goodman who along with her siblings are helping to take care of Courtney Goodman.  She has a history of hypertension, diabetes, CKD stage IV, dementia.  She had a recent hospitalization for hypoglycemia and fall.  She was hospitalized January 7 through February 05, 2021  Main concern today is medications and potential risk  Courtney Goodman does most of the talking today and provides most of the history here  In recent months she has had follow-up with endocrinology and neurology.  Some days are better than others in terms of confusion and memory  She was recently increased on of her memory medications  Hypertension-prior to the February 03, 2021 hospitalization she was on carvedilol 6.25 mg twice daily, amlodipine benazepril.  However amlodipine and aspirin was discontinued by nephrology back in September.  Courtney Goodman notes that this was discontinued at that time although the January hospitalization suggest she was still on amlodipine benazepril.  She notes though that the carvedilol dose was changed to 12.5 mg twice daily with this hospitalization.  She has not been giving her that high of a dose as she is scared it would drop her blood pressure too low.  Some days she already seems dizzy or little woozy.  Courtney Goodman just got a blood pressure cuff today and will start checking her blood pressures at home.  She wants some guidance on blood pressures.  Currently she is still administering carvedilol 6.25 mg twice daily  Regarding blood sugars, she is on Victoza 0.6 mg daily and 15 units of NPH insulin.  She was on 28 units of insulin before the hospitalization but after having a hypoglycemic  reading this was cut significantly back to 15 units.  Courtney Goodman is checking her sugars but the meter sometimes ask than usual.  A reading fasting this morning was 229  Of note, per hospital discharge instructions citalopram, trazodone, mirtazapine were discontinued per hospitalization notes  No other c/o.     Past Medical History:  Diagnosis Date   Anemia of chronic disease    Asthma    Atherosclerotic heart disease of native coronary artery without angina pectoris    CKD (chronic kidney disease)    COPD (chronic obstructive pulmonary disease) (HCC)    DM (diabetes mellitus), type 2 with renal complications (HCC)    Dysrhythmia    Elevated ferritin 01/03/2017   GAD (generalized anxiety disorder)    Hypertension    Mixed hyperlipidemia due to type 2 diabetes mellitus (HCC)    Mixed incontinence    per medical records from [facility]   Osteopenia    Personal history of noncompliance with medical treatment, presenting hazards to health    Shortness of breath dyspnea    Suicidal ideation    TB (tuberculosis), treated    age 76   Vitamin D deficiency    Current Outpatient Medications on File Prior to Visit  Medication Sig Dispense Refill   albuterol (PROVENTIL HFA;VENTOLIN HFA) 108 (90 Base) MCG/ACT inhaler Inhale 2 puffs into the lungs every 6 (six) hours as needed for wheezing or shortness of breath. 1 Inhaler 0   Alcohol Swabs (ALCOHOL PREP)  PADS Use for testing (Patient taking differently: 1 each by Other route 2 (two) times daily. E11.9) 100 each 1   aspirin EC 81 MG tablet Take 81 mg by mouth daily.      Biotin 10000 MCG TABS Take 10,000 mcg by mouth daily.     carvedilol (COREG) 12.5 MG tablet TAKE ONE TABLET BY MOUTH EVERY MORNING and TAKE ONE TABLET BY MOUTH EVERYDAY AT BEDTIME 60 tablet 2   Cholecalciferol 25 MCG (1000 UT) tablet Take 1 tablet by mouth daily. (Patient taking differently: Take 1,000 Units by mouth at bedtime.) 90 tablet 0   fenofibrate (TRICOR) 145 MG tablet Take 1  tablet (145 mg total) by mouth daily. 30 tablet 2   FERROUS SULFATE PO Take 1 tablet by mouth daily.     fluticasone (FLONASE) 50 MCG/ACT nasal spray Place 2 sprays into both nostrils daily. (Patient taking differently: Place 1 spray into both nostrils daily as needed for allergies.) 48 g 0   folic acid (FOLVITE) 1 MG tablet Take 1 tablet (1 mg total) by mouth every morning. 30 tablet 2   glucose blood (ONETOUCH VERIO) test strip 1 each by Other route 2 (two) times daily. And lancets 2/day 100 each 12   Insulin NPH, Human,, Isophane, (NOVOLIN N FLEXPEN) 100 UNIT/ML Kiwkpen Inject 15 Units into the skin every morning. And pen needles 1/day 30 mL 11   Insulin Pen Needle (PEN NEEDLES) 32G X 5 MM MISC 1 each by Does not apply route daily. 100 each 3   Lancets (ONETOUCH DELICA PLUS EQASTM19Q) MISC USE TO test four times A DAY 400 each 0   liraglutide (VICTOZA) 18 MG/3ML SOPN Inject 0.6 mg into the skin every morning. 27 mL 0   memantine (NAMENDA) 10 MG tablet Take 1 tablet (10 mg at night) for 2 weeks, then increase to 1 tablet (10 mg) twice a day (Patient taking differently: Take 10 mg by mouth See admin instructions. Take 1 tablet (10 mg at night) for 2 weeks, then increase to 1 tablet (10 mg) twice a day) 60 tablet 11   omeprazole (PRILOSEC) 40 MG capsule Take 1 capsule (40 mg total) by mouth every morning. 30 capsule 2   rivastigmine (EXELON) 4.6 mg/24hr Place 1 patch (4.6 mg total) onto the skin daily. 90 patch 3   simvastatin (ZOCOR) 20 MG tablet TAKE ONE TABLET BY MOUTH EVERYDAY AT BEDTIME (Patient taking differently: Take 20 mg by mouth at bedtime.) 30 tablet 2   No current facility-administered medications on file prior to visit.   ROS as in subjective    Objective: BP 130/80    Pulse 70    Temp 97.6 F (36.4 C)    Wt 142 lb 6.4 oz (64.6 kg)    SpO2 99%    BMI 25.23 kg/m   Gen: wd, wn ,nad Heart: rrr, normal s1, s2, no murmurs Lungs clear Alert x 1, no distress, UE and LE 4-5/5  strength but some trouble following commands Seems to respond to commands appropriately from daughter Ext: no edema Pulses WNL     Assessment: Encounter Diagnoses  Name Primary?   Uncontrolled type 2 diabetes mellitus with hyperglycemia, with long-term current use of insulin (HCC) Yes   Major neurocognitive disorder ([place])    Anemia due to stage 3 chronic kidney disease, unspecified whether stage 3a or 3b CKD ([place])    CKD (chronic kidney disease), stage IV ([place])    Essential hypertension    Hypoglycemia  Weight loss      Plan: High blood pressure For now continue carvedilol 6.25 mg twice daily.  This is what she has been doing at home regularly.  Blood pressure today is okay We discussed some parameters including keeping blood pressure above 115/65.  Keeping blood pressure under 140/90.  Goal is around 120/70. At any time if she feels weak, lightheaded, lethargic, check blood pressure and blood sugar for any signs of either being too low Amlodipine benazepril was discontinued in September by nephrology   Diabetes Avoid hypoglycemia Continue NPH insulin  15 units daily.  Continue Victoza 0.6 mg daily.  Monitor blood sugars fasting in the morning.  Avoid readings under 80.  If readings consistently over 150 fasting in the morning then increase to 17 units daily Follow-up with endocrinology soon Goal is 80 - 150 fasting glucose.     CKD 4, chronic kidney disease I reviewed notes from September 2022 visit.  Follow-up soon with nephrology.  For now continue carvedilol 6.25 mg twice daily.   Weight loss- she had been losing weight a few months ago but that seems stable at this point   Major neurocognitive disorder-follow-up with neurology.  Continue Exelon and Namenda.  She is increasing Namenda to twice daily per neurology soon.    Courtney Goodman was seen today for discuss medication.  Diagnoses and all orders for this visit:  Uncontrolled type 2 diabetes mellitus with  hyperglycemia, with long-term current use of insulin (HCC)  Major neurocognitive disorder ([place])  Anemia due to stage 3 chronic kidney disease, unspecified whether stage 3a or 3b CKD (HCC)  CKD (chronic kidney disease), stage IV ([place])  Essential hypertension  Hypoglycemia  Weight loss   F/u 2 weeks

## 2021-02-19 NOTE — Patient Instructions (Addendum)
°  High blood pressure For now continue carvedilol 6.25 mg twice daily.  This is what she has been doing at home regularly.  Blood pressure today is okay We discussed some parameters including keeping blood pressure above 115/65.  Keeping blood pressure under 140/90.  Goal is around 120/70. At any time if she feels weak, lightheaded, lethargic, check blood pressure and blood sugar for any signs of either being too low Amlodipine benazepril was discontinued in September by nephrology   Diabetes Avoid hypoglycemia Continue NPH insulin  15 units daily.  Continue Victoza 0.6 mg daily.  Monitor blood sugars fasting in the morning.  Avoid readings under 80.  If readings consistently over 150 fasting in the morning then increase to 17 units daily Follow-up with endocrinology soon Goal is 80 - 150 fasting glucose.     CKD 4, chronic kidney disease I reviewed notes from September 2022 visit.  Follow-up soon with nephrology.  For now continue carvedilol 6.25 mg twice daily.   Weight loss- she had been losing weight a few months ago but that seems stable at this point   Major neurocognitive disorder-follow-up with neurology.  Continue Exelon and Namenda.  She is increasing Namenda to twice daily per neurology soon.

## 2021-02-21 ENCOUNTER — Inpatient Hospital Stay: Payer: Medicare HMO

## 2021-02-21 ENCOUNTER — Other Ambulatory Visit: Payer: Self-pay

## 2021-02-21 VITALS — BP 130/86 | HR 73 | Temp 98.1°F | Resp 18

## 2021-02-21 DIAGNOSIS — Z79899 Other long term (current) drug therapy: Secondary | ICD-10-CM | POA: Diagnosis not present

## 2021-02-21 DIAGNOSIS — D631 Anemia in chronic kidney disease: Secondary | ICD-10-CM | POA: Diagnosis not present

## 2021-02-21 DIAGNOSIS — N189 Chronic kidney disease, unspecified: Secondary | ICD-10-CM

## 2021-02-21 DIAGNOSIS — N183 Chronic kidney disease, stage 3 unspecified: Secondary | ICD-10-CM | POA: Diagnosis not present

## 2021-02-21 DIAGNOSIS — I129 Hypertensive chronic kidney disease with stage 1 through stage 4 chronic kidney disease, or unspecified chronic kidney disease: Secondary | ICD-10-CM | POA: Diagnosis not present

## 2021-02-21 LAB — CBC WITH DIFFERENTIAL (CANCER CENTER ONLY)
Abs Immature Granulocytes: 0.07 10*3/uL (ref 0.00–0.07)
Basophils Absolute: 0 10*3/uL (ref 0.0–0.1)
Basophils Relative: 0 %
Eosinophils Absolute: 0.1 10*3/uL (ref 0.0–0.5)
Eosinophils Relative: 1 %
HCT: 29.1 % — ABNORMAL LOW (ref 36.0–46.0)
Hemoglobin: 9.3 g/dL — ABNORMAL LOW (ref 12.0–15.0)
Immature Granulocytes: 1 %
Lymphocytes Relative: 14 %
Lymphs Abs: 1.3 10*3/uL (ref 0.7–4.0)
MCH: 29.8 pg (ref 26.0–34.0)
MCHC: 32 g/dL (ref 30.0–36.0)
MCV: 93.3 fL (ref 80.0–100.0)
Monocytes Absolute: 0.7 10*3/uL (ref 0.1–1.0)
Monocytes Relative: 7 %
Neutro Abs: 7.1 10*3/uL (ref 1.7–7.7)
Neutrophils Relative %: 77 %
Platelet Count: 296 10*3/uL (ref 150–400)
RBC: 3.12 MIL/uL — ABNORMAL LOW (ref 3.87–5.11)
RDW: 15.9 % — ABNORMAL HIGH (ref 11.5–15.5)
WBC Count: 9.2 10*3/uL (ref 4.0–10.5)
nRBC: 0 % (ref 0.0–0.2)

## 2021-02-21 LAB — IRON AND IRON BINDING CAPACITY (CC-WL,HP ONLY)
Iron: 42 ug/dL (ref 28–170)
Saturation Ratios: 13 % (ref 10.4–31.8)
TIBC: 326 ug/dL (ref 250–450)
UIBC: 284 ug/dL (ref 148–442)

## 2021-02-21 LAB — FERRITIN: Ferritin: 442 ng/mL — ABNORMAL HIGH (ref 11–307)

## 2021-02-21 MED ORDER — EPOETIN ALFA-EPBX 40000 UNIT/ML IJ SOLN
40000.0000 [IU] | Freq: Once | INTRAMUSCULAR | Status: AC
  Administered 2021-02-21: 10:00:00 40000 [IU] via SUBCUTANEOUS
  Filled 2021-02-21: qty 1

## 2021-02-21 NOTE — Patient Instructions (Signed)
Epoetin Alfa injection °What is this medication? °EPOETIN ALFA (e POE e tin AL fa) helps your body make more red blood cells. This medicine is used to treat anemia caused by chronic kidney disease, cancer chemotherapy, or HIV-therapy. It may also be used before surgery if you have anemia. °This medicine may be used for other purposes; ask your health care provider or pharmacist if you have questions. °COMMON BRAND NAME(S): Epogen, Procrit, Retacrit °What should I tell my care team before I take this medication? °They need to know if you have any of these conditions: °cancer °heart disease °high blood pressure °history of blood clots °history of stroke °low levels of folate, iron, or vitamin B12 in the blood °seizures °an unusual or allergic reaction to erythropoietin, albumin, benzyl alcohol, hamster proteins, other medicines, foods, dyes, or preservatives °pregnant or trying to get pregnant °breast-feeding °How should I use this medication? °This medicine is for injection into a vein or under the skin. It is usually given by a health care professional in a hospital or clinic setting. °If you get this medicine at home, you will be taught how to prepare and give this medicine. Use exactly as directed. Take your medicine at regular intervals. Do not take your medicine more often than directed. °It is important that you put your used needles and syringes in a special sharps container. Do not put them in a trash can. If you do not have a sharps container, call your pharmacist or healthcare provider to get one. °A special MedGuide will be given to you by the pharmacist with each prescription and refill. Be sure to read this information carefully each time. °Talk to your pediatrician regarding the use of this medicine in children. While this drug may be prescribed for selected conditions, precautions do apply. °Overdosage: If you think you have taken too much of this medicine contact a poison control center or emergency  room at once. °NOTE: This medicine is only for you. Do not share this medicine with others. °What if I miss a dose? °If you miss a dose, take it as soon as you can. If it is almost time for your next dose, take only that dose. Do not take double or extra doses. °What may interact with this medication? °Interactions have not been studied. °This list may not describe all possible interactions. Give your health care provider a list of all the medicines, herbs, non-prescription drugs, or dietary supplements you use. Also tell them if you smoke, drink alcohol, or use illegal drugs. Some items may interact with your medicine. °What should I watch for while using this medication? °Your condition will be monitored carefully while you are receiving this medicine. °You may need blood work done while you are taking this medicine. °This medicine may cause a decrease in vitamin B6. You should make sure that you get enough vitamin B6 while you are taking this medicine. Discuss the foods you eat and the vitamins you take with your health care professional. °What side effects may I notice from receiving this medication? °Side effects that you should report to your doctor or health care professional as soon as possible: °allergic reactions like skin rash, itching or hives, swelling of the face, lips, or tongue °seizures °signs and symptoms of a blood clot such as breathing problems; changes in vision; chest pain; severe, sudden headache; pain, swelling, warmth in the leg; trouble speaking; sudden numbness or weakness of the face, arm or leg °signs and symptoms of a stroke like   changes in vision; confusion; trouble speaking or understanding; severe headaches; sudden numbness or weakness of the face, arm or leg; trouble walking; dizziness; loss of balance or coordination °Side effects that usually do not require medical attention (report to your doctor or health care professional if they continue or are  bothersome): °chills °cough °dizziness °fever °headaches °joint pain °muscle cramps °muscle pain °nausea, vomiting °pain, redness, or irritation at site where injected °This list may not describe all possible side effects. Call your doctor for medical advice about side effects. You may report side effects to FDA at 1-800-FDA-1088. °Where should I keep my medication? °Keep out of the reach of children. °Store in a refrigerator between 2 and 8 degrees C (36 and 46 degrees F). Do not freeze or shake. Throw away any unused portion if using a single-dose vial. Multi-dose vials can be kept in the refrigerator for up to 21 days after the initial dose. Throw away unused medicine. °NOTE: This sheet is a summary. It may not cover all possible information. If you have questions about this medicine, talk to your doctor, pharmacist, or health care provider. °© 2022 [employer]vier/Gold Standard (2016-09-17 00:00:00) ° °

## 2021-02-28 ENCOUNTER — Ambulatory Visit: Payer: Self-pay

## 2021-03-01 ENCOUNTER — Ambulatory Visit: Payer: Medicare HMO | Admitting: Oncology

## 2021-03-01 ENCOUNTER — Other Ambulatory Visit: Payer: Medicare HMO

## 2021-03-05 ENCOUNTER — Other Ambulatory Visit: Payer: Self-pay

## 2021-03-05 NOTE — Patient Outreach (Signed)
[place] [facility]) Care Management  03/05/2021  Courtney Goodman July 15, 1945 [id number]   Telephone Assessment     Unsuccessful outreach attempt to patient.     Plan: Assigned RN CM will make outreach attempt to patient within the month of March.     Enzo Montgomery, RN,BSN,CCM [place] Management Telephonic Care Management Coordinator Direct Phone: [phone] Toll Free: [phone] Fax: [phone]

## 2021-03-07 ENCOUNTER — Ambulatory Visit: Payer: Medicare HMO | Admitting: Internal Medicine

## 2021-03-07 ENCOUNTER — Other Ambulatory Visit: Payer: Medicare HMO

## 2021-03-07 ENCOUNTER — Ambulatory Visit: Payer: Medicare HMO

## 2021-03-07 ENCOUNTER — Ambulatory Visit: Payer: Self-pay

## 2021-03-12 ENCOUNTER — Telehealth: Payer: Self-pay | Admitting: Physician Assistant

## 2021-03-12 ENCOUNTER — Encounter: Payer: Self-pay | Admitting: Physician Assistant

## 2021-03-12 NOTE — Telephone Encounter (Signed)
Guidance for constipation treatment sent to patient in a MyChart message.   Thank you.

## 2021-03-12 NOTE — Telephone Encounter (Signed)
Pt daughter called and states that pt has not had a bowel movement in a week she did not give her anything since she was diabetic and the medicines she was on she was scared to, she would like some advice on it, she did not want to bring her in if she did not have to, she can be reached at [phone]

## 2021-03-12 NOTE — Telephone Encounter (Signed)
Daughter informed and samples Miralax given

## 2021-03-13 ENCOUNTER — Other Ambulatory Visit: Payer: Self-pay

## 2021-03-13 DIAGNOSIS — N183 Chronic kidney disease, stage 3 unspecified: Secondary | ICD-10-CM

## 2021-03-13 DIAGNOSIS — Z862 Personal history of diseases of the blood and blood-forming organs and certain disorders involving the immune mechanism: Secondary | ICD-10-CM

## 2021-03-13 DIAGNOSIS — N189 Chronic kidney disease, unspecified: Secondary | ICD-10-CM

## 2021-03-14 ENCOUNTER — Inpatient Hospital Stay: Payer: Medicare HMO | Attending: Oncology

## 2021-03-14 ENCOUNTER — Inpatient Hospital Stay: Payer: Medicare HMO

## 2021-03-14 ENCOUNTER — Inpatient Hospital Stay: Payer: Medicare HMO | Admitting: Internal Medicine

## 2021-03-21 ENCOUNTER — Telehealth: Payer: Self-pay | Admitting: Medical

## 2021-03-21 NOTE — Telephone Encounter (Signed)
Pt was notified.  

## 2021-03-21 NOTE — Telephone Encounter (Signed)
Tried to call pt but vm not set up

## 2021-03-21 NOTE — Telephone Encounter (Signed)
I reviewed the recent BP and glucose readgins she sent in  Blood pressures are mostly acceptable with a few readings higher and lower.  For now continue the same medicine she is taking, which I believe is carvedilol 6.25 mg twice daily  Blood sugars are running a bit high.  I am assuming those numbers are fasting she turned in.  Continue Victoza daily, continue Novolin N.  Last visit we discussed going up to 17 units daily if sugars are not at goal.  She can increase 2 units/week until morning sugars are consistently between 80 and 130.  We want to avoid low sugar readings.  So if any sugar readings under 70 do not keep increasing insulin without contacting us  Follow-up with endocrinology

## 2021-03-26 DIAGNOSIS — E119 Type 2 diabetes mellitus without complications: Secondary | ICD-10-CM | POA: Diagnosis not present

## 2021-03-26 LAB — HM DIABETES EYE EXAM

## 2021-03-28 DIAGNOSIS — E1122 Type 2 diabetes mellitus with diabetic chronic kidney disease: Secondary | ICD-10-CM | POA: Diagnosis not present

## 2021-03-28 DIAGNOSIS — R413 Other amnesia: Secondary | ICD-10-CM | POA: Diagnosis not present

## 2021-03-28 DIAGNOSIS — N184 Chronic kidney disease, stage 4 (severe): Secondary | ICD-10-CM | POA: Diagnosis not present

## 2021-03-28 DIAGNOSIS — I129 Hypertensive chronic kidney disease with stage 1 through stage 4 chronic kidney disease, or unspecified chronic kidney disease: Secondary | ICD-10-CM | POA: Diagnosis not present

## 2021-03-28 DIAGNOSIS — D649 Anemia, unspecified: Secondary | ICD-10-CM | POA: Diagnosis not present

## 2021-04-04 ENCOUNTER — Inpatient Hospital Stay: Payer: Medicare HMO

## 2021-04-04 ENCOUNTER — Other Ambulatory Visit: Payer: Self-pay

## 2021-04-04 ENCOUNTER — Inpatient Hospital Stay: Payer: Medicare HMO | Attending: Oncology

## 2021-04-04 VITALS — BP 109/68 | HR 75 | Temp 98.4°F | Resp 18

## 2021-04-04 DIAGNOSIS — I129 Hypertensive chronic kidney disease with stage 1 through stage 4 chronic kidney disease, or unspecified chronic kidney disease: Secondary | ICD-10-CM | POA: Diagnosis not present

## 2021-04-04 DIAGNOSIS — D631 Anemia in chronic kidney disease: Secondary | ICD-10-CM

## 2021-04-04 DIAGNOSIS — Z79899 Other long term (current) drug therapy: Secondary | ICD-10-CM | POA: Insufficient documentation

## 2021-04-04 DIAGNOSIS — N183 Chronic kidney disease, stage 3 unspecified: Secondary | ICD-10-CM | POA: Diagnosis not present

## 2021-04-04 LAB — CMP (CANCER CENTER ONLY)
ALT: 13 U/L (ref 0–44)
AST: 19 U/L (ref 15–41)
Albumin: 3.8 g/dL (ref 3.5–5.0)
Alkaline Phosphatase: 44 U/L (ref 38–126)
Anion gap: 6 (ref 5–15)
BUN: 49 mg/dL — ABNORMAL HIGH (ref 8–23)
CO2: 26 mmol/L (ref 22–32)
Calcium: 10.4 mg/dL — ABNORMAL HIGH (ref 8.9–10.3)
Chloride: 104 mmol/L (ref 98–111)
Creatinine: 2.48 mg/dL — ABNORMAL HIGH (ref 0.44–1.00)
GFR, Estimated: 20 mL/min — ABNORMAL LOW (ref >60)
Glucose, Bld: 252 mg/dL — ABNORMAL HIGH (ref 70–99)
Potassium: 4.3 mmol/L (ref 3.5–5.1)
Sodium: 136 mmol/L (ref 135–145)
Total Bilirubin: 0.5 mg/dL (ref 0.3–1.2)
Total Protein: 7.1 g/dL (ref 6.5–8.1)

## 2021-04-04 LAB — CBC WITH DIFFERENTIAL (CANCER CENTER ONLY)
Abs Immature Granulocytes: 0.05 10*3/uL (ref 0.00–0.07)
Basophils Absolute: 0 10*3/uL (ref 0.0–0.1)
Basophils Relative: 0 %
Eosinophils Absolute: 0.1 10*3/uL (ref 0.0–0.5)
Eosinophils Relative: 1 %
HCT: 31.7 % — ABNORMAL LOW (ref 36.0–46.0)
Hemoglobin: 10.5 g/dL — ABNORMAL LOW (ref 12.0–15.0)
Immature Granulocytes: 1 %
Lymphocytes Relative: 23 %
Lymphs Abs: 2.1 10*3/uL (ref 0.7–4.0)
MCH: 30.7 pg (ref 26.0–34.0)
MCHC: 33.1 g/dL (ref 30.0–36.0)
MCV: 92.7 fL (ref 80.0–100.0)
Monocytes Absolute: 0.5 10*3/uL (ref 0.1–1.0)
Monocytes Relative: 6 %
Neutro Abs: 6.3 10*3/uL (ref 1.7–7.7)
Neutrophils Relative %: 69 %
Platelet Count: 299 10*3/uL (ref 150–400)
RBC: 3.42 MIL/uL — ABNORMAL LOW (ref 3.87–5.11)
RDW: 15.9 % — ABNORMAL HIGH (ref 11.5–15.5)
WBC Count: 9.1 10*3/uL (ref 4.0–10.5)
nRBC: 0 % (ref 0.0–0.2)

## 2021-04-04 MED ORDER — EPOETIN ALFA-EPBX 40000 UNIT/ML IJ SOLN
40000.0000 [IU] | Freq: Once | INTRAMUSCULAR | Status: AC
  Administered 2021-04-04: 40000 [IU] via SUBCUTANEOUS
  Filled 2021-04-04: qty 1

## 2021-04-12 ENCOUNTER — Other Ambulatory Visit: Payer: Self-pay | Admitting: Medical

## 2021-04-12 ENCOUNTER — Other Ambulatory Visit: Payer: Self-pay

## 2021-04-12 MED ORDER — FENOFIBRATE 145 MG PO TABS: 145.0000 mg | ORAL_TABLET | Freq: Every day | ORAL | 2 refills | Status: DC

## 2021-04-16 ENCOUNTER — Other Ambulatory Visit: Payer: Self-pay

## 2021-04-16 NOTE — Patient Outreach (Signed)
[facility] [facility]) Care Management ? ?04/16/2021 ? ?Courtney Goodman ?Jul 16, 1945 ?[id number] ? ? ?Telephone call to daughter [name].  She reports that patient is doing well since moving with her.  No low blood sugars to report.  Daughter reports that PCS has been discontinued presently and she and her husband are managing patient care now.  Discussed diabetes management.  No concerns.   ? ?Care Plan : Diabetes Type 2 (Adult)  ?Updates made by [name], RN since 04/16/2021 12:00 AM  ?Completed 04/16/2021  ? ?Problem: Disease Progression (Diabetes, Type 2) Resolved 04/16/2021  ?  ? ?Problem: Glycemic Management (Diabetes, Type 1) Resolved 04/16/2021  ?  ? ?Care Plan : RN Care Manager Plan of Care  ?Updates made by [name], RN since 04/16/2021 12:00 AM  ?  ? ?Problem: Chronic Disease Management and Care Coordination (Diabetes)   ?Priority: High  ?  ? ?Long-Range Goal: Development of Plan of Care for management of diabetes   ?Start Date: 11/29/2020  ?Expected End Date: 01/27/2022  ?This Visit's Progress: On track  ?Priority: High  ?Note:   ?Current Barriers:  ?Chronic Disease Management support and education needs related to DMII  ?Unable to independently self manage diabetes ? ?RNCM Clinical Goal(s):  ?Patient will verbalize understanding of plan for management of DMII ?verbalize basic understanding of  DMII disease process and self health management plan for diabetes ?take all medications exactly as prescribed and will call provider for medication related questions ?continue to work with RN Care Manager to address care management and care coordination needs related to  DMII through collaboration with RN Care manager, provider, and care team.  ? ?Interventions: ?"Provided education to patient re: diabetes, diabetes diet, complications of uncontrolled blood sugar ?Inter-disciplinary care team collaboration (see longitudinal plan of care) ?Evaluation of current treatment plan related to  self management and  patient's adherence to plan as established by provider ? ? ?Diabetes Interventions: ?Assessed patient's understanding of A1c goal: <8% ?Provided education to patient about basic DM disease process; ?Discussed plans with patient for ongoing care management follow up and provided patient with direct contact information for care management team; ?Diabetes Management Discussed ?Medication adherence, ?Reviewed importance of limiting carbs such as rice, potatoes, breads, and pastas. Also discussed limiting sweets and sugary drinks.  Discussed importance of portion control.  Also discussed importance of annual exams, foot exams, and eye exams.   ?Lab Results  ?Component Value Date  ? HGBA1C 10.6 (A) 09/12/2020  ? ?Patient Goals/Self-Care Activities: Diabetes ?check blood sugar at prescribed times: twice daily ?check feet daily for cuts, sores or redness ?enter blood sugar readings and medication or insulin into daily log ?take the blood sugar log to all doctor visits ?manage portion size ? ? ?04/16/21 Patient now living with her daughter and her husband.  Patient requires total care for all aspects of care.  Daughter gives and manages medications.  Blood sugars less than 200.  No lows reported.   ? ? ?Follow Up Plan:  Telephone follow up appointment with care management team member scheduled for:  May ?The patient has been provided with contact information for the care management team and has been advised to call with any health related questions or concerns.  ? ?  ? ?Plan: Follow-up: Patient agrees to Care Plan and Follow-up. ?Follow-up in May.  ? ?Jone Baseman, RN, MSN ?[facility] Care Management ?Care Management Coordinator ?Direct Line [phone] ?Toll Free: [phone]  ?Fax: [phone] ? ?

## 2021-04-16 NOTE — Patient Instructions (Signed)
Patient Goals/Self-Care Activities: Diabetes ?check blood sugar at prescribed times: twice daily ?check feet daily for cuts, sores or redness ?enter blood sugar readings and medication or insulin into daily log ?take the blood sugar log to all doctor visits ?manage portion size ?

## 2021-04-20 ENCOUNTER — Other Ambulatory Visit: Payer: Self-pay | Admitting: Physician Assistant

## 2021-04-20 ENCOUNTER — Other Ambulatory Visit: Payer: Self-pay | Admitting: Family Medicine

## 2021-04-20 DIAGNOSIS — E1169 Type 2 diabetes mellitus with other specified complication: Secondary | ICD-10-CM

## 2021-04-20 MED ORDER — SIMVASTATIN 20 MG PO TABS: 20.0000 mg | ORAL_TABLET | Freq: Every day | ORAL | 1 refills | Status: AC

## 2021-04-23 ENCOUNTER — Other Ambulatory Visit: Payer: Self-pay

## 2021-04-23 DIAGNOSIS — D649 Anemia, unspecified: Secondary | ICD-10-CM

## 2021-04-25 ENCOUNTER — Inpatient Hospital Stay: Payer: Medicare HMO

## 2021-04-27 ENCOUNTER — Encounter: Payer: Self-pay | Admitting: Podiatry

## 2021-04-27 ENCOUNTER — Ambulatory Visit (INDEPENDENT_AMBULATORY_CARE_PROVIDER_SITE_OTHER): Payer: Medicare HMO | Admitting: Podiatry

## 2021-04-27 DIAGNOSIS — E1122 Type 2 diabetes mellitus with diabetic chronic kidney disease: Secondary | ICD-10-CM | POA: Diagnosis not present

## 2021-04-27 DIAGNOSIS — B351 Tinea unguium: Secondary | ICD-10-CM | POA: Diagnosis not present

## 2021-04-27 DIAGNOSIS — L84 Corns and callosities: Secondary | ICD-10-CM | POA: Diagnosis not present

## 2021-04-27 DIAGNOSIS — N183 Chronic kidney disease, stage 3 unspecified: Secondary | ICD-10-CM | POA: Diagnosis not present

## 2021-04-27 DIAGNOSIS — Z794 Long term (current) use of insulin: Secondary | ICD-10-CM | POA: Diagnosis not present

## 2021-04-27 DIAGNOSIS — M79609 Pain in unspecified limb: Secondary | ICD-10-CM

## 2021-04-27 NOTE — Progress Notes (Signed)
?  Subjective:  ?Patient ID: Courtney Goodman, female    DOB: 20-Aug-1945,  MRN: [id number] ? ?Courtney Goodman presents to clinic today for at risk foot care. Pt has h/o NIDDM with chronic kidney disease and callus(es) left foot and painful thick toenails that are difficult to trim. Painful toenails interfere with ambulation. Aggravating factors include wearing enclosed shoe gear. Pain is relieved with periodic professional debridement. Painful calluses are aggravated when weightbearing with and without shoegear. Pain is relieved with periodic professional debridement. ? ?Her daughter is present during today's visit. ? ?Patient did not check blood glucose today. ? ?New problem(s): None.  ? ?PCP is [name] , and last visit was January, 2023. ? ?No Known Allergies ? ?Review of Systems: Negative except as noted in the HPI. ? ?Objective: No changes noted in today's physical examination. ?Constitutional Patient is a pleasant 76 y.o. African American female in NAD. AAO x 3.  ?Vascular Neurovascular status unchanged b/l lower extremities. Capillary fill time to digits <3 seconds b/l lower extremities. Faintly palpable pedal pulses b/l. Pedal hair absent. Lower extremity skin temperature gradient within normal limits. No pain with calf compression b/l. No edema noted b/l lower extremities. No cyanosis or clubbing noted.  ?Neurologic Normal speech. Protective sensation intact 5/5 intact bilaterally with 10g monofilament b/l.  ?Dermatologic Pedal skin with normal turgor, texture and tone bilaterally. No open wounds bilaterally. No interdigital macerations bilaterally. Toenails 1-5 b/l elongated, discolored, dystrophic, thickened, crumbly with subungual debris and tenderness to dorsal palpation. Hyperkeratotic lesion(s) submet head 5 left foot.  No erythema, no edema, no drainage, no fluctuance.  ?Orthopedic: Normal muscle strength 5/5 to all lower extremity muscle groups bilaterally. No pain crepitus or joint  limitation noted with ROM b/l. Plantarflexed metatarsal(s) 5th metatarsal head b/l lower extremities.  ? ? ?  Latest Ref Rng & Units 02/03/2021  ? 11:00 PM 09/12/2020  ?  1:29 PM 06/20/2020  ?  2:03 PM  ?Hemoglobin A1C  ?Hemoglobin-A1c 4.8 - 5.6 % 9.3   10.6   8.9    ? ?Assessment/Plan: ?1. Pain due to onychomycosis of nail   ?2. Callus   ?3. Type 2 diabetes mellitus with stage 3 chronic kidney disease, with long-term current use of insulin, unspecified whether stage 3a or 3b CKD ([place])   ?-Patient was evaluated and treated. All patient's and/or POA's questions/concerns answered on today's visit. ?-Mycotic toenails 1-5 bilaterally were debrided in length and girth with sterile nail nippers and dremel without incident. ?-Callus(es) submet head 5 left foot pared utilizing sterile scalpel blade without complication or incident. Total number debrided =1. ?-Patient/POA to call should there be question/concern in the interim.  ? ?Return in about 3 months (around 07/27/2021). ? ?Marzetta Board, DPM  ?

## 2021-05-10 ENCOUNTER — Other Ambulatory Visit: Payer: Self-pay | Admitting: Medical

## 2021-05-14 ENCOUNTER — Ambulatory Visit: Payer: Medicare HMO | Admitting: Physician Assistant

## 2021-05-16 ENCOUNTER — Other Ambulatory Visit: Payer: Self-pay

## 2021-05-16 ENCOUNTER — Inpatient Hospital Stay: Payer: Medicare HMO

## 2021-05-16 ENCOUNTER — Inpatient Hospital Stay: Payer: Medicare HMO | Attending: Oncology

## 2021-05-16 DIAGNOSIS — N183 Chronic kidney disease, stage 3 unspecified: Secondary | ICD-10-CM | POA: Diagnosis not present

## 2021-05-16 DIAGNOSIS — D631 Anemia in chronic kidney disease: Secondary | ICD-10-CM | POA: Insufficient documentation

## 2021-05-16 DIAGNOSIS — I129 Hypertensive chronic kidney disease with stage 1 through stage 4 chronic kidney disease, or unspecified chronic kidney disease: Secondary | ICD-10-CM | POA: Insufficient documentation

## 2021-05-16 DIAGNOSIS — D649 Anemia, unspecified: Secondary | ICD-10-CM

## 2021-05-16 LAB — CMP (CANCER CENTER ONLY)
ALT: 9 U/L (ref 0–44)
AST: 17 U/L (ref 15–41)
Albumin: 3.7 g/dL (ref 3.5–5.0)
Alkaline Phosphatase: 52 U/L (ref 38–126)
Anion gap: 8 (ref 5–15)
BUN: 38 mg/dL — ABNORMAL HIGH (ref 8–23)
CO2: 28 mmol/L (ref 22–32)
Calcium: 10.5 mg/dL — ABNORMAL HIGH (ref 8.9–10.3)
Chloride: 102 mmol/L (ref 98–111)
Creatinine: 2.43 mg/dL — ABNORMAL HIGH (ref 0.44–1.00)
GFR, Estimated: 20 mL/min — ABNORMAL LOW (ref >60)
Glucose, Bld: 280 mg/dL — ABNORMAL HIGH (ref 70–99)
Potassium: 4.1 mmol/L (ref 3.5–5.1)
Sodium: 138 mmol/L (ref 135–145)
Total Bilirubin: 0.4 mg/dL (ref 0.3–1.2)
Total Protein: 7.7 g/dL (ref 6.5–8.1)

## 2021-05-16 LAB — CBC WITH DIFFERENTIAL (CANCER CENTER ONLY)
Abs Immature Granulocytes: 0.03 10*3/uL (ref 0.00–0.07)
Basophils Absolute: 0 10*3/uL (ref 0.0–0.1)
Basophils Relative: 0 %
Eosinophils Absolute: 0.1 10*3/uL (ref 0.0–0.5)
Eosinophils Relative: 1 %
HCT: 34.6 % — ABNORMAL LOW (ref 36.0–46.0)
Hemoglobin: 11.5 g/dL — ABNORMAL LOW (ref 12.0–15.0)
Immature Granulocytes: 0 %
Lymphocytes Relative: 27 %
Lymphs Abs: 1.8 10*3/uL (ref 0.7–4.0)
MCH: 30.5 pg (ref 26.0–34.0)
MCHC: 33.2 g/dL (ref 30.0–36.0)
MCV: 91.8 fL (ref 80.0–100.0)
Monocytes Absolute: 0.4 10*3/uL (ref 0.1–1.0)
Monocytes Relative: 7 %
Neutro Abs: 4.4 10*3/uL (ref 1.7–7.7)
Neutrophils Relative %: 65 %
Platelet Count: 309 10*3/uL (ref 150–400)
RBC: 3.77 MIL/uL — ABNORMAL LOW (ref 3.87–5.11)
RDW: 15.1 % (ref 11.5–15.5)
WBC Count: 6.7 10*3/uL (ref 4.0–10.5)
nRBC: 0 % (ref 0.0–0.2)

## 2021-05-16 NOTE — Progress Notes (Signed)
Pt here for retacrit injection today. Hgb 11.5. Pt will not receive injection due to parameters. Pt received copy of lab work.  ?

## 2021-05-31 ENCOUNTER — Telehealth: Payer: Self-pay

## 2021-05-31 ENCOUNTER — Other Ambulatory Visit: Payer: Self-pay

## 2021-05-31 ENCOUNTER — Encounter (HOSPITAL_COMMUNITY): Payer: Self-pay

## 2021-05-31 ENCOUNTER — Inpatient Hospital Stay (HOSPITAL_COMMUNITY): Payer: Medicare HMO

## 2021-05-31 ENCOUNTER — Emergency Department (HOSPITAL_COMMUNITY): Payer: Medicare HMO

## 2021-05-31 ENCOUNTER — Inpatient Hospital Stay (HOSPITAL_COMMUNITY)
Admission: EM | Admit: 2021-05-31 | Discharge: 2021-06-06 | DRG: 871 | Disposition: A | Discharge status: Home Health | Payer: Medicare HMO | Attending: Family Medicine | Admitting: Family Medicine

## 2021-05-31 DIAGNOSIS — I129 Hypertensive chronic kidney disease with stage 1 through stage 4 chronic kidney disease, or unspecified chronic kidney disease: Secondary | ICD-10-CM | POA: Diagnosis present

## 2021-05-31 DIAGNOSIS — Z79899 Other long term (current) drug therapy: Secondary | ICD-10-CM

## 2021-05-31 DIAGNOSIS — J9811 Atelectasis: Secondary | ICD-10-CM | POA: Diagnosis not present

## 2021-05-31 DIAGNOSIS — D631 Anemia in chronic kidney disease: Secondary | ICD-10-CM | POA: Diagnosis present

## 2021-05-31 DIAGNOSIS — R4182 Altered mental status, unspecified: Secondary | ICD-10-CM

## 2021-05-31 DIAGNOSIS — R6521 Severe sepsis with septic shock: Secondary | ICD-10-CM | POA: Diagnosis not present

## 2021-05-31 DIAGNOSIS — E871 Hypo-osmolality and hyponatremia: Secondary | ICD-10-CM | POA: Diagnosis present

## 2021-05-31 DIAGNOSIS — R935 Abnormal findings on diagnostic imaging of other abdominal regions, including retroperitoneum: Secondary | ICD-10-CM | POA: Diagnosis not present

## 2021-05-31 DIAGNOSIS — Z20822 Contact with and (suspected) exposure to covid-19: Secondary | ICD-10-CM | POA: Diagnosis not present

## 2021-05-31 DIAGNOSIS — Z794 Long term (current) use of insulin: Secondary | ICD-10-CM

## 2021-05-31 DIAGNOSIS — F0394 Unspecified dementia, unspecified severity, with anxiety: Secondary | ICD-10-CM | POA: Diagnosis not present

## 2021-05-31 DIAGNOSIS — A4159 Other Gram-negative sepsis: Secondary | ICD-10-CM | POA: Diagnosis not present

## 2021-05-31 DIAGNOSIS — Z833 Family history of diabetes mellitus: Secondary | ICD-10-CM

## 2021-05-31 DIAGNOSIS — E559 Vitamin D deficiency, unspecified: Secondary | ICD-10-CM | POA: Diagnosis present

## 2021-05-31 DIAGNOSIS — E43 Unspecified severe protein-calorie malnutrition: Secondary | ICD-10-CM | POA: Diagnosis present

## 2021-05-31 DIAGNOSIS — C259 Malignant neoplasm of pancreas, unspecified: Secondary | ICD-10-CM | POA: Diagnosis not present

## 2021-05-31 DIAGNOSIS — K863 Pseudocyst of pancreas: Secondary | ICD-10-CM | POA: Diagnosis present

## 2021-05-31 DIAGNOSIS — R600 Localized edema: Secondary | ICD-10-CM | POA: Diagnosis not present

## 2021-05-31 DIAGNOSIS — R579 Shock, unspecified: Secondary | ICD-10-CM

## 2021-05-31 DIAGNOSIS — I4510 Unspecified right bundle-branch block: Secondary | ICD-10-CM | POA: Diagnosis present

## 2021-05-31 DIAGNOSIS — N179 Acute kidney failure, unspecified: Secondary | ICD-10-CM | POA: Diagnosis not present

## 2021-05-31 DIAGNOSIS — R627 Adult failure to thrive: Secondary | ICD-10-CM | POA: Diagnosis present

## 2021-05-31 DIAGNOSIS — E872 Acidosis, unspecified: Secondary | ICD-10-CM | POA: Diagnosis not present

## 2021-05-31 DIAGNOSIS — K8689 Other specified diseases of pancreas: Secondary | ICD-10-CM | POA: Diagnosis not present

## 2021-05-31 DIAGNOSIS — K859 Acute pancreatitis without necrosis or infection, unspecified: Secondary | ICD-10-CM | POA: Diagnosis present

## 2021-05-31 DIAGNOSIS — Z7982 Long term (current) use of aspirin: Secondary | ICD-10-CM | POA: Diagnosis not present

## 2021-05-31 DIAGNOSIS — K862 Cyst of pancreas: Secondary | ICD-10-CM | POA: Diagnosis not present

## 2021-05-31 DIAGNOSIS — N184 Chronic kidney disease, stage 4 (severe): Secondary | ICD-10-CM | POA: Diagnosis present

## 2021-05-31 DIAGNOSIS — A419 Sepsis, unspecified organism: Secondary | ICD-10-CM

## 2021-05-31 DIAGNOSIS — I959 Hypotension, unspecified: Secondary | ICD-10-CM | POA: Diagnosis not present

## 2021-05-31 DIAGNOSIS — F411 Generalized anxiety disorder: Secondary | ICD-10-CM | POA: Diagnosis present

## 2021-05-31 DIAGNOSIS — I10 Essential (primary) hypertension: Secondary | ICD-10-CM | POA: Diagnosis not present

## 2021-05-31 DIAGNOSIS — Z90710 Acquired absence of both cervix and uterus: Secondary | ICD-10-CM

## 2021-05-31 DIAGNOSIS — R1310 Dysphagia, unspecified: Secondary | ICD-10-CM | POA: Diagnosis present

## 2021-05-31 DIAGNOSIS — E782 Mixed hyperlipidemia: Secondary | ICD-10-CM | POA: Diagnosis present

## 2021-05-31 DIAGNOSIS — Z6822 Body mass index (BMI) 22.0-22.9, adult: Secondary | ICD-10-CM

## 2021-05-31 DIAGNOSIS — Z8673 Personal history of transient ischemic attack (TIA), and cerebral infarction without residual deficits: Secondary | ICD-10-CM

## 2021-05-31 DIAGNOSIS — E1165 Type 2 diabetes mellitus with hyperglycemia: Secondary | ICD-10-CM | POA: Diagnosis not present

## 2021-05-31 DIAGNOSIS — R0902 Hypoxemia: Secondary | ICD-10-CM | POA: Diagnosis not present

## 2021-05-31 DIAGNOSIS — R404 Transient alteration of awareness: Secondary | ICD-10-CM | POA: Diagnosis not present

## 2021-05-31 DIAGNOSIS — E1122 Type 2 diabetes mellitus with diabetic chronic kidney disease: Secondary | ICD-10-CM | POA: Diagnosis present

## 2021-05-31 DIAGNOSIS — G9341 Metabolic encephalopathy: Secondary | ICD-10-CM | POA: Diagnosis present

## 2021-05-31 DIAGNOSIS — G928 Other toxic encephalopathy: Secondary | ICD-10-CM | POA: Diagnosis not present

## 2021-05-31 DIAGNOSIS — Z87891 Personal history of nicotine dependence: Secondary | ICD-10-CM

## 2021-05-31 DIAGNOSIS — R0602 Shortness of breath: Secondary | ICD-10-CM | POA: Diagnosis not present

## 2021-05-31 DIAGNOSIS — E1169 Type 2 diabetes mellitus with other specified complication: Secondary | ICD-10-CM | POA: Diagnosis present

## 2021-05-31 DIAGNOSIS — J449 Chronic obstructive pulmonary disease, unspecified: Secondary | ICD-10-CM | POA: Diagnosis present

## 2021-05-31 DIAGNOSIS — A414 Sepsis due to anaerobes: Secondary | ICD-10-CM | POA: Diagnosis not present

## 2021-05-31 DIAGNOSIS — L89156 Pressure-induced deep tissue damage of sacral region: Secondary | ICD-10-CM | POA: Diagnosis present

## 2021-05-31 DIAGNOSIS — E876 Hypokalemia: Secondary | ICD-10-CM | POA: Diagnosis not present

## 2021-05-31 DIAGNOSIS — I700 Atherosclerosis of aorta: Secondary | ICD-10-CM | POA: Diagnosis not present

## 2021-05-31 DIAGNOSIS — R739 Hyperglycemia, unspecified: Secondary | ICD-10-CM | POA: Diagnosis not present

## 2021-05-31 DIAGNOSIS — D65 Disseminated intravascular coagulation [defibrination syndrome]: Secondary | ICD-10-CM | POA: Diagnosis not present

## 2021-05-31 DIAGNOSIS — I2510 Atherosclerotic heart disease of native coronary artery without angina pectoris: Secondary | ICD-10-CM | POA: Diagnosis present

## 2021-05-31 DIAGNOSIS — K4090 Unilateral inguinal hernia, without obstruction or gangrene, not specified as recurrent: Secondary | ICD-10-CM | POA: Diagnosis not present

## 2021-05-31 DIAGNOSIS — Z9861 Coronary angioplasty status: Secondary | ICD-10-CM

## 2021-05-31 LAB — TYPE AND SCREEN
ABO/RH(D): O POS
Antibody Screen: NEGATIVE

## 2021-05-31 LAB — I-STAT VENOUS BLOOD GAS, ED
Acid-base deficit: 8 mmol/L — ABNORMAL HIGH (ref 0.0–2.0)
Acid-base deficit: 9 mmol/L — ABNORMAL HIGH (ref 0.0–2.0)
Bicarbonate: 17.3 mmol/L — ABNORMAL LOW (ref 20.0–28.0)
Bicarbonate: 17.5 mmol/L — ABNORMAL LOW (ref 20.0–28.0)
Calcium, Ion: 1.09 mmol/L — ABNORMAL LOW (ref 1.15–1.40)
Calcium, Ion: 1.09 mmol/L — ABNORMAL LOW (ref 1.15–1.40)
HCT: 29 % — ABNORMAL LOW (ref 36.0–46.0)
HCT: 29 % — ABNORMAL LOW (ref 36.0–46.0)
Hemoglobin: 9.9 g/dL — ABNORMAL LOW (ref 12.0–15.0)
Hemoglobin: 9.9 g/dL — ABNORMAL LOW (ref 12.0–15.0)
O2 Saturation: 98 %
O2 Saturation: 99 %
Potassium: 5 mmol/L (ref 3.5–5.1)
Potassium: 5.1 mmol/L (ref 3.5–5.1)
Sodium: 128 mmol/L — ABNORMAL LOW (ref 135–145)
Sodium: 128 mmol/L — ABNORMAL LOW (ref 135–145)
TCO2: 18 mmol/L — ABNORMAL LOW (ref 22–32)
TCO2: 19 mmol/L — ABNORMAL LOW (ref 22–32)
pCO2, Ven: 34 mmHg — ABNORMAL LOW (ref 44–60)
pCO2, Ven: 38.1 mmHg — ABNORMAL LOW (ref 44–60)
pH, Ven: 7.314 (ref 7.25–7.43)
pH, Ven: 7.271 (ref 7.25–7.43)
pO2, Ven: 112 mmHg — ABNORMAL HIGH (ref 32–45)
pO2, Ven: 149 mmHg — ABNORMAL HIGH (ref 32–45)

## 2021-05-31 LAB — URINALYSIS, ROUTINE W REFLEX MICROSCOPIC
Bilirubin Urine: NEGATIVE
Glucose, UA: >=500 mg/dL — AB
Ketones, ur: NEGATIVE mg/dL
Leukocytes,Ua: NEGATIVE
Nitrite: NEGATIVE
Protein, ur: 30 mg/dL — AB
Specific Gravity, Urine: 1.017 (ref 1.005–1.030)
pH: 5 (ref 5.0–8.0)

## 2021-05-31 LAB — COMPREHENSIVE METABOLIC PANEL
ALT: 26 U/L (ref 0–44)
AST: 69 U/L — ABNORMAL HIGH (ref 15–41)
Albumin: 2.4 g/dL — ABNORMAL LOW (ref 3.5–5.0)
Alkaline Phosphatase: 89 U/L (ref 38–126)
Anion gap: 18 — ABNORMAL HIGH (ref 5–15)
BUN: 63 mg/dL — ABNORMAL HIGH (ref 8–23)
CO2: 16 mmol/L — ABNORMAL LOW (ref 22–32)
Calcium: 9 mg/dL (ref 8.9–10.3)
Chloride: 95 mmol/L — ABNORMAL LOW (ref 98–111)
Creatinine, Ser: 4.26 mg/dL — ABNORMAL HIGH (ref 0.44–1.00)
GFR, Estimated: 10 mL/min — ABNORMAL LOW (ref >60)
Glucose, Bld: 357 mg/dL — ABNORMAL HIGH (ref 70–99)
Potassium: 5.1 mmol/L (ref 3.5–5.1)
Sodium: 129 mmol/L — ABNORMAL LOW (ref 135–145)
Total Bilirubin: 0.9 mg/dL (ref 0.3–1.2)
Total Protein: 6.3 g/dL — ABNORMAL LOW (ref 6.5–8.1)

## 2021-05-31 LAB — RAPID URINE DRUG SCREEN, HOSP PERFORMED
Amphetamines: NOT DETECTED
Barbiturates: NOT DETECTED
Benzodiazepines: NOT DETECTED
Cocaine: NOT DETECTED
Opiates: NOT DETECTED
Tetrahydrocannabinol: NOT DETECTED

## 2021-05-31 LAB — CBC WITH DIFFERENTIAL/PLATELET
Abs Immature Granulocytes: 0.4 10*3/uL — ABNORMAL HIGH (ref 0.00–0.07)
Basophils Absolute: 0 10*3/uL (ref 0.0–0.1)
Basophils Relative: 0 %
Eosinophils Absolute: 0 10*3/uL (ref 0.0–0.5)
Eosinophils Relative: 0 %
HCT: 29 % — ABNORMAL LOW (ref 36.0–46.0)
Hemoglobin: 9.4 g/dL — ABNORMAL LOW (ref 12.0–15.0)
Lymphocytes Relative: 1 %
Lymphs Abs: 0.4 10*3/uL — ABNORMAL LOW (ref 0.7–4.0)
MCH: 30.6 pg (ref 26.0–34.0)
MCHC: 32.4 g/dL (ref 30.0–36.0)
MCV: 94.5 fL (ref 80.0–100.0)
Monocytes Absolute: 1.4 10*3/uL — ABNORMAL HIGH (ref 0.1–1.0)
Monocytes Relative: 4 %
Myelocytes: 1 %
Neutro Abs: 33.5 10*3/uL — ABNORMAL HIGH (ref 1.7–7.7)
Neutrophils Relative %: 94 %
Platelets: 258 10*3/uL (ref 150–400)
RBC: 3.07 MIL/uL — ABNORMAL LOW (ref 3.87–5.11)
RDW: 15.9 % — ABNORMAL HIGH (ref 11.5–15.5)
WBC: 35.6 10*3/uL — ABNORMAL HIGH (ref 4.0–10.5)
nRBC: 0.1 % (ref 0.0–0.2)
nRBC: 1 /100 WBC — ABNORMAL HIGH

## 2021-05-31 LAB — GLUCOSE, CAPILLARY
Glucose-Capillary: 385 mg/dL — ABNORMAL HIGH (ref 70–99)
Glucose-Capillary: 300 mg/dL — ABNORMAL HIGH (ref 70–99)
Glucose-Capillary: 228 mg/dL — ABNORMAL HIGH (ref 70–99)
Glucose-Capillary: 172 mg/dL — ABNORMAL HIGH (ref 70–99)
Glucose-Capillary: 158 mg/dL — ABNORMAL HIGH (ref 70–99)
Glucose-Capillary: 152 mg/dL — ABNORMAL HIGH (ref 70–99)
Glucose-Capillary: 185 mg/dL — ABNORMAL HIGH (ref 70–99)

## 2021-05-31 LAB — BASIC METABOLIC PANEL
Anion gap: 17 — ABNORMAL HIGH (ref 5–15)
BUN: 66 mg/dL — ABNORMAL HIGH (ref 8–23)
CO2: 17 mmol/L — ABNORMAL LOW (ref 22–32)
Calcium: 8.6 mg/dL — ABNORMAL LOW (ref 8.9–10.3)
Chloride: 100 mmol/L (ref 98–111)
Creatinine, Ser: 4.04 mg/dL — ABNORMAL HIGH (ref 0.44–1.00)
GFR, Estimated: 11 mL/min — ABNORMAL LOW (ref >60)
Glucose, Bld: 161 mg/dL — ABNORMAL HIGH (ref 70–99)
Potassium: 3.9 mmol/L (ref 3.5–5.1)
Sodium: 134 mmol/L — ABNORMAL LOW (ref 135–145)

## 2021-05-31 LAB — LACTIC ACID, PLASMA: Lactic Acid, Venous: 4.1 mmol/L (ref 0.5–1.9)

## 2021-05-31 LAB — PROCALCITONIN: Procalcitonin: 121.58 ng/mL

## 2021-05-31 LAB — TROPONIN I (HIGH SENSITIVITY)
Troponin I (High Sensitivity): 36 ng/L — ABNORMAL HIGH (ref <18)
Troponin I (High Sensitivity): 25 ng/L — ABNORMAL HIGH (ref <18)

## 2021-05-31 LAB — CBG MONITORING, ED: Glucose-Capillary: 329 mg/dL — ABNORMAL HIGH (ref 70–99)

## 2021-05-31 LAB — LIPASE, BLOOD: Lipase: 40 U/L (ref 11–51)

## 2021-05-31 LAB — MRSA NEXT GEN BY PCR, NASAL: MRSA by PCR Next Gen: NOT DETECTED

## 2021-05-31 LAB — BETA-HYDROXYBUTYRIC ACID: Beta-Hydroxybutyric Acid: 0.46 mmol/L — ABNORMAL HIGH (ref 0.05–0.27)

## 2021-05-31 LAB — AMYLASE: Amylase: 25 U/L — ABNORMAL LOW (ref 28–100)

## 2021-05-31 LAB — ABO/RH: ABO/RH(D): O POS

## 2021-05-31 MED ORDER — LACTATED RINGERS IV SOLN: INTRAVENOUS | Status: DC

## 2021-05-31 MED ORDER — DOCUSATE SODIUM 100 MG PO CAPS: 100.0000 mg | ORAL_CAPSULE | Freq: Two times a day (BID) | ORAL | Status: DC | PRN

## 2021-05-31 MED ORDER — NOREPINEPHRINE 4 MG/250ML-% IV SOLN
0.0000 ug/min | INTRAVENOUS | Status: DC
  Administered 2021-05-31: 2 ug/min via INTRAVENOUS
  Filled 2021-05-31: qty 250

## 2021-05-31 MED ORDER — VANCOMYCIN VARIABLE DOSE PER UNSTABLE RENAL FUNCTION (PHARMACIST DOSING)
Status: DC
Start: 2021-05-31 — End: 2021-06-01

## 2021-05-31 MED ORDER — LACTATED RINGERS IV BOLUS
1000.0000 mL | Freq: Once | INTRAVENOUS | Status: AC
  Administered 2021-05-31: 1000 mL via INTRAVENOUS

## 2021-05-31 MED ORDER — VANCOMYCIN HCL IN DEXTROSE 1-5 GM/200ML-% IV SOLN: 1000.0000 mg | Freq: Once | INTRAVENOUS | Status: DC

## 2021-05-31 MED ORDER — SODIUM CHLORIDE 0.9 % IV SOLN
1.0000 g | INTRAVENOUS | Status: DC
  Filled 2021-05-31: qty 10

## 2021-05-31 MED ORDER — INSULIN REGULAR(HUMAN) IN NACL 100-0.9 UT/100ML-% IV SOLN
INTRAVENOUS | Status: DC
  Administered 2021-05-31: 7.5 [IU]/h via INTRAVENOUS
  Filled 2021-05-31: qty 100

## 2021-05-31 MED ORDER — VANCOMYCIN HCL 1500 MG/300ML IV SOLN
1500.0000 mg | Freq: Once | INTRAVENOUS | Status: AC
  Administered 2021-05-31: 1500 mg via INTRAVENOUS
  Filled 2021-05-31 (×3): qty 300

## 2021-05-31 MED ORDER — DEXTROSE IN LACTATED RINGERS 5 % IV SOLN: INTRAVENOUS | Status: DC

## 2021-05-31 MED ORDER — HEPARIN SODIUM (PORCINE) 5000 UNIT/ML IJ SOLN
5000.0000 [IU] | Freq: Three times a day (TID) | INTRAMUSCULAR | Status: DC
  Administered 2021-05-31 – 2021-06-02 (×6): 5000 [IU] via SUBCUTANEOUS
  Filled 2021-05-31 (×6): qty 1

## 2021-05-31 MED ORDER — METRONIDAZOLE 500 MG/100ML IV SOLN
500.0000 mg | Freq: Once | INTRAVENOUS | Status: AC
  Administered 2021-05-31: 500 mg via INTRAVENOUS
  Filled 2021-05-31: qty 100

## 2021-05-31 MED ORDER — INSULIN ASPART 100 UNIT/ML IJ SOLN
0.0000 [IU] | INTRAMUSCULAR | Status: DC
  Administered 2021-05-31: 9 [IU] via SUBCUTANEOUS

## 2021-05-31 MED ORDER — DEXTROSE 50 % IV SOLN: 0.0000 mL | INTRAVENOUS | Status: DC | PRN

## 2021-05-31 MED ORDER — SODIUM CHLORIDE 0.9 % IV SOLN
2.0000 g | Freq: Once | INTRAVENOUS | Status: AC
  Administered 2021-05-31: 2 g via INTRAVENOUS
  Filled 2021-05-31: qty 12.5

## 2021-05-31 MED ORDER — POLYETHYLENE GLYCOL 3350 17 G PO PACK: 17.0000 g | PACK | Freq: Every day | ORAL | Status: DC | PRN

## 2021-05-31 MED ORDER — NOREPINEPHRINE 4 MG/250ML-% IV SOLN
2.0000 ug/min | INTRAVENOUS | Status: DC
  Administered 2021-05-31: 2 ug/min via INTRAVENOUS
  Administered 2021-06-01: 4 ug/min via INTRAVENOUS
  Filled 2021-05-31: qty 250

## 2021-05-31 NOTE — Telephone Encounter (Signed)
Pt daughter called to advised the mother b/p was 62/37 with a pulse 74 pt was given something to eat then her b/p went up to 198/168 per daughter. Mrs. Courtney Goodman to recheck pt b/p to rule out machine error and pt b/p was 61/40 pulse 73. Pt daughter was advised to take mother to er due to the readings and pt not responding as she normally would.  [place] ?

## 2021-05-31 NOTE — Progress Notes (Signed)
Courtney Goodman [id number] ?Admission Data: 05/31/2021 6:31 PM ?Attending Provider: Jacky Kindle, MD  ?DDU:[id number], Mare Ferrari, PA-C ?Consults/ Treatment Team:  ? ?Courtney Goodman is a 76 y.o. female patient admitted from ED awake, alert  & orientated  X 3,  Full Code, VSS - Blood pressure (!) 135/47, pulse (!) 112, temperature 97.6 ?F (36.4 ?C), temperature source Axillary, resp. rate (!) 23, SpO2 100 %., O2   2 L nasal cannular, no c/o shortness of breath, no c/o chest pain, no distress noted. Tele # [id number], placed and pt is currently running:normal sinus rhythm. ? ? IV site WDL:  forearm right, condition patent and no redness and left, condition patent and no redness and antecubital right, condition patent and no redness and left, condition patent and no redness with a transparent dsg that's clean dry and intact. ? ? Patient admitted to [facility] room 13 Skin, clean-dry- intact without evidence of bruising, or skin tears.   ?No evidence of skin break down noted on exam. ?  ?No belongings at bedside.  ? ? ? ?Will cont to monitor and assist as needed. ? ?Hosie Spangle, RN ?05/31/2021 6:31 PM  ?

## 2021-05-31 NOTE — ED Provider Notes (Signed)
?[facility] ?Provider Note ? ? ?CSN: [id number] ?Arrival date & time: 05/31/21  1034 ? ?  ? ?History ?Chief Complaint  ?Patient presents with  ? Unresponsive  ? Hypotensive  ? Hyperglycemia  ? ? ?Courtney Goodman is a 76 y.o. female. ? ?Patient brought in by EMS presenting unresponsive, daughter at bedside and provided history. Past medical history includes hypertension, type 2 DM, dementia, hyperlipidemia, CKD stage 3 and vitamin D deficiency. Daughter reports that patient's last known normal was 6pm yesterday when she got up and then went back to sleep, they tried to wake her up 2 hours later but she was unresponsive. She has still been able to eat during this time, ate peanuts and part of a ham sandwich this morning. Daughter has been routinely checking her blood pressures and pulses which have been as low as 62/40 at home with a pulse as low as 32. Mental status has been progressively declining and she is on medication for this. She uses both a wheelchair and walker, she has been living with her daughter and son-in-law over the past 5 months. Before this time, she was having more frequent ED visits. Daughter denies any new medication changes.  ? ?The history is provided by a relative. The history is limited by the condition of the patient. No language interpreter was used.  ? ?  ? ?Home Medications ?Prior to Admission medications   ?Medication Sig Start Date End Date Taking? Authorizing Provider  ?albuterol (PROVENTIL HFA;VENTOLIN HFA) 108 (90 Base) MCG/ACT inhaler Inhale 2 puffs into the lungs every 6 (six) hours as needed for wheezing or shortness of breath. 04/14/17  Yes Henson, Vickie L, NP-C  ?aspirin EC 81 MG tablet Take 81 mg by mouth daily.    Yes [provider]  ?Biotin 10000 MCG TABS Take 10,000 mcg by mouth daily.   Yes [provider]  ?carvedilol (COREG) 6.25 MG tablet Take 6.25 mg by mouth in the morning and at bedtime.   Yes [provider]  ?fenofibrate (TRICOR) 145 MG tablet Take 1 tablet (145 mg total) by mouth daily. 04/12/21  Yes Francis Gaines B, PA-C  ?folic acid (FOLVITE) 1 MG tablet TAKE ONE TABLET BY MOUTH EVERY MORNING ?Patient taking differently: Take 1 mg by mouth in the morning. 05/10/21  Yes [name], PA-C  ?Insulin NPH, Human,, Isophane, (NOVOLIN N FLEXPEN) 100 UNIT/ML Kiwkpen Inject 15 Units into the skin every morning. And pen needles 1/day ?Patient taking differently: Inject 5-18 Units into the skin See admin instructions. Inject 18 units into the skin in the morning and 5 units in the evening if BGL is 300 or greater 02/05/21  Yes Ghimire, Henreitta Leber, MD  ?liraglutide (VICTOZA) 18 MG/3ML SOPN Inject 0.6 mg into the skin every morning. 10/27/20  Yes Renato Shin, MD  ?memantine (NAMENDA) 10 MG tablet Take 1 tablet (10 mg at night) for 2 weeks, then increase to 1 tablet (10 mg) twice a day ?Patient taking differently: Take 10 mg by mouth in the morning and at bedtime. 02/01/21  Yes [name], PA-C  ?omeprazole (PRILOSEC) 40 MG capsule TAKE ONE CAPSULE BY MOUTH EVERY MORNING ?Patient taking differently: 40 mg daily before breakfast. 04/12/21  Yes Francis Gaines B, PA-C  ?simvastatin (ZOCOR) 20 MG tablet Take 1 tablet (20 mg total) by mouth at bedtime. 04/20/21  Yes [name], PA-C  ?Alcohol Swabs (ALCOHOL PREP) PADS Use for testing ?Patient taking differently: 1 each  by Other route 2 (two) times daily. E11.9 08/07/18   Henson, Vickie L, NP-C  ?carvedilol (COREG) 12.5 MG tablet TAKE ONE TABLET BY MOUTH EVERY MORNING and TAKE ONE TABLET BY MOUTH EVERYDAY AT BEDTIME ?Patient not taking: Reported on 05/31/2021 02/05/21   Jonetta Osgood, MD  ?Cholecalciferol 25 MCG (1000 UT) tablet Take 1 tablet by mouth daily. ?Patient not taking: Reported on 05/31/2021 08/11/19   [name] L, NP-C  ?fluticasone (FLONASE) 50 MCG/ACT nasal spray Place 2 sprays into both nostrils daily. ?Patient not taking: Reported on  05/31/2021 07/25/16   [name] L, NP-C  ?glucose blood (ONETOUCH VERIO) test strip 1 each by Other route 2 (two) times daily. And lancets 2/day 10/27/20   Renato Shin, MD  ?Insulin Pen Needle (PEN NEEDLES) 32G X 5 MM MISC 1 each by Does not apply route daily. 10/27/20   Renato Shin, MD  ?Lancets [facility] DELICA PLUS [id number]) MISC USE TO test four times A DAY 10/27/20   Renato Shin, MD  ?rivastigmine (EXELON) 4.6 mg/24hr Place 1 patch (4.6 mg total) onto the skin daily. ?Patient not taking: Reported on 05/31/2021 11/13/20   Cameron Sprang, MD  ?   ? ?Allergies    ?Patient has no known allergies.   ? ?Review of Systems   ?Review of Systems  ?Constitutional:  Positive for activity change. Negative for appetite change, chills and fever.  ?Respiratory:  Negative for cough, choking and shortness of breath.   ?Cardiovascular:  Negative for chest pain and leg swelling.  ?Gastrointestinal:  Negative for abdominal pain.  ?Neurological:  Negative for seizures, facial asymmetry and speech difficulty.  ?Psychiatric/Behavioral:  Positive for confusion.   ? ?Physical Exam ?Updated Vital Signs ?BP (!) 83/49 (BP Location: Right Arm)   Pulse 69   Temp 97.8 ?F (36.6 ?C) (Rectal)   Resp 17   SpO2 100%  ?Physical Exam ?Vitals reviewed.  ?Constitutional:   ?   Appearance: She is ill-appearing.  ?HENT:  ?   Nose: No congestion or rhinorrhea.  ?Eyes:  ?   General: Scleral icterus present.     ?   Right eye: No discharge.     ?   Left eye: No discharge.  ?   Comments: Pale conjunctiva bilaterally  ?Cardiovascular:  ?   Rate and Rhythm: Normal rate and regular rhythm.  ?   Pulses: Normal pulses.  ?   Heart sounds: No murmur heard. ?  No gallop.  ?Pulmonary:  ?   Effort: Pulmonary effort is normal. No respiratory distress.  ?   Breath sounds: No wheezing or rales.  ?Abdominal:  ?   General: Bowel sounds are normal.  ?   Palpations: Abdomen is soft.  ?   Tenderness: There is no abdominal tenderness.  ?Musculoskeletal:  ?   Cervical  back: Neck supple.  ?   Right lower leg: No edema.  ?   Left lower leg: No edema.  ?Skin: ?   General: Skin is dry.  ?   Coloration: Skin is pale.  ?Neurological:  ?   Mental Status: She is disoriented.  ?   Comments: Patient responds to tactile stimuli but not verbal stimuli, unable to follow commands.  ? ? ?ED Results / Procedures / Treatments   ?Labs ?(all labs ordered are listed, but only abnormal results are displayed) ?Labs Reviewed  ?COMPREHENSIVE METABOLIC PANEL - Abnormal; Notable for the following components:  ?    Result Value  ? Sodium 129 (*)   ?  Chloride 95 (*)   ? CO2 16 (*)   ? Glucose, Bld 357 (*)   ? BUN 63 (*)   ? Creatinine, Ser 4.26 (*)   ? Total Protein 6.3 (*)   ? Albumin 2.4 (*)   ? AST 69 (*)   ? GFR, Estimated 10 (*)   ? Anion gap 18 (*)   ? All other components within normal limits  ?CBC WITH DIFFERENTIAL/PLATELET - Abnormal; Notable for the following components:  ? WBC 35.6 (*)   ? RBC 3.07 (*)   ? Hemoglobin 9.4 (*)   ? HCT 29.0 (*)   ? RDW 15.9 (*)   ? Neutro Abs 33.5 (*)   ? Lymphs Abs 0.4 (*)   ? Monocytes Absolute 1.4 (*)   ? nRBC 1 (*)   ? Abs Immature Granulocytes 0.40 (*)   ? All other components within normal limits  ?URINALYSIS, ROUTINE W REFLEX MICROSCOPIC - Abnormal; Notable for the following components:  ? APPearance HAZY (*)   ? Glucose, UA >=500 (*)   ? Hgb urine dipstick MODERATE (*)   ? Protein, ur 30 (*)   ? Bacteria, UA RARE (*)   ? All other components within normal limits  ?CBG MONITORING, ED - Abnormal; Notable for the following components:  ? Glucose-Capillary 329 (*)   ? All other components within normal limits  ?I-STAT VENOUS BLOOD GAS, ED - Abnormal; Notable for the following components:  ? pCO2, Ven 34.0 (*)   ? pO2, Ven 112 (*)   ? Bicarbonate 17.3 (*)   ? TCO2 18 (*)   ? Acid-base deficit 8.0 (*)   ? Sodium 128 (*)   ? Calcium, Ion 1.09 (*)   ? HCT 29.0 (*)   ? Hemoglobin 9.9 (*)   ? All other components within normal limits  ?TROPONIN I (HIGH SENSITIVITY) -  Abnormal; Notable for the following components:  ? Troponin I (High Sensitivity) 36 (*)   ? All other components within normal limits  ?CULTURE, BLOOD (ROUTINE X 2)  ?CULTURE, BLOOD (ROUTINE X 2)  ?RESP PANEL BY RT-PCR

## 2021-05-31 NOTE — Sepsis Progress Note (Signed)
Notified bedside nurse of need to draw lactic acid.  Bedside RN informed this RN that pt is a hard stick. Blood cultures were drawn but attempts to draw lactic acid have failed. ?

## 2021-05-31 NOTE — ED Provider Notes (Addendum)
I saw and evaluated the patient, reviewed the resident's note and I agree with the findings and plan. ? ?EKG Interpretation ? ?Date/Time:  Thursday May 31 2021 10:44:41 EDT ?Ventricular Rate:  71 ?PR Interval:  155 ?QRS Duration: 130 ?QT Interval:  431 ?QTC Calculation: 469 ?R Axis:   69 ?Text Interpretation: Sinus rhythm Right bundle branch block No significant change since last tracing Confirmed by Lacretia Leigh ([id number]) on 05/31/2021 11:06:22 AM ? ?76 year old female here with altered middle status x24 hours.  Patient has history of dementia but can usually answer simple questions.  Was found be hypotensive by her daughter and EMS called.  Was found to be hypotensive by them and given IV fluids.  Was hyperglycemic with sugar of over 400.  On exam here, patient does withdraw to pain.  Protecting her airway at this time.  Concern for possible sepsis versus other etiologies of hypotension including blood loss.  Patient's EKG per my interpretation is unchanged from prior.  Shows normal sinus rhythm with right bundle branch block.  Labs and x-rays are pending at this time.  Patient will require admission ? ?12:39 PM ?Patient reassessed after she received her 30 cc/kg bolus of IV saline.  Patient's blood pressure repeated multiple times and evaluated by me.  Patient's airway status reevaluated multiple times by me and remained stable.  Patient has become more responsive according to the daughter who is at bedside.  Code sepsis had been initiated prior to this.  Patient received IV antibiotics.  Labs are significant for a elevated leukocytosis.  Also has evidence of acute kidney injury on her electrolyte panel.  Patient had an ABG which showed no signs of hypercapnia but does shows evidence of metabolic acidosis.  Despite aggressive fluid resuscitation, patient remains hypotensive with a blood pressure in the low 80s.  Patient will be started on vasopressors, Levophed.  Plan will be for ICU admission ? ? ?CRITICAL  CARE ?Performed by: Leota Jacobsen ?Total critical care time: 75 minutes ?Critical care time was exclusive of separately billable procedures and treating other patients. ?Critical care was necessary to treat or prevent imminent or life-threatening deterioration. ?Critical care was time spent personally by me on the following activities: development of treatment plan with patient and/or surrogate as well as nursing, discussions with consultants, evaluation of patient's response to treatment, examination of patient, obtaining history from patient or surrogate, ordering and performing treatments and interventions, ordering and review of laboratory studies, ordering and review of radiographic studies, pulse oximetry and re-evaluation of patient's condition. ? ?  ?Lacretia Leigh, MD ?05/31/21 1112 ? ?  ?Lacretia Leigh, MD ?05/31/21 1241 ? ?

## 2021-05-31 NOTE — Progress Notes (Signed)
Pt currently has an USGPIV with pressors infusing. RN to notify if another needed. ?

## 2021-05-31 NOTE — Progress Notes (Signed)
eLink Physician-Brief Progress Note ?Patient Name: Margie Urbanowicz Zeiss ?DOB: 1945-12-12 ?MRN: [id number] ? ? ?Date of Service ? 05/31/2021  ?HPI/Events of Note ? Last K+ = 5.1. Patient now on insulin IV infusion for hyperglycemia.   ?eICU Interventions ? Will repeat BMP now.   ? ? ? ?Intervention Category ?Major Interventions: Electrolyte abnormality - evaluation and management ? ?Reniah Cottingham Cornelia Copa ?05/31/2021, 8:25 PM ?

## 2021-05-31 NOTE — H&P (Addendum)
? ?NAME:  KAMYIA THOMASON, MRN:  [id number], DOB:  10-12-1945, LOS: 0 ?ADMISSION DATE:  05/31/2021 CONSULTATION DATE:  05/31/2021 ?REFERRING MD:  Zenia Resides - EDP CHIEF COMPLAINT:  AMS, sepsis ? ?History of Present Illness:  ?76 year old woman who presented to [facility] ED via EMS for AMS x 1 day, hyperglycemia to 500s and concern for sepsis. PMHx significant for HTN, HLD, CAD, CVA (~15 years ago), asthma/COPD, T2DM, CKD stage III, anxiety/depression. ? ?History obtained from daughter (at bedside). She reports that patient began acting confused yesterday and that blood glucoses were measuring higher than normal (300s as compared to 250s-260s). She was noted to be hypotensive today with SBP 70s and EMS was called. Daughter denies c/o fevers/chills, CP/SOB, n/v/d, tends toward constipation. Denies poor PO intake (drinks "8 bottles of water" daily). No significant changes in diet, no c/d abdominal pain or swelling. ? ?On ED arrival, patient was afebrile with HR 60s-80s, SBP 70s-80s and normal RR, sats 98-100% on 2LNC. Labs were notable for WBC 35, H&H 9.4/29.0, Na 129, K 5.1, BUN/Cr 63/4.26 (baseliine 2.0-2.4), Glucose 357, ASTALT 69/26.  IV fluid resuscitation was initiated for possible sepsis and peripheral Levophed was initiated. UA/UCx and BCx were ordered. Broad-spectrum abx were initiated. CT Head NAICA. CT A/P demonstrated new 4.7 x 4.1cm mass-like hypodensity in the pancreatic head, c/f pancreatic pseudocyst vs. walled-off necrosis vs. contained duodenal perf, less likely neoplasm. ? ?PCCM consulted for ICU admission in the setting of hypotension, pressor needs, possible developing sepsis. ? ?Pertinent Medical History:  ? ?Past Medical History:  ?Diagnosis Date  ? Anemia of chronic disease   ? Asthma   ? Atherosclerotic heart disease of native coronary artery without angina pectoris   ? CKD (chronic kidney disease)   ? COPD (chronic obstructive pulmonary disease) ([place])   ? DM (diabetes mellitus), type 2 with renal  complications ([place])   ? Dysrhythmia   ? Elevated ferritin 01/03/2017  ? GAD (generalized anxiety disorder)   ? Hypertension   ? Mixed hyperlipidemia due to type 2 diabetes mellitus ([place])   ? Mixed incontinence   ? per medical records from [place]  ? Osteopenia   ? Personal history of noncompliance with medical treatment, presenting hazards to health   ? Shortness of breath dyspnea   ? Suicidal ideation   ? TB (tuberculosis), treated   ? age 32  ? Vitamin D deficiency   ? ?Significant Hospital Events: ?Including procedures, antibiotic start and stop dates in addition to other pertinent events   ?5/4 - Presented to [facility] ED via EMS for hypotension, hyperglycemia. CT Head negative. CT A/P demonstrated new 4.7 x 4.1cm mass-like hypodensity in the pancreatic head. Empiric cefepime/vanc started. Cx obtained. Peripheral NE started. ? ?Interim History / Subjective:  ?PCCM consulted for ICU admission ? ?Objective:  ?Blood pressure (!) 83/49, pulse 69, temperature 97.8 ?F (36.6 ?C), temperature source Rectal, resp. rate 17, SpO2 100 %. ?   ?   ?No intake or output data in the 24 hours ending 05/31/21 1350 ?There were no vitals filed for this visit. ? ?Physical Examination: ?General: Chronically ill-appearing elderly woman in NAD. Appears confused, intermittently responsive, writhing on stretcher. ?HEENT: [place]/AT, anicteric sclera, PERRL, dry mucous membranes. ?Neuro: Lethargic. Responds to noxious stimuli, withdraws to pain in all 4 extremities.Not following commands. Moves all 4 extremities spontaneously. +Corneal, +Cough, and +Gag  ?CV: RRR, no m/g/r. ?PULM: Breathing even and unlabored on 2LNC. Lung fields CTAB, diminished at bilateral bases. ?GI: Soft, nontender, nondistended. Normoactive bowel  sounds. ?Extremities: No LE edema noted. ?Skin: Warm/dry, very dry/scaling over lower extremities. ? ?Resolved Hospital Problem List:  ? ? ?Assessment & Plan:  ?Septic shock ?- Admit to ICU for close monitoring ?- Goal MAP > 65 ?- Fluid  resuscitation ?- Levophed titrated to MAP goal, continue peripheral as able ?- Trend CBC, fever curve ?- Trend LA, PCT ?- F/u Cx data ?- Continue broad-spectrum Cefepime/Vanc ? ?New pancreatic head hypodensity/mass ?CT A/P 5/4 demonstrated new 4.7 x 4.1cm mass-like hypodensity in the pancreatic head, c/f pancreatic pseudocyst vs. walled-off necrosis vs. contained duodenal perf, less likely neoplasm in the setting of rapid development since 01/2021. ?- F/u additional imaging ?- F/u amylase/lipase, pending ? ?Hyperglycemia ?T2DM ?Home regimen includes: Insulin NPH 18U QAM, 5U QPM if BG > 300; Victoza 0.'6mg'$  daily ?- Monitor CBGs Q4H ?- SSI ?- May require insulin gtt/Endotool if glucose does not rapidly improve ?- Goal CBGs 140-180 ?- F/u Beta-hydroxybutyrate, A1C ?- Diabetes management team consult ? ?Hypertension ?CAD ?HLD ?- Hold home medications in the setting of hypotension ? ?History of COPD/asthma ?- Continue supplemental O2 support ?- Wean FiO2 for O2 sat > 90% ?- Bronchodilators PRN ?- Pulmonary hygiene ? ?AKI likely secondary to volume losses ?CKD stage III ?- Trend BMP ?- Replete electrolytes as indicated ?- Monitor I&Os ?- F/u urine studies ?- Avoid nephrotoxic agents as able ?- Ensure adequate renal perfusion ? ?Dementia ?History of stroke (remote, ~15 years ago) ?- Frequent neuro exams/mental status exams ?- Resume home Namenda, rivastigmine as clinically appropriate ? ?Best Practice: (right click and "Reselect all SmartList Selections" daily)  ? ?Diet/type: NPO ?DVT prophylaxis: prophylactic heparin  ?GI prophylaxis: PPI ?Lines: N/A ?Foley:  N/A ?Code Status:  full code ?Last date of multidisciplinary goals of care discussion [Briefly discussed with daughter at bedside 5/4, she confirms full code at this time.] ? ?Labs:  ?CBC: ?Recent Labs  ?Lab 05/31/21 ?1056 05/31/21 ?1115 05/31/21 ?1251  ?WBC 35.6*  --   --   ?NEUTROABS 33.5*  --   --   ?HGB 9.4* 9.9* 9.9*  ?HCT 29.0* 29.0* 29.0*  ?MCV 94.5  --   --    ?PLT 258  --   --   ? ?Basic Metabolic Panel: ?Recent Labs  ?Lab 05/31/21 ?1056 05/31/21 ?1115 05/31/21 ?1251  ?NA 129* 128* 128*  ?K 5.1 5.0 5.1  ?CL 95*  --   --   ?CO2 16*  --   --   ?GLUCOSE 357*  --   --   ?BUN 63*  --   --   ?CREATININE 4.26*  --   --   ?CALCIUM 9.0  --   --   ? ?GFR: ?CrCl cannot be calculated (Unknown ideal weight.). ?Recent Labs  ?Lab 05/31/21 ?1056  ?WBC 35.6*  ? ?Liver Function Tests: ?Recent Labs  ?Lab 05/31/21 ?1056  ?AST 69*  ?ALT 26  ?ALKPHOS 89  ?BILITOT 0.9  ?PROT 6.3*  ?ALBUMIN 2.4*  ? ?No results for input(s): LIPASE, AMYLASE in the last 168 hours. ?No results for input(s): AMMONIA in the last 168 hours. ? ?ABG: ?   ?Component Value Date/Time  ? PHART 7.517 (H) 10/03/2018 2313  ? PCO2ART 29.1 (L) 10/03/2018 2313  ? PO2ART 130.0 (H) 10/03/2018 2313  ? HCO3 17.5 (L) 05/31/2021 1251  ? TCO2 19 (L) 05/31/2021 1251  ? ACIDBASEDEF 9.0 (H) 05/31/2021 1251  ? O2SAT 99 05/31/2021 1251  ?  ?Coagulation Profile: ?No results for input(s): INR, PROTIME in the last 168 hours. ? ?  Cardiac Enzymes: ?No results for input(s): CKTOTAL, CKMB, CKMBINDEX, TROPONINI in the last 168 hours. ? ?HbA1C: ?Hemoglobin A1C  ?Date/Time Value Ref Range Status  ?09/12/2020 01:29 PM 10.6 (A) 4.0 - 5.6 % Final  ?06/20/2020 02:03 PM 8.9 (A) 4.0 - 5.6 % Final  ? ?Hgb A1c MFr Bld  ?Date/Time Value Ref Range Status  ?02/03/2021 11:00 PM 9.3 (H) 4.8 - 5.6 % Final  ?  Comment:  ?  (NOTE) ?Pre diabetes:          5.7%-6.4% ? ?Diabetes:              >6.4% ? ?Glycemic control for   <7.0% ?adults with diabetes ?  ? ?CBG: ?Recent Labs  ?Lab 05/31/21 ?1043  ?GLUCAP 329*  ? ?Review of Systems:   ?Review of systems completed with pertinent positives/negatives outlined in above HPI. ? ?Past Medical History:  ?She,  has a past medical history of Anemia of chronic disease, Asthma, Atherosclerotic heart disease of native coronary artery without angina pectoris, CKD (chronic kidney disease), COPD (chronic obstructive pulmonary disease)  ([place]), DM (diabetes mellitus), type 2 with renal complications ([place]), Dysrhythmia, Elevated ferritin (01/03/2017), GAD (generalized anxiety disorder), Hypertension, Mixed hyperlipidemia due to type 2 diabetes mellitus

## 2021-05-31 NOTE — ED Triage Notes (Signed)
Pt BIB [facility] from home where she lives with daughter as Andre Lefort with Hypotension & Hyperglycemia. She has been having decreased mental status over the last 24 hrs, baseline orientation is A/Ox2. At this time she responds to painful stimuli, pupils are equal & reactive. Her pressure is 86/43, 12L showed a Rt BBB (per EMS), 70 bpm, Temp of 97.8, CBG 415. Hx of past stroke, DM & Stage 3 CKD. 22g PIV in Lt lateral wrist. ?

## 2021-05-31 NOTE — Progress Notes (Signed)
Pharmacy Antibiotic Note ? ?Courtney Goodman is a 76 y.o. female admitted on 05/31/2021 presenting with AMS, concern for sepsis.  Pharmacy has been consulted for vancomycin and cefepime dosing.  Hx CKD, BL ~ 2.5, SCr 4.26 ? ?Plan: ?Vancomycin 1500 mg IV x 1, then variable dosing due to unstable renal function ?Cefepime 2g IV x 1, then 1g IV q 24 hours ?Monitor renal function, Cx and clinical progression to narrow ?Vancomycin levels as needed ? ?  ? ?No data recorded. ? ?Recent Labs  ?Lab 05/31/21 ?1056  ?WBC 35.6*  ?CREATININE 4.26*  ?  ?CrCl cannot be calculated (Unknown ideal weight.).   ? ?No Known Allergies ? ?Bertis Ruddy, PharmD ?Clinical Pharmacist ?ED Pharmacist Phone # [phone] ?05/31/2021 11:54 AM ? ? ?

## 2021-05-31 NOTE — Sepsis Progress Note (Signed)
Sepsis protocol monitored by eLink 

## 2021-05-31 NOTE — Sepsis Progress Note (Signed)
Sent secure chat to bedside RN in the ICU that lactic acid ordered in the ED was never drawn. Requested that it be drawn with next lab draw. ?

## 2021-06-01 ENCOUNTER — Inpatient Hospital Stay (HOSPITAL_COMMUNITY): Payer: Medicare HMO

## 2021-06-01 DIAGNOSIS — A419 Sepsis, unspecified organism: Secondary | ICD-10-CM | POA: Diagnosis not present

## 2021-06-01 LAB — BLOOD CULTURE ID PANEL (REFLEXED) - BCID2

## 2021-06-01 LAB — CBC
HCT: 27.9 % — ABNORMAL LOW (ref 36.0–46.0)
Hemoglobin: 9.3 g/dL — ABNORMAL LOW (ref 12.0–15.0)
MCH: 29.9 pg (ref 26.0–34.0)
MCHC: 33.3 g/dL (ref 30.0–36.0)
MCV: 89.7 fL (ref 80.0–100.0)
Platelets: 198 10*3/uL (ref 150–400)
RBC: 3.11 MIL/uL — ABNORMAL LOW (ref 3.87–5.11)
RDW: 15.7 % — ABNORMAL HIGH (ref 11.5–15.5)
WBC: 32.9 10*3/uL — ABNORMAL HIGH (ref 4.0–10.5)
nRBC: 0 % (ref 0.0–0.2)

## 2021-06-01 LAB — GLUCOSE, CAPILLARY
Glucose-Capillary: 136 mg/dL — ABNORMAL HIGH (ref 70–99)
Glucose-Capillary: 142 mg/dL — ABNORMAL HIGH (ref 70–99)
Glucose-Capillary: 156 mg/dL — ABNORMAL HIGH (ref 70–99)
Glucose-Capillary: 165 mg/dL — ABNORMAL HIGH (ref 70–99)
Glucose-Capillary: 169 mg/dL — ABNORMAL HIGH (ref 70–99)
Glucose-Capillary: 173 mg/dL — ABNORMAL HIGH (ref 70–99)
Glucose-Capillary: 178 mg/dL — ABNORMAL HIGH (ref 70–99)
Glucose-Capillary: 182 mg/dL — ABNORMAL HIGH (ref 70–99)
Glucose-Capillary: 187 mg/dL — ABNORMAL HIGH (ref 70–99)
Glucose-Capillary: 190 mg/dL — ABNORMAL HIGH (ref 70–99)
Glucose-Capillary: 195 mg/dL — ABNORMAL HIGH (ref 70–99)
Glucose-Capillary: 195 mg/dL — ABNORMAL HIGH (ref 70–99)
Glucose-Capillary: 197 mg/dL — ABNORMAL HIGH (ref 70–99)
Glucose-Capillary: 214 mg/dL — ABNORMAL HIGH (ref 70–99)
Glucose-Capillary: 215 mg/dL — ABNORMAL HIGH (ref 70–99)

## 2021-06-01 LAB — BETA-HYDROXYBUTYRIC ACID: Beta-Hydroxybutyric Acid: 0.08 mmol/L (ref 0.05–0.27)

## 2021-06-01 LAB — MAGNESIUM: Magnesium: 1.6 mg/dL — ABNORMAL LOW (ref 1.7–2.4)

## 2021-06-01 LAB — BASIC METABOLIC PANEL
Anion gap: 15 (ref 5–15)
BUN: 66 mg/dL — ABNORMAL HIGH (ref 8–23)
CO2: 18 mmol/L — ABNORMAL LOW (ref 22–32)
Calcium: 8.5 mg/dL — ABNORMAL LOW (ref 8.9–10.3)
Chloride: 100 mmol/L (ref 98–111)
Creatinine, Ser: 3.85 mg/dL — ABNORMAL HIGH (ref 0.44–1.00)
GFR, Estimated: 12 mL/min — ABNORMAL LOW (ref >60)
Glucose, Bld: 187 mg/dL — ABNORMAL HIGH (ref 70–99)
Potassium: 3.7 mmol/L (ref 3.5–5.1)
Sodium: 133 mmol/L — ABNORMAL LOW (ref 135–145)

## 2021-06-01 LAB — PHOSPHORUS: Phosphorus: 4.2 mg/dL (ref 2.5–4.6)

## 2021-06-01 LAB — LACTIC ACID, PLASMA: Lactic Acid, Venous: 3.8 mmol/L (ref 0.5–1.9)

## 2021-06-01 MED ORDER — SODIUM CHLORIDE 0.9 % IV BOLUS
500.0000 mL | Freq: Once | INTRAVENOUS | Status: AC
Start: 2021-06-01 — End: 2021-06-01
  Administered 2021-06-01: 500 mL via INTRAVENOUS

## 2021-06-01 MED ORDER — SODIUM CHLORIDE 0.9 % IV SOLN
2.0000 g | INTRAVENOUS | Status: DC
  Administered 2021-06-01 – 2021-06-03 (×3): 2 g via INTRAVENOUS
  Filled 2021-06-01 (×3): qty 20

## 2021-06-01 MED ORDER — LACTATED RINGERS IV SOLN: INTRAVENOUS | Status: DC

## 2021-06-01 MED ORDER — INSULIN GLARGINE-YFGN 100 UNIT/ML ~~LOC~~ SOLN
10.0000 [IU] | SUBCUTANEOUS | Status: DC
  Administered 2021-06-01 – 2021-06-05 (×5): 10 [IU] via SUBCUTANEOUS
  Filled 2021-06-01 (×7): qty 0.1

## 2021-06-01 MED ORDER — MAGNESIUM SULFATE 2 GM/50ML IV SOLN
2.0000 g | Freq: Once | INTRAVENOUS | Status: AC
  Administered 2021-06-01: 2 g via INTRAVENOUS
  Filled 2021-06-01: qty 50

## 2021-06-01 MED ORDER — CHLORHEXIDINE GLUCONATE CLOTH 2 % EX PADS
6.0000 | MEDICATED_PAD | Freq: Every day | CUTANEOUS | Status: DC
  Administered 2021-05-31 – 2021-06-04 (×5): 6 via TOPICAL

## 2021-06-01 MED ORDER — INSULIN ASPART 100 UNIT/ML IJ SOLN
0.0000 [IU] | INTRAMUSCULAR | Status: DC
  Administered 2021-06-01 (×2): 2 [IU] via SUBCUTANEOUS
  Administered 2021-06-01 – 2021-06-02 (×5): 1 [IU] via SUBCUTANEOUS
  Administered 2021-06-02 (×2): 3 [IU] via SUBCUTANEOUS
  Administered 2021-06-03: 2 [IU] via SUBCUTANEOUS
  Administered 2021-06-03: 1 [IU] via SUBCUTANEOUS
  Administered 2021-06-03: 5 [IU] via SUBCUTANEOUS
  Administered 2021-06-03: 3 [IU] via SUBCUTANEOUS
  Administered 2021-06-04: 1 [IU] via SUBCUTANEOUS
  Administered 2021-06-04: 2 [IU] via SUBCUTANEOUS
  Administered 2021-06-04: 1 [IU] via SUBCUTANEOUS
  Administered 2021-06-04 (×2): 3 [IU] via SUBCUTANEOUS
  Administered 2021-06-05: 2 [IU] via SUBCUTANEOUS
  Administered 2021-06-05: 1 [IU] via SUBCUTANEOUS
  Administered 2021-06-05: 3 [IU] via SUBCUTANEOUS
  Administered 2021-06-05: 5 [IU] via SUBCUTANEOUS
  Administered 2021-06-06: 2 [IU] via SUBCUTANEOUS
  Administered 2021-06-06: 1 [IU] via SUBCUTANEOUS

## 2021-06-01 NOTE — Progress Notes (Signed)
eLink Physician-Brief Progress Note ?Patient Name: Courtney Goodman ?DOB: [birth year] ?MRN: [id number] ? ? ?Date of Service ? 06/01/2021  ?HPI/Events of Note ? Multiple issues: 1. Hypomagnesemia - Mg++ = 1.6 and Creatinine = 3.85. 2. Lactic Acid = 4.1 earlier.   ?eICU Interventions ? Plan: ?Will replace Mg++. ?Will repeat Lactic Acid now to assess resolution.   ? ? ? ?Intervention Category ?Major Interventions: Acid-Base disturbance - evaluation and management;Electrolyte abnormality - evaluation and management ? ?Leondro Coryell Cornelia Copa ?06/01/2021, 5:13 AM ?

## 2021-06-01 NOTE — Progress Notes (Addendum)
? ?NAME:  Courtney Goodman, MRN:  [id number], DOB:  06-Mar-1945, LOS: 1 ?ADMISSION DATE:  05/31/2021 CONSULTATION DATE:  05/31/2021 ?REFERRING MD:  Zenia Resides - EDP CHIEF COMPLAINT:  AMS, sepsis ? ?History of Present Illness:  ?76 year old woman who presented to [facility] ED via EMS for AMS x 1 day, hyperglycemia to 500s and concern for sepsis. PMHx significant for HTN, HLD, CAD, CVA (~15 years ago), asthma/COPD, T2DM, CKD stage III, anxiety/depression. ? ?History obtained from daughter (at bedside). She reports that patient began acting confused yesterday and that blood glucoses were measuring higher than normal (300s as compared to 250s-260s). She was noted to be hypotensive today with SBP 70s and EMS was called. Daughter denies c/o fevers/chills, CP/SOB, n/v/d, tends toward constipation. Denies poor PO intake (drinks "8 bottles of water" daily). No significant changes in diet, no c/d abdominal pain or swelling. ? ?On ED arrival, patient was afebrile with HR 60s-80s, SBP 70s-80s and normal RR, sats 98-100% on 2LNC. Labs were notable for WBC 35, H&H 9.4/29.0, Na 129, K 5.1, BUN/Cr 63/4.26 (baseliine 2.0-2.4), Glucose 357, ASTALT 69/26.  IV fluid resuscitation was initiated for possible sepsis and peripheral Levophed was initiated. UA/UCx and BCx were ordered. Broad-spectrum abx were initiated. CT Head NAICA. CT A/P demonstrated new 4.7 x 4.1cm mass-like hypodensity in the pancreatic head, c/f pancreatic pseudocyst vs. walled-off necrosis vs. contained duodenal perf, less likely neoplasm. ? ?PCCM consulted for ICU admission in the setting of hypotension, pressor needs, possible developing sepsis. ? ?Pertinent Medical History:  ? ?Past Medical History:  ?Diagnosis Date  ? Anemia of chronic disease   ? Asthma   ? Atherosclerotic heart disease of native coronary artery without angina pectoris   ? CKD (chronic kidney disease)   ? COPD (chronic obstructive pulmonary disease) ([place])   ? DM (diabetes mellitus), type 2 with renal  complications ([place])   ? Dysrhythmia   ? Elevated ferritin 01/03/2017  ? GAD (generalized anxiety disorder)   ? Hypertension   ? Mixed hyperlipidemia due to type 2 diabetes mellitus ([place])   ? Mixed incontinence   ? per medical records from [place]  ? Osteopenia   ? Personal history of noncompliance with medical treatment, presenting hazards to health   ? Shortness of breath dyspnea   ? Suicidal ideation   ? TB (tuberculosis), treated   ? age 65  ? Vitamin D deficiency   ? ?Significant Hospital Events: ?Including procedures, antibiotic start and stop dates in addition to other pertinent events   ?5/4 - Presented to [facility] ED via EMS for hypotension, hyperglycemia. CT Head negative. CT A/P demonstrated new 4.7 x 4.1cm mass-like hypodensity in the pancreatic head. Empiric cefepime/vanc started. Cx obtained. Peripheral NE started. Insulin gtt. ?5/5 BCx +klebsiella pneumoniae. Abx narrowed to ceftriaxone. Improved glucoses on gtt, transition to SQ insulin. Remains on NE. Possible MRCP/further imaging. ? ?Interim History / Subjective:  ?No significant events overnight ?Mental status grossly unchanged ?Remains on NE 4-24mg ?BP slightly improved with NE/fluid resuscitation ?LA downtrending, not yet normalized ?BCx positive for klebsiella pneumoniae ?Abx narrowed to ceftriaxone ?CT as above, may need alternate imaging for characterization ?Transition off of insulin gtt ?Nutrition consulted ? ?Objective:  ?Blood pressure (!) 113/56, pulse 82, temperature 98.3 ?F (36.8 ?C), temperature source Axillary, resp. rate 17, SpO2 98 %. ?   ?   ? ?Intake/Output Summary (Last 24 hours) at 06/01/2021 0729 ?Last data filed at 06/01/2021 0600 ?Gross per 24 hour  ?Intake 1863.63 ml  ?Output 525 ml  ?  Net 1338.63 ml  ? ?There were no vitals filed for this visit. ? ?Physical Examination: ?General: Acute-on-chronically ill-appearing elderly woman in NAD. ?HEENT: [place]/AT, anicteric sclera, PERRL, dry mucous membranes. ?Neuro: Lethargic. Responds to noxious  stimuli. Not following commands. Moves all 4 extremities spontaneously. +Cough and +Gag  ?CV: RRR, no m/g/r. ?PULM: Breathing even and unlabored on RA. Lung fields CTAB, diminished at bilateral bases. ?GI: Soft, nontender, nondistended. Normoactive bowel sounds. ?Extremities: No LE edema noted. ?Skin: Warm/dry, no rashes. ? ?Resolved Hospital Problem List:  ? ? ?Assessment & Plan:  ?Septic shock ?Klebsiella bacteremia ?Admitted to ICU for shock, sepsis. BCx 5/4  +klebsiella pneumoniae. ?- Goal MAP > 65 ?- Aggressive fluid resuscitation ?- Levophed titrated to goal MAP ?- Trend CBC, fever curve ?- Trend LA to normal, PCT elevated ?- F/u finalized Cx data ?- Antibiotics narrowed to ceftriaxone 5/4PM ?- Low threshold to re-broaden if no improvement ? ?New pancreatic head hypodensity/mass ?CT A/P 5/4 demonstrated new 4.7 x 4.1cm mass-like hypodensity in the pancreatic head, c/f pancreatic pseudocyst vs. walled-off necrosis vs. contained duodenal perf, less likely neoplasm in the setting of rapid development since 01/2021. ?- Amylase/lipase unremarkable ?- MRCP with contrast for better characterization of mass (once able to follow commands for study) vs. CT pancreas protocol vs. ERCP (less favorable) ? ?Hyperglycemia ?T2DM ?Home regimen includes: Insulin NPH 18U QAM, 5U QPM if BG > 300; Victoza 0.'6mg'$  daily ?- Transition insulin gtt to SQ today, 5/5 ?- Semglee 10U Q24H ?- SSI ?- CBGs Q4H, goal 140-180 ?- F/u A1C ?- Diabetes management team consult ? ?Hypertension ?CAD ?HLD ?- Hold home medications for now in the setting of hypotension ? ?History of COPD/asthma ?- Continue supplemental O2 support ?- Wean FiO2 for O2 sat > 90% ?- Bronchodilators PRN ?- Pulmonary hygiene ? ?AKI likely secondary to volume losses ?CKD stage III ?- Trend BMP ?- Replete electrolytes as indicated ?- Monitor I&Os ?- Avoid nephrotoxic agents as able ?- Ensure adequate renal perfusion ? ?Dementia ?History of stroke (remote, ~15 years ago) ?- Frequent  neuro exams/mental status exams ?- Resume home Namenda, rivastigmine as clinically appropriate ? ?Best Practice: (right click and "Reselect all SmartList Selections" daily)  ? ?Diet/type: NPO ?DVT prophylaxis: prophylactic heparin  ?GI prophylaxis: PPI ?Lines: N/A ?Foley:  N/A ?Code Status:  full code ?Last date of multidisciplinary goals of care discussion [Discussed with daughter at bedside 5/5, she confirms full code] ? ?Critical care time: 37 minutes  ? ?Lestine Mount, PA-C ?[facility] ?06/01/21 7:29 AM ? ?Please see [email or web address] for pager details. ? ?From 7A-7P if no response, please call [phone] ?After hours, please call ELink [phone] ?

## 2021-06-01 NOTE — Progress Notes (Signed)
PHARMACY - PHYSICIAN COMMUNICATION ?CRITICAL VALUE ALERT - BLOOD CULTURE IDENTIFICATION (BCID) ? ?Courtney Goodman is an 76 y.o. female who presented to [facility] on 05/31/2021 with a chief complaint of AMS and dehydration, septic shock ? ?Assessment:   ?2/2 blood cultures growing Klebsiella pneumoniae ? ?Name of physician (or Provider) Contacted:  ?Dr. Oletta Darter ? ?Current antibiotics:  ?Vancomycin and Cefepime  ? ?Changes to prescribed antibiotics recommended:  ?Recommend narrowing antibiotics to Rocephin 2 g IV q24h ? ?Results for orders placed or performed during the hospital encounter of 05/31/21  ?Blood Culture ID Panel (Reflexed) (Collected: 05/31/2021 10:42 AM)  ?Result Value Ref Range  ? Enterococcus faecalis NOT DETECTED NOT DETECTED  ? Enterococcus Faecium NOT DETECTED NOT DETECTED  ? Listeria monocytogenes NOT DETECTED NOT DETECTED  ? Staphylococcus species NOT DETECTED NOT DETECTED  ? Staphylococcus aureus (BCID) NOT DETECTED NOT DETECTED  ? Staphylococcus epidermidis NOT DETECTED NOT DETECTED  ? Staphylococcus lugdunensis NOT DETECTED NOT DETECTED  ? Streptococcus species NOT DETECTED NOT DETECTED  ? Streptococcus agalactiae NOT DETECTED NOT DETECTED  ? Streptococcus pneumoniae NOT DETECTED NOT DETECTED  ? Streptococcus pyogenes NOT DETECTED NOT DETECTED  ? A.calcoaceticus-baumannii NOT DETECTED NOT DETECTED  ? Bacteroides fragilis NOT DETECTED NOT DETECTED  ? Enterobacterales DETECTED (A) NOT DETECTED  ? Enterobacter cloacae complex NOT DETECTED NOT DETECTED  ? Escherichia coli NOT DETECTED NOT DETECTED  ? Klebsiella aerogenes NOT DETECTED NOT DETECTED  ? Klebsiella oxytoca NOT DETECTED NOT DETECTED  ? Klebsiella pneumoniae DETECTED (A) NOT DETECTED  ? Proteus species NOT DETECTED NOT DETECTED  ? Salmonella species NOT DETECTED NOT DETECTED  ? Serratia marcescens NOT DETECTED NOT DETECTED  ? Haemophilus influenzae NOT DETECTED NOT DETECTED  ? Neisseria meningitidis NOT DETECTED NOT DETECTED  ? Pseudomonas  aeruginosa NOT DETECTED NOT DETECTED  ? Stenotrophomonas maltophilia NOT DETECTED NOT DETECTED  ? Candida albicans NOT DETECTED NOT DETECTED  ? Candida auris NOT DETECTED NOT DETECTED  ? Candida glabrata NOT DETECTED NOT DETECTED  ? Candida krusei NOT DETECTED NOT DETECTED  ? Candida parapsilosis NOT DETECTED NOT DETECTED  ? Candida tropicalis NOT DETECTED NOT DETECTED  ? Cryptococcus neoformans/gattii NOT DETECTED NOT DETECTED  ? CTX-M ESBL NOT DETECTED NOT DETECTED  ? Carbapenem resistance IMP NOT DETECTED NOT DETECTED  ? Carbapenem resistance KPC NOT DETECTED NOT DETECTED  ? Carbapenem resistance NDM NOT DETECTED NOT DETECTED  ? Carbapenem resist OXA 48 LIKE NOT DETECTED NOT DETECTED  ? Carbapenem resistance VIM NOT DETECTED NOT DETECTED  ? ? ?Kemonie Cutillo, [place] ?06/01/2021  4:14 AM ? ?

## 2021-06-01 NOTE — Progress Notes (Signed)
Initial Nutrition Assessment ? ?DOCUMENTATION CODES:  ? ?Severe malnutrition in context of chronic illness ? ?INTERVENTION:  ? ?If able to rule out duodenal perforation, recommend Cortrak NG tube placement and initiation of enteral nutrition given severe malnutrition (if within [place]): ?- Recommend starting Osmolite 1.2 @ 20 ml/hr and advancing by 10 ml q 8 hours to goal rate of 60 ml/hr (1440 ml/day) ? ?Recommended tube feeding regimen at goal rate would provide 1728 kcal, 80 grams of protein, and 1181 ml of H2O. ? ?- If pt unable to receive enteral nutrition or have diet advanced due to duodenal perforation, recommend initiation of TPN due to severe malnutrition ? ?NUTRITION DIAGNOSIS:  ? ?Severe Malnutrition related to chronic illness (dementia, COPD) as evidenced by moderate fat depletion, severe muscle depletion, percent weight loss (10.8% weight loss in less than 4 months). ? ?GOAL:  ? ?Patient will meet greater than or equal to 90% of their needs ? ?MONITOR:  ? ?Diet advancement, Labs, Weight trends ? ?REASON FOR ASSESSMENT:  ? ?Consult ?Assessment of nutrition requirement/status ? ?ASSESSMENT:  ? ?76 year old female who presented to the ED on 5/04 with hypotension, AMS. PMH of dementia, T2DM, HLD, HTN, CKD stage III, CAD, CVA, COPD, anxiety, depression, anemia. Pt admitted with septic shock, new pancreatic head hypodensity/mass. ? ?05/04 - CT abdomen/pelvis showing new mass-like hypodensity in the pancreatic head and concern for pancreatic pseudocyst vs walled-off necrosis vs contained duodenal perf ? ?Discussed pt with RN. Pt with septic shock due to Klebsiella pneumonia bacteremia, presumed abdominal source. Per CCM note, will need additional abdominal imaging including MRCP with contrast. However this cannot be completed until pt is able to participate with exam and encephalopathy improves. ? ?Reviewed weight history in chart. Pt with a 7 kg weight loss since 02/19/21. This is a 10.8% weight loss in less  than 4 months which is severe and significant for timeframe. Based on NFPE and weight loss, pt meets criteria for severe chronic malnutrition. ? ?Discussed pt with CCM PA. RD recommended Cortrak placement and initiation of enteral nutrition if duodenal perforation had been ruled out. Per CCM PA, no plan for Cortrak at this point until duodenal perforation has been definitively ruled out which will require additional imaging. Pt still too altered for swallow evaluation for diet advancement. ? ?RD to leave tube feeding recommendations for use if enteral access is able to be obtained. If unable to tube feeds or diet unable to be advanced due to duodenal perforation, recommend considering TPN as pt is severely malnourished. ? ?Medications reviewed and include: SSI q 4 hours, semglee 10 units daily, IV abx, levophed ?IVF: LR @ 125 ml/hr ? ?Labs reviewed: sodium 133, BUN 66, creatinine 3.85, magnesium 1.6 (replacement orders in), WBC 32.9, hemoglobin 9.3 ?CBG's: 156-215 x 24 hours ? ?UOP: 525 ml x 24 hours ?I/O's: +2.5 L since admit ? ?NUTRITION - FOCUSED PHYSICAL EXAM: ? ?Flowsheet Row Most Recent Value  ?Orbital Region Moderate depletion  ?Upper Arm Region Moderate depletion  ?Thoracic and Lumbar Region Moderate depletion  ?Buccal Region Moderate depletion  ?Temple Region Mild depletion  ?Clavicle Bone Region Severe depletion  ?Clavicle and Acromion Bone Region Severe depletion  ?Scapular Bone Region Moderate depletion  ?Dorsal Hand Moderate depletion  ?Patellar Region Moderate depletion  ?Anterior Thigh Region Severe depletion  ?Posterior Calf Region Moderate depletion  ?Edema (RD Assessment) None  ?Hair Reviewed  ?Eyes Reviewed  ?Mouth Reviewed  ?Skin Reviewed  ?Nails Reviewed  ? ?  ? ? ?Diet Order:   ?  Diet Order   ? ?       ?  Diet NPO time specified  Diet effective now       ?  ? ?  ?  ? ?  ? ? ?EDUCATION NEEDS:  ? ?Not appropriate for education at this time ? ?Skin:  Skin Assessment: Reviewed RN Assessment ? ?Last  BM:  05/31/21 large type 4 ? ?Height:  ? ?Ht Readings from Last 1 Encounters:  ?02/04/21 '5\' 3"'$  (1.6 m)  ? ? ?Weight:  ? ?Wt Readings from Last 1 Encounters:  ?06/01/21 57.6 kg  ? ? ?BMI:  Body mass index is 22.49 kg/m?.  ? ?Estimated Nutritional Needs:  ? ?Kcal:  1650-1850 ? ?Protein:  75-90 grams ? ?Fluid:  1.6-1.8 L ? ? ? ?Gustavus Bryant, MS, RD, LDN ?Inpatient Clinical Dietitian ?Please see AMiON for contact information. ? ?

## 2021-06-02 ENCOUNTER — Inpatient Hospital Stay (HOSPITAL_COMMUNITY): Payer: Medicare HMO

## 2021-06-02 DIAGNOSIS — A419 Sepsis, unspecified organism: Secondary | ICD-10-CM | POA: Diagnosis not present

## 2021-06-02 DIAGNOSIS — R4182 Altered mental status, unspecified: Secondary | ICD-10-CM | POA: Diagnosis not present

## 2021-06-02 DIAGNOSIS — E43 Unspecified severe protein-calorie malnutrition: Secondary | ICD-10-CM | POA: Insufficient documentation

## 2021-06-02 LAB — CBC
HCT: 25.4 % — ABNORMAL LOW (ref 36.0–46.0)
Hemoglobin: 8.9 g/dL — ABNORMAL LOW (ref 12.0–15.0)
MCH: 30.7 pg (ref 26.0–34.0)
MCHC: 35 g/dL (ref 30.0–36.0)
MCV: 87.6 fL (ref 80.0–100.0)
Platelets: 97 10*3/uL — ABNORMAL LOW (ref 150–400)
RBC: 2.9 MIL/uL — ABNORMAL LOW (ref 3.87–5.11)
RDW: 15.9 % — ABNORMAL HIGH (ref 11.5–15.5)
WBC: 31.1 10*3/uL — ABNORMAL HIGH (ref 4.0–10.5)
nRBC: 0.1 % (ref 0.0–0.2)

## 2021-06-02 LAB — DIC (DISSEMINATED INTRAVASCULAR COAGULATION)PANEL
D-Dimer, Quant: 3.11 ug/mL-FEU — ABNORMAL HIGH (ref 0.00–0.50)
Fibrinogen: >800 mg/dL — ABNORMAL HIGH (ref 210–475)
INR: 1.5 — ABNORMAL HIGH (ref 0.8–1.2)
Platelets: 98 10*3/uL — ABNORMAL LOW (ref 150–400)
Prothrombin Time: 17.6 seconds — ABNORMAL HIGH (ref 11.4–15.2)
aPTT: 50 seconds — ABNORMAL HIGH (ref 24–36)

## 2021-06-02 LAB — PHOSPHORUS: Phosphorus: 4.7 mg/dL — ABNORMAL HIGH (ref 2.5–4.6)

## 2021-06-02 LAB — HEMOGLOBIN A1C
Hgb A1c MFr Bld: 10.8 % — ABNORMAL HIGH (ref 4.8–5.6)
Mean Plasma Glucose: 263 mg/dL

## 2021-06-02 LAB — GLUCOSE, CAPILLARY
Glucose-Capillary: 144 mg/dL — ABNORMAL HIGH (ref 70–99)
Glucose-Capillary: 170 mg/dL — ABNORMAL HIGH (ref 70–99)
Glucose-Capillary: 168 mg/dL — ABNORMAL HIGH (ref 70–99)
Glucose-Capillary: 150 mg/dL — ABNORMAL HIGH (ref 70–99)
Glucose-Capillary: 130 mg/dL — ABNORMAL HIGH (ref 70–99)

## 2021-06-02 LAB — BASIC METABOLIC PANEL
Anion gap: 13 (ref 5–15)
BUN: 63 mg/dL — ABNORMAL HIGH (ref 8–23)
CO2: 19 mmol/L — ABNORMAL LOW (ref 22–32)
Calcium: 8.3 mg/dL — ABNORMAL LOW (ref 8.9–10.3)
Chloride: 105 mmol/L (ref 98–111)
Creatinine, Ser: 3.24 mg/dL — ABNORMAL HIGH (ref 0.44–1.00)
GFR, Estimated: 14 mL/min — ABNORMAL LOW (ref >60)
Glucose, Bld: 144 mg/dL — ABNORMAL HIGH (ref 70–99)
Potassium: 3.7 mmol/L (ref 3.5–5.1)
Sodium: 137 mmol/L (ref 135–145)

## 2021-06-02 LAB — MAGNESIUM: Magnesium: 2.2 mg/dL (ref 1.7–2.4)

## 2021-06-02 LAB — RESP PANEL BY RT-PCR (FLU A&B, COVID) ARPGX2
Influenza A by PCR: NEGATIVE
Influenza B by PCR: NEGATIVE
SARS Coronavirus 2 by RT PCR: NEGATIVE

## 2021-06-02 NOTE — Progress Notes (Signed)
MD secure chatted about pt's orders, IVF d/c and NPO. Awaiting for MD's orders. ?

## 2021-06-02 NOTE — Progress Notes (Signed)
? ?NAME:  Courtney Goodman, MRN:  [id number], DOB:  Aug 10, 1945, LOS: 2 ?ADMISSION DATE:  05/31/2021 CONSULTATION DATE:  05/31/2021 ?REFERRING MD:  Zenia Resides - EDP CHIEF COMPLAINT:  AMS, sepsis ? ?History of Present Illness:  ?76 year old woman who presented to [facility] ED via EMS for AMS x 1 day, hyperglycemia to 500s and concern for sepsis. PMHx significant for HTN, HLD, CAD, CVA (~15 years ago), asthma/COPD, T2DM, CKD stage III, anxiety/depression. ? ?History obtained from daughter (at bedside). She reports that patient began acting confused yesterday and that blood glucoses were measuring higher than normal (300s as compared to 250s-260s). She was noted to be hypotensive today with SBP 70s and EMS was called. Daughter denies c/o fevers/chills, CP/SOB, n/v/d, tends toward constipation. Denies poor PO intake (drinks "8 bottles of water" daily). No significant changes in diet, no c/d abdominal pain or swelling. ? ?On ED arrival, patient was afebrile with HR 60s-80s, SBP 70s-80s and normal RR, sats 98-100% on 2LNC. Labs were notable for WBC 35, H&H 9.4/29.0, Na 129, K 5.1, BUN/Cr 63/4.26 (baseliine 2.0-2.4), Glucose 357, ASTALT 69/26.  IV fluid resuscitation was initiated for possible sepsis and peripheral Levophed was initiated. UA/UCx and BCx were ordered. Broad-spectrum abx were initiated. CT Head NAICA. CT A/P demonstrated new 4.7 x 4.1cm mass-like hypodensity in the pancreatic head, c/f pancreatic pseudocyst vs. walled-off necrosis vs. contained duodenal perf, less likely neoplasm. ? ?PCCM consulted for ICU admission in the setting of hypotension, pressor needs, possible developing sepsis. ? ?Pertinent Medical History:  ? ?Past Medical History:  ?Diagnosis Date  ? Anemia of chronic disease   ? Asthma   ? Atherosclerotic heart disease of native coronary artery without angina pectoris   ? CKD (chronic kidney disease)   ? COPD (chronic obstructive pulmonary disease) ([place])   ? DM (diabetes mellitus), type 2 with renal  complications ([place])   ? Dysrhythmia   ? Elevated ferritin 01/03/2017  ? GAD (generalized anxiety disorder)   ? Hypertension   ? Mixed hyperlipidemia due to type 2 diabetes mellitus ([place])   ? Mixed incontinence   ? per medical records from [place]  ? Osteopenia   ? Personal history of noncompliance with medical treatment, presenting hazards to health   ? Shortness of breath dyspnea   ? Suicidal ideation   ? TB (tuberculosis), treated   ? age 30  ? Vitamin D deficiency   ? ? ?Significant Hospital Events: ?Including procedures, antibiotic start and stop dates in addition to other pertinent events   ?5/4 - Presented to [facility] ED via EMS for hypotension, hyperglycemia. CT Head negative. CT A/P demonstrated new 4.7 x 4.1cm mass-like hypodensity in the pancreatic head. Empiric cefepime/vanc started. Cx obtained. Peripheral NE started. Insulin gtt. ?5/5 BCx +klebsiella pneumoniae. Abx narrowed to ceftriaxone. Improved glucoses on gtt, transition to SQ insulin. Remains on NE. Possible MRCP/further imaging. ? ?Interim History / Subjective:  ? ?Tube feeds started 5/5 ?Norepinephrine weaned to off ?Insulin drip off ?On room air ?I/O+ 4.2 L total ?WBC 31.1 ?Platelets 97 < 198 ?S Cr improving > 3.24; UOP 925cc/24h ? ? ?Objective:  ?Blood pressure 98/62, pulse 69, temperature 97.8 ?F (36.6 ?C), temperature source Axillary, resp. rate 17, weight 57.6 kg, SpO2 97 %. ?   ?   ? ?Intake/Output Summary (Last 24 hours) at 06/02/2021 0924 ?Last data filed at 06/02/2021 0600 ?Gross per 24 hour  ?Intake 2763.08 ml  ?Output 915 ml  ?Net 1848.08 ml  ? ?Filed Weights  ? 06/01/21 1117  ?  Weight: 57.6 kg  ? ? ?Physical Examination: ?General: Chronically ill-appearing elderly woman in no distress ?HEENT: Oropharynx clear, pupils equal ?Neuro: She wakes to voice.  Tracks.  Nods to questions.  She is tearful with interaction.  She is able to phonate, answers simple question. ?CV: Regular, distant, no murmur ?PULM: Clear bilaterally ?GI: Nondistended,  nontender ?Extremities: No lower extremity edema ?Skin: No rash ? ? ?Resolved Hospital Problem List:  ? ? ?Assessment & Plan:  ?Septic shock ?Klebsiella bacteremia ?Admitted to ICU for shock, sepsis. BCx 5/4  +klebsiella pneumoniae. ?-Norepinephrine weaned to off ?-Continue ceftriaxone, broaden if any evidence decompensation ?-KVO IV fluids ?-Follow culture data to completion ? ?New pancreatic head hypodensity/mass ?CT A/P 5/4 demonstrated new 4.7 x 4.1cm mass-like hypodensity in the pancreatic head, c/f pancreatic pseudocyst vs. walled-off necrosis vs. contained duodenal perf, less likely neoplasm in the setting of rapid development since 01/2021. ?- Amylase/lipase unremarkable ?-MRCP with contrast recommended once patient is able to follow commands to participate.  Or consider CT abdomen pancreas protocol.  Doubt she is a candidate currently for ERCP ? ? ?Hyperglycemia ?T2DM ?Home regimen includes: Insulin NPH 18U QAM, 5U QPM if BG > 300; Victoza 0.'6mg'$  daily ?-Insulin drip off, Semglee 10 units every 24 hours ?-Sliding scale insulin ? ?Hypertension ?CAD ?HLD ?-Antihypertensives currently on hold.  Restart when blood pressure will allow ? ?History of COPD/asthma ?-O2 weaned to off ?-Does not appear to be on scheduled bronchodilator regimen, albuterol available as needed ?-Push pulmonary hygiene ? ?AKI likely secondary to volume losses ?CKD stage III ?-Follow urine output and BMP, improving with treatment of her septic shock ?-Avoid nephrotoxins ? ?Thrombocytopenia, suspect due to sepsis ?-Hold heparin, change prophylaxis to SCD ?-DIC panel, consider HIT antibody panel if DIC panel unremarkable ? ?Dementia ?History of stroke (remote, ~15 years ago) ?-Frequent neuro exams ?-Reverse underlying contributors to encephalopathy, sepsis ?-Resume home Namenda and rivastigmine when able to tolerate ? ? ?Best Practice: (right click and "Reselect all SmartList Selections" daily)  ? ?Diet/type: NPO ?DVT prophylaxis: prophylactic  heparin  ?GI prophylaxis: PPI ?Lines: N/A ?Foley:  N/A ?Code Status:  full code ?Last date of multidisciplinary goals of care discussion [Discussed with daughter at bedside 5/5 and 5/6, she confirms full code] ? ? ?Critical care time: 33 minutes  ? ? ?Baltazar Apo, MD, PhD ?06/02/2021, 9:25 AM ?[facility] ?[phone] or if no answer before 7:00PM call [phone] ?For any issues after 7:00PM please call eLink [phone] ? ?

## 2021-06-03 DIAGNOSIS — N184 Chronic kidney disease, stage 4 (severe): Secondary | ICD-10-CM

## 2021-06-03 DIAGNOSIS — A414 Sepsis due to anaerobes: Secondary | ICD-10-CM | POA: Diagnosis not present

## 2021-06-03 DIAGNOSIS — K8689 Other specified diseases of pancreas: Secondary | ICD-10-CM

## 2021-06-03 DIAGNOSIS — G9341 Metabolic encephalopathy: Secondary | ICD-10-CM | POA: Diagnosis not present

## 2021-06-03 DIAGNOSIS — R6521 Severe sepsis with septic shock: Secondary | ICD-10-CM

## 2021-06-03 DIAGNOSIS — N179 Acute kidney failure, unspecified: Secondary | ICD-10-CM | POA: Diagnosis not present

## 2021-06-03 LAB — CBC
HCT: 24.8 % — ABNORMAL LOW (ref 36.0–46.0)
Hemoglobin: 8.8 g/dL — ABNORMAL LOW (ref 12.0–15.0)
MCH: 30.2 pg (ref 26.0–34.0)
MCHC: 35.5 g/dL (ref 30.0–36.0)
MCV: 85.2 fL (ref 80.0–100.0)
Platelets: 75 10*3/uL — ABNORMAL LOW (ref 150–400)
RBC: 2.91 MIL/uL — ABNORMAL LOW (ref 3.87–5.11)
RDW: 15.9 % — ABNORMAL HIGH (ref 11.5–15.5)
WBC: 26.5 10*3/uL — ABNORMAL HIGH (ref 4.0–10.5)
nRBC: 0 % (ref 0.0–0.2)

## 2021-06-03 LAB — BASIC METABOLIC PANEL
Anion gap: 10 (ref 5–15)
BUN: 57 mg/dL — ABNORMAL HIGH (ref 8–23)
CO2: 23 mmol/L (ref 22–32)
Calcium: 8.9 mg/dL (ref 8.9–10.3)
Chloride: 110 mmol/L (ref 98–111)
Creatinine, Ser: 2.41 mg/dL — ABNORMAL HIGH (ref 0.44–1.00)
GFR, Estimated: 20 mL/min — ABNORMAL LOW (ref >60)
Glucose, Bld: 109 mg/dL — ABNORMAL HIGH (ref 70–99)
Potassium: 3 mmol/L — ABNORMAL LOW (ref 3.5–5.1)
Sodium: 143 mmol/L (ref 135–145)

## 2021-06-03 LAB — GLUCOSE, CAPILLARY
Glucose-Capillary: 101 mg/dL — ABNORMAL HIGH (ref 70–99)
Glucose-Capillary: 107 mg/dL — ABNORMAL HIGH (ref 70–99)
Glucose-Capillary: 130 mg/dL — ABNORMAL HIGH (ref 70–99)
Glucose-Capillary: 130 mg/dL — ABNORMAL HIGH (ref 70–99)
Glucose-Capillary: 276 mg/dL — ABNORMAL HIGH (ref 70–99)
Glucose-Capillary: 236 mg/dL — ABNORMAL HIGH (ref 70–99)

## 2021-06-03 LAB — CULTURE, BLOOD (ROUTINE X 2)

## 2021-06-03 MED ORDER — FENOFIBRATE 54 MG PO TABS
54.0000 mg | ORAL_TABLET | Freq: Every day | ORAL | Status: DC
Start: 2021-06-03 — End: 2021-06-06
  Administered 2021-06-03 – 2021-06-06 (×4): 54 mg via ORAL
  Filled 2021-06-03 (×4): qty 1

## 2021-06-03 MED ORDER — ORAL CARE MOUTH RINSE
15.0000 mL | Freq: Two times a day (BID) | OROMUCOSAL | Status: DC
  Administered 2021-06-03 – 2021-06-06 (×6): 15 mL via OROMUCOSAL

## 2021-06-03 MED ORDER — POTASSIUM CHLORIDE CRYS ER 20 MEQ PO TBCR
40.0000 meq | EXTENDED_RELEASE_TABLET | ORAL | Status: AC
  Administered 2021-06-03 (×2): 40 meq via ORAL
  Filled 2021-06-03 (×2): qty 2

## 2021-06-03 MED ORDER — CARVEDILOL 6.25 MG PO TABS
6.2500 mg | ORAL_TABLET | Freq: Two times a day (BID) | ORAL | Status: DC
Start: 2021-06-03 — End: 2021-06-06
  Administered 2021-06-03 – 2021-06-06 (×6): 6.25 mg via ORAL
  Filled 2021-06-03 (×7): qty 1

## 2021-06-03 MED ORDER — SIMVASTATIN 20 MG PO TABS
20.0000 mg | ORAL_TABLET | Freq: Every day | ORAL | Status: DC
  Administered 2021-06-03 – 2021-06-05 (×3): 20 mg via ORAL
  Filled 2021-06-03 (×3): qty 1

## 2021-06-03 MED ORDER — PANTOPRAZOLE SODIUM 40 MG PO TBEC
40.0000 mg | DELAYED_RELEASE_TABLET | Freq: Every day | ORAL | Status: DC
  Administered 2021-06-03 – 2021-06-06 (×4): 40 mg via ORAL
  Filled 2021-06-03 (×4): qty 1

## 2021-06-03 NOTE — Progress Notes (Addendum)
?PROGRESS NOTE ? ? ? ?Courtney Goodman  OFB:[id number] DOB: 10/16/1945 DOA: 05/31/2021 ?PCP: [name], PA-C ? ? ?Brief Narrative:  ?76 year old woman who presented to [facility] ED via EMS for AMS x 1 day, hyperglycemia to 500s and concern for sepsis. PMHx significant for HTN, HLD, CAD, CVA (~15 years ago), asthma/COPD, T2DM, CKD stage III, anxiety/depression. ?  ?History obtained from daughter (at bedside). She reports that patient began acting confused yesterday and that blood glucoses were measuring higher than normal (300s as compared to 250s-260s). She was noted to be hypotensive today with SBP 70s and EMS was called. Daughter denies c/o fevers/chills, CP/SOB, n/v/d, tends toward constipation. Denies poor PO intake (drinks "8 bottles of water" daily). No significant changes in diet, no c/d abdominal pain or swelling. ?  ?On ED arrival, patient was afebrile with HR 60s-80s, SBP 70s-80s and normal RR, sats 98-100% on 2LNC. Labs were notable for WBC 35, H&H 9.4/29.0, Na 129, K 5.1, BUN/Cr 63/4.26 (baseliine 2.0-2.4), Glucose 357, ASTALT 69/26.  IV fluid resuscitation was initiated for possible sepsis and peripheral Levophed was initiated. UA/UCx and BCx were ordered. Broad-spectrum abx were initiated. CT Head NAICA. CT A/P demonstrated new 4.7 x 4.1cm mass-like hypodensity in the pancreatic head, c/f pancreatic pseudocyst vs. walled-off necrosis vs. contained duodenal perf, less likely neoplasm. ?  ?PCCM consulted for ICU admission in the setting of hypotension, pressor needs, possible developing sepsis. ? ?Patient transferred under [place] on 06/02/2021. ? ?Significant Hospital Events: ?Including procedures, antibiotic start and stop dates in addition to other pertinent events   ?5/4 - Presented to [facility] ED via EMS for hypotension, hyperglycemia. CT Head negative. CT A/P demonstrated new 4.7 x 4.1cm mass-like hypodensity in the pancreatic head. Empiric cefepime/vanc started. Cx obtained. Peripheral NE started. Insulin  gtt. ?5/5 BCx +klebsiella pneumoniae. Abx narrowed to ceftriaxone. Improved glucoses on gtt, transition to SQ insulin. Remains on NE. Possible MRCP/further imaging. ?Assessment & Plan: ?  ?Active Problems: ?  Uncontrolled type 2 diabetes mellitus with hyperglycemia, with long-term current use of insulin ([place]) ?  Acute metabolic encephalopathy ?  Acute renal failure superimposed on stage 4 chronic kidney disease ([place]) ?  Protein-calorie malnutrition, severe ?  Septic shock due to Klebsiella pneumoniae [facility]) ?  Pancreatic mass ? ?Septic shock secondary to Klebsiella pneumonia bacteremia: Source of infection still remains unclear, sensitivities still pending, hemodynamically stable, continue Rocephin and follow sensitivities and tailor antibiotics accordingly. ?  ?New pancreatic head hypodensity/mass ?CT A/P 5/4 demonstrated new 4.7 x 4.1cm mass-like hypodensity in the pancreatic head, c/f pancreatic pseudocyst vs. walled-off necrosis vs. contained duodenal perf, less likely neoplasm in the setting of rapid development since 01/2021. ?- Amylase/lipase unremarkable ?-MRCP with contrast recommended once patient is able to follow commands to participate.  Or consider CT abdomen pancreas protocol.  ? ?Uncontrolled type 2 diabetes mellitus with hyperglycemia: ?Home regimen includes: Insulin NPH 18U QAM, 5U QPM if BG > 300; Victoza 0.'6mg'$  daily: Currently on Semglee 10 units and SSI, blood sugar controlled. ?  ?Hypertension, essential: Now that her blood pressure is rising, I will resume her Coreg. ? ?CAD: Stable.  Holding aspirin due to thrombocytopenia. ? ?HLD: Resume home medications. ? ?History of COPD/asthma ?-O2 weaned to off ?-Does not appear to be on scheduled bronchodilator regimen, albuterol available as needed ?  ?AKI on CKD stage IV: Her baseline creatinine appears to be around 2.2, she presented with 4.2, now renal function back at baseline. ?  ?DIC: Patient developed acute thrombocytopenia.  Has chronic anemia  which is stable.  DIC panel indicates that she possibly has acute DIC.  Thankfully, she is not bleeding at this point in time.  Her DVT prophylaxis was also switched from chemical to only SCD.  Platelets are stable.  Monitor closely.  Treating underlying cause which is likely septic shock.  Hopefully her numbers will improve in the next 2 to 3 days. ?  ?Dementia: ?History of stroke (remote, ~15 years ago).  Per daughter, she has been on Namenda since 6 months.  Patient is alert but confused.  Per daughter, she does have these behaviors intermittently so this is likely her baseline. ? ?Dysphagia: Patient noted to have dysphagia by nursing assessment.  SLP consulted. ? ?Physical deconditioning: We will consult PT OT. ? ?DVT prophylaxis: SCDs Start: 05/31/21 1345 ?  Code Status: Full Code  ?Family Communication:  None present at bedside.  Plan of care discussed with patient's daughter over the phone. ? ?Status is: Inpatient ?Remains inpatient appropriate because: Still sick. ? ? ?Estimated body mass index is 22.49 kg/m? as calculated from the following: ?  Height as of 02/04/21: '5\' 3"'$  (1.6 m). ?  Weight as of this encounter: 57.6 kg. ? ?Pressure Injury 06/02/21 Coccyx Right;Left Deep Tissue Pressure Injury - Purple or maroon localized area of discolored intact skin or blood-filled blister due to damage of underlying soft tissue from pressure and/or shear. (Active)  ?06/02/21 1950  ?Location: Coccyx  ?Location Orientation: Right;Left  ?Staging: Deep Tissue Pressure Injury - Purple or maroon localized area of discolored intact skin or blood-filled blister due to damage of underlying soft tissue from pressure and/or shear.  ?Wound Description (Comments):   ?Present on Admission:   ?Dressing Type Foam - Lift dressing to assess site every shift 06/03/21 0447  ? ?Nutritional Assessment: ?Body mass index is 22.49 kg/m?Marland KitchenMarland Kitchen ?Seen by dietician.  I agree with the assessment and plan as outlined below: ?Nutrition Status: ?Nutrition  Problem: Severe Malnutrition ?Etiology: chronic illness (dementia, COPD) ?Signs/Symptoms: moderate fat depletion, severe muscle depletion, percent weight loss (10.8% weight loss in less than 4 months) ?Percent weight loss: 10.8 % ?Interventions: Refer to RD note for recommendations ? ?. ?Skin Assessment: ?I have examined the patient's skin and I agree with the wound assessment as performed by the wound care RN as outlined below: ?Pressure Injury 06/02/21 Coccyx Right;Left Deep Tissue Pressure Injury - Purple or maroon localized area of discolored intact skin or blood-filled blister due to damage of underlying soft tissue from pressure and/or shear. (Active)  ?06/02/21 1950  ?Location: Coccyx  ?Location Orientation: Right;Left  ?Staging: Deep Tissue Pressure Injury - Purple or maroon localized area of discolored intact skin or blood-filled blister due to damage of underlying soft tissue from pressure and/or shear.  ?Wound Description (Comments):   ?Present on Admission:   ?Dressing Type Foam - Lift dressing to assess site every shift 06/03/21 0447  ? ? ?Consultants:  ?None ? ?Procedures:  ?None ? ?Antimicrobials:  ?Anti-infectives (From admission, onward)  ? ? Start     Dose/Rate Route Frequency Ordered Stop  ? 06/01/21 1000  ceFEPIme (MAXIPIME) 1 g in sodium chloride 0.9 % 100 mL IVPB  Status:  Discontinued       ? 1 g ?200 mL/hr over 30 Minutes Intravenous Every 24 hours 05/31/21 1152 06/01/21 0436  ? 06/01/21 1000  cefTRIAXone (ROCEPHIN) 2 g in sodium chloride 0.9 % 100 mL IVPB       ? 2 g ?200 mL/hr over 30  Minutes Intravenous Every 24 hours 06/01/21 0436    ? 05/31/21 1200  vancomycin (VANCOREADY) IVPB 1500 mg/300 mL       ? 1,500 mg ?150 mL/hr over 120 Minutes Intravenous  Once 05/31/21 1151 05/31/21 1916  ? 05/31/21 1151  vancomycin variable dose per unstable renal function (pharmacist dosing)  Status:  Discontinued       ?  Does not apply See admin instructions 05/31/21 1151 06/01/21 0436  ? 05/31/21 1145   ceFEPIme (MAXIPIME) 2 g in sodium chloride 0.9 % 100 mL IVPB       ? 2 g ?200 mL/hr over 30 Minutes Intravenous  Once 05/31/21 1131 05/31/21 1409  ? 05/31/21 1145  metroNIDAZOLE (FLAGYL) IVPB 500 mg       ? 50

## 2021-06-03 NOTE — Evaluation (Signed)
Occupational Therapy Evaluation ?Patient Details ?Name: Courtney Goodman ?MRN: [id number] ?DOB: [birth year] ?Today's Date: 06/03/2021 ? ? ?History of Present Illness Pt is a 76 y.o. F who presents with hypotension, hyperglycemia. CT head negative. CT A/P demonstrated new 4.7 x 4.1cm mass-like hypodensity in the pancreatic head.  PCCM consulted for ICU admission in the setting of hypotension, pressor needs, possible developing sepsis. Significant PMH: HTN, HLD, CAD, CVA (~15 years ago), asthma/COPD, T2DM, CKD stage III, anxiety/depression.  ? ?Clinical Impression ?  ?Courtney Goodman was evaluated s/p the above admission list, she requires assist for all ADLs at baseline from her daughter who she lives with, and she ambulates with RW. Upon evaluation pt was emotional and follow <10% of functional cues. She required max assist to get to the EOB, and immediately wanted to lay back down saying "no" and crying. Assisted pt back into the bed, and place bed into chair position. Pt required max A for self feeding while supported upright in bed. She currently needs up to max A +2 for Adls. Pt will benefit from OT acutely. Recommend d/c to home with 24/7 direct physical assist from daughter and HHOT. ?   ? ?Recommendations for follow up therapy are one component of a multi-disciplinary discharge planning process, led by the attending physician.  Recommendations may be updated based on patient status, additional functional criteria and insurance authorization.  ? ?Follow Up Recommendations ? Home health OT (Daughter prefers pt d/c home with [facility] services.)  ?  ?Assistance Recommended at Discharge Frequent or constant Supervision/Assistance  ?Patient can return home with the following A lot of help with walking and/or transfers;A lot of help with bathing/dressing/bathroom;Assistance with feeding;Assistance with cooking/housework;Direct supervision/assist for medications management;Assist for transportation;Help with stairs or ramp for entrance ? ?   ?Functional Status Assessment ? Patient has had a recent decline in their functional status and demonstrates the ability to make significant improvements in function in a reasonable and predictable amount of time.  ?Equipment Recommendations ? None recommended by OT  ?  ?Recommendations for Other Services   ? ? ?  ?Precautions / Restrictions Precautions ?Precautions: Fall ?Restrictions ?Weight Bearing Restrictions: No  ? ?  ? ?Mobility Bed Mobility ?Overal bed mobility: Needs Assistance ?Bed Mobility: Supine to Sit, Sit to Supine ?  ?  ?Supine to sit: Max assist ?Sit to supine: Mod assist ?  ?General bed mobility comments: pt resistant and emotional once sitting EOB ?  ? ?Transfers ?  ?  ?  ?  ?  ?  ?  ?  ?  ?  ?  ? ?  ?Balance Overall balance assessment: Needs assistance ?Sitting-balance support: Feet supported ?Sitting balance-Leahy Scale: Fair ?Sitting balance - Comments: immediately attempted to lay back down after sitting EOB ?  ?  ?  ?  ?  ?  ?  ?  ?  ?  ?  ?  ?  ?  ?  ?   ? ?ADL either performed or assessed with clinical judgement  ? ?ADL Overall ADL's : Needs assistance/impaired ?Eating/Feeding: Maximal assistance;Bed level ?Eating/Feeding Details (indicate cue type and reason): supported upright in bed ?Grooming: Maximal assistance;Bed level ?  ?Upper Body Bathing: Maximal assistance;Sitting ?  ?Lower Body Bathing: Maximal assistance;+2 for physical assistance;+2 for safety/equipment;Sit to/from stand ?  ?Upper Body Dressing : Maximal assistance;Sitting ?  ?Lower Body Dressing: Maximal assistance;+2 for physical assistance;+2 for safety/equipment;Sit to/from stand ?  ?Toilet Transfer: Maximal assistance;+2 for physical assistance;+2 for safety/equipment;Stand-pivot ?  ?Toileting- Clothing  Manipulation and Hygiene: Maximal assistance;+2 for physical assistance;+2 for safety/equipment;Sit to/from stand ?  ?  ?  ?Functional mobility during ADLs: Maximal assistance ?General ADL Comments: cognition and affect  limiting pt throughout, emotional and crying. nto following cues, max A t get to EOB. pt saying "no, no, no" once back in supien while supported in bed, assisted with self feeding  ? ? ? ?Vision Baseline Vision/History: 1 Wears glasses ?Ability to See in Adequate Light: 0 Adequate ?Vision Assessment?: No apparent visual deficits  ?   ?Perception   ?  ?Praxis   ?  ? ?Pertinent Vitals/Pain Pain Assessment ?Pain Assessment: Faces ?Faces Pain Scale: Hurts little more ?Pain Location: unsure ? pt emotional throughout session ?Pain Descriptors / Indicators: Crying ?Pain Intervention(s): Monitored during session, Limited activity within patient's tolerance  ? ? ? ?Hand Dominance Right ?  ?Extremity/Trunk Assessment Upper Extremity Assessment ?Upper Extremity Assessment: Generalized weakness ?  ?Lower Extremity Assessment ?Lower Extremity Assessment: Defer to PT evaluation ?  ?  ?  ?Communication Communication ?Communication: No difficulties ?  ?Cognition Arousal/Alertness: Awake/alert ?Behavior During Therapy: Anxious ?Overall Cognitive Status: History of cognitive impairments - at baseline ?  ?  ?  ?  ?  ?  ?  ?  ?  ?  ?  ?  ?  ?  ?  ?  ?General Comments: Hx of dementia. Pt with poor command following. Able to verbalize some basic needs i.e. "I want water." Pt crying intermittently & asking for her daughter. Followed <10% of cues ?  ?  ?General Comments  VSS on RA, assisted wtih some feeding, notified RN that pt will need assistance with meals ? ?  ?Exercises   ?  ?Shoulder Instructions    ? ? ?Home Living Family/patient expects to be discharged to:: Private residence ?Living Arrangements: Children ?Available Help at Discharge: Family;Available 24 hours/day ?Type of Home: House ?Home Access: Stairs to enter ?Entrance Stairs-Number of Steps: 3 ?  ?Home Layout: One level ?  ?  ?Bathroom Shower/Tub: Tub/shower unit ?  ?  ?  ?  ?Home Equipment: Cane - single point;Grab bars - tub/shower;BSC/3in1;Wheelchair - Presenter, broadcasting (2 wheels);Tub bench ?  ?  ?  ? ?  ?Prior Functioning/Environment Prior Level of Function : Needs assist;Patient poor historian/Family not available;History of Falls (last six months) ?  ?  ?  ?  ?  ?  ?Mobility Comments: Pt uses walker for household ambulation, w/c for community mobility ?ADLs Comments: assist for all ADL's ?  ? ?  ?  ?OT Problem List: Decreased strength;Decreased activity tolerance;Decreased range of motion;Impaired balance (sitting and/or standing);Decreased cognition;Decreased safety awareness ?  ?   ?OT Treatment/Interventions: Therapeutic exercise;Self-care/ADL training;DME and/or AE instruction;Patient/family education;Balance training  ?  ?OT Goals(Current goals can be found in the care plan section) Acute Rehab OT Goals ?Patient Stated Goal: to see daughter ?OT Goal Formulation: With patient ?Time For Goal Achievement: 06/17/21 ?Potential to Achieve Goals: Good  ?OT Frequency: Min 2X/week ?  ? ?Co-evaluation   ?  ?  ?  ?  ? ?  ?AM-PAC OT "6 Clicks" Daily Activity     ?Outcome Measure Help from another person eating meals?: A Lot ?Help from another person taking care of personal grooming?: A Lot ?Help from another person toileting, which includes using toliet, bedpan, or urinal?: A Lot ?Help from another person bathing (including washing, rinsing, drying)?: A Lot ?Help from another person to put on and taking off regular upper body clothing?:  A Lot ?Help from another person to put on and taking off regular lower body clothing?: A Lot ?6 Click Score: 12 ?  ?End of Session Nurse Communication: Mobility status ? ?Activity Tolerance: Patient tolerated treatment well ?Patient left: in bed;with call bell/phone within reach;with bed alarm set ? ?OT Visit Diagnosis: Unsteadiness on feet (R26.81);Other abnormalities of gait and mobility (R26.89);Muscle weakness (generalized) (M62.81);History of falling (Z91.81)  ?              ?Time: 1300-1320 ?OT Time Calculation (min): 20 min ?Charges:  OT  General Charges ?$OT Visit: 1 Visit ?OT Evaluation ?$OT Eval Moderate Complexity: 1 Mod ? ?Spence Soberano A Antawan Mchugh ?06/03/2021, 3:36 PM ?

## 2021-06-03 NOTE — Evaluation (Signed)
Physical Therapy Evaluation ?Patient Details ?Name: Courtney Goodman ?MRN: [id number] ?DOB: 09/28/1945 ?Today's Date: 06/03/2021 ? ?History of Present Illness ? Pt is a 76 y.o. F who presents with hypotension, hyperglycemia. CT head negative. CT A/P demonstrated new 4.7 x 4.1cm mass-like hypodensity in the pancreatic head.  PCCM consulted for ICU admission in the setting of hypotension, pressor needs, possible developing sepsis. Significant PMH: HTN, HLD, CAD, CVA (~15 years ago), asthma/COPD, T2DM, CKD stage III, anxiety/depression.  ?Clinical Impression ? PTA, pt lives with her daughter who provides 24/7 assist; pt requires assist for all ADL's and uses a walker for household ambulation. Pt presents with decreased functional mobility secondary to impaired cognition, sitting/standing balance, decreased activity tolerance and generalized weakness. On PT evaluation, pt is alert and verbalizing wanting water. Pt with difficulty following commands; intermittently crying, stating she feels scared and asking where her daughter is. Pt requiring moderate assist for bed mobility and transfers to standing. Deferred transfer to chair as pt requesting to lie back down and resisting. Played gospel music for relaxation. Discussed with pt daughter whose preference is for pt to return home with her. Would benefit from follow up [place]. ?   ? ?Recommendations for follow up therapy are one component of a multi-disciplinary discharge planning process, led by the attending physician.  Recommendations may be updated based on patient status, additional functional criteria and insurance authorization. ? ?Follow Up Recommendations Home health PT ? ?  ?Assistance Recommended at Discharge Frequent or constant Supervision/Assistance  ?Patient can return home with the following ? A lot of help with walking and/or transfers;A lot of help with bathing/dressing/bathroom;Assistance with cooking/housework;Assistance with feeding;Direct supervision/assist  for medications management;Assist for transportation;Help with stairs or ramp for entrance ? ?  ?Equipment Recommendations None recommended by PT (pt well equipped)  ?Recommendations for Other Services ?    ?  ?Functional Status Assessment Patient has had a recent decline in their functional status and demonstrates the ability to make significant improvements in function in a reasonable and predictable amount of time.  ? ?  ?Precautions / Restrictions Precautions ?Precautions: Fall ?Restrictions ?Weight Bearing Restrictions: No  ? ?  ? ?Mobility ? Bed Mobility ?Overal bed mobility: Needs Assistance ?Bed Mobility: Supine to Sit, Sit to Supine ?  ?  ?Supine to sit: Mod assist ?Sit to supine: Mod assist ?  ?General bed mobility comments: Pt able to initiate moving towards right side of bed. Use of bed pad to scoot hips out to edge. Pt needing assist for BLE elevation back into bed. +2 to boost up ?  ? ?Transfers ?Overall transfer level: Needs assistance ?Equipment used: None ?Transfers: Sit to/from Stand ?Sit to Stand: Mod assist ?  ?  ?  ?  ?  ?General transfer comment: Heavy moderate assist to rise to standing from edge of bed. Pt resistive and asking to lie back down so deferred tx to chair ?  ? ?Ambulation/Gait ?  ?  ?  ?  ?  ?  ?  ?General Gait Details: unable ? ?Stairs ?  ?  ?  ?  ?  ? ?Wheelchair Mobility ?  ? ?Modified Rankin (Stroke Patients Only) ?  ? ?  ? ?Balance Overall balance assessment: Needs assistance ?Sitting-balance support: Feet supported ?Sitting balance-Leahy Scale: Fair ?Sitting balance - Comments: No UE support, supervision for safety. Frequently trying to lie back down though, and needs redirection to keep sitting up ?  ?Standing balance support: Bilateral upper extremity supported ?Standing balance-Leahy Scale: Poor ?  ?  ?  ?  ?  ?  ?  ?  ?  ?  ?  ?  ?   ? ? ? ?  Pertinent Vitals/Pain Pain Assessment ?Pain Assessment: Faces ?Faces Pain Scale: No hurt  ? ? ?Home Living Family/patient expects to  be discharged to:: Private residence ?Living Arrangements: Children ?Available Help at Discharge: Family;Available 24 hours/day (daughter) ?Type of Home: House ?Home Access: Stairs to enter ?  ?Entrance Stairs-Number of Steps: 3 ?  ?Home Layout: One level ?Home Equipment: Cane - single point;Grab bars - tub/shower;BSC/3in1;Wheelchair - Publishing copy (2 wheels);Tub bench ?   ?  ?Prior Function Prior Level of Function : Needs assist;Patient poor historian/Family not available;History of Falls (last six months) ?  ?  ?  ?  ?  ?  ?Mobility Comments: Pt uses walker for household ambulation, w/c for community mobility ?ADLs Comments: assist for all ADL's ?  ? ? ?Hand Dominance  ? Dominant Hand: Right ? ?  ?Extremity/Trunk Assessment  ? Upper Extremity Assessment ?Upper Extremity Assessment: Defer to OT evaluation ?  ? ?Lower Extremity Assessment ?Lower Extremity Assessment: Generalized weakness ?  ? ?   ?Communication  ? Communication: No difficulties  ?Cognition Arousal/Alertness: Awake/alert ?Behavior During Therapy: Anxious ?Overall Cognitive Status: History of cognitive impairments - at baseline ?  ?  ?  ?  ?  ?  ?  ?  ?  ?  ?  ?  ?  ?  ?  ?  ?General Comments: Hx of dementia. Pt with poor command following. Able to verbalize some basic needs i.e. "I want water." Pt crying intermittently stating she was scared and asking where her daughter was. Unable to be re-oriented. Played music for relaxation ?  ?  ? ?  ?General Comments   ? ?  ?Exercises    ? ?Assessment/Plan  ?  ?PT Assessment Patient needs continued PT services  ?PT Problem List Decreased strength;Decreased balance;Decreased activity tolerance;Decreased mobility;Decreased cognition;Decreased safety awareness ? ?   ?  ?PT Treatment Interventions DME instruction;Gait training;Stair training;Functional mobility training;Therapeutic activities;Therapeutic exercise;Balance training;Patient/family education   ? ?PT Goals (Current goals can be found in the  Care Plan section)  ?Acute Rehab PT Goals ?Patient Stated Goal: pt daughter wishes to take pt home ?PT Goal Formulation: With patient/family ?Time For Goal Achievement: 06/17/21 ?Potential to Achieve Goals: Fair ? ?  ?Frequency Min 3X/week ?  ? ? ?Co-evaluation   ?  ?  ?  ?  ? ? ?  ?AM-PAC PT "6 Clicks" Mobility  ?Outcome Measure Help needed turning from your back to your side while in a flat bed without using bedrails?: A Little ?Help needed moving from lying on your back to sitting on the side of a flat bed without using bedrails?: A Lot ?Help needed moving to and from a bed to a chair (including a wheelchair)?: A Lot ?Help needed standing up from a chair using your arms (e.g., wheelchair or bedside chair)?: A Lot ?Help needed to walk in hospital room?: Total ?Help needed climbing 3-5 steps with a railing? : Total ?6 Click Score: 11 ? ?  ?End of Session Equipment Utilized During Treatment: Gait belt ?Activity Tolerance: Other (comment) (limited 2/2 cognition) ?Patient left: in bed;with call bell/phone within reach;with bed alarm set ?Nurse Communication: Mobility status ?PT Visit Diagnosis: Unsteadiness on feet (R26.81);Muscle weakness (generalized) (M62.81);Difficulty in walking, not elsewhere classified (R26.2) ?  ? ?Time: 9147-8295 ?PT Time Calculation (min) (ACUTE ONLY): 21 min ? ? ?Charges:   PT Evaluation ?$PT Eval Moderate Complexity: 1 Mod ?  ?  ?   ? ? ?Wyona Almas, PT, DPT ?Acute Rehabilitation Services ?Pager [phone] ?  Office [phone] ? ? ?Deno Etienne ?06/03/2021, 2:30 PM ? ?

## 2021-06-03 NOTE — Evaluation (Signed)
Clinical/Bedside Swallow Evaluation ?Patient Details  ?Name: Courtney Goodman ?MRN: [id number] ?Date of Birth: [birth year] ? ?Today's Date: 06/03/2021 ?Time: SLP Start Time (ACUTE ONLY): 1150 SLP Stop Time (ACUTE ONLY): 1210 ?SLP Time Calculation (min) (ACUTE ONLY): 20 min ? ?Past Medical History:  ?Past Medical History:  ?Diagnosis Date  ? Anemia of chronic disease   ? Asthma   ? Atherosclerotic heart disease of native coronary artery without angina pectoris   ? CKD (chronic kidney disease)   ? COPD (chronic obstructive pulmonary disease) ([place])   ? DM (diabetes mellitus), type 2 with renal complications ([place])   ? Dysrhythmia   ? Elevated ferritin 01/03/2017  ? GAD (generalized anxiety disorder)   ? Hypertension   ? Mixed hyperlipidemia due to type 2 diabetes mellitus ([place])   ? Mixed incontinence   ? per medical records from [place]  ? Osteopenia   ? Personal history of noncompliance with medical treatment, presenting hazards to health   ? Shortness of breath dyspnea   ? Suicidal ideation   ? TB (tuberculosis), treated   ? age 14  ? Vitamin D deficiency   ? ?Past Surgical History:  ?Past Surgical History:  ?Procedure Laterality Date  ? ANKLE RECONSTRUCTION    ? right  ? CARPAL TUNNEL RELEASE    ? CORONARY ANGIOPLASTY    ? ?HPI:  ?Patient is a 76 y.o. female with PMH: dementia, poorly controlled DM, HTN, CAD. She presented to the ED with AMS, severe dehydration and hyperglycemia. In ED she was hypotensive requiring IV Levophed after IV fluid resuscitation. CXR unremarkable and CT head did not show any acute intracranial abnormalities. She was admitted with shock, suspect hypovolemic. She failed [facility] with RN because she was not able to follow even basic commands.  ?  ?Assessment / Plan / Recommendation  ?Clinical Impression ? Patient presents with a suspected cognitive-based oral dysphagia as per this bedside/clinical swallow evaluation. SLP observed patient with cup sips and straw sips of thin liquids (water), puree  (applesauce) and regular solids (saltine crackers). She did not exhibit any overt s/s of aspiration or penetration with liquids or solids and swallow initiation appeared timely. She did exhibit prolonged mastication with solids but full clearance with subsequent swallows as well as sips of liquids. SLP is recommending initiate PO diet of Dys 2 solids and thin liquids and plan to f/u for diet toleration and ability to advance solids. ?SLP Visit Diagnosis: Dysphagia, unspecified (R13.10) ?   ?Aspiration Risk ? No limitations;Mild aspiration risk  ?  ?Diet Recommendation Dysphagia 2 (Fine chop);Thin liquid  ? ?Liquid Administration via: Cup ?Medication Administration: Crushed with puree ?Supervision: Full supervision/cueing for compensatory strategies;Staff to assist with self feeding ?Compensations: Slow rate;Small sips/bites;Minimize environmental distractions ?Postural Changes: Seated upright at 90 degrees  ?  ?Other  Recommendations Oral Care Recommendations: Oral care BID;Staff/trained caregiver to provide oral care   ? ?Recommendations for follow up therapy are one component of a multi-disciplinary discharge planning process, led by the attending physician.  Recommendations may be updated based on patient status, additional functional criteria and insurance authorization. ? ?Follow up Recommendations No SLP follow up  ? ? ?  ?Assistance Recommended at Discharge Frequent or constant Supervision/Assistance  ?Functional Status Assessment Patient has had a recent decline in their functional status and demonstrates the ability to make significant improvements in function in a reasonable and predictable amount of time.  ?Frequency and Duration min 2x/week  ?1 week ?  ?   ? ?Prognosis Prognosis  for Safe Diet Advancement: Good ?Barriers to Reach Goals: Cognitive deficits ?Barriers/Prognosis Comment: h/o dementia  ? ?  ? ?Swallow Study   ?General Date of Onset: 06/03/21 ?HPI: Patient is a 76 y.o. female with PMH:  dementia, poorly controlled DM, HTN, CAD. She presented to the ED with AMS, severe dehydration and hyperglycemia. In ED she was hypotensive requiring IV Levophed after IV fluid resuscitation. CXR unremarkable and CT head did not show any acute intracranial abnormalities. She was admitted with shock, suspect hypovolemic. She failed [facility] with RN because she was not able to follow even basic commands. ?Type of Study: Bedside Swallow Evaluation ?Previous Swallow Assessment: none found ?Diet Prior to this Study: NPO ?Temperature Spikes Noted: No ?Respiratory Status: Room air ?History of Recent Intubation: No ?Behavior/Cognition: Alert;Cooperative;Pleasant mood;Confused ?Oral Cavity Assessment: Within Functional Limits ?Oral Care Completed by SLP: No ?Oral Cavity - Dentition: Adequate natural dentition ?Vision: Functional for self-feeding ?Self-Feeding Abilities: Able to feed self;Needs set up;Needs assist ?Patient Positioning: Upright in bed ?Baseline Vocal Quality: Normal ?Volitional Cough: Cognitively unable to elicit ?Volitional Swallow: Unable to elicit  ?  ?Oral/Motor/Sensory Function Overall Oral Motor/Sensory Function: Within functional limits   ?Ice Chips     ?Thin Liquid Thin Liquid: Within functional limits ?Presentation: Straw;Self Fed;Cup  ?  ?Nectar Thick     ?Honey Thick     ?Puree Puree: Within functional limits ?Other Comments: patient 'drank' applesauce   ?Solid ? ? ?  Solid: Impaired ?Oral Phase Impairments: Impaired mastication  ? ?  ? ?Sonia Baller, MA, CCC-SLP ?Speech Therapy ? ? ? ? ?

## 2021-06-04 ENCOUNTER — Inpatient Hospital Stay (HOSPITAL_COMMUNITY): Payer: Medicare HMO

## 2021-06-04 ENCOUNTER — Other Ambulatory Visit: Payer: Self-pay

## 2021-06-04 DIAGNOSIS — K8689 Other specified diseases of pancreas: Secondary | ICD-10-CM | POA: Diagnosis not present

## 2021-06-04 DIAGNOSIS — Z794 Long term (current) use of insulin: Secondary | ICD-10-CM

## 2021-06-04 DIAGNOSIS — G9341 Metabolic encephalopathy: Secondary | ICD-10-CM | POA: Diagnosis not present

## 2021-06-04 DIAGNOSIS — A414 Sepsis due to anaerobes: Secondary | ICD-10-CM | POA: Diagnosis not present

## 2021-06-04 DIAGNOSIS — E1165 Type 2 diabetes mellitus with hyperglycemia: Secondary | ICD-10-CM

## 2021-06-04 DIAGNOSIS — N183 Chronic kidney disease, stage 3 unspecified: Secondary | ICD-10-CM

## 2021-06-04 LAB — CBC WITH DIFFERENTIAL/PLATELET
Abs Immature Granulocytes: 0.7 10*3/uL — ABNORMAL HIGH (ref 0.00–0.07)
Basophils Absolute: 0.1 10*3/uL (ref 0.0–0.1)
Basophils Relative: 0 %
Eosinophils Absolute: 0.1 10*3/uL (ref 0.0–0.5)
Eosinophils Relative: 1 %
HCT: 30.4 % — ABNORMAL LOW (ref 36.0–46.0)
Hemoglobin: 10.5 g/dL — ABNORMAL LOW (ref 12.0–15.0)
Immature Granulocytes: 6 %
Lymphocytes Relative: 9 %
Lymphs Abs: 1.1 10*3/uL (ref 0.7–4.0)
MCH: 30.3 pg (ref 26.0–34.0)
MCHC: 34.5 g/dL (ref 30.0–36.0)
MCV: 87.6 fL (ref 80.0–100.0)
Monocytes Absolute: 1.1 10*3/uL — ABNORMAL HIGH (ref 0.1–1.0)
Monocytes Relative: 10 %
Neutro Abs: 8.5 10*3/uL — ABNORMAL HIGH (ref 1.7–7.7)
Neutrophils Relative %: 74 %
Platelets: 72 10*3/uL — ABNORMAL LOW (ref 150–400)
RBC: 3.47 MIL/uL — ABNORMAL LOW (ref 3.87–5.11)
RDW: 16.2 % — ABNORMAL HIGH (ref 11.5–15.5)
Smear Review: DECREASED
WBC: 11.5 10*3/uL — ABNORMAL HIGH (ref 4.0–10.5)
nRBC: 0 % (ref 0.0–0.2)

## 2021-06-04 LAB — BASIC METABOLIC PANEL
Anion gap: 11 (ref 5–15)
BUN: 47 mg/dL — ABNORMAL HIGH (ref 8–23)
CO2: 22 mmol/L (ref 22–32)
Calcium: 9.5 mg/dL (ref 8.9–10.3)
Chloride: 107 mmol/L (ref 98–111)
Creatinine, Ser: 1.62 mg/dL — ABNORMAL HIGH (ref 0.44–1.00)
GFR, Estimated: 33 mL/min — ABNORMAL LOW (ref >60)
Glucose, Bld: 127 mg/dL — ABNORMAL HIGH (ref 70–99)
Potassium: 3.4 mmol/L — ABNORMAL LOW (ref 3.5–5.1)
Sodium: 140 mmol/L (ref 135–145)

## 2021-06-04 LAB — GLUCOSE, CAPILLARY
Glucose-Capillary: 102 mg/dL — ABNORMAL HIGH (ref 70–99)
Glucose-Capillary: 131 mg/dL — ABNORMAL HIGH (ref 70–99)
Glucose-Capillary: 139 mg/dL — ABNORMAL HIGH (ref 70–99)
Glucose-Capillary: 191 mg/dL — ABNORMAL HIGH (ref 70–99)
Glucose-Capillary: 215 mg/dL — ABNORMAL HIGH (ref 70–99)
Glucose-Capillary: 231 mg/dL — ABNORMAL HIGH (ref 70–99)

## 2021-06-04 MED ORDER — CEFAZOLIN SODIUM-DEXTROSE 2-4 GM/100ML-% IV SOLN
2.0000 g | Freq: Two times a day (BID) | INTRAVENOUS | Status: DC
  Administered 2021-06-04 – 2021-06-06 (×5): 2 g via INTRAVENOUS
  Filled 2021-06-04 (×5): qty 100

## 2021-06-04 MED ORDER — POTASSIUM CHLORIDE CRYS ER 20 MEQ PO TBCR
40.0000 meq | EXTENDED_RELEASE_TABLET | Freq: Once | ORAL | Status: AC
  Administered 2021-06-04: 40 meq via ORAL
  Filled 2021-06-04: qty 2

## 2021-06-04 MED ORDER — ADULT MULTIVITAMIN W/MINERALS CH
1.0000 | ORAL_TABLET | Freq: Every day | ORAL | Status: DC
  Administered 2021-06-04 – 2021-06-06 (×3): 1 via ORAL
  Filled 2021-06-04 (×3): qty 1

## 2021-06-04 MED ORDER — ENSURE ENLIVE PO LIQD
237.0000 mL | Freq: Two times a day (BID) | ORAL | Status: DC
  Administered 2021-06-05 – 2021-06-06 (×3): 237 mL via ORAL

## 2021-06-04 MED ORDER — GADOBUTROL 1 MMOL/ML IV SOLN
5.5000 mL | Freq: Once | INTRAVENOUS | Status: AC | PRN
  Administered 2021-06-04: 5.5 mL via INTRAVENOUS

## 2021-06-04 NOTE — Progress Notes (Signed)
PT Cancellation Note ? ?Patient Details ?Name: Courtney Goodman ?MRN: [id number] ?DOB: [birth year] ? ? ?Cancelled Treatment:    Reason Eval/Treat Not Completed: Patient at procedure or test/unavailable ? ?Attempted x2, however pt off the floor;  ?Will continue efforts;  ? ?Roney Marion, PT  ?Acute Rehabilitation Services ?Office [phone] ? ?[place] ?06/04/2021, 1:48 PM ?

## 2021-06-04 NOTE — Progress Notes (Signed)
?PROGRESS NOTE ? ? ? ?Courtney Goodman  IWP:[id number] DOB: 10-13-1945 DOA: 05/31/2021 ?PCP: [name], PA-C ? ? ?Brief Narrative:  ?76 year old woman who presented to [facility] ED via EMS for AMS x 1 day, hyperglycemia to 500s and concern for sepsis. PMHx significant for HTN, HLD, CAD, CVA (~15 years ago), asthma/COPD, T2DM, CKD stage III, anxiety/depression. ?  ?History obtained from daughter (at bedside). She reports that patient began acting confused yesterday and that blood glucoses were measuring higher than normal (300s as compared to 250s-260s). She was noted to be hypotensive today with SBP 70s and EMS was called. Daughter denies c/o fevers/chills, CP/SOB, n/v/d, tends toward constipation. Denies poor PO intake (drinks "8 bottles of water" daily). No significant changes in diet, no c/d abdominal pain or swelling. ?  ?On ED arrival, patient was afebrile with HR 60s-80s, SBP 70s-80s and normal RR, sats 98-100% on 2LNC. Labs were notable for WBC 35, H&H 9.4/29.0, Na 129, K 5.1, BUN/Cr 63/4.26 (baseliine 2.0-2.4), Glucose 357, ASTALT 69/26.  IV fluid resuscitation was initiated for possible sepsis and peripheral Levophed was initiated. UA/UCx and BCx were ordered. Broad-spectrum abx were initiated. CT Head NAICA. CT A/P demonstrated new 4.7 x 4.1cm mass-like hypodensity in the pancreatic head, c/f pancreatic pseudocyst vs. walled-off necrosis vs. contained duodenal perf, less likely neoplasm. ?  ?PCCM consulted for ICU admission in the setting of hypotension, pressor needs, possible developing sepsis. ? ?Patient transferred under [facility] on 06/02/2021. ? ?Significant Hospital Events: ?Including procedures, antibiotic start and stop dates in addition to other pertinent events   ?5/4 - Presented to [facility] ED via EMS for hypotension, hyperglycemia. CT Head negative. CT A/P demonstrated new 4.7 x 4.1cm mass-like hypodensity in the pancreatic head. Empiric cefepime/vanc started. Cx obtained. Peripheral NE started. Insulin  gtt. ?5/5 BCx +klebsiella pneumoniae. Abx narrowed to ceftriaxone. Improved glucoses on gtt, transition to SQ insulin. Remains on NE. Possible MRCP/further imaging. ?Assessment & Plan: ?  ?Active Problems: ?  Uncontrolled type 2 diabetes mellitus with hyperglycemia, with long-term current use of insulin ([place]) ?  Acute metabolic encephalopathy ?  Acute renal failure superimposed on stage 4 chronic kidney disease ([place]) ?  Protein-calorie malnutrition, severe ?  Septic shock due to Klebsiella pneumoniae [facility]) ?  Pancreatic mass ? ?Septic shock secondary to Klebsiella pneumonia bacteremia: Source of infection still remains unclear, it is sensitive to everything except penicillin.  We will touch base with ID for possible oral antibiotic option.  Continue Rocephin in the meantime. ?  ?New pancreatic head hypodensity/mass ?CT A/P 5/4 demonstrated new 4.7 x 4.1cm mass-like hypodensity in the pancreatic head, c/f pancreatic pseudocyst vs. walled-off necrosis vs. contained duodenal perf, less likely neoplasm in the setting of rapid development since 01/2021. ?- Amylase/lipase unremarkable ?-MRCP with contrast has been ordered. ? ?Uncontrolled type 2 diabetes mellitus with hyperglycemia: ?Home regimen includes: Insulin NPH 18U QAM, 5U QPM if BG > 300; Victoza 0.'6mg'$  daily: Currently on Semglee 10 units and SSI, blood sugar controlled. ?  ?Hypertension, essential: Blood pressure fairly controlled.  Continue Coreg. ? ?CAD: Stable.  Holding aspirin due to thrombocytopenia. ? ?HLD: Resume home medications. ? ?History of COPD/asthma ?-O2 weaned to off ?-Does not appear to be on scheduled bronchodilator regimen, albuterol available as needed ?  ?AKI on CKD stage IV: Her baseline creatinine appears to be around 2.2, she presented with 4.2, renal function is gradually improving and interestingly her creatinine is 1.62, better than her baseline. ? ?Hypokalemia: We will replace. ?  ?DIC:  Patient developed acute thrombocytopenia.  Has  chronic anemia which is stable.  DIC panel indicates that she possibly has acute DIC.  Thankfully, she is not bleeding at this point in time.  Her DVT prophylaxis was also switched from chemical to only SCD.  Platelets are stable.  Monitor closely.  Treating underlying cause which is likely septic shock.  Hopefully her numbers will improve in the next 2 to 3 days.  Platelets stable so far. ?  ?Dementia: ?History of stroke (remote, ~15 years ago).  Per daughter, she has been on Namenda since 6 months.  Patient is alert but confused.  Per daughter, she does have these behaviors intermittently so this is likely her baseline. ? ?Dysphagia: Seen by SLP, currently on dysphagia 2 diet. ? ?Physical deconditioning: PT OT recommended home health PT OT and per their discussion with the daughter, daughter wants her to be discharged home as well. ? ?DVT prophylaxis: SCDs Start: 05/31/21 1345 ?  Code Status: Full Code  ?Family Communication:  None present at bedside.  Plan of care discussed with patient's daughter over the phone yesterday.  We will call her again later today once MRCP results are back. ? ?Status is: Inpatient ?Remains inpatient appropriate because: Still sick. ? ? ?Estimated body mass index is 22.49 kg/m? as calculated from the following: ?  Height as of 02/04/21: '5\' 3"'$  (1.6 m). ?  Weight as of this encounter: 57.6 kg. ? ?Pressure Injury 06/02/21 Coccyx Right;Left Deep Tissue Pressure Injury - Purple or maroon localized area of discolored intact skin or blood-filled blister due to damage of underlying soft tissue from pressure and/or shear. (Active)  ?06/02/21 1950  ?Location: Coccyx  ?Location Orientation: Right;Left  ?Staging: Deep Tissue Pressure Injury - Purple or maroon localized area of discolored intact skin or blood-filled blister due to damage of underlying soft tissue from pressure and/or shear.  ?Wound Description (Comments):   ?Present on Admission:   ?Dressing Type Foam - Lift dressing to assess site  every shift 06/04/21 0730  ? ?Nutritional Assessment: ?Body mass index is 22.49 kg/m?Marland KitchenMarland Kitchen ?Seen by dietician.  I agree with the assessment and plan as outlined below: ?Nutrition Status: ?Nutrition Problem: Severe Malnutrition ?Etiology: chronic illness (dementia, COPD) ?Signs/Symptoms: moderate fat depletion, severe muscle depletion, percent weight loss (10.8% weight loss in less than 4 months) ?Percent weight loss: 10.8 % ?Interventions: Refer to RD note for recommendations ? ?. ?Skin Assessment: ?I have examined the patient's skin and I agree with the wound assessment as performed by the wound care RN as outlined below: ?Pressure Injury 06/02/21 Coccyx Right;Left Deep Tissue Pressure Injury - Purple or maroon localized area of discolored intact skin or blood-filled blister due to damage of underlying soft tissue from pressure and/or shear. (Active)  ?06/02/21 1950  ?Location: Coccyx  ?Location Orientation: Right;Left  ?Staging: Deep Tissue Pressure Injury - Purple or maroon localized area of discolored intact skin or blood-filled blister due to damage of underlying soft tissue from pressure and/or shear.  ?Wound Description (Comments):   ?Present on Admission:   ?Dressing Type Foam - Lift dressing to assess site every shift 06/04/21 0730  ? ? ?Consultants:  ?None ? ?Procedures:  ?None ? ?Antimicrobials:  ?Anti-infectives (From admission, onward)  ? ? Start     Dose/Rate Route Frequency Ordered Stop  ? 06/01/21 1000  ceFEPIme (MAXIPIME) 1 g in sodium chloride 0.9 % 100 mL IVPB  Status:  Discontinued       ? 1 g ?200 mL/hr over 30  Minutes Intravenous Every 24 hours 05/31/21 1152 06/01/21 0436  ? 06/01/21 1000  cefTRIAXone (ROCEPHIN) 2 g in sodium chloride 0.9 % 100 mL IVPB       ? 2 g ?200 mL/hr over 30 Minutes Intravenous Every 24 hours 06/01/21 0436    ? 05/31/21 1200  vancomycin (VANCOREADY) IVPB 1500 mg/300 mL       ? 1,500 mg ?150 mL/hr over 120 Minutes Intravenous  Once 05/31/21 1151 05/31/21 1916  ? 05/31/21  1151  vancomycin variable dose per unstable renal function (pharmacist dosing)  Status:  Discontinued       ?  Does not apply See admin instructions 05/31/21 1151 06/01/21 0436  ? 05/31/21 1145  ceFEPIme (MAXIPI

## 2021-06-04 NOTE — TOC Transition Note (Signed)
Transition of Care (TOC) - CM/SW Discharge Note ? ? ?Patient Details  ?Name: Courtney Goodman ?MRN: [id number] ?Date of Birth: Mar 24, 1945 ? ?Transition of Care (TOC) CM/SW Contact:  ?Tom-Johnson, Renea Ee, RN ?Phone Number: ?06/04/2021, 1:34 PM ? ? ?Clinical Narrative:    ? ?CM spoke with patient's daughter, [name] at bedside. Patient out of room for MRI. Patient is admitted for AMS and Hyperglycemia. Found to be in Septic shock 2/2 Klebsiella Pneumonia Bacteremia, on IV abx. Also newly found Pancreatic Mass. Scheduled MRCP for further Imaging.  ?Patient lives at home with [name], one of her two daughters. [name] states she is patient's primary care giver, takes patient to and from her appointments.  ?Patient has a walker, cane, wheelchair, shower seat and bsc.  ?PT/OT recommended  [place] states patient was active with [facility] services in 2022 and would like resumption of care at discharge. CM called and notified Delsa Sale with acceptance voiced. Info on AVS. ?PCP is [name] and uses [employer]. ?Daughter to transport at discharge. CM will continue to follow with needs.  ? ? ? ?Final next level of care: [place] ?Barriers to Discharge: Continued Medical Work up ? ? ?Patient Goals and CMS Choice ?Patient states their goals for this hospitalization and ongoing recovery are:: [place] home. ?CMS Medicare.gov Compare Post Acute Care list provided to:: Patient ?Choice offered to / list presented to : Patient, Adult Children (Daughter, [name]) ? ?Discharge Placement ?  ?           ?  ?  ?  ?  ? ?Discharge Plan and Services ?  ?Discharge Planning Services: CM Consult ?Post Acute Care Choice: Home Health          ?DME Arranged: N/A ?DME Agency: NA ?  ?  ?  ?HH Arranged: PT, RN, OT, Disease Management ?[place] Agency: [facility] ?Date HH Agency Contacted: 06/04/21 ?Time [place]: 1157 ?Representative spoke with at [place]: Delsa Sale ? ?Social Determinants of Health (SDOH)  Interventions ?  ? ? ?Readmission Risk Interventions ?   ? View : No data to display.  ?  ?  ?  ? ? ? ? ? ?

## 2021-06-04 NOTE — Progress Notes (Signed)
Nutrition Follow-up ? ?DOCUMENTATION CODES:  ? ?Severe malnutrition in context of chronic illness ? ?INTERVENTION:  ? ?Ensure Enlive po BID, each supplement provides 350 kcal and 20 grams of protein. ? ?MVI with Minerals ? ?Weight patient ? ?Feeding assistance at  meals times as needed ? ? ?NUTRITION DIAGNOSIS:  ? ?Severe Malnutrition related to chronic illness (dementia, COPD) as evidenced by moderate fat depletion, severe muscle depletion, percent weight loss (10.8% weight loss in less than 4 months). ? ?Being addressed as diet advanced, supplements ? ?GOAL:  ? ?Patient will meet greater than or equal to 90% of their needs ? ?Progressing ? ?MONITOR:  ? ?Diet advancement, Labs, Weight trends ? ?REASON FOR ASSESSMENT:  ? ?Consult ?Assessment of nutrition requirement/status ? ?ASSESSMENT:  ? ?76 year old female who presented to the ED on 5/04 with hypotension, AMS. PMH of dementia, T2DM, HLD, HTN, CKD stage III, CAD, CVA, COPD, anxiety, depression, anemia. Pt admitted with septic shock, new pancreatic head hypodensity/mass. ? ?05/04 - CT abdomen/pelvis showing new mass-like hypodensity in the pancreatic head and concern for pancreatic pseudocyst vs walled-off necrosis vs contained duodenal perf ?5/07  - diet advanced to Dysphagia 2, Thins ? ?Pt is alert but confused. Hx of dementia ?ERCP ordered ? ?SLP following; pt currently on dysphagia 2/Thins. Recorded po intake 25% of meals ? ?No new weight since admission ? ?Noted newly documented DTI to coccyx ? ?Labs: potassium 3.4 (L), Creatinine 1.62, CBGs 101-276 ?Meds: ss novolog, semglee, fenofibrate, KCl ? ?Diet Order:   ?Diet Order   ? ?       ?  DIET DYS 2 Room service appropriate? Yes; Fluid consistency: Thin  Diet effective now       ?  ? ?  ?  ? ?  ? ? ?EDUCATION NEEDS:  ? ?Not appropriate for education at this time ? ?Skin:  Skin Assessment: Skin Integrity Issues: ?Skin Integrity Issues:: DTI ?DTI: coccyx ? ?Last BM:  5/06 ? ?Height:  ? ?Ht Readings from Last 1  Encounters:  ?02/04/21 '5\' 3"'$  (1.6 m)  ? ? ?Weight:  ? ?Wt Readings from Last 1 Encounters:  ?06/01/21 57.6 kg  ? ? ?BMI:  Body mass index is 22.49 kg/m?. ? ?Estimated Nutritional Needs:  ? ?Kcal:  1650-1850 ? ?Protein:  75-90 grams ? ?Fluid:  1.6-1.8 L ? ? ?Kerman Passey MS, RDN, LDN, CNSC ?Registered Dietitian III ?Clinical Nutrition ?RD Pager and On-Call Pager Number Located in [place]  ? ?

## 2021-06-04 NOTE — Consult Note (Signed)
? ?  [facility] CM Inpatient Consult ? ? ?06/04/2021 ? ?Courtney Goodman ?[birth year] ?[id number] ? ?[facility] [ACO] Patient: [employer] ? ?Primary Care Provider:  [name], [place] ? ?Patient is currently active with [facility] for chronic disease management services.  Patient has been engaged by a [place].  Our community based plan of care has focused on disease management and community resource support.  Came by to speak with patient and she was with family, holding her hand, patient was asleep, did not awaken.   ? ?Plan: Will continue to follow for disposition and needs. ?Continue to follow. ? ?Of note, [facility] Care Management services does not replace or interfere with any services that are needed or arranged by inpatient [facility] care management team.  For additional questions or referrals please contact: ? ? ? ?

## 2021-06-04 NOTE — Progress Notes (Signed)
SLP Cancellation Note ? ?Patient Details ?Name: Courtney Goodman ?MRN: [id number] ?DOB: [birth year] ? ? ?Cancelled treatment:        Pt is NPO for an abdominal MRI per RN. Will plan to check on tomorrow.  ? ? ?[place] Siren ?06/04/2021, 9:22 AM ?

## 2021-06-04 NOTE — Care Management Important Message (Signed)
Important Message ? ?Patient Details  ?Name: Courtney Goodman ?MRN: [id number] ?Date of Birth: 04/03/1945 ? ? ?Medicare Important Message Given:  Yes ? ? ? ? ?Kyran Connaughton ?06/04/2021, 3:45 PM ?

## 2021-06-05 ENCOUNTER — Encounter (HOSPITAL_COMMUNITY): Payer: Self-pay | Admitting: Internal Medicine

## 2021-06-05 DIAGNOSIS — N179 Acute kidney failure, unspecified: Secondary | ICD-10-CM | POA: Diagnosis not present

## 2021-06-05 DIAGNOSIS — R6521 Severe sepsis with septic shock: Secondary | ICD-10-CM | POA: Diagnosis not present

## 2021-06-05 DIAGNOSIS — E1165 Type 2 diabetes mellitus with hyperglycemia: Secondary | ICD-10-CM | POA: Diagnosis not present

## 2021-06-05 DIAGNOSIS — A414 Sepsis due to anaerobes: Secondary | ICD-10-CM | POA: Diagnosis not present

## 2021-06-05 DIAGNOSIS — K8689 Other specified diseases of pancreas: Secondary | ICD-10-CM | POA: Diagnosis not present

## 2021-06-05 DIAGNOSIS — Z794 Long term (current) use of insulin: Secondary | ICD-10-CM | POA: Diagnosis not present

## 2021-06-05 LAB — BASIC METABOLIC PANEL
Anion gap: 6 (ref 5–15)
BUN: 43 mg/dL — ABNORMAL HIGH (ref 8–23)
CO2: 25 mmol/L (ref 22–32)
Calcium: 9.5 mg/dL (ref 8.9–10.3)
Chloride: 110 mmol/L (ref 98–111)
Creatinine, Ser: 1.42 mg/dL — ABNORMAL HIGH (ref 0.44–1.00)
GFR, Estimated: 38 mL/min — ABNORMAL LOW (ref >60)
Glucose, Bld: 141 mg/dL — ABNORMAL HIGH (ref 70–99)
Potassium: 4.2 mmol/L (ref 3.5–5.1)
Sodium: 141 mmol/L (ref 135–145)

## 2021-06-05 LAB — CULTURE, BLOOD (ROUTINE X 2)

## 2021-06-05 LAB — CBC WITH DIFFERENTIAL/PLATELET
Abs Immature Granulocytes: 0.65 10*3/uL — ABNORMAL HIGH (ref 0.00–0.07)
Basophils Absolute: 0 10*3/uL (ref 0.0–0.1)
Basophils Relative: 0 %
Eosinophils Absolute: 0.1 10*3/uL (ref 0.0–0.5)
Eosinophils Relative: 1 %
HCT: 27 % — ABNORMAL LOW (ref 36.0–46.0)
Hemoglobin: 9.4 g/dL — ABNORMAL LOW (ref 12.0–15.0)
Immature Granulocytes: 5 %
Lymphocytes Relative: 9 %
Lymphs Abs: 1.1 10*3/uL (ref 0.7–4.0)
MCH: 30.2 pg (ref 26.0–34.0)
MCHC: 34.8 g/dL (ref 30.0–36.0)
MCV: 86.8 fL (ref 80.0–100.0)
Monocytes Absolute: 1.1 10*3/uL — ABNORMAL HIGH (ref 0.1–1.0)
Monocytes Relative: 8 %
Neutro Abs: 10.3 10*3/uL — ABNORMAL HIGH (ref 1.7–7.7)
Neutrophils Relative %: 77 %
Platelets: 83 10*3/uL — ABNORMAL LOW (ref 150–400)
RBC: 3.11 MIL/uL — ABNORMAL LOW (ref 3.87–5.11)
RDW: 16.3 % — ABNORMAL HIGH (ref 11.5–15.5)
WBC: 13.4 10*3/uL — ABNORMAL HIGH (ref 4.0–10.5)
nRBC: 0 % (ref 0.0–0.2)

## 2021-06-05 LAB — GLUCOSE, CAPILLARY
Glucose-Capillary: 131 mg/dL — ABNORMAL HIGH (ref 70–99)
Glucose-Capillary: 120 mg/dL — ABNORMAL HIGH (ref 70–99)
Glucose-Capillary: 167 mg/dL — ABNORMAL HIGH (ref 70–99)
Glucose-Capillary: 262 mg/dL — ABNORMAL HIGH (ref 70–99)
Glucose-Capillary: 241 mg/dL — ABNORMAL HIGH (ref 70–99)

## 2021-06-05 MED ORDER — TRAMADOL HCL 50 MG PO TABS: 50.0000 mg | ORAL_TABLET | Freq: Two times a day (BID) | ORAL | Status: DC | PRN

## 2021-06-05 NOTE — Progress Notes (Signed)
SLP Cancellation Note ? ?Patient Details ?Name: Courtney Goodman ?MRN: [id number] ?DOB: 08-02-1945 ? ? ?Cancelled treatment:       Reason Eval/Treat Not Completed: Patient declined, no reason specified. Patient asleep in bed when SLP entered room. She awoke to voice but was then tearful and said she was "tired and sleepy". She declined any PO's at this time. SLP will continue to follow for ability to trial upgraded solid texture PO's. ? ? ?Sonia Baller, MA, CCC-SLP ?Speech Therapy ? ?

## 2021-06-05 NOTE — Progress Notes (Signed)
Occupational Therapy Treatment ?Patient Details ?Name: Courtney Goodman ?MRN: [id number] ?DOB: 01-05-1946 ?Today's Date: 06/05/2021 ? ? ?History of present illness Pt is a 76 y.o. F who presents with hypotension, hyperglycemia. CT head negative. CT A/P demonstrated new 4.7 x 4.1cm mass-like hypodensity in the pancreatic head.  PCCM consulted for ICU admission in the setting of hypotension, pressor needs, possible developing sepsis. Significant PMH: HTN, HLD, CAD, CVA (~15 years ago), asthma/COPD, T2DM, CKD stage III, anxiety/depression. ?  ?OT comments ? Pt making steady progress towards OT goals this session. Pt continues to present with baseline cognitive deficits, impaired balance, decreased activity tolerance and generalized deconditioning . Pt currently requires MOD A for stand pivot transfer with RW and MAX A for self feeding tasks likely d/t visual/motor planning deficits. Pt would continue to benefit from skilled occupational therapy while admitted and after d/c to address the below listed limitations in order to improve overall functional mobility and facilitate independence with BADL participation. DC plan remains appropriate, will follow acutely per POC.  ? ?  ? ?Recommendations for follow up therapy are one component of a multi-disciplinary discharge planning process, led by the attending physician.  Recommendations may be updated based on patient status, additional functional criteria and insurance authorization. ?   ?Follow Up Recommendations ? Home health OT (Daughter prefers pt d/c home with [facility] services.)  ?  ?Assistance Recommended at Discharge Frequent or constant Supervision/Assistance  ?Patient can return home with the following ? A lot of help with walking and/or transfers;A lot of help with bathing/dressing/bathroom;Assistance with feeding;Assistance with cooking/housework;Direct supervision/assist for medications management;Assist for transportation;Help with stairs or ramp for entrance ?   ?Equipment Recommendations ? None recommended by OT  ?  ?Recommendations for Other Services   ? ?  ?Precautions / Restrictions Precautions ?Precautions: Fall ?Precaution Comments: hand mit on L side ?Restrictions ?Weight Bearing Restrictions: No  ? ? ?  ? ?Mobility Bed Mobility ?Overal bed mobility: Needs Assistance ?Bed Mobility: Supine to Sit ?  ?  ?Supine to sit: Mod assist, HOB elevated ?  ?  ?General bed mobility comments: MOD A to elevate trunk, able to progresss bLEs to EOB ?  ? ?Transfers ?Overall transfer level: Needs assistance ?Equipment used: Rolling walker (2 wheels) ?Transfers: Sit to/from Stand, Bed to chair/wheelchair/BSC ?Sit to Stand: Mod assist ?Stand pivot transfers: Mod assist ?  ?  ?  ?  ?General transfer comment: heavy MOD A to stand from EOB with cues for hand placment, MODA to pivot with RW needing step by step cues to sequence pivotal steps to recliner ?  ?  ?Balance Overall balance assessment: Needs assistance ?Sitting-balance support: Feet supported, Bilateral upper extremity supported ?Sitting balance-Leahy Scale: Fair ?  ?  ?Standing balance support: Bilateral upper extremity supported ?Standing balance-Leahy Scale: Poor ?Standing balance comment: required BUE support and external assist ?  ?  ?  ?  ?  ?  ?  ?  ?  ?  ?  ?   ? ?ADL either performed or assessed with clinical judgement  ? ?ADL Overall ADL's : Needs assistance/impaired ?Eating/Feeding: Maximal assistance;Sitting;Cueing for safety;Cueing for sequencing ?Eating/Feeding Details (indicate cue type and reason): hand over hand max A d/t depth perception issues, likely baseline ?  ?  ?  ?  ?  ?  ?  ?  ?Lower Body Dressing: Maximal assistance;Sitting/lateral leans ?Lower Body Dressing Details (indicate cue type and reason): to fix sock from EOB ?Toilet Transfer: Moderate assistance;Rolling walker (2 wheels);Stand-pivot;Cueing for safety;Cueing  for sequencing ?Toilet Transfer Details (indicate cue type and reason): simulated via  functionalmobility in room; MAX cues for sequencing and safety ?  ?  ?  ?  ?Functional mobility during ADLs: Moderate assistance;Cueing for safety;Cueing for sequencing;Rolling walker (2 wheels) ?General ADL Comments: pt continues to present with cognitive deficits, impaired balance, decreased activity tolerance and generalized deconditioning ?  ? ?Extremity/Trunk Assessment Upper Extremity Assessment ?Upper Extremity Assessment: Generalized weakness ?  ?Lower Extremity Assessment ?Lower Extremity Assessment: Defer to PT evaluation ?  ?  ?  ? ?Vision Baseline Vision/History: 1 Wears glasses (seems to have baseline visual deficits as pt reports "I cant see" during self feeding tasks but difficult to assess secondary to cog) ?Ability to See in Adequate Light: 0 Adequate ?Patient Visual Report: No change from baseline ?  ?  ?Perception Perception ?Perception: Impaired ?Figure Ground: noted when reaching for items on tray table with BUEs ?Spatial Orientation: depth peception issues noted during self feeding tasks ?Topographical Orientation: depth peception issues noted during self feeding tasks ?  ?Praxis Praxis ?Praxis: Impaired ?Praxis Impairment Details: Motor planning ?  ? ?Cognition Arousal/Alertness: Awake/alert ?Behavior During Therapy: Anxious, Agitated ?Overall Cognitive Status: History of cognitive impairments - at baseline ?  ?  ?  ?  ?  ?  ?  ?  ?  ?  ?  ?  ?  ?  ?  ?  ?General Comments: pt intially emotional upon arrival adamantly requesting to get OOB, pt wanting to sit on couch in room and resistant to sitting in recliner, needing MAX multimodal cues for command following related to functional mobility( hand placement on RW, stride length, body awareness) pt did respond well to playing gospel music for relaxation ?  ?  ?   ?Exercises   ? ?  ?Shoulder Instructions   ? ? ?  ?General Comments BP from recliner 116/76 ( 89) HR 68 bpm, assisted pt with apple sauce  ? ? ?Pertinent Vitals/ Pain       Pain  Assessment ?Pain Assessment: No/denies pain ? ?Home Living   ?  ?  ?  ?  ?  ?  ?  ?  ?  ?  ?  ?  ?  ?  ?  ?  ?  ?  ? ?  ?Prior Functioning/Environment    ?  ?  ?  ?   ? ?Frequency ? Min 2X/week  ? ? ? ? ?  ?Progress Toward Goals ? ?OT Goals(current goals can now be found in the care plan section) ? Progress towards OT goals: Progressing toward goals ? ?Acute Rehab OT Goals ?Patient Stated Goal: to go home ?OT Goal Formulation: With patient ?Time For Goal Achievement: 06/17/21 ?Potential to Achieve Goals: Fair ?ADL Goals ?Pt Will Perform Grooming: with set-up;sitting ?Pt Will Perform Upper Body Dressing: with set-up;sitting ?Pt Will Perform Lower Body Dressing: with min assist;sit to/from stand ?Pt Will Transfer to Toilet: with min assist;bedside commode;stand pivot transfer ?Additional ADL Goal #1: Pt will follow one step commands for 75% of the session to assist in ADLs  ?Plan Discharge plan remains appropriate;Frequency remains appropriate   ? ?Co-evaluation ? ? ?   ?  ?  ?  ?  ? ?  ?AM-PAC OT "6 Clicks" Daily Activity     ?Outcome Measure ? ? Help from another person eating meals?: A Lot ?Help from another person taking care of personal grooming?: A Lot ?Help from another person toileting, which includes using toliet, bedpan,  or urinal?: A Lot ?Help from another person bathing (including washing, rinsing, drying)?: A Lot ?Help from another person to put on and taking off regular upper body clothing?: A Lot ?Help from another person to put on and taking off regular lower body clothing?: A Lot ?6 Click Score: 12 ? ?  ?End of Session Equipment Utilized During Treatment: Gait belt;Rolling walker (2 wheels) ? ?OT Visit Diagnosis: Unsteadiness on feet (R26.81);Other abnormalities of gait and mobility (R26.89);Muscle weakness (generalized) (M62.81);History of falling (Z91.81) ?  ?Activity Tolerance Patient tolerated treatment well ?  ?Patient Left in chair;with call bell/phone within reach;with chair alarm set ?  ?Nurse  Communication Mobility status;Other (comment) (stedy back to bed; need L hand mit back on when done with eating) ?  ? ?   ? ?Time: 0826-0900 ?OT Time Calculation (min): 34 min ? ?Charges: OT General Charges ?$OT Visit

## 2021-06-05 NOTE — Consult Note (Addendum)
? ?                                                                          [facility] Consult: ?10:03 AM ?06/05/2021 ? LOS: 5 days  ? ? ?Referring Provider: Dr Doristine Bosworth  ?Primary Care Physician:  [name], PA-C ?Primary Gastroenterologist:  Dr. Keith Rake. In [place].     ? ? ? ?Reason for Consultation: Cystic lesions at head of pancreas. ?  ?HPI: Courtney Goodman is a 77 y.o. female.  Hx dementia.  DM 2, insulin requiring.  CKD stage III.  Hypertension.  Ambulates with walker and wheelchair. ? ?02/2006 colonoscopy.  By Dr. Gaylyn Cheers.  evaluate FOBT positive.  Found nonbleeding, small internal hemorrhoids.  Ileocecal valve described as "very plump" and was biopsied.  Prep described as fair but lavage required for visualization.  Otherwise normal study.  Unable to locate pathology report. ?Did not locate any upper endoscopy report. ?Home meds include but not limited to omeprazole 40 mg daily, Victoza ? ?02/03/2021 CTAP wo contrast.  For evaluation of nausea, vomiting, abdominal pain.  This was unremarkable for any acute abdominal/pelvic pathology.  Did show chronic thoracic, lumbar compression fractures and advanced aortic atherosclerosis.  LFTS, lipase normal then.   ? ?Patient presented to the ED 6 days ago after found unresponsive, hypotensive, bradycardic at home.  Initially in the ICU for management of possible sepsis w shock but now on hospitalist service.  No mention of abdominal pain, nausea or vomiting which she denies currently. ? ?05/31/2021 CTAP wo contrast: New, 4.7 x 4.1 mass at the head of the pancreas surrounded by mild inflammation.  This compared with the normal appearance of this area on January CT.  Rule out complex pseudocyst, walled off necrosis, duodenal perforation, neoplasm considered less likely given rapid onset of changes. ?06/04/2021 MRI/MRCP.  Significant motion  degradation obscuring fine detail and small lesions.  2 cystic lesions measuring 4.8 and the other 1.9 cm, at the head of the pancreas.  May be small pseudocysts or cystic neoplasms. ? ?Lipase normal.  Amylase below normal.  Other than AST elevation at 69, low albumin of 2.4, her LFTs are normal. ?Glucose 357 at arrival. ?Lactic acid 4.1 ?WBCs 35.6.. 13.4.  Hgb 9.4.  MCV 86.  Platelets 83 K.  INR 1.5. ?Anemia profile with normal iron, TIBC, iron sats, folate, B12.  Ferritin 442. ?Growing Klebsiella on blood cultures. ?  ?Lives with her daughter and son-in-law for the last 5 months.  No ETOH.   ?The only family history listed is diabetes in her mother. ? ? ?Past Medical History:  ?Diagnosis Date  ? Anemia of chronic disease   ? Asthma   ? Atherosclerotic heart disease of native coronary artery without angina pectoris   ? CKD (chronic kidney disease)   ? COPD (chronic obstructive pulmonary disease) ([place])   ? DM (diabetes mellitus), type 2 with renal complications ([place])   ? Dysrhythmia   ? Elevated ferritin 01/03/2017  ? GAD (generalized anxiety disorder)   ? Hypertension   ? Mixed hyperlipidemia due to type 2 diabetes mellitus ([place])   ? Mixed incontinence   ? per medical records from [place]  ? Osteopenia   ?  Personal history of noncompliance with medical treatment, presenting hazards to health   ? Shortness of breath dyspnea   ? Suicidal ideation   ? TB (tuberculosis), treated   ? age 34  ? Vitamin D deficiency   ? ? ?Past Surgical History:  ?Procedure Laterality Date  ? ANKLE RECONSTRUCTION    ? right  ? CARPAL TUNNEL RELEASE    ? COLONOSCOPY  02/2006  ? Dr April Manson in [place].  for FOBT +.  non-bleeding int hemorrhoids.  bx of "plump" appearing IC valve.  ? CORONARY ANGIOPLASTY    ? ? ?Prior to Admission medications   ?Medication Sig Start Date End Date Taking? Authorizing Provider  ?albuterol (PROVENTIL HFA;VENTOLIN HFA) 108 (90 Base) MCG/ACT inhaler Inhale 2 puffs into the lungs every 6 (six) hours as needed  for wheezing or shortness of breath. 04/14/17  Yes Henson, Vickie L, NP-C  ?aspirin EC 81 MG tablet Take 81 mg by mouth daily.    Yes [provider]  ?Biotin 10000 MCG TABS Take 10,000 mcg by mouth daily.   Yes [provider]  ?carvedilol (COREG) 6.25 MG tablet Take 6.25 mg by mouth in the morning and at bedtime.   Yes [provider]  ?fenofibrate (TRICOR) 145 MG tablet Take 1 tablet (145 mg total) by mouth daily. 04/12/21  Yes Francis Gaines B, PA-C  ?folic acid (FOLVITE) 1 MG tablet TAKE ONE TABLET BY MOUTH EVERY MORNING ?Patient taking differently: Take 1 mg by mouth in the morning. 05/10/21  Yes [name], PA-C  ?Insulin NPH, Human,, Isophane, (NOVOLIN N FLEXPEN) 100 UNIT/ML Kiwkpen Inject 15 Units into the skin every morning. And pen needles 1/day ?Patient taking differently: Inject 5-18 Units into the skin See admin instructions. Inject 18 units into the skin in the morning and 5 units in the evening if BGL is 300 or greater 02/05/21  Yes Ghimire, Henreitta Leber, MD  ?liraglutide (VICTOZA) 18 MG/3ML SOPN Inject 0.6 mg into the skin every morning. 10/27/20  Yes Renato Shin, MD  ?memantine (NAMENDA) 10 MG tablet Take 1 tablet (10 mg at night) for 2 weeks, then increase to 1 tablet (10 mg) twice a day ?Patient taking differently: Take 10 mg by mouth in the morning and at bedtime. 02/01/21  Yes [name], PA-C  ?omeprazole (PRILOSEC) 40 MG capsule TAKE ONE CAPSULE BY MOUTH EVERY MORNING ?Patient taking differently: 40 mg daily before breakfast. 04/12/21  Yes Francis Gaines B, PA-C  ?simvastatin (ZOCOR) 20 MG tablet Take 1 tablet (20 mg total) by mouth at bedtime. 04/20/21  Yes [name], PA-C  ?Alcohol Swabs (ALCOHOL PREP) PADS Use for testing ?Patient taking differently: 1 each by Other route 2 (two) times daily. E11.9 08/07/18   Henson, Vickie L, NP-C  ?carvedilol (COREG) 12.5 MG tablet TAKE ONE TABLET BY MOUTH EVERY MORNING and TAKE ONE TABLET BY MOUTH EVERYDAY AT  BEDTIME ?Patient not taking: Reported on 05/31/2021 02/05/21   Jonetta Osgood, MD  ?Cholecalciferol 25 MCG (1000 UT) tablet Take 1 tablet by mouth daily. ?Patient not taking: Reported on 05/31/2021 08/11/19   [name] L, NP-C  ?fluticasone (FLONASE) 50 MCG/ACT nasal spray Place 2 sprays into both nostrils daily. ?Patient not taking: Reported on 05/31/2021 07/25/16   [name] L, NP-C  ?glucose blood (ONETOUCH VERIO) test strip 1 each by Other route 2 (two) times daily. And lancets 2/day 10/27/20   Renato Shin, MD  ?Insulin Pen Needle (PEN NEEDLES) 32G X 5 MM  MISC 1 each by Does not apply route daily. 10/27/20   Renato Shin, MD  ?Lancets [facility] DELICA PLUS [id number]) MISC USE TO test four times A DAY 10/27/20   Renato Shin, MD  ?rivastigmine (EXELON) 4.6 mg/24hr Place 1 patch (4.6 mg total) onto the skin daily. ?Patient not taking: Reported on 05/31/2021 11/13/20   Cameron Sprang, MD  ? ? ?Scheduled Meds: ? carvedilol  6.25 mg Oral BID WC  ? Chlorhexidine Gluconate Cloth  6 each Topical Daily  ? feeding supplement  237 mL Oral BID BM  ? fenofibrate  54 mg Oral Daily  ? insulin aspart  0-9 Units Subcutaneous Q4H  ? insulin glargine-yfgn  10 Units Subcutaneous Q24H  ? mouth rinse  15 mL Mouth Rinse BID  ? multivitamin with minerals  1 tablet Oral Daily  ? pantoprazole  40 mg Oral Daily  ? simvastatin  20 mg Oral QHS  ? ?Infusions: ?  ceFAZolin (ANCEF) IV 2 g (06/05/21 0912)  ? ?PRN Meds: ?docusate sodium, polyethylene glycol ? ? ?Allergies as of 05/31/2021  ? (No Known Allergies)  ? ? ?Family History  ?Problem Relation Age of Onset  ? Diabetes Mother   ? ? ?Social History  ? ?Socioeconomic History  ? Marital status: Divorced  ?  Spouse name: Not on file  ? Number of children: 2  ? Years of education: 29  ? Highest education level: High school graduate  ?Occupational History  ? Occupation: Retired  ?Tobacco Use  ? Smoking status: Former  ? Smokeless tobacco: Never  ?Vaping Use  ? Vaping Use: Never used   ?Substance and Sexual Activity  ? Alcohol use: No  ? Drug use: No  ? Sexual activity: Not Currently  ?Other Topics Concern  ? Not on file  ?Social History Narrative  ? Lives alone   ? Left handed  ? ?Social Determinants of

## 2021-06-05 NOTE — Progress Notes (Signed)
Physical Therapy Treatment ?Patient Details ?Name: Courtney Goodman ?MRN: [id number] ?DOB: 05-08-1945 ?Today's Date: 06/05/2021 ? ? ?History of Present Illness Pt is a 76 y.o. F who presents with hypotension, hyperglycemia. CT head negative. CT A/P demonstrated new 4.7 x 4.1cm mass-like hypodensity in the pancreatic head.  PCCM consulted for ICU admission in the setting of hypotension, pressor needs, possible developing sepsis. Significant PMH: HTN, HLD, CAD, CVA (~15 years ago), asthma/COPD, T2DM, CKD stage III, anxiety/depression. ? ?  ?PT Comments  ? ? Continuing work on functional mobility and activity tolerance;  Session focused on gait and watching activity tolerance; pt needs heavy mod assist to stand; once up to RW, steps for in-room amb overall smooth, but slowed with time in upright standing/walking, and needed to sit after about 20 feet; Possibly with BP drop in standing, but unable to get a reading in standing; will consider orthostatics next session;  ? ?In considering options for discharge, I value going back to familiar environment, caregivers, and routines for patients with dementia.   ?  ?Recommendations for follow up therapy are one component of a multi-disciplinary discharge planning process, led by the attending physician.  Recommendations may be updated based on patient status, additional functional criteria and insurance authorization. ? ?Follow Up Recommendations ? Home health PT ?  ?  ?Assistance Recommended at Discharge Frequent or constant Supervision/Assistance  ?Patient can return home with the following A lot of help with walking and/or transfers;A lot of help with bathing/dressing/bathroom;Assistance with cooking/housework;Assistance with feeding;Direct supervision/assist for medications management;Assist for transportation;Help with stairs or ramp for entrance ?  ?Equipment Recommendations ? None recommended by PT (pt well equipped)  ?  ?Recommendations for Other Services   ? ? ?   ?Precautions / Restrictions Precautions ?Precautions: Fall ?Precaution Comments: hand mit on L side ?Restrictions ?Weight Bearing Restrictions: No  ?  ? ?Mobility ? Bed Mobility ?  ?  ?  ?  ?  ?  ?  ?  ?  ? ?Transfers ?Overall transfer level: Needs assistance ?Equipment used: Rolling walker (2 wheels) ?Transfers: Sit to/from Stand ?Sit to Stand: Mod assist ?  ?  ?  ?  ?  ?General transfer comment: heavy MOD A to stand from EOB with cues for hand placment; Mod assist as well to control descent to sit ?  ? ?Ambulation/Gait ?Ambulation/Gait assistance: Min guard, Min assist ?Gait Distance (Feet): 20 Feet ?Assistive device: Rolling walker (2 wheels) ?Gait Pattern/deviations: Step-through pattern, Decreased step length - right, Decreased step length - left, Decreased stride length ?  ?  ?  ?General Gait Details: Short steps, but automatic and smooth gait pattern otherwise; at the door way, noted pt's eyes fluttering closed; offered recliner to sit, and she agreed; cues for hand placement, mod assist to control descent ? ? ?Stairs ?  ?  ?  ?  ?  ? ? ?Wheelchair Mobility ?  ? ?Modified Rankin (Stroke Patients Only) ?  ? ? ?  ?Balance   ?  ?Sitting balance-Leahy Scale: Fair ?  ?  ?  ?Standing balance-Leahy Scale: Poor ?Standing balance comment: required BUE support and external assist ?  ?  ?  ?  ?  ?  ?  ?  ?  ?  ?  ?  ? ?  ?Cognition Arousal/Alertness: Awake/alert ?Behavior During Therapy: Anxious, Agitated ?Overall Cognitive Status: History of cognitive impairments - at baseline ?  ?  ?  ?  ?  ?  ?  ?  ?  ?  ?  ?  ?  ?  ?  ?  ?  General Comments: Crying upon PT arrival ?  ?  ? ?  ?Exercises   ? ?  ?General Comments General comments (skin integrity, edema, etc.): BP immediately sitting after walking 113/66; HR 63 ?  ?  ? ?Pertinent Vitals/Pain Pain Assessment ?Pain Assessment: No/denies pain ?Pain Intervention(s): Monitored during session  ? ? ?Home Living   ?  ?  ?  ?  ?  ?  ?  ?  ?  ?   ?  ?Prior Function    ?  ?  ?    ? ?PT Goals (current goals can now be found in the care plan section) Acute Rehab PT Goals ?Patient Stated Goal: pt daughter wishes to take pt home ?PT Goal Formulation: With patient/family ?Time For Goal Achievement: 06/17/21 ?Potential to Achieve Goals: Fair ?Progress towards PT goals: Progressing toward goals ? ?  ?Frequency ? ? ? Min 3X/week ? ? ? ?  ?PT Plan Current plan remains appropriate  ? ? ?Co-evaluation   ?  ?  ?  ?  ? ?  ?AM-PAC PT "6 Clicks" Mobility   ?Outcome Measure ? Help needed turning from your back to your side while in a flat bed without using bedrails?: A Little ?Help needed moving from lying on your back to sitting on the side of a flat bed without using bedrails?: A Lot ?Help needed moving to and from a bed to a chair (including a wheelchair)?: A Lot ?Help needed standing up from a chair using your arms (e.g., wheelchair or bedside chair)?: A Lot ?Help needed to walk in hospital room?: A Little ?Help needed climbing 3-5 steps with a railing? : A Lot ?6 Click Score: 14 ? ?  ?End of Session Equipment Utilized During Treatment: Gait belt (and chair follow) ?Activity Tolerance: Other (comment) (limited 2/2 cognition) ?Patient left: in chair;with call bell/phone within reach;with chair alarm set ?Nurse Communication: Mobility status ?PT Visit Diagnosis: Unsteadiness on feet (R26.81);Muscle weakness (generalized) (M62.81);Difficulty in walking, not elsewhere classified (R26.2) ?  ? ? ?Time: 7939-0300 ?PT Time Calculation (min) (ACUTE ONLY): 21 min ? ?Charges:  $Gait Training: 8-22 mins          ?          ? ?Roney Marion, PT  ?Acute Rehabilitation Services ?Office [phone] ? ? ? ?[place] ?06/05/2021, 10:54 AM ? ?

## 2021-06-05 NOTE — Progress Notes (Signed)
?PROGRESS NOTE ? ? ? ?Courtney Goodman  ZOX:[id number] DOB: [birth year] DOA: 05/31/2021 ?PCP: [name], PA-C ? ? ?Brief Narrative:  ?76 year old woman who presented to [facility] ED via EMS for AMS x 1 day, hyperglycemia to 500s and concern for sepsis. PMHx significant for HTN, HLD, CAD, CVA (~15 years ago), asthma/COPD, T2DM, CKD stage III, anxiety/depression. ?  ?History obtained from daughter (at bedside). She reports that patient began acting confused yesterday and that blood glucoses were measuring higher than normal (300s as compared to 250s-260s). She was noted to be hypotensive today with SBP 70s and EMS was called. Daughter denies c/o fevers/chills, CP/SOB, n/v/d, tends toward constipation. Denies poor PO intake (drinks "8 bottles of water" daily). No significant changes in diet, no c/d abdominal pain or swelling. ?  ?On ED arrival, patient was afebrile with HR 60s-80s, SBP 70s-80s and normal RR, sats 98-100% on 2LNC. Labs were notable for WBC 35, H&H 9.4/29.0, Na 129, K 5.1, BUN/Cr 63/4.26 (baseliine 2.0-2.4), Glucose 357, ASTALT 69/26.  IV fluid resuscitation was initiated for possible sepsis and peripheral Levophed was initiated. UA/UCx and BCx were ordered. Broad-spectrum abx were initiated. CT Head NAICA. CT A/P demonstrated new 4.7 x 4.1cm mass-like hypodensity in the pancreatic head, c/f pancreatic pseudocyst vs. walled-off necrosis vs. contained duodenal perf, less likely neoplasm. ?  ?PCCM consulted for ICU admission in the setting of hypotension, pressor needs, possible developing sepsis. ? ?Patient transferred under [place] on 06/02/2021. ? ?Significant Hospital Events: ?Including procedures, antibiotic start and stop dates in addition to other pertinent events   ?5/4 - Presented to [facility] ED via EMS for hypotension, hyperglycemia. CT Head negative. CT A/P demonstrated new 4.7 x 4.1cm mass-like hypodensity in the pancreatic head. Empiric cefepime/vanc started. Cx obtained. Peripheral NE started. Insulin  gtt. ?5/5 BCx +klebsiella pneumoniae. Abx narrowed to ceftriaxone. Improved glucoses on gtt, transition to SQ insulin. Remains on NE. Possible MRCP/further imaging. ?Assessment & Plan: ?  ?Active Problems: ?  Uncontrolled type 2 diabetes mellitus with hyperglycemia, with long-term current use of insulin ([place]) ?  Acute metabolic encephalopathy ?  Acute renal failure superimposed on stage 4 chronic kidney disease ([place]) ?  Protein-calorie malnutrition, severe ?  Septic shock due to Klebsiella pneumoniae [facility]) ?  Pancreatic mass ? ?Septic shock secondary to Klebsiella pneumonia bacteremia: Source of infection still remains unclear, it is sensitive to everything except penicillin.  ID recommended cefadroxil at discharge to complete total of 10 to 14 days of antibiotics, but we transitioned her to cefazolin on 06/04/2021 while she remains hospitalized. ?  ?New pancreatic head hypodensity/mass ?CT A/P 5/4 demonstrated new 4.7 x 4.1cm mass-like hypodensity in the pancreatic head, c/f pancreatic pseudocyst vs. walled-off necrosis vs. contained duodenal perf, less likely neoplasm in the setting of rapid development since 01/2021. ?- Amylase/lipase unremarkable ?-MRCP also confirms the possibility of mass and EUS is likely needed.  I have consulted GI for their recommendations. ? ?Uncontrolled type 2 diabetes mellitus with hyperglycemia: ?Home regimen includes: Insulin NPH 18U QAM, 5U QPM if BG > 300; Victoza 0.'6mg'$  daily: Currently on Semglee 10 units and SSI, blood sugar controlled. ?  ?Hypertension, essential: Blood pressure fairly controlled.  Continue Coreg. ? ?CAD: Stable.  Holding aspirin due to thrombocytopenia. ? ?HLD: Resume home medications. ? ?History of COPD/asthma ?-O2 weaned to off ?-Does not appear to be on scheduled bronchodilator regimen, albuterol available as needed ?  ?AKI on CKD stage IV: Her baseline creatinine appears to be around 2.2, she presented with  4.2, renal function is gradually improving and  interestingly her creatinine is 1.42, better than her baseline. ? ?Hypokalemia: Resolved. ?  ?DIC: Patient developed acute thrombocytopenia.  Has chronic anemia which is stable.  DIC panel indicates that she possibly has acute DIC.  Thankfully, she is not bleeding at this point in time.  Her DVT prophylaxis was also switched from chemical to only SCD.  Platelets are stable.  Monitor closely.  Treating underlying cause which is likely septic shock.  Hopefully her numbers will improve in the next 2 to 3 days.  Platelets stable and now have started to rise. ?  ?Dementia: ?History of stroke (remote, ~15 years ago).  Per daughter, she has been on Namenda since 6 months.  Patient is alert but confused.  Per daughter, she does have these behaviors intermittently so this is likely her baseline. ? ?Dysphagia: Seen by SLP, currently on dysphagia 2 diet. ? ?Physical deconditioning: PT OT recommended home health PT OT and per their discussion with the daughter, daughter wants her to be discharged home as well. ? ?DVT prophylaxis: SCDs Start: 05/31/21 1345 ?  Code Status: Full Code  ?Family Communication:  None present at bedside.  Plan of care discussed with patient's daughter over the phone yesterday.  ? ?Status is: Inpatient ?Remains inpatient appropriate because: Still sick. ? ? ?Estimated body mass index is 23.39 kg/m? as calculated from the following: ?  Height as of 02/04/21: '5\' 3"'$  (1.6 m). ?  Weight as of this encounter: 59.9 kg. ? ?Pressure Injury 06/02/21 Coccyx Right;Left Deep Tissue Pressure Injury - Purple or maroon localized area of discolored intact skin or blood-filled blister due to damage of underlying soft tissue from pressure and/or shear. (Active)  ?06/02/21 1950  ?Location: Coccyx  ?Location Orientation: Right;Left  ?Staging: Deep Tissue Pressure Injury - Purple or maroon localized area of discolored intact skin or blood-filled blister due to damage of underlying soft tissue from pressure and/or shear.   ?Wound Description (Comments):   ?Present on Admission: No  ?Dressing Type Foam - Lift dressing to assess site every shift 06/04/21 0730  ? ?Nutritional Assessment: ?Body mass index is 23.39 kg/m?Marland KitchenMarland Kitchen ?Seen by dietician.  I agree with the assessment and plan as outlined below: ?Nutrition Status: ?Nutrition Problem: Severe Malnutrition ?Etiology: chronic illness (dementia, COPD) ?Signs/Symptoms: moderate fat depletion, severe muscle depletion, percent weight loss (10.8% weight loss in less than 4 months) ?Percent weight loss: 10.8 % ?Interventions: Refer to RD note for recommendations ? ?. ?Skin Assessment: ?I have examined the patient's skin and I agree with the wound assessment as performed by the wound care RN as outlined below: ?Pressure Injury 06/02/21 Coccyx Right;Left Deep Tissue Pressure Injury - Purple or maroon localized area of discolored intact skin or blood-filled blister due to damage of underlying soft tissue from pressure and/or shear. (Active)  ?06/02/21 1950  ?Location: Coccyx  ?Location Orientation: Right;Left  ?Staging: Deep Tissue Pressure Injury - Purple or maroon localized area of discolored intact skin or blood-filled blister due to damage of underlying soft tissue from pressure and/or shear.  ?Wound Description (Comments):   ?Present on Admission: No  ?Dressing Type Foam - Lift dressing to assess site every shift 06/04/21 0730  ? ? ?Consultants:  ?GI ? ?Procedures:  ?None ? ?Antimicrobials:  ?Anti-infectives (From admission, onward)  ? ? Start     Dose/Rate Route Frequency Ordered Stop  ? 06/04/21 1315  ceFAZolin (ANCEF) IVPB 2g/100 mL premix       ? 2 g ?  200 mL/hr over 30 Minutes Intravenous Every 12 hours 06/04/21 1228    ? 06/01/21 1000  ceFEPIme (MAXIPIME) 1 g in sodium chloride 0.9 % 100 mL IVPB  Status:  Discontinued       ? 1 g ?200 mL/hr over 30 Minutes Intravenous Every 24 hours 05/31/21 1152 06/01/21 0436  ? 06/01/21 1000  cefTRIAXone (ROCEPHIN) 2 g in sodium chloride 0.9 % 100 mL  IVPB  Status:  Discontinued       ? 2 g ?200 mL/hr over 30 Minutes Intravenous Every 24 hours 06/01/21 0436 06/04/21 1228  ? 05/31/21 1200  vancomycin (VANCOREADY) IVPB 1500 mg/300 mL       ? 1,500 mg ?150 mL/hr over 1

## 2021-06-06 ENCOUNTER — Inpatient Hospital Stay: Payer: Medicare HMO

## 2021-06-06 ENCOUNTER — Other Ambulatory Visit (HOSPITAL_COMMUNITY): Payer: Self-pay

## 2021-06-06 ENCOUNTER — Inpatient Hospital Stay: Payer: Medicare HMO | Admitting: Internal Medicine

## 2021-06-06 ENCOUNTER — Encounter: Payer: Self-pay | Admitting: Internal Medicine

## 2021-06-06 ENCOUNTER — Other Ambulatory Visit: Payer: Medicare HMO

## 2021-06-06 ENCOUNTER — Ambulatory Visit: Payer: Medicare HMO

## 2021-06-06 DIAGNOSIS — E43 Unspecified severe protein-calorie malnutrition: Secondary | ICD-10-CM

## 2021-06-06 DIAGNOSIS — N179 Acute kidney failure, unspecified: Secondary | ICD-10-CM | POA: Diagnosis not present

## 2021-06-06 DIAGNOSIS — A414 Sepsis due to anaerobes: Secondary | ICD-10-CM | POA: Diagnosis not present

## 2021-06-06 DIAGNOSIS — G9341 Metabolic encephalopathy: Secondary | ICD-10-CM | POA: Diagnosis not present

## 2021-06-06 DIAGNOSIS — E1165 Type 2 diabetes mellitus with hyperglycemia: Secondary | ICD-10-CM | POA: Diagnosis not present

## 2021-06-06 LAB — CBC WITH DIFFERENTIAL/PLATELET
Abs Immature Granulocytes: 0.64 10*3/uL — ABNORMAL HIGH (ref 0.00–0.07)
Basophils Absolute: 0 10*3/uL (ref 0.0–0.1)
Basophils Relative: 0 %
Eosinophils Absolute: 0.1 10*3/uL (ref 0.0–0.5)
Eosinophils Relative: 0 %
HCT: 26.2 % — ABNORMAL LOW (ref 36.0–46.0)
Hemoglobin: 9.1 g/dL — ABNORMAL LOW (ref 12.0–15.0)
Immature Granulocytes: 3 %
Lymphocytes Relative: 6 %
Lymphs Abs: 1.1 10*3/uL (ref 0.7–4.0)
MCH: 30.4 pg (ref 26.0–34.0)
MCHC: 34.7 g/dL (ref 30.0–36.0)
MCV: 87.6 fL (ref 80.0–100.0)
Monocytes Absolute: 1.2 10*3/uL — ABNORMAL HIGH (ref 0.1–1.0)
Monocytes Relative: 6 %
Neutro Abs: 15.9 10*3/uL — ABNORMAL HIGH (ref 1.7–7.7)
Neutrophils Relative %: 85 %
Platelets: 104 10*3/uL — ABNORMAL LOW (ref 150–400)
RBC: 2.99 MIL/uL — ABNORMAL LOW (ref 3.87–5.11)
RDW: 16.5 % — ABNORMAL HIGH (ref 11.5–15.5)
WBC: 18.9 10*3/uL — ABNORMAL HIGH (ref 4.0–10.5)
nRBC: 0 % (ref 0.0–0.2)

## 2021-06-06 LAB — BASIC METABOLIC PANEL
Anion gap: 6 (ref 5–15)
BUN: 41 mg/dL — ABNORMAL HIGH (ref 8–23)
CO2: 26 mmol/L (ref 22–32)
Calcium: 9.4 mg/dL (ref 8.9–10.3)
Chloride: 106 mmol/L (ref 98–111)
Creatinine, Ser: 1.4 mg/dL — ABNORMAL HIGH (ref 0.44–1.00)
GFR, Estimated: 39 mL/min — ABNORMAL LOW (ref >60)
Glucose, Bld: 154 mg/dL — ABNORMAL HIGH (ref 70–99)
Potassium: 4 mmol/L (ref 3.5–5.1)
Sodium: 138 mmol/L (ref 135–145)

## 2021-06-06 LAB — GLUCOSE, CAPILLARY
Glucose-Capillary: 108 mg/dL — ABNORMAL HIGH (ref 70–99)
Glucose-Capillary: 138 mg/dL — ABNORMAL HIGH (ref 70–99)
Glucose-Capillary: 181 mg/dL — ABNORMAL HIGH (ref 70–99)

## 2021-06-06 MED ORDER — CARVEDILOL 6.25 MG PO TABS
6.2500 mg | ORAL_TABLET | Freq: Two times a day (BID) | ORAL | 0 refills | Status: AC
  Filled 2021-06-06: qty 60, 30d supply, fill #0

## 2021-06-06 MED ORDER — CEFADROXIL 500 MG PO CAPS
1.0000 g | ORAL_CAPSULE | Freq: Two times a day (BID) | ORAL | 0 refills | Status: AC
  Filled 2021-06-06: qty 20, 5d supply, fill #0

## 2021-06-06 MED ORDER — ASPIRIN 81 MG PO TBEC
81.0000 mg | DELAYED_RELEASE_TABLET | Freq: Every day | ORAL | 0 refills | Status: AC
Start: 2021-06-13 — End: ?
  Filled 2021-06-06: qty 30, 30d supply, fill #0

## 2021-06-06 NOTE — TOC Transition Note (Signed)
Transition of Care (TOC) - CM/SW Discharge Note ? ? ?Patient Details  ?Name: Courtney Goodman ?MRN: [id number] ?Date of Birth: Dec 07, 1945 ? ?Transition of Care (TOC) CM/SW Contact:  ?Tom-Johnson, Renea Ee, RN ?Phone Number: ?06/06/2021, 11:15 AM ? ? ?Clinical Narrative:    ? ?Patient is scheduled for discharge today. HHPT/OT/RN/Disease Management referral with [facility]. Info on AVS. Denies any other needs. Daughter to transport at discharge. No further TOC needs noted.  ? ?Final next level of care: [place] ?Barriers to Discharge: Barriers Resolved ? ? ?Patient Goals and CMS Choice ?Patient states their goals for this hospitalization and ongoing recovery are:: Toreturn home. ?CMS Medicare.gov Compare Post Acute Care list provided to:: Patient ?Choice offered to / list presented to : Patient, Adult Children (Daughter, [name]) ? ?Discharge Placement ?  ?           ?  ?Patient to be transferred to facility by: Daughter ?  ?  ? ?Discharge Plan and Services ?  ?Discharge Planning Services: CM Consult ?Post Acute Care Choice: Home Health          ?DME Arranged: N/A ?DME Agency: NA ?  ?  ?  ?HH Arranged: PT, RN, OT, Disease Management ?[place] Agency: [facility] ?Date HH Agency Contacted: 06/04/21 ?Time [place]: [id number] ?Representative spoke with at [place]: Delsa Sale ? ?Social Determinants of Health (SDOH) Interventions ?  ? ? ?Readmission Risk Interventions ?   ? View : No data to display.  ?  ?  ?  ? ? ? ? ? ?

## 2021-06-06 NOTE — Progress Notes (Signed)
Patient is being discharged home with daughter. Daughter, [name] is at bedside. Discharge instructions reviewed including medications. Daughter verbalized a full understanding of all instructions. Medications given to pt for discharge from pharmacy prior to leaving.  ?

## 2021-06-06 NOTE — Discharge Summary (Signed)
GI was consulted howeverPatientPhysician Discharge Summary  ?Courtney Goodman ZOX:[id number] DOB: 05/28/1945 DOA: 05/31/2021 ? ?PCP: [name], PA-C ? ?Admit date: 05/31/2021 ?Discharge date: 06/06/2021 ?30 Day Unplanned Readmission Risk Score   ? ?Flowsheet Row ED to Hosp-Admission (Current) from 05/31/2021 in [facility]  ?30 Day Unplanned Readmission Risk Score (%) 23.49 Filed at 06/06/2021 0801  ? ?  ? ? This score is the patient's risk of an unplanned readmission within 30 days of being discharged (0 -100%). The score is based on dignosis, age, lab data, medications, orders, and past utilization.   ?Low:  0-14.9   Medium: 15-21.9   High: 22-29.9   Extreme: 30 and above ? ?  ? ?  ? ? ? ?Admitted From: Home ?Disposition: Home ? ?Recommendations for Outpatient Follow-up:  ?Follow up with PCP in 1-2 weeks ?Please obtain BMP/CBC in one week ?Follow-up with GI in 4 to 6 weeks, they will arrange outpatient follow-up for repeat CT abdomen. ?Please follow up with your PCP on the following pending results: ?Unresulted Labs (From admission, onward)  ? ?  Start     Ordered  ? 06/04/21 0500  CBC with Differential/Platelet  Daily,   R     ?Question:  Specimen collection method  Answer:  Lab=Lab collect  ? 06/03/21 1112  ? ?  ?  ? ?  ?  ? ? ?Home Health: Yes ?Equipment/Devices: None ? ?Discharge Condition: Stable ?CODE STATUS: Full code ?Diet recommendation: Cardiac ? ?Subjective: Seen and examined.  Alert and oriented to self.  She is wanting to go home.  She has no complaints. ? ?Brief/Interim Summary: 76 year old woman who presented to [facility] ED via EMS for AMS x 1 day, hyperglycemia to 500s and concern for sepsis. PMHx significant for HTN, HLD, CAD, CVA (~15 years ago), asthma/COPD, T2DM, CKD stage III, anxiety/depression. ?  ?History obtained from daughter (at bedside). She reports that patient began acting confused a day prior to admission and that blood glucoses were measuring higher than normal (300s as  compared to 250s-260s). She was noted to be hypotensive with SBP 70s and EMS was called. Daughter denied c/o fevers/chills, CP/SOB, n/v/d, tends toward constipation. Denies poor PO intake (drinks "8 bottles of water" daily).  ?  ?On ED arrival, patient was afebrile with HR 60s-80s, SBP 70s-80s and normal RR, sats 98-100% on 2LNC. Labs were notable for WBC 35, H&H 9.4/29.0, Na 129, K 5.1, BUN/Cr 63/4.26 (baseliine 2.0-2.4), Glucose 357, ASTALT 69/26.  IV fluid resuscitation was initiated for possible sepsis and peripheral Levophed was initiated. UA/UCx and BCx were ordered. Broad-spectrum abx were initiated. CT Head NAICA. CT A/P demonstrated new 4.7 x 4.1cm mass-like hypodensity in the pancreatic head, c/f pancreatic pseudocyst vs. walled-off necrosis vs. contained duodenal perf, less likely neoplasm. ?  ?PCCM consulted for ICU admission in the setting of hypotension, pressor needs, possible developing sepsis. ?  ?5/5 BCx +klebsiella pneumoniae. Abx narrowed to ceftriaxone. Improved glucoses on gtt, transition to SQ insulin.  Subsequently weaned off of vasopressors.  She was transferred under [facility] on medical floor on 06/02/2021.  Details as below. ? ?Septic shock secondary to Klebsiella pneumonia bacteremia: Source of infection still remains unclear, it is sensitive to everything except penicillin.  She was transitioned to IV cefazolin once sensitivities were back, discussed with ID who recommended discharging on cefadroxil as outpatient to complete total 10 to 14 days of therapy.  She has received 7 days of IV antibiotics, she is being discharged  on 5 more days of oral antibiotics.  She has remained afebrile for last several days.  Leukocytosis improved as well. ?  ?New pancreatic head hypodensity/mass ?CT A/P 5/4 demonstrated new 4.7 x 4.1cm mass-like hypodensity in the pancreatic head, c/f pancreatic pseudocyst vs. walled-off necrosis vs. contained duodenal perf, less likely neoplasm in the setting of rapid  development since 01/2021. ?- Amylase/lipase unremarkable ?-MRCP also confirms the possibility of mass and EUS is likely needed.  GI was consulted, however after the review, they think that the pancreatic mass is likely a cyst or an infection secondary to pancreatitis and not concern for neoplasm, they recommended continuing antibiotics as outpatient and waiting for few weeks and they will follow-up with patient as outpatient and repeat CT abdomen.  Patient's daughter is fully aware of the plan.  GI personally called yesterday.  I also confirmed with her today.  GI recommended discontinuing Victoza. ?  ?Uncontrolled type 2 diabetes mellitus with hyperglycemia: ?Home regimen includes: Insulin NPH 18U QAM, 5U QPM if BG > 300; Victoza 0.'6mg'$  daily: Currently on Semglee 10 units and SSI, blood sugar controlled.  Per GI recommendation, we are discontinuing Victoza.  Daughter has been advised to seek consultation from endocrinology as outpatient. ?  ?Hypertension, essential: Blood pressure fairly controlled.  Continue Coreg. ?  ?CAD: Stable.  Holding aspirin due to thrombocytopenia. ?  ?HLD: Resume home medications. ?  ?History of COPD/asthma ?-O2 weaned to off ?-Does not appear to be on scheduled bronchodilator regimen, albuterol available as needed ?  ?AKI on CKD stage IV: Her baseline creatinine appears to be around 2.2, she presented with 4.2, renal function is gradually improving and interestingly her creatinine is 1.42, better than her baseline. ?  ?Hypokalemia: Resolved. ?  ?DIC: Patient developed acute thrombocytopenia.  Has chronic anemia which is stable.  DIC panel indicates that she possibly has acute DIC.  Thankfully, she is not bleeding at this point in time.  Her DVT prophylaxis was also switched from chemical to only SCD.  Platelets are now improving.  Hemoglobin has remained stable.  I have recommended that she hold her aspirin for another week and then resumes back.  I highly recommend that she checks her  CBC and BMP at her next PCP visit within 1 week.  ?  ?Dementia: ?History of stroke (remote, ~15 years ago).  Per daughter, she has been on Namenda since 6 months.  Patient is alert but confused.  Per daughter, she does have these behaviors intermittently so this is her baseline. ?  ?Dysphagia: Seen by SLP, currently on dysphagia 2 diet. ?  ?Physical deconditioning: PT OT recommended SNF however daughter wanted patient to come home so home health PT OT is ordered. ? ?Discharge plan was discussed with patient and/or family member and they verbalized understanding and agreed with it.  ?Discharge Diagnoses:  ?Active Problems: ?  Uncontrolled type 2 diabetes mellitus with hyperglycemia, with long-term current use of insulin ([place]) ?  Acute metabolic encephalopathy ?  Acute renal failure superimposed on stage 4 chronic kidney disease ([place]) ?  Protein-calorie malnutrition, severe ?  Septic shock due to Klebsiella pneumoniae [facility]) ?  Pancreatic mass ? ? ? ?Discharge Instructions ? ? ?Allergies as of 06/06/2021   ?No Known Allergies ?  ? ?  ?Medication List  ?  ? ?STOP taking these medications   ? ?fluticasone 50 MCG/ACT nasal spray ?Commonly known as: FLONASE ?  ?rivastigmine 4.6 mg/24hr ?Commonly known as: EXELON ?  ?Victoza 18 MG/3ML Sopn ?  Generic drug: liraglutide ?  ? ?  ? ?TAKE these medications   ? ?albuterol 108 (90 Base) MCG/ACT inhaler ?Commonly known as: VENTOLIN HFA ?Inhale 2 puffs into the lungs every 6 (six) hours as needed for wheezing or shortness of breath. ?  ?Alcohol Prep Pads ?Use for testing ?What changed:  ?how much to take ?how to take this ?when to take this ?additional instructions ?  ?aspirin 81 MG EC tablet ?Take 1 tablet (81 mg total) by mouth daily. ?Start taking on: Jun 13, 2021 ?What changed: These instructions start on Jun 13, 2021. If you are unsure what to do until then, ask your doctor or other care provider. ?  ?Biotin 10000 MCG Tabs ?Take 10,000 mcg by mouth daily. ?  ?carvedilol 6.25 MG  tablet ?Commonly known as: COREG ?Take 1 tablet (6.25 mg total) by mouth 2 (two) times daily with a meal. ?What changed:  ?when to take this ?Another medication with the same name was removed. Continue taking thi

## 2021-06-07 ENCOUNTER — Telehealth: Payer: Self-pay

## 2021-06-07 ENCOUNTER — Ambulatory Visit (INDEPENDENT_AMBULATORY_CARE_PROVIDER_SITE_OTHER): Payer: Medicare HMO | Admitting: Internal Medicine

## 2021-06-07 ENCOUNTER — Encounter: Payer: Self-pay | Admitting: Internal Medicine

## 2021-06-07 VITALS — BP 120/68 | HR 69 | Ht 63.0 in | Wt 135.4 lb

## 2021-06-07 DIAGNOSIS — Z794 Long term (current) use of insulin: Secondary | ICD-10-CM

## 2021-06-07 DIAGNOSIS — E785 Hyperlipidemia, unspecified: Secondary | ICD-10-CM | POA: Diagnosis not present

## 2021-06-07 DIAGNOSIS — N183 Chronic kidney disease, stage 3 unspecified: Secondary | ICD-10-CM

## 2021-06-07 DIAGNOSIS — E1122 Type 2 diabetes mellitus with diabetic chronic kidney disease: Secondary | ICD-10-CM | POA: Diagnosis not present

## 2021-06-07 MED ORDER — INSULIN PEN NEEDLE 32G X 4 MM MISC: 3 refills | Status: AC

## 2021-06-07 MED ORDER — TRESIBA FLEXTOUCH 100 UNIT/ML ~~LOC~~ SOPN: 20.0000 [IU] | PEN_INJECTOR | Freq: Every day | SUBCUTANEOUS | 5 refills | Status: AC

## 2021-06-07 MED ORDER — FREESTYLE LIBRE 2 READER DEVI: 1.0000 | Freq: Every day | 0 refills | Status: DC

## 2021-06-07 MED ORDER — FREESTYLE LIBRE 2 SENSOR MISC: 1.0000 | 3 refills | Status: AC

## 2021-06-07 MED ORDER — FIASP FLEXTOUCH 100 UNIT/ML ~~LOC~~ SOPN: 5.0000 [IU] | PEN_INJECTOR | Freq: Three times a day (TID) | SUBCUTANEOUS | 5 refills | Status: AC

## 2021-06-07 MED ORDER — GLUCAGON 3 MG/DOSE NA POWD: 3.0000 mg | Freq: Once | NASAL | 11 refills | Status: AC | PRN

## 2021-06-07 NOTE — Telephone Encounter (Signed)
Transition Care Management Follow-up Telephone Call ?Date of discharge and from where: Courtney Goodman 06/06/21 ?How have you been since you were released from the hospital? [place] ?Any questions or concerns? No ? ?Items Reviewed: ?Did the pt receive and understand the discharge instructions provided? Yes  ?Medications obtained and verified? Yes  ?Other? No  ?Any new allergies since your discharge? No  ?Dietary orders reviewed? Yes ?Do you have support at home? Yes  ? ?Home Care and Equipment/Supplies: ?Were home health services ordered? yes ?If so, what is the name of the agency? [facility]  ?Has the agency set up a time to come to the patient's home? yes ?Were any new equipment or medical supplies ordered?  No ? ?Functional Questionnaire: (I = Independent and D = Dependent) ?ADLs: I ? ?Bathing/Dressing- I ? ?Meal Prep- I ? ?Eating- I ? ?Maintaining continence- I ? ?Transferring/Ambulation- I ? ?Managing Meds- I ? ?Follow up appointments reviewed: ? ?PCP Hospital f/u appt confirmed? Yes  Scheduled to see Courtney Goodman on 06/11/21 @ 11:45. ?[facility] f/u appt confirmed? No   ?Are transportation arrangements needed? No  ?If their condition worsens, is the pt aware to call PCP or go to the Emergency Dept.? Yes ?Was the patient provided with contact information for the PCP's office or ED? Yes ?Was to pt encouraged to call back with questions or concerns? Yes  ?

## 2021-06-07 NOTE — Progress Notes (Signed)
Patient ID: Courtney Goodman, female   DOB: [birth year], 76 y.o.   MRN: [id number] ? ?This visit occurred during the SARS-CoV-2 public health emergency.  Safety protocols were in place, including screening questions prior to the visit, additional usage of staff PPE, and extensive cleaning of exam room while observing appropriate contact time as indicated for disinfecting solutions.  ? ?HPI: ?Courtney Goodman is a 76 y.o.-year-old female, returning for follow-up for DM2, dx in 2005, insulin-dependent since 2009, uncontrolled, with complications (CKD stage 3, h/o hypoglycemia).  She previously saw Dr. Loanne Drilling, last visit 08/2020. ?She is here with her daughter who offers all of the history, as patient is nonverbal. ? ?She was just discharged after being admitted for AMS, pancreatitis, sepsis with Klebsiella.  During this admission she was found to have a pancreatic mass, but it was felt that this was not malignant, but not consistent with pancreatic pseudocyst versus walled -off necrosis versus contained duodenal perforation.  Further investigation is pending. ? ?Reviewed HbA1c: ?Lab Results  ?Component Value Date  ? HGBA1C 10.8 (H) 06/01/2021  ? HGBA1C 9.3 (H) 02/03/2021  ? HGBA1C 10.6 (A) 09/12/2020  ? HGBA1C 8.9 (A) 06/20/2020  ? HGBA1C 8.2 (A) 03/02/2020  ? HGBA1C 7.9 (A) 11/26/2019  ? HGBA1C 8.2 (A) 09/15/2019  ? HGBA1C 7.5 (A) 06/21/2019  ? HGBA1C 7.6 (A) 03/22/2019  ? HGBA1C 7.1 (A) 01/12/2019  ? ?Pt is on a regimen of: ?- Novolin N 18 units before b'fast  ?- Victoza 0.6 mg in am >> stopped at the recent admission for pancreatitis ?- Semglee 10 units at bedtime was suggested in the hospital but she did not start ? ?Patient's daughter lives with her and he feeds her and gives her the insulin injections. ? ?Pt checks her sugars 1x a day and they are: ?- am: 265-512 ?- 2h after b'fast: n/c ?- before lunch: n/c ?- 2h after lunch: n/c ?- before dinner: n/c ?- 2h after dinner: n/c ?- bedtime: n/c ?- nighttime:  n/c ?Lowest sugar was 127 >> 217 ?Highest sugar was 500s. ? ?Glucometer: One Touch Verio ? ?Pt's meals are: ?- Breakfast: 1 egg + toast + fruit + coffee ?- Lunch: hot dog, salad + fruit ?- Dinner: meat + veggies +/- starch ?- Snacks: apple sauce; pork skins, cheetos, chips, graham crackers, ginger snaps ? ?- + CKD stage 3, last BUN/creatinine:  ?Lab Results  ?Component Value Date  ? BUN 41 (H) 06/06/2021  ? BUN 43 (H) 06/05/2021  ? CREATININE 1.40 (H) 06/06/2021  ? CREATININE 1.42 (H) 06/05/2021  ?She is not on ACE inhibitor/ARB ? ?-+ HL; last set of lipids: ?Lab Results  ?Component Value Date  ? CHOL 123 05/07/2017  ? HDL 34 (L) 05/07/2017  ? [place] 73 05/07/2017  ? TRIG 81 05/07/2017  ? CHOLHDL 3.6 05/07/2017  ?Also: Zocor 20 mg daily, fenofibrate 145 mg daily. ? ?- last eye exam was March 26, 2021. No DR.  ? ?- no numbness and tingling in her feet.  Latest foot exam was on 04/27/2021 by Dr. Elisha Ponder. ? ?She is on ASA 81. ? ?She has a history of HTN, COPD, AOCD, GAD. ? ?ROS: ?I could not obtain history from the patient, but she appears to have abdominal pain at today's visit.  No increased urination.  He has good appetite, except for recently, during the episode of pancreatitis. ? ?Past Medical History:  ?Diagnosis Date  ? Anemia of chronic disease   ? Asthma   ? Atherosclerotic heart  disease of native coronary artery without angina pectoris   ? CKD (chronic kidney disease)   ? COPD (chronic obstructive pulmonary disease) ([place])   ? DM (diabetes mellitus), type 2 with renal complications ([place])   ? Dysrhythmia   ? Elevated ferritin 01/03/2017  ? GAD (generalized anxiety disorder)   ? Hypertension   ? Mixed hyperlipidemia due to type 2 diabetes mellitus ([place])   ? Mixed incontinence   ? per medical records from [place]  ? Osteopenia   ? Personal history of noncompliance with medical treatment, presenting hazards to health   ? Shortness of breath dyspnea   ? Suicidal ideation   ? TB (tuberculosis), treated   ? age 19  ?  Vitamin D deficiency   ? ?Past Surgical History:  ?Procedure Laterality Date  ? ANKLE RECONSTRUCTION    ? right  ? CARPAL TUNNEL RELEASE    ? COLONOSCOPY  02/2006  ? Dr April Manson in [place].  for FOBT +.  non-bleeding int hemorrhoids.  bx of "plump" appearing IC valve.  ? CORONARY ANGIOPLASTY    ? ?Social History  ? ?Socioeconomic History  ? Marital status: Divorced  ?  Spouse name: Not on file  ? Number of children: 2  ? Years of education: 55  ? Highest education level: High school graduate  ?Occupational History  ? Occupation: Retired  ?Tobacco Use  ? Smoking status: Former  ? Smokeless tobacco: Never  ?Vaping Use  ? Vaping Use: Never used  ?Substance and Sexual Activity  ? Alcohol use: No  ? Drug use: No  ? Sexual activity: Not Currently  ?Other Topics Concern  ? Not on file  ?Social History Narrative  ? Lives alone   ? Left handed  ? ?Social Determinants of Health  ? ?Financial Resource Strain: Low Risk   ? Difficulty of Paying Living Expenses: Not hard at all  ?Food Insecurity: No Food Insecurity  ? Worried About Charity fundraiser in the Last Year: Never true  ? Ran Out of Food in the Last Year: Never true  ?Transportation Needs: No Transportation Needs  ? Lack of Transportation (Medical): No  ? Lack of Transportation (Non-Medical): No  ?Physical Activity: Inactive  ? Days of Exercise per Week: 0 days  ? Minutes of Exercise per Session: 0 min  ?Stress: No Stress Concern Present  ? Feeling of Stress : Only a little  ?Social Connections: Not on file  ?Intimate Partner Violence: Not on file  ? ?Current Outpatient Medications on File Prior to Visit  ?Medication Sig Dispense Refill  ? albuterol (PROVENTIL HFA;VENTOLIN HFA) 108 (90 Base) MCG/ACT inhaler Inhale 2 puffs into the lungs every 6 (six) hours as needed for wheezing or shortness of breath. 1 Inhaler 0  ? Alcohol Swabs (ALCOHOL PREP) PADS Use for testing (Patient taking differently: 1 each by Other route 2 (two) times daily. E11.9) 100 each 1  ?  [START ON 06/13/2021] aspirin 81 MG EC tablet Take 1 tablet (81 mg total) by mouth daily. 30 tablet 0  ? Biotin 10000 MCG TABS Take 10,000 mcg by mouth daily.    ? carvedilol (COREG) 6.25 MG tablet Take 1 tablet (6.25 mg total) by mouth 2 (two) times daily with a meal. 60 tablet 0  ? cefadroxil (DURICEF) 500 MG capsule Take 2 capsules (1,000 mg total) by mouth 2 (two) times daily for 5 days. 20 capsule 0  ? Cholecalciferol 25 MCG (1000 UT) tablet Take 1 tablet by mouth daily. (  Patient not taking: Reported on 05/31/2021) 90 tablet 0  ? fenofibrate (TRICOR) 145 MG tablet Take 1 tablet (145 mg total) by mouth daily. 30 tablet 2  ? folic acid (FOLVITE) 1 MG tablet TAKE ONE TABLET BY MOUTH EVERY MORNING (Patient taking differently: Take 1 mg by mouth in the morning.) 30 tablet 2  ? glucose blood (ONETOUCH VERIO) test strip 1 each by Other route 2 (two) times daily. And lancets 2/day 100 each 12  ? Insulin NPH, Human,, Isophane, (NOVOLIN N FLEXPEN) 100 UNIT/ML Kiwkpen Inject 15 Units into the skin every morning. And pen needles 1/day (Patient taking differently: Inject 5-18 Units into the skin See admin instructions. Inject 18 units into the skin in the morning and 5 units in the evening if BGL is 300 or greater) 30 mL 11  ? Insulin Pen Needle (PEN NEEDLES) 32G X 5 MM MISC 1 each by Does not apply route daily. 100 each 3  ? Lancets (ONETOUCH DELICA PLUS [id number]) MISC USE TO test four times A DAY 400 each 0  ? memantine (NAMENDA) 10 MG tablet Take 1 tablet (10 mg at night) for 2 weeks, then increase to 1 tablet (10 mg) twice a day (Patient taking differently: Take 10 mg by mouth in the morning and at bedtime.) 60 tablet 11  ? omeprazole (PRILOSEC) 40 MG capsule TAKE ONE CAPSULE BY MOUTH EVERY MORNING (Patient taking differently: 40 mg daily before breakfast.) 30 capsule 2  ? simvastatin (ZOCOR) 20 MG tablet Take 1 tablet (20 mg total) by mouth at bedtime. 90 tablet 1  ? ?No current facility-administered medications on file  prior to visit.  ? ?No Known Allergies ?Family History  ?Problem Relation Age of Onset  ? Diabetes Mother   ? ? ?PE: ?BP 120/68 (BP Location: Left Arm, Patient Position: Sitting, Cuff Size: Normal)   Pulse 69   Ht 5

## 2021-06-07 NOTE — Patient Instructions (Addendum)
Please stop NPH and start: ?- Tresiba 20 units at bedtime (increase by 2-4 units every 2 days until sugars improve close to goal) ?- Fiasp 5-7 units 3x a day, before meals (increase by 2 units until sugars improve close to goal) ? ?Goals: ?- fasting: 80-130 ?- 2h after meals <180 ? ?Please return in 3 months with your sugar log.  ? ?

## 2021-06-08 ENCOUNTER — Other Ambulatory Visit: Payer: Self-pay

## 2021-06-08 NOTE — Patient Outreach (Signed)
[facility]) Care Management ? ?06/08/2021 ? ?Courtney Goodman ?09-Dec-1945 ?[id number] ? ? ?Telephone call to daughter [name] for hospital follow up.  Daughter answered and then call dropped.  Tried several times to reach again.  Phone only rings once and goes straight to voice mail.   ? ?Plan: RN CM will attempt again within 4 business days.   ? ?Jone Baseman, RN, MSN ?[facility] Care Management ?Care Management Coordinator ?Direct Line [phone] ?Toll Free: [phone]  ?Fax: [phone] ? ?

## 2021-06-08 NOTE — Patient Outreach (Signed)
?Courtney Goodman) Care Management ?Telephonic RN Care Manager Note ? ? ?06/08/2021 ?Name:  Courtney Goodman MRN:  [id number] DOB:  [birth year] ? ?Summary: ?Incoming call from daughter Courtney Goodman. She reports that patient is doing ok but is weak from her hospitalization.  Home Health has made an initial visit yesterday.  She saw the endocrinologisit yesterday and medication changed were made.  Blood sugars are 200-250 range right now but with medication changes and sepsis full recovery sugars are expected to be better.   ? ?Patient is total dependent on daughter for care.  Patient permanently lives with her daughter and husband who provide care and transportation to appointments.  Patient ambulatory with a walker at this time.   ? ? ? ?Subjective: ?Courtney Goodman is an 76 y.o. 76 year old female who is a primary patient of [name]. The care management team was consulted for assistance with care management and/or care coordination needs.   ? ?Telephonic RN Care Manager completed Telephone Visit today. ? ?Objective:  ? ?Medications Reviewed Today   ? ? Reviewed by [name], RN (Case Manager) on 06/08/21 at [place] List Status: <None>  ? ?Medication Order Taking? Sig Documenting Provider Last Dose Status Informant  ?albuterol (PROVENTIL HFA;VENTOLIN HFA) 108 (90 Base) MCG/ACT inhaler [id number] Yes Inhale 2 puffs into the lungs every 6 (six) hours as needed for wheezing or shortness of breath. Courtney Rm, NP-C Taking Active Family Member  ?         ?Med Note Duffy Bruce, Para [place] May 31, 2021 12:27 PM) PROVIDER: The patient needs a NEW Rx, please- her medication has EXPIRED  ?Alcohol Swabs (ALCOHOL PREP) PADS [id number] Yes Use for testing  ?Patient taking differently: 1 each by Other route 2 (two) times daily. E11.9  ? Courtney Rm, NP-C Taking Active Family Member  ?aspirin 81 MG EC tablet [id number] Yes Take 1 tablet (81 mg total) by mouth daily. [name], MD Taking Active    ?Biotin 10000 MCG TABS [id number] Yes Take 10,000 mcg by mouth daily. [provider] Taking Active Family Member  ?carvedilol (COREG) 6.25 MG tablet [id number] Yes Take 1 tablet (6.25 mg total) by mouth 2 (two) times daily with a meal. [name], MD Taking Active   ?cefadroxil (DURICEF) 500 MG capsule [id number] Yes Take 2 capsules (1,000 mg total) by mouth 2 (two) times daily for 5 days. [name], MD Taking Active   ?Cholecalciferol 25 MCG (1000 UT) tablet [id number] Yes Take 1 tablet by mouth daily. Courtney Rm, NP-C Taking Active Family Member  ?Continuous Blood Gluc Receiver (FREESTYLE LIBRE 2 READER) DEVI [id number] No 1 each by Does not apply route daily.  ?Patient not taking: Reported on 06/08/2021  ? Courtney Kingdom, MD Not Taking Active   ?Continuous Blood Gluc Sensor (FREESTYLE LIBRE 2 SENSOR) MISC [id number] No 1 each by Does not apply route every 14 (fourteen) days.  ?Patient not taking: Reported on 06/08/2021  ? Courtney Kingdom, MD Not Taking Active   ?fenofibrate (TRICOR) 145 MG tablet [id number] Yes Take 1 tablet (145 mg total) by mouth daily. [name], PA-C Taking Active Family Member  ?folic acid (FOLVITE) 1 MG tablet [id number] Yes TAKE ONE TABLET BY MOUTH EVERY MORNING  ?Patient taking differently: Take 1 mg by mouth in the morning.  ? [name] Taking Active Family Member  ?Glucagon 3 MG/DOSE POWD [id number] Yes Place 3 mg into the nose once as  needed for up to 1 dose. Courtney Kingdom, MD Taking Active   ?glucose blood [facility] VERIO) test strip [id number] Yes 1 each by Other route 2 (two) times daily. And lancets 2/day Renato Shin, MD Taking Active Family Member  ?insulin aspart (FIASP FLEXTOUCH) 100 UNIT/ML FlexTouch Pen [id number] No Inject 5-7 Units into the skin 3 (three) times daily before meals.  ?Patient not taking: Reported on 06/08/2021  ? Courtney Kingdom, MD Not Taking Active   ?insulin degludec (TRESIBA FLEXTOUCH) 100 UNIT/ML FlexTouch Pen  [id number] No Inject 20 Units into the skin daily.  ?Patient not taking: Reported on 06/08/2021  ? Courtney Kingdom, MD Not Taking Active   ?insulin NPH Human (NOVOLIN N) 100 UNIT/ML injection [id number] Yes Inject 18-20 Units into the skin daily. [provider] Taking Active   ?Insulin Pen Needle 32G X 4 MM MISC [id number] Yes Use 4x a day Courtney Kingdom, MD Taking Active   ?Lancets (ONETOUCH DELICA PLUS [id number]) [place] [id number]  USE TO test four times A Marella Chimes, MD  Active Family Member  ?memantine [facility]) 10 MG tablet [id number] Yes Take 1 tablet (10 mg at night) for 2 weeks, then increase to 1 tablet (10 mg) twice a day  ?Patient taking differently: Take 10 mg by mouth in the morning and at bedtime.  ? [name], PA-C Taking Active Family Member  ?         ?Med Note Courtney Goodman May 31, 2021 12:21 PM)    ?omeprazole (PRILOSEC) 40 MG capsule [id number] Yes TAKE ONE CAPSULE BY MOUTH EVERY MORNING  ?Patient taking differently: 40 mg daily before breakfast.  ? [name], PA-C Taking Active Family Member  ?simvastatin (ZOCOR) 20 MG tablet [id number] Yes Take 1 tablet (20 mg total) by mouth at bedtime. [name], PA-C Taking Active Family Member  ? ?  ?  ? ?  ? ? ? ?SDOH:  (Social Determinants of Health) assessments and interventions performed:  ? ? ? ?Care Plan ? ?Review of patient past medical history, allergies, medications, health status, including review of consultants reports, laboratory and other test data, was performed as part of comprehensive evaluation for care management services.  ? ?Care Plan : RN Care Manager Plan of Care  ?Updates made by [name], RN since 06/08/2021 12:00 AM  ?  ? ?Problem: Chronic Disease Management and Care Coordination (Diabetes)   ?Priority: High  ?  ? ?Goal: Development of Plan of Care for Management of Sepsis   ?Start Date: 06/08/2021  ?Expected End Date: 07/28/2021  ?Priority: High  ?Note:   ?Current Barriers:  ?Knowledge  Deficits related to plan of care for management of Sepsis  ? ?RNCM Clinical Goal(s):  ?Patient will verbalize understanding of plan for management of Sepsis as evidenced by No reoccurence ?take all medications exactly as prescribed and will call provider for medication related questions as evidenced by Finishing antibiotics as ordered ?continue to work with RN Care Manager to address care management and care coordination needs related to  Sepsis as evidenced by adherence to CM Team Scheduled appointments through collaboration with RN Care manager, provider, and care team.  ? ?Interventions: ?Education and support related to sepsis ?Inter-disciplinary care team collaboration (see longitudinal plan of care) ?Evaluation of current treatment plan related to  self management and patient's adherence to plan as established by provider ? ? ?Sepsis  (Status:  New goal.)  Short Term Goal ?Evaluation of current treatment plan related to  Sepsis ,  self-management and patient's adherence to plan as established by provider. ?Discussed plans with patient for ongoing care management follow up and provided patient with direct contact information for care management team ?Evaluation of current treatment plan related to sepsis and patient's adherence to plan as established by provider ?Provided education to patient re: sepsis and signs to watch for ?Reviewed medications with patient and discussed medication changes ? ?Patient Goals/Self-Care Activities: Sepsis ?Take all medications as prescribed ?Attend all scheduled provider appointments ?Monitor for increase confusion, fever , chills, hypotension or shortness of breath.   ? ?Follow Up Plan:  Telephone follow up appointment with care management team member scheduled for:  later this month ?The patient has been provided with contact information for the care management team and has been advised to call with any health related questions or concerns.  ? ?  ? ?Long-Range Goal:  Development of Plan of Care for management of diabetes   ?Start Date: 11/29/2020  ?Expected End Date: 01/27/2022  ?This Visit's Progress: On track  ?Recent Progress: On track  ?Priority: High  ?Note:   ?Current Barriers:  ?

## 2021-06-08 NOTE — Patient Instructions (Addendum)
Patient Goals/Self-Care Activities: Sepsis ?Take all medications as prescribed ?Attend all scheduled provider appointments ?Monitor for increase confusion, fever , chills, hypotension or shortness of breath.   ? ?Patient Goals/Self-Care Activities: Diabetes ?check blood sugar at prescribed times: twice daily ?check feet daily for cuts, sores or redness ?enter blood sugar readings and medication or insulin into daily log ?take the blood sugar log to all doctor visits ?manage portion size ?

## 2021-06-11 ENCOUNTER — Inpatient Hospital Stay: Payer: Medicare HMO | Admitting: Physician Assistant

## 2021-06-11 ENCOUNTER — Telehealth: Payer: Self-pay | Admitting: Physician Assistant

## 2021-06-11 NOTE — Telephone Encounter (Signed)
Patient can have all of the above items. Thanks.

## 2021-06-11 NOTE — Telephone Encounter (Signed)
Courtney Goodman from [facility] called and requesting pt for 1 time a week for 8 weeks ?A social woker evualtion  ?And a hopsital bed  ?She needs a verbal but is sending over a rx for the hospital bed ?She can be reached at [phone] ?

## 2021-06-11 NOTE — Telephone Encounter (Signed)
Thank you :)

## 2021-06-12 ENCOUNTER — Other Ambulatory Visit (HOSPITAL_COMMUNITY): Payer: Self-pay

## 2021-06-12 ENCOUNTER — Telehealth: Payer: Self-pay | Admitting: Pharmacy Technician

## 2021-06-12 NOTE — Telephone Encounter (Signed)
Patient Advocate Encounter ?  ?Received notification from [facility] that prior authorization for Insulin Degludec is required by his/her insurance [employer]. ? ?Per Test Claim: Tresiba filled 06/11/21. No PA needed for generic.  ?  ?Key [id number] ? ? ?

## 2021-06-12 NOTE — Telephone Encounter (Signed)
Patient Advocate Encounter ?  ?Received notification from [employer]MyMeds that prior authorization for Freestyle Elenor [place] is required by his/her insurance [employer]. ? ?Per Test Claim: Not covered under part D. CGM COV:[facility] [phone] OR EHC [phone] ?That's [facility].   ? ?Key [id number] (reader), [id number] (sensors) Archived In CMM ? ?

## 2021-06-14 NOTE — Telephone Encounter (Signed)
DME supplies ordered via [employer] through online portal.

## 2021-06-15 ENCOUNTER — Other Ambulatory Visit: Payer: Self-pay | Admitting: Physician Assistant

## 2021-06-15 ENCOUNTER — Telehealth: Payer: Self-pay | Admitting: Physician Assistant

## 2021-06-15 DIAGNOSIS — J209 Acute bronchitis, unspecified: Secondary | ICD-10-CM

## 2021-06-15 DIAGNOSIS — J449 Chronic obstructive pulmonary disease, unspecified: Secondary | ICD-10-CM

## 2021-06-15 MED ORDER — ALBUTEROL SULFATE HFA 108 (90 BASE) MCG/ACT IN AERS: 1.0000 | INHALATION_SPRAY | Freq: Four times a day (QID) | RESPIRATORY_TRACT | 1 refills | Status: AC | PRN

## 2021-06-15 NOTE — Telephone Encounter (Signed)
Refill ordered

## 2021-06-15 NOTE — Telephone Encounter (Signed)
Pt call for refills of albuterol. [facility]. Pt can be reached at [phone]

## 2021-06-18 ENCOUNTER — Other Ambulatory Visit: Payer: Self-pay

## 2021-06-18 NOTE — Patient Outreach (Signed)
[facility]) Care Management  06/18/2021  Courtney Goodman 1945/05/26 [id number]   Telephone call to daughter Courtney Goodman.  She reports patient has her days.  She still reports weakness. Patient working with home health PT at this time weekly. Patient blood sugars are better less than 200.  No concerns.     Care Plan : RN Care Manager Plan of Care  Updates made by [name], RN since 06/18/2021 12:00 AM     Problem: Chronic Disease Management and Care Coordination (Diabetes)   Priority: High     Goal: Development of Plan of Care for Management of Sepsis   Start Date: 06/08/2021  Expected End Date: 07/28/2021  This Visit's Progress: On track  Priority: High  Note:   Current Barriers:  Knowledge Deficits related to plan of care for management of Sepsis   RNCM Clinical Goal(s):  Patient will verbalize understanding of plan for management of Sepsis as evidenced by No reoccurence take all medications exactly as prescribed and will call provider for medication related questions as evidenced by Finishing antibiotics as ordered continue to work with RN Care Manager to address care management and care coordination needs related to  Sepsis as evidenced by adherence to CM Team Scheduled appointments through collaboration with RN Care manager, provider, and care team.   Interventions: Education and support related to sepsis Inter-disciplinary care team collaboration (see longitudinal plan of care) Evaluation of current treatment plan related to  self management and patient's adherence to plan as established by provider   Sepsis  (Status:  Goal on track:  Yes.)  Short Term Goal Evaluation of current treatment plan related to  Sepsis ,  self-management and patient's adherence to plan as established by provider. Discussed plans with patient for ongoing care management follow up and provided patient with direct contact information for care management team Evaluation of current  treatment plan related to sepsis and patient's adherence to plan as established by provider Provided education to patient re: sepsis and signs to watch for Reviewed medications with patient and discussed medication changes  Patient Goals/Self-Care Activities: Sepsis Take all medications as prescribed Attend all scheduled provider appointments Monitor for increase confusion, fever , chills, hypotension or shortness of breath.    06/18/21 spoke with daughter patient still has some weakness.  Patient has home health PT once per week.  Discussed getting patient up daily to help with weakness and continue with exercises given by therapy.   Follow Up Plan:  Telephone follow up appointment with care management team member scheduled for:  June The patient has been provided with contact information for the care management team and has been advised to call with any health related questions or concerns.      Long-Range Goal: Development of Plan of Care for management of diabetes   Start Date: 11/29/2020  Expected End Date: 01/27/2022  This Visit's Progress: On track  Recent Progress: On track  Priority: High  Note:   Current Barriers:  Chronic Disease Management support and education needs related to DMII  Unable to independently self manage diabetes  RNCM Clinical Goal(s):  Patient will verbalize understanding of plan for management of DMII verbalize basic understanding of  DMII disease process and self health management plan for diabetes take all medications exactly as prescribed and will call provider for medication related questions continue to work with RN Care Manager to address care management and care coordination needs related to  DMII through collaboration with RN Care manager, provider,  and care team.   Interventions: "Provided education to patient re: diabetes, diabetes diet, complications of uncontrolled blood sugar Inter-disciplinary care team collaboration (see longitudinal plan  of care) Evaluation of current treatment plan related to  self management and patient's adherence to plan as established by provider   Diabetes Interventions: Assessed patient's understanding of A1c goal: <8% Provided education to patient about basic DM disease process; Discussed plans with patient for ongoing care management follow up and provided patient with direct contact information for care management team; Diabetes Management Discussed Medication adherence, Reviewed importance of limiting carbs such as rice, potatoes, breads, and pastas. Also discussed limiting sweets and sugary drinks.  Discussed importance of portion control.  Also discussed importance of annual exams, foot exams, and eye exams.   Lab Results  Component Value Date   HGBA1C 10.6 (A) 09/12/2020   Patient Goals/Self-Care Activities: Diabetes check blood sugar at prescribed times: three times daily check feet daily for cuts, sores or redness enter blood sugar readings and medication or insulin into daily log take the blood sugar log to all doctor visits manage portion size   04/16/21 Patient now living with her daughter and her husband.  Patient requires total care for all aspects of care.  Daughter gives and manages medications.  Blood sugars less than 200.  No lows reported.    06/08/21 Patient recently hospitalized for sepsis related pancreatitis from [place] per daughter Courtney Goodman.  Patient with recent changed in medications as result.  Medication review done with daughter.  Right now blood sugars in 200-250 range.  However, with recent medication changes sugar control has been higher.  Patient saw endocrinologist on yesterday.  Discussed sepsis and diabetes control.  Home health has also been ordered.  Evaluation done 06/07/21  06/18/21 Daughter reports patient sugars are much better.  Patient has started on her new medications that were changed on last endocrinologist visit. Blood sugars now less than 200.  Patient  waiting on authorization for the [facility] system for blood sugar monitoring.  No concerns.    Follow Up Plan:  Telephone follow up appointment with care management team member scheduled for:  June The patient has been provided with contact information for the care management team and has been advised to call with any health related questions or concerns.      Plan: Follow-up: Patient agrees to Care Plan and Follow-up. Follow-up in June.    Jone Baseman, RN, MSN [facility] Care Management Care Management Coordinator Direct Line [phone] Toll Free: [phone]  Fax: [phone]

## 2021-06-20 ENCOUNTER — Telehealth (INDEPENDENT_AMBULATORY_CARE_PROVIDER_SITE_OTHER): Payer: Medicare HMO | Admitting: Medical

## 2021-06-20 VITALS — Wt 135.0 lb

## 2021-06-20 DIAGNOSIS — G9341 Metabolic encephalopathy: Secondary | ICD-10-CM | POA: Diagnosis not present

## 2021-06-20 DIAGNOSIS — R109 Unspecified abdominal pain: Secondary | ICD-10-CM

## 2021-06-20 DIAGNOSIS — N1831 Chronic kidney disease, stage 3a: Secondary | ICD-10-CM

## 2021-06-20 DIAGNOSIS — E43 Unspecified severe protein-calorie malnutrition: Secondary | ICD-10-CM | POA: Diagnosis not present

## 2021-06-20 DIAGNOSIS — R4182 Altered mental status, unspecified: Secondary | ICD-10-CM

## 2021-06-20 DIAGNOSIS — R69 Illness, unspecified: Secondary | ICD-10-CM

## 2021-06-20 DIAGNOSIS — E1122 Type 2 diabetes mellitus with diabetic chronic kidney disease: Secondary | ICD-10-CM

## 2021-06-20 DIAGNOSIS — N184 Chronic kidney disease, stage 4 (severe): Secondary | ICD-10-CM | POA: Diagnosis not present

## 2021-06-20 DIAGNOSIS — R413 Other amnesia: Secondary | ICD-10-CM

## 2021-06-20 DIAGNOSIS — K5900 Constipation, unspecified: Secondary | ICD-10-CM

## 2021-06-20 DIAGNOSIS — N179 Acute kidney failure, unspecified: Secondary | ICD-10-CM

## 2021-06-20 DIAGNOSIS — Z794 Long term (current) use of insulin: Secondary | ICD-10-CM

## 2021-06-20 NOTE — Progress Notes (Signed)
Subjective:     Patient ID: Courtney Goodman, female   DOB: 02-Jul-1945, 76 y.o.   MRN: [id number]  This visit type was conducted due to national recommendations for restrictions regarding the COVID-19 Pandemic (e.g. social distancing) in an effort to limit this patient's exposure and mitigate transmission in our community.  Due to their co-morbid illnesses, this patient is at least at moderate risk for complications without adequate follow up.  This format is felt to be most appropriate for this patient at this time.    Documentation for virtual audio and video telecommunications through [facility] encounter:  The patient was located at home. The provider was located in the office. The patient did consent to this visit and is aware of possible charges through their insurance for this visit.  The other persons participating in this telemedicine service were Courtney Goodman, daughter of Courtney Goodman.  Time spent on call was 20 minutes and in review of previous records 20 minutes total.  This virtual service is not related to other E/M service within previous 7 days.   HPI Chief Complaint  Patient presents with   trouble using bathroom    Trouble using bathroom. No BM since may 12th. Did have a small one this morning.    Virtual consult for constipation.  The visit was mainly conducted with her daughter Courtney Goodman.    She has concerns about constipation.  She already has an appointment with gastroenterology coming up within the next few weeks.  However, she has not had any bowel movement in the last 10 days except for some mushy brown stool this morning.  It was not pellets or real dry but just mushy brown.  She does drink water daily, at least 316 ounce bottles daily.  She is able to talk and converse but she requires total care currently.  They have had physical therapy out to the house to help her with mobilization.  Prior to last week she was walking in her room some but so far this week not  getting up and moving around very much.  If they try to help she may fall to her knees.    She often complains of pain in general so Courtney Goodman is not sure if her belly specifically hurts her she describes in general.  For example anywhere she touches her mother she says it hurts.  This can include the abdomen.  She denies her abdomen feeling rigid though.  No fever, no vomiting, no blood in the stool.  They tried a few teaspoons of MiraLAX a few days ago without much help.  They did a regular dose of MiraLAX yesterday.  Eating 3 meals daily.  She is not on pain medication.  Her medical history includes chronic kidney disease, hyperlipidemia, anemia, COPD, hypertension, diabetes, coronary artery disease, atherosclerosis, protein calorie malnutrition, memory difficulties, depression, and electrolyte disturbance.  She just had a recent hospitalization, May 4 through Jun 06, 2021 [facility] for altered mental status, uncontrolled diabetes, metabolic encephalopathy, acute renal failure superimposed on stage IV CKD, septic shock due to Klebsiella pneumonia, protein calorie malnutrition and pancreatic mass  Prior hospitalization discharge summary she was supposed to have follow-up in 1 to 2 weeks after discharge, repeat basic metabolic panel and CBC, follow-up with gastroenterology in 4 to 6 weeks with plan to repeat CT abdomen   Past Medical History:  Diagnosis Date   Anemia of chronic disease    Asthma    Atherosclerotic heart disease of native coronary  artery without angina pectoris    CKD (chronic kidney disease)    COPD (chronic obstructive pulmonary disease) (HCC)    DM (diabetes mellitus), type 2 with renal complications (HCC)    Dysrhythmia    Elevated ferritin 01/03/2017   GAD (generalized anxiety disorder)    Hypertension    Mixed hyperlipidemia due to type 2 diabetes mellitus (HCC)    Mixed incontinence    per medical records from [facility]   Osteopenia    Personal history of  noncompliance with medical treatment, presenting hazards to health    Shortness of breath dyspnea    Suicidal ideation    TB (tuberculosis), treated    age 9   Vitamin D deficiency    Current Outpatient Medications on File Prior to Visit  Medication Sig Dispense Refill   albuterol (VENTOLIN HFA) 108 (90 Base) MCG/ACT inhaler Inhale 1-2 puffs into the lungs every 6 (six) hours as needed for wheezing or shortness of breath. 18 each 1   aspirin 81 MG EC tablet Take 1 tablet (81 mg total) by mouth daily. 30 tablet 0   Biotin 10000 MCG TABS Take 10,000 mcg by mouth daily.     carvedilol (COREG) 6.25 MG tablet Take 1 tablet (6.25 mg total) by mouth 2 (two) times daily with a meal. 60 tablet 0   Cholecalciferol 25 MCG (1000 UT) tablet Take 1 tablet by mouth daily. 90 tablet 0   fenofibrate (TRICOR) 145 MG tablet Take 1 tablet (145 mg total) by mouth daily. 30 tablet 2   insulin degludec (TRESIBA FLEXTOUCH) 100 UNIT/ML FlexTouch Pen Inject 20 Units into the skin daily. 9 mL 5   insulin NPH Human (NOVOLIN N) 100 UNIT/ML injection Inject 18-20 Units into the skin daily.     Insulin Pen Needle 32G X 4 MM MISC Use 4x a day 300 each 3   Lancets (ONETOUCH DELICA PLUS XBMWUX32G) MISC USE TO test four times A DAY 400 each 0   memantine (NAMENDA) 10 MG tablet Take 1 tablet (10 mg at night) for 2 weeks, then increase to 1 tablet (10 mg) twice a day (Patient taking differently: Take 10 mg by mouth in the morning and at bedtime.) 60 tablet 11   omeprazole (PRILOSEC) 40 MG capsule TAKE ONE CAPSULE BY MOUTH EVERY MORNING (Patient taking differently: 40 mg daily before breakfast.) 30 capsule 2   simvastatin (ZOCOR) 20 MG tablet Take 1 tablet (20 mg total) by mouth at bedtime. 90 tablet 1   Alcohol Swabs (ALCOHOL PREP) PADS Use for testing (Patient taking differently: 1 each by Other route 2 (two) times daily. E11.9) 100 each 1   Continuous Blood Gluc Receiver (FREESTYLE LIBRE 2 READER) DEVI 1 each by Does not apply  route daily. (Patient not taking: Reported on 06/08/2021) 1 each 0   Continuous Blood Gluc Sensor (FREESTYLE LIBRE 2 SENSOR) MISC 1 each by Does not apply route every 14 (fourteen) days. (Patient not taking: Reported on 04/29/270) 6 each 3   folic acid (FOLVITE) 1 MG tablet TAKE ONE TABLET BY MOUTH EVERY MORNING (Patient taking differently: Take 1 mg by mouth in the morning.) 30 tablet 2   Glucagon 3 MG/DOSE POWD Place 3 mg into the nose once as needed for up to 1 dose. 1 each 11   glucose blood (ONETOUCH VERIO) test strip 1 each by Other route 2 (two) times daily. And lancets 2/day 100 each 12   insulin aspart (FIASP FLEXTOUCH) 100 UNIT/ML FlexTouch Pen Inject 5-7  Units into the skin 3 (three) times daily before meals. (Patient not taking: Reported on 06/08/2021) 15 mL 5   No current facility-administered medications on file prior to visit.     Review of Systems As in subjective    Objective:   Physical Exam Due to coronavirus pandemic stay at home measures, patient visit was virtual and they were not examined in person.   Wt 135 lb (61.2 kg)   BMI 23.91 kg/m   General: Seated, no acute distress     Assessment:     Encounter Diagnoses  Name Primary?   Constipation, unspecified constipation type Yes   Type 2 diabetes mellitus with stage 3a chronic kidney disease, with long-term current use of insulin (HCC)    Protein-calorie malnutrition, severe    Multiple comorbid conditions    Memory difficulties    CKD (chronic kidney disease), stage IV (HCC)    Acute metabolic encephalopathy    Acute renal failure superimposed on stage 4 chronic kidney disease, unspecified acute renal failure type (HCC)    Altered mental status, unspecified altered mental status type    Abdominal discomfort        Plan:     I reviewed her recent hospitalization discharge summary, labs, imaging, and medicines reconciled.  We discussed her current concerns.  She is reliant upon her daughter for full care  currently.  She does not have a home health aide.  She does have home physical therapy coming in currently to help with mobilization  We will try to get referral right away for home health nurse to come out and evaluate, to a rectal exam to check for impaction of stool and to gauge her current situation.  She is also due for labs from hospital follow-up so we will try to get some lab work ordered while the nurse is out at the home.  I discussed with her daughter Courtney Goodman who is her caregiver to use some lubrication and tried to feel for impaction or work on doing a saline enema to see if that helps.  I advised they need to do an over-the-counter saline enema today.  She can also use 1 capful or 1 dose of MiraLAX oral daily for now.  However advised if she has dry impacted stool, worse abdominal pain, worse symptoms overall to go to the emergency department.  We discussed the potential for diabetic gastroparesis as well.  She has uncontrolled diabetes, not very mobile at the moment in multiple comorbidities, homebound.  I reviewed her recent endocrinology notes as well.  She does have follow-up soon with gastroenterology.  There is a plan to repeat CT seen for surveillance of the pancreatic area of concern that was thought to be possibly a walled off area of infection or inflammation, more likely than a mass.   Courtney Goodman was seen today for trouble using bathroom.  Diagnoses and all orders for this visit:  Constipation, unspecified constipation type  Type 2 diabetes mellitus with stage 3a chronic kidney disease, with long-term current use of insulin (HCC)  Protein-calorie malnutrition, severe  Multiple comorbid conditions  Memory difficulties  CKD (chronic kidney disease), stage IV (HCC)  Acute metabolic encephalopathy  Acute renal failure superimposed on stage 4 chronic kidney disease, unspecified acute renal failure type (HCC)  Altered mental status, unspecified altered mental status  type  Abdominal discomfort -     Comprehensive metabolic panel; Future -     CBC; Future -     Lipase; Future  F/u pending home health, labs

## 2021-06-20 NOTE — Addendum Note (Signed)
Addended by: Minette Headland A on: 06/20/2021 02:25 PM   Modules accepted: Orders

## 2021-06-21 ENCOUNTER — Telehealth: Payer: Self-pay

## 2021-06-21 NOTE — Telephone Encounter (Signed)
OT 1 time a week for 4 weeks is fine. Thank you.

## 2021-06-21 NOTE — Telephone Encounter (Signed)
A nurse with [facility] called they wanted a verbal order for OT 1 time a week for 4 weeks. She can be reached at [phone]. I can call back if verbal order is ok.

## 2021-06-26 ENCOUNTER — Telehealth: Payer: Self-pay | Admitting: Internal Medicine

## 2021-06-26 NOTE — Telephone Encounter (Signed)
Pt's daughter called and said that a guy showed up Saturday unannounced with no appointment wanting to see patient. Pt was not home at the time so vitals or Labs could be drawn. Pt daughter is not sure who the guy name was or what company he was from. Can you find out if it was Christain from [employer] and see when he can go back out. Patient did have multiple bowel movements and is fine on that.

## 2021-06-26 NOTE — Telephone Encounter (Signed)
Tried to call pt but vm not set up

## 2021-06-27 ENCOUNTER — Inpatient Hospital Stay: Payer: Medicare HMO

## 2021-06-27 ENCOUNTER — Inpatient Hospital Stay: Payer: Medicare HMO | Attending: Oncology

## 2021-06-27 DIAGNOSIS — I2510 Atherosclerotic heart disease of native coronary artery without angina pectoris: Secondary | ICD-10-CM | POA: Diagnosis not present

## 2021-06-27 DIAGNOSIS — N184 Chronic kidney disease, stage 4 (severe): Secondary | ICD-10-CM | POA: Diagnosis not present

## 2021-06-27 DIAGNOSIS — F0393 Unspecified dementia, unspecified severity, with mood disturbance: Secondary | ICD-10-CM | POA: Diagnosis not present

## 2021-06-27 DIAGNOSIS — E1169 Type 2 diabetes mellitus with other specified complication: Secondary | ICD-10-CM | POA: Diagnosis not present

## 2021-06-27 DIAGNOSIS — I959 Hypotension, unspecified: Secondary | ICD-10-CM | POA: Diagnosis not present

## 2021-06-27 DIAGNOSIS — E1165 Type 2 diabetes mellitus with hyperglycemia: Secondary | ICD-10-CM | POA: Diagnosis not present

## 2021-06-27 DIAGNOSIS — I129 Hypertensive chronic kidney disease with stage 1 through stage 4 chronic kidney disease, or unspecified chronic kidney disease: Secondary | ICD-10-CM | POA: Diagnosis not present

## 2021-06-27 DIAGNOSIS — E1122 Type 2 diabetes mellitus with diabetic chronic kidney disease: Secondary | ICD-10-CM | POA: Diagnosis not present

## 2021-06-27 DIAGNOSIS — K869 Disease of pancreas, unspecified: Secondary | ICD-10-CM | POA: Diagnosis not present

## 2021-06-27 DIAGNOSIS — J449 Chronic obstructive pulmonary disease, unspecified: Secondary | ICD-10-CM | POA: Diagnosis not present

## 2021-06-27 NOTE — Telephone Encounter (Signed)
Spoke to [place] today and PT is there right now and Aylssa a nurse will be out tomorrow for labs.

## 2021-06-28 DIAGNOSIS — E1122 Type 2 diabetes mellitus with diabetic chronic kidney disease: Secondary | ICD-10-CM | POA: Diagnosis not present

## 2021-06-29 ENCOUNTER — Telehealth: Payer: Self-pay | Admitting: Physician Assistant

## 2021-06-29 NOTE — Telephone Encounter (Signed)
Courtney Goodman from [facility] called and states pt did not have OT today pt daughter did not want her to have it due to them having furniture delivered said it would be to much on her She can be reached at [phone]

## 2021-07-02 NOTE — Telephone Encounter (Signed)
The nurse did come out and get labs drawn. Can we see if we can get those results

## 2021-07-02 NOTE — Telephone Encounter (Signed)
Please advise. Not sure what to do about this patient if daughter isn't answering phone to let someone in

## 2021-07-03 ENCOUNTER — Ambulatory Visit: Payer: Medicare HMO | Admitting: Gastroenterology

## 2021-07-11 ENCOUNTER — Other Ambulatory Visit: Payer: Self-pay | Admitting: Physician Assistant

## 2021-07-11 ENCOUNTER — Telehealth: Payer: Self-pay | Admitting: Physician Assistant

## 2021-07-11 ENCOUNTER — Other Ambulatory Visit: Payer: Self-pay

## 2021-07-11 NOTE — Patient Outreach (Signed)
[place] [facility]) Care Management  07/11/2021  [place] Berenguer Luhn [birth year] [id number]   Telephone call to daughter [name]. She reports that patient is doing very well. Blood sugars better controlled. Sepsis resolved.  No concerns.    Care Plan : RN Care Manager Plan of Care  Updates made by [name], RN since 07/11/2021 12:00 AM     Problem: Chronic Disease Management and Care Coordination (Diabetes)   Priority: High     Goal: Development of Plan of Care for Management of Sepsis Completed 07/11/2021  Start Date: 06/08/2021  Expected End Date: 07/28/2021  This Visit's Progress: On track  Recent Progress: On track  Priority: High  Note:   Current Barriers:  Knowledge Deficits related to plan of care for management of Sepsis   RNCM Clinical Goal(s):  Patient will verbalize understanding of plan for management of Sepsis as evidenced by No reoccurence take all medications exactly as prescribed and will call provider for medication related questions as evidenced by Finishing antibiotics as ordered continue to work with RN Care Manager to address care management and care coordination needs related to  Sepsis as evidenced by adherence to CM Team Scheduled appointments through collaboration with RN Care manager, provider, and care team.   Interventions: Education and support related to sepsis Inter-disciplinary care team collaboration (see longitudinal plan of care) Evaluation of current treatment plan related to  self management and patient's adherence to plan as established by provider   Sepsis  (Status:  Goal on track:  Yes.)  Short Term Goal Evaluation of current treatment plan related to  Sepsis ,  self-management and patient's adherence to plan as established by provider. Discussed plans with patient for ongoing care management follow up and provided patient with direct contact information for care management team Evaluation of current treatment plan related to sepsis  and patient's adherence to plan as established by provider Provided education to patient re: sepsis and signs to watch for Reviewed medications with patient and discussed medication changes  Patient Goals/Self-Care Activities: Sepsis Take all medications as prescribed Attend all scheduled provider appointments Monitor for increase confusion, fever , chills, hypotension or shortness of breath.    06/18/21 spoke with daughter patient still has some weakness.  Patient has home health PT once per week.  Discussed getting patient up daily to help with weakness and continue with exercises given by therapy.    07/11/21 Daughter reports that patient  is doing better and sepsis is resolved. Home Health continues.   Resolving goal  Follow Up Plan:  Telephone follow up appointment with care management team member scheduled for:  June The patient has been provided with contact information for the care management team and has been advised to call with any health related questions or concerns.      Long-Range Goal: Development of Plan of Care for management of diabetes   Start Date: 11/29/2020  Expected End Date: 01/27/2022  This Visit's Progress: On track  Recent Progress: On track  Priority: High  Note:   Current Barriers:  Chronic Disease Management support and education needs related to DMII  Unable to independently self manage diabetes  RNCM Clinical Goal(s):  Patient will verbalize understanding of plan for management of DMII verbalize basic understanding of  DMII disease process and self health management plan for diabetes take all medications exactly as prescribed and will call provider for medication related questions continue to work with RN Care Manager to address care management and care coordination needs  related to  DMII through collaboration with RN Care manager, provider, and care team.   Interventions: "Provided education to patient re: diabetes, diabetes diet, complications of  uncontrolled blood sugar Inter-disciplinary care team collaboration (see longitudinal plan of care) Evaluation of current treatment plan related to  self management and patient's adherence to plan as established by provider   Diabetes Interventions: Assessed patient's understanding of A1c goal: <8% Provided education to patient about basic DM disease process; Discussed plans with patient for ongoing care management follow up and provided patient with direct contact information for care management team; Diabetes Management Discussed Medication adherence, Reviewed importance of limiting carbs such as rice, potatoes, breads, and pastas. Also discussed limiting sweets and sugary drinks.  Discussed importance of portion control.  Also discussed importance of annual exams, foot exams, and eye exams.   Lab Results  Component Value Date   HGBA1C 10.6 (A) 09/12/2020   Patient Goals/Self-Care Activities: Diabetes check blood sugar at prescribed times: three times daily check feet daily for cuts, sores or redness enter blood sugar readings and medication or insulin into daily log take the blood sugar log to all doctor visits manage portion size   04/16/21 Patient now living with her daughter and her husband.  Patient requires total care for all aspects of care.  Daughter gives and manages medications.  Blood sugars less than 200.  No lows reported.    06/08/21 Patient recently hospitalized for sepsis related pancreatitis from [facility] per daughter [name].  Patient with recent changed in medications as result.  Medication review done with daughter.  Right now blood sugars in 200-250 range.  However, with recent medication changes sugar control has been higher.  Patient saw endocrinologist on yesterday.  Discussed sepsis and diabetes control.  Home health has also been ordered.  Evaluation done 06/07/21  06/18/21 Daughter reports patient sugars are much better.  Patient has started on her new medications  that were changed on last endocrinologist visit. Blood sugars now less than 200.  Patient waiting on authorization for the [facility] system for blood sugar monitoring.  No concerns.    07/11/21 Spoke with daughter [name]. She reports that patient is actually doing well with her diabetes. Blood sugars are less than 190 now.  No concerns.    Follow Up Plan:  Telephone follow up appointment with care management team member scheduled for:  July The patient has been provided with contact information for the care management team and has been advised to call with any health related questions or concerns.      Plan: Follow-up: Patient agrees to Care Plan and Follow-up. Follow-up in July.  Jone Baseman, RN, MSN [facility] Care Management Care Management Coordinator Direct Line [phone] Toll Free: [phone]  Fax: [phone]

## 2021-07-11 NOTE — Telephone Encounter (Signed)
Lvm for Melissa to ok verbal for O/T. [facility]

## 2021-07-11 NOTE — Telephone Encounter (Signed)
Courtney Goodman from [facility] for [place], states she needs an order for 1 time a week for 4 weeks. States you can leave the verbal on her vm if needed, it I secure. Her number is [phone]

## 2021-07-16 ENCOUNTER — Telehealth: Payer: Self-pay | Admitting: Physician Assistant

## 2021-07-16 NOTE — Telephone Encounter (Signed)
Please advise 

## 2021-07-16 NOTE — Telephone Encounter (Signed)
[name] called in says Courtney Goodman never got her blood work results from 2 weeks ago.  Also she says she doesn't know if she should tell her primary doctor or neurology doctor about her waking up crying a lot and she's concerned.

## 2021-07-16 NOTE — Telephone Encounter (Signed)
Kidney function was abnormal - follow up with Kidney specialist Blood counts abnormal with elevated WBC and low hemoglobin - follow up with Oncologist  For waking up crying - if needs help immediately, then go to the Emergency Department; otherwise can make an in-office appointment with me or can discuss with Neurology

## 2021-07-17 NOTE — Telephone Encounter (Signed)
Thank you :)

## 2021-07-18 ENCOUNTER — Inpatient Hospital Stay: Payer: Medicare HMO | Attending: Oncology

## 2021-07-18 ENCOUNTER — Inpatient Hospital Stay: Payer: Medicare HMO

## 2021-07-27 ENCOUNTER — Encounter: Payer: Self-pay | Admitting: Internal Medicine

## 2021-07-31 ENCOUNTER — Other Ambulatory Visit: Payer: Self-pay | Admitting: Physician Assistant

## 2021-07-31 DIAGNOSIS — E1122 Type 2 diabetes mellitus with diabetic chronic kidney disease: Secondary | ICD-10-CM

## 2021-08-01 ENCOUNTER — Encounter: Payer: Self-pay | Admitting: Physician Assistant

## 2021-08-01 ENCOUNTER — Inpatient Hospital Stay: Payer: Medicare HMO

## 2021-08-01 ENCOUNTER — Ambulatory Visit (INDEPENDENT_AMBULATORY_CARE_PROVIDER_SITE_OTHER): Payer: Medicare HMO | Admitting: Physician Assistant

## 2021-08-01 ENCOUNTER — Other Ambulatory Visit: Payer: Self-pay

## 2021-08-01 ENCOUNTER — Inpatient Hospital Stay: Payer: Medicare HMO | Attending: Oncology

## 2021-08-01 VITALS — BP 130/80 | HR 74 | Resp 18 | Ht 65.0 in | Wt 135.0 lb

## 2021-08-01 VITALS — BP 110/65 | HR 70 | Resp 16

## 2021-08-01 DIAGNOSIS — G319 Degenerative disease of nervous system, unspecified: Secondary | ICD-10-CM | POA: Diagnosis not present

## 2021-08-01 DIAGNOSIS — D638 Anemia in other chronic diseases classified elsewhere: Secondary | ICD-10-CM | POA: Diagnosis not present

## 2021-08-01 DIAGNOSIS — R0689 Other abnormalities of breathing: Secondary | ICD-10-CM | POA: Diagnosis not present

## 2021-08-01 DIAGNOSIS — D649 Anemia, unspecified: Secondary | ICD-10-CM | POA: Diagnosis not present

## 2021-08-01 DIAGNOSIS — F0390 Unspecified dementia without behavioral disturbance: Secondary | ICD-10-CM

## 2021-08-01 DIAGNOSIS — E1122 Type 2 diabetes mellitus with diabetic chronic kidney disease: Secondary | ICD-10-CM | POA: Diagnosis present

## 2021-08-01 DIAGNOSIS — G9341 Metabolic encephalopathy: Secondary | ICD-10-CM | POA: Diagnosis not present

## 2021-08-01 DIAGNOSIS — R404 Transient alteration of awareness: Secondary | ICD-10-CM | POA: Diagnosis not present

## 2021-08-01 DIAGNOSIS — I69354 Hemiplegia and hemiparesis following cerebral infarction affecting left non-dominant side: Secondary | ICD-10-CM | POA: Diagnosis not present

## 2021-08-01 DIAGNOSIS — D6959 Other secondary thrombocytopenia: Secondary | ICD-10-CM | POA: Diagnosis present

## 2021-08-01 DIAGNOSIS — Z9911 Dependence on respirator [ventilator] status: Secondary | ICD-10-CM | POA: Diagnosis not present

## 2021-08-01 DIAGNOSIS — J479 Bronchiectasis, uncomplicated: Secondary | ICD-10-CM | POA: Diagnosis not present

## 2021-08-01 DIAGNOSIS — N184 Chronic kidney disease, stage 4 (severe): Secondary | ICD-10-CM | POA: Diagnosis not present

## 2021-08-01 DIAGNOSIS — R569 Unspecified convulsions: Secondary | ICD-10-CM | POA: Diagnosis not present

## 2021-08-01 DIAGNOSIS — R7881 Bacteremia: Secondary | ICD-10-CM | POA: Diagnosis not present

## 2021-08-01 DIAGNOSIS — J189 Pneumonia, unspecified organism: Secondary | ICD-10-CM | POA: Diagnosis present

## 2021-08-01 DIAGNOSIS — R401 Stupor: Secondary | ICD-10-CM | POA: Diagnosis not present

## 2021-08-01 DIAGNOSIS — M19011 Primary osteoarthritis, right shoulder: Secondary | ICD-10-CM | POA: Diagnosis not present

## 2021-08-01 DIAGNOSIS — F419 Anxiety disorder, unspecified: Secondary | ICD-10-CM | POA: Diagnosis not present

## 2021-08-01 DIAGNOSIS — G062 Extradural and subdural abscess, unspecified: Secondary | ICD-10-CM | POA: Diagnosis not present

## 2021-08-01 DIAGNOSIS — R601 Generalized edema: Secondary | ICD-10-CM | POA: Diagnosis not present

## 2021-08-01 DIAGNOSIS — Z515 Encounter for palliative care: Secondary | ICD-10-CM | POA: Diagnosis not present

## 2021-08-01 DIAGNOSIS — J9690 Respiratory failure, unspecified, unspecified whether with hypoxia or hypercapnia: Secondary | ICD-10-CM | POA: Diagnosis not present

## 2021-08-01 DIAGNOSIS — E870 Hyperosmolality and hypernatremia: Secondary | ICD-10-CM | POA: Diagnosis not present

## 2021-08-01 DIAGNOSIS — R64 Cachexia: Secondary | ICD-10-CM | POA: Diagnosis present

## 2021-08-01 DIAGNOSIS — I639 Cerebral infarction, unspecified: Secondary | ICD-10-CM | POA: Diagnosis not present

## 2021-08-01 DIAGNOSIS — E8770 Fluid overload, unspecified: Secondary | ICD-10-CM | POA: Diagnosis not present

## 2021-08-01 DIAGNOSIS — R4182 Altered mental status, unspecified: Secondary | ICD-10-CM | POA: Diagnosis present

## 2021-08-01 DIAGNOSIS — I10 Essential (primary) hypertension: Secondary | ICD-10-CM | POA: Diagnosis not present

## 2021-08-01 DIAGNOSIS — R627 Adult failure to thrive: Secondary | ICD-10-CM | POA: Diagnosis not present

## 2021-08-01 DIAGNOSIS — J440 Chronic obstructive pulmonary disease with acute lower respiratory infection: Secondary | ICD-10-CM | POA: Diagnosis present

## 2021-08-01 DIAGNOSIS — R579 Shock, unspecified: Secondary | ICD-10-CM | POA: Diagnosis not present

## 2021-08-01 DIAGNOSIS — R509 Fever, unspecified: Secondary | ICD-10-CM | POA: Diagnosis not present

## 2021-08-01 DIAGNOSIS — M19012 Primary osteoarthritis, left shoulder: Secondary | ICD-10-CM | POA: Diagnosis not present

## 2021-08-01 DIAGNOSIS — F0150 Vascular dementia without behavioral disturbance: Secondary | ICD-10-CM

## 2021-08-01 DIAGNOSIS — I129 Hypertensive chronic kidney disease with stage 1 through stage 4 chronic kidney disease, or unspecified chronic kidney disease: Secondary | ICD-10-CM | POA: Diagnosis present

## 2021-08-01 DIAGNOSIS — E7220 Disorder of urea cycle metabolism, unspecified: Secondary | ICD-10-CM | POA: Diagnosis present

## 2021-08-01 DIAGNOSIS — R6521 Severe sepsis with septic shock: Secondary | ICD-10-CM | POA: Diagnosis present

## 2021-08-01 DIAGNOSIS — J168 Pneumonia due to other specified infectious organisms: Secondary | ICD-10-CM | POA: Diagnosis not present

## 2021-08-01 DIAGNOSIS — R1310 Dysphagia, unspecified: Secondary | ICD-10-CM | POA: Diagnosis not present

## 2021-08-01 DIAGNOSIS — N1832 Chronic kidney disease, stage 3b: Secondary | ICD-10-CM | POA: Diagnosis present

## 2021-08-01 DIAGNOSIS — R6520 Severe sepsis without septic shock: Secondary | ICD-10-CM | POA: Diagnosis not present

## 2021-08-01 DIAGNOSIS — I679 Cerebrovascular disease, unspecified: Secondary | ICD-10-CM | POA: Diagnosis not present

## 2021-08-01 DIAGNOSIS — E871 Hypo-osmolality and hyponatremia: Secondary | ICD-10-CM | POA: Diagnosis not present

## 2021-08-01 DIAGNOSIS — M47814 Spondylosis without myelopathy or radiculopathy, thoracic region: Secondary | ICD-10-CM | POA: Diagnosis not present

## 2021-08-01 DIAGNOSIS — J9601 Acute respiratory failure with hypoxia: Secondary | ICD-10-CM | POA: Diagnosis not present

## 2021-08-01 DIAGNOSIS — I63532 Cerebral infarction due to unspecified occlusion or stenosis of left posterior cerebral artery: Secondary | ICD-10-CM | POA: Diagnosis present

## 2021-08-01 DIAGNOSIS — A414 Sepsis due to anaerobes: Secondary | ICD-10-CM | POA: Diagnosis not present

## 2021-08-01 DIAGNOSIS — G9340 Encephalopathy, unspecified: Secondary | ICD-10-CM | POA: Diagnosis not present

## 2021-08-01 DIAGNOSIS — Z452 Encounter for adjustment and management of vascular access device: Secondary | ICD-10-CM | POA: Diagnosis not present

## 2021-08-01 DIAGNOSIS — R7989 Other specified abnormal findings of blood chemistry: Secondary | ICD-10-CM | POA: Diagnosis not present

## 2021-08-01 DIAGNOSIS — G928 Other toxic encephalopathy: Secondary | ICD-10-CM | POA: Diagnosis not present

## 2021-08-01 DIAGNOSIS — N390 Urinary tract infection, site not specified: Secondary | ICD-10-CM | POA: Diagnosis not present

## 2021-08-01 DIAGNOSIS — N170 Acute kidney failure with tubular necrosis: Secondary | ICD-10-CM | POA: Diagnosis present

## 2021-08-01 DIAGNOSIS — D631 Anemia in chronic kidney disease: Secondary | ICD-10-CM | POA: Diagnosis present

## 2021-08-01 DIAGNOSIS — N183 Chronic kidney disease, stage 3 unspecified: Secondary | ICD-10-CM

## 2021-08-01 DIAGNOSIS — Z4682 Encounter for fitting and adjustment of non-vascular catheter: Secondary | ICD-10-CM | POA: Diagnosis not present

## 2021-08-01 DIAGNOSIS — A419 Sepsis, unspecified organism: Secondary | ICD-10-CM | POA: Diagnosis not present

## 2021-08-01 DIAGNOSIS — F32A Depression, unspecified: Secondary | ICD-10-CM | POA: Diagnosis not present

## 2021-08-01 DIAGNOSIS — I959 Hypotension, unspecified: Secondary | ICD-10-CM | POA: Diagnosis not present

## 2021-08-01 DIAGNOSIS — Z20822 Contact with and (suspected) exposure to covid-19: Secondary | ICD-10-CM | POA: Diagnosis present

## 2021-08-01 DIAGNOSIS — B961 Klebsiella pneumoniae [K. pneumoniae] as the cause of diseases classified elsewhere: Secondary | ICD-10-CM | POA: Diagnosis not present

## 2021-08-01 DIAGNOSIS — Z993 Dependence on wheelchair: Secondary | ICD-10-CM | POA: Diagnosis not present

## 2021-08-01 DIAGNOSIS — N179 Acute kidney failure, unspecified: Secondary | ICD-10-CM | POA: Diagnosis not present

## 2021-08-01 DIAGNOSIS — Z66 Do not resuscitate: Secondary | ICD-10-CM | POA: Diagnosis not present

## 2021-08-01 DIAGNOSIS — J449 Chronic obstructive pulmonary disease, unspecified: Secondary | ICD-10-CM | POA: Diagnosis not present

## 2021-08-01 DIAGNOSIS — A4159 Other Gram-negative sepsis: Secondary | ICD-10-CM | POA: Diagnosis present

## 2021-08-01 DIAGNOSIS — G061 Intraspinal abscess and granuloma: Secondary | ICD-10-CM | POA: Diagnosis present

## 2021-08-01 DIAGNOSIS — F0153 Vascular dementia, unspecified severity, with mood disturbance: Secondary | ICD-10-CM | POA: Diagnosis present

## 2021-08-01 DIAGNOSIS — M4620 Osteomyelitis of vertebra, site unspecified: Secondary | ICD-10-CM | POA: Diagnosis not present

## 2021-08-01 DIAGNOSIS — E440 Moderate protein-calorie malnutrition: Secondary | ICD-10-CM | POA: Diagnosis not present

## 2021-08-01 DIAGNOSIS — I6389 Other cerebral infarction: Secondary | ICD-10-CM | POA: Diagnosis not present

## 2021-08-01 DIAGNOSIS — E43 Unspecified severe protein-calorie malnutrition: Secondary | ICD-10-CM | POA: Diagnosis present

## 2021-08-01 DIAGNOSIS — L89153 Pressure ulcer of sacral region, stage 3: Secondary | ICD-10-CM | POA: Diagnosis present

## 2021-08-01 LAB — CMP (CANCER CENTER ONLY)
ALT: 12 U/L (ref 0–44)
AST: 34 U/L (ref 15–41)
Albumin: 2.7 g/dL — ABNORMAL LOW (ref 3.5–5.0)
Alkaline Phosphatase: 102 U/L (ref 38–126)
Anion gap: 8 (ref 5–15)
BUN: 37 mg/dL — ABNORMAL HIGH (ref 8–23)
CO2: 25 mmol/L (ref 22–32)
Calcium: 9.7 mg/dL (ref 8.9–10.3)
Chloride: 100 mmol/L (ref 98–111)
Creatinine: 1.59 mg/dL — ABNORMAL HIGH (ref 0.44–1.00)
GFR, Estimated: 33 mL/min — ABNORMAL LOW (ref >60)
Glucose, Bld: 97 mg/dL (ref 70–99)
Potassium: 4 mmol/L (ref 3.5–5.1)
Sodium: 133 mmol/L — ABNORMAL LOW (ref 135–145)
Total Bilirubin: 0.8 mg/dL (ref 0.3–1.2)
Total Protein: 7.6 g/dL (ref 6.5–8.1)

## 2021-08-01 LAB — CBC WITH DIFFERENTIAL (CANCER CENTER ONLY)
Abs Immature Granulocytes: 0.09 10*3/uL — ABNORMAL HIGH (ref 0.00–0.07)
Basophils Absolute: 0 10*3/uL (ref 0.0–0.1)
Basophils Relative: 0 %
Eosinophils Absolute: 0 10*3/uL (ref 0.0–0.5)
Eosinophils Relative: 0 %
HCT: 22.4 % — ABNORMAL LOW (ref 36.0–46.0)
Hemoglobin: 7.5 g/dL — ABNORMAL LOW (ref 12.0–15.0)
Immature Granulocytes: 1 %
Lymphocytes Relative: 6 %
Lymphs Abs: 0.8 10*3/uL (ref 0.7–4.0)
MCH: 30 pg (ref 26.0–34.0)
MCHC: 33.5 g/dL (ref 30.0–36.0)
MCV: 89.6 fL (ref 80.0–100.0)
Monocytes Absolute: 0.8 10*3/uL (ref 0.1–1.0)
Monocytes Relative: 6 %
Neutro Abs: 11.4 10*3/uL — ABNORMAL HIGH (ref 1.7–7.7)
Neutrophils Relative %: 87 %
Platelet Count: 605 10*3/uL — ABNORMAL HIGH (ref 150–400)
RBC: 2.5 MIL/uL — ABNORMAL LOW (ref 3.87–5.11)
RDW: 16.9 % — ABNORMAL HIGH (ref 11.5–15.5)
WBC Count: 13.1 10*3/uL — ABNORMAL HIGH (ref 4.0–10.5)
nRBC: 0 % (ref 0.0–0.2)

## 2021-08-01 MED ORDER — EPOETIN ALFA-EPBX 40000 UNIT/ML IJ SOLN
40000.0000 [IU] | Freq: Once | INTRAMUSCULAR | Status: AC
  Administered 2021-08-01: 40000 [IU] via SUBCUTANEOUS
  Filled 2021-08-01: qty 1

## 2021-08-01 MED ORDER — ESCITALOPRAM OXALATE 5 MG PO TABS: 5.0000 mg | ORAL_TABLET | Freq: Every day | ORAL | 3 refills | Status: AC

## 2021-08-01 NOTE — Patient Instructions (Signed)
Epoetin Alfa injection ?What is this medication? ?EPOETIN ALFA (e POE e tin AL fa) helps your body make more red blood cells. This medicine is used to treat anemia caused by chronic kidney disease, cancer chemotherapy, or HIV-therapy. It may also be used before surgery if you have anemia. ?This medicine may be used for other purposes; ask your health care provider or pharmacist if you have questions. ?COMMON BRAND NAME(S): Epogen, Procrit, Retacrit ?What should I tell my care team before I take this medication? ?They need to know if you have any of these conditions: ?cancer ?heart disease ?high blood pressure ?history of blood clots ?history of stroke ?low levels of folate, iron, or vitamin B12 in the blood ?seizures ?an unusual or allergic reaction to erythropoietin, albumin, benzyl alcohol, hamster proteins, other medicines, foods, dyes, or preservatives ?pregnant or trying to get pregnant ?breast-feeding ?How should I use this medication? ?This medicine is for injection into a vein or under the skin. It is usually given by a health care professional in a hospital or clinic setting. ?If you get this medicine at home, you will be taught how to prepare and give this medicine. Use exactly as directed. Take your medicine at regular intervals. Do not take your medicine more often than directed. ?It is important that you put your used needles and syringes in a special sharps container. Do not put them in a trash can. If you do not have a sharps container, call your pharmacist or healthcare provider to get one. ?A special MedGuide will be given to you by the pharmacist with each prescription and refill. Be sure to read this information carefully each time. ?Talk to your pediatrician regarding the use of this medicine in children. While this drug may be prescribed for selected conditions, precautions do apply. ?Overdosage: If you think you have taken too much of this medicine contact a poison control center or emergency  room at once. ?NOTE: This medicine is only for you. Do not share this medicine with others. ?What if I miss a dose? ?If you miss a dose, take it as soon as you can. If it is almost time for your next dose, take only that dose. Do not take double or extra doses. ?What may interact with this medication? ?Interactions have not been studied. ?This list may not describe all possible interactions. Give your health care provider a list of all the medicines, herbs, non-prescription drugs, or dietary supplements you use. Also tell them if you smoke, drink alcohol, or use illegal drugs. Some items may interact with your medicine. ?What should I watch for while using this medication? ?Your condition will be monitored carefully while you are receiving this medicine. ?You may need blood work done while you are taking this medicine. ?This medicine may cause a decrease in vitamin B6. You should make sure that you get enough vitamin B6 while you are taking this medicine. Discuss the foods you eat and the vitamins you take with your health care professional. ?What side effects may I notice from receiving this medication? ?Side effects that you should report to your doctor or health care professional as soon as possible: ?allergic reactions like skin rash, itching or hives, swelling of the face, lips, or tongue ?seizures ?signs and symptoms of a blood clot such as breathing problems; changes in vision; chest pain; severe, sudden headache; pain, swelling, warmth in the leg; trouble speaking; sudden numbness or weakness of the face, arm or leg ?signs and symptoms of a stroke like   changes in vision; confusion; trouble speaking or understanding; severe headaches; sudden numbness or weakness of the face, arm or leg; trouble walking; dizziness; loss of balance or coordination ?Side effects that usually do not require medical attention (report to your doctor or health care professional if they continue or are  bothersome): ?chills ?cough ?dizziness ?fever ?headaches ?joint pain ?muscle cramps ?muscle pain ?nausea, vomiting ?pain, redness, or irritation at site where injected ?This list may not describe all possible side effects. Call your doctor for medical advice about side effects. You may report side effects to FDA at 1-800-FDA-1088. ?Where should I keep my medication? ?Keep out of the reach of children. ?Store in a refrigerator between 2 and 8 degrees C (36 and 46 degrees F). Do not freeze or shake. Throw away any unused portion if using a single-dose vial. Multi-dose vials can be kept in the refrigerator for up to 21 days after the initial dose. Throw away unused medicine. ?NOTE: This sheet is a summary. It may not cover all possible information. If you have questions about this medicine, talk to your doctor, pharmacist, or health care provider. ?? 2023 Elsevier/Gold Standard (2016-09-17 00:00:00) ? ?

## 2021-08-01 NOTE — Patient Instructions (Signed)
It was a pleasure to see you today at our office.   Recommendations:  Follow up in 6  months Continue Memantine 10 mg twice daily Start Lexapro for mood at 5 mg daily, may increase after 3-4 weeks to 10 mg if needed . If not working,  Please have your primary care physician consider other medications   Whom to call:  Memory  decline, memory medications: Call our office [phone]     Counseling regarding caregiver distress, including caregiver depression, anxiety and issues regarding community resources, adult day care programs, adult living facilities, or memory care questions:   Feel free to contact [place], Social Worker at [phone]   For assessment of decision of mental capacity and competency:  Call Dr. Anthoney Harada, geriatric psychiatrist at [phone]  For guidance in geriatric dementia issues please call [facility] [phone]  For guidance regarding [facility] and if placement were needed at the facility, contact Arnell Asal, Social Worker tel: [phone]  If you have any severe symptoms of a stroke, or other severe issues such as confusion,severe chills or fever, etc call 911 or go to the ER as you may need to be evaluated further   Feel free to visit Facebook page " [email or web address]" for tips of how to care for people with memory problems.       RECOMMENDATIONS FOR ALL PATIENTS WITH MEMORY PROBLEMS: 1. Continue to exercise (Recommend 30 minutes of walking everyday, or 3 hours every week) 2. Increase social interactions - continue going to [place] and enjoy social gatherings with friends and family 3. Eat healthy, avoid fried foods and eat more fruits and vegetables 4. Maintain adequate blood pressure, blood sugar, and blood cholesterol level. Reducing the risk of stroke and cardiovascular disease also helps promoting better memory. 5. Avoid stressful situations. Live a simple life and avoid aggravations. Organize your  time and prepare for the next day in anticipation. 6. Sleep well, avoid any interruptions of sleep and avoid any distractions in the bedroom that may interfere with adequate sleep quality 7. Avoid sugar, avoid sweets as there is a strong link between excessive sugar intake, diabetes, and cognitive impairment We discussed the Mediterranean diet, which has been shown to help patients reduce the risk of progressive memory disorders and reduces cardiovascular risk. This includes eating fish, eat fruits and green leafy vegetables, nuts like almonds and hazelnuts, walnuts, and also use olive oil. Avoid fast foods and fried foods as much as possible. Avoid sweets and sugar as sugar use has been linked to worsening of memory function.  There is always a concern of gradual progression of memory problems. If this is the case, then we may need to adjust level of care according to patient needs. Support, both to the patient and caregiver, should then be put into place.    The [employer] Association is here all day, every day for people facing Alzheimer's disease through our free 24/7 Helpline: [phone]. The Helpline provides reliable information and support to all those who need assistance, such as individuals living with memory loss, Alzheimer's or other dementia, caregivers, health care professionals and the public.  Our highly trained and knowledgeable staff can help you with: Understanding memory loss, dementia and Alzheimer's  Medications and other treatment options  General information about aging and brain health  Skills to provide quality care and to find the best care from professionals  Legal, financial and living-arrangement decisions Our Helpline also features: Confidential care consultation provided by  master's level clinicians who can help with decision-making support, crisis assistance and education on issues families face every day  Help in a caller's preferred language using our  translation service that features more than 200 languages and dialects  Referrals to local community programs, services and ongoing support     FALL PRECAUTIONS: Be cautious when walking. Scan the area for obstacles that may increase the risk of trips and falls. When getting up in the mornings, sit up at the edge of the bed for a few minutes before getting out of bed. Consider elevating the bed at the head end to avoid drop of blood pressure when getting up. Walk always in a well-lit room (use night lights in the walls). Avoid area rugs or power cords from appliances in the middle of the walkways. Use a walker or a cane if necessary and consider physical therapy for balance exercise. Get your eyesight checked regularly.  FINANCIAL OVERSIGHT: Supervision, especially oversight when making financial decisions or transactions is also recommended.  HOME SAFETY: Consider the safety of the kitchen when operating appliances like stoves, microwave oven, and blender. Consider having supervision and share cooking responsibilities until no longer able to participate in those. Accidents with firearms and other hazards in the house should be identified and addressed as well.   ABILITY TO BE LEFT ALONE: If patient is unable to contact 911 operator, consider using LifeLine, or when the need is there, arrange for someone to stay with patients. Smoking is a fire hazard, consider supervision or cessation. Risk of wandering should be assessed by caregiver and if detected at any point, supervision and safe proof recommendations should be instituted.  MEDICATION SUPERVISION: Inability to self-administer medication needs to be constantly addressed. Implement a mechanism to ensure safe administration of the medications.   DRIVING: Regarding driving, in patients with progressive memory problems, driving will be impaired. We advise to have someone else do the driving if trouble finding directions or if minor accidents are  reported. Independent driving assessment is available to determine safety of driving.   If you are interested in the driving assessment, you can contact the following:  [employer] in [place]  [employer] [place] [phone] or [phone]      [place] refers to food and lifestyle choices that are based on the traditions of countries located on the [employer]. This way of eating has been shown to help prevent certain conditions and improve outcomes for people who have chronic diseases, like kidney disease and heart disease. What are tips for following this plan? Lifestyle  Cook and eat meals together with your family, when possible. Drink enough fluid to keep your urine clear or pale yellow. Be physically active every day. This includes: Aerobic exercise like running or swimming. Leisure activities like gardening, walking, or housework. Get 7-8 hours of sleep each night. If recommended by your health care provider, drink red wine in moderation. This means 1 glass a day for nonpregnant women and 2 glasses a day for men. A glass of wine equals 5 oz (150 mL). Reading food labels  Check the serving size of packaged foods. For foods such as rice and pasta, the serving size refers to the amount of cooked product, not dry. Check the total fat in packaged foods. Avoid foods that have saturated fat or trans fats. Check the ingredients list for added sugars, such as corn syrup. Shopping  At the grocery store, buy most of your food from the areas near the walls of the store. This includes: Fresh fruits and vegetables (produce). Grains, beans, nuts, and seeds. Some of these may be available in unpackaged forms or large amounts (in bulk). Fresh seafood. Poultry and eggs. Low-fat dairy products. Buy whole ingredients instead of prepackaged  foods. Buy fresh fruits and vegetables in-season from local farmers markets. Buy frozen fruits and vegetables in resealable bags. If you do not have access to quality fresh seafood, buy precooked frozen shrimp or canned fish, such as tuna, salmon, or sardines. Buy small amounts of raw or cooked vegetables, salads, or olives from the deli or salad bar at your store. Stock your pantry so you always have certain foods on hand, such as olive oil, canned tuna, canned tomatoes, rice, pasta, and beans. Cooking  Cook foods with extra-virgin olive oil instead of using butter or other vegetable oils. Have meat as a side dish, and have vegetables or grains as your main dish. This means having meat in small portions or adding small amounts of meat to foods like pasta or stew. Use beans or vegetables instead of meat in common dishes like chili or lasagna. Experiment with different cooking methods. Try roasting or broiling vegetables instead of steaming or sauteing them. Add frozen vegetables to soups, stews, pasta, or rice. Add nuts or seeds for added healthy fat at each meal. You can add these to yogurt, salads, or vegetable dishes. Marinate fish or vegetables using olive oil, lemon juice, garlic, and fresh herbs. Meal planning  Plan to eat 1 vegetarian meal one day each week. Try to work up to 2 vegetarian meals, if possible. Eat seafood 2 or more times a week. Have healthy snacks readily available, such as: Vegetable sticks with hummus. Greek yogurt. Fruit and nut trail mix. Eat balanced meals throughout the week. This includes: Fruit: 2-3 servings a day Vegetables: 4-5 servings a day Low-fat dairy: 2 servings a day Fish, poultry, or lean meat: 1 serving a day Beans and legumes: 2 or more servings a week Nuts and seeds: 1-2 servings a day Whole grains: 6-8 servings a day Extra-virgin olive oil: 3-4 servings a day Limit red meat and sweets to only a few servings a month What are my food  choices? Mediterranean diet Recommended Grains: Whole-grain pasta. Brown rice. Bulgar wheat. Polenta. Couscous. Whole-wheat bread. [place]. Vegetables: Artichokes. Beets. Broccoli. Cabbage. Carrots. Eggplant. Green beans. Chard. Kale. Spinach. Onions. Leeks. Peas. Squash. Tomatoes. Peppers. Radishes. Fruits: Apples. Apricots. Avocado. Berries. Bananas. Cherries. Dates. Figs. Grapes. Lemons. Melon. Oranges. Peaches. Plums. Pomegranate. Meats and other protein foods: Beans. Almonds. Sunflower seeds. Pine nuts. Peanuts. [place]. Salmon. Scallops. Shrimp. [place]. Tilapia. Clams. Oysters. Eggs. Dairy: Low-fat milk. Cheese. Greek yogurt. Beverages: Water. Red wine. Herbal tea. Fats and oils: Extra virgin olive oil. Avocado oil. Grape seed oil. Sweets and desserts: [place] yogurt with honey. Baked apples. Poached pears. Trail mix. Seasoning and other foods: Basil. Cilantro. Coriander. Cumin. Mint. Parsley. Sage. Rosemary. Tarragon. Garlic. Oregano. Thyme. Pepper. Balsalmic vinegar. Tahini. Hummus. Tomato sauce. Olives. Mushrooms. Limit these Grains: Prepackaged pasta or rice dishes. Prepackaged cereal with added sugar. Vegetables: Deep fried potatoes (french fries). Fruits: Fruit canned in syrup. Meats and other protein foods: Beef. Pork. Lamb. Poultry with skin. Hot dogs. [place] Salines. Dairy: Ice cream. Sour cream. Whole milk. Beverages: Juice. Sugar-sweetened soft drinks. Beer. Liquor and spirits. Fats and oils: Butter. Canola oil. Vegetable oil. Beef fat (tallow). Lard. Sweets and desserts: Cookies.  Cakes. Pies. Candy. Seasoning and other foods: Mayonnaise. Premade sauces and marinades. The items listed may not be a complete list. Talk with your dietitian about what dietary choices are right for you. Summary The Mediterranean diet includes both food and lifestyle choices. Eat a variety of fresh fruits and vegetables, beans, nuts, seeds, and whole grains. Limit the amount of red meat and sweets that  you eat. Talk with your health care provider about whether it is safe for you to drink red wine in moderation. This means 1 glass a day for nonpregnant women and 2 glasses a day for men. A glass of wine equals 5 oz (150 mL). This information is not intended to replace advice given to you by your health care provider. Make sure you discuss any questions you have with your health care provider. Document Released: 09/07/2015 Document Revised: 10/10/2015 Document Reviewed: 09/07/2015 Elsevier Interactive Patient Education  2017 [employer].

## 2021-08-01 NOTE — Progress Notes (Signed)
Assessment/Plan:   Dementia likely due to vascular disease, moderate to severe   Courtney Goodman is a very pleasant 76 y.o. RH female with a history of hypertension, hyperlipidemia, diabetes, seen today in follow up for memory loss.  Neuropsychological testing on August 2021 was unable to be done as the patient was slow to respond.  Patient is currently on memantine 10 mg twice daily, tolerating well.  Last MMSE on 02/01/2021 was 8/30, noting worsening of her memory.  Since her last visit, the patient had sepsis due to pancreatitis while on Victoza.  Since discharge from the hospital, her cognitive status has declined, and there is notable progression of the disease.  She is wheelchair-bound by now.   Continue memantine 10 mg twice daily  side effects were discussed Continue to monitor mood by PCP Control cardiovascular risk factors Change diet to liquid diet, to prevent aspiration.  If needed, we will proceed with SLP. Consider palliative care involvement. Follow up in 6 months.   Case discussed with Dr. Delice Lesch who agrees with the plan       Subjective:    This patient is accompanied in the office by her daughter who supplements the history.  Patient is unable to elaborate due advanced dementia.  Previous records as well as any outside records available were reviewed prior to todays visit. Patient was last seen at our office on 02/01/2021 at which time her MMSE was 8/30.   Any changes in memory since last visit?  "She is going down "-daughter says.  She has some days in which she does not know her daughter's name, or confuses it.  Main issue is that she is crying "for no reason ".  She is wheelchair-bound, and is less interactive than prior. Patient lives with: daughter repeats oneself?  Endorsed Disoriented when walking into a room?  Patient denies   Leaving objects in unusual places?  Patient denies   Ambulates  with difficulty?  She is wheelchair-bound.  She likes to lay down most  of the day, because she is scared of falling.   Any head injuries?  Patient denies   History of seizures?   Patient denies   Wandering behavior?  Patient denies   Patient drives?   Patient no longer drives  Any mood changes such irritability agitation?  She cries a lot  Any history of depression?:  Patient denies   Hallucinations?  Patient denies   Paranoia?  Patient denies   Patient reports that he sleeps well without vivid dreams, REM behavior or sleepwalking   History of sleep apnea?  Patient denies   Any hygiene concerns?  Patient denies   Independent of bathing and dressing?  Endorsed  Does the patient needs help with medications?  Denies Who is in charge of the finances?   is in charge    Any changes in appetite?  It  takes and hour to eat because she forgets to swallow  Patient have trouble swallowing? Not at this moment, but she cannot keep her food in her mouth for a long time. Does the patient cook?  Patient denies   Any kitchen accidents such as leaving the stove on? Patient denies   Any headaches?  Patient denies   Double vision? Patient denies   Any focal numbness or tingling?  Patient denies   Chronic back pain Patient denies   Unilateral weakness?  Patient denies   Any tremors?  Patient denies   Any history of anosmia?  Patient  denies   Any incontinence of urine?  Endorsed  Any bowel dysfunction?   Constipated        History on Initial Assessment 08/03/2019: This is a 76 year old right-handed woman with a history of hypertension, hyperlipidemia, diabetes, presenting for evaluation of cognitive impairment. She comes to the office this morning stating she woke up very confused, she is wearing mis-matched shoes, wearing a shoe on the right foot and a sandal on the left foot. She states she has not been feeling well this past week, her sugars have been acting up and insulin dose was adjusted a week ago. She was given ginger ale on arrival since she was unsteady, glucose reading  after ginger ale was 81, which she states is still low for her. She states she is very confused and cannot remember things when her glucose levels are low. She feels good in the afternoons when her glucose levels are in the 150s. She states she does not eat enough when she goes to sleep at night, so her glucose levels drop and she wakes up hypoglycemic and confused. One time she was very confused, glucose level was in the 30s, she was brought to the ER and level was up by then. She states she has lost her appetite, she does not forget to eat but feels sick so she does not eat. She does not think she forgets her medications, she denies any missed bill payments. I spoke to her daughter [name] on the phone to obtain additional information. [name] reports that memory changes only started 6-7 months ago when her insulin dose was changed. [name] was not noticing any memory concerns prior to this, she notes when the insulin dose was increased or decreased, her mother would get discombobulated, some mornings she wakes up not knowing what is going on and her glucose level would be either low or high. She lives alone, [name] agrees that she is compliant with her medications and knows how she should take her insulin if her level is low. Her other daughter comes daily to check on her, [name] comes once a week and calls daily. [name] states she is fine with cooking, when her glucose level is up/down, she may forget and left the stove on one time. She reports going to the grocery and having to ask her daughter for her PIN number because she could not remember. [name] states she has not been driving for 1.5 years, [name] denies getting lost, however the patient reports that she stopped driving because she would have spells where she ends up somewhere and would not remember her she got there, usually when her glucose levels were off. [name] reports bathing has gotten a little sketchy because she if afraid of falling in the shower. I discussed the  patients state of dress today (wearing mismatched shoes), [name] states this is unusual, she normally dresses fine but has a hard time with early morning appointments because she has not yet eaten so her glucose levels are low.She has a bedside commode and wears adult pads for urinary incontinence. [name] feels she needs assistance with showering/dressing, bu the patient has been turning down assistance.    She reports frontal headaches 1-2 times a week, she thinks this is stress-related, she cannot remember things. She denies any diplopia, dysarthria/dysphagia, neck/back pain, focal numbness/tingling, bowel dysfunction, anosmia, or tremors. Sleep is okay with Trazodone. She feels off balance, she has a walker/cane at home but does not use it. She reports falling out of  bed this week, she was asleep and turned and fell off, waking up on the floor. [name] denies any personality changes, "she is a Retail buyer," but no hallucinations or paranoia. No family history of dementia, no history of significant head injuries or alcohol use.    Her last brain imaging on [facility] was in 2012, with no acute changes seen. There was note of a small punctate linear shaped area of low-attenuation that projects within the medial of caudate nucleus on the right, a second more vague area projects just lateral to this, likely representing sequela or chronic and subacute areas of lacunar infarct. There was a small punctate area of calcification along the periphery of the left occipital region, likely a small calcified meningioma  PREVIOUS MEDICATIONS:   CURRENT MEDICATIONS:  Outpatient Encounter Medications as of 08/01/2021  Medication Sig   albuterol (VENTOLIN HFA) 108 (90 Base) MCG/ACT inhaler Inhale 1-2 puffs into the lungs every 6 (six) hours as needed for wheezing or shortness of breath.   Alcohol Swabs (ALCOHOL PREP) PADS Use for testing (Patient taking differently: 1 each by Other route 2 (two) times daily. E11.9)   aspirin 81 MG  EC tablet Take 1 tablet (81 mg total) by mouth daily.   Biotin 10000 MCG TABS Take 10,000 mcg by mouth daily.   carvedilol (COREG) 6.25 MG tablet Take 1 tablet (6.25 mg total) by mouth 2 (two) times daily with a meal.   Continuous Blood Gluc Receiver (FREESTYLE LIBRE 2 READER) DEVI 1 each by Does not apply route daily.   Continuous Blood Gluc Sensor (FREESTYLE LIBRE 2 SENSOR) MISC 1 each by Does not apply route every 14 (fourteen) days.   escitalopram (LEXAPRO) 5 MG tablet Take 1 tablet (5 mg total) by mouth daily.   fenofibrate (TRICOR) 145 MG tablet TAKE ONE TABLET BY MOUTH ONCE DAILY   folic acid (FOLVITE) 1 MG tablet TAKE ONE TABLET BY MOUTH EVERY MORNING (Patient taking differently: Take 1 mg by mouth in the morning.)   Glucagon 3 MG/DOSE POWD Place 3 mg into the nose once as needed for up to 1 dose.   glucose blood (ONETOUCH VERIO) test strip 1 each by Other route 2 (two) times daily. And lancets 2/day   insulin aspart (FIASP FLEXTOUCH) 100 UNIT/ML FlexTouch Pen Inject 5-7 Units into the skin 3 (three) times daily before meals.   insulin degludec (TRESIBA FLEXTOUCH) 100 UNIT/ML FlexTouch Pen Inject 20 Units into the skin daily.   insulin NPH Human (NOVOLIN N) 100 UNIT/ML injection Inject 18-20 Units into the skin daily.   Insulin Pen Needle 32G X 4 MM MISC Use 4x a day   Lancets (ONETOUCH DELICA PLUS [id number]) MISC USE TO test four times A DAY   memantine (NAMENDA) 10 MG tablet Take 1 tablet (10 mg at night) for 2 weeks, then increase to 1 tablet (10 mg) twice a day (Patient taking differently: Take 10 mg by mouth in the morning and at bedtime.)   omeprazole (PRILOSEC) 40 MG capsule TAKE ONE CAPSULE BY MOUTH EVERY MORNING   simvastatin (ZOCOR) 20 MG tablet Take 1 tablet (20 mg total) by mouth at bedtime.   Cholecalciferol 25 MCG (1000 UT) tablet Take 1 tablet by mouth daily.   [EXPIRED] epoetin alfa-epbx (RETACRIT) injection 40,000 Units    No facility-administered encounter medications  on file as of 08/01/2021.       02/01/2021   11:00 AM 11/13/2020    2:00 PM 09/15/2019    2:00 PM  MMSE - Mini Mental State Exam  Orientation to time 0 2 5  Orientation to Place '4 5 4  '$ Registration 0 3 3  Attention/ Calculation 0 0 4  Recall 0 0 1  Language- name 2 objects '2 1 2  '$ Language- repeat 0 1 1  Language- follow 3 step command '2 2 2  '$ Language- read & follow direction 0 1 1  Write a sentence 0 0 1  Copy design 0 0 0  Total score '8 15 24       '$ No data to display          Objective:     PHYSICAL EXAMINATION:    VITALS:   Vitals:   08/01/21 1058  BP: 130/80  Pulse: 74  Resp: 18  SpO2: 98%  Weight: 135 lb (61.2 kg)  Height: '5\' 5"'$  (1.651 m)    GEN:  The patient appears stated age and is in NAD. HEENT:  Normocephalic, atraumatic.   Neurological examination:  General: NAD, well-groomed, appears stated age. Orientation: Her eyes are closed, but she is awake.  She is unable to interact or answer any questions. Cranial nerves: There is good facial symmetry.The speech was unable to be tested.  No apparent aphasia or dysarthria. Fund of knowledge is reduced. Recent and remote memory are impaired. Attention and concentration are reduced.  Unable to name objects and repeat phrases.  Hearing is intact to conversational tone.    Sensation: Sensation is intact to light touch throughout Motor: Strength is at least antigravity x4. Tremors: none  DTR's 2/4 in UE/LE     Movement examination: Tone: There is increased tone in the UE/LE.  There is significant contraction of her hands bilaterally Abnormal movements:  no tremor.  No myoclonus.  No asterixis.   Coordination: Unable to perform REM test or finger-to-nose.   Gait and Station: Unable to test her gait and station, as the patient is wheelchair-bound, and is not following commands. Thank you for allowing [place] the opportunity to participate in the care of this nice patient. Please do not hesitate to contact us for any  questions or concerns.   Total time spent on today's visit was 30 minutes dedicated to this patient today, preparing to see patient, examining the patient, ordering tests and/or medications and counseling the patient, documenting clinical information in the EHR or other health record, independently interpreting results and communicating results to the patient/family, discussing treatment and goals, answering patient's questions and coordinating care.  Cc:  Serita Kyle [facility] 08/01/2021 12:27 PM

## 2021-08-02 ENCOUNTER — Telehealth: Payer: Self-pay | Admitting: Physician Assistant

## 2021-08-02 NOTE — Telephone Encounter (Signed)
.  Called patient to schedule appointment per 7/6 inb, patient is aware of date and time.

## 2021-08-03 ENCOUNTER — Ambulatory Visit (INDEPENDENT_AMBULATORY_CARE_PROVIDER_SITE_OTHER): Payer: Medicare HMO | Admitting: Podiatry

## 2021-08-03 DIAGNOSIS — Z91199 Patient's noncompliance with other medical treatment and regimen due to unspecified reason: Secondary | ICD-10-CM

## 2021-08-04 ENCOUNTER — Emergency Department (HOSPITAL_COMMUNITY): Payer: Medicare HMO

## 2021-08-04 ENCOUNTER — Inpatient Hospital Stay (HOSPITAL_COMMUNITY): Payer: Medicare HMO

## 2021-08-04 ENCOUNTER — Inpatient Hospital Stay (HOSPITAL_COMMUNITY)
Admission: EM | Admit: 2021-08-04 | Discharge: 2021-08-28 | DRG: 870 | Disposition: E | Payer: Medicare HMO | Attending: Internal Medicine | Admitting: Internal Medicine

## 2021-08-04 ENCOUNTER — Other Ambulatory Visit: Payer: Self-pay

## 2021-08-04 DIAGNOSIS — F0390 Unspecified dementia without behavioral disturbance: Secondary | ICD-10-CM | POA: Diagnosis not present

## 2021-08-04 DIAGNOSIS — J9601 Acute respiratory failure with hypoxia: Secondary | ICD-10-CM | POA: Diagnosis present

## 2021-08-04 DIAGNOSIS — R404 Transient alteration of awareness: Secondary | ICD-10-CM | POA: Diagnosis not present

## 2021-08-04 DIAGNOSIS — F0153 Vascular dementia, unspecified severity, with mood disturbance: Secondary | ICD-10-CM | POA: Diagnosis present

## 2021-08-04 DIAGNOSIS — E11649 Type 2 diabetes mellitus with hypoglycemia without coma: Secondary | ICD-10-CM | POA: Diagnosis not present

## 2021-08-04 DIAGNOSIS — Z6822 Body mass index (BMI) 22.0-22.9, adult: Secondary | ICD-10-CM

## 2021-08-04 DIAGNOSIS — R601 Generalized edema: Secondary | ICD-10-CM | POA: Diagnosis not present

## 2021-08-04 DIAGNOSIS — Z87440 Personal history of urinary (tract) infections: Secondary | ICD-10-CM

## 2021-08-04 DIAGNOSIS — I679 Cerebrovascular disease, unspecified: Secondary | ICD-10-CM | POA: Diagnosis not present

## 2021-08-04 DIAGNOSIS — D631 Anemia in chronic kidney disease: Secondary | ICD-10-CM | POA: Diagnosis present

## 2021-08-04 DIAGNOSIS — N390 Urinary tract infection, site not specified: Secondary | ICD-10-CM

## 2021-08-04 DIAGNOSIS — R4182 Altered mental status, unspecified: Secondary | ICD-10-CM | POA: Diagnosis present

## 2021-08-04 DIAGNOSIS — N183 Chronic kidney disease, stage 3 unspecified: Secondary | ICD-10-CM | POA: Diagnosis not present

## 2021-08-04 DIAGNOSIS — Z66 Do not resuscitate: Secondary | ICD-10-CM | POA: Diagnosis not present

## 2021-08-04 DIAGNOSIS — N3946 Mixed incontinence: Secondary | ICD-10-CM | POA: Diagnosis present

## 2021-08-04 DIAGNOSIS — M4620 Osteomyelitis of vertebra, site unspecified: Secondary | ICD-10-CM | POA: Diagnosis not present

## 2021-08-04 DIAGNOSIS — E7220 Disorder of urea cycle metabolism, unspecified: Secondary | ICD-10-CM | POA: Diagnosis present

## 2021-08-04 DIAGNOSIS — D6489 Other specified anemias: Secondary | ICD-10-CM | POA: Diagnosis present

## 2021-08-04 DIAGNOSIS — Z79899 Other long term (current) drug therapy: Secondary | ICD-10-CM

## 2021-08-04 DIAGNOSIS — N170 Acute kidney failure with tubular necrosis: Secondary | ICD-10-CM | POA: Diagnosis present

## 2021-08-04 DIAGNOSIS — Z9911 Dependence on respirator [ventilator] status: Secondary | ICD-10-CM | POA: Diagnosis not present

## 2021-08-04 DIAGNOSIS — R29810 Facial weakness: Secondary | ICD-10-CM | POA: Diagnosis present

## 2021-08-04 DIAGNOSIS — R7881 Bacteremia: Secondary | ICD-10-CM | POA: Diagnosis not present

## 2021-08-04 DIAGNOSIS — A419 Sepsis, unspecified organism: Principal | ICD-10-CM

## 2021-08-04 DIAGNOSIS — I2510 Atherosclerotic heart disease of native coronary artery without angina pectoris: Secondary | ICD-10-CM | POA: Diagnosis present

## 2021-08-04 DIAGNOSIS — Z8679 Personal history of other diseases of the circulatory system: Secondary | ICD-10-CM

## 2021-08-04 DIAGNOSIS — N1832 Chronic kidney disease, stage 3b: Secondary | ICD-10-CM | POA: Diagnosis present

## 2021-08-04 DIAGNOSIS — J440 Chronic obstructive pulmonary disease with acute lower respiratory infection: Secondary | ICD-10-CM | POA: Diagnosis present

## 2021-08-04 DIAGNOSIS — G9341 Metabolic encephalopathy: Secondary | ICD-10-CM | POA: Diagnosis not present

## 2021-08-04 DIAGNOSIS — F411 Generalized anxiety disorder: Secondary | ICD-10-CM | POA: Diagnosis present

## 2021-08-04 DIAGNOSIS — G062 Extradural and subdural abscess, unspecified: Secondary | ICD-10-CM | POA: Diagnosis not present

## 2021-08-04 DIAGNOSIS — Z20822 Contact with and (suspected) exposure to covid-19: Secondary | ICD-10-CM | POA: Diagnosis present

## 2021-08-04 DIAGNOSIS — E8770 Fluid overload, unspecified: Secondary | ICD-10-CM | POA: Diagnosis not present

## 2021-08-04 DIAGNOSIS — Z4682 Encounter for fitting and adjustment of non-vascular catheter: Secondary | ICD-10-CM | POA: Diagnosis not present

## 2021-08-04 DIAGNOSIS — R64 Cachexia: Secondary | ICD-10-CM | POA: Diagnosis present

## 2021-08-04 DIAGNOSIS — Z515 Encounter for palliative care: Secondary | ICD-10-CM

## 2021-08-04 DIAGNOSIS — E876 Hypokalemia: Secondary | ICD-10-CM | POA: Diagnosis not present

## 2021-08-04 DIAGNOSIS — R1310 Dysphagia, unspecified: Secondary | ICD-10-CM | POA: Diagnosis present

## 2021-08-04 DIAGNOSIS — J9690 Respiratory failure, unspecified, unspecified whether with hypoxia or hypercapnia: Secondary | ICD-10-CM | POA: Diagnosis not present

## 2021-08-04 DIAGNOSIS — R7989 Other specified abnormal findings of blood chemistry: Secondary | ICD-10-CM | POA: Diagnosis not present

## 2021-08-04 DIAGNOSIS — E871 Hypo-osmolality and hyponatremia: Secondary | ICD-10-CM | POA: Diagnosis not present

## 2021-08-04 DIAGNOSIS — G9340 Encephalopathy, unspecified: Secondary | ICD-10-CM | POA: Diagnosis not present

## 2021-08-04 DIAGNOSIS — A4159 Other Gram-negative sepsis: Secondary | ICD-10-CM | POA: Diagnosis present

## 2021-08-04 DIAGNOSIS — E440 Moderate protein-calorie malnutrition: Secondary | ICD-10-CM | POA: Insufficient documentation

## 2021-08-04 DIAGNOSIS — I69354 Hemiplegia and hemiparesis following cerebral infarction affecting left non-dominant side: Secondary | ICD-10-CM

## 2021-08-04 DIAGNOSIS — E782 Mixed hyperlipidemia: Secondary | ICD-10-CM | POA: Diagnosis present

## 2021-08-04 DIAGNOSIS — D6959 Other secondary thrombocytopenia: Secondary | ICD-10-CM | POA: Diagnosis present

## 2021-08-04 DIAGNOSIS — R0689 Other abnormalities of breathing: Secondary | ICD-10-CM | POA: Diagnosis not present

## 2021-08-04 DIAGNOSIS — E1165 Type 2 diabetes mellitus with hyperglycemia: Secondary | ICD-10-CM | POA: Diagnosis not present

## 2021-08-04 DIAGNOSIS — M4625 Osteomyelitis of vertebra, thoracolumbar region: Secondary | ICD-10-CM | POA: Diagnosis present

## 2021-08-04 DIAGNOSIS — R6521 Severe sepsis with septic shock: Secondary | ICD-10-CM | POA: Diagnosis present

## 2021-08-04 DIAGNOSIS — I10 Essential (primary) hypertension: Secondary | ICD-10-CM | POA: Diagnosis not present

## 2021-08-04 DIAGNOSIS — I959 Hypotension, unspecified: Secondary | ICD-10-CM | POA: Diagnosis not present

## 2021-08-04 DIAGNOSIS — G061 Intraspinal abscess and granuloma: Secondary | ICD-10-CM | POA: Diagnosis present

## 2021-08-04 DIAGNOSIS — A414 Sepsis due to anaerobes: Secondary | ICD-10-CM

## 2021-08-04 DIAGNOSIS — G928 Other toxic encephalopathy: Secondary | ICD-10-CM | POA: Diagnosis present

## 2021-08-04 DIAGNOSIS — Z7982 Long term (current) use of aspirin: Secondary | ICD-10-CM

## 2021-08-04 DIAGNOSIS — M19012 Primary osteoarthritis, left shoulder: Secondary | ICD-10-CM | POA: Diagnosis not present

## 2021-08-04 DIAGNOSIS — Z91199 Patient's noncompliance with other medical treatment and regimen due to unspecified reason: Secondary | ICD-10-CM

## 2021-08-04 DIAGNOSIS — R627 Adult failure to thrive: Secondary | ICD-10-CM | POA: Diagnosis not present

## 2021-08-04 DIAGNOSIS — R569 Unspecified convulsions: Secondary | ICD-10-CM | POA: Diagnosis not present

## 2021-08-04 DIAGNOSIS — R401 Stupor: Secondary | ICD-10-CM

## 2021-08-04 DIAGNOSIS — E875 Hyperkalemia: Secondary | ICD-10-CM | POA: Diagnosis not present

## 2021-08-04 DIAGNOSIS — N179 Acute kidney failure, unspecified: Secondary | ICD-10-CM | POA: Diagnosis not present

## 2021-08-04 DIAGNOSIS — B961 Klebsiella pneumoniae [K. pneumoniae] as the cause of diseases classified elsewhere: Secondary | ICD-10-CM | POA: Diagnosis not present

## 2021-08-04 DIAGNOSIS — J189 Pneumonia, unspecified organism: Secondary | ICD-10-CM | POA: Diagnosis present

## 2021-08-04 DIAGNOSIS — Z993 Dependence on wheelchair: Secondary | ICD-10-CM

## 2021-08-04 DIAGNOSIS — E1122 Type 2 diabetes mellitus with diabetic chronic kidney disease: Secondary | ICD-10-CM | POA: Diagnosis present

## 2021-08-04 DIAGNOSIS — E872 Acidosis, unspecified: Secondary | ICD-10-CM | POA: Diagnosis present

## 2021-08-04 DIAGNOSIS — I63532 Cerebral infarction due to unspecified occlusion or stenosis of left posterior cerebral artery: Secondary | ICD-10-CM | POA: Diagnosis present

## 2021-08-04 DIAGNOSIS — F0150 Vascular dementia without behavioral disturbance: Secondary | ICD-10-CM | POA: Diagnosis present

## 2021-08-04 DIAGNOSIS — Z794 Long term (current) use of insulin: Secondary | ICD-10-CM

## 2021-08-04 DIAGNOSIS — R6520 Severe sepsis without septic shock: Secondary | ICD-10-CM | POA: Diagnosis not present

## 2021-08-04 DIAGNOSIS — K5641 Fecal impaction: Secondary | ICD-10-CM | POA: Diagnosis present

## 2021-08-04 DIAGNOSIS — E8809 Other disorders of plasma-protein metabolism, not elsewhere classified: Secondary | ICD-10-CM | POA: Diagnosis present

## 2021-08-04 DIAGNOSIS — J449 Chronic obstructive pulmonary disease, unspecified: Secondary | ICD-10-CM | POA: Diagnosis not present

## 2021-08-04 DIAGNOSIS — Z87891 Personal history of nicotine dependence: Secondary | ICD-10-CM

## 2021-08-04 DIAGNOSIS — E1169 Type 2 diabetes mellitus with other specified complication: Secondary | ICD-10-CM | POA: Diagnosis present

## 2021-08-04 DIAGNOSIS — Z8619 Personal history of other infectious and parasitic diseases: Secondary | ICD-10-CM

## 2021-08-04 DIAGNOSIS — Z833 Family history of diabetes mellitus: Secondary | ICD-10-CM

## 2021-08-04 DIAGNOSIS — M4645 Discitis, unspecified, thoracolumbar region: Secondary | ICD-10-CM | POA: Diagnosis present

## 2021-08-04 DIAGNOSIS — L8990 Pressure ulcer of unspecified site, unspecified stage: Secondary | ICD-10-CM | POA: Insufficient documentation

## 2021-08-04 DIAGNOSIS — R579 Shock, unspecified: Secondary | ICD-10-CM | POA: Diagnosis not present

## 2021-08-04 DIAGNOSIS — R402432 Glasgow coma scale score 3-8, at arrival to emergency department: Secondary | ICD-10-CM

## 2021-08-04 DIAGNOSIS — D638 Anemia in other chronic diseases classified elsewhere: Secondary | ICD-10-CM | POA: Diagnosis not present

## 2021-08-04 DIAGNOSIS — J9600 Acute respiratory failure, unspecified whether with hypoxia or hypercapnia: Secondary | ICD-10-CM

## 2021-08-04 DIAGNOSIS — F419 Anxiety disorder, unspecified: Secondary | ICD-10-CM | POA: Diagnosis not present

## 2021-08-04 DIAGNOSIS — F05 Delirium due to known physiological condition: Secondary | ICD-10-CM | POA: Diagnosis not present

## 2021-08-04 DIAGNOSIS — F0154 Vascular dementia, unspecified severity, with anxiety: Secondary | ICD-10-CM | POA: Diagnosis present

## 2021-08-04 DIAGNOSIS — I129 Hypertensive chronic kidney disease with stage 1 through stage 4 chronic kidney disease, or unspecified chronic kidney disease: Secondary | ICD-10-CM | POA: Diagnosis present

## 2021-08-04 DIAGNOSIS — Z9861 Coronary angioplasty status: Secondary | ICD-10-CM

## 2021-08-04 DIAGNOSIS — L89153 Pressure ulcer of sacral region, stage 3: Secondary | ICD-10-CM | POA: Diagnosis present

## 2021-08-04 DIAGNOSIS — E43 Unspecified severe protein-calorie malnutrition: Secondary | ICD-10-CM | POA: Diagnosis present

## 2021-08-04 DIAGNOSIS — G319 Degenerative disease of nervous system, unspecified: Secondary | ICD-10-CM | POA: Diagnosis not present

## 2021-08-04 DIAGNOSIS — R509 Fever, unspecified: Secondary | ICD-10-CM | POA: Diagnosis not present

## 2021-08-04 DIAGNOSIS — I6389 Other cerebral infarction: Secondary | ICD-10-CM | POA: Diagnosis not present

## 2021-08-04 DIAGNOSIS — M8448XA Pathological fracture, other site, initial encounter for fracture: Secondary | ICD-10-CM | POA: Diagnosis present

## 2021-08-04 DIAGNOSIS — I639 Cerebral infarction, unspecified: Secondary | ICD-10-CM | POA: Diagnosis not present

## 2021-08-04 DIAGNOSIS — M47814 Spondylosis without myelopathy or radiculopathy, thoracic region: Secondary | ICD-10-CM | POA: Diagnosis not present

## 2021-08-04 DIAGNOSIS — E870 Hyperosmolality and hypernatremia: Secondary | ICD-10-CM | POA: Diagnosis present

## 2021-08-04 DIAGNOSIS — F32A Depression, unspecified: Secondary | ICD-10-CM | POA: Diagnosis not present

## 2021-08-04 DIAGNOSIS — M19011 Primary osteoarthritis, right shoulder: Secondary | ICD-10-CM | POA: Diagnosis not present

## 2021-08-04 DIAGNOSIS — Z7401 Bed confinement status: Secondary | ICD-10-CM

## 2021-08-04 DIAGNOSIS — M8580 Other specified disorders of bone density and structure, unspecified site: Secondary | ICD-10-CM | POA: Diagnosis present

## 2021-08-04 HISTORY — DX: Shock, unspecified: R57.9

## 2021-08-04 LAB — COMPREHENSIVE METABOLIC PANEL
ALT: 28 U/L (ref 0–44)
AST: 81 U/L — ABNORMAL HIGH (ref 15–41)
Albumin: <1.5 g/dL — ABNORMAL LOW (ref 3.5–5.0)
Alkaline Phosphatase: 108 U/L (ref 38–126)
Anion gap: 18 — ABNORMAL HIGH (ref 5–15)
BUN: 58 mg/dL — ABNORMAL HIGH (ref 8–23)
CO2: 18 mmol/L — ABNORMAL LOW (ref 22–32)
Calcium: 8.7 mg/dL — ABNORMAL LOW (ref 8.9–10.3)
Chloride: 99 mmol/L (ref 98–111)
Creatinine, Ser: 3.31 mg/dL — ABNORMAL HIGH (ref 0.44–1.00)
GFR, Estimated: 14 mL/min — ABNORMAL LOW (ref >60)
Glucose, Bld: 99 mg/dL (ref 70–99)
Potassium: 4.3 mmol/L (ref 3.5–5.1)
Sodium: 135 mmol/L (ref 135–145)
Total Bilirubin: 1.7 mg/dL — ABNORMAL HIGH (ref 0.3–1.2)
Total Protein: 5.9 g/dL — ABNORMAL LOW (ref 6.5–8.1)

## 2021-08-04 LAB — OCCULT BLOOD X 1 CARD TO LAB, STOOL: Fecal Occult Bld: NEGATIVE

## 2021-08-04 LAB — URINALYSIS, ROUTINE W REFLEX MICROSCOPIC
Bilirubin Urine: NEGATIVE
Glucose, UA: NEGATIVE mg/dL
Ketones, ur: NEGATIVE mg/dL
Nitrite: NEGATIVE
Protein, ur: 30 mg/dL — AB
Specific Gravity, Urine: 1.012 (ref 1.005–1.030)
WBC, UA: >50 WBC/hpf — ABNORMAL HIGH (ref 0–5)
pH: 6 (ref 5.0–8.0)

## 2021-08-04 LAB — I-STAT VENOUS BLOOD GAS, ED
Acid-base deficit: 5 mmol/L — ABNORMAL HIGH (ref 0.0–2.0)
Bicarbonate: 18.3 mmol/L — ABNORMAL LOW (ref 20.0–28.0)
Calcium, Ion: 1.19 mmol/L (ref 1.15–1.40)
HCT: 20 % — ABNORMAL LOW (ref 36.0–46.0)
Hemoglobin: 6.8 g/dL — CL (ref 12.0–15.0)
O2 Saturation: 91 %
Potassium: 4.2 mmol/L (ref 3.5–5.1)
Sodium: 134 mmol/L — ABNORMAL LOW (ref 135–145)
TCO2: 19 mmol/L — ABNORMAL LOW (ref 22–32)
pCO2, Ven: 26.6 mmHg — ABNORMAL LOW (ref 44–60)
pH, Ven: 7.444 — ABNORMAL HIGH (ref 7.25–7.43)
pO2, Ven: 57 mmHg — ABNORMAL HIGH (ref 32–45)

## 2021-08-04 LAB — I-STAT CHEM 8, ED
BUN: 50 mg/dL — ABNORMAL HIGH (ref 8–23)
Calcium, Ion: 1.15 mmol/L (ref 1.15–1.40)
Chloride: 101 mmol/L (ref 98–111)
Creatinine, Ser: 3.7 mg/dL — ABNORMAL HIGH (ref 0.44–1.00)
Glucose, Bld: 101 mg/dL — ABNORMAL HIGH (ref 70–99)
HCT: 18 % — ABNORMAL LOW (ref 36.0–46.0)
Hemoglobin: 6.1 g/dL — CL (ref 12.0–15.0)
Potassium: 4.2 mmol/L (ref 3.5–5.1)
Sodium: 133 mmol/L — ABNORMAL LOW (ref 135–145)
TCO2: 17 mmol/L — ABNORMAL LOW (ref 22–32)

## 2021-08-04 LAB — CBC WITH DIFFERENTIAL/PLATELET
Abs Immature Granulocytes: 1.78 10*3/uL — ABNORMAL HIGH (ref 0.00–0.07)
Basophils Absolute: 0.1 10*3/uL (ref 0.0–0.1)
Basophils Relative: 0 %
Eosinophils Absolute: 0 10*3/uL (ref 0.0–0.5)
Eosinophils Relative: 0 %
HCT: 18 % — ABNORMAL LOW (ref 36.0–46.0)
Hemoglobin: 5.9 g/dL — CL (ref 12.0–15.0)
Immature Granulocytes: 6 %
Lymphocytes Relative: 3 %
Lymphs Abs: 0.8 10*3/uL (ref 0.7–4.0)
MCH: 29.8 pg (ref 26.0–34.0)
MCHC: 32.8 g/dL (ref 30.0–36.0)
MCV: 90.9 fL (ref 80.0–100.0)
Monocytes Absolute: 1.7 10*3/uL — ABNORMAL HIGH (ref 0.1–1.0)
Monocytes Relative: 5 %
Neutro Abs: 27.4 10*3/uL — ABNORMAL HIGH (ref 1.7–7.7)
Neutrophils Relative %: 86 %
Platelets: 379 10*3/uL (ref 150–400)
RBC: 1.98 MIL/uL — ABNORMAL LOW (ref 3.87–5.11)
RDW: 17.9 % — ABNORMAL HIGH (ref 11.5–15.5)
WBC: 31.7 10*3/uL — ABNORMAL HIGH (ref 4.0–10.5)
nRBC: 0.2 % (ref 0.0–0.2)

## 2021-08-04 LAB — ETHANOL: Alcohol, Ethyl (B): <10 mg/dL (ref <10)

## 2021-08-04 LAB — CBG MONITORING, ED
Glucose-Capillary: 101 mg/dL — ABNORMAL HIGH (ref 70–99)
Glucose-Capillary: 86 mg/dL (ref 70–99)

## 2021-08-04 LAB — I-STAT ARTERIAL BLOOD GAS, ED
Acid-base deficit: 7 mmol/L — ABNORMAL HIGH (ref 0.0–2.0)
Bicarbonate: 18.5 mmol/L — ABNORMAL LOW (ref 20.0–28.0)
Calcium, Ion: 1.21 mmol/L (ref 1.15–1.40)
HCT: 17 % — ABNORMAL LOW (ref 36.0–46.0)
Hemoglobin: 5.8 g/dL — CL (ref 12.0–15.0)
O2 Saturation: 100 %
Patient temperature: 99.1
Potassium: 4.1 mmol/L (ref 3.5–5.1)
Sodium: 135 mmol/L (ref 135–145)
TCO2: 20 mmol/L — ABNORMAL LOW (ref 22–32)
pCO2 arterial: 37.5 mmHg (ref 32–48)
pH, Arterial: 7.302 — ABNORMAL LOW (ref 7.35–7.45)
pO2, Arterial: 516 mmHg — ABNORMAL HIGH (ref 83–108)

## 2021-08-04 LAB — CBC
HCT: 22.5 % — ABNORMAL LOW (ref 36.0–46.0)
Hemoglobin: 7.4 g/dL — ABNORMAL LOW (ref 12.0–15.0)
MCH: 29.1 pg (ref 26.0–34.0)
MCHC: 32.9 g/dL (ref 30.0–36.0)
MCV: 88.6 fL (ref 80.0–100.0)
Platelets: 276 10*3/uL (ref 150–400)
RBC: 2.54 MIL/uL — ABNORMAL LOW (ref 3.87–5.11)
RDW: 18.1 % — ABNORMAL HIGH (ref 11.5–15.5)
WBC: 38.1 10*3/uL — ABNORMAL HIGH (ref 4.0–10.5)
nRBC: 0.3 % — ABNORMAL HIGH (ref 0.0–0.2)

## 2021-08-04 LAB — PROTIME-INR
INR: 2.1 — ABNORMAL HIGH (ref 0.8–1.2)
Prothrombin Time: 23.6 seconds — ABNORMAL HIGH (ref 11.4–15.2)

## 2021-08-04 LAB — MAGNESIUM: Magnesium: 1.9 mg/dL (ref 1.7–2.4)

## 2021-08-04 LAB — TROPONIN I (HIGH SENSITIVITY)
Troponin I (High Sensitivity): 36 ng/L — ABNORMAL HIGH (ref <18)
Troponin I (High Sensitivity): 33 ng/L — ABNORMAL HIGH (ref <18)

## 2021-08-04 LAB — PREPARE RBC (CROSSMATCH)

## 2021-08-04 LAB — LACTIC ACID, PLASMA
Lactic Acid, Venous: 5.9 mmol/L (ref 0.5–1.9)
Lactic Acid, Venous: 6.9 mmol/L (ref 0.5–1.9)

## 2021-08-04 LAB — GLUCOSE, CAPILLARY
Glucose-Capillary: 80 mg/dL (ref 70–99)
Glucose-Capillary: 82 mg/dL (ref 70–99)

## 2021-08-04 LAB — MRSA NEXT GEN BY PCR, NASAL: MRSA by PCR Next Gen: NOT DETECTED

## 2021-08-04 LAB — AMMONIA: Ammonia: 16 umol/L (ref 9–35)

## 2021-08-04 LAB — CORTISOL: Cortisol, Plasma: 86.6 ug/dL

## 2021-08-04 MED ORDER — CHLORHEXIDINE GLUCONATE CLOTH 2 % EX PADS
6.0000 | MEDICATED_PAD | Freq: Every day | CUTANEOUS | Status: DC
  Administered 2021-08-04 – 2021-08-24 (×22): 6 via TOPICAL

## 2021-08-04 MED ORDER — NOREPINEPHRINE 4 MG/250ML-% IV SOLN
2.0000 ug/min | INTRAVENOUS | Status: DC
  Administered 2021-08-04: 12 ug/min via INTRAVENOUS
  Filled 2021-08-04: qty 250

## 2021-08-04 MED ORDER — PANTOPRAZOLE INFUSION (NEW) - SIMPLE MED
8.0000 mg/h | INTRAVENOUS | Status: DC
  Administered 2021-08-04 – 2021-08-05 (×2): 8 mg/h via INTRAVENOUS
  Filled 2021-08-04 (×2): qty 80
  Filled 2021-08-04: qty 100

## 2021-08-04 MED ORDER — METRONIDAZOLE 500 MG/100ML IV SOLN
500.0000 mg | Freq: Once | INTRAVENOUS | Status: DC
  Filled 2021-08-04: qty 100

## 2021-08-04 MED ORDER — ETOMIDATE 2 MG/ML IV SOLN
10.0000 mg | Freq: Once | INTRAVENOUS | Status: DC
  Filled 2021-08-04: qty 10

## 2021-08-04 MED ORDER — VANCOMYCIN HCL IN DEXTROSE 1-5 GM/200ML-% IV SOLN: 1000.0000 mg | Freq: Once | INTRAVENOUS | Status: DC

## 2021-08-04 MED ORDER — POLYETHYLENE GLYCOL 3350 17 G PO PACK: 17.0000 g | PACK | Freq: Every day | ORAL | Status: DC

## 2021-08-04 MED ORDER — MIDAZOLAM HCL 2 MG/2ML IJ SOLN
1.0000 mg | INTRAMUSCULAR | Status: DC | PRN
  Filled 2021-08-04: qty 2

## 2021-08-04 MED ORDER — LACTATED RINGERS IV SOLN: INTRAVENOUS | Status: DC

## 2021-08-04 MED ORDER — IPRATROPIUM-ALBUTEROL 0.5-2.5 (3) MG/3ML IN SOLN: 3.0000 mL | RESPIRATORY_TRACT | Status: DC | PRN

## 2021-08-04 MED ORDER — INSULIN ASPART 100 UNIT/ML IJ SOLN
0.0000 [IU] | INTRAMUSCULAR | Status: DC
  Administered 2021-08-05 (×3): 3 [IU] via SUBCUTANEOUS
  Administered 2021-08-05: 2 [IU] via SUBCUTANEOUS
  Administered 2021-08-05 – 2021-08-06 (×2): 3 [IU] via SUBCUTANEOUS
  Administered 2021-08-06: 8 [IU] via SUBCUTANEOUS
  Administered 2021-08-06: 3 [IU] via SUBCUTANEOUS
  Administered 2021-08-06: 8 [IU] via SUBCUTANEOUS
  Administered 2021-08-06 (×2): 5 [IU] via SUBCUTANEOUS
  Administered 2021-08-07: 11 [IU] via SUBCUTANEOUS

## 2021-08-04 MED ORDER — SODIUM CHLORIDE 0.9 % IV SOLN
INTRAVENOUS | Status: DC | PRN
  Administered 2021-08-04: 1000 mL via INTRAVENOUS

## 2021-08-04 MED ORDER — FENTANYL CITRATE PF 50 MCG/ML IJ SOSY: 50.0000 ug | PREFILLED_SYRINGE | INTRAMUSCULAR | Status: DC | PRN

## 2021-08-04 MED ORDER — LORAZEPAM 2 MG/ML IJ SOLN
INTRAMUSCULAR | Status: AC
  Filled 2021-08-04: qty 2

## 2021-08-04 MED ORDER — SODIUM CHLORIDE 0.9% IV SOLUTION: Freq: Once | INTRAVENOUS | Status: AC

## 2021-08-04 MED ORDER — INSULIN ASPART 100 UNIT/ML IJ SOLN: 0.0000 [IU] | Freq: Three times a day (TID) | INTRAMUSCULAR | Status: DC

## 2021-08-04 MED ORDER — NOREPINEPHRINE 4 MG/250ML-% IV SOLN
INTRAVENOUS | Status: AC
  Administered 2021-08-04: 5 mg
  Filled 2021-08-04: qty 250

## 2021-08-04 MED ORDER — PANTOPRAZOLE SODIUM 40 MG IV SOLR: 40.0000 mg | Freq: Two times a day (BID) | INTRAVENOUS | Status: DC

## 2021-08-04 MED ORDER — NOREPINEPHRINE 4 MG/250ML-% IV SOLN
0.0000 ug/min | INTRAVENOUS | Status: DC
  Administered 2021-08-04: 12 ug/min via INTRAVENOUS
  Administered 2021-08-05: 6 ug/min via INTRAVENOUS
  Administered 2021-08-05: 10 ug/min via INTRAVENOUS
  Administered 2021-08-06: 3 ug/min via INTRAVENOUS
  Administered 2021-08-06: 8 ug/min via INTRAVENOUS
  Administered 2021-08-06: 6 ug/min via INTRAVENOUS
  Filled 2021-08-04 (×4): qty 250
  Filled 2021-08-04: qty 500

## 2021-08-04 MED ORDER — ORAL CARE MOUTH RINSE
15.0000 mL | OROMUCOSAL | Status: DC | PRN
Start: 2021-08-04 — End: 2021-08-05

## 2021-08-04 MED ORDER — SODIUM CHLORIDE 0.9% IV SOLUTION
Freq: Once | INTRAVENOUS | Status: AC
Start: 2021-08-04 — End: 2021-08-04

## 2021-08-04 MED ORDER — VANCOMYCIN VARIABLE DOSE PER UNSTABLE RENAL FUNCTION (PHARMACIST DOSING): Status: DC

## 2021-08-04 MED ORDER — LORAZEPAM 2 MG/ML IJ SOLN
INTRAMUSCULAR | Status: DC | PRN
  Administered 2021-08-04: 4 mg via INTRAVENOUS

## 2021-08-04 MED ORDER — FENTANYL CITRATE PF 50 MCG/ML IJ SOSY
25.0000 ug | PREFILLED_SYRINGE | INTRAMUSCULAR | Status: DC | PRN
  Administered 2021-08-08 – 2021-08-21 (×6): 50 ug via INTRAVENOUS
  Filled 2021-08-04 (×8): qty 1
  Filled 2021-08-04: qty 2
  Filled 2021-08-04: qty 1

## 2021-08-04 MED ORDER — PANTOPRAZOLE 80MG IVPB - SIMPLE MED
80.0000 mg | Freq: Once | INTRAVENOUS | Status: DC
  Filled 2021-08-04: qty 100

## 2021-08-04 MED ORDER — SUCCINYLCHOLINE CHLORIDE 200 MG/10ML IV SOSY
80.0000 mg | PREFILLED_SYRINGE | Freq: Once | INTRAVENOUS | Status: AC
  Administered 2021-08-04: 80 mg via INTRAVENOUS
  Filled 2021-08-04: qty 10

## 2021-08-04 MED ORDER — VANCOMYCIN HCL 1250 MG/250ML IV SOLN
1250.0000 mg | Freq: Once | INTRAVENOUS | Status: DC
Start: 2021-08-04 — End: 2021-08-04
  Filled 2021-08-04 (×2): qty 250

## 2021-08-04 MED ORDER — INSULIN ASPART 100 UNIT/ML IJ SOLN: 0.0000 [IU] | Freq: Every day | INTRAMUSCULAR | Status: DC

## 2021-08-04 MED ORDER — LACTATED RINGERS IV BOLUS
1000.0000 mL | Freq: Once | INTRAVENOUS | Status: AC
  Administered 2021-08-04: 1000 mL via INTRAVENOUS

## 2021-08-04 MED ORDER — SODIUM CHLORIDE 0.9 % IV SOLN
2.0000 g | Freq: Once | INTRAVENOUS | Status: AC
  Administered 2021-08-04: 2 g via INTRAVENOUS
  Filled 2021-08-04: qty 12.5

## 2021-08-04 MED ORDER — DEXTROSE 5 % IN LACTATED RINGERS IV BOLUS
1000.0000 mL | Freq: Once | INTRAVENOUS | Status: AC
Start: 2021-08-04 — End: 2021-08-04
  Administered 2021-08-04: 1000 mL via INTRAVENOUS

## 2021-08-04 MED ORDER — ORAL CARE MOUTH RINSE
15.0000 mL | OROMUCOSAL | Status: DC
  Administered 2021-08-04 – 2021-08-05 (×6): 15 mL via OROMUCOSAL

## 2021-08-04 MED ORDER — CEFEPIME HCL 1 G IJ SOLR
1.0000 g | INTRAMUSCULAR | Status: DC
  Filled 2021-08-04: qty 10

## 2021-08-04 MED ORDER — DOCUSATE SODIUM 50 MG/5ML PO LIQD
100.0000 mg | Freq: Two times a day (BID) | ORAL | Status: DC
  Administered 2021-08-06: 100 mg
  Filled 2021-08-04: qty 10

## 2021-08-04 MED ORDER — SODIUM CHLORIDE 0.9 % IV SOLN
250.0000 mL | INTRAVENOUS | Status: DC
  Administered 2021-08-04 – 2021-08-15 (×4): 250 mL via INTRAVENOUS

## 2021-08-04 MED ORDER — PHENYLEPHRINE HCL-NACL 20-0.9 MG/250ML-% IV SOLN
INTRAVENOUS | Status: AC
  Filled 2021-08-04: qty 250

## 2021-08-04 MED ORDER — VANCOMYCIN HCL 1250 MG/250ML IV SOLN
1250.0000 mg | Freq: Once | INTRAVENOUS | Status: AC
Start: 2021-08-04 — End: 2021-08-05
  Administered 2021-08-04: 1250 mg via INTRAVENOUS
  Filled 2021-08-04: qty 250

## 2021-08-04 NOTE — Consult Note (Signed)
Neurology Consultation Reason for Consult: Decreased responsiveness Referring Physician: Halford Chessman, V  CC: Decreased responsiveness  History is obtained from: Chart review  HPI: Courtney Goodman is a 76 y.o. female with a history of diabetes, hypertension, previous stroke who presents with decreased responsiveness.  Apparently she was noted to be altered and have some left sided weakness earlier today.  She had not been eating or drinking very well for a couple of days and today family noticed that she was having left facial droop and not moving her left arm as much.  In the ER, her GCS was a four with pinpoint pupils.  She was hyperglycemic to the 500s.  She was given 4 mg of IV Ativan and intubated.  Her hemoglobin was found to be 5.9 with maps in the 50s, and GI was consulted for possible GI bleed.  She was intubated for airway protection.   LKW: Unclear, past couple of days tpa given?: no, out of window Thrombectomy: No, unstable   Past Medical History:  Diagnosis Date   Anemia of chronic disease    Asthma    Atherosclerotic heart disease of native coronary artery without angina pectoris    CKD (chronic kidney disease)    COPD (chronic obstructive pulmonary disease) (HCC)    DM (diabetes mellitus), type 2 with renal complications (HCC)    Dysrhythmia    Elevated ferritin 01/03/2017   GAD (generalized anxiety disorder)    Hypertension    Mixed hyperlipidemia due to type 2 diabetes mellitus (HCC)    Mixed incontinence    per medical records from [facility]   Osteopenia    Personal history of noncompliance with medical treatment, presenting hazards to health    Shortness of breath dyspnea    Suicidal ideation    TB (tuberculosis), treated    age 1   Vitamin D deficiency      Family History  Problem Relation Age of Onset   Diabetes Mother      Social History:  reports that she has quit smoking. She has never used smokeless tobacco. She reports that she does not drink alcohol  and does not use drugs.   Exam: Current vital signs: BP 99/61   Pulse 96   Temp 98.1 F (36.7 C) (Axillary)   Resp (!) 21   Ht '5\' 5"'$  (1.651 m)   SpO2 98%   BMI 22.47 kg/m  Vital signs in last 24 hours: Temp:  [98.1 F (36.7 C)-99.1 F (37.3 C)] 98.1 F (36.7 C) (07/08 1900) Pulse Rate:  [76-100] 96 (07/08 2001) Resp:  [16-28] 21 (07/08 2001) BP: (82-126)/(41-84) 99/61 (07/08 1900) SpO2:  [86 %-100 %] 98 % (07/08 1900) FiO2 (%):  [40 %-100 %] 40 % (07/08 2001)   Physical Exam  Constitutional: Appears well-developed and well-nourished.  Psych: Affect appropriate to situation Eyes: No scleral injection HENT: No OP obstruction MSK: no joint deformities.  Cardiovascular: Normal rate and regular rhythm.  Respiratory: Effort normal, non-labored breathing GI: Soft.  No distension. There is no tenderness.  Skin: WDI  Neuro: Mental Status: She does not open eyes or follow commands, with repeated noxious stimulation she does grimace, but does not open her eyes. Cranial Nerves: II: She does not blink to threat from either direction pupils are equal, round, and reactive to light.   III,IV, VI: She has a relatively midline gaze for most of the time, though there may be a slight rightward preference.  She resists doll's eye maneuver so difficult to  assess. V/VII: She does blink to eyelid stimulation bilaterally VII: Facial symmetry is difficult to assess due to ET tube placement Motor: She withdraws minimally to noxious stimulation in all four extremities, does not localize Sensory: As above Cerebellar: Does not perform   I have reviewed labs in epic and the results pertinent to this consultation are: Ammonia 16 UA - possible UTI Lactic acid 5.9 WBC 31  Cr 3.31 BUN 58  I have reviewed the images obtained:CT head - negative EEG - slowing, no seizure  Impression: 76 year old female with encephalopathy in the setting of hypotension, severe anemia, AKI with uremia,  septic shock with lactic acidosis.  My suspicion is that her encephalopathy is due to the above-mentioned factors.  It is certainly possible that given her hypotension and anemia that she could have had some degree of cerebral insult including focal cerebral infarction.  She will need an MRI to further characterize this.  Multifocal septic emboli could also explain seeding of the spine, AKI, and cerebral infarcts.  Recommendations: 1) MRI brain 2) continue to treat her underlying medical issues 3) avoid hypotension 4) further recommendations following MRI.   Roland Rack, MD [facility] [phone]  If 7pm- 7am, please page neurology on call as listed in [place].

## 2021-08-04 NOTE — Procedures (Signed)
Central Venous Catheter Insertion Procedure Note  Courtney Goodman  [id number]  [birth year]  Date:08/16/2021  Time:9:05 PM   Provider Performing:Veleka Djordjevic Chauncey Cruel Iona Beard   Procedure: Insertion of Non-tunneled Central Venous [phone]) with US guidance ([id number])   Indication(s) Medication administration  Consent Risks of the procedure as well as the alternatives and risks of each were explained to the patient and/or caregiver.  Consent for the procedure was obtained and is signed in the bedside chart  Anesthesia Topical only with 1% lidocaine   Timeout Verified patient identification, verified procedure, site/side was marked, verified correct patient position, special equipment/implants available, medications/allergies/relevant history reviewed, required imaging and test results available.  Sterile Technique Maximal sterile technique including full sterile barrier drape, hand hygiene, sterile gown, sterile gloves, mask, hair covering, sterile ultrasound probe cover (if used).  Procedure Description Area of catheter insertion was cleaned with chlorhexidine and draped in sterile fashion.  With real-time ultrasound guidance a central venous catheter was placed into the left internal jugular vein. Nonpulsatile blood flow and easy flushing noted in all ports.  The catheter was sutured in place and sterile dressing applied.     Complications/Tolerance None; patient tolerated the procedure well. Chest X-ray is ordered to verify placement for internal jugular or subclavian cannulation.   Chest x-ray is not ordered for femoral cannulation.  EBL Minimal  Specimen(s) None  [employer]., MSN, APRN, AGACNP-BC [facility]  07/29/2021 , 9:05 PM  Please see [email or web address] for pager details  If no response, please call [phone] After hours, please call Elink at [phone]

## 2021-08-04 NOTE — H&P (Signed)
NAME:  Courtney Goodman, MRN:  [id number], DOB:  04-04-1945, LOS: 0 ADMISSION DATE:  08/03/2021, CONSULTATION DATE:  08/04/21 REFERRING MD:  Dr. Armandina Gemma, ER, CHIEF COMPLAINT:  AMS   History of Present Illness:  76 yo female former smoker sent to ER with AMS and new Lt facial droop.  She was in hospital from 05/31/21 to 06/06/21 for AMS, septic shock with Klebsiella bacteremia, hypodense lesion in pancreatic head, and uncontrolled diabetes.  Reported to have fever at home.  Intubated in ER.  Started on ABx and pressors.  Neuro consulted in ER.  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  CT chest/abd/pelvis showed bronchiectasis with mucus plugging and basilar consolidation, improved appearance of pancreatic head, stool balls in rectum, and increasing lytic lesion in superior endplate wedge deformity of L1 with gas concerning for osteomyelitis. Found to have worsening anemia and PRBC ordered.  EEG and MRI imaging ordered.  PCCM asked to arrange for admission to ICU.  Hx from chart and medical team.  Pertinent  Medical History  DM type 2, HTN, HLD, CAD, CVA, Asthma/COPD, CKD 3, Anxiety with depression, Dementia  Significant Hospital Events: Including procedures, antibiotic start and stop dates in addition to other pertinent events   7/08 Admit, neuro consulted  Interim History / Subjective:  On vent, sedated, on pressors, getting EEG.  Objective   Blood pressure (!) 87/41, pulse 94, temperature 98.9 F (37.2 C), temperature source Rectal, resp. rate (!) 23, height '5\' 5"'$  (1.651 m), SpO2 100 %.    Vent Mode: PRVC FiO2 (%):  [100 %] 100 % Set Rate:  [16 bmp] 16 bmp Vt Set:  [450 mL] 450 mL PEEP:  [5 cmH20] 5 cmH20  No intake or output data in the 24 hours ending 08/11/2021 1438 There were no vitals filed for this visit.  Examination:  General - sedated Eyes - pupils reactive ENT - ETT in place Cardiac - regular rate/rhythm, no murmur Chest - scattered rhonchi Abdomen - soft,  decreased bowel sounds Extremities - no cyanosis, clubbing, or edema Skin - no rashes Neuro - not following commands  Resolved Hospital Problem list     Assessment & Plan:   Acute hypoxic respiratory failure with compromised airway. Hx of COPD/asthma and bronchiectasis. - full vent support - goal SpO2 > 92% - f/u CXR, ABG - prn BDs  AMS with Lt facial droop. Hx of CVA, dementia, Anxiety, Depression. - neuro consulted - f/u EEG, MRI brain  Septic shock. - unclear source >> bacterial lower lobar pneumonia, UTI, and/or L1 spine - continue ABx - f/u blood, sputum, urine culture - f/u MRI T and L spine - pressors to keep MAP > 65 - check cortisol  Stool impaction. - disimpacted in ER  Anemia of critical illness. - no obvious source of bleeding - continue protonix infusion for now - f/u CBC after PRBC transfusion 7/08  AKI from shock. CKD 3b. - baseline creatinine 1.59 from 08/01/21 - continue IV fluids - f/u BMET, monitor urine outpt  Severe protein calorie malnutrition. - NPO for now  Best Practice (right click and "Reselect all SmartList Selections" daily)   Diet/type: NPO DVT prophylaxis: SCD GI prophylaxis: PPI Lines: N/A Foley:  N/A Code Status:  full code Last date of multidisciplinary goals of care discussion '[x]'$   Labs   CBC: Recent Labs  Lab 08/01/21 0904 08/02/2021 1159 08/14/2021 1202 08/23/2021 1207  WBC 13.1* 31.7*  --   --   NEUTROABS 11.4*  27.4*  --   --   HGB 7.5* 5.9* 6.1* 6.8*  HCT 22.4* 18.0* 18.0* 20.0*  MCV 89.6 90.9  --   --   PLT 605* 379  --   --     Basic Metabolic Panel: Recent Labs  Lab 08/01/21 0904 07/28/2021 1159 08/03/2021 1202 08/19/2021 1207  NA 133* 135 133* 134*  K 4.0 4.3 4.2 4.2  CL 100 99 101  --   CO2 25 18*  --   --   GLUCOSE 97 99 101*  --   BUN 37* 58* 50*  --   CREATININE 1.59* 3.31* 3.70*  --   CALCIUM 9.7 8.7*  --   --   MG  --  1.9  --   --    GFR: Estimated Creatinine Clearance: 11.6 mL/min (A) (by  C-G formula based on SCr of 3.7 mg/dL (H)). Recent Labs  Lab 08/01/21 0904 08/11/2021 1159  WBC 13.1* 31.7*    Liver Function Tests: Recent Labs  Lab 08/01/21 0904 08/15/2021 1159  AST 34 81*  ALT 12 28  ALKPHOS 102 108  BILITOT 0.8 1.7*  PROT 7.6 5.9*  ALBUMIN 2.7* <1.5*   No results for input(s): "LIPASE", "AMYLASE" in the last 168 hours. No results for input(s): "AMMONIA" in the last 168 hours.  ABG    Component Value Date/Time   PHART 7.517 (H) 10/03/2018 2313   PCO2ART 29.1 (L) 10/03/2018 2313   PO2ART 130.0 (H) 10/03/2018 2313   HCO3 18.3 (L) 08/02/2021 1207   TCO2 19 (L) 08/23/2021 1207   ACIDBASEDEF 5.0 (H) 08/26/2021 1207   O2SAT 91 08/02/2021 1207     Coagulation Profile: No results for input(s): "INR", "PROTIME" in the last 168 hours.  Cardiac Enzymes: No results for input(s): "CKTOTAL", "CKMB", "CKMBINDEX", "TROPONINI" in the last 168 hours.  HbA1C: Hgb A1c MFr Bld  Date/Time Value Ref Range Status  06/01/2021 03:26 AM 10.8 (H) 4.8 - 5.6 % Final    Comment:    (NOTE)         Prediabetes: 5.7 - 6.4         Diabetes: >6.4         Glycemic control for adults with diabetes: <7.0   02/03/2021 11:00 PM 9.3 (H) 4.8 - 5.6 % Final    Comment:    (NOTE) Pre diabetes:          5.7%-6.4%  Diabetes:              >6.4%  Glycemic control for   <7.0% adults with diabetes     CBG: Recent Labs  Lab 08/03/2021 1157  GLUCAP 101*    Review of Systems:   Unable to obtain  Past Medical History:  She,  has a past medical history of Anemia of chronic disease, Asthma, Atherosclerotic heart disease of native coronary artery without angina pectoris, CKD (chronic kidney disease), COPD (chronic obstructive pulmonary disease) ([place]), DM (diabetes mellitus), type 2 with renal complications ([place]), Dysrhythmia, Elevated ferritin (01/03/2017), GAD (generalized anxiety disorder), Hypertension, Mixed hyperlipidemia due to type 2 diabetes mellitus ([place]), Mixed incontinence,  Osteopenia, Personal history of noncompliance with medical treatment, presenting hazards to health, Shortness of breath dyspnea, Suicidal ideation, TB (tuberculosis), treated, and Vitamin D deficiency.   Surgical History:   Past Surgical History:  Procedure Laterality Date   ANKLE RECONSTRUCTION     right   CARPAL TUNNEL RELEASE     COLONOSCOPY  02/2006   Dr April Manson in [place].  for FOBT +.  non-bleeding int hemorrhoids.  bx of "plump" appearing IC valve.   CORONARY ANGIOPLASTY       Social History:   reports that she has quit smoking. She has never used smokeless tobacco. She reports that she does not drink alcohol and does not use drugs.   Family History:  Her family history includes Diabetes in her mother.   Allergies No Known Allergies   Home Medications  Prior to Admission medications   Medication Sig Start Date End Date Taking? Authorizing Provider  albuterol (VENTOLIN HFA) 108 (90 Base) MCG/ACT inhaler Inhale 1-2 puffs into the lungs every 6 (six) hours as needed for wheezing or shortness of breath. 06/15/21   [name], PA-C  Alcohol Swabs (ALCOHOL PREP) PADS Use for testing Patient taking differently: 1 each by Other route 2 (two) times daily. E11.9 08/07/18   Henson, Vickie L, NP-C  aspirin 81 MG EC tablet Take 1 tablet (81 mg total) by mouth daily. 06/13/21   [name], MD  Biotin 10000 MCG TABS Take 10,000 mcg by mouth daily.    [provider]  carvedilol (COREG) 6.25 MG tablet Take 1 tablet (6.25 mg total) by mouth 2 (two) times daily with a meal. 06/06/21 08/01/21  [name], MD  Cholecalciferol 25 MCG (1000 UT) tablet Take 1 tablet by mouth daily. 08/11/19   Henson, Vickie L, NP-C  Continuous Blood Gluc Receiver (FREESTYLE LIBRE 2 READER) DEVI 1 each by Does not apply route daily. 06/07/21   [name], MD  Continuous Blood Gluc Sensor (FREESTYLE LIBRE 2 SENSOR) MISC 1 each by Does not apply route every 14 (fourteen) days. 06/07/21    [name], MD  escitalopram (LEXAPRO) 5 MG tablet Take 1 tablet (5 mg total) by mouth daily. 08/01/21   [name], PA-C  fenofibrate (TRICOR) 145 MG tablet TAKE ONE TABLET BY MOUTH ONCE DAILY 07/12/21   Francis Gaines B, PA-C  folic acid (FOLVITE) 1 MG tablet TAKE ONE TABLET BY MOUTH EVERY MORNING Patient taking differently: Take 1 mg by mouth in the morning. 05/10/21   [name], PA-C  Glucagon 3 MG/DOSE POWD Place 3 mg into the nose once as needed for up to 1 dose. 06/07/21   [name], MD  glucose blood (ONETOUCH VERIO) test strip 1 each by Other route 2 (two) times daily. And lancets 2/day 10/27/20   Renato Shin, MD  insulin aspart (FIASP FLEXTOUCH) 100 UNIT/ML FlexTouch Pen Inject 5-7 Units into the skin 3 (three) times daily before meals. 06/07/21   [name], MD  insulin degludec (TRESIBA FLEXTOUCH) 100 UNIT/ML FlexTouch Pen Inject 20 Units into the skin daily. 06/07/21   [name], MD  insulin NPH Human (NOVOLIN N) 100 UNIT/ML injection Inject 18-20 Units into the skin daily.    [provider]  Insulin Pen Needle 32G X 4 MM MISC Use 4x a day 06/07/21   [name], MD  Lancets [facility] DELICA PLUS [id number]) MISC USE TO test four times A DAY 10/27/20   Renato Shin, MD  memantine (NAMENDA) 10 MG tablet Take 1 tablet (10 mg at night) for 2 weeks, then increase to 1 tablet (10 mg) twice a day Patient taking differently: Take 10 mg by mouth in the morning and at bedtime. 02/01/21   [name], PA-C  omeprazole (PRILOSEC) 40 MG capsule TAKE ONE CAPSULE BY MOUTH EVERY MORNING 07/12/21   Francis Gaines B, PA-C  simvastatin (ZOCOR) 20 MG tablet Take  1 tablet (20 mg total) by mouth at bedtime. 04/20/21   [name], PA-C     Critical care time: 54 minutes  Chesley Mires, MD [place] Pager - [phone] 07/30/2021, 3:06 PM

## 2021-08-04 NOTE — Progress Notes (Addendum)
RT transported pt from ED11 to 2H02 on ventilator. Pt tolerated well, no apparent complications, vitals stable throughout. RN at bedside.

## 2021-08-04 NOTE — Progress Notes (Signed)
RT transported ED11 to CT and back with RN at bedside. No apparent complications, vitals stable throughout.

## 2021-08-04 NOTE — ED Provider Notes (Signed)
[facility] EMERGENCY DEPARTMENT Provider Note   CSN: [id number] Arrival date & time: 07/30/2021  1139     History  Chief Complaint  Patient presents with   Altered Mental Status    Courtney Goodman is a 76 y.o. female.  HPI   76 year old woman with PMHx significant for HTN, HLD, CAD, CVA (~15 years ago), asthma/COPD, T2DM, CKD stage III, anxiety/depression who presented to [facility] ED via EMS for AMS x 3 day, hyperglycemia to 500s and concern for sepsis.   The history is evaluate the family as the patient cannot provide a history at this time due to her obtundation.  The patient was hospitalized from 5/4 to 06/06/2021 for septic shock with blood cultures that grew positive for Klebsiella pneumonia.  The patient improved following antibiotics and was subsequently discharged.  She was also found to have a new pancreatic head hypodensity/mass concerning for walled off necrosis versus pancreatic pseudocyst secondary to pancreatitis per GI.  Per family members, the patient has had decreased alertness since this past Wednesday.  They did notice a left-sided facial droop this morning and that the patient had not been utilizing her left arm as much.  She did not have this droop when she went to bed around 9 PM last night.  Patient also had a fever at home treated with Tylenol.  She has had nothing per oral for the last few days.  Per family members, the patient has a history of anemia, most recently received an iron infusion on 08/01/2021, hemoglobin at that time was 7.5, decreased from 9.1 31-monthprior.  The denies any history of hematemesis, hematochezia or melena.  No dark stools noted.  Home Medications Prior to Admission medications   Medication Sig Start Date End Date Taking? Authorizing Provider  albuterol (VENTOLIN HFA) 108 (90 Base) MCG/ACT inhaler Inhale 1-2 puffs into the lungs every 6 (six) hours as needed for wheezing or shortness of breath. 06/15/21  Yes WIrene Pap  PA-C  aspirin 81 MG EC tablet Take 1 tablet (81 mg total) by mouth daily. 06/13/21  Yes Pahwani, REinar Grad MD  Biotin 10000 MCG TABS Take 10,000 mcg by mouth daily.   Yes [provider]  carvedilol (COREG) 6.25 MG tablet Take 1 tablet (6.25 mg total) by mouth 2 (two) times daily with a meal. 06/06/21 08/23/2021 Yes Pahwani, REinar Grad MD  Cholecalciferol 25 MCG (1000 UT) tablet Take 1 tablet by mouth daily. 08/11/19  Yes Henson, Vickie L, NP-C  escitalopram (LEXAPRO) 5 MG tablet Take 1 tablet (5 mg total) by mouth daily. 08/01/21  Yes Wertman, SCoralee Pesa PA-C  fenofibrate (TRICOR) 145 MG tablet TAKE ONE TABLET BY MOUTH ONCE DAILY Patient taking differently: Take 145 mg by mouth daily. 07/12/21  Yes WFrancis GainesB, PA-C  folic acid (FOLVITE) 1 MG tablet TAKE ONE TABLET BY MOUTH EVERY MORNING Patient taking differently: Take 1 mg by mouth in the morning. 05/10/21  Yes WIrene Pap PA-C  Glucagon 3 MG/DOSE POWD Place 3 mg into the nose once as needed for up to 1 dose. 06/07/21  Yes GPhilemon Kingdom MD  insulin aspart (FIASP FLEXTOUCH) 100 UNIT/ML FlexTouch Pen Inject 5-7 Units into the skin 3 (three) times daily before meals. 06/07/21  Yes GPhilemon Kingdom MD  insulin degludec (TRESIBA FLEXTOUCH) 100 UNIT/ML FlexTouch Pen Inject 20 Units into the skin daily. Patient taking differently: Inject 10 Units into the skin daily. 06/07/21  Yes GPhilemon Kingdom MD  memantine (NAMENDA) 10 MG tablet  Take 1 tablet (10 mg at night) for 2 weeks, then increase to 1 tablet (10 mg) twice a day Patient taking differently: Take 10 mg by mouth in the morning and at bedtime. 02/01/21  Yes Wertman, Coralee Pesa, PA-C  omeprazole (PRILOSEC) 40 MG capsule TAKE ONE CAPSULE BY MOUTH EVERY MORNING Patient taking differently: Take 40 mg by mouth daily. 07/12/21  Yes Francis Gaines B, PA-C  simvastatin (ZOCOR) 20 MG tablet Take 1 tablet (20 mg total) by mouth at bedtime. 04/20/21  Yes Francis Gaines B, PA-C  Alcohol Swabs (ALCOHOL PREP)  PADS Use for testing Patient taking differently: 1 each by Other route 2 (two) times daily. E11.9 08/07/18   Henson, Vickie L, NP-C  Continuous Blood Gluc Sensor (FREESTYLE LIBRE 2 SENSOR) MISC 1 each by Does not apply route every 14 (fourteen) days. 06/07/21   [name], MD  glucose blood (ONETOUCH VERIO) test strip 1 each by Other route 2 (two) times daily. And lancets 2/day 10/27/20   Renato Shin, MD  Insulin Pen Needle 32G X 4 MM MISC Use 4x a day 06/07/21   [name], MD      Allergies    Patient has no known allergies.    Review of Systems   Review of Systems  Unable to perform ROS: Acuity of condition    Physical Exam Updated Vital Signs BP (!) 103/53   Pulse 97   Temp 98.1 F (36.7 C) (Other (Comment)) Comment (Src): axillary  Resp (!) 24   Ht '5\' 5"'$  (1.651 m)   SpO2 92%   BMI 22.47 kg/m  Physical Exam Vitals and nursing note reviewed. Exam conducted with a chaperone present.  Constitutional:      General: She is in acute distress.     Appearance: She is well-developed. She is ill-appearing.     Comments: GCS 6, unresponsive and obtunded  HENT:     Head: Normocephalic and atraumatic.  Eyes:     Conjunctiva/sclera: Conjunctivae normal.  Cardiovascular:     Rate and Rhythm: Normal rate and regular rhythm.  Pulmonary:     Effort: Tachypnea present. No respiratory distress.     Breath sounds: Examination of the right-lower field reveals rhonchi. Rhonchi present.  Abdominal:     Palpations: Abdomen is soft.     Tenderness: There is no abdominal tenderness.  Genitourinary:    Comments: Fecal impaction noted, subsequently disimpacted, fecal occult negative Musculoskeletal:        General: No swelling.     Cervical back: Neck supple.     Comments: Stage 2 lower lumbar pressure ulcer present  Skin:    General: Skin is warm and dry.     Capillary Refill: Capillary refill takes less than 2 seconds.  Neurological:     Mental Status: She is unresponsive.      GCS: GCS eye subscore is 1. GCS verbal subscore is 1. GCS motor subscore is 4.     Comments: LFD present, blowing respirations, pupils pinpoint and minimally reactive, Left gaze preference, withdraws to pain in the LUE, unresponsive to pain the the RUE and bilateral lower extremities.  Psychiatric:        Mood and Affect: Mood normal.     ED Results / Procedures / Treatments   Labs (all labs ordered are listed, but only abnormal results are displayed) Labs Reviewed  COMPREHENSIVE METABOLIC PANEL - Abnormal; Notable for the following components:      Result Value   CO2 18 (*)  BUN 58 (*)    Creatinine, Ser 3.31 (*)    Calcium 8.7 (*)    Total Protein 5.9 (*)    Albumin <1.5 (*)    AST 81 (*)    Total Bilirubin 1.7 (*)    GFR, Estimated 14 (*)    Anion gap 18 (*)    All other components within normal limits  CBC WITH DIFFERENTIAL/PLATELET - Abnormal; Notable for the following components:   WBC 31.7 (*)    RBC 1.98 (*)    Hemoglobin 5.9 (*)    HCT 18.0 (*)    RDW 17.9 (*)    Neutro Abs 27.4 (*)    Monocytes Absolute 1.7 (*)    Abs Immature Granulocytes 1.78 (*)    All other components within normal limits  URINALYSIS, ROUTINE W REFLEX MICROSCOPIC - Abnormal; Notable for the following components:   Color, Urine AMBER (*)    APPearance CLOUDY (*)    Hgb urine dipstick MODERATE (*)    Protein, ur 30 (*)    Leukocytes,Ua MODERATE (*)    WBC, UA >50 (*)    Bacteria, UA RARE (*)    All other components within normal limits  LACTIC ACID, PLASMA - Abnormal; Notable for the following components:   Lactic Acid, Venous 5.9 (*)    All other components within normal limits  PROTIME-INR - Abnormal; Notable for the following components:   Prothrombin Time 23.6 (*)    INR 2.1 (*)    All other components within normal limits  CBG MONITORING, ED - Abnormal; Notable for the following components:   Glucose-Capillary 101 (*)    All other components within normal limits  I-STAT  CHEM 8, ED - Abnormal; Notable for the following components:   Sodium 133 (*)    BUN 50 (*)    Creatinine, Ser 3.70 (*)    Glucose, Bld 101 (*)    TCO2 17 (*)    Hemoglobin 6.1 (*)    HCT 18.0 (*)    All other components within normal limits  I-STAT VENOUS BLOOD GAS, ED - Abnormal; Notable for the following components:   pH, Ven 7.444 (*)    pCO2, Ven 26.6 (*)    pO2, Ven 57 (*)    Bicarbonate 18.3 (*)    TCO2 19 (*)    Acid-base deficit 5.0 (*)    Sodium 134 (*)    HCT 20.0 (*)    Hemoglobin 6.8 (*)    All other components within normal limits  I-STAT ARTERIAL BLOOD GAS, ED - Abnormal; Notable for the following components:   pH, Arterial 7.302 (*)    pO2, Arterial 516 (*)    Bicarbonate 18.5 (*)    TCO2 20 (*)    Acid-base deficit 7.0 (*)    HCT 17.0 (*)    Hemoglobin 5.8 (*)    All other components within normal limits  TROPONIN I (HIGH SENSITIVITY) - Abnormal; Notable for the following components:   Troponin I (High Sensitivity) 36 (*)    All other components within normal limits  TROPONIN I (HIGH SENSITIVITY) - Abnormal; Notable for the following components:   Troponin I (High Sensitivity) 33 (*)    All other components within normal limits  CULTURE, BLOOD (ROUTINE X 2)  CULTURE, BLOOD (ROUTINE X 2)  URINE CULTURE  RESP PANEL BY RT-PCR (FLU A&B, COVID) ARPGX2  CULTURE, RESPIRATORY W GRAM STAIN  MRSA NEXT GEN BY PCR, NASAL  AMMONIA  ETHANOL  MAGNESIUM  CORTISOL  RAPID URINE DRUG SCREEN, HOSP PERFORMED  OCCULT BLOOD X 1 CARD TO LAB, STOOL  HEMOGLOBIN AND HEMATOCRIT, BLOOD  LACTIC ACID, PLASMA  COMPREHENSIVE METABOLIC PANEL  MAGNESIUM  PHOSPHORUS  CBC  BLOOD GAS, ARTERIAL  CBG MONITORING, ED  TYPE AND SCREEN  PREPARE RBC (CROSSMATCH)    EKG EKG Interpretation  Date/Time:  Saturday August 04 2021 14:59:35 EDT Ventricular Rate:  93 PR Interval:  127 QRS Duration: 137 QT Interval:  403 QTC Calculation: 502 R Axis:   122 Text Interpretation: Sinus  rhythm RBBB and LPFB Nonspecific T abnormalities, lateral leads ST depressions inferolaterally Confirmed by Regan Lemming ([id number]) on 08/22/2021 3:38:37 PM  Radiology DG Abd 1 View  Result Date: 08/27/2021 CLINICAL DATA:  Orogastric tube placement. EXAM: ABDOMEN - 1 VIEW COMPARISON:  August 16 22 FINDINGS: Orogastric tube with tip likely transpyloric. Nonobstructive bowel gas pattern. Endotracheal tube partially visualized. No focal airspace consolidation. IMPRESSION: Orogastric tube with tip likely transpyloric. Electronically Signed   By: Fidela Salisbury M.D.   On: 07/30/2021 17:41   DG Chest Portable 1 View  Result Date: 08/03/2021 CLINICAL DATA:  Endotracheal tube retraction EXAM: PORTABLE CHEST 1 VIEW COMPARISON:  Radiograph 08/05/2018 FINDINGS: Endotracheal tube overlies the trachea approximately 1.7 cm above the carina, slightly retracted in comparison to prior. Unchanged cardiomediastinal silhouette. There is no focal airspace consolidation. Streaky left basilar opacities, likely atelectasis. No pleural effusion or pneumothorax. Skin folds overlie the left chest. Bilateral shoulder degenerative changes. No acute osseous abnormality. Thoracic spondylosis. IMPRESSION: Endotracheal tube tip overlies the trachea approximately 1.7 cm above the carina, slightly retracted in comparison to prior. Could consider further retraction by 2.0 cm. Electronically Signed   By: Maurine Simmering M.D.   On: 07/29/2021 15:43   EEG adult  Result Date: 08/05/2021 Derek Jack, MD     08/15/2021  3:48 PM Routine EEG Report Courtney Goodman is a 76 y.o. female with a history of encephalopathy who is undergoing an EEG to evaluate for seizures. Report: This EEG was acquired with electrodes placed according to the International 10-20 electrode system (including Fp1, Fp2, F3, F4, C3, C4, P3, P4, O1, O2, T3, T4, T5, T6, A1, A2, Fz, Cz, Pz). The following electrodes were missing or displaced: none. The best background was low  amplitude 2-3 Hz. There was significant artifact throughout the recording due to EMG as she is biting the tube. With that caveat, no grossly epileptiform abnormalities or electrographic seizures were seen. No sleep architecture was identified. No focal slowing. Impression and clinical correlation: This EEG was obtained while patient was intubated and had recently received lorazepam and is abnormal due to severe diffuse slowing. There was significant artifact throughout the recording due to EMG as she is biting the tube. With that caveat, no grossly epileptiform abnormalities or electrographic seizures were seen. If clinical concern for seizures persist, consider hooking patient back up to continuous EEG after she returns from MRI. Su Monks, MD [facility] [phone] If 7pm- 7am, please page neurology on call as listed in [place].   DG Chest Port 1 View  Result Date: 08/05/2021 CLINICAL DATA:  ams, s/p intubation EXAM: PORTABLE CHEST 1 VIEW COMPARISON:  None Available. FINDINGS: Endotracheal tube tip is just above the carina. Recommend retraction by approximately 4.5 cm. No confluent consolidation. Mild streaky left basilar opacities, probably atelectasis. No visible pleural effusions or pneumothorax. IMPRESSION: 1. Endotracheal tube tip is just above the carina. Recommend retraction by approximately 4.5 cm. 2. Probable subsegmental left basilar  atelectasis. Electronically Signed   By: Margaretha Sheffield M.D.   On: 08/03/2021 14:07   CT CHEST ABDOMEN PELVIS WO CONTRAST  Result Date: 08/07/2021 CLINICAL DATA:  Sepsis, altered mental status EXAM: CT CHEST, ABDOMEN AND PELVIS WITHOUT CONTRAST TECHNIQUE: Multidetector CT imaging of the chest, abdomen and pelvis was performed following the standard protocol without IV contrast. RADIATION DOSE REDUCTION: This exam was performed according to the departmental dose-optimization program which includes automated exposure control, adjustment of the mA  and/or kV according to patient size and/or use of iterative reconstruction technique. COMPARISON:  CT abdomen pelvis, 05/31/2021, MR abdomen, 06/04/2021 FINDINGS: CT CHEST FINDINGS Cardiovascular: Aortic atherosclerosis. Normal heart size. Three-vessel coronary artery calcifications. No pericardial effusion. Mediastinum/Nodes: No enlarged mediastinal, hilar, or axillary lymph nodes. Thyroid gland, trachea, and esophagus demonstrate no significant findings. Lungs/Pleura: Endotracheal intubation, tube tip within the left mainstem bronchus (series 5, image 44). Scattered areas of bandlike scarring throughout the lungs, with generally unchanged bronchiectasis, bronchiolar plugging, and consolidation in the left greater than right lung bases (series 4, image 92) no pleural effusion or pneumothorax. Musculoskeletal: No chest wall mass. CT ABDOMEN PELVIS FINDINGS Hepatobiliary: No solid liver abnormality is seen. No gallstones, gallbladder wall thickening, or biliary dilatation. Pancreas: Previously seen pancreatic head lesion appears resolved (series 3, image 61). Did I no pancreatic ductal dilatation or surrounding inflammatory changes. Spleen: Normal in size without significant abnormality. Adrenals/Urinary Tract: Adrenal glands are unremarkable. Kidneys are normal, without renal calculi, solid lesion, or hydronephrosis. Bladder is unremarkable. Stomach/Bowel: Stomach is within normal limits. Appendix appears normal. No evidence of bowel wall thickening, distention, or inflammatory changes. Moderate burden of stool throughout the colon, with a large, dense stool ball in the rectum measuring at least 11.1 cm (series 3, image 104, series 6, image 58). Vascular/Lymphatic: Aortic atherosclerosis and vascular calcinosis. No enlarged abdominal or pelvic lymph nodes. Benign, calcified portacaval lymph nodes. Reproductive: Status post hysterectomy. Other: No abdominal wall hernia or abnormality. No ascites. Musculoskeletal:  Increasingly lytic appearance of a superior endplate wedge deformity of L1, with new development of gas within the disc space and vertebral body (series 6, image 61) IMPRESSION: 1. Endotracheal intubation, tube tip within the left mainstem bronchus. Recommend slight retraction. 2. Increasingly lytic appearance of a superior endplate wedge deformity of L1, with new development of gas within the disc space and vertebral body, concerning for discitis osteomyelitis in the setting of sepsis. Contrast enhanced MRI may be used to further evaluate if desired. 3. Unchanged bronchiectasis, bronchiolar plugging, and consolidation in the left greater than right lung bases, consistent with sequelae of infection or aspiration, which may be ongoing. 4. Previously seen pancreatic head lesion appears resolved, consistent with resolution of acute pancreatic fluid collection as characterized by prior MR. 5. Large, dense stool balls in the rectum. Correlate for fecal impaction. 6. Coronary artery disease. Aortic Atherosclerosis (ICD10-I70.0). Electronically Signed   By: Delanna Ahmadi M.D.   On: 07/29/2021 13:54   CT HEAD WO CONTRAST  Result Date: 08/20/2021 CLINICAL DATA:  Altered mental status EXAM: CT HEAD WITHOUT CONTRAST CT CERVICAL SPINE WITHOUT CONTRAST TECHNIQUE: Multidetector CT imaging of the head and cervical spine was performed following the standard protocol without intravenous contrast. Multiplanar CT image reconstructions of the cervical spine were also generated. RADIATION DOSE REDUCTION: This exam was performed according to the departmental dose-optimization program which includes automated exposure control, adjustment of the mA and/or kV according to patient size and/or use of iterative reconstruction technique. COMPARISON:  05/31/2021 FINDINGS: CT  HEAD FINDINGS Brain: No evidence of acute infarction, hemorrhage, hydrocephalus, extra-axial collection or mass lesion/mass effect. Periventricular and deep white matter  hypodensity. Nonacute lacunar infarction of the right caudate (series 5, image 13) and left thalamus (series 5, image 14). Vascular: No hyperdense vessel or unexpected calcification. Skull: Normal. Negative for fracture or focal lesion. Sinuses/Orbits: No acute finding. Other: None. CT CERVICAL SPINE FINDINGS Alignment: Degenerative straightening of the normal cervical lordosis. Skull base and vertebrae: No acute fracture. No primary bone lesion or focal pathologic process. Soft tissues and spinal canal: No prevertebral fluid or swelling. No visible canal hematoma. Disc levels: Moderate disc space height loss and osteophytosis of C3-4 and C5-6 with otherwise mild disc space height loss and osteophytosis. Upper chest: Negative. Other: None. IMPRESSION: 1. No acute intracranial pathology. 2. Small-vessel white matter disease and nonacute lacunar infarctions of the right caudate and left thalamus. 3. No fracture or static subluxation of the cervical spine. Mild to moderate multilevel cervical disc degenerative disease. Electronically Signed   By: Delanna Ahmadi M.D.   On: 07/28/2021 13:37   CT CERVICAL SPINE WO CONTRAST  Result Date: 07/30/2021 CLINICAL DATA:  Altered mental status EXAM: CT HEAD WITHOUT CONTRAST CT CERVICAL SPINE WITHOUT CONTRAST TECHNIQUE: Multidetector CT imaging of the head and cervical spine was performed following the standard protocol without intravenous contrast. Multiplanar CT image reconstructions of the cervical spine were also generated. RADIATION DOSE REDUCTION: This exam was performed according to the departmental dose-optimization program which includes automated exposure control, adjustment of the mA and/or kV according to patient size and/or use of iterative reconstruction technique. COMPARISON:  05/31/2021 FINDINGS: CT HEAD FINDINGS Brain: No evidence of acute infarction, hemorrhage, hydrocephalus, extra-axial collection or mass lesion/mass effect. Periventricular and deep white  matter hypodensity. Nonacute lacunar infarction of the right caudate (series 5, image 13) and left thalamus (series 5, image 14). Vascular: No hyperdense vessel or unexpected calcification. Skull: Normal. Negative for fracture or focal lesion. Sinuses/Orbits: No acute finding. Other: None. CT CERVICAL SPINE FINDINGS Alignment: Degenerative straightening of the normal cervical lordosis. Skull base and vertebrae: No acute fracture. No primary bone lesion or focal pathologic process. Soft tissues and spinal canal: No prevertebral fluid or swelling. No visible canal hematoma. Disc levels: Moderate disc space height loss and osteophytosis of C3-4 and C5-6 with otherwise mild disc space height loss and osteophytosis. Upper chest: Negative. Other: None. IMPRESSION: 1. No acute intracranial pathology. 2. Small-vessel white matter disease and nonacute lacunar infarctions of the right caudate and left thalamus. 3. No fracture or static subluxation of the cervical spine. Mild to moderate multilevel cervical disc degenerative disease. Electronically Signed   By: Delanna Ahmadi M.D.   On: 08/10/2021 13:37    Procedures Ultrasound ED Echo  Date/Time: 08/08/2021 1:05 PM  Performed by: Regan Lemming, MD Authorized by: Regan Lemming, MD   Procedure details:    Indications: hypotension     Views: subxiphoid, parasternal long axis view, parasternal short axis view and IVC view     Images: not archived   Findings:    Pericardium: no pericardial effusion     Cardiac Activity: hyperdynamic     LV Function: normal (>50% EF)     IVC: collapsed   Impression:    Impression: probable low CVP   .Critical Care  Performed by: Regan Lemming, MD Authorized by: Regan Lemming, MD   Critical care provider statement:    Critical care time (minutes):  84   Critical care was necessary to treat  or prevent imminent or life-threatening deterioration of the following conditions:  Shock   Critical care was time spent personally by  me on the following activities:  Development of treatment plan with patient or surrogate, discussions with consultants, evaluation of patient's response to treatment, examination of patient, ordering and review of laboratory studies, ordering and review of radiographic studies, ordering and performing treatments and interventions, pulse oximetry, re-evaluation of patient's condition and review of old charts   Care discussed with: admitting provider   Procedure Name: Intubation Date/Time: 08/27/2021 1:06 PM  Performed by: Regan Lemming, MDPre-anesthesia Checklist: Patient identified, Patient being monitored, Emergency Drugs available, Timeout performed and Suction available Oxygen Delivery Method: Non-rebreather mask Preoxygenation: Pre-oxygenation with 100% oxygen Induction Type: Rapid sequence Ventilation: Mask ventilation without difficulty Laryngoscope Size: Glidescope and 3 Grade View: Grade I Tube size: 7.0 mm Number of attempts: 1 Placement Confirmation: ETT inserted through vocal cords under direct vision, CO2 detector and Breath sounds checked- equal and bilateral Secured at: 24 cm Comments: Desaturation to 59% rapidly improved with BVM via ETT, ETT confirmed via color change, visualization of the ETT through the vocal cords, bilateral breath sounds s/p intubation    Fecal disimpaction  Date/Time: 08/22/2021 1:33 PM  Performed by: Regan Lemming, MD Authorized by: Regan Lemming, MD  Consent: The procedure was performed in an emergent situation. Patient identity confirmed: arm band Time out: Immediately prior to procedure a "time out" was called to verify the correct patient, procedure, equipment, support staff and site/side marked as required.  Sedation: Patient sedated: yes Sedation type: moderate (conscious) sedation Sedatives: fentanyl  Patient tolerance: patient tolerated the procedure well with no immediate complications       Medications Ordered in ED Medications   LORazepam (ATIVAN) 2 MG/ML injection (has no administration in time range)  lactated ringers infusion (has no administration in time range)  metroNIDAZOLE (FLAGYL) IVPB 500 mg (has no administration in time range)  0.9 %  sodium chloride infusion (Manually program via Guardrails IV Fluids) (has no administration in time range)  vancomycin (VANCOREADY) IVPB 1250 mg/250 mL (has no administration in time range)  vancomycin variable dose per unstable renal function (pharmacist dosing) (has no administration in time range)  ceFEPIme (MAXIPIME) 1 g in sodium chloride 0.9 % 100 mL IVPB (has no administration in time range)  pantoprazole (PROTONIX) 80 mg /NS 100 mL IVPB (has no administration in time range)  pantoprozole (PROTONIX) 80 mg /NS 100 mL infusion (has no administration in time range)  pantoprazole (PROTONIX) injection 40 mg (has no administration in time range)  docusate (COLACE) 50 MG/5ML liquid 100 mg (has no administration in time range)  polyethylene glycol (MIRALAX / GLYCOLAX) packet 17 g (has no administration in time range)  Oral care mouth rinse (has no administration in time range)  Oral care mouth rinse (has no administration in time range)  fentaNYL (SUBLIMAZE) injection 25-100 mcg (has no administration in time range)  midazolam (VERSED) injection 1 mg (has no administration in time range)  insulin aspart (novoLOG) injection 0-15 Units (has no administration in time range)  ipratropium-albuterol (DUONEB) 0.5-2.5 (3) MG/3ML nebulizer solution 3 mL (has no administration in time range)  Chlorhexidine Gluconate Cloth 2 % PADS 6 each (has no administration in time range)  0.9 %  sodium chloride infusion (has no administration in time range)  norepinephrine (LEVOPHED) '4mg'$  in 232m (0.016 mg/mL) premix infusion (has no administration in time range)  lactated ringers bolus 1,000 mL (1,000 mLs Intravenous New Bag/Given 08/03/2021  1406)  succinylcholine (ANECTINE) syringe 80 mg (80 mg  Intravenous Given 07/28/2021 1239)  norepinephrine (LEVOPHED) 4-5 MG/250ML-% infusion SOLN (5 mg  New Bag/Given 08/26/2021 1316)  ceFEPIme (MAXIPIME) 2 g in sodium chloride 0.9 % 100 mL IVPB (2 g Intravenous New Bag/Given 08/16/2021 1357)    ED Course/ Medical Decision Making/ A&P Clinical Course as of 08/19/2021 1825  Sat Aug 04, 2021  1252 BP(!): 88/45 [JL]  1252 Resp(!): 27 [JL]  1252 Hemoglobin(!!): 6.8 [JL]  1252 BUN(!): 50 [JL]  1252 Creatinine(!): 3.70 [JL]  1334 WBC(!): 31.7 [JL]  1335 Hemoglobin(!!): 5.9 [JL]  1440 Bacteria, UA(!): RARE [JL]  1440 WBC, UA(!): >50 [JL]  1440 Chalmers Guest): MODERATE [JL]  1537 Lactic Acid, Venous(!!): 5.9 [JL]    Clinical Course User Index [JL] Regan Lemming, MD                           Medical Decision Making Amount and/or Complexity of Data Reviewed Labs: ordered. Decision-making details documented in ED Course. Radiology: ordered.  Risk Prescription drug management. Decision regarding hospitalization.   76 year old woman with PMHx significant for HTN, HLD, CAD, CVA (~15 years ago), asthma/COPD, T2DM, CKD stage III, anxiety/depression who presented to [facility] ED via EMS for AMS x 3 day, hyperglycemia to 500s and concern for sepsis.   The history is evaluate the family as the patient cannot provide a history at this time due to her obtundation.  The patient was hospitalized from 5/4 to 06/06/2021 for septic shock with blood cultures that grew positive for Klebsiella pneumonia.  The patient improved following antibiotics and was subsequently discharged.  She was also found to have a new pancreatic head hypodensity/mass concerning for walled off necrosis versus pancreatic pseudocyst secondary to pancreatitis per GI.  Per family members, the patient has had decreased alertness since this past Wednesday.  They did notice a left-sided facial droop this morning and that the patient had not been utilizing her left arm as much.  She did not have this droop when  she went to bed around 9 PM last night.  Patient also had a fever at home treated with Tylenol.  She has had nothing per oral for the last few days.  Per family members, the patient has a history of anemia, most recently received an iron infusion on 08/01/2021, hemoglobin at that time was 7.5, decreased from 9.1 55-monthprior.  The denies any history of hematemesis, hematochezia or melena.  No dark stools noted.  On arrival, the patient was in shock.  IV access was obtained previously by EMS as the patient arrived with bilateral IVs and IV fluid boluses were initiated.  The patient received 2 L LR via pressure bag with persistent hypotension noted.  She remains unresponsive following initial attempts at resuscitation and was started on Levophed to maintain her blood pressure.  At bedside point-of-care ultrasound was performed which revealed no evidence of pericardial effusion/tamponade, collapsible IVC indicating hypovolemia.  Due to persistent GCS of 6, unresponsiveness, I discussed the need for emergent intubation with the patient's daughter who is her healthcare POA, DLaurel Dimmer [phone].  She wished that the patient would remain full code and the decision was made to proceed with emergent intubation.  Intubation was successful on first attempt with initial desaturation of 59% which rapidly corrected via BVM via ETT. ETT confirmed via color change, visualization of the ETT through the vocal cords, bilateral breath sounds s/p intubation. CXR confirmed placement.  Differential diagnosis of the patient's altered mental status and obtundation includes intracranial hemorrhage, CVA, sepsis and toxic metabolic encephalopathy, electrolyte abnormality, hypovolemia, subclinical seizure, retroperitoneal bleed, intraabdominal catastrophe.  The patient was administered 4 mg of Ativan just prior to intubation with no improvement in her mental status.   Initial i-STAT revealed a Chem-8 with a sodium of 133,  potassium 4.2, BUN 50, creatinine 3.7 (patient has CKD), hemoglobin notably 6.1 a decrease from 7.5 3 days ago.  No reported history of GI bleeding and the patient recently received an iron infusion.  The patient's daughter was consented for blood product administration and 1 unit PRBCs was administered.  Differential additionally includes GI bleed, hemorrhage of unclear source.  The patient's CT head was reviewed by myself and interpreted by myself revealed no acute intracranial hemorrhage.  Neurology was consulted for further recommendations due to concern for possible CVA versus subclinical status.  A code sepsis was initiated and the patient was started on broad-spectrum antibiotics to include cefepime, vancomycin, Flagyl.  CT chest abdomen pelvis without contrast due to the patient's renal dysfunction was obtained.  CT cervical spine was obtained due to the patient's neurologic deficits on arrival.  The patient was rolled to obtain fecal occult testing due to concern for possible GI bleed with a hemoglobin of 6.1.  A fecal impaction was noted after the patient was rolled.  The patient was subsequently disimpacted.  She was found to be fecal occult negative.  A stage II pressure ulcer in the lower lumbar spine was also noted with surrounding erythema/inflammatory changes.  I spoke with Dr. Lorrin Goodell of on-call neurology who agreed with the plan for MRI brain, EEG monitoring.  I placed a consult to gastroenterology due to concern for possible GI bleed.  Laboratory evaluation interpreted myself significant for leukocytosis to 31.7, anemia to 5.9, magnesium normal, initial troponin elevated to 36, urinalysis collected via Foley catheterization and pending.  Given the patient's fecal impaction, distended bladder and rectum, consideration given to stercoral colitis, fecal impaction resulting in urinary obstruction and urinary tract infection with resultant sepsis and septic shock.  Chest x-ray was  performed and revealed ET tube just above the carina.  I asked respiratory to retract it by 3 cm.  Post retraction chest x-ray was ordered.  CT of the head and cervical spine was performed without acute intracranial abnormality or fracture or malalignment of the cervical spine.  CT chest abdomen pelvis revealed large impaction of stool.  The patient was disimpacted after CT imaging. IMPRESSION:  1. Endotracheal intubation, tube tip within the left mainstem  bronchus. Recommend slight retraction.  2. Increasingly lytic appearance of a superior endplate wedge  deformity of L1, with new development of gas within the disc space  and vertebral body, concerning for discitis osteomyelitis in the  setting of sepsis. Contrast enhanced MRI may be used to further  evaluate if desired.  3. Unchanged bronchiectasis, bronchiolar plugging, and consolidation  in the left greater than right lung bases, consistent with sequelae  of infection or aspiration, which may be ongoing.  4. Previously seen pancreatic head lesion appears resolved,  consistent with resolution of acute pancreatic fluid collection as  characterized by prior MR.  5. Large, dense stool balls in the rectum. Correlate for fecal  impaction.  6. Coronary artery disease.    Given the concern for septic shock, need for endotracheal intubation in the setting of unresponsiveness, pulmonary critical care was consulted for admission to the ICU and the patient was  subsequently admitted in critical condition.  On repeat assessment, the patient remained in shock requiring Levophed to maintain her blood pressures after over 30cc/kg of IVF resuscitation.  At admission, the patient's urinalysis resulted positive for UTI. With gas noted in the disc space and vertebral body, concern for osteomyelitis and epidural abscess. Pt also with concern for UTI, pneumonia. Multiple possible sources for infection/septic shock present. MRI of thoracic and lumbar spine to be  formed in addition to the patient's MRI brain. Pt was then admitted in critical condition.    Final Clinical Impression(s) / ED Diagnoses Final diagnoses:  Glasgow coma scale total score 3-8, at arrival to emergency department [facility])  Dementia, unspecified dementia severity, unspecified dementia type, unspecified whether behavioral, psychotic, or mood disturbance or anxiety ([place])  Shock ([place])  Fecal impaction ([place])  Sepsis with acute organ dysfunction and septic shock, due to unspecified organism, unspecified type ([place])  Urinary tract infection with hematuria, site unspecified  Pneumonia due to infectious organism, unspecified laterality, unspecified part of lung    Rx / DC Orders ED Discharge Orders     None         Regan Lemming, MD 07/31/2021 1825

## 2021-08-04 NOTE — Progress Notes (Signed)
EEG complete - results pending 

## 2021-08-04 NOTE — Progress Notes (Signed)
eLink Physician-Brief Progress Note Patient Name: Courtney Goodman DOB: [birth year] MRN: [id number]   Date of Service  08/18/2021  HPI/Events of Note  CBG trending down. Change continuous fluids to include dextrose? Has LR '@150'$  right now.   Change Levo dose to central line instead of peripheral. CVC has been placed.  eICU Interventions  Shifted fluid to D5LR at 100 cc/hr Changed Levophed to central line dosing Discussed with bedside RN     Intervention Category Intermediate Interventions: Hypotension - evaluation and management;Other:  Judd Lien 07/31/2021, 10:47 PM

## 2021-08-04 NOTE — ED Triage Notes (Signed)
Pt BIB EMS for AMS, decreased alertness, family reports new left side facial droop this AM. Fever tx at home with tylenol, pt has not been able to eat or drink past few days

## 2021-08-04 NOTE — Progress Notes (Signed)
   08/11/2021 1500  Clinical Encounter Type  Visited With Health care provider  Visit Type Initial;ED  Referral From Nurse   Chaplain responded to support a family member who was upset with the patient's health condition. By the time chaplain arrived, daughter had left the room. Spiritual care services available as needed.   Jeri Lager, Chaplain

## 2021-08-04 NOTE — Progress Notes (Signed)
Pharmacy Antibiotic Note  Courtney Goodman is a 76 y.o. female admitted on 07/29/2021 presenting with AMS, concern for sepsis.  Pharmacy has been consulted for vancomycin and cefepime dosing.    Plan: Vancomycin 1250 mg IV x 1, then variable dosing d/t unstable renal function Cefepime 2g IV x 1, then 1g IV every 24 hours Monitor renal function, Cx and clinical progression to narrow Vancomycin levels as needed     Temp (24hrs), Avg:99.1 F (37.3 C), Min:99.1 F (37.3 C), Max:99.1 F (37.3 C)  Recent Labs  Lab 08/01/21 0904 08/06/2021 1202  WBC 13.1*  --   CREATININE 1.59* 3.70*    Estimated Creatinine Clearance: 11.6 mL/min (A) (by C-G formula based on SCr of 3.7 mg/dL (H)).    No Known Allergies  Bertis Ruddy, PharmD Clinical Pharmacist ED Pharmacist Phone # [phone] 08/08/2021 1:03 PM

## 2021-08-04 NOTE — Progress Notes (Signed)
MRI will be getting this pt next

## 2021-08-04 NOTE — ED Notes (Signed)
Chaplain called for patient family

## 2021-08-04 NOTE — ED Notes (Signed)
24 at the teeth

## 2021-08-04 NOTE — Consult Note (Signed)
[place] Nurse Consult Note: Reason for Consult:Stage 3 PI noted to sacrum by EDP.  Wound type:Pressure, shear Pressure Injury POA: Yes Measurement:To be obtained by Bedside RN today with first dressing change and document on Nursing Flow Sheet Wound PJA:SNKN, moist Drainage (amount, consistency, odor) small serous Periwound:intact Dressing procedure/placement/frequency: Turning and repositioning is in place, I have added guidance to minimize time in the supine position.Heels are to be floated using pressure redistribution heel boots (Prevalon) provided today. Topical care to the sacral wound will be with soap and water cleanse, rinse and pat dry followed by placement of an antimicrobial nonadherent (xeroform) gauze topped with dry gauze 2x2s. The gauze is to be secured with a sacral silicone foam bordered dressing.  [place] nursing team will not follow, but will remain available to this patient, the nursing and medical teams.  Please re-consult if needed.  Thank you for inviting [place] to participate in this patient's Plan of Care.  Maudie Flakes, MSN, RN, CNS, [place], Serita Grammes, [employer], [employer] phone:  [phone]

## 2021-08-04 NOTE — Progress Notes (Signed)
eLINK following for sepsis protocol. ?

## 2021-08-04 NOTE — Procedures (Signed)
Arterial Catheter Insertion Procedure Note  Courtney Goodman  [id number]  [birth year]  Date:08/06/2021  Time:9:26 PM    Provider Performing: Irineo Axon [place]    Procedure: Insertion of Arterial Line [phone]) without US guidance  Indication(s) Blood pressure monitoring and/or need for frequent ABGs  Consent Unable to obtain consent due to emergent nature of procedure.  Anesthesia None   Time Out Verified patient identification, verified procedure, site/side was marked, verified correct patient position, special equipment/implants available, medications/allergies/relevant history reviewed, required imaging and test results available.   Sterile Technique Maximal sterile technique including full sterile barrier drape, hand hygiene, sterile gown, sterile gloves, mask, hair covering, sterile ultrasound probe cover (if used).   Procedure Description Area of catheter insertion was cleaned with chlorhexidine and draped in sterile fashion. Without real-time ultrasound guidance an arterial catheter was placed into the right radial artery.  Appropriate arterial tracings confirmed on monitor.     Complications/Tolerance None; patient tolerated the procedure well.   EBL Minimal   Specimen(s) None  Aline placed per MD order. Initial BP of 89/36 with monitor reading 89/50. Good blood flow and good tracing on monitor.

## 2021-08-04 NOTE — Procedures (Signed)
Routine EEG Report  Courtney Goodman is a 76 y.o. female with a history of encephalopathy who is undergoing an EEG to evaluate for seizures.  Report: This EEG was acquired with electrodes placed according to the International 10-20 electrode system (including Fp1, Fp2, F3, F4, C3, C4, P3, P4, O1, O2, T3, T4, T5, T6, A1, A2, Fz, Cz, Pz). The following electrodes were missing or displaced: none.  The best background was low amplitude 2-3 Hz. There was significant artifact throughout the recording due to EMG as she is biting the tube. With that caveat, no grossly epileptiform abnormalities or electrographic seizures were seen. No sleep architecture was identified. No focal slowing.   Impression and clinical correlation: This EEG was obtained while patient was intubated and had recently received lorazepam and is abnormal due to severe diffuse slowing. There was significant artifact throughout the recording due to EMG as she is biting the tube. With that caveat, no grossly epileptiform abnormalities or electrographic seizures were seen. If clinical concern for seizures persist, consider hooking patient back up to continuous EEG after she returns from MRI.  Su Monks, MD [facility] [phone]  If 7pm- 7am, please page neurology on call as listed in [place].

## 2021-08-04 NOTE — Progress Notes (Signed)
eLink Physician-Brief Progress Note Patient Name: Courtney Goodman DOB: [birth year] MRN: [id number]   Date of Service  08/19/2021  HPI/Events of Note  Notified of maximum norepinephrine requirement. No brisk bleed but noted to have coffee ground NG output. Transfused 1 unit PRBC earlier for Hgb 5.8.  eICU Interventions  Repeat H/H pending Aline ordered Bedside CCM team informed of need for central line     Intervention Category Intermediate Interventions: Hypotension - evaluation and management  Judd Lien 08/10/2021, 8:15 PM

## 2021-08-05 ENCOUNTER — Inpatient Hospital Stay (HOSPITAL_COMMUNITY): Payer: Medicare HMO

## 2021-08-05 DIAGNOSIS — I6389 Other cerebral infarction: Secondary | ICD-10-CM | POA: Diagnosis not present

## 2021-08-05 DIAGNOSIS — A419 Sepsis, unspecified organism: Secondary | ICD-10-CM

## 2021-08-05 DIAGNOSIS — R4182 Altered mental status, unspecified: Secondary | ICD-10-CM

## 2021-08-05 DIAGNOSIS — R6521 Severe sepsis with septic shock: Secondary | ICD-10-CM | POA: Diagnosis not present

## 2021-08-05 DIAGNOSIS — L8990 Pressure ulcer of unspecified site, unspecified stage: Secondary | ICD-10-CM | POA: Insufficient documentation

## 2021-08-05 LAB — BLOOD CULTURE ID PANEL (REFLEXED) - BCID2

## 2021-08-05 LAB — BPAM RBC
Blood Product Expiration Date: 202308032359
Blood Product Expiration Date: 202308072359
ISSUE DATE / TIME: 202307081723
ISSUE DATE / TIME: 202307082354
Unit Type and Rh: 5100
Unit Type and Rh: 5100

## 2021-08-05 LAB — TYPE AND SCREEN
ABO/RH(D): O POS
Antibody Screen: NEGATIVE
Unit division: 0
Unit division: 0

## 2021-08-05 LAB — COMPREHENSIVE METABOLIC PANEL
ALT: 33 U/L (ref 0–44)
AST: 128 U/L — ABNORMAL HIGH (ref 15–41)
Albumin: <1.5 g/dL — ABNORMAL LOW (ref 3.5–5.0)
Alkaline Phosphatase: 132 U/L — ABNORMAL HIGH (ref 38–126)
Anion gap: 18 — ABNORMAL HIGH (ref 5–15)
BUN: 53 mg/dL — ABNORMAL HIGH (ref 8–23)
CO2: 14 mmol/L — ABNORMAL LOW (ref 22–32)
Calcium: 7.9 mg/dL — ABNORMAL LOW (ref 8.9–10.3)
Chloride: 101 mmol/L (ref 98–111)
Creatinine, Ser: 2.7 mg/dL — ABNORMAL HIGH (ref 0.44–1.00)
GFR, Estimated: 18 mL/min — ABNORMAL LOW (ref >60)
Glucose, Bld: 194 mg/dL — ABNORMAL HIGH (ref 70–99)
Potassium: 3.9 mmol/L (ref 3.5–5.1)
Sodium: 133 mmol/L — ABNORMAL LOW (ref 135–145)
Total Bilirubin: 3.3 mg/dL — ABNORMAL HIGH (ref 0.3–1.2)
Total Protein: 5.4 g/dL — ABNORMAL LOW (ref 6.5–8.1)

## 2021-08-05 LAB — CBC
HCT: 26.2 % — ABNORMAL LOW (ref 36.0–46.0)
Hemoglobin: 8.6 g/dL — ABNORMAL LOW (ref 12.0–15.0)
MCH: 29.2 pg (ref 26.0–34.0)
MCHC: 32.8 g/dL (ref 30.0–36.0)
MCV: 88.8 fL (ref 80.0–100.0)
Platelets: 197 10*3/uL (ref 150–400)
RBC: 2.95 MIL/uL — ABNORMAL LOW (ref 3.87–5.11)
RDW: 17.5 % — ABNORMAL HIGH (ref 11.5–15.5)
WBC: 37.1 10*3/uL — ABNORMAL HIGH (ref 4.0–10.5)
nRBC: 0.2 % (ref 0.0–0.2)

## 2021-08-05 LAB — POCT I-STAT 7, (LYTES, BLD GAS, ICA,H+H)
Acid-base deficit: 10 mmol/L — ABNORMAL HIGH (ref 0.0–2.0)
Bicarbonate: 14.9 mmol/L — ABNORMAL LOW (ref 20.0–28.0)
Calcium, Ion: 1.18 mmol/L (ref 1.15–1.40)
HCT: 27 % — ABNORMAL LOW (ref 36.0–46.0)
Hemoglobin: 9.2 g/dL — ABNORMAL LOW (ref 12.0–15.0)
O2 Saturation: 99 %
Patient temperature: 98.5
Potassium: 3.9 mmol/L (ref 3.5–5.1)
Sodium: 133 mmol/L — ABNORMAL LOW (ref 135–145)
TCO2: 16 mmol/L — ABNORMAL LOW (ref 22–32)
pCO2 arterial: 28.5 mmHg — ABNORMAL LOW (ref 32–48)
pH, Arterial: 7.327 — ABNORMAL LOW (ref 7.35–7.45)
pO2, Arterial: 151 mmHg — ABNORMAL HIGH (ref 83–108)

## 2021-08-05 LAB — ECHOCARDIOGRAM COMPLETE
AR max vel: 2.43 cm2
AV Area VTI: 2.44 cm2
AV Area mean vel: 2.4 cm2
AV Mean grad: 2 mmHg
AV Peak grad: 4.4 mmHg
Ao pk vel: 1.05 m/s
Area-P 1/2: 3.91 cm2
Calc EF: 45.9 %
Height: 65 in
S' Lateral: 2.4 cm
Single Plane A2C EF: 49.9 %
Single Plane A4C EF: 40.7 %

## 2021-08-05 LAB — GLUCOSE, CAPILLARY
Glucose-Capillary: 115 mg/dL — ABNORMAL HIGH (ref 70–99)
Glucose-Capillary: 147 mg/dL — ABNORMAL HIGH (ref 70–99)
Glucose-Capillary: 156 mg/dL — ABNORMAL HIGH (ref 70–99)
Glucose-Capillary: 160 mg/dL — ABNORMAL HIGH (ref 70–99)
Glucose-Capillary: 166 mg/dL — ABNORMAL HIGH (ref 70–99)
Glucose-Capillary: 175 mg/dL — ABNORMAL HIGH (ref 70–99)
Glucose-Capillary: 177 mg/dL — ABNORMAL HIGH (ref 70–99)

## 2021-08-05 LAB — RESP PANEL BY RT-PCR (FLU A&B, COVID) ARPGX2
Influenza A by PCR: NEGATIVE
Influenza B by PCR: NEGATIVE
SARS Coronavirus 2 by RT PCR: NEGATIVE

## 2021-08-05 LAB — PHOSPHORUS
Phosphorus: 4.1 mg/dL (ref 2.5–4.6)
Phosphorus: 4.5 mg/dL (ref 2.5–4.6)
Phosphorus: 3.8 mg/dL (ref 2.5–4.6)

## 2021-08-05 LAB — MAGNESIUM
Magnesium: 1.7 mg/dL (ref 1.7–2.4)
Magnesium: 1.7 mg/dL (ref 1.7–2.4)
Magnesium: 1.7 mg/dL (ref 1.7–2.4)

## 2021-08-05 LAB — LACTIC ACID, PLASMA
Lactic Acid, Venous: 5.3 mmol/L (ref 0.5–1.9)
Lactic Acid, Venous: 4.9 mmol/L (ref 0.5–1.9)

## 2021-08-05 MED ORDER — CEFEPIME HCL 2 G IV SOLR
2.0000 g | INTRAVENOUS | Status: DC
Start: 2021-08-05 — End: 2021-08-05

## 2021-08-05 MED ORDER — ORAL CARE MOUTH RINSE: 15.0000 mL | OROMUCOSAL | Status: DC | PRN

## 2021-08-05 MED ORDER — PERFLUTREN LIPID MICROSPHERE
1.0000 mL | INTRAVENOUS | Status: AC | PRN
  Administered 2021-08-05: 2 mL via INTRAVENOUS

## 2021-08-05 MED ORDER — ORAL CARE MOUTH RINSE
15.0000 mL | OROMUCOSAL | Status: DC
  Administered 2021-08-05 – 2021-08-10 (×60): 15 mL via OROMUCOSAL

## 2021-08-05 MED ORDER — VITAL HIGH PROTEIN PO LIQD
1000.0000 mL | ORAL | Status: DC
  Administered 2021-08-05: 1000 mL

## 2021-08-05 MED ORDER — SODIUM CHLORIDE 0.9 % IV BOLUS
500.0000 mL | Freq: Once | INTRAVENOUS | Status: AC
Start: 2021-08-05 — End: 2021-08-05
  Administered 2021-08-05: 500 mL via INTRAVENOUS

## 2021-08-05 MED ORDER — SODIUM CHLORIDE 0.9 % IV SOLN: INTRAVENOUS | Status: DC

## 2021-08-05 MED ORDER — PROSOURCE TF PO LIQD
45.0000 mL | Freq: Two times a day (BID) | ORAL | Status: DC
Start: 2021-08-05 — End: 2021-08-06
  Administered 2021-08-05 – 2021-08-06 (×3): 45 mL
  Filled 2021-08-05 (×3): qty 45

## 2021-08-05 MED ORDER — SODIUM CHLORIDE 0.9 % IV SOLN
2.0000 g | INTRAVENOUS | Status: DC
  Administered 2021-08-05: 2 g via INTRAVENOUS
  Filled 2021-08-05: qty 12.5

## 2021-08-05 NOTE — Progress Notes (Signed)
[place] Progress Note Patient Name: Courtney Goodman DOB: 31-Aug-1945 MRN: [id number]   Date of Service  08/05/2021  HPI/Events of Note  Received query regarding need for airborne and contact precautions given suspicion of meningitis/encephalitis.   eICU Interventions  Airborne and contact precaution ordered.     Intervention Category Intermediate Interventions: Other:  Elsie Lincoln 08/05/2021, 9:50 PM

## 2021-08-05 NOTE — Progress Notes (Signed)
Patient's [facility] removed from upper left arm prior to MRI. [name] Johnson(daughter)notified.

## 2021-08-05 NOTE — Progress Notes (Signed)
PHARMACY - PHYSICIAN COMMUNICATION CRITICAL VALUE ALERT - BLOOD CULTURE IDENTIFICATION (BCID)  Courtney Goodman is an 76 y.o. female who presented to [facility] on 08/15/2021 with a chief complaint of concern for sepsis. The patient was hospitalized from 5/4 to 06/06/2021 for septic shock with blood cultures that grew positive for Klebsiella pneumonia. Patient currently with AKI sCr 3.7 (up from 1.6 on 08/01/21)  Assessment: Klebsiella pneumoniae in 2/4 bottles  Name of physician (or Provider) Contacted: [place]  Current antibiotics: Cefepime and vancomycin  Changes to prescribed antibiotics recommended: stop vanc and narrow gram (-) antibiotic Recommendations declined by provider due to clinical deterioration  Results for orders placed or performed during the hospital encounter of 08/16/2021  Blood Culture ID Panel (Reflexed) (Collected: 08/01/2021 12:02 PM)  Result Value Ref Range   Enterococcus faecalis NOT DETECTED NOT DETECTED   Enterococcus Faecium NOT DETECTED NOT DETECTED   Listeria monocytogenes NOT DETECTED NOT DETECTED   Staphylococcus species NOT DETECTED NOT DETECTED   Staphylococcus aureus (BCID) NOT DETECTED NOT DETECTED   Staphylococcus epidermidis NOT DETECTED NOT DETECTED   Staphylococcus lugdunensis NOT DETECTED NOT DETECTED   Streptococcus species NOT DETECTED NOT DETECTED   Streptococcus agalactiae NOT DETECTED NOT DETECTED   Streptococcus pneumoniae NOT DETECTED NOT DETECTED   Streptococcus pyogenes NOT DETECTED NOT DETECTED   A.calcoaceticus-baumannii NOT DETECTED NOT DETECTED   Bacteroides fragilis NOT DETECTED NOT DETECTED   Enterobacterales DETECTED (A) NOT DETECTED   Enterobacter cloacae complex NOT DETECTED NOT DETECTED   Escherichia coli NOT DETECTED NOT DETECTED   Klebsiella aerogenes NOT DETECTED NOT DETECTED   Klebsiella oxytoca NOT DETECTED NOT DETECTED   Klebsiella pneumoniae DETECTED (A) NOT DETECTED   Proteus species NOT DETECTED NOT DETECTED    Salmonella species NOT DETECTED NOT DETECTED   Serratia marcescens NOT DETECTED NOT DETECTED   Haemophilus influenzae NOT DETECTED NOT DETECTED   Neisseria meningitidis NOT DETECTED NOT DETECTED   Pseudomonas aeruginosa NOT DETECTED NOT DETECTED   Stenotrophomonas maltophilia NOT DETECTED NOT DETECTED   Candida albicans NOT DETECTED NOT DETECTED   Candida auris NOT DETECTED NOT DETECTED   Candida glabrata NOT DETECTED NOT DETECTED   Candida krusei NOT DETECTED NOT DETECTED   Candida parapsilosis NOT DETECTED NOT DETECTED   Candida tropicalis NOT DETECTED NOT DETECTED   Cryptococcus neoformans/gattii NOT DETECTED NOT DETECTED   CTX-M ESBL NOT DETECTED NOT DETECTED   Carbapenem resistance IMP NOT DETECTED NOT DETECTED   Carbapenem resistance KPC NOT DETECTED NOT DETECTED   Carbapenem resistance NDM NOT DETECTED NOT DETECTED   Carbapenem resist OXA 48 LIKE NOT DETECTED NOT DETECTED   Carbapenem resistance VIM NOT DETECTED NOT DETECTED    Courtney Goodman 08/05/2021  2:32 AM

## 2021-08-05 NOTE — Progress Notes (Addendum)
Pharmacy Antibiotic Note  Courtney Goodman is a 76 y.o. female admitted on 08/01/2021 presenting with AMS, concern for sepsis.  Pharmacy has been consulted for vancomycin and cefepime dosing. Given possible spinal abscess, pharmacy is consulted to adjust dosing for meningitis coverage. BCID showed Klebsiella pneumoniae in 2/4 bottles. Pt experiencing septic shock requiring NE and vent support. Lactic acid has decreased to 4.9 with pressors. WBC is 37.1 and afebrile. AKI present, but beginning to improve.  Plan: Vancomycin variable dosing d/t unstable renal function, last dose 7/8 PM Cefepime 2g IV every 24 hours Monitor renal function, Cx and clinical progression to narrow Vancomycin levels as needed  Height: '5\' 5"'$  (165.1 cm) IBW/kg (Calculated) : 57  Temp (24hrs), Avg:98.3 F (36.8 C), Min:97.7 F (36.5 C), Max:99.1 F (37.3 C)  Recent Labs  Lab 08/01/21 0904 08/22/2021 1159 08/11/2021 1201 08/03/2021 1202 08/18/2021 1729 08/02/2021 2207 08/05/21 0502 08/05/21 0809  WBC 13.1* 31.7*  --   --   --  38.1* 37.1*  --   CREATININE 1.59* 3.31*  --  3.70*  --   --  2.70*  --   LATICACIDVEN  --   --  5.9*  --  6.9*  --  5.3* 4.9*     Estimated Creatinine Clearance: 16 mL/min (A) (by C-G formula based on SCr of 2.7 mg/dL (H)).    No Known Allergies  Thank you for allowing pharmacy to participate in this patient's care.  Reatha Harps, PharmD PGY1 Pharmacy Resident 08/05/2021 10:01 AM Check [email or web address] for unit specific pharmacy number

## 2021-08-05 NOTE — Progress Notes (Signed)
NEUROLOGY CONSULTATION PROGRESS NOTE   Date of service: August 05, 2021 Patient Name: Courtney Goodman Marich MRN:  [id number] DOB:  08/13/1945  Brief HPI  Arya Luttrull Brower is a 76 y.o. female with PMH significant for  has a past medical history of Anemia of chronic disease, Asthma, Atherosclerotic heart disease of native coronary artery without angina pectoris, CKD (chronic kidney disease), COPD (chronic obstructive pulmonary disease) ([place]), DM (diabetes mellitus), type 2 with renal complications ([place]), Dysrhythmia, Elevated ferritin (01/03/2017), GAD (generalized anxiety disorder), Hypertension, Mixed hyperlipidemia due to type 2 diabetes mellitus ([place]), Mixed incontinence, Osteopenia, Personal history of noncompliance with medical treatment, presenting hazards to health, Shortness of breath dyspnea, Suicidal ideation, TB (tuberculosis), treated, and Vitamin D deficiency. Also notable for hospitalization May 2023 d/t Septic shock secondary to Klebsiella pneumonia bacteremia of unclear etiology. She was also found to have a new pancreatic head hypodensity/mass concerning for walled off necrosis versus pancreatic pseudocyst secondary to pancreatitis per GI.  She now presents with decreased responsiveness. Apparently she was noted to be altered and have some left sided weakness earlier today.  She had not been eating or drinking very well for a couple of days and today family noticed that she was having left facial droop and not moving her left arm as much. In the ER, her GCS was a four with pinpoint pupils.  She was hyperglycemic to the 500s.  She was given 4 mg of IV Ativan and intubated.Her hemoglobin was found to be 5.9 with maps in the 50s, and GI was consulted for possible GI bleed.  She was intubated for airway protection.  Patient is followed at [facility] by Dr. Delice Lesch. Her neurologic history includes remote small basal ganglia CVA with chronic left sided weakness, lacunar infarct of left thalamus noted  on HCT May 2023, dementia with neurologic cognitive testing attempt Aug 2021 that was unable to be completed because she was too slow to respond. Last MMSE 02/01/2021 was 8/30. She was admitted at [facility] May 2022 for altered mental status with unclear etiology. Of note, no facial droop was documented and she was ambulating with a cane at that time.  After recent hospitalization May 2023 for Sepsis d/t pancreatitis while on Victoza she was noted at [facility] to have further neurologic decline with daily confusion, not recognizing family, forgetting to swallow and less interactive and had become wheelchair bound. She lives with her daughter.    Interval Hx  Received 2 units PRBCs overnight with hgb now 8. Remains on levophed and protonix infusion. Tmax 99.1. WBC 37,000. No sedation since intubation. Bedside RN reports +cough with suction and withdrawal to noxious stimuli. Also notes coffee ground type drainage from NGT. There are no visitors at bedside on rounds. She is not responsive to verbal or light tactile stimuli on exam.   Vitals   Vitals:   08/05/21 0630 08/05/21 0645 08/05/21 0736 08/05/21 0827  BP:      Pulse: 79 79  78  Resp: (!) 21 (!) 24  20  Temp:   97.7 F (36.5 C)   TempSrc:   Oral   SpO2: 100% 100%  100%  Height:         Body mass index is 22.47 kg/m.  Physical Exam   General: Elderly female intubated on mechanical ventilation without sedation in bilateral mitten restraints  Resp: Even and unlabored  Card: RRR showing on tele Skin: Warm and dry  Mental Status: Patient does not respond to verbal stimuli.  Does not follow commands. No attempts to verbalize.  Cranial Nerves: II: patient does not respond confrontation bilaterally, pupils right 3 mm, left 3 mm,and sluggish bilaterally III,IV,VI: slight resistance to forced eye opening bilaterally  V,VII: corneal reflex present bilaterally  VIII: patient does not respond to verbal stimuli IX,X: gag and cough  present XI: unable to test bilaterally due to unresponsiveness XII: unable to test due to unresponsiveness  Motor: No purposeful movements noted.  No nuchal rigidity not noted.  Sensory: Does respond with withdrawal movement of 4 extremities but not clearly in response to stimuli  Plantars: equivocal in midline  Cerebellar: Unable to perform   Gait: Unable to perform  Labs   Basic Metabolic Panel:  Lab Results  Component Value Date   NA 133 (L) 08/05/2021   K 3.9 08/05/2021   CO2 14 (L) 08/05/2021   GLUCOSE 194 (H) 08/05/2021   BUN 53 (H) 08/05/2021   CREATININE 2.70 (H) 08/05/2021   CALCIUM 7.9 (L) 08/05/2021   GFRNONAA 18 (L) 08/05/2021   GFRAA 22 (L) 10/12/2019   Lab Results  Component Value Date   WBC 37.1 (H) 08/05/2021   HGB 8.6 (L) 08/05/2021   HCT 26.2 (L) 08/05/2021   MCV 88.8 08/05/2021   PLT 197 08/05/2021    HbA1c:  Lab Results  Component Value Date   HGBA1C 10.8 (H) 06/01/2021   LDL:  Lab Results  Component Value Date   LDLCALC 73 05/07/2017   Urine Drug Screen:     Component Value Date/Time   LABOPIA NONE DETECTED 05/31/2021 1153   COCAINSCRNUR NONE DETECTED 05/31/2021 1153   LABBENZ NONE DETECTED 05/31/2021 1153   AMPHETMU NONE DETECTED 05/31/2021 1153   THCU NONE DETECTED 05/31/2021 1153   LABBARB NONE DETECTED 05/31/2021 1153    Alcohol Level     Component Value Date/Time   ETH <10 08/24/2021 1202   Lab Results  Component Value Date   INR 2.1 (H) 08/06/2021   INR 1.5 (H) 06/02/2021    Lab Results  Component Value Date   CREATININE 2.70 (H) 08/05/2021   CREATININE 3.70 (H) 08/24/2021   CREATININE 3.31 (H) 08/08/2021    Lactic Acid 4.9 0800 08/05/21 08/01/2021 Blood culture x2: Klebsiella pneumoniae   Imaging and Diagnostic studies  Results for orders placed during the hospital encounter of 08/10/2021  CT HEAD WO CONTRAST  Narrative CLINICAL DATA:  Altered mental status  EXAM: CT HEAD WITHOUT CONTRAST  CT CERVICAL  SPINE WITHOUT CONTRAST  TECHNIQUE: Multidetector CT imaging of the head and cervical spine was performed following the standard protocol without intravenous contrast. Multiplanar CT image reconstructions of the cervical spine were also generated.  RADIATION DOSE REDUCTION: This exam was performed according to the departmental dose-optimization program which includes automated exposure control, adjustment of the mA and/or kV according to patient size and/or use of iterative reconstruction technique.  COMPARISON:  05/31/2021  FINDINGS: CT HEAD FINDINGS  Brain: No evidence of acute infarction, hemorrhage, hydrocephalus, extra-axial collection or mass lesion/mass effect. Periventricular and deep white matter hypodensity. Nonacute lacunar infarction of the right caudate (series 5, image 13) and left thalamus (series 5, image 14).  Vascular: No hyperdense vessel or unexpected calcification.  Skull: Normal. Negative for fracture or focal lesion.  Sinuses/Orbits: No acute finding.  Other: None.  CT CERVICAL SPINE FINDINGS  Alignment: Degenerative straightening of the normal cervical lordosis.  Skull base and vertebrae: No acute fracture. No primary bone lesion or focal pathologic process.  Soft tissues and  spinal canal: No prevertebral fluid or swelling. No visible canal hematoma.  Disc levels: Moderate disc space height loss and osteophytosis of C3-4 and C5-6 with otherwise mild disc space height loss and osteophytosis.  Upper chest: Negative.  Other: None.  IMPRESSION: 1. No acute intracranial pathology. 2. Small-vessel white matter disease and nonacute lacunar infarctions of the right caudate and left thalamus. 3. No fracture or static subluxation of the cervical spine. Mild to moderate multilevel cervical disc degenerative disease.   Electronically Signed By: Delanna Ahmadi M.D. On: 08/03/2021 13:37  EEG 08/21/2021  Impression and clinical correlation: This EEG  was obtained while patient was intubated and had recently received lorazepam and is abnormal due to severe diffuse slowing. There was significant artifact throughout the recording due to EMG as she is biting the tube. With that caveat, no grossly epileptiform abnormalities or electrographic seizures were seen. If clinical concern for seizures persist, consider hooking patient back up to continuous EEG after she returns from MRI.  Impression   Courtney Goodman is a 76 y.o. female with PMH significant for a past medical history of CVAs of basal ganglia and thalamic regions with chronic left sided weakness, dementia with recently declining neurologic status, Recent hospitalization May 2023 d/t Septic shock secondary to Klebsiella pneumonia bacteremia of unclear etiology. Anemia of chronic disease, Asthma, Atherosclerotic heart disease of native coronary artery without angina pectoris, CKD (chronic kidney disease), COPD (chronic obstructive pulmonary disease) ([place]), DM (diabetes mellitus), type 2 with renal complications ([place]), Dysrhythmia, Elevated ferritin (01/03/2017), GAD (generalized anxiety disorder), Hypertension, Mixed hyperlipidemia due to type 2 diabetes mellitus ([place]), Mixed incontinence, Osteopenia, Personal history of noncompliance with medical treatment, presenting hazards to health, Shortness of breath dyspnea, Suicidal ideation, TB (tuberculosis), treated, and Vitamin D deficiency who presented with decreased responsiveness and possible left facial droop and worsening of her baseline left upper extremity weakness along with poor po intake for a couple of days. In the ER, her GCS was a four with pinpoint pupils. She was hyperglycemic to the 500s. She was given 4 mg of IV Ativan and intubated for airway protection. Her hemoglobin was found to be 5.9 with maps in the 50s for which GI was consulted. Leukocytosis apparent with WBC 38,000 and lactic acid 6.9. CT head showed old Rt caudate and Lt thalamic  infarcts. CT chest/abd/pelvis showed bronchiectasis with mucus plugging and basilar consolidation, improved appearance of pancreatic head, stool balls in rectum, and increasing lytic lesion in superior endplate wedge deformity of L1 with gas concerning for osteomyelitis. AKI also noted. rEEG without seizure activity. Empiric antibiotic regimen and pressors for septic shock were initiated and she was admitted to the ICU. Now with Klebsiella Pneumoniae in blood cultures x2. Neurologic exam off sedation is poor at present.   Recommendations  -Obtain MRIs of brain, cervical, thoracic and lumbar spines without contrast due to AKI on CKD. -Continue Vancomycin, Cefepime and Flagyl at meningitic doses. Add Ampicillin to broaden empiric therapy with concern that L1 lesion could be source with potential for meningitis. Pharmacy consult placed.  -Neuro checks q 2 hours  -Avoid hypotension  -Further recommendations following MRI studies  ______________________________________________________________________   Plan of care directed by Dr. Lorrin Goodell, patient seen and examined with him.  Charlene Brooke, NP-C, MSN [facility] Please see AMION for contact information for neurologic providers.   Thank you for the opportunity to take part in the care of this patient. If you have any further questions, please contact the neurology consultation attending.  Signed,  NEUROHOSPITALIST ADDENDUM Performed a face to face diagnostic evaluation.   I have reviewed the contents of history and physical exam as documented by PA/ARNP/Resident and agree with above documentation.  I have discussed and formulated the above plan as documented. Edits to the note have been made as needed.  Impression/Key exam findings/Plan: In septic shock with somewhat unclear source. Intubated in the ED and at the time of intubation, some concern for L facial droop and L sided weakness but there is documentation of chronic L  sided weakness from prior stroke but no documentation regarding prior L facial droop. CT C/A/P concerning for discitis/osteomyelitis in L1 with gas. In addition, cultures growing klebsiella pneumoniae (3rd time in the last few months), Hemoglobin is down to 5.9 and she was also hypotensive in addition to anemia and now on pressors, AKI on CKD.  Her neuro exam despite not being on any sedation for 12 + hours is very poor. I dont think the etomidate/ativan she got around the time of intubation explains her obtunded mentation at this time. She does seem to have intact brainstem reflexes but otherwise comatose.  Differential for her profound encephalopathy is broad, could be a direct result of septic shock, possible meningitis, might had had hypoxic injury from a combination of hypotension and significant anemia.  Recs: - MRI Brain, C, T, L spine without contrast.(No contrast 2/2 AKI) to evaluate for any abscess specially around the noted lytic L1 lesion and to evaluate for potential meningitis/encephalitis. - hold off on LP until after the MRI. Possibility of seeding into CSF, if she has an epidural abscess in the low back. - cEEG after the MRIs to evaluate for any epileptiform discharge, sublinical seizures. Low suspicion thou given no clinical seizures noted but there was concern regarding focal L sided weakness around the time of intubation. - Would add Ampicillin to broaden meningitis coverage for now. Can narrow based on workup.  This patient is critically ill and at significant risk of neurological worsening, death and care requires constant monitoring of vital signs, hemodynamics,respiratory and cardiac monitoring, neurological assessment, discussion with family, other specialists and medical decision making of high complexity. I spent 40 minutes of neurocritical care time  in the care of  this patient. This was time spent independent of any time provided by nurse practitioner or PA.  Donnetta Simpers [facility] Pager Number [phone] 08/05/2021  2:18 PM   Donnetta Simpers, MD [facility] [phone]   If 7pm to 7am, please call on call as listed on AMION.

## 2021-08-05 NOTE — H&P (Addendum)
NAME:  Courtney Goodman, MRN:  [id number], DOB:  Jun 20, 1945, LOS: 1 ADMISSION DATE:  08/08/2021, CONSULTATION DATE:  08/04/21 REFERRING MD:  Dr. Armandina Gemma, ER, CHIEF COMPLAINT:  AMS   History of Present Illness:  76 yo female former smoker sent to ER with AMS and new Lt facial droop.  She was in hospital from 05/31/21 to 06/06/21 for AMS, septic shock with Klebsiella bacteremia, hypodense lesion in pancreatic head, and uncontrolled diabetes.  Reported to have fever at home.  Intubated in ER.  Started on ABx and pressors.  Neuro consulted in ER.  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  CT chest/abd/pelvis showed bronchiectasis with mucus plugging and basilar consolidation, improved appearance of pancreatic head, stool balls in rectum, and increasing lytic lesion in superior endplate wedge deformity of L1 with gas concerning for osteomyelitis. Found to have worsening anemia and PRBC ordered.  EEG and MRI imaging ordered.  PCCM asked to arrange for admission to ICU.  Hx from chart and medical team.  Pertinent  Medical History  DM type 2, HTN, HLD, CAD, CVA, Asthma/COPD, CKD 3, Anxiety with depression, Dementia  Significant Hospital Events: Including procedures, antibiotic start and stop dates in addition to other pertinent events   7/08 Admit, neuro consulted 7/9 Blood cultures positive for K.pneumoniae  Interim History / Subjective:  Did not tolerate SBT.   Objective   Blood pressure (!) 94/50, pulse 81, temperature 97.7 F (36.5 C), temperature source Oral, resp. rate 20, height '5\' 5"'$  (1.651 m), SpO2 98 %.    Vent Mode: PSV;CPAP FiO2 (%):  [40 %-100 %] 40 % Set Rate:  [16 bmp] 16 bmp Vt Set:  [450 mL] 450 mL PEEP:  [5 cmH20] 5 cmH20 Pressure Support:  [10 cmH20] 10 cmH20 Plateau Pressure:  [15 cmH20] 15 cmH20   Intake/Output Summary (Last 24 hours) at 08/05/2021 1110 Last data filed at 08/05/2021 1000 Gross per 24 hour  Intake 2811.96 ml  Output 515 ml  Net 2296.96 ml    There were no vitals filed for this visit.  Examination:  General - sedated Eyes - pupils reactive ENT - ETT in place Cardiac - regular rate/rhythm, no murmur Chest - scattered rhonchi Abdomen - soft, decreased bowel sounds Extremities - no cyanosis, clubbing, or edema Skin - no rashes Neuro - not following commands  Ancillary tests personally reviewed:   BCID positive again for K.pneumoniae WBC 37.1 Lactate remains elevated.   Assessment & Plan:   Critically ill due to Acute hypoxic respiratory failure with compromised airway. Hx of COPD/asthma and bronchiectasis. - full vent support - goal SpO2 > 92% - f/u CXR, ABG - prn BDs  AMS with Lt facial droop. Hx of CVA, dementia, Anxiety, Depression. - neuro consulted - f/u EEG, MRI brain  Critically ill due to Septic shock due to recurrent K.pneumoniae infection.  - unclear source >> bacterial lower lobar pneumonia, UTI, and/or L1 spine - continue ABx - f/u blood, sputum, urine culture - f/u MRI T and L spine - abscess would be a likely candidate for recurrent bacteremia.  - pressors to keep MAP > 65 - fluid challenge  Stool impaction. - disimpacted in ER  Anemia of critical illness, possible GI bleed due to low HB and CG on NGT aspirate - no obvious source of bleeding - f/u CBC after PRBC transfusion 7/08  AKI from shock. CKD 3b. - baseline creatinine 1.59 from 08/01/21 - continue IV fluids, bolus - f/u BMET, monitor  urine outpt  Severe protein calorie malnutrition. - Start feeds  Best Practice (right click and "Reselect all SmartList Selections" daily)   Diet/type: NPO - feeds  DVT prophylaxis: SCD heparin GI prophylaxis: PPI Lines: N/A Foley:  N/A Code Status:  full code Last date of multidisciplinary goals of care discussion '[x]'$   CRITICAL CARE Performed by: Kipp Brood   Total critical care time: 35 minutes  Critical care time was exclusive of separately billable procedures and treating  other patients.  Critical care was necessary to treat or prevent imminent or life-threatening deterioration.  Critical care was time spent personally by me on the following activities: development of treatment plan with patient and/or surrogate as well as nursing, discussions with consultants, evaluation of patient's response to treatment, examination of patient, obtaining history from patient or surrogate, ordering and performing treatments and interventions, ordering and review of laboratory studies, ordering and review of radiographic studies, pulse oximetry, re-evaluation of patient's condition and participation in multidisciplinary rounds.  Kipp Brood, MD [facility] ICU Physician [place]  Pager: [phone] Mobile: [phone] After hours: [phone].

## 2021-08-05 NOTE — Progress Notes (Signed)
Spoke with infection prevention on-call provider to follow up after speaking with Dr. James Ivanoff concerning possible meningitis and questioning the need for infection precautions.  IP on-call recommended droplet precautions rather than airborne/contact.  Appropriate order placed, will continue to monitor.

## 2021-08-06 ENCOUNTER — Inpatient Hospital Stay (HOSPITAL_COMMUNITY): Payer: Medicare HMO

## 2021-08-06 ENCOUNTER — Ambulatory Visit: Payer: Self-pay

## 2021-08-06 DIAGNOSIS — J9601 Acute respiratory failure with hypoxia: Secondary | ICD-10-CM | POA: Diagnosis not present

## 2021-08-06 DIAGNOSIS — J189 Pneumonia, unspecified organism: Secondary | ICD-10-CM

## 2021-08-06 DIAGNOSIS — F0390 Unspecified dementia without behavioral disturbance: Secondary | ICD-10-CM | POA: Diagnosis not present

## 2021-08-06 DIAGNOSIS — A419 Sepsis, unspecified organism: Secondary | ICD-10-CM | POA: Diagnosis not present

## 2021-08-06 DIAGNOSIS — Z515 Encounter for palliative care: Secondary | ICD-10-CM

## 2021-08-06 DIAGNOSIS — R4182 Altered mental status, unspecified: Secondary | ICD-10-CM | POA: Diagnosis not present

## 2021-08-06 LAB — CBC WITH DIFFERENTIAL/PLATELET
Abs Immature Granulocytes: 1.87 10*3/uL — ABNORMAL HIGH (ref 0.00–0.07)
Basophils Absolute: 0 10*3/uL (ref 0.0–0.1)
Basophils Relative: 0 %
Eosinophils Absolute: 0.1 10*3/uL (ref 0.0–0.5)
Eosinophils Relative: 0 %
HCT: 24.6 % — ABNORMAL LOW (ref 36.0–46.0)
Hemoglobin: 8.3 g/dL — ABNORMAL LOW (ref 12.0–15.0)
Immature Granulocytes: 5 %
Lymphocytes Relative: 2 %
Lymphs Abs: 0.8 10*3/uL (ref 0.7–4.0)
MCH: 29.3 pg (ref 26.0–34.0)
MCHC: 33.7 g/dL (ref 30.0–36.0)
MCV: 86.9 fL (ref 80.0–100.0)
Monocytes Absolute: 1.8 10*3/uL — ABNORMAL HIGH (ref 0.1–1.0)
Monocytes Relative: 4 %
Neutro Abs: 36.4 10*3/uL — ABNORMAL HIGH (ref 1.7–7.7)
Neutrophils Relative %: 89 %
Platelets: 22 10*3/uL — CL (ref 150–400)
RBC: 2.83 MIL/uL — ABNORMAL LOW (ref 3.87–5.11)
RDW: 18.2 % — ABNORMAL HIGH (ref 11.5–15.5)
WBC: 41 10*3/uL — ABNORMAL HIGH (ref 4.0–10.5)
nRBC: 0.2 % (ref 0.0–0.2)

## 2021-08-06 LAB — DIC (DISSEMINATED INTRAVASCULAR COAGULATION)PANEL
D-Dimer, Quant: 3.97 ug/mL-FEU — ABNORMAL HIGH (ref 0.00–0.50)
Fibrinogen: 562 mg/dL — ABNORMAL HIGH (ref 210–475)
INR: 1.8 — ABNORMAL HIGH (ref 0.8–1.2)
Platelets: 20 10*3/uL — CL (ref 150–400)
Prothrombin Time: 20.7 seconds — ABNORMAL HIGH (ref 11.4–15.2)
Smear Review: NONE SEEN
aPTT: 33 seconds (ref 24–36)

## 2021-08-06 LAB — BASIC METABOLIC PANEL
Anion gap: 12 (ref 5–15)
Anion gap: 10 (ref 5–15)
BUN: 58 mg/dL — ABNORMAL HIGH (ref 8–23)
BUN: 62 mg/dL — ABNORMAL HIGH (ref 8–23)
CO2: 14 mmol/L — ABNORMAL LOW (ref 22–32)
CO2: 15 mmol/L — ABNORMAL LOW (ref 22–32)
Calcium: 7.4 mg/dL — ABNORMAL LOW (ref 8.9–10.3)
Calcium: 7.6 mg/dL — ABNORMAL LOW (ref 8.9–10.3)
Chloride: 107 mmol/L (ref 98–111)
Chloride: 109 mmol/L (ref 98–111)
Creatinine, Ser: 2.07 mg/dL — ABNORMAL HIGH (ref 0.44–1.00)
Creatinine, Ser: 1.88 mg/dL — ABNORMAL HIGH (ref 0.44–1.00)
GFR, Estimated: 24 mL/min — ABNORMAL LOW (ref >60)
GFR, Estimated: 27 mL/min — ABNORMAL LOW (ref >60)
Glucose, Bld: 176 mg/dL — ABNORMAL HIGH (ref 70–99)
Glucose, Bld: 211 mg/dL — ABNORMAL HIGH (ref 70–99)
Potassium: 3.2 mmol/L — ABNORMAL LOW (ref 3.5–5.1)
Potassium: 4 mmol/L (ref 3.5–5.1)
Sodium: 133 mmol/L — ABNORMAL LOW (ref 135–145)
Sodium: 134 mmol/L — ABNORMAL LOW (ref 135–145)

## 2021-08-06 LAB — MAGNESIUM
Magnesium: 1.6 mg/dL — ABNORMAL LOW (ref 1.7–2.4)
Magnesium: 2 mg/dL (ref 1.7–2.4)

## 2021-08-06 LAB — CBC
HCT: 26.9 % — ABNORMAL LOW (ref 36.0–46.0)
Hemoglobin: 8.9 g/dL — ABNORMAL LOW (ref 12.0–15.0)
MCH: 29.4 pg (ref 26.0–34.0)
MCHC: 33.1 g/dL (ref 30.0–36.0)
MCV: 88.8 fL (ref 80.0–100.0)
Platelets: 38 10*3/uL — ABNORMAL LOW (ref 150–400)
RBC: 3.03 MIL/uL — ABNORMAL LOW (ref 3.87–5.11)
RDW: 18.2 % — ABNORMAL HIGH (ref 11.5–15.5)
WBC: 33.1 10*3/uL — ABNORMAL HIGH (ref 4.0–10.5)
nRBC: 0.2 % (ref 0.0–0.2)

## 2021-08-06 LAB — RAPID URINE DRUG SCREEN, HOSP PERFORMED
Amphetamines: NOT DETECTED
Barbiturates: NOT DETECTED
Benzodiazepines: NOT DETECTED
Cocaine: NOT DETECTED
Opiates: NOT DETECTED
Tetrahydrocannabinol: NOT DETECTED

## 2021-08-06 LAB — GLUCOSE, CAPILLARY
Glucose-Capillary: 175 mg/dL — ABNORMAL HIGH (ref 70–99)
Glucose-Capillary: 197 mg/dL — ABNORMAL HIGH (ref 70–99)
Glucose-Capillary: 219 mg/dL — ABNORMAL HIGH (ref 70–99)
Glucose-Capillary: 209 mg/dL — ABNORMAL HIGH (ref 70–99)
Glucose-Capillary: 248 mg/dL — ABNORMAL HIGH (ref 70–99)
Glucose-Capillary: 250 mg/dL — ABNORMAL HIGH (ref 70–99)
Glucose-Capillary: 296 mg/dL — ABNORMAL HIGH (ref 70–99)

## 2021-08-06 LAB — VANCOMYCIN, RANDOM: Vancomycin Rm: 13 ug/mL

## 2021-08-06 LAB — PHOSPHORUS
Phosphorus: 3.2 mg/dL (ref 2.5–4.6)
Phosphorus: 2.4 mg/dL — ABNORMAL LOW (ref 2.5–4.6)

## 2021-08-06 LAB — LACTIC ACID, PLASMA: Lactic Acid, Venous: 3.3 mmol/L (ref 0.5–1.9)

## 2021-08-06 LAB — URINE CULTURE: Culture: >=100000 — AB

## 2021-08-06 LAB — TYPE AND SCREEN
ABO/RH(D): O POS
Antibody Screen: NEGATIVE

## 2021-08-06 MED ORDER — PANTOPRAZOLE SODIUM 40 MG IV SOLR
40.0000 mg | Freq: Two times a day (BID) | INTRAVENOUS | Status: DC
  Administered 2021-08-06 – 2021-08-17 (×23): 40 mg via INTRAVENOUS
  Filled 2021-08-06 (×23): qty 10

## 2021-08-06 MED ORDER — SODIUM CHLORIDE 0.9 % IV SOLN
2.0000 g | Freq: Two times a day (BID) | INTRAVENOUS | Status: DC
  Administered 2021-08-06 – 2021-08-16 (×21): 2 g via INTRAVENOUS
  Filled 2021-08-06 (×23): qty 20

## 2021-08-06 MED ORDER — SODIUM CHLORIDE 0.9 % IV BOLUS
500.0000 mL | Freq: Once | INTRAVENOUS | Status: AC
Start: 2021-08-06 — End: 2021-08-06
  Administered 2021-08-06: 500 mL via INTRAVENOUS

## 2021-08-06 MED ORDER — MAGNESIUM SULFATE 2 GM/50ML IV SOLN
2.0000 g | Freq: Once | INTRAVENOUS | Status: AC
  Administered 2021-08-06: 2 g via INTRAVENOUS
  Filled 2021-08-06: qty 50

## 2021-08-06 MED ORDER — VITAL AF 1.2 CAL PO LIQD
1000.0000 mL | ORAL | Status: DC
  Administered 2021-08-06 – 2021-08-09 (×3): 1000 mL

## 2021-08-06 MED ORDER — POTASSIUM CHLORIDE 10 MEQ/50ML IV SOLN
10.0000 meq | INTRAVENOUS | Status: AC
  Administered 2021-08-06 (×4): 10 meq via INTRAVENOUS
  Filled 2021-08-06 (×4): qty 50

## 2021-08-06 NOTE — Consult Note (Signed)
Consultation Note Date: 08/06/2021   Patient Name: Courtney Goodman  DOB: [birth year]  MRN: [id number]  Age / Sex: 76 y.o., female  PCP: [name] (Inactive) Referring Physician: Kipp Brood, MD  Reason for Consultation: Establishing goals of care  HPI/Patient Profile: 75 y.o. female  admitted on 08/27/2021 through the ER with AMS and new Lt facial droop.  She was in hospital from 05/31/21 to 06/06/21 for AMS, septic shock with Klebsiella bacteremia, hypodense lesion in pancreatic head, and uncontrolled diabetes.  Reported to have fever at home.  Intubated in ER.  Started on ABx and pressors.  Neuro consulted in ER.  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  CT chest/abd/pelvis showed bronchiectasis with mucus plugging and basilar consolidation, improved appearance of pancreatic head, stool balls in rectum, and increasing lytic lesion in superior endplate wedge deformity of L1 with gas concerning for osteomyelitis. Found to have worsening anemia and PRBC ordered.  EEG and MRI imaging ordered.  Patient admitted for treatment stabilization.  Neuro consult note.  Blood cultures positive for k. Pneumonia, urine culture positive for greater than 100,000 gram-negative rods.   Patient currently intubated, Requiring pressors support blood pressure.  Low platelet   Gathered more pertinent history today from daughter at bedside.  She reports that her mother has had continued physical,  functional and cognitive decline over the past many months.  Patient lives at home with her daughter requiring 24/7 care, dependent for all ADLs  Family face treatment option decisions, advanced directive decisions and anticipatory care needs.     Clinical Assessment and Goals of Care:  This NP Wadie Lessen reviewed medical records, received report from team, assessed the patient and then meet at the patient's  bedside along with her daughter/ [name] to discuss diagnosis, prognosis, [place], EOL wishes disposition and options.   Concept of Palliative Care was introduced as specialized medical care for people and their families living with serious illness.  If focuses on providing relief from the symptoms and stress of a serious illness.  The goal is to improve quality of life for both the patient and the family.  Values and goals of care important to patient and family were attempted to be elicited.     A  discussion was had today regarding advanced directives.  Concepts specific to code status, artifical feeding and hydration, continued IV antibiotics and rehospitalization was had.    The difference between a aggressive medical intervention path  and a palliative comfort care path for this patient at this time was had.     At this time daughter is clear that she wishes to continue to treat the treatable, hope for improvement.     Natural trajectory and expectations at EOL were discussed.  Questions and concerns addressed.   Family encouraged to call with questions or concerns.     PMT will continue to support holistically.         No documented HPOA in [place], daughter at [facility]  tells me she is her main caregiver and decision maker.  [name] did bring in HPI documents for scanning.   I shared with daughter the MOST form seen in [place], [name] verbalizes frustration with completion of that document in the outpatient setting stating that at the time the document was completed her mother did not have medical decision-making capacity.  At this time family is making medical decisions for this patient.  There is another listed daughter in demographics.        SUMMARY OF RECOMMENDATIONS    Code Status/Advance Care Planning: Full code    Additional Recommendations (Limitations, Scope, Preferences): Full Scope Treatment Family needed time for watchful waiting) patient's current medical  situation before making decisions regarding treatment plan.  Psycho-social/Spiritual:  Desire for further Chaplaincy support:no, declined   Prognosis:  Unable to determine  Discharge Planning: To Be Determined      Primary Diagnoses: Present on Admission:  Altered mental status   I have reviewed the medical record, interviewed the patient and family, and examined the patient. The following aspects are pertinent.  Past Medical History:  Diagnosis Date   Anemia of chronic disease    Asthma    Atherosclerotic heart disease of native coronary artery without angina pectoris    CKD (chronic kidney disease)    COPD (chronic obstructive pulmonary disease) (HCC)    DM (diabetes mellitus), type 2 with renal complications (HCC)    Dysrhythmia    Elevated ferritin 01/03/2017   GAD (generalized anxiety disorder)    Hypertension    Mixed hyperlipidemia due to type 2 diabetes mellitus (HCC)    Mixed incontinence    per medical records from [facility]   Osteopenia    Personal history of noncompliance with medical treatment, presenting hazards to health    Shortness of breath dyspnea    Suicidal ideation    TB (tuberculosis), treated    age 72   Vitamin D deficiency    Social History   Socioeconomic History   Marital status: Divorced    Spouse name: Not on file   Number of children: 2   Years of education: 12   Highest education level: High school graduate  Occupational History   Occupation: Retired  Tobacco Use   Smoking status: Former   Smokeless tobacco: Never  Scientific laboratory technician Use: Never used  Substance and Sexual Activity   Alcohol use: No   Drug use: No   Sexual activity: Not Currently  Other Topics Concern   Not on file  Social History Narrative   Lives alone    Left handed   Social Determinants of Health   Financial Resource Strain: Low Risk  (01/05/2021)   Overall Financial Resource Strain (CARDIA)    Difficulty of Paying Living Expenses: Not hard at all   Food Insecurity: No Food Insecurity (06/08/2021)   Hunger Vital Sign    Worried About Running Out of Food in the Last Year: Never true    Ran Out of Food in the Last Year: Never true  Transportation Needs: No Transportation Needs (04/16/2021)   PRAPARE - Hydrologist (Medical): No    Lack of Transportation (Non-Medical): No  Physical Activity: Inactive (01/05/2021)   Exercise Vital Sign    Days of Exercise per Week: 0 days    Minutes of Exercise per Session: 0 min  Stress: No Stress Concern Present (01/05/2021)   [place]  Feeling of Stress : Only a little  Social Connections: Moderately Isolated (10/05/2019)   Social Connection and Isolation Panel [NHANES]    Frequency of Communication with Friends and Family: More than three times a week    Frequency of Social Gatherings with Friends and Family: More than three times a week    Attends Religious Services: More than 4 times per year    Active Member of [employer] or Organizations: No    Attends Music therapist: Never    Marital Status: Divorced   Family History  Problem Relation Age of Onset   Diabetes Mother    Scheduled Meds:  Chlorhexidine Gluconate Cloth  6 each Topical Daily   docusate  100 mg Per Tube BID   insulin aspart  0-15 Units Subcutaneous Q4H   mouth rinse  15 mL Mouth Rinse Q2H   pantoprazole  40 mg Intravenous Q12H   polyethylene glycol  17 g Per Tube Daily   Continuous Infusions:  sodium chloride 20 mL/hr at 08/06/21 1400   sodium chloride 100 mL/hr at 08/06/21 1400   cefTRIAXone (ROCEPHIN)  IV Stopped (08/06/21 1001)   feeding supplement (VITAL AF 1.2 CAL)     norepinephrine (LEVOPHED) Adult infusion 7 mcg/min (08/06/21 1400)   PRN Meds:.fentaNYL (SUBLIMAZE) injection, ipratropium-albuterol, midazolam, mouth rinse Medications Prior to Admission:  Prior to Admission medications   Medication Sig Start  Date End Date Taking? Authorizing Provider  albuterol (VENTOLIN HFA) 108 (90 Base) MCG/ACT inhaler Inhale 1-2 puffs into the lungs every 6 (six) hours as needed for wheezing or shortness of breath. 06/15/21  Yes [name], PA-C  aspirin 81 MG EC tablet Take 1 tablet (81 mg total) by mouth daily. 06/13/21  Yes Pahwani, Einar Grad, MD  Biotin 10000 MCG TABS Take 10,000 mcg by mouth daily.   Yes [provider]  carvedilol (COREG) 6.25 MG tablet Take 1 tablet (6.25 mg total) by mouth 2 (two) times daily with a meal. 06/06/21 08/23/2021 Yes Pahwani, Einar Grad, MD  Cholecalciferol 25 MCG (1000 UT) tablet Take 1 tablet by mouth daily. 08/11/19  Yes Henson, Vickie L, NP-C  escitalopram (LEXAPRO) 5 MG tablet Take 1 tablet (5 mg total) by mouth daily. 08/01/21  Yes Wertman, Coralee Pesa, PA-C  fenofibrate (TRICOR) 145 MG tablet TAKE ONE TABLET BY MOUTH ONCE DAILY Patient taking differently: Take 145 mg by mouth daily. 07/12/21  Yes Francis Gaines B, PA-C  folic acid (FOLVITE) 1 MG tablet TAKE ONE TABLET BY MOUTH EVERY MORNING Patient taking differently: Take 1 mg by mouth in the morning. 05/10/21  Yes [name], PA-C  Glucagon 3 MG/DOSE POWD Place 3 mg into the nose once as needed for up to 1 dose. 06/07/21  Yes [name], MD  insulin aspart (FIASP FLEXTOUCH) 100 UNIT/ML FlexTouch Pen Inject 5-7 Units into the skin 3 (three) times daily before meals. 06/07/21  Yes [name], MD  insulin degludec (TRESIBA FLEXTOUCH) 100 UNIT/ML FlexTouch Pen Inject 20 Units into the skin daily. Patient taking differently: Inject 10 Units into the skin daily. 06/07/21  Yes [name], MD  memantine (NAMENDA) 10 MG tablet Take 1 tablet (10 mg at night) for 2 weeks, then increase to 1 tablet (10 mg) twice a day Patient taking differently: Take 10 mg by mouth in the morning and at bedtime. 02/01/21  Yes Wertman, Coralee Pesa, PA-C  omeprazole (PRILOSEC) 40 MG capsule TAKE ONE CAPSULE BY MOUTH EVERY MORNING Patient  taking differently: Take 40 mg by  mouth daily. 07/12/21  Yes Francis Gaines B, PA-C  simvastatin (ZOCOR) 20 MG tablet Take 1 tablet (20 mg total) by mouth at bedtime. 04/20/21  Yes Francis Gaines B, PA-C  Alcohol Swabs (ALCOHOL PREP) PADS Use for testing Patient taking differently: 1 each by Other route 2 (two) times daily. E11.9 08/07/18   Henson, Vickie L, NP-C  Continuous Blood Gluc Sensor (FREESTYLE LIBRE 2 SENSOR) MISC 1 each by Does not apply route every 14 (fourteen) days. 06/07/21   [name], MD  glucose blood (ONETOUCH VERIO) test strip 1 each by Other route 2 (two) times daily. And lancets 2/day 10/27/20   Renato Shin, MD  Insulin Pen Needle 32G X 4 MM MISC Use 4x a day 06/07/21   [name], MD   No Known Allergies Review of Systems  Unable to perform ROS: Intubated    Physical Exam Constitutional:      Appearance: She is underweight. She is ill-appearing.     Interventions: She is intubated.  Cardiovascular:     Rate and Rhythm: Tachycardia present.  Pulmonary:     Effort: She is intubated.  Skin:    General: Skin is warm and dry.     Vital Signs: BP 139/67   Pulse 85   Temp 98.7 F (37.1 C) (Oral)   Resp 19   Ht '5\' 5"'$  (1.651 m)   Wt 60.3 kg   SpO2 100%   BMI 22.12 kg/m  Pain Scale: CPOT       SpO2: SpO2: 100 % O2 Device:SpO2: 100 % O2 Flow Rate: .   IO: Intake/output summary:  Intake/Output Summary (Last 24 hours) at 08/06/2021 1510 Last data filed at 08/06/2021 1400 Gross per 24 hour  Intake 4538.27 ml  Output 738 ml  Net 3800.27 ml    LBM: Last BM Date : 08/05/21 Baseline Weight: Weight: 60.3 kg Most recent weight: Weight: 60.3 kg     Palliative Assessment/Data:     PMT will continue to support holistically.  Discussed with Dr Lynetta Mare Signed by: Wadie Lessen, NP   Please contact Palliative Medicine Team phone at [phone] for questions and concerns.  For individual provider: See Shea Evans

## 2021-08-06 NOTE — Progress Notes (Signed)
Initial Nutrition Assessment  DOCUMENTATION CODES:   Non-severe (moderate) malnutrition in context of chronic illness  INTERVENTION:   Tube Feeding via Cortrak:  Vital AF 1.2 at 60 ml/hr This provides 108 g of protein, 1728 kcals, 1166 mL of free water  NUTRITION DIAGNOSIS:   Moderate Malnutrition related to chronic illness as evidenced by moderate fat depletion, moderate muscle depletion.  GOAL:   Patient will meet greater than or equal to 90% of their needs  MONITOR:   Vent status, Labs, Weight trends, TF tolerance  REASON FOR ASSESSMENT:   Consult, Ventilator Enteral/tube feeding initiation and management  ASSESSMENT:   76 yo female admitted with septic shock due to Klebsiella bacteremia from lumbar osteomyelitis and paraspinal abscess from prior UTI, acute respiratory failure and acute toxic encephalopathy requiring intubation. PMH includes DM, HTN, HLD, CAD, CVA, asthma, COPD, CKD 3, dementia  7/08 Admitted  Pt remains on vent support. Requiring levophed at 7 mcg/min  Unable to obtain diet and weight history from patient at this time.  Vital High Protein at 40 ml/hr started via Adult TF protocol yesterday and pt tolerating  Current wt 60.3 kg; no weight loss per weight encounters.   Labs: sodium 133 (L), magnesium 1.6 (L) Meds: ss novolog, miralax  NUTRITION - FOCUSED PHYSICAL EXAM:  Flowsheet Row Most Recent Value  Orbital Region Moderate depletion  Upper Arm Region Moderate depletion  Thoracic and Lumbar Region Moderate depletion  Buccal Region Unable to assess  Temple Region Severe depletion  Clavicle Bone Region Severe depletion  Clavicle and Acromion Bone Region Moderate depletion  Scapular Bone Region Moderate depletion  Dorsal Hand Unable to assess  Patellar Region Severe depletion  Anterior Thigh Region Severe depletion  Posterior Calf Region Severe depletion  Edema (RD Assessment) Mild  [generalized]       Diet Order:   Diet Order              Diet NPO time specified  Diet effective now                   EDUCATION NEEDS:   Not appropriate for education at this time  Skin:  Skin Assessment: Skin Integrity Issues: Skin Integrity Issues:: Stage III Stage III: coccyx  Last BM:  7/09  Height:   Ht Readings from Last 1 Encounters:  08/16/2021 '5\' 5"'$  (1.651 m)    Weight:   Wt Readings from Last 1 Encounters:  08/06/21 60.3 kg     BMI:  Body mass index is 22.12 kg/m.  Estimated Nutritional Needs:   Kcal:  1600-1800 kcals  Protein:  90-110 g  Fluid:  >/= 1.6 L   Kerman Passey MS, RDN, LDN, CNSC Registered Dietitian 3 Clinical Nutrition RD Pager and On-Call Pager Number Located in [place]

## 2021-08-06 NOTE — Progress Notes (Addendum)
NAME:  Courtney Goodman, MRN:  [id number], DOB:  July 06, 1945, LOS: 2 ADMISSION DATE:  08/01/2021, CONSULTATION DATE:  08/04/21 REFERRING MD:  Dr. Armandina Gemma, ER, CHIEF COMPLAINT:  AMS   History of Present Illness:  76 yo female former smoker sent to ER with AMS and new Lt facial droop.  She was in hospital from 05/31/21 to 06/06/21 for AMS, septic shock with Klebsiella bacteremia, hypodense lesion in pancreatic head, and uncontrolled diabetes.  Reported to have fever at home.  Intubated in ER.  Started on ABx and pressors.  Neuro consulted in ER.  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  CT chest/abd/pelvis showed bronchiectasis with mucus plugging and basilar consolidation, improved appearance of pancreatic head, stool balls in rectum, and increasing lytic lesion in superior endplate wedge deformity of L1 with gas concerning for osteomyelitis. Found to have worsening anemia and PRBC ordered.  EEG and MRI imaging ordered.  PCCM asked to arrange for admission to ICU.  Hx from chart and medical team.  Pertinent  Medical History  DM type 2, HTN, HLD, CAD, CVA, Asthma/COPD, CKD 3, Anxiety with depression, Dementia  Significant Hospital Events: Including procedures, antibiotic start and stop dates in addition to other pertinent events   7/08 Admit, neuro consulted 7/9 Blood cultures positive for K.pneumoniae 7/8 Urine Cx + for > 100,000 colonies gram negative rods  Interim History / Subjective:  Weaning well, remains on 8 mcg Levo Platelet drop to 38 K from 197 K Net + 5100 cc's Lactate remains 3.3    Objective   Blood pressure 110/63, pulse 88, temperature 98.3 F (36.8 C), temperature source Oral, resp. rate 19, height '5\' 5"'$  (1.651 m), weight 60.3 kg, SpO2 100 %.    Vent Mode: PRVC FiO2 (%):  [40 %] 40 % Set Rate:  [16 bmp] 16 bmp Vt Set:  [450 mL] 450 mL PEEP:  [5 cmH20] 5 cmH20 Pressure Support:  [10 cmH20] 10 cmH20 Plateau Pressure:  [12 cmH20-15 cmH20] 12 cmH20    Intake/Output Summary (Last 24 hours) at 08/06/2021 0735 Last data filed at 08/06/2021 0600 Gross per 24 hour  Intake 2810.9 ml  Output 558 ml  Net 2252.9 ml   Filed Weights   08/06/21 0500  Weight: 60.3 kg    Examination:  General - elderly female , not sedated, in NAD at present Eyes - pupils reactive. 4 mm and brisk reaction to light ENT - ETT in place secure and intact Cardiac - regular rate/rhythm, no murmur, no rub Chest - bilateral chest excursion, clear throughout, diminished per bases Abdomen - soft, NT, ND, decreased bowel sounds Extremities - no cyanosis, clubbing, or 1+ edema Skin - no rashes, lesions , warm dry and intact Neuro - not following commands, flicker of movement to pain, no sedation     Ancillary tests personally reviewed:   BCID positive again for K.pneumoniae T Max 98.5 Labs WBC 33.1/ HGB 8.9/ Platelets dropped to 38,000 from 197,000 overnight >> No heparin Na 133/ K 3.2/ CO2 14/ BUN 58 from 53/Creatinine 2.07 from 2.70/ Calcium corrects to 9.4 ( Albumin < 1.5) Mag 1.6 Random vanc 13 ug/mL>> Discontinued   Net + 4445 with  558 cc Urine output  Currently weaning on 40% and 5/5, RR 18 and sats of 100%  Remains on levo at 8 mcg/min>> NS at 100 cc per hour  MTI Brain 7/10 Cervical spine:   1. No acute abnormality.  No osteomyelitis discitis. 2. Multilevel degenerative changes.  Severe bilateral neuroforaminal stenosis at C3-C4.   Thoracic and lumbar spine:   1. 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 2. Small anterior epidural collection at T12 with left ventral epidural extension superiorly up to T9 suspicious for phlegmon and/or abscess. It is difficult to distinguish between the two without intravenous contrast. 3. 4.4 cm paraspinous fluid collection on the right at T11 and T12, suspicious for abscess. 4. Multilevel  thoracolumbar spondylosis as described above. Moderate to severe spinal canal and severe right neuroforaminal stenosis at L3-L4 Assessment & Plan:   Critically ill due to Acute hypoxic respiratory failure with compromised airway. Hx of COPD/asthma and bronchiectasis. - wean as able - Daily WUA and SBT - goal SpO2 > 92% - f/u CXR, in am 7/11, ABG prn - prn BDs  AMS with Lt facial droop. Hx of CVA, dementia, Anxiety, Depression. - neuro consulted - f/u EEG,  - MRI brain done 7/9>> results noted above  Critically ill due to Septic shock due to recurrent K.pneumoniae infection.  Concern for osteo WBC slight decrease to 33 from 37 on 7/9 - unclear source >> bacterial lower lobar pneumonia, UTI, and/or L1 spine abscess/ T 9  suspicious for phlegmon and/or abscess. Abscess would be a likely candidate for recurrent bacteremia.  - continue ABX>> will change to Rocephin at meningitis dosing as platelet drop overnight  - Follow micro/ culture as is clinically indicated - pressors to keep MAP > 65 - Repeat imaging per neurosurgery - Trend lactate until clear - Will need 6 weeks abx therapy>> consider PICC when creatinine allows.    Stool impaction. - disimpacted in ER  Anemia of critical illness, possible GI bleed due to low HB and CG on NGT aspirate - no obvious source of bleeding - f/u CBC daily  Thrombocytopenia Platelet drop from 192 to 38 K No active bleeding No Heparin>> ? Reactive 2/2 sepsis Plan Trend CBC and platelets daily  Bleeding precautions   AKI from shock. CKD 3b. - baseline creatinine 1.59 from 08/01/21 - continue IV fluids, bolus - f/u BMET, monitor urine outpt  Severe protein calorie malnutrition. - Continue tube feeds  Best Practice (right click and "Reselect all SmartList Selections" daily)   Diet/type: NPO - feeds  DVT prophylaxis: SCD heparin GI prophylaxis: PPI Lines: N/A Foley:  N/A Code Status:  full code Last date of multidisciplinary  goals of care discussion '[x]'$   CRITICAL CARE Performed by: Magdalen Spatz   Total critical care time: 40  minutes  Critical care time was exclusive of separately billable procedures and treating other patients.  Critical care was necessary to treat or prevent imminent or life-threatening deterioration.  Critical care was time spent personally by me on the following activities: development of treatment plan with patient and/or surrogate as well as nursing, discussions with consultants, evaluation of patient's response to treatment, examination of patient, obtaining history from patient or surrogate, ordering and performing treatments and interventions, ordering and review of laboratory studies, ordering and review of radiographic studies, pulse oximetry, re-evaluation of patient's condition and participation in multidisciplinary rounds.  Daughter updated at bedside 7/10 am   Magdalen Spatz, MSN, AGACNP-BC [place] for personal pager PCCM on call pager [phone]  08/06/2021 7:36 AM

## 2021-08-06 NOTE — Progress Notes (Addendum)
LTM hook up to non MRI leads. Patient monitored by [facility].

## 2021-08-06 NOTE — Progress Notes (Signed)
Brief HPI:  Courtney Goodman is a 76 year old female with a past medical history for anemia, asthma, CAD, CKD, COPD, DM type II, dysrhythmia, AAA, dementia elevated ferritin, dementia, septic shock secondary to Klebsiella in May 2023.  She was intubated in the ED due to GCS of 4 where she also was hyperglycemic, anemic, septic, and hypotensive.  Subjective: Seen in room, off sedation.  Still requiring blood pressure support with norepinephrine.  She is weaning at 40% FiO2. Daughter is at the bedside, discussed MRI and current lab results.  Her daughter explained that after her previous hospitalization she had a steady decline in her mental status and was requiring constant care.  Exam: Vitals:   08/06/21 0743 08/06/21 0800  BP: 111/64 120/70  Pulse: 86 90  Resp: 18 (!) 21  Temp:    SpO2: 100% 100%   Gen: In bed, NAD Resp: non-labored breathing, no acute distress, mechanically ventilated Abd: soft, nt  Neuro: Courtney: Does not respond to verbal stimuli, does not follow commands, will grimace to noxious stimuli.  She does resist forced eye opening. CN: Pupils 4 mm bilaterally and brisk, resistance to forced eye opening, corneals present, cough and gag present, head is midline Motor: No purposeful movement, some resistance to gravity noted in bilateral upper extremities Sensory: Withdraws to pain in all extremities  ATTENDING EXAM No sedation Intubated No spont movements Resists eye opening To nox stim - ?localization from LUE, minimal movement in RUE. Triple flexion LLE, minimal movement RLE.   Pertinent Labs: WBC 37.1 -> 33.1 PLT 197 ->38 Albumin- <1.5 K 3.2 Cr 2.70-> 2.07  Imaging: MRI brain- A few small acute infarcts in left frontoparietal cortex. Remote right caudate and left thalamus lacunar infarcts. MRI C-spine- Multilevel degenerative changes. Severe bilateral neuroforaminal stenosis at C3-C4. MRI T-spine/L-spine- Findings consistent with osteomyelitis discitis at T12-L1  with pathologic fracture of the L1 vertebral body and bilateral paravertebral abscesses. Small anterior epidural collection at T12 with left ventral epidural extension superiorly up to T9 suspicious for phlegmon and/or abscess. It is difficult to distinguish between the two without intravenous contrast.4.4 cm paraspinous fluid collection on the right at T11 and T12, suspicious for abscess. Multilevel thoracolumbar spondylosis as described above. Moderate to severe spinal canal and severe right neuroforaminal stenosis at L3-L4.  Impression:  76 year old female with an extensive past medical history presenting with altered mental status requiring intubation and subsequently found to have hyperglycemia, anemia with hypotension, leukocytosis (WBC 38) and lactic acidosis (6.9).  Spinal imaging shows osteomyelitis from T12-L1 and suspicion for an abscess at T9 and T11 and T12. Antibiotics were switched to Rocephin per CCM due to significant drop in platelets (38).  Leukocytosis has improved to 33.1 and kidney function has improved with Cr at 2.07.  Neuro exam is still poor at this time. With her advanced age and debility along with current systemic issues, prognosis for a meaningful recovery from a neurological standpoint remains guarded to poor. Given Kleb bacteremia, Dr. Michelle Piper thought of CNS infection is also very possible and hence the prognosis as above.  IMP Multifactorial toxic metabolic encephalopathy Possible CNS infection in the setting of Kleb bacteremia and osteo Multiple other comorbidities Continuing poor neurological exam   Recommendations: -Continue antibiotics and hydration per CCM - May need picc line for long term abx - cEEG in place - Agree with PMT consult.  Plan of care directed by Dr. Rory Percy. MD to update note as needed.   Courtney Ores, DNP, FNP-BC [facility] Pager: [phone]  Attending Neurohospitalist Addendum Patient seen and examined with  APP/Resident. Agree with the history and physical as documented above. Agree with the plan as documented, which I helped formulate. I have independently reviewed the chart, obtained history, review of systems and examined the patient.I have personally reviewed pertinent head/neck/spine imaging (CT/MRI). Please feel free to call with any questions.  -- Amie Portland, MD Neurologist [facility] Pager: [phone]   CRITICAL CARE ATTESTATION Performed by: Amie Portland, MD Total critical care time: 31 minutes Critical care time was exclusive of separately billable procedures and treating other patients and/or supervising APPs/Residents/Students Critical care was necessary to treat or prevent imminent or life-threatening deterioration due to multifactorial toxic metabolic encephalopathy  This patient is critically ill and at significant risk for neurological worsening and/or death and care requires constant monitoring. Critical care was time spent personally by me on the following activities: development of treatment plan with patient and/or surrogate as well as nursing, discussions with consultants, evaluation of patient's response to treatment, examination of patient, obtaining history from patient or surrogate, ordering and performing treatments and interventions, ordering and review of laboratory studies, ordering and review of radiographic studies, pulse oximetry, re-evaluation of patient's condition, participation in multidisciplinary rounds and medical decision making of high complexity in the care of this patient.

## 2021-08-06 NOTE — Progress Notes (Signed)
Pharmacy Antibiotic Note  Courtney Goodman is a 76 y.o. female admitted on 08/03/2021 presenting with AMS, concern for sepsis.  Pharmacy has been consulted for vancomycin and cefepime dosing. Given possible spinal abscess, pharmacy is consulted to adjust dosing for meningitis coverage. BCID showed Klebsiella pneumoniae in 2/4 bottles. Pt experiencing septic shock requiring NE and vent support. Lactic acid has decreased to 4.9 with pressors. WBC is 37.1 > 33 and afebrile. AKI present, but beginning to improve.  Vancomycin random level 13 this AM, last dose 7/8 2300 PM.  Plan: Continue Cefepime 2g IV every 24 hours Vancomycin stopped by [facility] 7/9 Monitor renal function, Cx and clinical progression to narrow   Height: '5\' 5"'$  (165.1 cm) Weight: 60.3 kg (132 lb 15 oz) IBW/kg (Calculated) : 57  Temp (24hrs), Avg:98.3 F (36.8 C), Min:98 F (36.7 C), Max:98.5 F (36.9 C)  Recent Labs  Lab 08/01/21 0904 07/29/2021 1159 08/24/2021 1201 08/12/2021 1202 08/12/2021 1729 08/21/2021 2207 08/05/21 0502 08/05/21 0809 08/06/21 0430  WBC 13.1* 31.7*  --   --   --  38.1* 37.1*  --  33.1*  CREATININE 1.59* 3.31*  --  3.70*  --   --  2.70*  --  2.07*  LATICACIDVEN  --   --  5.9*  --  6.9*  --  5.3* 4.9*  --   VANCORANDOM  --   --   --   --   --   --   --   --  13     Estimated Creatinine Clearance: 20.8 mL/min (A) (by C-G formula based on SCr of 2.07 mg/dL (H)).    No Known Allergies  7/8 BCx > Klebsiella 7/8 UCx >100,000 col GNR  Cefepime 7/8>> Vanc 7/8>>7/9  Thank you for allowing pharmacy to participate in this patient's care.  [place] Crane, Roylene Reason, BCCP Clinical Pharmacist  08/06/2021 7:43 AM   [facility] pharmacy phone numbers are listed on [email or web address]

## 2021-08-06 NOTE — Procedures (Signed)
Cortrak ? ?Person Inserting Tube:  Philomina Leon C, RD ?Tube Type:  Cortrak - 43 inches ?Tube Size:  10 ?Tube Location:  Left nare ?Secured by: Bridle ?Technique Used to Measure Tube Placement:  Marking at nare/corner of mouth ?Cortrak Secured At:  65 cm ? ?Cortrak Tube Team Note: ? ?Consult received to place a Cortrak feeding tube.  ? ?X-ray is required, abdominal x-ray has been ordered by the Cortrak team. Please confirm tube placement before using the Cortrak tube.  ? ?If the tube becomes dislodged please keep the tube and contact the Cortrak team at [email or web address] ([email or web address]) for replacement.  ?If after hours and replacement cannot be delayed, place a NG tube and confirm placement with an abdominal x-ray.  ? ? ?Kathya Wilz P., RD, LDN, CNSC ?See AMiON for contact information  ? ? ?

## 2021-08-06 NOTE — Progress Notes (Signed)
eLink Physician-Brief Progress Note Patient Name: Courtney Goodman DOB: March 30, 1945 MRN: [id number]   Date of Service  08/06/2021  HPI/Events of Note  Patient blood sugar running high (200's) despite aggressive Insulin coverage regimen for he creatinine clearance, she received 8 units of Humalog at approximately midnight and is on Q 4 hourly CBG with SSI coverage.  eICU Interventions  Will check CBG at 2:00 AM and determine from there if additional modifications to SSI regimen are warranted.        Kerry Kass Virgle Arth 08/06/2021, 11:59 PM

## 2021-08-06 NOTE — Progress Notes (Signed)
[place] Progress Note Patient Name: Courtney Goodman DOB: 04-08-1945 MRN: [id number]   Date of Service  08/06/2021  HPI/Events of Note  K 3.2, Mg 1.6, crea 2.07.  eICU Interventions  Replete K and Mg. MgSO4 2g IV and 70mq KCL x 4 ordered.     Intervention Category Intermediate Interventions: Electrolyte abnormality - evaluation and management  VElsie Lincoln7/10/2021, 6:12 AM

## 2021-08-07 ENCOUNTER — Ambulatory Visit: Payer: Medicare HMO | Admitting: Gastroenterology

## 2021-08-07 ENCOUNTER — Inpatient Hospital Stay (HOSPITAL_COMMUNITY): Payer: Medicare HMO

## 2021-08-07 DIAGNOSIS — R569 Unspecified convulsions: Secondary | ICD-10-CM | POA: Diagnosis not present

## 2021-08-07 DIAGNOSIS — A419 Sepsis, unspecified organism: Secondary | ICD-10-CM | POA: Diagnosis not present

## 2021-08-07 DIAGNOSIS — E440 Moderate protein-calorie malnutrition: Secondary | ICD-10-CM | POA: Insufficient documentation

## 2021-08-07 DIAGNOSIS — R4182 Altered mental status, unspecified: Secondary | ICD-10-CM | POA: Diagnosis not present

## 2021-08-07 LAB — GLUCOSE, CAPILLARY
Glucose-Capillary: 320 mg/dL — ABNORMAL HIGH (ref 70–99)
Glucose-Capillary: 334 mg/dL — ABNORMAL HIGH (ref 70–99)
Glucose-Capillary: 342 mg/dL — ABNORMAL HIGH (ref 70–99)
Glucose-Capillary: 376 mg/dL — ABNORMAL HIGH (ref 70–99)
Glucose-Capillary: 304 mg/dL — ABNORMAL HIGH (ref 70–99)
Glucose-Capillary: 278 mg/dL — ABNORMAL HIGH (ref 70–99)
Glucose-Capillary: 251 mg/dL — ABNORMAL HIGH (ref 70–99)
Glucose-Capillary: 219 mg/dL — ABNORMAL HIGH (ref 70–99)
Glucose-Capillary: 197 mg/dL — ABNORMAL HIGH (ref 70–99)
Glucose-Capillary: 201 mg/dL — ABNORMAL HIGH (ref 70–99)
Glucose-Capillary: 180 mg/dL — ABNORMAL HIGH (ref 70–99)
Glucose-Capillary: 174 mg/dL — ABNORMAL HIGH (ref 70–99)
Glucose-Capillary: 178 mg/dL — ABNORMAL HIGH (ref 70–99)
Glucose-Capillary: 162 mg/dL — ABNORMAL HIGH (ref 70–99)
Glucose-Capillary: 137 mg/dL — ABNORMAL HIGH (ref 70–99)
Glucose-Capillary: 136 mg/dL — ABNORMAL HIGH (ref 70–99)
Glucose-Capillary: 136 mg/dL — ABNORMAL HIGH (ref 70–99)
Glucose-Capillary: 155 mg/dL — ABNORMAL HIGH (ref 70–99)

## 2021-08-07 LAB — DIC (DISSEMINATED INTRAVASCULAR COAGULATION)PANEL
D-Dimer, Quant: 5.47 ug/mL-FEU — ABNORMAL HIGH (ref 0.00–0.50)
Fibrinogen: 533 mg/dL — ABNORMAL HIGH (ref 210–475)
INR: 1.6 — ABNORMAL HIGH (ref 0.8–1.2)
Platelets: 15 10*3/uL — CL (ref 150–400)
Prothrombin Time: 19.2 seconds — ABNORMAL HIGH (ref 11.4–15.2)
Smear Review: NONE SEEN
aPTT: 29 seconds (ref 24–36)

## 2021-08-07 LAB — CBC
HCT: 24.1 % — ABNORMAL LOW (ref 36.0–46.0)
HCT: 22.7 % — ABNORMAL LOW (ref 36.0–46.0)
Hemoglobin: 8 g/dL — ABNORMAL LOW (ref 12.0–15.0)
Hemoglobin: 7.7 g/dL — ABNORMAL LOW (ref 12.0–15.0)
MCH: 29.1 pg (ref 26.0–34.0)
MCH: 28.7 pg (ref 26.0–34.0)
MCHC: 33.2 g/dL (ref 30.0–36.0)
MCHC: 33.9 g/dL (ref 30.0–36.0)
MCV: 87.6 fL (ref 80.0–100.0)
MCV: 84.7 fL (ref 80.0–100.0)
Platelets: 15 10*3/uL — CL (ref 150–400)
Platelets: 10 10*3/uL — CL (ref 150–400)
RBC: 2.75 MIL/uL — ABNORMAL LOW (ref 3.87–5.11)
RBC: 2.68 MIL/uL — ABNORMAL LOW (ref 3.87–5.11)
RDW: 18.3 % — ABNORMAL HIGH (ref 11.5–15.5)
RDW: 18.2 % — ABNORMAL HIGH (ref 11.5–15.5)
WBC: 18.6 10*3/uL — ABNORMAL HIGH (ref 4.0–10.5)
WBC: 25.4 10*3/uL — ABNORMAL HIGH (ref 4.0–10.5)
nRBC: 0.2 % (ref 0.0–0.2)
nRBC: 0.1 % (ref 0.0–0.2)

## 2021-08-07 LAB — CULTURE, BLOOD (ROUTINE X 2)

## 2021-08-07 LAB — CBC WITH DIFFERENTIAL/PLATELET
Abs Immature Granulocytes: 1.29 10*3/uL — ABNORMAL HIGH (ref 0.00–0.07)
Basophils Absolute: 0.1 10*3/uL (ref 0.0–0.1)
Basophils Relative: 0 %
Eosinophils Absolute: 0 10*3/uL (ref 0.0–0.5)
Eosinophils Relative: 0 %
HCT: 25 % — ABNORMAL LOW (ref 36.0–46.0)
Hemoglobin: 8.5 g/dL — ABNORMAL LOW (ref 12.0–15.0)
Immature Granulocytes: 4 %
Lymphocytes Relative: 3 %
Lymphs Abs: 0.9 10*3/uL (ref 0.7–4.0)
MCH: 29.2 pg (ref 26.0–34.0)
MCHC: 34 g/dL (ref 30.0–36.0)
MCV: 85.9 fL (ref 80.0–100.0)
Monocytes Absolute: 1.3 10*3/uL — ABNORMAL HIGH (ref 0.1–1.0)
Monocytes Relative: 4 %
Neutro Abs: 28.8 10*3/uL — ABNORMAL HIGH (ref 1.7–7.7)
Neutrophils Relative %: 89 %
Platelets: 8 10*3/uL — CL (ref 150–400)
RBC: 2.91 MIL/uL — ABNORMAL LOW (ref 3.87–5.11)
RDW: 18.3 % — ABNORMAL HIGH (ref 11.5–15.5)
Smear Review: NORMAL
WBC: 32.5 10*3/uL — ABNORMAL HIGH (ref 4.0–10.5)
nRBC: 0.1 % (ref 0.0–0.2)

## 2021-08-07 LAB — COMPREHENSIVE METABOLIC PANEL
ALT: 33 U/L (ref 0–44)
AST: 53 U/L — ABNORMAL HIGH (ref 15–41)
Albumin: <1.5 g/dL — ABNORMAL LOW (ref 3.5–5.0)
Alkaline Phosphatase: 175 U/L — ABNORMAL HIGH (ref 38–126)
Anion gap: 7 (ref 5–15)
BUN: 65 mg/dL — ABNORMAL HIGH (ref 8–23)
CO2: 14 mmol/L — ABNORMAL LOW (ref 22–32)
Calcium: 7.4 mg/dL — ABNORMAL LOW (ref 8.9–10.3)
Chloride: 112 mmol/L — ABNORMAL HIGH (ref 98–111)
Creatinine, Ser: 1.63 mg/dL — ABNORMAL HIGH (ref 0.44–1.00)
GFR, Estimated: 32 mL/min — ABNORMAL LOW (ref >60)
Glucose, Bld: 364 mg/dL — ABNORMAL HIGH (ref 70–99)
Potassium: 3.2 mmol/L — ABNORMAL LOW (ref 3.5–5.1)
Sodium: 133 mmol/L — ABNORMAL LOW (ref 135–145)
Total Bilirubin: 3.5 mg/dL — ABNORMAL HIGH (ref 0.3–1.2)
Total Protein: 4.9 g/dL — ABNORMAL LOW (ref 6.5–8.1)

## 2021-08-07 LAB — BASIC METABOLIC PANEL
Anion gap: 10 (ref 5–15)
BUN: 65 mg/dL — ABNORMAL HIGH (ref 8–23)
CO2: 13 mmol/L — ABNORMAL LOW (ref 22–32)
Calcium: 7.7 mg/dL — ABNORMAL LOW (ref 8.9–10.3)
Chloride: 112 mmol/L — ABNORMAL HIGH (ref 98–111)
Creatinine, Ser: 1.58 mg/dL — ABNORMAL HIGH (ref 0.44–1.00)
GFR, Estimated: 34 mL/min — ABNORMAL LOW (ref >60)
Glucose, Bld: 325 mg/dL — ABNORMAL HIGH (ref 70–99)
Potassium: 5 mmol/L (ref 3.5–5.1)
Sodium: 135 mmol/L (ref 135–145)

## 2021-08-07 LAB — MAGNESIUM: Magnesium: 2 mg/dL (ref 1.7–2.4)

## 2021-08-07 LAB — LACTIC ACID, PLASMA: Lactic Acid, Venous: 2.4 mmol/L (ref 0.5–1.9)

## 2021-08-07 LAB — BRAIN NATRIURETIC PEPTIDE: B Natriuretic Peptide: 223 pg/mL — ABNORMAL HIGH (ref 0.0–100.0)

## 2021-08-07 MED ORDER — INSULIN REGULAR(HUMAN) IN NACL 100-0.9 UT/100ML-% IV SOLN
INTRAVENOUS | Status: AC
  Administered 2021-08-07: 9.5 [IU]/h via INTRAVENOUS
  Administered 2021-08-07: 1.8 [IU]/h via INTRAVENOUS
  Filled 2021-08-07 (×2): qty 100

## 2021-08-07 MED ORDER — POTASSIUM CHLORIDE 20 MEQ PO PACK
60.0000 meq | PACK | Freq: Once | ORAL | Status: AC
Start: 2021-08-07 — End: 2021-08-07
  Administered 2021-08-07: 60 meq
  Filled 2021-08-07: qty 3

## 2021-08-07 MED ORDER — POTASSIUM CHLORIDE 10 MEQ/50ML IV SOLN
10.0000 meq | INTRAVENOUS | Status: AC
  Administered 2021-08-07 (×2): 10 meq via INTRAVENOUS
  Filled 2021-08-07 (×2): qty 50

## 2021-08-07 MED ORDER — DEXTROSE 50 % IV SOLN
0.0000 mL | INTRAVENOUS | Status: DC | PRN
  Administered 2021-08-13: 50 mL via INTRAVENOUS
  Filled 2021-08-07 (×2): qty 50

## 2021-08-07 MED ORDER — POLYETHYLENE GLYCOL 3350 17 G PO PACK: 17.0000 g | PACK | Freq: Every day | ORAL | Status: DC | PRN

## 2021-08-07 MED ORDER — ALBUMIN HUMAN 25 % IV SOLN
25.0000 g | Freq: Once | INTRAVENOUS | Status: AC
Start: 2021-08-07 — End: 2021-08-07
  Administered 2021-08-07: 25 g via INTRAVENOUS
  Filled 2021-08-07: qty 100

## 2021-08-07 MED ORDER — DOCUSATE SODIUM 50 MG/5ML PO LIQD: 100.0000 mg | Freq: Two times a day (BID) | ORAL | Status: DC | PRN

## 2021-08-07 MED ORDER — POTASSIUM CHLORIDE 20 MEQ PO PACK
20.0000 meq | PACK | ORAL | Status: AC
  Administered 2021-08-07 (×2): 20 meq
  Filled 2021-08-07 (×2): qty 1

## 2021-08-07 MED ORDER — SODIUM CHLORIDE 0.9% IV SOLUTION
Freq: Once | INTRAVENOUS | Status: AC
Start: 2021-08-07 — End: 2021-08-07

## 2021-08-07 MED ORDER — INSULIN ASPART 100 UNIT/ML IJ SOLN
8.0000 [IU] | Freq: Once | INTRAMUSCULAR | Status: AC
  Administered 2021-08-07: 8 [IU] via SUBCUTANEOUS

## 2021-08-07 MED ORDER — POTASSIUM CHLORIDE 10 MEQ/50ML IV SOLN: 10.0000 meq | INTRAVENOUS | Status: DC

## 2021-08-07 MED ORDER — SODIUM CHLORIDE 0.9% IV SOLUTION: Freq: Once | INTRAVENOUS | Status: DC

## 2021-08-07 NOTE — Progress Notes (Signed)
[place] Progress Note Patient Name: Courtney Goodman DOB: 1945-08-07 MRN: [id number]   Date of Service  08/07/2021  HPI/Events of Note  Platelets 8 K, K+ 3.2, albumin < 1.5, Cr 1.63  eICU Interventions  Transfuse one unit of platelets, replete K+ per protocol,  25 gm of 25 % albumin iv bolus x 1.        Kerry Kass Benjamine Strout 08/07/2021, 5:41 AM

## 2021-08-07 NOTE — Progress Notes (Signed)
Brief HPI:  Courtney Goodman is a 76 year old female with a past medical history for anemia, asthma, CAD, CKD, COPD, DM type II, dysrhythmia, AAA, dementia elevated ferritin, dementia, septic shock secondary to Klebsiella in May 2023.  She was intubated in the ED on 7/8 due to GCS of 4 where she also was hyperglycemic, anemic, septic, and hypotensive.  Subjective: Seen in room, off sedation.  Still requiring blood pressure support with norepinephrine.   CCM at the bedside with RN, patient is receiving albumin. PLT decreased this morning from 38-> 8. Transfusion ordered per CCM. Palliative care consult placed.  LTM in place.  Exam: Vitals:   08/07/21 0715 08/07/21 0723  BP:  (!) 124/50  Pulse: 80 81  Resp: (!) 22 (!) 22  Temp: (!) 96.9 F (36.1 C)   SpO2: 100% 100%   Gen: In bed, NAD Resp: non-labored breathing, no acute distress, mechanically ventilated Abd: soft, nt  Neuro: Courtney: Does not respond to verbal stimuli, does not follow commands, will grimace to noxious stimuli.  She does resist forced eye opening. CN: Pupils 3 mm bilaterally and brisk, resistance to forced eye opening, corneals present, cough and gag present, head is midline Motor: No purposeful movement, some resistance to gravity noted in bilateral upper extremities Sensory: minimally withdraws to pain in all extremities, left more than right   Pertinent Labs: WBC 37.1 -> 33.1-> 32.5 PLT 197 ->38 -> 8 Albumin- <1.5 Lactic Acid- 2.4 K 3.2 Cr 2.70-> 2.07-> 1.63  Imaging: MRI brain- A few small acute infarcts in left frontoparietal cortex. Remote right caudate and left thalamus lacunar infarcts. MRI C-spine- Multilevel degenerative changes. Severe bilateral neuroforaminal stenosis at C3-C4. MRI T-spine/L-spine- Findings consistent with osteomyelitis discitis at T12-L1 with pathologic fracture of the L1 vertebral body and bilateral paravertebral abscesses. Small anterior epidural collection at T12 with left ventral  epidural extension superiorly up to T9 suspicious for phlegmon and/or abscess. It is difficult to distinguish between the two without intravenous contrast.4.4 cm paraspinous fluid collection on the right at T11 and T12, suspicious for abscess. Multilevel thoracolumbar spondylosis as described above. Moderate to severe spinal canal and severe right neuroforaminal stenosis at L3-L4.  Overnight EEG with generalized periodic discharges with triphasic morphology, more commonly from toxic metabolic derangements rather than seizures.  Impression:  76 year old female with an extensive past medical history presenting with altered mental status requiring intubation and subsequently found to have hyperglycemia, anemia with hypotension, leukocytosis (WBC 38) and lactic acidosis (6.9).  Spinal imaging shows osteomyelitis from T12-L1 and suspicion for an abscess at T9 and T11 and T12. Antibiotics were switched to Rocephin per CCM due to significant drop in platelets (38 -> 8).  Leukocytosis has improved to 32.5, lactic acid at 2.4, and kidney function has improved with Cr at 1.63.  With her advanced age and debility along with current systemic issues, prognosis for a meaningful recovery from a neurological standpoint remains guarded to poor. Given Kleb bacteremia, Dr. Michelle Piper thought of CNS infection is also very possible and hence the prognosis as above.  She might have a prolonged course for return to her baseline mentation given her advanced age and comorbidities irrespective of what current systemic infections and derangements she has.  IMP Multifactorial toxic metabolic encephalopathy with septic shock Possible CNS infection in the setting of Kleb bacteremia and osteo Multiple other comorbidities Continuing poor neurological exam   Recommendations: -Continue antibiotics and hydration per CCM - May need picc line for long term abx - cEEG in place-continue  for 1 more day. - Agree with PMT  consult.  Plan of care directed by Dr. Rory Percy. MD to update note as needed.   Janine Ores, DNP, FNP-BC [facility] Pager: [phone]   Attending Neurohospitalist Addendum Patient seen and examined with APP/Resident. Agree with the history and physical as documented above. Agree with the plan as documented, which I helped formulate. I have independently reviewed the chart, obtained history, review of systems and examined the patient.I have personally reviewed pertinent head/neck/spine imaging (CT/MRI). Discussed w/ Dr Lynetta Mare Please feel free to call with any questions.  -- Amie Portland, MD Neurologist [facility] Pager: [phone]

## 2021-08-07 NOTE — Progress Notes (Signed)
Pt placed back on full ventilator support for night rest.

## 2021-08-07 NOTE — Progress Notes (Signed)
eLink Physician-Brief Progress Note Patient Name: Zari Cly Euceda DOB: [birth year] MRN: [id number]   Date of Service  08/07/2021  HPI/Events of Note  Thrombocytopenia - Platelets  = 10.  eICU Interventions  Will transfuse 1 unit single donor platelets.      Intervention Category Major Interventions: Other:  Lysle Dingwall 08/07/2021, 10:12 PM

## 2021-08-07 NOTE — Progress Notes (Signed)
[place] Progress Note Patient Name: Courtney Goodman DOB: February 26, 1945 MRN: [id number]   Date of Service  08/07/2021  HPI/Events of Note  Blood sugar is  320 mg / dl at 2:00 AM.  eICU Interventions  Novolog 8 units SQ x 1 now ordered.        Kerry Kass Ashleynicole Mcclees 08/07/2021, 3:14 AM

## 2021-08-07 NOTE — Progress Notes (Addendum)
NAME:  Courtney Goodman, MRN:  [id number], DOB:  September 04, 1945, LOS: 3 ADMISSION DATE:  08/01/2021, CONSULTATION DATE:  08/04/21 REFERRING MD:  Dr. Armandina Gemma, ER, CHIEF COMPLAINT:  AMS   History of Present Illness:  76 yo female former smoker sent to ER with AMS and new Lt facial droop.  She was in hospital from 05/31/21 to 06/06/21 for AMS, septic shock with Klebsiella bacteremia, hypodense lesion in pancreatic head, and uncontrolled diabetes.  Reported to have fever at home.  Intubated in ER.  Started on ABx and pressors.  Neuro consulted in ER.  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  CT chest/abd/pelvis showed bronchiectasis with mucus plugging and basilar consolidation, improved appearance of pancreatic head, stool balls in rectum, and increasing lytic lesion in superior endplate wedge deformity of L1 with gas concerning for osteomyelitis. Found to have worsening anemia and PRBC ordered.  EEG and MRI imaging ordered.  PCCM asked to arrange for admission to ICU.  Hx from chart and medical team.  Pertinent  Medical History  DM type 2, HTN, HLD, CAD, CVA, Asthma/COPD, CKD 3, Anxiety with depression, Dementia  Significant Hospital Events: Including procedures, antibiotic start and stop dates in addition to other pertinent events   7/08 Admit, neuro consulted 7/9 Blood cultures positive for K.pneumoniae 7/8 Urine Cx + for > 100,000 colonies gram negative rods  Interim History / Subjective:  Overnight glucose >300s not responding to SSI; started on insulin gtt Platelets dropped to 8 being transfused platelets  On 1 mcg levo; will stop On PSV on vent 5/5 doing well Mental status remains the same; some response to painful stimuli   Objective   Blood pressure 120/64, pulse 79, temperature (!) 97.4 F (36.3 C), temperature source Axillary, resp. rate (!) 21, height '5\' 5"'$  (1.651 m), weight 64.6 kg, SpO2 100 %.    Vent Mode: PRVC FiO2 (%):  [40 %] 40 % Set Rate:  [16 bmp] 16 bmp Vt  Set:  [450 mL] 450 mL PEEP:  [5 cmH20] 5 cmH20 Pressure Support:  [5 cmH20] 5 cmH20 Plateau Pressure:  [14 cmH20] 14 cmH20   Intake/Output Summary (Last 24 hours) at 08/07/2021 7371 Last data filed at 08/07/2021 0600 Gross per 24 hour  Intake 4947.92 ml  Output 1400 ml  Net 3547.92 ml    Filed Weights   08/06/21 0500 08/07/21 0500  Weight: 60.3 kg 64.6 kg    Examination:  General:  critically ill appearing on mech vent HEENT: MM pink/moist; ETT/EEG in place Neuro: minimal withdrawal to pain > left side; PERRL; not following commands CV: s1s2, RRR, no m/r/g PULM:  dim clear BS bilaterally; on mech vent PRVC GI: soft, bsx4 active  Extremities: warm/dry, trace edema; pressure sore on bottom with bandage in place Skin: no rashes or lesions appreciated  Ancillary tests personally reviewed:   MTI Brain 7/10 Cervical spine:   1. No acute abnormality.  No osteomyelitis discitis. 2. Multilevel degenerative changes. Severe bilateral neuroforaminal stenosis at C3-C4.   Thoracic and lumbar spine:   1. 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 2. Small anterior epidural collection at T12 with left ventral epidural extension superiorly up to T9 suspicious for phlegmon and/or abscess. It is difficult to distinguish between the two without intravenous contrast. 3. 4.4 cm paraspinous fluid collection on the right at T11 and T12, suspicious for abscess. 4. Multilevel thoracolumbar spondylosis as described above.  Moderate to severe spinal canal and severe right neuroforaminal stenosis at L3-L4 Assessment & Plan:   Acute hypoxic respiratory failure with compromised airway Hx of COPD/asthma and bronchiectasis. P: -continue PSV trials during day as tolerated and rest on PRVC at night -Wean PEEP/FiO2 for SpO2 >92% -VAP bundle in place -Daily SAT and SBT -PAD protocol in  place -Follow intermittent CXR and ABG PRN -prn duoneb for wheezing  AMS with Lt facial droop. Hx of CVA, dementia, Anxiety, Depression. -MRI 7/9 no significant findings P: -neuro following appreciate recs -consider stopping EEG today per neuro -limit sedating meds  Septic shock due to recurrent K.pneumoniae infection.  Concern for osteo - unclear source >> bacterial lower lobar pneumonia, UTI, and/or L1 spine abscess/ T 9  suspicious for phlegmon and/or abscess. Abscess would be a likely candidate for recurrent bacteremia.  P: -continue rocephin -follow cultures -weaned off levo today; map goal >65 -continue IV fluids; trend LA -repeat imaging per NSG -will need 6 weeks abx; PICC line when creatinine allows -trend wbc/fever curve  Hyperglycemia P: -started on insulin gtt overnight -cbg monitoring -transition to SSI when able  Hypokalemia P: -K being repleted -repeat bmp later today -trend and replete electrolytes as needed  Stool impaction. - disimpacted in ER P: -bowel mvt yesterday -BR prn  Thrombocytopenia: no heparin; likely sepsis related Anemia of critical illness, possible GI bleed due to low HB and CG on NGT aspirate P: -continue platelet transfusion this am -send dic panel -trend CBC and transfuse as needed  AKI from shock. CKD 3b. P: -Trend BMP / urinary output -Replace electrolytes as indicated -Avoid nephrotoxic agents, ensure adequate renal perfusion  Severe protein calorie malnutrition. P: - Continue TF  Pressure injury Stage 3 of Sacrum, present on admission  P: -WOC following  Best Practice (right click and "Reselect all SmartList Selections" daily)   Diet/type: NPO - feeds  DVT prophylaxis: SCD  GI prophylaxis: PPI Lines: N/A Foley:  N/A Code Status:  full code Last date of multidisciplinary goals of care discussion [7/11 spoke with [name] and updated over phone; palliative following]  CRITICAL CARE Performed by: Mick Sell   Total critical care time: 40  minutes  Critical care time was exclusive of separately billable procedures and treating other patients.  Critical care was necessary to treat or prevent imminent or life-threatening deterioration.  Critical care was time spent personally by me on the following activities: development of treatment plan with patient and/or surrogate as well as nursing, discussions with consultants, evaluation of patient's response to treatment, examination of patient, obtaining history from patient or surrogate, ordering and performing treatments and interventions, ordering and review of laboratory studies, ordering and review of radiographic studies, pulse oximetry, re-evaluation of patient's condition and participation in multidisciplinary rounds.  Daughter updated at bedside 7/10 am   Palliative consulted yesterday  JD Rexene Agent [facility] 08/07/2021, 8:55 AM  Please see [email or web address] for pager details.  From 7A-7P if no response, please call [phone]. After hours, please call ELink [phone].

## 2021-08-07 NOTE — Procedures (Addendum)
Patient Name: Courtney Goodman  MRN: [id number]  Epilepsy Attending: Lora Havens  Referring Physician/Provider: Donnetta Simpers, MD  Duration: 08/06/2021 5790 to 08/07/2021 0934  Patient history: 76 year old female with altered mental status. EEG to evaluate for seizure  Level of alertness: Awake, asleep  AEDs during EEG study: None  Technical aspects: This EEG study was done with scalp electrodes positioned according to the 10-20 International system of electrode placement. Electrical activity was acquired at a sampling rate of '500Hz'$  and reviewed with a high frequency filter of '70Hz'$  and a low frequency filter of '1Hz'$ . EEG data were recorded continuously and digitally stored.   Description: No clear posterior dominant rhythm was seen. Sleep was characterized by vertex waves, sleep spindles (12 to 14 Hz), maximal frontocentral region.  EEG showed continuous generalized 3 to 7 Hz theta-delta slowing. Generalized periodic discharges with triphasic morphology at 0.25-1 Hz were also noted, predominantly when awake/stimulated. Hyperventilation and photic stimulation were not performed.     ABNORMALITY - Periodic discharges with triphasic morphology, generalized ( GPDs) - Continuous slow, generalized  IMPRESSION: This study showed generalized periodic discharges with triphasic morphology. This EEG pattern can be on the ictal interictal continuum.  However, the morphology, frequency and reactivity to stimulation is more commonly indicative of toxic-metabolic causes like hyperammonemia, uremia, cefepime toxicity. Additionally there is moderate to severe diffuse encephalopathy, nonspecific to etiology.  No seizures or  were seen throughout the recording.  Joli Koob [name]

## 2021-08-07 NOTE — Progress Notes (Signed)
EEG tech in to do maintenance. Patient had no skin breakdown. Patient had P8, P4, O1 and O2 re applied.

## 2021-08-07 NOTE — Progress Notes (Signed)
This chaplain responded to unit secretary's page for spiritual care.   The chaplain understands the page is for emotional support for the Pt. daughter in the waiting room. The Pt. daughter left for work before the chaplain arrived. The chaplain will connect with the PMT for opportunities for F/U.  Chaplain Sallyanne Kuster [phone]

## 2021-08-07 NOTE — Progress Notes (Signed)
Inpatient Diabetes Program Recommendations  AACE/ADA: New Consensus Statement on Inpatient Glycemic Control (2015)  Target Ranges:  Prepandial:   less than 140 mg/dL      Peak postprandial:   less than 180 mg/dL (1-2 hours)      Critically ill patients:  140 - 180 mg/dL   Lab Results  Component Value Date   GLUCAP 376 (H) 08/07/2021   HGBA1C 10.8 (H) 06/01/2021    Review of Glycemic Control  Latest Reference Range & Units 08/06/21 20:55 08/06/21 23:11 08/07/21 02:01 08/07/21 04:11 08/07/21 06:49 08/07/21 07:31  Glucose-Capillary 70 - 99 mg/dL 250 (H) 296 (H) 320 (H) 334 (H) 342 (H) 376 (H)   Diabetes history: DM 2 Outpatient Diabetes medications:  Tresiba 20 units daily, Fiasp 5-7 units tid Current orders for Inpatient glycemic control:  IV insulin started this morning Inpatient Diabetes Program Recommendations:   Agree with IV insulin. Will follow.   Thanks,  Adah Perl, RN, BC-ADM Inpatient Diabetes Coordinator Pager [phone]  (8a-5p)

## 2021-08-07 NOTE — Progress Notes (Deleted)
     [facility] Progress Note  Chief Complaint: ***  Subjective  History:  ***  ROS: Cardiovascular:  no chest pain Respiratory: no dyspnea  The patient's Past Medical, Family and Social History were reviewed and are on file in the EMR.  Objective:  Med list reviewed No current facility-administered medications for this visit. No current outpatient medications on file.  Facility-Administered Medications Ordered in Other Visits:    0.9 %  sodium chloride infusion, 250 mL, Intravenous, Continuous, Chesley Mires, MD, Stopped at 08/07/21 0633   0.9 %  sodium chloride infusion, , Intravenous, Continuous, Magdalen Spatz, NP, Last Rate: 100 mL/hr at 08/07/21 0700, Infusion Verify at 08/07/21 0700   cefTRIAXone (ROCEPHIN) 2 g in sodium chloride 0.9 % 100 mL IVPB, 2 g, Intravenous, Q12H, Agarwala, Ravi, MD, Last Rate: 200 mL/hr at 08/07/21 1003, 2 g at 08/07/21 1003   Chlorhexidine Gluconate Cloth 2 % PADS 6 each, 6 each, Topical, Daily, Sood, Vineet, MD, 6 each at 08/07/21 0952   dextrose 50 % solution 0-50 mL, 0-50 mL, Intravenous, PRN, Ogan, Kerry Kass, MD   docusate (COLACE) 50 MG/5ML liquid 100 mg, 100 mg, Per Tube, BID PRN, Andres Labrum D, PA-C   feeding supplement (VITAL AF 1.2 CAL) liquid 1,000 mL, 1,000 mL, Per Tube, Continuous, Agarwala, Ravi, MD, Last Rate: 60 mL/hr at 08/07/21 0700, Infusion Verify at 08/07/21 0700   fentaNYL (SUBLIMAZE) injection 25-100 mcg, 25-100 mcg, Intravenous, Q30 min PRN, Halford Chessman, Vineet, MD   insulin regular, human (MYXREDLIN) 100 units/ 100 mL infusion, , Intravenous, Continuous, Ogan, [place]wo U, MD, Last Rate: 5 mL/hr at 08/07/21 1322, 5 Units/hr at 08/07/21 1322   ipratropium-albuterol (DUONEB) 0.5-2.5 (3) MG/3ML nebulizer solution 3 mL, 3 mL, Nebulization, Q4H PRN, Halford Chessman, Vineet, MD   midazolam (VERSED) injection 1 mg, 1 mg, Intravenous, Q2H PRN, Halford Chessman, Vineet, MD   norepinephrine (LEVOPHED) '4mg'$  in 232m (0.016 mg/mL) premix infusion, 0-40  mcg/min, Intravenous, Titrated, [place], EShona Needles MD, Stopped at 08/07/21 1125   Oral care mouth rinse, 15 mL, Mouth Rinse, Q2H, Sood, Vineet, MD, 15 mL at 08/07/21 1324   Oral care mouth rinse, 15 mL, Mouth Rinse, PRN, SHalford Chessman Vineet, MD   pantoprazole (PROTONIX) injection 40 mg, 40 mg, Intravenous, Q12H, Agarwala, Ravi, MD, 40 mg at 08/07/21 0951   polyethylene glycol (MIRALAX / GLYCOLAX) packet 17 g, 17 g, Per Tube, Daily PRN, PMick Sell PA-C   Vital signs in last 24 hrs: There were no vitals filed for this visit. Wt Readings from Last 3 Encounters:  08/07/21 142 lb 6.7 oz (64.6 kg)  08/01/21 135 lb (61.2 kg)  06/20/21 135 lb (61.2 kg)    Physical Exam  *** HEENT: sclera anicteric, oral mucosa moist without lesions Neck: supple, no thyromegaly, JVD or lymphadenopathy Cardiac: ***,  no peripheral edema Pulm: clear to auscultation bilaterally, normal RR and effort noted Abdomen: soft, *** tenderness, with active bowel sounds. No guarding or palpable hepatosplenomegaly. Skin; warm and dry, no jaundice or rash  Labs:   ___________________________________________ Radiologic studies:   ____________________________________________ Other:   _____________________________________________ Assessment & Plan  Assessment: No diagnosis found.    Plan:   *** minutes were spent on this encounter (including chart review, history/exam, counseling/coordination of care, and documentation) > 50% of that time was spent on counseling and coordination of care.   HNelida MeuseIII

## 2021-08-07 NOTE — Progress Notes (Signed)
eLink Physician-Brief Progress Note Patient Name: Courtney Goodman DOB: 12-02-1945 MRN: [id number]   Date of Service  08/07/2021  HPI/Events of Note  Patient with persistent hyperglycemia despite extra dose of SQ insulin.  eICU Interventions  Insulin gtt ordered.        Courtney Goodman 08/07/2021, 6:55 AM

## 2021-08-08 ENCOUNTER — Inpatient Hospital Stay: Payer: Self-pay

## 2021-08-08 DIAGNOSIS — F0390 Unspecified dementia without behavioral disturbance: Secondary | ICD-10-CM | POA: Diagnosis not present

## 2021-08-08 DIAGNOSIS — R4182 Altered mental status, unspecified: Secondary | ICD-10-CM | POA: Diagnosis not present

## 2021-08-08 DIAGNOSIS — J189 Pneumonia, unspecified organism: Secondary | ICD-10-CM

## 2021-08-08 DIAGNOSIS — R627 Adult failure to thrive: Secondary | ICD-10-CM | POA: Diagnosis not present

## 2021-08-08 DIAGNOSIS — R569 Unspecified convulsions: Secondary | ICD-10-CM | POA: Diagnosis not present

## 2021-08-08 LAB — GLUCOSE, CAPILLARY
Glucose-Capillary: 130 mg/dL — ABNORMAL HIGH (ref 70–99)
Glucose-Capillary: 132 mg/dL — ABNORMAL HIGH (ref 70–99)
Glucose-Capillary: 133 mg/dL — ABNORMAL HIGH (ref 70–99)
Glucose-Capillary: 134 mg/dL — ABNORMAL HIGH (ref 70–99)
Glucose-Capillary: 140 mg/dL — ABNORMAL HIGH (ref 70–99)
Glucose-Capillary: 142 mg/dL — ABNORMAL HIGH (ref 70–99)
Glucose-Capillary: 143 mg/dL — ABNORMAL HIGH (ref 70–99)
Glucose-Capillary: 153 mg/dL — ABNORMAL HIGH (ref 70–99)
Glucose-Capillary: 155 mg/dL — ABNORMAL HIGH (ref 70–99)
Glucose-Capillary: 157 mg/dL — ABNORMAL HIGH (ref 70–99)
Glucose-Capillary: 158 mg/dL — ABNORMAL HIGH (ref 70–99)
Glucose-Capillary: 162 mg/dL — ABNORMAL HIGH (ref 70–99)

## 2021-08-08 LAB — BPAM PLATELET PHERESIS
Blood Product Expiration Date: 202307132359
Blood Product Expiration Date: 202307142359
ISSUE DATE / TIME: 202307110654
ISSUE DATE / TIME: 202307112300
Unit Type and Rh: 6200
Unit Type and Rh: 5100

## 2021-08-08 LAB — PREPARE PLATELET PHERESIS
Unit division: 0
Unit division: 0

## 2021-08-08 LAB — COMPREHENSIVE METABOLIC PANEL
ALT: 43 U/L (ref 0–44)
AST: 71 U/L — ABNORMAL HIGH (ref 15–41)
Albumin: <1.5 g/dL — ABNORMAL LOW (ref 3.5–5.0)
Alkaline Phosphatase: 133 U/L — ABNORMAL HIGH (ref 38–126)
Anion gap: 5 (ref 5–15)
BUN: 65 mg/dL — ABNORMAL HIGH (ref 8–23)
CO2: 15 mmol/L — ABNORMAL LOW (ref 22–32)
Calcium: 8 mg/dL — ABNORMAL LOW (ref 8.9–10.3)
Chloride: 117 mmol/L — ABNORMAL HIGH (ref 98–111)
Creatinine, Ser: 1.29 mg/dL — ABNORMAL HIGH (ref 0.44–1.00)
GFR, Estimated: 43 mL/min — ABNORMAL LOW (ref >60)
Glucose, Bld: 142 mg/dL — ABNORMAL HIGH (ref 70–99)
Potassium: 4.6 mmol/L (ref 3.5–5.1)
Sodium: 137 mmol/L (ref 135–145)
Total Bilirubin: 3.4 mg/dL — ABNORMAL HIGH (ref 0.3–1.2)
Total Protein: 5.1 g/dL — ABNORMAL LOW (ref 6.5–8.1)

## 2021-08-08 LAB — CBC
HCT: 23.2 % — ABNORMAL LOW (ref 36.0–46.0)
Hemoglobin: 7.9 g/dL — ABNORMAL LOW (ref 12.0–15.0)
MCH: 29 pg (ref 26.0–34.0)
MCHC: 34.1 g/dL (ref 30.0–36.0)
MCV: 85.3 fL (ref 80.0–100.0)
Platelets: 31 10*3/uL — ABNORMAL LOW (ref 150–400)
RBC: 2.72 MIL/uL — ABNORMAL LOW (ref 3.87–5.11)
RDW: 18.6 % — ABNORMAL HIGH (ref 11.5–15.5)
WBC: 20.5 10*3/uL — ABNORMAL HIGH (ref 4.0–10.5)
nRBC: 0.1 % (ref 0.0–0.2)

## 2021-08-08 MED ORDER — INSULIN DETEMIR 100 UNIT/ML ~~LOC~~ SOLN
12.0000 [IU] | Freq: Two times a day (BID) | SUBCUTANEOUS | Status: DC
  Administered 2021-08-08 – 2021-08-09 (×4): 12 [IU] via SUBCUTANEOUS
  Filled 2021-08-08 (×6): qty 0.12

## 2021-08-08 MED ORDER — DEXTROSE 10 % IV SOLN: INTRAVENOUS | Status: DC | PRN

## 2021-08-08 MED ORDER — INSULIN ASPART 100 UNIT/ML IJ SOLN
4.0000 [IU] | INTRAMUSCULAR | Status: DC
  Administered 2021-08-08 – 2021-08-10 (×9): 4 [IU] via SUBCUTANEOUS

## 2021-08-08 MED ORDER — SODIUM CHLORIDE 0.9% FLUSH
10.0000 mL | Freq: Two times a day (BID) | INTRAVENOUS | Status: DC
  Administered 2021-08-08 – 2021-08-09 (×3): 10 mL
  Administered 2021-08-10: 40 mL
  Administered 2021-08-10 – 2021-08-21 (×23): 10 mL
  Administered 2021-08-22: 20 mL
  Administered 2021-08-22: 10 mL
  Administered 2021-08-23: 20 mL
  Administered 2021-08-23 – 2021-08-24 (×2): 10 mL

## 2021-08-08 MED ORDER — WHITE PETROLATUM EX OINT
TOPICAL_OINTMENT | CUTANEOUS | Status: DC | PRN
  Administered 2021-08-15 (×2): 0.2 via TOPICAL
  Filled 2021-08-08: qty 28.35

## 2021-08-08 MED ORDER — INSULIN ASPART 100 UNIT/ML IJ SOLN
2.0000 [IU] | INTRAMUSCULAR | Status: DC
  Administered 2021-08-08 (×3): 2 [IU] via SUBCUTANEOUS
  Administered 2021-08-08: 4 [IU] via SUBCUTANEOUS

## 2021-08-08 NOTE — Procedures (Addendum)
Patient Name: ETHELWYN GILBERTSON  MRN: [id number]  Epilepsy Attending: Lora Havens  Referring Physician/Provider: Donnetta Simpers, MD  Duration: 08/07/2021 1021 to 08/08/2021 0934   Patient history: 76 year old female with altered mental status. EEG to evaluate for seizure   Level of alertness: Awake, asleep   AEDs during EEG study: None   Technical aspects: This EEG study was done with scalp electrodes positioned according to the 10-20 International system of electrode placement. Electrical activity was acquired at a sampling rate of '500Hz'$  and reviewed with a high frequency filter of '70Hz'$  and a low frequency filter of '1Hz'$ . EEG data were recorded continuously and digitally stored.    Description: No clear posterior dominant rhythm was seen. Sleep was characterized by vertex waves, sleep spindles (12 to 14 Hz), maximal frontocentral region.  EEG showed continuous generalized 3 to 7 Hz theta-delta slowing. Generalized qasi-periodic discharges with triphasic morphology every 3-5 seconds were also noted, predominantly when awake/stimulated. Hyperventilation and photic stimulation were not performed.      ABNORMALITY - Periodic discharges with triphasic morphology, generalized ( GPDs) - Continuous slow, generalized   IMPRESSION: This study showed generalized periodic discharges with triphasic morphology. This EEG pattern can be on the ictal interictal continuum.  However, the morphology, frequency and reactivity to stimulation is more commonly indicative of toxic-metabolic causes like hyperammonemia, uremia, cefepime toxicity. Additionally there is moderate to severe diffuse encephalopathy, nonspecific to etiology.  No seizures or  were seen throughout the recording.   Giovanne Nickolson [name]

## 2021-08-08 NOTE — Progress Notes (Signed)
LTM maint complete - no skin breakdown All impedances under 10k ohms. MB maint. FZ, FP1, T4, electrodes.

## 2021-08-08 NOTE — Procedures (Signed)
Patient Name: DAVION MEARA  MRN: [id number]  Epilepsy Attending: Lora Havens  Referring Physician/Provider: Donnetta Simpers, MD  Duration: 08/08/2021 0934 to 08/08/2021 1150   Patient history: 76 year old female with altered mental status. EEG to evaluate for seizure   Level of alertness: Awake, asleep   AEDs during EEG study: None   Technical aspects: This EEG study was done with scalp electrodes positioned according to the 10-20 International system of electrode placement. Electrical activity was acquired at a sampling rate of '500Hz'$  and reviewed with a high frequency filter of '70Hz'$  and a low frequency filter of '1Hz'$ . EEG data were recorded continuously and digitally stored.    Description: No clear posterior dominant rhythm was seen. Sleep was characterized by vertex waves, sleep spindles (12 to 14 Hz), maximal frontocentral region.  EEG showed continuous generalized 3 to 7 Hz theta-delta slowing. Generalized qasi-periodic discharges with triphasic morphology every 3-5 seconds were also noted, predominantly when awake/stimulated. Hyperventilation and photic stimulation were not performed.      ABNORMALITY - Periodic discharges with triphasic morphology, generalized ( GPDs) - Continuous slow, generalized   IMPRESSION: This study showed generalized periodic discharges with triphasic morphology. This EEG pattern can be on the ictal interictal continuum.  However, the morphology, frequency and reactivity to stimulation is more commonly indicative of toxic-metabolic causes like hyperammonemia, uremia, cefepime toxicity. Additionally there is moderate to severe diffuse encephalopathy, nonspecific to etiology.  No seizures or  were seen throughout the recording.   Sahory Nordling [name]

## 2021-08-08 NOTE — Progress Notes (Signed)
Patient ID: Courtney Goodman, female   DOB: 08-28-1945, 76 y.o.   MRN: [id number]    Progress Note from the Palliative Medicine Team at [facility]   Patient Name: Courtney Goodman        Date: 08/08/2021 DOB: Jul 16, 1945  Age: 76 y.o. MRN#: [id number] Attending Physician: Kipp Brood, MD Primary Care Physician: [name] Admit Date: 08/04/2021   Medical records reviewed   76 y.o. female  admitted on 08/18/2021 through the ER with AMS and new Lt facial droop.  She was in hospital from 05/31/21 to 06/06/21 for AMS, septic shock with Klebsiella bacteremia, hypodense lesion in pancreatic head, and uncontrolled diabetes.  Reported to have fever at home.  Intubated in ER.  Started on ABx and pressors.  Neuro consulted in ER.  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  CT chest/abd/pelvis showed bronchiectasis with mucus plugging and basilar consolidation, improved appearance of pancreatic head, stool balls in rectum, and increasing lytic lesion in superior endplate wedge deformity of L1 with gas concerning for osteomyelitis. Found to have worsening anemia and PRBC ordered.  EEG and MRI imaging ordered.  Patient admitted for treatment stabilization.   Neuro consult note.  Blood cultures positive for k. Pneumonia, urine culture positive for greater than 100,000 gram-negative rods.   Patient currently intubated,    Gathered more pertinent history today from daughter at bedside.  She reports that her mother has had continued physical,  functional and cognitive decline over the past many months.  Patient lives at home with her daughter requiring 24/7 care, dependent for all ADLs   Family face treatment option decisions, advanced directive decisions and anticipatory care needs.    This NP assessed patient at the bedside as a follow up for palliative medicine needs and emotional support.    Dr Tamala Julian at bedside with daughter/ Courtney Goodman, medical update offered.   I stayed with  Courtney Goodman and offered emotional support, daughter is upset with information regarding the seriousness of the situation.  Education offered on the  likely trajectory of hospital illness, specifically as it relates to dementia.   Questions and concerns addressed   Education offered today regarding  the importance of continued conversation with family and their  medical providers regarding overall plan of care and treatment options,  ensuring decisions are within the context of the patients values and GOCs.  PMT will continue to support holistically     Discussed with Dr Garvin Fila NP  Palliative Medicine Team Team Phone # [phone] Pager [phone]

## 2021-08-08 NOTE — Progress Notes (Signed)
Brief HPI:  Courtney Goodman is a 76 year old female with a past medical history for anemia, asthma, CAD, CKD, COPD, DM type II, dysrhythmia, AAA, dementia elevated ferritin, dementia, septic shock secondary to Klebsiella in May 2023.  She was intubated in the ED on 7/8 due to GCS of 4 where she also was hyperglycemic, anemic, septic, and hypotensive.  Subjective: Seen in room, off sedation.  Norepinephrine is off. Daughter is at the bedside. PLT 31 this morning after transfusion. She is localizing in upper extremities. Grimacing to painful stimuli. She may have some eyelid apraxia in the setting of vascular dementia. There is an attempt to open her eyes to verbal stimuli. She is weaning 5/5 at 40% FiO2.   Exam: Vitals:   08/08/21 0800 08/08/21 0900  BP:    Pulse: 79 81  Resp: (!) 24 (!) 22  Temp:    SpO2: 100% 100%   Gen: In bed, NAD Resp: non-labored breathing, no acute distress, mechanically ventilated Abd: soft, nt  Neuro: Courtney: Makes an attempt to open eyes with verbal stimuli, does not follow commands, will grimace to noxious stimuli.  She does resist forced eye opening. CN: Pupils 3 mm bilaterally and brisk, resistance to forced eye opening, corneals present, cough and gag present, head is midline Motor: seems to localize and move right upper extremity purposefully.  Sensory: minimally withdraws to pain in all extremities, left more than right   Pertinent Labs: WBC 37.1 -> 33.1-> 32.5 -> 20.5 PLT 197 ->38 -> 8 -> 31 Albumin- <1.5 Lactic Acid- 2.4 K 3.2 -> 4.6 Cr 2.70-> 2.07-> 1.63 -> 1.29  Imaging: MRI brain- A few small acute infarcts in left frontoparietal cortex. Remote right caudate and left thalamus lacunar infarcts. MRI C-spine- Multilevel degenerative changes. Severe bilateral neuroforaminal stenosis at C3-C4. MRI T-spine/L-spine- Findings consistent with osteomyelitis discitis at T12-L1 with pathologic fracture of the L1 vertebral body and bilateral paravertebral  abscesses. Small anterior epidural collection at T12 with left ventral epidural extension superiorly up to T9 suspicious for phlegmon and/or abscess. It is difficult to distinguish between the two without intravenous contrast.4.4 cm paraspinous fluid collection on the right at T11 and T12, suspicious for abscess. Multilevel thoracolumbar spondylosis as described above. Moderate to severe spinal canal and severe right neuroforaminal stenosis at L3-L4.  Overnight EEG with generalized periodic discharges with triphasic morphology, more commonly from toxic metabolic derangements rather than seizures.  7/12 Overnight EEG- This study showed generalized periodic discharges with triphasic morphology. This EEG pattern can be on the ictal interictal continuum.  However, the morphology, frequency and reactivity to stimulation is more commonly indicative of toxic-metabolic causes like hyperammonemia, uremia, cefepime toxicity. Additionally there is moderate to severe diffuse encephalopathy, nonspecific to etiology.  Impression:  76 year old female with an extensive past medical history presenting with altered mental status requiring intubation and subsequently found to have hyperglycemia, anemia with hypotension, leukocytosis (WBC 38) and lactic acidosis (6.9).  Spinal imaging shows osteomyelitis from T12-L1 and suspicion for an abscess at T9 and T11 and T12. Antibiotics were switched to Rocephin per CCM due to significant drop in platelets (38 -> 8).  Leukocytosis has improved to 32.5, lactic acid at 2.4, and kidney function has improved with Cr at 1.63.  With her advanced age and debility along with current systemic issues, prognosis for a meaningful recovery from a neurological standpoint remains guarded to poor. Given Kleb bacteremia, Dr. Michelle Piper thought of CNS infection is also very possible and hence the prognosis as above.  She  might have a prolonged course for return to her baseline mentation given her  advanced age and comorbidities irrespective of what current systemic infections and derangements she has.  IMP Multifactorial toxic metabolic encephalopathy with septic shock Possible CNS infection in the setting of Kleb bacteremia and osteo Multiple other comorbidities Continuing poor neurological exam   Recommendations: -Continue antibiotics and hydration per CCM - May need picc line for long term abx - discontinue LTM  Plan of care directed by Dr. Rory Percy. MD to update note as needed.   Janine Ores, DNP, FNP-BC [facility] Pager: [phone]   Attending Neurohospitalist Addendum Patient seen and examined with APP/Resident. Agree with the history and physical as documented above. Agree with the plan as documented, which I helped formulate. I have independently reviewed the chart, obtained history, review of systems and examined the patient.I have personally reviewed pertinent head/neck/spine imaging (CT/MRI). Please feel free to call with any questions.  -- Amie Portland, MD Neurologist [facility] Pager: [phone]

## 2021-08-08 NOTE — Progress Notes (Addendum)
NAME:  Courtney Goodman, MRN:  [id number], DOB:  1946-01-15, LOS: 4 ADMISSION DATE:  08/13/2021, CONSULTATION DATE:  08/04/21 REFERRING MD:  Dr. Armandina Gemma, ER, CHIEF COMPLAINT:  AMS   History of Present Illness:  76 yo female former smoker sent to ER with AMS and new Lt facial droop.  She was in hospital from 05/31/21 to 06/06/21 for AMS, septic shock with Klebsiella bacteremia, hypodense lesion in pancreatic head, and uncontrolled diabetes.  Reported to have fever at home.  Intubated in ER.  Started on ABx and pressors.  Neuro consulted in ER.  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  CT chest/abd/pelvis showed bronchiectasis with mucus plugging and basilar consolidation, improved appearance of pancreatic head, stool balls in rectum, and increasing lytic lesion in superior endplate wedge deformity of L1 with gas concerning for osteomyelitis. Found to have worsening anemia and PRBC ordered.  EEG and MRI imaging ordered.  PCCM asked to arrange for admission to ICU.  Hx from chart and medical team.  Pertinent  Medical History  DM type 2, HTN, HLD, CAD, CVA, Asthma/COPD, CKD 3, Anxiety with depression, Dementia  Significant Hospital Events: Including procedures, antibiotic start and stop dates in addition to other pertinent events   7/08 Admit, neuro consulted 7/9 Blood cultures positive for K.pneumoniae 7/8 Urine Cx + for > 100,000 colonies gram negative rods 7/11: platelets 8 transfused plt  Interim History / Subjective:  Platelets 10 transfused 1 unit of platelets overnight  This am mental status slowly improving; eyes slightly opened to voice; more withdrawal and grimace to painful stimuli Off levo On insulin drip 2.8/hr On psv on vent doing well  Objective   Blood pressure (!) 142/56, pulse 82, temperature 98.5 F (36.9 C), temperature source Oral, resp. rate (!) 23, height '5\' 5"'$  (1.651 m), weight 73.5 kg, SpO2 100 %. CVP:  [6 mmHg-68 mmHg] 7 mmHg  Vent Mode: PRVC FiO2  (%):  [40 %] 40 % Set Rate:  [16 bmp] 16 bmp Vt Set:  [450 mL] 450 mL PEEP:  [5 cmH20] 5 cmH20 Pressure Support:  [5 cmH20] 5 cmH20 Plateau Pressure:  [14 cmH20-18 cmH20] 18 cmH20   Intake/Output Summary (Last 24 hours) at 08/08/2021 0720 Last data filed at 08/08/2021 0600 Gross per 24 hour  Intake 4373.97 ml  Output 945 ml  Net 3428.97 ml    Filed Weights   08/06/21 0500 08/07/21 0500 08/08/21 0600  Weight: 60.3 kg 64.6 kg 73.5 kg    Examination:  General:  critically ill appearing on mech vent HEENT: MM pink/moist; ETT/EEG in place Neuro: mental status slowly improving; eyes slightly opened to voice; more withdrawal and grimace to painful stimuli; PERRL CV: s1s2, RRR, no m/r/g PULM:  dim clear BS bilaterally; on mech vent PRVC GI: soft, bsx4 active  Extremities: warm/dry, trace edema; pressure sore on bottom with bandage in place Skin: no rashes or lesions appreciated  Ancillary tests personally reviewed:   MTI Brain 7/10 Cervical spine:   1. No acute abnormality.  No osteomyelitis discitis. 2. Multilevel degenerative changes. Severe bilateral neuroforaminal stenosis at C3-C4.   Thoracic and lumbar spine:   1. 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 2. Small anterior epidural collection at T12 with left ventral epidural extension superiorly up to T9 suspicious for phlegmon and/or abscess. It is difficult to distinguish between the two without intravenous contrast. 3. 4.4 cm paraspinous fluid collection  on the right at T11 and T12, suspicious for abscess. 4. Multilevel thoracolumbar spondylosis as described above. Moderate to severe spinal canal and severe right neuroforaminal stenosis at L3-L4 Assessment & Plan:   Acute hypoxic respiratory failure with compromised airway Hx of COPD/asthma and bronchiectasis. P: -continue PSV trials during day as  tolerated and rest on PRVC at night -will hold on extubation due to poor mental status -Wean PEEP/FiO2 for SpO2 >92% -VAP bundle in place -Daily SAT and SBT -PAD protocol in place -Follow intermittent CXR and ABG PRN -prn duoneb for wheezing  AMS with Lt facial droop. Hx of CVA, dementia, Anxiety, Depression. -MRI 7/9 no significant findings P: -neuro following appreciate recs -EEG per neuro -limit sedating meds  Septic shock due to recurrent K.pneumoniae infection.  Concern for osteo - unclear source >> bacterial lower lobar pneumonia, UTI, and/or L1 spine abscess/ T 9  suspicious for phlegmon and/or abscess. Abscess would be a likely candidate for recurrent bacteremia.  P: -continue rocephin -follow cultures -off pressors -continue IV fluids -repeat imaging per NSG -will need 6 weeks abx; PICC line ordered now that creatinine is improving -trend wbc/fever curve  Hyperglycemia P: -transition off insulin drip to ssi -continue levemir -cbg monitoring  Hypokalemia P: -trend and replete electrolytes as needed  Stool impaction. - disimpacted in ER P: -BR prn -monitor for BM  Thrombocytopenia: no heparin; likely sepsis related Anemia of critical illness, possible GI bleed due to low HB and CG on NGT aspirate P: -continue platelet transfusion overnight -platelets trending up -trend CBC and transfuse as needed  AKI from shock. CKD 3b. P: -Trend BMP / urinary output -Replace electrolytes as indicated -Avoid nephrotoxic agents, ensure adequate renal perfusion  Severe protein calorie malnutrition. P: - Continue TF  Pressure injury Stage 3 of Sacrum, present on admission  P: -WOC following  Best Practice (right click and "Reselect all SmartList Selections" daily)   Diet/type: NPO - feeds  DVT prophylaxis: SCD  GI prophylaxis: PPI Lines: N/A Foley:  N/A Code Status:  full code Last date of multidisciplinary goals of care discussion [7/12 spoke with [name]  and updated over phone; palliative following; [name] states that she would still like to continue full code and continue everything]  CRITICAL CARE  Total critical care time: 35  minutes  JD Rexene Agent [facility] 08/08/2021, 7:20 AM  Please see [email or web address] for pager details.  From 7A-7P if no response, please call [phone]. After hours, please call ELink [phone].

## 2021-08-08 NOTE — Progress Notes (Signed)
LTM EEG discontinued - no skin breakdown at unhook.   

## 2021-08-09 ENCOUNTER — Inpatient Hospital Stay (HOSPITAL_COMMUNITY): Payer: Medicare HMO

## 2021-08-09 ENCOUNTER — Inpatient Hospital Stay: Payer: Self-pay

## 2021-08-09 ENCOUNTER — Other Ambulatory Visit: Payer: Self-pay

## 2021-08-09 ENCOUNTER — Ambulatory Visit: Payer: Self-pay

## 2021-08-09 DIAGNOSIS — R4182 Altered mental status, unspecified: Secondary | ICD-10-CM | POA: Diagnosis not present

## 2021-08-09 DIAGNOSIS — R569 Unspecified convulsions: Secondary | ICD-10-CM | POA: Diagnosis not present

## 2021-08-09 DIAGNOSIS — M4620 Osteomyelitis of vertebra, site unspecified: Secondary | ICD-10-CM | POA: Diagnosis not present

## 2021-08-09 DIAGNOSIS — Z9911 Dependence on respirator [ventilator] status: Secondary | ICD-10-CM | POA: Diagnosis not present

## 2021-08-09 DIAGNOSIS — F0390 Unspecified dementia without behavioral disturbance: Secondary | ICD-10-CM | POA: Diagnosis not present

## 2021-08-09 DIAGNOSIS — J9601 Acute respiratory failure with hypoxia: Secondary | ICD-10-CM | POA: Diagnosis not present

## 2021-08-09 LAB — COMPREHENSIVE METABOLIC PANEL
ALT: 32 U/L (ref 0–44)
AST: 44 U/L — ABNORMAL HIGH (ref 15–41)
Albumin: <1.5 g/dL — ABNORMAL LOW (ref 3.5–5.0)
Alkaline Phosphatase: 77 U/L (ref 38–126)
Anion gap: 5 (ref 5–15)
BUN: 52 mg/dL — ABNORMAL HIGH (ref 8–23)
CO2: 11 mmol/L — ABNORMAL LOW (ref 22–32)
Calcium: 5.9 mg/dL — CL (ref 8.9–10.3)
Chloride: 127 mmol/L — ABNORMAL HIGH (ref 98–111)
Creatinine, Ser: 0.84 mg/dL (ref 0.44–1.00)
GFR, Estimated: >60 mL/min (ref >60)
Glucose, Bld: 84 mg/dL (ref 70–99)
Potassium: 3.3 mmol/L — ABNORMAL LOW (ref 3.5–5.1)
Sodium: 143 mmol/L (ref 135–145)
Total Bilirubin: 1.9 mg/dL — ABNORMAL HIGH (ref 0.3–1.2)
Total Protein: 3.2 g/dL — ABNORMAL LOW (ref 6.5–8.1)

## 2021-08-09 LAB — LACTIC ACID, PLASMA: Lactic Acid, Venous: 1.4 mmol/L (ref 0.5–1.9)

## 2021-08-09 LAB — GLUCOSE, CAPILLARY
Glucose-Capillary: 116 mg/dL — ABNORMAL HIGH (ref 70–99)
Glucose-Capillary: 92 mg/dL (ref 70–99)
Glucose-Capillary: 111 mg/dL — ABNORMAL HIGH (ref 70–99)
Glucose-Capillary: 89 mg/dL (ref 70–99)
Glucose-Capillary: 85 mg/dL (ref 70–99)
Glucose-Capillary: 80 mg/dL (ref 70–99)

## 2021-08-09 LAB — BASIC METABOLIC PANEL
Anion gap: 18 — ABNORMAL HIGH (ref 5–15)
BUN: 66 mg/dL — ABNORMAL HIGH (ref 8–23)
CO2: 14 mmol/L — ABNORMAL LOW (ref 22–32)
Calcium: 7.3 mg/dL — ABNORMAL LOW (ref 8.9–10.3)
Chloride: 120 mmol/L — ABNORMAL HIGH (ref 98–111)
Creatinine, Ser: 1.08 mg/dL — ABNORMAL HIGH (ref 0.44–1.00)
GFR, Estimated: 53 mL/min — ABNORMAL LOW (ref >60)
Glucose, Bld: 72 mg/dL (ref 70–99)
Potassium: 5.3 mmol/L — ABNORMAL HIGH (ref 3.5–5.1)
Sodium: 152 mmol/L — ABNORMAL HIGH (ref 135–145)

## 2021-08-09 LAB — CBC
HCT: 22.8 % — ABNORMAL LOW (ref 36.0–46.0)
Hemoglobin: 7.7 g/dL — ABNORMAL LOW (ref 12.0–15.0)
MCH: 28.9 pg (ref 26.0–34.0)
MCHC: 33.8 g/dL (ref 30.0–36.0)
MCV: 85.7 fL (ref 80.0–100.0)
Platelets: 21 10*3/uL — CL (ref 150–400)
RBC: 2.66 MIL/uL — ABNORMAL LOW (ref 3.87–5.11)
RDW: 18.5 % — ABNORMAL HIGH (ref 11.5–15.5)
WBC: 20.7 10*3/uL — ABNORMAL HIGH (ref 4.0–10.5)
nRBC: 0.1 % (ref 0.0–0.2)

## 2021-08-09 LAB — PHOSPHORUS
Phosphorus: 1.3 mg/dL — ABNORMAL LOW (ref 2.5–4.6)
Phosphorus: 2.3 mg/dL — ABNORMAL LOW (ref 2.5–4.6)

## 2021-08-09 LAB — CULTURE, BLOOD (ROUTINE X 2)
Culture: NO GROWTH
Special Requests: ADEQUATE

## 2021-08-09 LAB — MAGNESIUM: Magnesium: 1.4 mg/dL — ABNORMAL LOW (ref 1.7–2.4)

## 2021-08-09 MED ORDER — JUVEN PO PACK
1.0000 | PACK | Freq: Two times a day (BID) | ORAL | Status: DC
  Administered 2021-08-10 – 2021-08-24 (×29): 1
  Filled 2021-08-09 (×30): qty 1

## 2021-08-09 MED ORDER — SODIUM CHLORIDE 0.9% FLUSH
10.0000 mL | INTRAVENOUS | Status: DC | PRN
  Administered 2021-08-19 – 2021-08-23 (×2): 10 mL

## 2021-08-09 MED ORDER — POTASSIUM CHLORIDE 10 MEQ/50ML IV SOLN
10.0000 meq | INTRAVENOUS | Status: AC
  Administered 2021-08-09 (×4): 10 meq via INTRAVENOUS
  Filled 2021-08-09 (×3): qty 50

## 2021-08-09 MED ORDER — VITAL AF 1.2 CAL PO LIQD: 1000.0000 mL | ORAL | Status: DC

## 2021-08-09 MED ORDER — SODIUM CHLORIDE 0.9% FLUSH
10.0000 mL | Freq: Two times a day (BID) | INTRAVENOUS | Status: DC
  Administered 2021-08-09: 10 mL

## 2021-08-09 MED ORDER — DEXTROSE 5 % IV SOLN
15.0000 mmol | Freq: Once | INTRAVENOUS | Status: AC
  Administered 2021-08-09: 15 mmol via INTRAVENOUS
  Filled 2021-08-09: qty 5

## 2021-08-09 MED ORDER — POTASSIUM CHLORIDE 20 MEQ PO PACK
20.0000 meq | PACK | ORAL | Status: AC
  Administered 2021-08-09 (×2): 20 meq
  Filled 2021-08-09 (×2): qty 1

## 2021-08-09 MED ORDER — POLYETHYLENE GLYCOL 3350 17 G PO PACK
17.0000 g | PACK | Freq: Every day | ORAL | Status: DC
  Administered 2021-08-10 – 2021-08-24 (×11): 17 g
  Filled 2021-08-09 (×11): qty 1

## 2021-08-09 MED ORDER — DOCUSATE SODIUM 50 MG/5ML PO LIQD
100.0000 mg | Freq: Two times a day (BID) | ORAL | Status: DC
  Administered 2021-08-10 – 2021-08-24 (×24): 100 mg
  Filled 2021-08-09 (×25): qty 10

## 2021-08-09 MED ORDER — CALCIUM GLUCONATE-NACL 2-0.675 GM/100ML-% IV SOLN
2.0000 g | Freq: Once | INTRAVENOUS | Status: AC
Start: 2021-08-09 — End: 2021-08-09
  Administered 2021-08-09: 2000 mg via INTRAVENOUS
  Filled 2021-08-09: qty 100

## 2021-08-09 MED ORDER — THIAMINE HCL 100 MG PO TABS
100.0000 mg | ORAL_TABLET | Freq: Every day | ORAL | Status: AC
  Administered 2021-08-09 – 2021-08-15 (×7): 100 mg via ORAL
  Filled 2021-08-09 (×7): qty 1

## 2021-08-09 NOTE — Procedures (Signed)
Patient Name: Courtney Goodman  MRN: [id number]  Epilepsy Attending: Lora Havens  Referring Physician/Provider: Janine Ores, NP  Date: 08/09/2021  Duration: 23.59 mins  Patient history:  76 year old female with altered mental status. EEG to evaluate for seizure   Level of alertness: lethargic   AEDs during EEG study: None   Technical aspects: This EEG study was done with scalp electrodes positioned according to the 10-20 International system of electrode placement. Electrical activity was acquired at a sampling rate of '500Hz'$  and reviewed with a high frequency filter of '70Hz'$  and a low frequency filter of '1Hz'$ . EEG data were recorded continuously and digitally stored.    Description: No clear posterior dominant rhythm was seen. EEG showed continuous generalized 3 to 7 Hz theta-delta slowing, at times with triphasic morphology. Hyperventilation and photic stimulation were not performed.      ABNORMALITY - Continuous slow, generalized   IMPRESSION: This study is suggestive of moderate to severe diffuse encephalopathy, nonspecific to etiology but likely secondary to toxic, metabolic causes.  No seizures or definite epileptiform discharges were seen throughout the recording.   [name] [name]

## 2021-08-09 NOTE — Progress Notes (Signed)
Patient ID: Courtney Goodman, female   DOB: 05-22-1945, 76 y.o.   MRN: [id number]    Progress Note from the Palliative Medicine Team at [facility]   Patient Name: Courtney Goodman        Date: 08/09/2021 DOB: 1945-05-04  Age: 76 y.o. MRN#: [id number] Attending Physician: Kipp Brood, MD Primary Care Physician: [name] Admit Date: 07/28/2021   Medical records reviewed   76 y.o. female  admitted on 08/12/2021 through the ER with AMS and new Lt facial droop.  She was in hospital from 05/31/21 to 06/06/21 for AMS, septic shock with Klebsiella bacteremia, hypodense lesion in pancreatic head, and uncontrolled diabetes.  Reported to have fever at home.  Intubated in ER.  Started on ABx and pressors.  Neuro consulted in ER.  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  CT chest/abd/pelvis showed bronchiectasis with mucus plugging and basilar consolidation, improved appearance of pancreatic head, stool balls in rectum, and increasing lytic lesion in superior endplate wedge deformity of L1 with gas concerning for osteomyelitis. Found to have worsening anemia and PRBC ordered.  EEG and MRI imaging ordered.  Patient admitted for treatment stabilization.   Neuro consult note.  Blood cultures positive for k. Pneumonia, urine culture positive for greater than 100,000 gram-negative rods.   Patient currently intubated.    Gathered more pertinent history today from daughter at bedside.  She reports that her mother has had continued physical,  functional and cognitive decline over the past many months.  Patient lives at home with her daughter requiring 24/7 care, dependent for all ADLs  Significant Hospital events  7/08 Admit, neuro consulted 7/9 Blood cultures positive for K.pneumoniae 7/8 Urine Cx + for > 100,000 colonies gram negative rods 7/10 Thoracic and lumbar spine: 1. Findings consistent with osteomyelitis discitis at T12-L1 with pathologic fracture of the L1 vertebral body and  bilateral paravertebral abscesses. 2. Small anterior epidural collection at T12 with left ventral epidural extension superiorly up to T9 suspicious for phlegmon and/or abscess. It is difficult to distinguish between the two without intravenous contrast. 3. 4.4 cm paraspinous fluid collection on the right at T11 and T12, suspicious for abscess. 4. Multilevel thoracolumbar spondylosis as described above. Moderate to severe spinal canal and severe right neuroforaminal stenosis at L3-L4 7/11: platelets 8 transfused plt; off levo 7/13: EEG off; remains intubated. PLTs 21K. Weaning but mental status still barrier to extubation     Family face treatment option decisions, advanced directive decisions and anticipatory care needs.    This NP assessed patient at the bedside as a follow up for palliative medicine needs and emotional support.    I spoke to daughter Courtney Goodman by telephone for continued regarding current medical situation.  Again we spoke to the seriousness in this situation specific paravertebral abscesses/osteomyelitis complicated by underlying significant dementia.  Education offered on the difference between aggressive medical intervention path versus a palliative comfort path for this,Patient at this time and in this situation.  Education offered looking at best case scenario versus worst-case scenario in a critically ill patient with end-stage dementia.  Encouraged to consider possible long term outcome of trach, PEG and skilled nursing facility placement.   Daughter verbalizes that she is hearing the information being delivered.  Questions and concerns addressed   Education offered today regarding  the importance of continued conversation with patient's family and the medical providers regarding overall plan of care and treatment options,  ensuring decisions are within  the context of the patients values and GOCs.  PMT will continue to support holistically     Discussed with CCM  and bedside RN  Wadie Lessen NP  Palliative Medicine Team Team Phone # [phone] Pager [phone]

## 2021-08-09 NOTE — Progress Notes (Signed)
EEG completed, results pending. 

## 2021-08-09 NOTE — Progress Notes (Signed)
Neurology progress note  Brief HPI:  Courtney Goodman is a 76 year old female with a past medical history for anemia, asthma, CAD, CKD, COPD, DM type II, dysrhythmia, AAA, dementia elevated ferritin, dementia, septic shock secondary to Klebsiella in May 2023.  She was intubated in the ED on 7/8 due to GCS of 4 where she also was hyperglycemic, anemic, septic, and hypotensive.  Subjective: Seen in room, off sedation.  Norepinephrine is off.  No family at the bedside this morning. Eyes are open upon examiner walking into the room, does closes them spontaneously and does not open them to commands. She does appear to have a left gaze preference. She is localizing in upper extremities. Grimacing to painful stimuli. And resists forced eye opening. She is weaning 5/5 at 40% FiO2. CCM holding off on extubation given weak cough and gag and she is not following commands or tracking examiner in the room.   Exam: Vitals:   08/09/21 0700 08/09/21 0750  BP:    Pulse: 91   Resp: 14   Temp:  99.7 F (37.6 C)  SpO2: 100%    Gen: In bed, NAD Resp: non-labored breathing, no acute distress, mechanically ventilated Abd: soft, nt  Neuro: Courtney: Makes an attempt to open eyes with verbal stimuli, does not follow commands, will grimace to noxious stimuli.  She does resist forced eye opening. She does not track.  CN: Pupils 3 mm bilaterally and brisk, resistance to forced eye opening, left gaze preference, corneals present, cough and gag present, head is midline Motor: seems to localize and move right upper extremity purposefully.  Sensory: minimally withdraws to pain in all extremities, left more than right   Pertinent Labs: WBC 20.7 PLT 197 ->38 -> 8 -> 31-> 21 Albumin- <1.5 Lactic Acid- 2.4 K 3.2 -> 4.6 ->3.3 Cr 2.70-> 2.07-> 1.63 -> 1.29 ->0.84  Imaging: MRI brain- A few small acute infarcts in left frontoparietal cortex. Remote right caudate and left thalamus lacunar infarcts. MRI C-spine- Multilevel  degenerative changes. Severe bilateral neuroforaminal stenosis at C3-C4. MRI T-spine/L-spine- Findings consistent with osteomyelitis discitis at T12-L1 with pathologic fracture of the L1 vertebral body and bilateral paravertebral abscesses. Small anterior epidural collection at T12 with left ventral epidural extension superiorly up to T9 suspicious for phlegmon and/or abscess. It is difficult to distinguish between the two without intravenous contrast.4.4 cm paraspinous fluid collection on the right at T11 and T12, suspicious for abscess. Multilevel thoracolumbar spondylosis as described above. Moderate to severe spinal canal and severe right neuroforaminal stenosis at L3-L4.  EEG over a couple nights with generalized periodic discharges with triphasic morphology.  No evolution to seizures.  Continuous EEG was discontinued.  Assessment:  76 year old female with an extensive past medical history presenting with altered mental status requiring intubation and subsequently found to have hyperglycemia, anemia with hypotension, leukocytosis (WBC 38) and lactic acidosis (6.9).  Spinal imaging shows osteomyelitis from T12-L1 and suspicion for an abscess at T9 and T11 and T12. Antibiotics were switched to Rocephin per CCM due to significant drop in platelets (38 -> 8).  Leukocytosis has improved to 20.4 and kidney function has improved with Cr at 0.84.  With her advanced age and debility along with current systemic issues, prognosis for a meaningful recovery from a neurological standpoint remains guarded to poor. Given Kleb bacteremia, thought of CNS infection is also very possible and hence the prognosis as above.  She might have a prolonged course for return to her baseline mentation given her advanced age and  comorbidities irrespective of what current systemic infections and derangements she has.  Compared to the exam from 4 days ago, there is very small improvement-keeps eyes open spontaneously and also has  grimace and mild localization better in the upper extremities to noxious stimulation.  That said, concerning finding today is leftward gaze for which I will get a spot EEG.  Impression Multifactorial toxic metabolic encephalopathy with septic shock Possible CNS infection in the setting of Kleb bacteremia and osteo Multiple other comorbidities Continuing poor neurological exam   Recommendations: - Continue antibiotics and hydration per CCM - Spot EEG to evaluate left gaze preference  Plan of care directed by Dr. Rory Percy. MD to update note as needed.   Janine Ores, DNP, FNP-BC [facility] Pager: [phone]  Attending Neurohospitalist Addendum Patient seen and examined with APP/Resident. Agree with the history and physical as documented above. Agree with the plan as documented, which I helped formulate. I have independently reviewed the chart, obtained history, review of systems and examined the patient.I have personally reviewed pertinent head/neck/spine imaging (CT/MRI). Please feel free to call with any questions.  -- Amie Portland, MD Neurologist [facility] Pager: [phone]   CRITICAL CARE ATTESTATION Performed by: Amie Portland, MD Total critical care time: 33 minutes Critical care time was exclusive of separately billable procedures and treating other patients and/or supervising APPs/Residents/Students Critical care was necessary to treat or prevent imminent or life-threatening deterioration due to toxic metabolic encephalopathy This patient is critically ill and at significant risk for neurological worsening and/or death and care requires constant monitoring. Critical care was time spent personally by me on the following activities: development of treatment plan with patient and/or surrogate as well as nursing, discussions with consultants, evaluation of patient's response to treatment, examination of patient, obtaining history from patient or  surrogate, ordering and performing treatments and interventions, ordering and review of laboratory studies, ordering and review of radiographic studies, pulse oximetry, re-evaluation of patient's condition, participation in multidisciplinary rounds and medical decision making of high complexity in the care of this patient.

## 2021-08-09 NOTE — Progress Notes (Signed)
Peripherally Inserted Central Catheter Placement  The IV Nurse has discussed with the patient and/or persons authorized to consent for the patient, the purpose of this procedure and the potential benefits and risks involved with this procedure.  The benefits include less needle sticks, lab draws from the catheter, and the patient may be discharged home with the catheter. Risks include, but not limited to, infection, bleeding, blood clot (thrombus formation), and puncture of an artery; nerve damage and irregular heartbeat and possibility to perform a PICC exchange if needed/ordered by physician.  Alternatives to this procedure were also discussed.  Bard Power PICC patient education guide, fact sheet on infection prevention and patient information card has been provided to patient /or left at bedside.  PICC placed by Christella Noa, RN.  Consent obtained from daughter due to altered mental status.  PICC Placement Documentation  PICC Double Lumen 94/07/68 Left Basilic 42 cm 0 cm (Active)  Indication for Insertion or Continuance of Line Prolonged intravenous therapies 08/09/21 1502  Exposed Catheter (cm) 0 cm 08/09/21 1502  Site Assessment Clean;Dry;Intact 08/09/21 1502  Lumen #1 Status Flushed;Saline locked;Blood return noted 08/09/21 1502  Lumen #2 Status Flushed;Saline locked;Blood return noted 08/09/21 1502  Dressing Type Transparent;Securing device 08/09/21 1502  Dressing Status Antimicrobial disc in place;Clean, Dry, Intact 08/09/21 1502  Safety Lock Not Applicable 08/81/10 3159  Line Care Connections checked and tightened 08/09/21 1502  Line Adjustment (NICU/IV Team Only) No 08/09/21 1502  Dressing Intervention New dressing 08/09/21 1502  Dressing Change Due 08/16/21 08/09/21 [place], [name] 08/09/2021, 3:03 PM

## 2021-08-09 NOTE — Progress Notes (Signed)
[facility] ADULT ICU REPLACEMENT PROTOCOL   The patient does apply for the [facility] Adult ICU Electrolyte Replacment Protocol based on the criteria listed below:   1.Exclusion criteria: TCTS patients, ECMO patients, and Dialysis patients 2. Is GFR >/= 30 ml/min? Yes.    Patient's GFR today is >60 3. Is SCr </= 2? Yes.   Patient's SCr is 0.84 mg/dL 4. Did SCr increase >/= 0.5 in 24 hours? No. 5.Pt's weight >40kg  Yes.   6. Abnormal electrolyte(s): K+ 3.3  7. Electrolytes replaced per protocol 8.  Call MD STAT for K+ </= 2.5, Phos </= 1, or Mag </= 1 Physician:  Randie Heinz 08/09/2021 5:55 AM

## 2021-08-09 NOTE — Consult Note (Signed)
   [facility] Inpatient Consult   08/09/2021  Darien Mignogna Locicero 12-13-1945 [id number]  [place] Organization [ACO] Patient: [facility] Medicare  Patient showing active with 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  Patient is in ICU and on the ventilator.    Plan: Will update [facility] RNCM of admission and for ongoing plans.  Natividad Brood, RN BSN [facility] Liaison  [phone] business mobile phone Toll free office [phone]  Fax number: [phone] [email or web address]

## 2021-08-09 NOTE — Progress Notes (Signed)
[place] Progress Note Patient Name: Courtney Goodman DOB: 1945/08/07 MRN: [id number]   Date of Service  08/09/2021  HPI/Events of Note  Hypocalcemia - Ca++ = 5.9 which corrects to 7.9 (Low) given Albumin < 1.5.  eICU Interventions  Will replace Ca++.     Intervention Category Major Interventions: Electrolyte abnormality - evaluation and management  Courtney Goodman 08/09/2021, 5:59 AM

## 2021-08-09 NOTE — Progress Notes (Signed)
NAME:  Courtney Goodman, MRN:  [id number], DOB:  01/18/1946, LOS: 5 ADMISSION DATE:  07/29/2021, CONSULTATION DATE:  08/04/21 REFERRING MD:  Dr. Armandina Gemma, ER, CHIEF COMPLAINT:  AMS   History of Present Illness:  76 yo female former smoker sent to ER with AMS and new Lt facial droop.  She was in hospital from 05/31/21 to 06/06/21 for AMS, septic shock with Klebsiella bacteremia, hypodense lesion in pancreatic head, and uncontrolled diabetes.  Reported to have fever at home.  Intubated in ER.  Started on ABx and pressors.  Neuro consulted in ER.  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  CT chest/abd/pelvis showed bronchiectasis with mucus plugging and basilar consolidation, improved appearance of pancreatic head, stool balls in rectum, and increasing lytic lesion in superior endplate wedge deformity of L1 with gas concerning for osteomyelitis. Found to have worsening anemia and PRBC ordered.  EEG and MRI imaging ordered.  PCCM asked to arrange for admission to ICU.  Hx from chart and medical team.  Pertinent  Medical History  DM type 2, HTN, HLD, CAD, CVA, Asthma/COPD, CKD 3, Anxiety with depression, Dementia  Significant Hospital Events: Including procedures, antibiotic start and stop dates in addition to other pertinent events   7/08 Admit, neuro consulted 7/9 Blood cultures positive for K.pneumoniae 7/8 Urine Cx + for > 100,000 colonies gram negative rods 7/11: platelets 8 transfused plt; off levo 7/13: EEG off; remains intubated  Interim History / Subjective:  Plt 21 Patient eyes open but not following commands; some withdrawal to painful stimuli; weak cough/gag; perrl Doing well on psv on mech vent  Objective   Blood pressure (!) 148/57, pulse 76, temperature (!) 97.5 F (36.4 C), temperature source Axillary, resp. rate (!) 21, height '5\' 5"'$  (1.651 m), weight 70.5 kg, SpO2 100 %. CVP:  [5 mmHg-29 mmHg] 15 mmHg  Vent Mode: PSV;CPAP FiO2 (%):  [40 %] 40 % Set Rate:  [16 bmp]  16 bmp Vt Set:  [450 mL] 450 mL PEEP:  [5 cmH20] 5 cmH20 Pressure Support:  [5 cmH20] 5 cmH20   Intake/Output Summary (Last 24 hours) at 08/09/2021 0725 Last data filed at 08/09/2021 0600 Gross per 24 hour  Intake 4025.74 ml  Output 1125 ml  Net 2900.74 ml    Filed Weights   08/07/21 0500 08/08/21 0600 08/09/21 0500  Weight: 64.6 kg 73.5 kg 70.5 kg    Examination:  General: critically ill appearing on mech vent HEENT: MM pink/moist; ETT in place Neuro: eyes open but not following commands; some withdrawal to painful stimuli; weak cough/gag; perrl CV: s1s2, RRR, no m/r/g PULM:  dim clear BS bilaterally; on mech vent PSV GI: soft, bsx4 active  Extremities: warm/dry, trace edema; pressure sore on bottom with bandage in place Skin: no rashes or lesions appreciated  Ancillary tests personally reviewed:   MTI Brain 7/10 Cervical spine:   1. No acute abnormality.  No osteomyelitis discitis. 2. Multilevel degenerative changes. Severe bilateral neuroforaminal stenosis at C3-C4.   Thoracic and lumbar spine:   1. 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 2. Small anterior epidural collection at T12 with left ventral epidural extension superiorly up to T9 suspicious for phlegmon and/or abscess. It is difficult to distinguish between the two without intravenous contrast. 3. 4.4 cm paraspinous fluid collection on the right at T11 and T12, suspicious for abscess. 4. Multilevel thoracolumbar spondylosis as described above. Moderate to  severe spinal canal and severe right neuroforaminal stenosis at L3-L4 Assessment & Plan:   Acute hypoxic respiratory failure with compromised airway Hx of COPD/asthma and bronchiectasis. P: -continue PSV trials during day as tolerated and rest on PRVC at night -will hold on extubation due to poor mental status -Wean PEEP/FiO2 for SpO2  >92% -VAP bundle in place -Daily SAT and SBT -PAD protocol in place -Follow intermittent CXR and ABG PRN -prn duoneb for wheezing  AMS with Lt facial droop. Hx of CVA, dementia, Anxiety, Depression. -MRI 7/9 no significant findings P: -neuro following appreciate recs -EEG off -limit sedating meds  Septic shock due to recurrent K.pneumoniae infection.  Concern for osteo - unclear source >> bacterial lower lobar pneumonia, UTI, and/or L1 spine abscess/ T 9  suspicious for phlegmon and/or abscess. Abscess would be a likely candidate for recurrent bacteremia.  P: -continue rocephin -follow cultures -repeat imaging per NSG -will need 6 weeks abx; PICC line needs to be placed  -will remove arterial line today  Hyperglycemia P: -cont ssi and cbg monitoring -continue levemir -cont TF coverage  Hypocalcemia Hypokalemia P: -K and calcium being repleted this am -trend and replete electrolytes as needed  Stool impaction. - disimpacted in ER P: -BR  -monitor for BM  Thrombocytopenia: no heparin; likely sepsis related Anemia of critical illness, possible GI bleed due to low HB and CG on NGT aspirate P: -trend CBC and transfuse as needed  AKI from shock. CKD 3b. P: -Trend BMP / urinary output -Replace electrolytes as indicated -Avoid nephrotoxic agents, ensure adequate renal perfusion  Severe protein calorie malnutrition. P: - Continue TF  Pressure injury Stage 3 of Sacrum, present on admission  P: -WOC following  Best Practice (right click and "Reselect all SmartList Selections" daily)   Diet/type: NPO - feeds  DVT prophylaxis: SCD  GI prophylaxis: PPI Lines: N/A Foley:  N/A Code Status:  full code Last date of multidisciplinary goals of care discussion [7/12 spoke with [name] and updated over phone; palliative following; [name] states that she would still like to continue full code and continue everything]  CRITICAL CARE  Total critical care time: 35   minutes  JD Rexene Agent [facility] 08/09/2021, 7:25 AM  Please see [email or web address] for pager details.  From 7A-7P if no response, please call [phone]. After hours, please call ELink [phone].

## 2021-08-09 NOTE — Progress Notes (Signed)
Nutrition Follow-up  DOCUMENTATION CODES:   Non-severe (moderate) malnutrition in context of chronic illness  INTERVENTION:   Pt is likely re-feeding. Recommendations as follows: Add thiamine 100 mg daily x 7 days Aggressive supplementation of phosphorus today supplement potassium today, check magnesium today and supplement if needed Monitor all electrolytes q 12 hours for least 3 days or more, supplement if low and continue until electrolytes stabilize.  Per ASPEN recommendations, ok to continue TF at goal rate as pt has been at goal for 4 days and started TF intervention 6 days ago.  However, given pt may be experiencing some symptoms of refeeding (poor neuro exam, overall weakness, diaphragmatic weakness/respiratory failure), consider cutting TF back by half for 24 hours but resuming at goal after 24 hours if electrolytes improving or as soon as possible  Add Juven BID, each packet provides 80 calories, 8 grams of carbohydrate, 2.5  grams of protein (collagen), 7 grams of L-arginine and 7 grams of L-glutamine; supplement contains CaHMB, Vitamins C, E, B12 and Zinc to promote wound healing   NUTRITION DIAGNOSIS:   Moderate Malnutrition related to chronic illness as evidenced by moderate fat depletion, moderate muscle depletion.  Being assessed  GOAL:   Patient will meet greater than or equal to 90% of their needs  Progressing  MONITOR:   Vent status, Labs, Weight trends, TF tolerance  REASON FOR ASSESSMENT:   Consult, Ventilator Enteral/tube feeding initiation and management  ASSESSMENT:   76 yo female admitted with septic shock due to Klebsiella bacteremia from lumbar osteomyelitis and paraspinal abscess from prior UTI, acute respiratory failure and acute toxic encephalopathy requiring intubation. PMH includes DM, HTN, HLD, CAD, CVA, asthma, COPD, CKD 3, dementia  Received consult for PCCM regarding possible re-feeding  Pt remains on vent support, Off sedation, having a  hard time waking up. No pressors. Unable to wean from vent due to weakness and poor mental status  Vital AF 1.2 at 60 ml/hr currently via Cortrak.   Pt meets criteria for malnutrition based on exam, putting pt at risk for refeeding. TF started on 7/08 via Adult TF protocol-Vital High Protein at 40 ml/hr. RD modified TF on 7/10 to Vital AF 1.2 at 60. At time of assessment on 7/10, electrolyte were being checked q 12 hours per adult TF protocol. Phosphorus was wdl except on last dray on 7/10 which was 2.4. Phosphorus not checked again until this morning and resulted as 1.3. Potassium also low. Noted magnesium last checked on 7/11, will add mag on to labs from this AM.   It is very possible that pt is refeeding; it is also possible that if pt is refeeding, that some of her presentation may be related to this including her poor neuro status, overall weakness, diaphragmatic weakness. Etc. Recommendations discussed in detail with CCM.   Current wt 70.5 kg; down from 73.5 kg yesterday. Admit wt 64.6 kg. Net + 14 L per I/OF flow sheet.   Labs: phosphorus 1.3 (L), potassium 3.3 (L), CBGs 92-140, albumin <1.5, estimated corrected calcium >/= 8.0 Meds: colace, ss novolog, levemir, novolog q 4 hours, KCl, potassium phosphate  Diet Order:   Diet Order             Diet NPO time specified  Diet effective now                   EDUCATION NEEDS:   Not appropriate for education at this time  Skin:  Skin Assessment: Skin Integrity Issues: Skin Integrity  Issues:: Stage III Stage III: coccyx  Last BM:  7/09  Height:   Ht Readings from Last 1 Encounters:  08/09/21 '5\' 5"'$  (1.651 m)    Weight:   Wt Readings from Last 1 Encounters:  08/09/21 70.5 kg    BMI:  Body mass index is 25.86 kg/m.  Estimated Nutritional Needs:   Kcal:  1600-1800 kcals  Protein:  90-110 g  Fluid:  >/= 1.6 L  Kerman Passey MS, RDN, LDN, CNSC Registered Dietitian 3 Clinical Nutrition RD Pager and On-Call Pager  Number Located in [place]

## 2021-08-10 ENCOUNTER — Encounter (HOSPITAL_COMMUNITY): Payer: Self-pay | Admitting: Pulmonary Disease

## 2021-08-10 DIAGNOSIS — J9601 Acute respiratory failure with hypoxia: Secondary | ICD-10-CM | POA: Diagnosis not present

## 2021-08-10 DIAGNOSIS — N183 Chronic kidney disease, stage 3 unspecified: Secondary | ICD-10-CM

## 2021-08-10 DIAGNOSIS — R7881 Bacteremia: Secondary | ICD-10-CM

## 2021-08-10 DIAGNOSIS — G9340 Encephalopathy, unspecified: Secondary | ICD-10-CM | POA: Diagnosis not present

## 2021-08-10 DIAGNOSIS — M4620 Osteomyelitis of vertebra, site unspecified: Secondary | ICD-10-CM | POA: Diagnosis present

## 2021-08-10 DIAGNOSIS — J9600 Acute respiratory failure, unspecified whether with hypoxia or hypercapnia: Secondary | ICD-10-CM

## 2021-08-10 LAB — BASIC METABOLIC PANEL
Anion gap: 10 (ref 5–15)
Anion gap: 7 (ref 5–15)
BUN: 81 mg/dL — ABNORMAL HIGH (ref 8–23)
BUN: 89 mg/dL — ABNORMAL HIGH (ref 8–23)
CO2: 18 mmol/L — ABNORMAL LOW (ref 22–32)
CO2: 19 mmol/L — ABNORMAL LOW (ref 22–32)
Calcium: 9.6 mg/dL (ref 8.9–10.3)
Calcium: 9.2 mg/dL (ref 8.9–10.3)
Chloride: 117 mmol/L — ABNORMAL HIGH (ref 98–111)
Chloride: 117 mmol/L — ABNORMAL HIGH (ref 98–111)
Creatinine, Ser: 1.31 mg/dL — ABNORMAL HIGH (ref 0.44–1.00)
Creatinine, Ser: 1.38 mg/dL — ABNORMAL HIGH (ref 0.44–1.00)
GFR, Estimated: 42 mL/min — ABNORMAL LOW (ref >60)
GFR, Estimated: 40 mL/min — ABNORMAL LOW (ref >60)
Glucose, Bld: 81 mg/dL (ref 70–99)
Glucose, Bld: 118 mg/dL — ABNORMAL HIGH (ref 70–99)
Potassium: 5.9 mmol/L — ABNORMAL HIGH (ref 3.5–5.1)
Potassium: 5.9 mmol/L — ABNORMAL HIGH (ref 3.5–5.1)
Sodium: 145 mmol/L (ref 135–145)
Sodium: 143 mmol/L (ref 135–145)

## 2021-08-10 LAB — COMPREHENSIVE METABOLIC PANEL
ALT: 51 U/L — ABNORMAL HIGH (ref 0–44)
AST: 61 U/L — ABNORMAL HIGH (ref 15–41)
Albumin: <1.5 g/dL — ABNORMAL LOW (ref 3.5–5.0)
Alkaline Phosphatase: 112 U/L (ref 38–126)
Anion gap: 5 (ref 5–15)
BUN: 78 mg/dL — ABNORMAL HIGH (ref 8–23)
CO2: 17 mmol/L — ABNORMAL LOW (ref 22–32)
Calcium: 9.3 mg/dL (ref 8.9–10.3)
Chloride: 120 mmol/L — ABNORMAL HIGH (ref 98–111)
Creatinine, Ser: 1.34 mg/dL — ABNORMAL HIGH (ref 0.44–1.00)
GFR, Estimated: 41 mL/min — ABNORMAL LOW (ref >60)
Glucose, Bld: 95 mg/dL (ref 70–99)
Potassium: 6.4 mmol/L (ref 3.5–5.1)
Sodium: 142 mmol/L (ref 135–145)
Total Bilirubin: 3 mg/dL — ABNORMAL HIGH (ref 0.3–1.2)
Total Protein: 5.2 g/dL — ABNORMAL LOW (ref 6.5–8.1)

## 2021-08-10 LAB — CBC
HCT: 22.5 % — ABNORMAL LOW (ref 36.0–46.0)
Hemoglobin: 7.6 g/dL — ABNORMAL LOW (ref 12.0–15.0)
MCH: 29 pg (ref 26.0–34.0)
MCHC: 33.8 g/dL (ref 30.0–36.0)
MCV: 85.9 fL (ref 80.0–100.0)
Platelets: 37 10*3/uL — ABNORMAL LOW (ref 150–400)
RBC: 2.62 MIL/uL — ABNORMAL LOW (ref 3.87–5.11)
RDW: 18.9 % — ABNORMAL HIGH (ref 11.5–15.5)
WBC: 25.1 10*3/uL — ABNORMAL HIGH (ref 4.0–10.5)
nRBC: 0.1 % (ref 0.0–0.2)

## 2021-08-10 LAB — GLUCOSE, CAPILLARY
Glucose-Capillary: 88 mg/dL (ref 70–99)
Glucose-Capillary: 341 mg/dL — ABNORMAL HIGH (ref 70–99)
Glucose-Capillary: 91 mg/dL (ref 70–99)
Glucose-Capillary: 86 mg/dL (ref 70–99)
Glucose-Capillary: 104 mg/dL — ABNORMAL HIGH (ref 70–99)
Glucose-Capillary: 120 mg/dL — ABNORMAL HIGH (ref 70–99)
Glucose-Capillary: 132 mg/dL — ABNORMAL HIGH (ref 70–99)

## 2021-08-10 LAB — MAGNESIUM
Magnesium: 2.9 mg/dL — ABNORMAL HIGH (ref 1.7–2.4)
Magnesium: 2.4 mg/dL (ref 1.7–2.4)
Magnesium: 2.1 mg/dL (ref 1.7–2.4)

## 2021-08-10 LAB — PHOSPHORUS
Phosphorus: 3.5 mg/dL (ref 2.5–4.6)
Phosphorus: 4 mg/dL (ref 2.5–4.6)
Phosphorus: 4 mg/dL (ref 2.5–4.6)

## 2021-08-10 MED ORDER — SODIUM PHOSPHATES 45 MMOLE/15ML IV SOLN
15.0000 mmol | Freq: Once | INTRAVENOUS | Status: AC
  Administered 2021-08-10: 15 mmol via INTRAVENOUS
  Filled 2021-08-10: qty 5

## 2021-08-10 MED ORDER — CALCIUM GLUCONATE-NACL 1-0.675 GM/50ML-% IV SOLN
1.0000 g | Freq: Once | INTRAVENOUS | Status: AC
  Administered 2021-08-10: 1000 mg via INTRAVENOUS
  Filled 2021-08-10: qty 50

## 2021-08-10 MED ORDER — MAGNESIUM SULFATE 4 GM/100ML IV SOLN
4.0000 g | Freq: Once | INTRAVENOUS | Status: AC
  Administered 2021-08-10: 4 g via INTRAVENOUS
  Filled 2021-08-10: qty 100

## 2021-08-10 MED ORDER — SODIUM ZIRCONIUM CYCLOSILICATE 10 G PO PACK
10.0000 g | PACK | ORAL | Status: DC
Start: 2021-08-10 — End: 2021-08-10

## 2021-08-10 MED ORDER — SODIUM ZIRCONIUM CYCLOSILICATE 10 G PO PACK
10.0000 g | PACK | Freq: Once | ORAL | Status: AC
  Administered 2021-08-10: 10 g
  Filled 2021-08-10: qty 1

## 2021-08-10 MED ORDER — CALCIUM GLUCONATE 10 % IV SOLN
1.0000 g | Freq: Once | INTRAVENOUS | Status: DC
Start: 2021-08-10 — End: 2021-08-10

## 2021-08-10 MED ORDER — DEXTROSE 50 % IV SOLN
1.0000 | Freq: Once | INTRAVENOUS | Status: AC
Start: 2021-08-10 — End: 2021-08-10
  Administered 2021-08-10: 50 mL via INTRAVENOUS
  Filled 2021-08-10: qty 50

## 2021-08-10 MED ORDER — ORAL CARE MOUTH RINSE
15.0000 mL | OROMUCOSAL | Status: DC
Start: 2021-08-10 — End: 2021-08-15
  Administered 2021-08-10 – 2021-08-15 (×59): 15 mL via OROMUCOSAL

## 2021-08-10 MED ORDER — SODIUM BICARBONATE 8.4 % IV SOLN
100.0000 meq | Freq: Once | INTRAVENOUS | Status: AC
  Administered 2021-08-10: 100 meq via INTRAVENOUS
  Filled 2021-08-10: qty 50

## 2021-08-10 MED ORDER — INSULIN ASPART 100 UNIT/ML IV SOLN
10.0000 [IU] | Freq: Once | INTRAVENOUS | Status: AC
Start: 2021-08-10 — End: 2021-08-10
  Administered 2021-08-10: 10 [IU] via INTRAVENOUS

## 2021-08-10 MED ORDER — HYDRALAZINE HCL 20 MG/ML IJ SOLN
10.0000 mg | INTRAMUSCULAR | Status: DC | PRN
  Administered 2021-08-10 – 2021-08-18 (×8): 10 mg via INTRAVENOUS
  Filled 2021-08-10 (×9): qty 1

## 2021-08-10 MED ORDER — ORAL CARE MOUTH RINSE: 15.0000 mL | OROMUCOSAL | Status: DC | PRN

## 2021-08-10 MED ORDER — SODIUM ZIRCONIUM CYCLOSILICATE 10 G PO PACK
10.0000 g | PACK | Freq: Once | ORAL | Status: AC
Start: 2021-08-10 — End: 2021-08-10
  Administered 2021-08-10: 10 g
  Filled 2021-08-10: qty 1

## 2021-08-10 NOTE — Progress Notes (Signed)
   Complete physical exam  Patient: Courtney Goodman   DOB: 11/17/1998   76 y.o. Female  MRN: [id number]  Subjective:    No chief complaint on file.   Courtney Goodman is a 76 y.o. female who presents today for a complete physical exam. She reports consuming a {diet types:17450} diet. {types:19826} She generally feels {DESC; WELL/FAIRLY WELL/POORLY:18703}. She reports sleeping {DESC; WELL/FAIRLY WELL/POORLY:18703}. She {does/does not:200015} have additional problems to discuss today.    Most recent fall risk assessment:    07/25/2021   10:42 AM  Fall Risk   Falls in the past year? 0  Number falls in past yr: 0  Injury with Fall? 0  Risk for fall due to : No Fall Risks  Follow up Falls evaluation completed     Most recent depression screenings:    07/25/2021   10:42 AM 06/15/2020   10:46 AM  PHQ 2/9 Scores  PHQ - 2 Score 0 0  PHQ- 9 Score 5     {VISON DENTAL STD PSA (Optional):27386}  {History (Optional):23778}  Patient Care Team: Jessup, Joy, NP as PCP - General (Nurse Practitioner)   Outpatient Medications Prior to Visit  Medication Sig   fluticasone (FLONASE) 50 MCG/ACT nasal spray Place 2 sprays into both nostrils in the morning and at bedtime. After 7 days, reduce to once daily.   norgestimate-ethinyl estradiol (SPRINTEC 28) 0.25-35 MG-MCG tablet Take 1 tablet by mouth daily.   Nystatin POWD Apply liberally to affected area 2 times per day   spironolactone (ALDACTONE) 100 MG tablet Take 1 tablet (100 mg total) by mouth daily.   No facility-administered medications prior to visit.    ROS        Objective:     There were no vitals taken for this visit. {Vitals History (Optional):23777}  Physical Exam   No results found for any visits on 08/30/21. {Show previous labs (optional):23779}    Assessment & Plan:    Routine Health Maintenance and Physical Exam  Immunization History  Administered Date(s) Administered   DTaP 01/31/1999, 03/29/1999,  06/07/1999, 02/21/2000, 09/06/2003   Hepatitis A 07/03/2007, 07/08/2008   Hepatitis B 11/18/1998, 12/26/1998, 06/07/1999   HiB (PRP-OMP) 01/31/1999, 03/29/1999, 06/07/1999, 02/21/2000   IPV 01/31/1999, 03/29/1999, 11/26/1999, 09/06/2003   Influenza,inj,Quad PF,6+ Mos 10/08/2013   Influenza-Unspecified 01/08/2012   MMR 11/25/2000, 09/06/2003   Meningococcal Polysaccharide 07/08/2011   Pneumococcal Conjugate-13 02/21/2000   Pneumococcal-Unspecified 06/07/1999, 08/21/1999   Tdap 07/08/2011   Varicella 11/26/1999, 07/03/2007    Health Maintenance  Topic Date Due   HIV Screening  Never done   Hepatitis C Screening  Never done   INFLUENZA VACCINE  08/28/2021   PAP-Cervical Cytology Screening  08/30/2021 (Originally 11/17/2019)   PAP SMEAR-Modifier  08/30/2021 (Originally 11/17/2019)   TETANUS/TDAP  08/30/2021 (Originally 07/07/2021)   HPV VACCINES  Discontinued   COVID-19 Vaccine  Discontinued    Discussed health benefits of physical activity, and encouraged her to engage in regular exercise appropriate for her age and condition.  Problem List Items Addressed This Visit   None Visit Diagnoses     Annual physical exam    -  Primary   Cervical cancer screening       Need for Tdap vaccination          No follow-ups on file.     Joy Jessup, NP   

## 2021-08-10 NOTE — Progress Notes (Signed)
IVT consult placed due to inability to flush or get blood return. Primary RN changed caps and repeatedly flushed without success. IVT changed caps again, flushed lines and was able to get blood return after initial sluggish return. Labs were drawn from PICC without issue. Instructed RN to place a new consult if PICC becomes difficult to flush or get blood return again.   Bessie Livingood Lorita Officer, RN

## 2021-08-10 NOTE — Progress Notes (Addendum)
NAME:  Courtney Goodman, MRN:  [id number], DOB:  1946-01-16, LOS: 6 ADMISSION DATE:  08/23/2021, CONSULTATION DATE:  08/04/21 REFERRING MD:  Dr. Armandina Gemma, ER, CHIEF COMPLAINT:  AMS   History of Present Illness:  76 yo female former smoker sent to ER with AMS and new Lt facial droop.  She was in hospital from 05/31/21 to 06/06/21 for AMS, septic shock with Klebsiella bacteremia, hypodense lesion in pancreatic head, and uncontrolled diabetes.  Reported to have fever at home.  Intubated in ER.  Started on ABx and pressors.  Neuro consulted in ER.  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  CT chest/abd/pelvis showed bronchiectasis with mucus plugging and basilar consolidation, improved appearance of pancreatic head, stool balls in rectum, and increasing lytic lesion in superior endplate wedge deformity of L1 with gas concerning for osteomyelitis. Found to have worsening anemia and PRBC ordered.  EEG and MRI imaging ordered.  PCCM asked to arrange for admission to ICU.  Hx from chart and medical team.  Pertinent  Medical History  DM type 2, HTN, HLD, CAD, CVA, Asthma/COPD, CKD 3, Anxiety with depression, Dementia  Significant Hospital Events: Including procedures, antibiotic start and stop dates in addition to other pertinent events   7/08 Admit, neuro consulted 7/9 Blood cultures positive for K.pneumoniae 7/8 Urine Cx + for > 100,000 colonies gram negative rods 7/10 Thoracic and lumbar spine: 1. 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 2. Small anterior epidural collection at T12 with left ventral epidural extension superiorly up to T9 suspicious for phlegmon and/or abscess. It is difficult to distinguish between the two without intravenous contrast. 3. 4.4 cm paraspinous fluid collection on the right at T11 and T12, suspicious for abscess. 4. Multilevel thoracolumbar spondylosis as  described above. Moderate to severe spinal canal and severe right neuroforaminal stenosis at L3-L4 7/11: platelets 8 transfused plt; off levo 7/13: EEG off; remains intubated. PLTs 21K. Weaning but mental status still barrier to extubation  Interim History / Subjective:  Weaning but still very weak  Objective   Blood pressure 135/60, pulse 88, temperature 98.2 F (36.8 C), temperature source Axillary, resp. rate 16, height '5\' 5"'$  (1.651 m), weight 74.2 kg, SpO2 100 %.    Vent Mode: CPAP;PSV FiO2 (%):  [40 %] 40 % PEEP:  [5 cmH20] 5 cmH20 Pressure Support:  [5 cmH20] 5 cmH20   Intake/Output Summary (Last 24 hours) at 08/10/2021 0934 Last data filed at 08/10/2021 0800 Gross per 24 hour  Intake 1816.92 ml  Output 1495 ml  Net 321.92 ml   Filed Weights   08/08/21 0600 08/09/21 0500 08/10/21 0500  Weight: 73.5 kg 70.5 kg 74.2 kg    Examination: General: 76 year old female patient currently lying in bed minimally responsive remains on ventilatory support HEENT normocephalic atraumatic no jugular venous distention orally intubated Pulmonary: Diminished bases, currently tidal volume in the 5-600 range on pressure support of 5 Cardiac: Regular rate and rhythm Abdomen: Soft nontender Extremities: Warm dry diffuse anasarca pulses strong Neuro: Awake, will stick her tongue out, cannot get her to move extremities, diffusely weak. GU: Clear yellow.  Resolved medical issues:   Stool impaction Septic shock resolved Assessment & Plan:   Active Problems:   Klebsiella pneumoniae sepsis ([place])   Osteomyelitis of vertebra, multiple sites in spine ([place])   Bacteremia due to Gram-negative bacteria   Vascular dementia without behavioral disturbance, psychotic disturbance, mood disturbance, or  anxiety ([place])   Encephalopathy acute due to sepsis   Pneumonia due to infectious organism   Acute respiratory failure (HCC)   CKD (chronic kidney disease), stage III (HCC)   Mixed hyperlipidemia due to  type 2 diabetes mellitus (HCC)   Pressure injury of skin   Malnutrition of moderate degree   Multi-focal acute Toxic and metabolic encephalopathy (sepsis w/ possible CNS involvement) superimposed on Hx of CVA,advanced dementia, Anxiety, Depression. -left facial droop and gaze pref Plan Limit sedation  Supportive care  Sepsis due to recurrent K.pneumoniae bacteremia with Spinal osteomyelitis and phlegmon formation.  -Reviewed with neurosurgery on admission day, deemed not a surgical candidate due to severe thrombocytopenia Plan Cont IV Ceftriaxone w/ plan to treat for 6 weeks total  We will await to see what results goals of care discussion are.  If continued ongoing support requested by family we we will need to have ID involvement for long-term antibiotic management  Acute hypoxic respiratory failure with compromised airway Hx of COPD/asthma and bronchiectasis. Plan Continue daily pressure support VAP bundle Daily assessment for extubation RASS goal 0 She would likely need tracheostomy/PEG tube however prognosis for full recovery very poor need to have goals of care discussion first  Intermittent fluid and electrolyte balance: Hyperkalemia, with marked increase compared to yesterday, none anion gap metabolic acidosis. Not convinced that potassium is accurate Plan Repeat blood chemistry Repeat electrolyte resolved medical issues  Thrombocytopenia: no heparin; likely sepsis related Slowly improving Plan No heparin Daily CBCs Treat infection  Anemia of critical illness, possible GI bleed due to low HB and CG on NGT aspirate Plan Trend CBC Trigger for  transfusion less than 7  Acute on chronic renal failure secondary to sepsis/septic shock CKD 3b. Plan Continue to trend chemistries Keep euvolemic MAP goal greater than 65 Renal dose medications  Severe protein calorie malnutrition. Plan Continue tube feeds  Hyperglycemia Plan Continue sliding scale insulin We  will glucose 140-180 No change in current Levemir  Pressure injury Stage 3 of Sacrum, present on admission  Plan WOC following  Best Practice (right click and "Reselect all SmartList Selections" daily)   Diet/type: NPO - feeds  DVT prophylaxis: SCD  GI prophylaxis: PPI Lines: N/A Foley:  N/A Code Status:  full code Last date of multidisciplinary goals of care discussion [7/12 spoke with [name] and updated over phone; palliative following; [name] states that she would still like to continue full code and continue everything]  CRITICAL CARE 34 min  Erick Colace ACNP-BC [place] Pager # [phone] OR # [phone] if no answer

## 2021-08-10 NOTE — Progress Notes (Signed)
eLink Physician-Brief Progress Note Patient Name: Courtney Goodman DOB: 1945-08-30 MRN: [id number]   Date of Service  08/10/2021  HPI/Events of Note  Multiple issues: 1. Hypertension - BP = 165/69. 2. Na+ = 152 and K+ = 5.3. Patient received several runs of K+ replacement today which could explain the elevated K+. Will need to repeat Na+ as it is much higher than the previous determination. 3. PO4--- = 2.3.  eICU Interventions  Plan: Hydralazine 10 mg IV Q 4 hours PRN SBP > 160 or DBP > 100. Will replace PO4---. Repeat BMP at 6 AM.      Intervention Category Major Interventions: Electrolyte abnormality - evaluation and management;Hypertension - evaluation and management  Lysle Dingwall 08/10/2021, 2:47 AM

## 2021-08-10 NOTE — Progress Notes (Addendum)
[facility] Progress Note Patient Name: Courtney Goodman DOB: February 02, 1945 MRN: [id number]   Date of Service  08/10/2021  HPI/Events of Note  Hyperkalemia - K+ = 6.4.   eICU Interventions  Plan: Lokelma 10 gm per tube x 1. D50 1 amp IV now. Novolog insulin 10 units IV now. NaHCO3 100 meq IV now. Calcium Gluconate 1 gm IV now. Repeat BMP at 12 noon.      Intervention Category Major Interventions: Electrolyte abnormality - evaluation and management  Lysle Dingwall 08/10/2021, 6:37 AM

## 2021-08-11 DIAGNOSIS — G928 Other toxic encephalopathy: Secondary | ICD-10-CM | POA: Diagnosis not present

## 2021-08-11 DIAGNOSIS — Z9911 Dependence on respirator [ventilator] status: Secondary | ICD-10-CM

## 2021-08-11 DIAGNOSIS — J9601 Acute respiratory failure with hypoxia: Secondary | ICD-10-CM | POA: Diagnosis not present

## 2021-08-11 DIAGNOSIS — G9340 Encephalopathy, unspecified: Secondary | ICD-10-CM | POA: Diagnosis not present

## 2021-08-11 LAB — GLUCOSE, CAPILLARY
Glucose-Capillary: 163 mg/dL — ABNORMAL HIGH (ref 70–99)
Glucose-Capillary: 176 mg/dL — ABNORMAL HIGH (ref 70–99)
Glucose-Capillary: 237 mg/dL — ABNORMAL HIGH (ref 70–99)
Glucose-Capillary: 247 mg/dL — ABNORMAL HIGH (ref 70–99)
Glucose-Capillary: 264 mg/dL — ABNORMAL HIGH (ref 70–99)
Glucose-Capillary: 267 mg/dL — ABNORMAL HIGH (ref 70–99)

## 2021-08-11 LAB — BASIC METABOLIC PANEL
Anion gap: 8 (ref 5–15)
Anion gap: 10 (ref 5–15)
BUN: 91 mg/dL — ABNORMAL HIGH (ref 8–23)
BUN: 100 mg/dL — ABNORMAL HIGH (ref 8–23)
CO2: 19 mmol/L — ABNORMAL LOW (ref 22–32)
CO2: 19 mmol/L — ABNORMAL LOW (ref 22–32)
Calcium: 8.9 mg/dL (ref 8.9–10.3)
Calcium: 8.7 mg/dL — ABNORMAL LOW (ref 8.9–10.3)
Chloride: 117 mmol/L — ABNORMAL HIGH (ref 98–111)
Chloride: 116 mmol/L — ABNORMAL HIGH (ref 98–111)
Creatinine, Ser: 1.45 mg/dL — ABNORMAL HIGH (ref 0.44–1.00)
Creatinine, Ser: 1.36 mg/dL — ABNORMAL HIGH (ref 0.44–1.00)
GFR, Estimated: 37 mL/min — ABNORMAL LOW (ref >60)
GFR, Estimated: 40 mL/min — ABNORMAL LOW (ref >60)
Glucose, Bld: 195 mg/dL — ABNORMAL HIGH (ref 70–99)
Glucose, Bld: 270 mg/dL — ABNORMAL HIGH (ref 70–99)
Potassium: 5.4 mmol/L — ABNORMAL HIGH (ref 3.5–5.1)
Potassium: 5.9 mmol/L — ABNORMAL HIGH (ref 3.5–5.1)
Sodium: 144 mmol/L (ref 135–145)
Sodium: 145 mmol/L (ref 135–145)

## 2021-08-11 LAB — PHOSPHORUS
Phosphorus: 4.4 mg/dL (ref 2.5–4.6)
Phosphorus: 4.3 mg/dL (ref 2.5–4.6)

## 2021-08-11 LAB — CBC
HCT: 18.9 % — ABNORMAL LOW (ref 36.0–46.0)
Hemoglobin: 6.2 g/dL — CL (ref 12.0–15.0)
MCH: 28.7 pg (ref 26.0–34.0)
MCHC: 32.8 g/dL (ref 30.0–36.0)
MCV: 87.5 fL (ref 80.0–100.0)
Platelets: 65 10*3/uL — ABNORMAL LOW (ref 150–400)
RBC: 2.16 MIL/uL — ABNORMAL LOW (ref 3.87–5.11)
RDW: 19.2 % — ABNORMAL HIGH (ref 11.5–15.5)
WBC: 23.5 10*3/uL — ABNORMAL HIGH (ref 4.0–10.5)
nRBC: 0 % (ref 0.0–0.2)

## 2021-08-11 LAB — MAGNESIUM
Magnesium: 2.1 mg/dL (ref 1.7–2.4)
Magnesium: 2 mg/dL (ref 1.7–2.4)

## 2021-08-11 LAB — PREPARE RBC (CROSSMATCH)

## 2021-08-11 MED ORDER — INSULIN ASPART 100 UNIT/ML IV SOLN
5.0000 [IU] | Freq: Once | INTRAVENOUS | Status: AC
  Administered 2021-08-11: 5 [IU] via INTRAVENOUS

## 2021-08-11 MED ORDER — SODIUM BICARBONATE 8.4 % IV SOLN
50.0000 meq | Freq: Once | INTRAVENOUS | Status: AC
Start: 2021-08-11 — End: 2021-08-11
  Administered 2021-08-11: 50 meq via INTRAVENOUS
  Filled 2021-08-11: qty 50

## 2021-08-11 MED ORDER — SODIUM ZIRCONIUM CYCLOSILICATE 10 G PO PACK
10.0000 g | PACK | Freq: Once | ORAL | Status: AC
Start: 2021-08-11 — End: 2021-08-11
  Administered 2021-08-11: 10 g
  Filled 2021-08-11: qty 1

## 2021-08-11 MED ORDER — SODIUM CHLORIDE 0.9% IV SOLUTION: Freq: Once | INTRAVENOUS | Status: AC

## 2021-08-11 MED ORDER — INSULIN DETEMIR 100 UNIT/ML ~~LOC~~ SOLN
8.0000 [IU] | Freq: Every day | SUBCUTANEOUS | Status: DC
  Administered 2021-08-11: 8 [IU] via SUBCUTANEOUS
  Filled 2021-08-11 (×2): qty 0.08

## 2021-08-11 MED ORDER — INSULIN ASPART 100 UNIT/ML IJ SOLN
1.0000 [IU] | INTRAMUSCULAR | Status: DC
  Administered 2021-08-11 (×3): 3 [IU] via SUBCUTANEOUS

## 2021-08-11 NOTE — Progress Notes (Signed)
[facility] Progress Note Patient Name: Courtney Goodman Armon DOB: 1945-05-07 MRN: [id number]   Date of Service  08/11/2021  HPI/Events of Note  K+ 5.9 from 3pm which has gone up since this AM  Already receiving Lokelma  eICU Interventions  Ordered dextrose/insulin/bicarb     Intervention Category Intermediate Interventions: Electrolyte abnormality - evaluation and management  Judd Lien 08/11/2021, 9:35 PM

## 2021-08-11 NOTE — Progress Notes (Signed)
Neurology progress note  Subjective: Patient seen and examined. Remains off sedation. More awake with eyes open although still not following commands  Exam: Vitals:   08/11/21 0700 08/11/21 0728  BP: (!) 143/63   Pulse: 75   Resp: 12   Temp:  98 F (36.7 C)  SpO2: 100%    Gen: In bed, NAD Resp: non-labored breathing, no acute distress, mechanically ventilated Abd: soft, nt  Neuro: Mental status/speech/language.  Eyes open, remains nonverbal, does not follow commands. Cranial: Pupils equal round react light, extraocular movements unhindered although she is leaning towards the right and looking more towards the right, blinks to threat inconsistently from both sides, difficult to ascertain facial symmetry. Motor examination: No spontaneous movement but to noxious stimulation attempts localization with both upper extremities.  Mild withdrawal in both lower extremities. Sensory exam: As above   Pertinent Labs: CBC    Component Value Date/Time   WBC 25.1 (H) 08/10/2021 0451   RBC 2.62 (L) 08/10/2021 0451   HGB 7.6 (L) 08/10/2021 0451   HGB 7.5 (L) 08/01/2021 0904   HGB 9.6 (L) 09/19/2020 1521   HCT 22.5 (L) 08/10/2021 0451   HCT 30.8 (L) 09/19/2020 1521   PLT 37 (L) 08/10/2021 0451   PLT 605 (H) 08/01/2021 0904   PLT 248 09/19/2020 1521   MCV 85.9 08/10/2021 0451   MCV 95 09/19/2020 1521   MCV 90 05/29/2011 1946   MCH 29.0 08/10/2021 0451   MCHC 33.8 08/10/2021 0451   RDW 18.9 (H) 08/10/2021 0451   RDW 14.0 09/19/2020 1521   RDW 14.7 (H) 05/29/2011 1946   LYMPHSABS 0.9 08/07/2021 0355   LYMPHSABS 1.9 05/07/2017 1147   MONOABS 1.3 (H) 08/07/2021 0355   EOSABS 0.0 08/07/2021 0355   EOSABS 0.4 05/07/2017 1147   BASOSABS 0.1 08/07/2021 0355   BASOSABS 0.0 05/07/2017 1147      Latest Ref Rng & Units 08/10/2021    6:13 PM 08/10/2021    9:47 AM 08/10/2021    4:51 AM  BMP  Glucose 70 - 99 mg/dL 118  81  95   BUN 8 - 23 mg/dL 89  81  78   Creatinine 0.44 - 1.00 mg/dL  1.38  1.31  1.34   Sodium 135 - 145 mmol/L 143  145  142   Potassium 3.5 - 5.1 mmol/L 5.9  5.9  6.4   Chloride 98 - 111 mmol/L 117  117  120   CO2 22 - 32 mmol/L '19  18  17   '$ Calcium 8.9 - 10.3 mg/dL 9.2  9.6  9.3    Imaging: MRI brain- A few small acute infarcts in left frontoparietal cortex. Remote right caudate and left thalamus lacunar infarcts. MRI C-spine- Multilevel degenerative changes. Severe bilateral neuroforaminal stenosis at C3-C4. MRI T-spine/L-spine- Findings consistent with osteomyelitis discitis at T12-L1 with pathologic fracture of the L1 vertebral body and bilateral paravertebral abscesses. Small anterior epidural collection at T12 with left ventral epidural extension superiorly up to T9 suspicious for phlegmon and/or abscess. It is difficult to distinguish between the two without intravenous contrast.4.4 cm paraspinous fluid collection on the right at T11 and T12, suspicious for abscess. Multilevel thoracolumbar spondylosis as described above. Moderate to severe spinal canal and severe right neuroforaminal stenosis at L3-L4.  EEG over a couple nights with generalized periodic discharges with triphasic morphology.  No evolution to seizures.  Continuous EEG was discontinued.  Assessment:  76 year old female with an extensive past medical history presenting with altered mental  status requiring intubation and subsequently found to have hyperglycemia, anemia with hypotension, leukocytosis (WBC 38) and lactic acidosis (6.9).  Spinal imaging shows osteomyelitis from T12-L1 and suspicion for an abscess at T9 and T11 and T12. Antibiotics were switched to Rocephin per CCM due to significant drop in platelets (38 -> 8).  Leukocytosis has improved to 20.4 and kidney function has improved with Cr at 0.84.  With her advanced age and debility along with current systemic issues, prognosis for a meaningful recovery from a neurological standpoint remains guarded to poor. Given Kleb bacteremia,  thought of CNS infection is also very possible and hence the prognosis as above.  She might have a prolonged course for return to her baseline mentation given her advanced age and comorbidities irrespective of what current systemic infections and derangements she has.  Compared to the exam from start of the week-she is reasonably improved with spontaneous eye opening and meaningful withdrawal to noxious stimulation although still not following commands.  Impression Multifactorial toxic metabolic encephalopathy with septic shock Possible CNS infection in the setting of Kleb bacteremia and osteo Multiple other comorbidities Continuing poor neurological exam   Recommendations: -Continue antibiotics and hydration per CCM -Spot EEG done after LTM EEG was discontinued due to concern for gaze preference was negative for seizures. -I suspect that current presentation is consistent with toxic metabolic encephalopathy in a person with poor brain reserve due to underlying dementia. -Cannot do antiplatelets or anticoagulants for incidental stroke seen on MRI due to her severe thrombocytopenia.  Can be considered as an outpatient if her condition clinically and laboratory wise improves.  Inpatient neurology will be available as needed-please call with questions.  -- Amie Portland, MD Neurologist [facility] Pager: [phone]   CRITICAL CARE ATTESTATION Performed by: Amie Portland, MD Total critical care time: 30 minutes Critical care time was exclusive of separately billable procedures and treating other patients and/or supervising APPs/Residents/Students Critical care was necessary to treat or prevent imminent or life-threatening deterioration due to toxic metabolic encephalopathy This patient is critically ill and at significant risk for neurological worsening and/or death and care requires constant monitoring. Critical care was time spent personally by me on the following  activities: development of treatment plan with patient and/or surrogate as well as nursing, discussions with consultants, evaluation of patient's response to treatment, examination of patient, obtaining history from patient or surrogate, ordering and performing treatments and interventions, ordering and review of laboratory studies, ordering and review of radiographic studies, pulse oximetry, re-evaluation of patient's condition, participation in multidisciplinary rounds and medical decision making of high complexity in the care of this patient.

## 2021-08-11 NOTE — Progress Notes (Signed)
eLink Physician-Brief Progress Note Patient Name: Jezreel Sisk Maves DOB: Mar 01, 1945 MRN: [id number]   Date of Service  08/11/2021  HPI/Events of Note  Notified of CBG > 200 x 2 and Hgb 6.2 earlier s/p 1 unit PRBC if a repeat H/H needed tonight  Tolerating tube feeds at goal Creatinine 1.36 No report of active bleed Hemodynamically stable, not on pressors  eICU Interventions  Ordered to start Levemir 8 No need to repeat H/H tonight. CBC already ordered for 5 am tomorrow Discussed with bedside RN     Intervention Category Intermediate Interventions: Hyperglycemia - evaluation and treatment;Other:  Judd Lien 08/11/2021, 8:30 PM

## 2021-08-11 NOTE — Progress Notes (Signed)
Date and time results received: 08/11/21 1600 (use smartphrase ".now" to insert current time)  Test: hgb Critical Value: 6.2  Name of Provider Notified: Noemi Chapel, DO  Orders Received? Or Actions Taken?:  type and screen ordered,  transfusion ordered.  Consuella Lose, RN

## 2021-08-11 NOTE — Progress Notes (Signed)
NAME:  Courtney Goodman, MRN:  [id number], DOB:  Jun 10, 1945, LOS: 7 ADMISSION DATE:  08/06/2021, CONSULTATION DATE:  08/04/21 REFERRING MD:  Dr. Armandina Gemma, ER, CHIEF COMPLAINT:  AMS   History of Present Illness:  76 yo female former smoker sent to ER with AMS and new Lt facial droop.  She was in hospital from 05/31/21 to 06/06/21 for AMS, septic shock with Klebsiella bacteremia, hypodense lesion in pancreatic head, and uncontrolled diabetes.  Reported to have fever at home.  Intubated in ER.  Started on ABx and pressors.  Neuro consulted in ER.  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  CT chest/abd/pelvis showed bronchiectasis with mucus plugging and basilar consolidation, improved appearance of pancreatic head, stool balls in rectum, and increasing lytic lesion in superior endplate wedge deformity of L1 with gas concerning for osteomyelitis. Found to have worsening anemia and PRBC ordered.  EEG and MRI imaging ordered.  PCCM asked to arrange for admission to ICU.  Hx from chart and medical team.  Pertinent  Medical History  DM type 2, HTN, HLD, CAD, CVA, Asthma/COPD, CKD 3, Anxiety with depression, Dementia  Significant Hospital Events: Including procedures, antibiotic start and stop dates in addition to other pertinent events   7/08 Admit, neuro consulted 7/9 Blood cultures positive for K.pneumoniae 7/8 Urine Cx + for > 100,000 colonies gram negative rods 7/10 Thoracic and lumbar spine: 1. 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 2. Small anterior epidural collection at T12 with left ventral epidural extension superiorly up to T9 suspicious for phlegmon and/or abscess. It is difficult to distinguish between the two without intravenous contrast. 3. 4.4 cm paraspinous fluid collection on the right at T11 and T12, suspicious for abscess. 4. Multilevel thoracolumbar spondylosis as  described above. Moderate to severe spinal canal and severe right neuroforaminal stenosis at L3-L4 7/11: platelets 8 transfused plt; off levo 7/13: EEG off; remains intubated. PLTs 21K. Weaning but mental status still barrier to extubation  Interim History / Subjective:  Remains on vent wean, still not following commands.  BM overnight.  Objective   Blood pressure (!) 143/63, pulse 71, temperature 98 F (36.7 C), temperature source Oral, resp. rate 13, height '5\' 5"'$  (1.651 m), weight 71.4 kg, SpO2 100 %.    Vent Mode: CPAP;PSV FiO2 (%):  [40 %] 40 % PEEP:  [5 cmH20] 5 cmH20 Pressure Support:  [5 cmH20] 5 cmH20   Intake/Output Summary (Last 24 hours) at 08/11/2021 0852 Last data filed at 08/11/2021 0700 Gross per 24 hour  Intake 1075.52 ml  Output 1615 ml  Net -539.48 ml    Filed Weights   08/09/21 0500 08/10/21 0500 08/11/21 0500  Weight: 70.5 kg 74.2 kg 71.4 kg    Examination: General: Critically ill-appearing elderly woman lying in bed in no acute distress, not sedated, on mechanical ventilation HEENT [place]/AT, eyes anicteric, ETT in place.  Oral mucosa moist. Pulmonary: Breathing comfortably on 5/5 with tidal volumes around 500 cc.  CTA B. Cardiac: S1-S2, regular rate and rhythm Abdomen: Soft, nontender, nondistended Extremities: Pitting edema in all extremities.  PICC in left upper extremity without erythema.  No cyanosis Neuro: RASS -1, not mimicking commands or following verbal commands.  Upper extremities contracted.  Grimaces with passive range of motion. GU: Amber  Potassium 5.4 BUN 91 Creatinine 1.45 CBC pending  Resolved medical issues:   Stool impaction Septic shock resolved Assessment & Plan:  Active Problems:   CKD (chronic kidney disease), stage III (HCC)   Mixed hyperlipidemia due to type 2 diabetes mellitus (HCC)   Klebsiella pneumoniae sepsis (HCC)   Vascular dementia without behavioral disturbance, psychotic disturbance, mood disturbance, or anxiety  (HCC)   Pressure injury of skin   Pneumonia due to infectious organism   Malnutrition of moderate degree   Osteomyelitis of vertebra, multiple sites in spine ([place])   Bacteremia due to Gram-negative bacteria   Encephalopathy acute due to sepsis   Acute respiratory failure (HCC)   Multi-focal acute metabolic encephalopathy (sepsis w/ CNS involvement) superimposed on h/o of CVA, advanced dementia, anxiety, depression. -Limit sedation and continue supportive care  Sepsis due to recurrent K.pneumoniae bacteremia with spinal osteomyelitis and phlegmon formation.  T9?-L1 epidural abscess, T12-L1 pathologic fracture with osteomyelitis and discitis.  Paraspinous fluid collection T11-T12 -Reviewed with neurosurgery on admission day, deemed not a surgical candidate due to severe thrombocytopenia Plan -Continue IV ceftriaxone -Very high risk for paraplegia long-term  Acute hypoxic respiratory failure with compromised airway Hx of COPD/asthma and bronchiectasis. -LTVV - Continue vent weaning efforts.  Difficult to determine how she would do postextubation with her lack of following commands. -PAD protocol for sedation-fentanyl as needed only - VAP prevention protocol - Needs ongoing goals of care discussions to help clarify wishes once she is extubated, which seems like it could be soon  Hyperkalemia NAGMA Anasarca, low protein stores Worsening azotemia -Lokelma -Continue to monitor - Albumin  Thrombocytopenia: likely sepsis related -Continue to hold chemical DVT prophylaxis - Monitor CBC - No current indication for transfusion  Anemia of critical illness, possible GI bleed due to low HB and CG on NGT aspirate -Transfuse for hemoglobin less than 7 or hemodynamically significant bleeding.  Acute on chronic renal failure secondary to sepsis/septic shock CKD 3b. -Albumin for volume repletion - No diuresis today - Renally dose meds and avoid nephrotoxic meds - Strict I's/O -  Continue to monitor  Severe protein calorie malnutrition -Continue tube feeds  Hyperglycemia -SSI as needed -Goal BG 140-180  Hyperglycemia Plan Continue sliding scale insulin We will glucose 140-180 No change in current Levemir  Pressure injury Stage 3 of Sacrum, present on admission  Plan -Wound care  Will call family today to update them on her care and clarify goals as we work towards extubation.  Best Practice (right click and "Reselect all SmartList Selections" daily)   Diet/type: tubefeeds  DVT prophylaxis: SCD  GI prophylaxis: PPI Lines: Central line and yes and it is still needed Foley:  Yes, and it is still needed Code Status:  full code Last date of multidisciplinary goals of care discussion [7/12 spoke with [name] and updated over phone; palliative following; [name] states that she would still like to continue full code and continue everything]  This patient is critically ill with multiple organ system failure which requires frequent high complexity decision making, assessment, support, evaluation, and titration of therapies. This was completed through the application of advanced monitoring technologies and extensive interpretation of multiple databases. During this encounter critical care time was devoted to patient care services described in this note for 36 minutes.  Julian Hy, DO 08/11/21 9:24 AM [facility]

## 2021-08-11 NOTE — IPAL (Signed)
  Interdisciplinary Goals of Care Family Meeting   Date carried out:: 08/11/2021  Location of the meeting: Bedside  Member's involved: Physician, Bedside Registered Nurse, and Family Member or next of kin  Durable Power of Attorney or acting medical decision maker: daughter Courtney Goodman     Discussion: We discussed goals of care for Courtney Goodman .  Courtney Goodman is able to tell me that her mother had declined significantly in her hospitalization for sepsis in May 2023.  She had not fully recovered to her baseline.  She was only minimally able to walk with a walker, basically in her bedroom with assistance.  She had not gotten back to where she was able to get around the house from room to room with her walker.  She understands my concern for progressive cognitive and physical decline this admission that may not be fully reversible again due to critical illness and baseline dementia.  I am also concerned that with her epidural abscesses she is at risk for paraplegia in her lower extremities.  Her mental status worries me that she may not protect her airway without an endotracheal tube.  We discussed that if she is unable to protect her airway after extubation a decision would have to be made regarding if she would want long-term tracheostomy.  This would likely require her to live in a facility.  Her daughter is going to think about this.  Code status: Full Code  Disposition: Continue current acute care   Time spent for the meeting: 10 min.  Julian Hy 08/11/2021, 11:16 AM

## 2021-08-12 DIAGNOSIS — Z9911 Dependence on respirator [ventilator] status: Secondary | ICD-10-CM | POA: Diagnosis not present

## 2021-08-12 DIAGNOSIS — R6520 Severe sepsis without septic shock: Secondary | ICD-10-CM

## 2021-08-12 DIAGNOSIS — G9340 Encephalopathy, unspecified: Secondary | ICD-10-CM | POA: Diagnosis not present

## 2021-08-12 DIAGNOSIS — A419 Sepsis, unspecified organism: Secondary | ICD-10-CM | POA: Diagnosis not present

## 2021-08-12 DIAGNOSIS — Z515 Encounter for palliative care: Secondary | ICD-10-CM

## 2021-08-12 DIAGNOSIS — J9601 Acute respiratory failure with hypoxia: Secondary | ICD-10-CM | POA: Diagnosis not present

## 2021-08-12 DIAGNOSIS — N179 Acute kidney failure, unspecified: Secondary | ICD-10-CM

## 2021-08-12 LAB — GLUCOSE, CAPILLARY
Glucose-Capillary: 103 mg/dL — ABNORMAL HIGH (ref 70–99)
Glucose-Capillary: 113 mg/dL — ABNORMAL HIGH (ref 70–99)
Glucose-Capillary: 117 mg/dL — ABNORMAL HIGH (ref 70–99)
Glucose-Capillary: 165 mg/dL — ABNORMAL HIGH (ref 70–99)
Glucose-Capillary: 177 mg/dL — ABNORMAL HIGH (ref 70–99)
Glucose-Capillary: 201 mg/dL — ABNORMAL HIGH (ref 70–99)

## 2021-08-12 LAB — TYPE AND SCREEN
ABO/RH(D): O POS
Antibody Screen: NEGATIVE
Unit division: 0

## 2021-08-12 LAB — CBC
HCT: 25.3 % — ABNORMAL LOW (ref 36.0–46.0)
Hemoglobin: 8.7 g/dL — ABNORMAL LOW (ref 12.0–15.0)
MCH: 29.3 pg (ref 26.0–34.0)
MCHC: 34.4 g/dL (ref 30.0–36.0)
MCV: 85.2 fL (ref 80.0–100.0)
Platelets: 81 10*3/uL — ABNORMAL LOW (ref 150–400)
RBC: 2.97 MIL/uL — ABNORMAL LOW (ref 3.87–5.11)
RDW: 17.4 % — ABNORMAL HIGH (ref 11.5–15.5)
WBC: 21.1 10*3/uL — ABNORMAL HIGH (ref 4.0–10.5)
nRBC: 0 % (ref 0.0–0.2)

## 2021-08-12 LAB — BASIC METABOLIC PANEL
Anion gap: 10 (ref 5–15)
BUN: 111 mg/dL — ABNORMAL HIGH (ref 8–23)
CO2: 20 mmol/L — ABNORMAL LOW (ref 22–32)
Calcium: 9.1 mg/dL (ref 8.9–10.3)
Chloride: 119 mmol/L — ABNORMAL HIGH (ref 98–111)
Creatinine, Ser: 1.44 mg/dL — ABNORMAL HIGH (ref 0.44–1.00)
GFR, Estimated: 38 mL/min — ABNORMAL LOW (ref >60)
Glucose, Bld: 100 mg/dL — ABNORMAL HIGH (ref 70–99)
Potassium: 4.7 mmol/L (ref 3.5–5.1)
Sodium: 149 mmol/L — ABNORMAL HIGH (ref 135–145)

## 2021-08-12 LAB — PHOSPHORUS
Phosphorus: 3.9 mg/dL (ref 2.5–4.6)
Phosphorus: 3.8 mg/dL (ref 2.5–4.6)

## 2021-08-12 LAB — MAGNESIUM: Magnesium: 2.2 mg/dL (ref 1.7–2.4)

## 2021-08-12 LAB — BPAM RBC
Blood Product Expiration Date: 202308152359
ISSUE DATE / TIME: 202307151658
Unit Type and Rh: 5100

## 2021-08-12 MED ORDER — ALBUMIN HUMAN 25 % IV SOLN
50.0000 g | Freq: Once | INTRAVENOUS | Status: AC
Start: 2021-08-12 — End: 2021-08-13
  Administered 2021-08-12: 50 g via INTRAVENOUS
  Filled 2021-08-12: qty 200

## 2021-08-12 MED ORDER — INSULIN DETEMIR 100 UNIT/ML ~~LOC~~ SOLN
12.0000 [IU] | Freq: Every day | SUBCUTANEOUS | Status: DC
  Administered 2021-08-12: 12 [IU] via SUBCUTANEOUS
  Filled 2021-08-12 (×2): qty 0.12

## 2021-08-12 MED ORDER — INSULIN ASPART 100 UNIT/ML IJ SOLN
3.0000 [IU] | INTRAMUSCULAR | Status: DC
Start: 2021-08-12 — End: 2021-08-13
  Administered 2021-08-12 (×2): 3 [IU] via SUBCUTANEOUS

## 2021-08-12 MED ORDER — HEPARIN SODIUM (PORCINE) 5000 UNIT/ML IJ SOLN
5000.0000 [IU] | Freq: Three times a day (TID) | INTRAMUSCULAR | Status: DC
  Administered 2021-08-12 – 2021-08-24 (×35): 5000 [IU] via SUBCUTANEOUS
  Filled 2021-08-12 (×36): qty 1

## 2021-08-12 MED ORDER — FREE WATER
200.0000 mL | Status: DC
  Administered 2021-08-12 – 2021-08-15 (×16): 200 mL

## 2021-08-12 MED ORDER — INSULIN ASPART 100 UNIT/ML IJ SOLN
0.0000 [IU] | INTRAMUSCULAR | Status: DC
  Administered 2021-08-12: 8 [IU] via SUBCUTANEOUS
  Administered 2021-08-12: 5 [IU] via SUBCUTANEOUS
  Administered 2021-08-12 (×2): 3 [IU] via SUBCUTANEOUS
  Administered 2021-08-13: 2 [IU] via SUBCUTANEOUS
  Administered 2021-08-13 – 2021-08-14 (×2): 3 [IU] via SUBCUTANEOUS
  Administered 2021-08-14 (×3): 5 [IU] via SUBCUTANEOUS
  Administered 2021-08-14: 8 [IU] via SUBCUTANEOUS
  Administered 2021-08-14: 2 [IU] via SUBCUTANEOUS
  Administered 2021-08-15: 3 [IU] via SUBCUTANEOUS
  Administered 2021-08-15 (×2): 2 [IU] via SUBCUTANEOUS
  Administered 2021-08-15: 3 [IU] via SUBCUTANEOUS
  Administered 2021-08-15 – 2021-08-16 (×3): 5 [IU] via SUBCUTANEOUS
  Administered 2021-08-16 (×3): 3 [IU] via SUBCUTANEOUS
  Administered 2021-08-16: 5 [IU] via SUBCUTANEOUS
  Administered 2021-08-17: 3 [IU] via SUBCUTANEOUS
  Administered 2021-08-17: 2 [IU] via SUBCUTANEOUS
  Administered 2021-08-17 – 2021-08-18 (×6): 3 [IU] via SUBCUTANEOUS
  Administered 2021-08-18: 5 [IU] via SUBCUTANEOUS
  Administered 2021-08-18 (×3): 3 [IU] via SUBCUTANEOUS
  Administered 2021-08-18: 5 [IU] via SUBCUTANEOUS
  Administered 2021-08-19: 2 [IU] via SUBCUTANEOUS
  Administered 2021-08-19: 3 [IU] via SUBCUTANEOUS
  Administered 2021-08-19 (×2): 2 [IU] via SUBCUTANEOUS
  Administered 2021-08-19: 3 [IU] via SUBCUTANEOUS
  Administered 2021-08-20 (×2): 2 [IU] via SUBCUTANEOUS
  Administered 2021-08-20: 3 [IU] via SUBCUTANEOUS
  Administered 2021-08-20: 5 [IU] via SUBCUTANEOUS
  Administered 2021-08-20: 2 [IU] via SUBCUTANEOUS
  Administered 2021-08-20: 8 [IU] via SUBCUTANEOUS
  Administered 2021-08-21 (×2): 5 [IU] via SUBCUTANEOUS
  Administered 2021-08-21: 8 [IU] via SUBCUTANEOUS
  Administered 2021-08-21 (×2): 5 [IU] via SUBCUTANEOUS
  Administered 2021-08-22: 3 [IU] via SUBCUTANEOUS
  Administered 2021-08-22 (×2): 2 [IU] via SUBCUTANEOUS
  Administered 2021-08-22: 5 [IU] via SUBCUTANEOUS
  Administered 2021-08-23: 3 [IU] via SUBCUTANEOUS
  Administered 2021-08-23: 2 [IU] via SUBCUTANEOUS
  Administered 2021-08-23: 3 [IU] via SUBCUTANEOUS
  Administered 2021-08-23: 2 [IU] via SUBCUTANEOUS
  Administered 2021-08-23: 3 [IU] via SUBCUTANEOUS
  Administered 2021-08-23: 2 [IU] via SUBCUTANEOUS

## 2021-08-12 NOTE — Progress Notes (Signed)
eLink Physician-Brief Progress Note Patient Name: Rachele Lamaster Therrell DOB: 1945/04/06 MRN: [id number]   Date of Service  08/12/2021  HPI/Events of Note  Notified of CBG 267 Received Levemir 8 an hour prior On low dose SSI  eICU Interventions  Increase SSI to moderate dose Reassess in am if patient will need additional Levemir dose     Intervention Category Intermediate Interventions: Hyperglycemia - evaluation and treatment  Shona Needles Mikka Kissner 08/12/2021, 12:15 AM

## 2021-08-12 NOTE — Progress Notes (Signed)
Patient ID: Jerolene Kupfer Frate, female   DOB: [birth year], 76 y.o.   MRN: [id number]    Progress Note from the Palliative Medicine Team at [facility]   Patient Name: Amritha Yorke Vandenberg        Date: 08/12/2021 DOB: 03/12/1945  Age: 76 y.o. MRN#: [id number] Attending Physician: Julian Hy, DO Primary Care Physician: [name] Admit Date: 07/29/2021   Medical records reviewed, discussed with bedside RN and assessed the patient.  76 y.o. female  admitted on 08/16/2021 through the ER with AMS and new Lt facial droop.  She was in hospital from 05/31/21 to 06/06/21 for AMS, septic shock with Klebsiella bacteremia, hypodense lesion in pancreatic head, and uncontrolled diabetes.  Reported to have fever at home.  Intubated in ER.  Started on ABx and pressors.  Neuro consulted in ER.  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  CT chest/abd/pelvis showed bronchiectasis with mucus plugging and basilar consolidation, improved appearance of pancreatic head, stool balls in rectum, and increasing lytic lesion in superior endplate wedge deformity of L1 with gas concerning for osteomyelitis. Found to have worsening anemia and PRBC ordered.  EEG and MRI imaging ordered.  Patient admitted for treatment stabilization.   Neuro consult note.  Blood cultures positive for k. Pneumonia, urine culture positive for greater than 100,000 gram-negative rods.   Patient currently intubated.    As noted patient with significant physical, functional and cognitive decline over the past many months.  Underlying dementia play significant role in patient's long-term outcome   Significant Hospital events  7/08 Admit, neuro consulted 7/9 Blood cultures positive for K.pneumoniae 7/8 Urine Cx + for > 100,000 colonies gram negative rods 7/10 Thoracic and lumbar spine: 1. 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  2. Small anterior epidural collection at T12 with left ventral epidural extension superiorly up to T9 suspicious for phlegmon and/or abscess. It is difficult to distinguish between the two without intravenous contrast. 3. 4.4 cm paraspinous fluid collection on the right at T11 and T12, suspicious for abscess. 4. Multilevel thoracolumbar spondylosis as described above. Moderate to severe spinal canal and severe right neuroforaminal stenosis at L3-L4 7/11: platelets 8 transfused plt; off levo 7/13: EEG off; remains intubated. PLTs 21K. Weaning but mental status still barrier to extubation     Family face treatment option decisions, advanced directive decisions and anticipatory care needs.   Noted conversation with critical care yesterday.  No family at bedside.  PMT will continue to support holistically.      Discussed with bedside RN--to contact me if family comes to the bedside and has questions or concerns for p.m. today.  No charge.  Wadie Lessen NP  Palliative Medicine Team Team Phone # [phone] Pager [phone]

## 2021-08-12 NOTE — Progress Notes (Signed)
Daughter updated at bedside. We reviewed again that her mother is not making significant strides and has severe baseline disability from dementia and previous sepsis. I am concerned that her mental status will not improve back to her previous baseline and that her swallowing and ability to manage her secretions will be impaired. I  worry she will not tolerate extubation, but we will not know until we decide. Her daughter wants to speak to her aunts tomorrow and hopefully extubate Tuesday. She has not told me if she has made a decision regarding if she would want trach, which I have informed her would most certainly mean her mother would require SNF and trach would likely be permanent due to her severe cognitive issues.   Additional cc time: 10 min.  Julian Hy, DO 08/12/21 3:24 PM [facility]

## 2021-08-12 NOTE — Progress Notes (Signed)
NAME:  Courtney Goodman, MRN:  [id number], DOB:  11-05-1945, LOS: 8 ADMISSION DATE:  08/24/2021, CONSULTATION DATE:  08/04/21 REFERRING MD:  Dr. Armandina Gemma, ER, CHIEF COMPLAINT:  AMS   History of Present Illness:  76 yo female former smoker sent to ER with AMS and new Lt facial droop.  She was in hospital from 05/31/21 to 06/06/21 for AMS, septic shock with Klebsiella bacteremia, hypodense lesion in pancreatic head, and uncontrolled diabetes.  Reported to have fever at home.  Intubated in ER.  Started on ABx and pressors.  Neuro consulted in ER.  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  CT chest/abd/pelvis showed bronchiectasis with mucus plugging and basilar consolidation, improved appearance of pancreatic head, stool balls in rectum, and increasing lytic lesion in superior endplate wedge deformity of L1 with gas concerning for osteomyelitis. Found to have worsening anemia and PRBC ordered.  EEG and MRI imaging ordered.  PCCM asked to arrange for admission to ICU.  Hx from chart and medical team.  Pertinent  Medical History  DM type 2, HTN, HLD, CAD, CVA, Asthma/COPD, CKD 3, Anxiety with depression, Dementia  Significant Hospital Events: Including procedures, antibiotic start and stop dates in addition to other pertinent events   7/08 Admit, neuro consulted 7/9 Blood cultures positive for K.pneumoniae 7/8 Urine Cx + for > 100,000 colonies gram negative rods 7/10 Thoracic and lumbar spine: 1. 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 2. Small anterior epidural collection at T12 with left ventral epidural extension superiorly up to T9 suspicious for phlegmon and/or abscess. It is difficult to distinguish between the two without intravenous contrast. 3. 4.4 cm paraspinous fluid collection on the right at T11 and T12, suspicious for abscess. 4. Multilevel thoracolumbar spondylosis as  described above. Moderate to severe spinal canal and severe right neuroforaminal stenosis at L3-L4 7/11: platelets 8 transfused plt; off levo 7/13: EEG off; remains intubated. PLTs 21K. Weaning but mental status still barrier to extubation  Interim History / Subjective:  No acute events overnight. 2 Bms overnight.  Objective   Blood pressure (!) 144/65, pulse 72, temperature (!) 97.1 F (36.2 C), temperature source Oral, resp. rate 14, height '5\' 5"'$  (1.651 m), weight 70.6 kg, SpO2 100 %. CVP:  [1 mmHg-36 mmHg] 6 mmHg  Vent Mode: PRVC FiO2 (%):  [40 %] 40 % Set Rate:  [16 bmp] 16 bmp Vt Set:  [450 mL] 450 mL PEEP:  [5 cmH20] 5 cmH20 Pressure Support:  [5 cmH20] 5 cmH20 Plateau Pressure:  [15 cmH20-18 cmH20] 18 cmH20   Intake/Output Summary (Last 24 hours) at 08/12/2021 1008 Last data filed at 08/12/2021 0900 Gross per 24 hour  Intake 1455.73 ml  Output 1210 ml  Net 245.73 ml    Filed Weights   08/10/21 0500 08/11/21 0500 08/12/21 0500  Weight: 74.2 kg 71.4 kg 70.6 kg    Examination: General: critically ill appearing woman lying in bed in NAD, intubated, not sedated HEENT [place]/AT, eyes anicteric, endotracheal tube in place Pulmonary: CTA B, small amount of thick secretions from endotracheal tube.  Breathing comfortably on 5/5 Cardiac: S1-S2, regular rate and rhythm Abdomen: Soft, nontender, nondistended.  Active bowel sounds.   Extremities: Pitting edema in all extremities.  Hands and reducible flexion contractures. Neuro: RASS -3, grimaces with range of motion.  Intermittently tracking, not mimicking or following commands.   GU: Foley with amber urine  WBC 21.1 H/H  8.7/25.3 Platelets 81 BUN 111 Creatinine 1.44 Sodium 149  Resolved medical issues:   Stool impaction Septic shock resolved  Assessment & Plan:   Active Problems:   CKD (chronic kidney disease), stage III (HCC)   Mixed hyperlipidemia due to type 2 diabetes mellitus (HCC)   Klebsiella pneumoniae sepsis  (HCC)   Vascular dementia without behavioral disturbance, psychotic disturbance, mood disturbance, or anxiety (HCC)   Pressure injury of skin   Pneumonia due to infectious organism   Malnutrition of moderate degree   Osteomyelitis of vertebra, multiple sites in spine ([place])   Bacteremia due to Gram-negative bacteria   Encephalopathy acute due to sepsis   Acute respiratory failure (HCC)   Multi-focal acute metabolic encephalopathy (sepsis w/ CNS involvement) superimposed on h/o of CVA, advanced dementia, anxiety, depression. - Continue supportive care.  Limit sedation. - Appreciate neurology's input.  With her daughters description of her decline after her last hospitalization, I worry that she will not get back to a reasonable functional status after this hospitalization.  Sepsis due to recurrent K.pneumoniae bacteremia with spinal osteomyelitis and phlegmon formation.  T9?-L1 epidural abscess, T12-L1 pathologic fracture with osteomyelitis and discitis.  Paraspinous fluid collection T11-T12 -Reviewed with neurosurgery on admission day, deemed not a surgical candidate due to severe thrombocytopenia Plan - Continue IV ceftriaxone -High risk for paraplegia with spinal abscesses.  Acute hypoxic respiratory failure with compromised airway Hx of COPD/asthma and bronchiectasis. -LTV V - Continue vent weaning.  Will discuss with her daughter again that she is at risk for failing extubation, but eventually we need to move forward with a trial of extubation. - PAD protocol for sedation-as needed fentanyl - VAP prevention protocol  Hyperkalemia, resolved Hypernatremia NAGMA Anasarca, low protein stores Worsening azotemia -Free water flushes - Continue to monitor - Albumin 50 g today.  Thrombocytopenia: likely sepsis related - Start subcutaneous heparin - Monitor CBC  Anemia of critical illness, possible GI bleed due to low HB and CG on NGT aspirate - Transfuse for hemoglobin less than  7 or hemodynamically significant bleeding  Acute on chronic renal failure secondary to sepsis/septic shock CKD 3b. - Additional diuresis - Albumin 50 g today - Strict I's/O - Renally dose meds and avoid nephrotoxic meds - Trial of removing Foley - Continue to monitor  Severe protein calorie malnutrition - Continue tube feeds  Hyperglycemia -SSI PRN -levemir increased to 12 units daily -adding TF coverage 3 units Q4h with hold parameters -goal BG 140-180  Pressure injury Stage 3 of Sacrum, present on admission  Plan - Continue wound care  We will update her daughter again today.  Best Practice (right click and "Reselect all SmartList Selections" daily)   Diet/type: tubefeeds  DVT prophylaxis: prophylactic heparin   GI prophylaxis: PPI Lines: N/A Foley:  Yes, and it is no longer needed and removal ordered  Code Status:  full code Last date of multidisciplinary goals of care discussion [7/15 -con't current aggressive care ]  This patient is critically ill with multiple organ system failure which requires frequent high complexity decision making, assessment, support, evaluation, and titration of therapies. This was completed through the application of advanced monitoring technologies and extensive interpretation of multiple databases. During this encounter critical care time was devoted to patient care services described in this note for 38 minutes.  Julian Hy, DO 08/12/21 10:29 AM [facility]

## 2021-08-13 ENCOUNTER — Other Ambulatory Visit: Payer: Self-pay | Admitting: Physician Assistant

## 2021-08-13 DIAGNOSIS — R7881 Bacteremia: Secondary | ICD-10-CM | POA: Diagnosis not present

## 2021-08-13 DIAGNOSIS — N179 Acute kidney failure, unspecified: Secondary | ICD-10-CM | POA: Diagnosis not present

## 2021-08-13 DIAGNOSIS — M4620 Osteomyelitis of vertebra, site unspecified: Secondary | ICD-10-CM | POA: Diagnosis not present

## 2021-08-13 DIAGNOSIS — F0390 Unspecified dementia without behavioral disturbance: Secondary | ICD-10-CM | POA: Diagnosis not present

## 2021-08-13 DIAGNOSIS — J9601 Acute respiratory failure with hypoxia: Secondary | ICD-10-CM | POA: Diagnosis not present

## 2021-08-13 DIAGNOSIS — Z9911 Dependence on respirator [ventilator] status: Secondary | ICD-10-CM | POA: Diagnosis not present

## 2021-08-13 LAB — CBC
HCT: 25.9 % — ABNORMAL LOW (ref 36.0–46.0)
Hemoglobin: 8.8 g/dL — ABNORMAL LOW (ref 12.0–15.0)
MCH: 29.8 pg (ref 26.0–34.0)
MCHC: 34 g/dL (ref 30.0–36.0)
MCV: 87.8 fL (ref 80.0–100.0)
Platelets: 80 10*3/uL — ABNORMAL LOW (ref 150–400)
RBC: 2.95 MIL/uL — ABNORMAL LOW (ref 3.87–5.11)
RDW: 18.6 % — ABNORMAL HIGH (ref 11.5–15.5)
WBC: 15.5 10*3/uL — ABNORMAL HIGH (ref 4.0–10.5)
nRBC: 0 % (ref 0.0–0.2)

## 2021-08-13 LAB — MAGNESIUM: Magnesium: 2.2 mg/dL (ref 1.7–2.4)

## 2021-08-13 LAB — GLUCOSE, CAPILLARY
Glucose-Capillary: 146 mg/dL — ABNORMAL HIGH (ref 70–99)
Glucose-Capillary: 172 mg/dL — ABNORMAL HIGH (ref 70–99)
Glucose-Capillary: 248 mg/dL — ABNORMAL HIGH (ref 70–99)
Glucose-Capillary: 52 mg/dL — ABNORMAL LOW (ref 70–99)
Glucose-Capillary: 53 mg/dL — ABNORMAL LOW (ref 70–99)
Glucose-Capillary: 74 mg/dL (ref 70–99)
Glucose-Capillary: 77 mg/dL (ref 70–99)
Glucose-Capillary: 78 mg/dL (ref 70–99)
Glucose-Capillary: 94 mg/dL (ref 70–99)

## 2021-08-13 LAB — BASIC METABOLIC PANEL
Anion gap: 7 (ref 5–15)
BUN: 115 mg/dL — ABNORMAL HIGH (ref 8–23)
CO2: 22 mmol/L (ref 22–32)
Calcium: 9.3 mg/dL (ref 8.9–10.3)
Chloride: 117 mmol/L — ABNORMAL HIGH (ref 98–111)
Creatinine, Ser: 1.42 mg/dL — ABNORMAL HIGH (ref 0.44–1.00)
GFR, Estimated: 38 mL/min — ABNORMAL LOW (ref >60)
Glucose, Bld: 58 mg/dL — ABNORMAL LOW (ref 70–99)
Potassium: 4 mmol/L (ref 3.5–5.1)
Sodium: 146 mmol/L — ABNORMAL HIGH (ref 135–145)

## 2021-08-13 LAB — PHOSPHORUS: Phosphorus: 3.4 mg/dL (ref 2.5–4.6)

## 2021-08-13 MED ORDER — INSULIN ASPART 100 UNIT/ML IJ SOLN
2.0000 [IU] | INTRAMUSCULAR | Status: DC
Start: 2021-08-14 — End: 2021-08-24
  Administered 2021-08-14 – 2021-08-23 (×57): 2 [IU] via SUBCUTANEOUS

## 2021-08-13 MED ORDER — DEXTROSE 50 % IV SOLN
12.5000 g | INTRAVENOUS | Status: AC
  Administered 2021-08-13: 12.5 g via INTRAVENOUS

## 2021-08-13 MED ORDER — VITAL AF 1.2 CAL PO LIQD
1000.0000 mL | ORAL | Status: DC
  Administered 2021-08-14 – 2021-08-17 (×4): 1000 mL
  Filled 2021-08-13 (×10): qty 1000

## 2021-08-13 MED ORDER — ALBUMIN HUMAN 25 % IV SOLN
50.0000 g | Freq: Once | INTRAVENOUS | Status: AC
  Administered 2021-08-13: 50 g via INTRAVENOUS
  Filled 2021-08-13: qty 200

## 2021-08-13 MED ORDER — INSULIN ASPART 100 UNIT/ML IJ SOLN: 2.0000 [IU] | INTRAMUSCULAR | Status: DC

## 2021-08-13 MED ORDER — METHYLPREDNISOLONE SODIUM SUCC 40 MG IJ SOLR
40.0000 mg | Freq: Four times a day (QID) | INTRAMUSCULAR | Status: AC
  Administered 2021-08-13 – 2021-08-14 (×4): 40 mg via INTRAVENOUS
  Filled 2021-08-13 (×4): qty 1

## 2021-08-13 MED ORDER — FUROSEMIDE 10 MG/ML IJ SOLN
80.0000 mg | Freq: Once | INTRAMUSCULAR | Status: AC
Start: 2021-08-13 — End: 2021-08-13
  Administered 2021-08-13: 80 mg via INTRAVENOUS
  Filled 2021-08-13: qty 8

## 2021-08-13 MED ORDER — INSULIN DETEMIR 100 UNIT/ML ~~LOC~~ SOLN
8.0000 [IU] | Freq: Every day | SUBCUTANEOUS | Status: DC
  Administered 2021-08-13: 8 [IU] via SUBCUTANEOUS
  Filled 2021-08-13 (×2): qty 0.08

## 2021-08-13 NOTE — Progress Notes (Signed)
NAME:  Courtney Goodman, MRN:  [id number], DOB:  11/22/1945, LOS: 9 ADMISSION DATE:  08/20/2021, CONSULTATION DATE:  08/04/21 REFERRING MD:  Dr. Armandina Gemma, ER, CHIEF COMPLAINT:  AMS   History of Present Illness:  76 yo female former smoker sent to ER with AMS and new Lt facial droop.  She was in hospital from 05/31/21 to 06/06/21 for AMS, septic shock with Klebsiella bacteremia, hypodense lesion in pancreatic head, and uncontrolled diabetes.  Reported to have fever at home.  Intubated in ER.  Started on ABx and pressors.  Neuro consulted in ER.  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  CT chest/abd/pelvis showed bronchiectasis with mucus plugging and basilar consolidation, improved appearance of pancreatic head, stool balls in rectum, and increasing lytic lesion in superior endplate wedge deformity of L1 with gas concerning for osteomyelitis. Found to have worsening anemia and PRBC ordered.  EEG and MRI imaging ordered.  PCCM asked to arrange for admission to ICU.  Hx from chart and medical team.  Pertinent  Medical History  DM type 2, HTN, HLD, CAD, CVA, Asthma/COPD, CKD 3, Anxiety with depression, Dementia  Significant Hospital Events: Including procedures, antibiotic start and stop dates in addition to other pertinent events   7/08 Admit, neuro consulted 7/9 Blood cultures positive for K.pneumoniae 7/8 Urine Cx + for > 100,000 colonies gram negative rods 7/10 Thoracic and lumbar spine: 1. 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 2. Small anterior epidural collection at T12 with left ventral epidural extension superiorly up to T9 suspicious for phlegmon and/or abscess. It is difficult to distinguish between the two without intravenous contrast. 3. 4.4 cm paraspinous fluid collection on the right at T11 and T12, suspicious for abscess. 4. Multilevel thoracolumbar spondylosis as  described above. Moderate to severe spinal canal and severe right neuroforaminal stenosis at L3-L4 7/11: platelets 8 transfused plt; off levo 7/13: EEG off; remains intubated. PLTs 21K. Weaning but mental status still barrier to extubation  Interim History / Subjective:  Overnight hypoglycemic. No other acute events.   Objective   Blood pressure (!) 139/59, pulse 73, temperature 98.6 F (37 C), temperature source Oral, resp. rate 14, height '5\' 5"'$  (1.651 m), weight 74.7 kg, SpO2 100 %. CVP:  [0 mmHg-10 mmHg] 4 mmHg  Vent Mode: CPAP;PSV FiO2 (%):  [30 %] 30 % Set Rate:  [16 bmp] 16 bmp Vt Set:  [450 mL] 450 mL PEEP:  [5 cmH20] 5 cmH20 Pressure Support:  [5 cmH20] 5 cmH20 Plateau Pressure:  [12 cmH20] 12 cmH20   Intake/Output Summary (Last 24 hours) at 08/13/2021 0948 Last data filed at 08/13/2021 0800 Gross per 24 hour  Intake 879 ml  Output 1450 ml  Net -571 ml    Filed Weights   08/11/21 0500 08/12/21 0500 08/13/21 0500  Weight: 71.4 kg 70.6 kg 74.7 kg    Examination: General: Critically ill-appearing woman lying in bed no acute distress HEENT [place]/AT, eyes anicteric, endotracheal tube in place.  Significant secretions in pharynx when suctioned Pulmonary: Breathing comfortably on 5/5.  No cuff leak.  Minimal white secretions from endotracheal tube. Cardiac: S1-S2, regular rate and rhythm Abdomen: Soft, nontender, nondistended, obese Extremities: Pain with pitting edema in all extremities.  No cyanosis or clubbing. neuro: RASS 0, no gag with oral suctioning.  No observed spontaneous swallowing.  No significant cough with tracheal suctioning.   Derm: Warm, dry, no rashes  WBC  15.5 H/H 8.8/25.9 Platelets 80 BUN 115 Creatinine 1.42 Sodium 146  Resolved medical issues:   Stool impaction Septic shock resolved  Assessment & Plan:   Active Problems:   CKD (chronic kidney disease), stage III (HCC)   Mixed hyperlipidemia due to type 2 diabetes mellitus (HCC)   Klebsiella  pneumoniae sepsis (HCC)   Vascular dementia without behavioral disturbance, psychotic disturbance, mood disturbance, or anxiety (HCC)   Pressure injury of skin   Pneumonia due to infectious organism   Malnutrition of moderate degree   Osteomyelitis of vertebra, multiple sites in spine ([place])   Bacteremia due to Gram-negative bacteria   Encephalopathy acute due to sepsis   Acute respiratory failure (HCC)   Multi-focal acute metabolic encephalopathy (sepsis w/ CNS involvement) superimposed on h/o of CVA, advanced dementia, anxiety, depression. - Con't supportive care, limit sedation unless in obvious pain. - Appreciate Neurology's input. Her baseline is basically bedbound since her last hospitalization in May 2023. I am not sure that she will have much rehab potential after this admission.  Sepsis due to recurrent K. pneumoniae bacteremia with spinal osteomyelitis and phlegmon formation.  T9?-L1 epidural abscess, T12-L1 pathologic fracture with osteomyelitis and discitis.  Paraspinous fluid collection T11-T12 -Reviewed with neurosurgery on admission day, deemed not a surgical candidate due to severe thrombocytopenia Plan -Con't IV ceftriaxone -She unfortunately remains high risk for paraplegia with spinal abscesses.  Acute hypoxic respiratory failure with compromised airway Hx of COPD/asthma and bronchiectasis. -LTVV -con't daily SAT & SBT -steroids for lack of cuff leak -VAP prevention protocol -PAD protocol for sedation - daughter wants to trach if she fails extubation; unfortunately I think this is likely with her lack of following commands and likely inability to control her oral secretions  Hyperkalemia, resolved Hypernatremia NAGMA Anasarca, low protein stores Worsening azotemia -con't FWF -con't to monitor  Thrombocytopenia: likely sepsis related - monitor -con't [place] heparin  Anemia of critical illness, possible GI bleed due to low HB and CG on NGT aspirate -Transfuse  for hemoglobin less than 7 or hemodynamically significant bleeding  Acute on chronic renal failure secondary to sepsis/septic shock CKD 3b. - Additional diuresis today with albumin -Strict I's/O - Renally dose meds and avoid nephrotoxic meds - Foley removal protocol-removed this morning.  Severe protein calorie malnutrition -Continue tube feeding  Hyperglycemia -SSI PRN - Levemir decreased to 8 units daily - Tube feeding coverage with hold parameters - Blood glucose goal 140-180  Pressure injury Stage 3 of Sacrum, present on admission  Plan - Continue wound care  Updated daughter regarding plans to hopefully get her extubated as soon as possible now that a decision to proceed with full aggressive care has been made.   Best Practice (right click and "Reselect all SmartList Selections" daily)   Diet/type: tubefeeds  DVT prophylaxis: prophylactic heparin   GI prophylaxis: PPI Lines: N/A Foley:  N/A Code Status:  full code Last date of multidisciplinary goals of care discussion [7/17 -con't current aggressive care, wants trach if she fails ]  This patient is critically ill with multiple organ system failure which requires frequent high complexity decision making, assessment, support, evaluation, and titration of therapies. This was completed through the application of advanced monitoring technologies and extensive interpretation of multiple databases. During this encounter critical care time was devoted to patient care services described in this note for 35 minutes.  Julian Hy, DO 08/13/21 10:00 AM  Pulmonary & Critical Care

## 2021-08-13 NOTE — Progress Notes (Signed)
Pt. Hypoglycemic this morning. Glucose on morning lab was 58. Treated according to hypoglycemic protocol. Recheck was 67, will notify Provider.

## 2021-08-13 NOTE — IPAL (Signed)
  Interdisciplinary Goals of Care Family Meeting   Date carried out: 08/13/2021  Location of the meeting: Bedside  Member's involved: Nurse Practitioner, Bedside Registered Nurse, and Family Member or next of kin  Durable Power of Attorney or acting medical decision maker: Daughter, [name]    Discussion: We discussed goals of care for Courtney Goodman .  Discussed Courtney Goodman's medical history over the last year.  Her daughter relays that she had "memory testing" done last year and initially scored a 71, later it was 34 and most recently it was 8.  She was informed that her mother has vascular dementia in the recent months.  The patient was living alone up until January but she had several instances of concern for medication mix up and was moved in to live with her daughter and son-in-law. Her daughter notes that up until May, she was ambulating with a walker and would get dressed and come down for breakfast. [name] had noted that her mother had gotten to a place where she was eating with her hands and resisting interventions of care.  She was hospitalized in May for klebsiella bacteremia and discharged on oral antibiotics.  Unfortunately she became ill again and was readmitted with recurrent klebsiella bacteremia & new findings of osteomyelitis / discitis of T12-L1 with pathologic fracture of the L1 vertebral body and bilateral paravertebral abscesses, small anterior epidural collection at T12.  She has had profound thrombocytopenia thought secondary to sepsis, prolonged respiratory failure and poor mental status to support extubation. When asked what her goals for the patient would be, [name] reports that she is hopeful for her to be extubated before she is committed to tracheostomy. She states it is "unacceptable for her to choke and and let her die".  I confirmed with her that she would be ok with getting her to a place where she could safely be extubated but they would want reintubation if she  fails. She would want trach after if she required reintubation. I explained that a trach does not fix any of her underlying problems.  Unfortunately, at this point, I was called to another room in the setting of cardiac arrest. I told her I would return momentarily but she was gone when I circled back to the room & did not indicate to staff if she would return.       Code status: Full Code  Disposition: Continue current acute care  Time spent for the meeting: 24 minutes    Noe Gens, MSN, APRN, NP-C, AGACNP-BC [facility] 08/13/2021, 3:27 PM   Please see [email or web address] for pager details.   From 7A-7P if no response, please call [phone] After hours, please call ELink [phone]

## 2021-08-13 NOTE — Progress Notes (Signed)
Pt hypoglycemic, result reading 53 with a second check being 52. Provider notified of low and that this occurred early this morning as well. See MAR for order changes, treated per hypoglycemic protocol.

## 2021-08-14 DIAGNOSIS — J9601 Acute respiratory failure with hypoxia: Secondary | ICD-10-CM | POA: Diagnosis not present

## 2021-08-14 DIAGNOSIS — R7989 Other specified abnormal findings of blood chemistry: Secondary | ICD-10-CM | POA: Diagnosis not present

## 2021-08-14 DIAGNOSIS — R7881 Bacteremia: Secondary | ICD-10-CM | POA: Diagnosis not present

## 2021-08-14 DIAGNOSIS — G9340 Encephalopathy, unspecified: Secondary | ICD-10-CM | POA: Diagnosis not present

## 2021-08-14 LAB — CBC
HCT: 23.8 % — ABNORMAL LOW (ref 36.0–46.0)
Hemoglobin: 7.8 g/dL — ABNORMAL LOW (ref 12.0–15.0)
MCH: 29.3 pg (ref 26.0–34.0)
MCHC: 32.8 g/dL (ref 30.0–36.0)
MCV: 89.5 fL (ref 80.0–100.0)
Platelets: 113 10*3/uL — ABNORMAL LOW (ref 150–400)
RBC: 2.66 MIL/uL — ABNORMAL LOW (ref 3.87–5.11)
RDW: 18.7 % — ABNORMAL HIGH (ref 11.5–15.5)
WBC: 19.5 10*3/uL — ABNORMAL HIGH (ref 4.0–10.5)
nRBC: 0 % (ref 0.0–0.2)

## 2021-08-14 LAB — BASIC METABOLIC PANEL
Anion gap: 11 (ref 5–15)
BUN: 137 mg/dL — ABNORMAL HIGH (ref 8–23)
CO2: 21 mmol/L — ABNORMAL LOW (ref 22–32)
Calcium: 9.5 mg/dL (ref 8.9–10.3)
Chloride: 113 mmol/L — ABNORMAL HIGH (ref 98–111)
Creatinine, Ser: 1.75 mg/dL — ABNORMAL HIGH (ref 0.44–1.00)
GFR, Estimated: 30 mL/min — ABNORMAL LOW (ref >60)
Glucose, Bld: 263 mg/dL — ABNORMAL HIGH (ref 70–99)
Potassium: 4.2 mmol/L (ref 3.5–5.1)
Sodium: 145 mmol/L (ref 135–145)

## 2021-08-14 LAB — GLUCOSE, CAPILLARY
Glucose-Capillary: 251 mg/dL — ABNORMAL HIGH (ref 70–99)
Glucose-Capillary: 232 mg/dL — ABNORMAL HIGH (ref 70–99)
Glucose-Capillary: 217 mg/dL — ABNORMAL HIGH (ref 70–99)
Glucose-Capillary: 145 mg/dL — ABNORMAL HIGH (ref 70–99)
Glucose-Capillary: 134 mg/dL — ABNORMAL HIGH (ref 70–99)
Glucose-Capillary: 156 mg/dL — ABNORMAL HIGH (ref 70–99)

## 2021-08-14 LAB — MAGNESIUM: Magnesium: 2.1 mg/dL (ref 1.7–2.4)

## 2021-08-14 LAB — PHOSPHORUS: Phosphorus: 4.1 mg/dL (ref 2.5–4.6)

## 2021-08-14 NOTE — Progress Notes (Signed)
Nutrition Follow-up  DOCUMENTATION CODES:   Non-severe (moderate) malnutrition in context of chronic illness  INTERVENTION:   Tube Feeding via Cortrak:  Vital AF 1.2 at 60 ml/hr This provides 108 g of protein, 1728 kcals, 1166 mL of free water  Continue Juven BID, each packet provides 80 calories, 8 grams of carbohydrate, 2.5  grams of protein (collagen), 7 grams of L-arginine and 7 grams of L-glutamine; supplement contains CaHMB, Vitamins C, E, B12 and Zinc to promote wound healing   NUTRITION DIAGNOSIS:   Moderate Malnutrition related to chronic illness as evidenced by moderate fat depletion, moderate muscle depletion.  Being addressed via TF   GOAL:   Patient will meet greater than or equal to 90% of their needs  Progressing  MONITOR:   Vent status, Labs, Weight trends, TF tolerance  REASON FOR ASSESSMENT:   Consult, Ventilator Enteral/tube feeding initiation and management  ASSESSMENT:   76 yo female admitted with septic shock due to Klebsiella bacteremia from lumbar osteomyelitis and paraspinal abscess from prior UTI, acute respiratory failure and acute toxic encephalopathy requiring intubation. PMH includes DM, HTN, HLD, CAD, CVA, asthma, COPD, CKD 3, dementia  Extubated today, plan to re-intubate if she fails. Family wanting trach as well if this is needed. Remains Full Code  Vital AF 1.2 turned down to 30 ml/hr last week due to refeeding and was not turned back up to goal of 60 ml/hr until yesterday. TF off this AM for extubation,. Noted plan to resume TF as soon as able  Noted hypoglycemia yesterday, improved after TF back at goal  Free water flushes of 200 mL q 4 hours  Current wt 74.3 kg; current wt 64.6 kg. Net + 15 L. Making urine  Labs: BUN 137 (H), Creatinine 1.75, sodium 145, phosphorus wdl, potassium wdl, magnesium wdl Meds: colace, ss novolog, novolog q 4 hours, miralax, thiamine   Diet Order:   Diet Order             Diet NPO time specified   Diet effective now                   EDUCATION NEEDS:   Not appropriate for education at this time  Skin:  Skin Assessment: Skin Integrity Issues: Skin Integrity Issues:: Stage III Stage III: coccyx  Last BM:  7/17  Height:   Ht Readings from Last 1 Encounters:  08/09/21 '5\' 5"'$  (1.651 m)    Weight:   Wt Readings from Last 1 Encounters:  08/14/21 74.3 kg    BMI:  Body mass index is 27.26 kg/m.  Estimated Nutritional Needs:   Kcal:  1600-1800 kcals  Protein:  90-110 g  Fluid:  >/= 1.6 L   Kerman Passey MS, RDN, LDN, CNSC Registered Dietitian 3 Clinical Nutrition RD Pager and On-Call Pager Number Located in [place]

## 2021-08-14 NOTE — Procedures (Signed)
Extubation Procedure Note  Patient Details:   Name: Emaleigh Guimond Leyda DOB: [birth year] MRN: [id number]   Airway Documentation:    Vent end date: 08/14/21 Vent end time: 1135   Evaluation  O2 sats: stable throughout Complications: No apparent complications Patient did tolerate procedure well. Bilateral Breath Sounds: Diminished, Clear   No  RT extubated patient to 4L  per MD order with RN at bedside. Positive cuff leak noted. Patient tolerated well but will not cough when asked by RT. RR and sats currently stable and no stridor noted at this time. RT will continue to monitor as needed.   Fabiola Backer 08/14/2021, 11:51 AM

## 2021-08-14 NOTE — IPAL (Signed)
  Interdisciplinary Goals of Care Family Meeting   Date carried out:: 08/14/2021  Location of the meeting: Bedside  Member's involved: Physician, Bedside Registered Nurse, and Family Member or next of kin  Durable Power of Attorney or acting medical decision maker: both daughters    Discussion: We discussed goals of care for [place] .  I updated both of her daughters at bedside.  They understand that she has likely had a permanent decline in her functional status again after this episode of sepsis.  They recognize that this happened in May 2023 when she was sick.  We have discussed her respiratory failure and her sepsis overall.  They understand my concern that she will likely not survive this admission due to unresolvable epidural abscess and spine infections.  Although she can mechanically due to work of breathing based on passing SBT's for several days, I remain concerned that she will not protect her airway.  They would like to give her her best shot including tracheostomy potentially because they want her here longer.  Extubation today with plan for reintubation if she fails.  Code status: Full Code  Disposition: Continue current acute care   Time spent for the meeting: 10 min.  Julian Hy 08/14/2021, 10:04 AM

## 2021-08-14 NOTE — Progress Notes (Signed)
RT NOTE: patient placed on CPAP/PSV of 5/5 at 0733.  Currently tolerating well.  Will continue to monitor.

## 2021-08-14 NOTE — Progress Notes (Signed)
Patient ID: Courtney Goodman, female   DOB: [birth year], 76 y.o.   MRN: [id number]    Progress Note from the Palliative Medicine Team at [facility]   Patient Name: Courtney Goodman        Date: 08/14/2021 DOB: 26-Aug-1945  Age: 76 y.o. MRN#: [id number] Attending Physician: Julian Hy, DO Primary Care Physician: [name] Admit Date: 08/18/2021   Medical records reviewed   76 y.o. female  admitted on 08/22/2021 through the ER with AMS and new Lt facial droop.  She was in hospital from 05/31/21 to 06/06/21 for AMS, septic shock with Klebsiella bacteremia, hypodense lesion in pancreatic head, and uncontrolled diabetes.  Reported to have fever at home.  Intubated in ER.  Started on ABx and pressors.  Neuro consulted in ER.  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  CT chest/abd/pelvis showed bronchiectasis with mucus plugging and basilar consolidation, improved appearance of pancreatic head, stool balls in rectum, and increasing lytic lesion in superior endplate wedge deformity of L1 with gas concerning for osteomyelitis. Found to have worsening anemia and PRBC ordered.  EEG and MRI imaging ordered.  Patient admitted for treatment stabilization.   Neuro consult note.  Blood cultures positive for k. Pneumonia, urine culture positive for greater than 100,000 gram-negative rods.   Work-up significant for recurrent Klebsiella bacteremia and new findings of osteomyelitis/Discitis of T12-L1 with pathologic fracture of L1 vertebral  body and bilateral paravertebral abscesses.   Patient currently intubated,  today is day 9 of this hospitalization.  As discussed on multiple visits with many providers patient's baseline had continued to decline physically, functionally and cognitively over the last many months.  Patient was living at home with her daughter Linna Hoff, patient dependent for all ADLs  Overarching concern is patient's high risk for decompensation.  Goal is to wean patient off the  vent but critical decision pending whether to reintubate or trach and PEG.       Significant Hospital events  7/08 Admit, neuro consulted 7/9 Blood cultures positive for K.pneumoniae 7/8 Urine Cx + for > 100,000 colonies gram negative rods 7/10 Thoracic and lumbar spine: 1. 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 2. Small anterior epidural collection at T12 with left ventral epidural extension superiorly up to T9 suspicious for phlegmon and/or abscess. It is difficult to distinguish between the two without intravenous contrast. 3. 4.4 cm paraspinous fluid collection on the right at T11 and T12, suspicious for abscess. 4. Multilevel thoracolumbar spondylosis as described above. Moderate to severe spinal canal and severe right neuroforaminal stenosis at L3-L4 7/11: platelets 8 transfused plt; off levo 7/13: EEG off; remains intubated. PLTs 21K. Weaning but mental status still barrier to extubation      This NP assessed patient at the bedside as a follow up for palliative medicine needs and emotional support.    I spoke to daughter [name] by telephone for continued regarding current medical situation.  Again we spoke to the seriousness in this situation specific paravertebral abscesses/osteomyelitis complicated by underlying significant dementia, and patient's high risk to decompensate   Education offered on the difference between aggressive medical intervention path versus a palliative comfort path for this, patient at this time and in this situation.  Education offered specifically on comfort measures/interventions as it relates to respiratory decline  at end-of-life, ensuring comfort and dignity   Education offered looking at best case  scenario versus worst-case scenario in a critically ill patient with end-stage dementia.   Encouraged to consider possible long term outcome of  trach, PEG and skilled nursing facility placement.   Daughter verbalizes that she is hearing the information being delivered, does not have questions or concerns.   Education offered today regarding  the importance of continued conversation with family and the medical providers regarding overall plan of care and treatment options,  ensuring decisions are within the context of the patients values and GOCs.  PMT will continue to support holistically     Discussed with CCM and bedside RN  Wadie Lessen NP  Palliative Medicine Team Team Phone # [phone] Pager [phone]

## 2021-08-14 NOTE — Progress Notes (Addendum)
NAME:  Courtney Goodman, MRN:  [id number], DOB:  Jun 30, 1945, LOS: 73 ADMISSION DATE:  07/30/2021, CONSULTATION DATE:  08/04/21 REFERRING MD:  Dr. Armandina Gemma, ER, CHIEF COMPLAINT:  AMS   History of Present Illness:  76 yo female former smoker sent to ER with AMS and new Lt facial droop.  She was in hospital from 05/31/21 to 06/06/21 for AMS, septic shock with Klebsiella bacteremia, hypodense lesion in pancreatic head, and uncontrolled diabetes.  Reported to have fever at home.  Intubated in ER.  Started on ABx and pressors.  Neuro consulted in ER.  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  CT chest/abd/pelvis showed bronchiectasis with mucus plugging and basilar consolidation, improved appearance of pancreatic head, stool balls in rectum, and increasing lytic lesion in superior endplate wedge deformity of L1 with gas concerning for osteomyelitis. Found to have worsening anemia and PRBC ordered.  EEG and MRI imaging ordered.  PCCM asked to arrange for admission to ICU.  Hx from chart and medical team.  Pertinent  Medical History  DM type 2, HTN, HLD, CAD, CVA, Asthma/COPD, CKD 3, Anxiety with depression, Dementia  Significant Hospital Events: Including procedures, antibiotic start and stop dates in addition to other pertinent events   7/08 Admit, neuro consulted 7/9 Blood cultures positive for K.pneumoniae 7/8 Urine Cx + for > 100,000 colonies gram negative rods 7/10 Thoracic and lumbar spine: 1. 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 2. Small anterior epidural collection at T12 with left ventral epidural extension superiorly up to T9 suspicious for phlegmon and/or abscess. It is difficult to distinguish between the two without intravenous contrast. 3. 4.4 cm paraspinous fluid collection on the right at T11 and T12, suspicious for abscess. 4. Multilevel thoracolumbar spondylosis as  described above. Moderate to severe spinal canal and severe right neuroforaminal stenosis at L3-L4 7/11: platelets 8 transfused plt; off levo 7/13: EEG off; remains intubated. PLTs 21K. Weaning but mental status still barrier to extubation  Interim History / Subjective:  No acute changes overnight.  Objective   Blood pressure (!) 146/69, pulse 75, temperature 98.4 F (36.9 C), temperature source Oral, resp. rate 14, height '5\' 5"'$  (1.651 m), weight 74.3 kg, SpO2 100 %. CVP:  [1 mmHg-3 mmHg] 2 mmHg  Vent Mode: PSV;CPAP FiO2 (%):  [30 %] 30 % Set Rate:  [16 bmp] 16 bmp Vt Set:  [450 mL] 450 mL PEEP:  [5 cmH20] 5 cmH20 Pressure Support:  [5 cmH20] 5 cmH20 Plateau Pressure:  [15 cmH20-18 cmH20] 18 cmH20   Intake/Output Summary (Last 24 hours) at 08/14/2021 0933 Last data filed at 08/14/2021 0800 Gross per 24 hour  Intake 2299.21 ml  Output 1450 ml  Net 849.21 ml    Filed Weights   08/12/21 0500 08/13/21 0500 08/14/21 0500  Weight: 70.6 kg 74.7 kg 74.3 kg    Examination: General: Critically ill-appearing woman lying in bed in no acute distress HEENT [place]/AT, eyes anicteric.  Endotracheal tube and core track in place.  Less pharyngeal secretions today.   Pulmonary: Breathing comfortably on 5/5, mild white secretions from endotracheal tube.  CTAB. Cardiac: S1-S2, regular rate and rhythm Abdomen: Soft, nontender, nondistended Extremities: Ongoing significant edema.  No cyanosis or clubbing. Neuro grimacing during exam.  RASS -4.  No response to verbal stimulation, not following commands.  Nonpurposeful movement in lower extremities still. Derm: Warm, dry, no diffuse rashes.  Few ecchymoses  WBC  19.5 H/H 7.8/23.8 Platelets 113 Sodium 145 Potassium 4.2 BUN 137 Creatinine 1.75 Sodium 145  Resolved medical issues:   Stool impaction Septic shock resolved  Assessment & Plan:   Active Problems:   CKD (chronic kidney disease), stage III (HCC)   Mixed hyperlipidemia due to type 2  diabetes mellitus (HCC)   Klebsiella pneumoniae sepsis (HCC)   Vascular dementia without behavioral disturbance, psychotic disturbance, mood disturbance, or anxiety (HCC)   Pressure injury of skin   Pneumonia due to infectious organism   Malnutrition of moderate degree   Osteomyelitis of vertebra, multiple sites in spine ([place])   Bacteremia due to Gram-negative bacteria   Encephalopathy acute due to sepsis   Acute respiratory failure (HCC)   Multi-focal acute metabolic encephalopathy (sepsis w/ CNS involvement) superimposed on h/o of CVA, advanced dementia, anxiety, depression. -Continue supportive care.  Limiting sedation is much as possible to maximize assessments and potential for recovery -Appreciate neurology's input-I agree this is likely all metabolic, mostly due to her sepsis.  She had not recovered to her previous baseline with sepsis in May 2023.  I am very worried that this will happen again and she will have a new severely impaired baseline after this admission.  Sepsis due to recurrent K. pneumoniae bacteremia with spinal osteomyelitis and phlegmon formation.  T9?-L1 epidural abscess, T12-L1 pathologic fracture with osteomyelitis and discitis.  Paraspinous fluid collection T11-T12 -Reviewed with neurosurgery on admission day, deemed not a surgical candidate due to severe thrombocytopenia Plan -Continue IV ceftriaxone.  Probably needs chronic suppressive antibiotics. -She unfortunately remains high risk for paraplegia with spinal abscesses, although so far she is still moving her legs spontaneously.  Acute hypoxic respiratory failure with compromised airway Hx of COPD/asthma and bronchiectasis. -LTV V - Daily SAT and SBT.  Still not following commands.  I have discussed with her daughters.  She deserves a chance of extubation, but I anticipate that she is not going to be able to control her oral secretions.  Plan will be for reintubation and would want trach if she fails. -VAP  prevention protocol  Hyperkalemia, resolved Hypernatremia NAGMA Anasarca, low protein stores Worsening azotemia -continue free water flushes when tube feedings are resumed after extubation - Continue to monitor  Thrombocytopenia: likely sepsis related, slowly improving -Continue subcutaneous heparin for DVT prophylaxis - Monitor  Anemia of critical illness, possible GI bleed due to low HB significant azotemia - Transfuse for hemoglobin less than 7 or hemodynamically significant bleeding.  Acute on chronic renal failure secondary to sepsis/septic shock CKD 3b. - Holding additional diuresis today; remains hypervolemic and has been net positive despite attempts at diuresis with albumin. - Strict I/os - Renally dose meds and avoid nephrotoxic meds - Monitor for urinary retention.  Severe protein calorie malnutrition -After extubation if she remains stable we can resume tube feeding  Hyperglycemia -SSI. - Holding Levemir today since tube feeds were held periextubation  - Goal blood glucose 140-180  Pressure injury Stage 3 of Sacrum, present on admission  Plan -Continue wound care and frequent turns  Both daughters updated at bedside with RN present.  Plan for trial of extubation reviewed.  They understand my concern that she is unlikely to survive this admission due to severe medical issues and severe baseline dementia and debility.  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   Best Practice (right click and "Reselect all SmartList Selections" daily)   Diet/type: tubefeeds  DVT prophylaxis: prophylactic heparin   GI  prophylaxis: PPI Lines: N/A Foley:  N/A Code Status:  full code Last date of multidisciplinary goals of care discussion [7/18 -con't current aggressive care, wants trach if she fails ]  This patient is critically ill with multiple organ system failure which requires frequent high complexity decision making, assessment, support, evaluation,  and titration of therapies. This was completed through the application of advanced monitoring technologies and extensive interpretation of multiple databases. During this encounter critical care time was devoted to patient care services described in this note for 40 minutes.  Julian Hy, DO 08/14/21 2:51 PM [facility]

## 2021-08-15 ENCOUNTER — Inpatient Hospital Stay: Payer: Medicare HMO

## 2021-08-15 ENCOUNTER — Inpatient Hospital Stay: Payer: Medicare HMO | Admitting: Physician Assistant

## 2021-08-15 ENCOUNTER — Inpatient Hospital Stay (HOSPITAL_COMMUNITY): Payer: Medicare HMO

## 2021-08-15 DIAGNOSIS — R7881 Bacteremia: Secondary | ICD-10-CM | POA: Diagnosis not present

## 2021-08-15 DIAGNOSIS — G062 Extradural and subdural abscess, unspecified: Secondary | ICD-10-CM | POA: Diagnosis not present

## 2021-08-15 DIAGNOSIS — G9341 Metabolic encephalopathy: Secondary | ICD-10-CM

## 2021-08-15 DIAGNOSIS — M4620 Osteomyelitis of vertebra, site unspecified: Secondary | ICD-10-CM | POA: Diagnosis not present

## 2021-08-15 DIAGNOSIS — N179 Acute kidney failure, unspecified: Secondary | ICD-10-CM | POA: Diagnosis not present

## 2021-08-15 LAB — URINALYSIS, ROUTINE W REFLEX MICROSCOPIC
Bilirubin Urine: NEGATIVE
Glucose, UA: NEGATIVE mg/dL
Hgb urine dipstick: NEGATIVE
Ketones, ur: NEGATIVE mg/dL
Nitrite: NEGATIVE
Protein, ur: 100 mg/dL — AB
Specific Gravity, Urine: 1.014 (ref 1.005–1.030)
WBC, UA: >50 WBC/hpf — ABNORMAL HIGH (ref 0–5)
pH: 5 (ref 5.0–8.0)

## 2021-08-15 LAB — CBC
HCT: 25.2 % — ABNORMAL LOW (ref 36.0–46.0)
Hemoglobin: 8.4 g/dL — ABNORMAL LOW (ref 12.0–15.0)
MCH: 29.7 pg (ref 26.0–34.0)
MCHC: 33.3 g/dL (ref 30.0–36.0)
MCV: 89 fL (ref 80.0–100.0)
Platelets: 145 10*3/uL — ABNORMAL LOW (ref 150–400)
RBC: 2.83 MIL/uL — ABNORMAL LOW (ref 3.87–5.11)
RDW: 19.2 % — ABNORMAL HIGH (ref 11.5–15.5)
WBC: 17.8 10*3/uL — ABNORMAL HIGH (ref 4.0–10.5)
nRBC: 0 % (ref 0.0–0.2)

## 2021-08-15 LAB — GLUCOSE, CAPILLARY
Glucose-Capillary: 156 mg/dL — ABNORMAL HIGH (ref 70–99)
Glucose-Capillary: 126 mg/dL — ABNORMAL HIGH (ref 70–99)
Glucose-Capillary: 131 mg/dL — ABNORMAL HIGH (ref 70–99)
Glucose-Capillary: 160 mg/dL — ABNORMAL HIGH (ref 70–99)
Glucose-Capillary: 216 mg/dL — ABNORMAL HIGH (ref 70–99)

## 2021-08-15 LAB — BASIC METABOLIC PANEL
Anion gap: 9 (ref 5–15)
BUN: 143 mg/dL — ABNORMAL HIGH (ref 8–23)
CO2: 22 mmol/L (ref 22–32)
Calcium: 9.4 mg/dL (ref 8.9–10.3)
Chloride: 117 mmol/L — ABNORMAL HIGH (ref 98–111)
Creatinine, Ser: 1.98 mg/dL — ABNORMAL HIGH (ref 0.44–1.00)
GFR, Estimated: 26 mL/min — ABNORMAL LOW (ref >60)
Glucose, Bld: 164 mg/dL — ABNORMAL HIGH (ref 70–99)
Potassium: 3.6 mmol/L (ref 3.5–5.1)
Sodium: 148 mmol/L — ABNORMAL HIGH (ref 135–145)

## 2021-08-15 LAB — MAGNESIUM: Magnesium: 2.1 mg/dL (ref 1.7–2.4)

## 2021-08-15 MED ORDER — FREE WATER
300.0000 mL | Status: DC
Start: 2021-08-15 — End: 2021-08-17
  Administered 2021-08-15 – 2021-08-17 (×10): 300 mL

## 2021-08-15 MED ORDER — ORAL CARE MOUTH RINSE
15.0000 mL | OROMUCOSAL | Status: DC
Start: 2021-08-15 — End: 2021-08-24
  Administered 2021-08-15 – 2021-08-24 (×40): 15 mL via OROMUCOSAL

## 2021-08-15 MED ORDER — ORAL CARE MOUTH RINSE
15.0000 mL | OROMUCOSAL | Status: DC | PRN
Start: 2021-08-15 — End: 2021-08-24

## 2021-08-15 NOTE — Consult Note (Addendum)
Nephrology Consult   Assessment/Recommendations:   AKI on CKD 4 (nonoliguric): Likely secondary to sepsis & diuresis. Suspecting she is intravascularly dry -underlying CKD likely related to diabetic kidney disease, followed by Dr. Johnney Ou in the office, last Cr in April 2023 was 2.06 w/ eGFR of 25 which seems to be around her baseline -overall, she is not an ideal long term dialysis candidate given underlying dementia and severe illness (epidural abscess). Will discuss concerns with family -FWF just increased, will see if she has any response to this. If not, will give her a trial of albumin infusions -will repeat UA and will check renal u/s -Continue to monitor daily Cr, Dose meds for GFR<15 -Maintain MAP>65 for optimal renal perfusion.  -Avoid nephrotoxic medications including NSAIDs and iodinated intravenous contrast exposure unless the latter is absolutely indicated.  Preferred narcotic agents for pain control are hydromorphone, fentanyl, and methadone. Morphine should not be used. Avoid Baclofen and avoid oral sodium phosphate and magnesium citrate based laxatives / bowel preps. Continue strict Input and Output monitoring. Will monitor the patient closely with you and intervene or adjust therapy as indicated by changes in clinical status/labs   Sepsis secondary to urinary source (K. Pneumoniae) w/ spinal OM and epidural abscess, discitis -abx per primary service -not a surgical candidate due to severe thrombocytopenia  AHRF -secondary to compromised airway, extubated 7/18 -high risk for re-intubation  Toxic metabolic encephalopathy -has been seen by neurology. CNS involvement from CNS superimposed on h/o CVA, advanced dementia  Hypertension: -BPs are currently acceptable  Hypernatremia -FWF inc'ed today  Anemia due to chronic disease: -Transfuse for Hgb<7 g/dL -avoid IV iron in the context of sepsis/bacteremia  Diabetes Mellitus Type 2 with Hyperglycemia -Per primary  service  Thrombocytopenia -likely secondary to sepsis, per primary -plt ct improving  Severe protein calorie malnutrition -per primary  Addendum: updated daughter over the phone [name]) at [facility] 08/15/2021 1:15 PM   _____________________________________________________________________________________   History of Present Illness: Courtney Goodman is a/an 76 y.o. female with a past medical history of dementia, CKD, DM 2, hypertension, history of CVA, CAD, asthma/COPD who presents to [facility] with altered mental status.  Had a recent hospitalization in May with sepsis secondary to urinary source.  Per chart review, was having fever at home, intubated in ER for airway protection.  Found to have recurrent Klebsiella pneumonia bacteremia with spinal osteomyelitis and phlegmon formation/epidural abscess not a surgical candidate due to severe thrombocytopenia.  On IV ceftriaxone now.  Extubated on 7/19.  Has been having anasarca and was receiving diuresis with albumin which was held as of 7/17.  Creatinine has been steadily rising, now 1.98 today. BUN up to 143 from 137 yesterday. ROS unobtainable given encephalopathy  Medications:  Current Facility-Administered Medications  Medication Dose Route Frequency Provider Last Rate Last Admin   0.9 %  sodium chloride infusion  250 mL Intravenous Continuous Chesley Mires, MD 10 mL/hr at 08/15/21 0945 250 mL at 08/15/21 0945   cefTRIAXone (ROCEPHIN) 2 g in sodium chloride 0.9 % 100 mL IVPB  2 g Intravenous Q12H Kipp Brood, MD 200 mL/hr at 08/15/21 0946 2 g at 08/15/21 0946   Chlorhexidine Gluconate Cloth 2 % PADS 6 each  6 each Topical Daily Chesley Mires, MD   6 each at 08/15/21 0944   dextrose 50 % solution 0-50 mL  0-50 mL Intravenous PRN Frederik Pear, MD   50 mL at 08/13/21 0830   docusate (COLACE)  50 MG/5ML liquid 100 mg  100 mg Per Tube BID Mick Sell, PA-C   100 mg at 08/14/21 2203   feeding  supplement (VITAL AF 1.2 CAL) liquid 1,000 mL  1,000 mL Per Tube Continuous Julian Hy, DO 60 mL/hr at 08/15/21 0800 Infusion Verify at 08/15/21 0800   fentaNYL (SUBLIMAZE) injection 25-100 mcg  25-100 mcg Intravenous Q30 min PRN Chesley Mires, MD   50 mcg at 08/15/21 0313   free water 300 mL  300 mL Per Tube Q4H Noemi Chapel P, DO       heparin injection 5,000 Units  5,000 Units Subcutaneous Q8H Julian Hy, DO   5,000 Units at 08/15/21 0557   hydrALAZINE (APRESOLINE) injection 10 mg  10 mg Intravenous Q4H PRN Anders Simmonds, MD   10 mg at 08/15/21 0616   insulin aspart (novoLOG) injection 0-15 Units  0-15 Units Subcutaneous Q4H Judd Lien, MD   2 Units at 08/15/21 1134   insulin aspart (novoLOG) injection 2 Units  2 Units Subcutaneous Q4H Noemi Chapel P, DO   2 Units at 08/15/21 1134   ipratropium-albuterol (DUONEB) 0.5-2.5 (3) MG/3ML nebulizer solution 3 mL  3 mL Nebulization Q4H PRN Chesley Mires, MD       nutrition supplement (JUVEN) (JUVEN) powder packet 1 packet  1 packet Per Tube BID BM Candee Furbish, MD   1 packet at 08/15/21 [phone]   Oral care mouth rinse  15 mL Mouth Rinse PRN Jacky Kindle, MD       Oral care mouth rinse  15 mL Mouth Rinse 4 times per day Noemi Chapel P, DO   15 mL at 08/15/21 1135   Oral care mouth rinse  15 mL Mouth Rinse PRN Noemi Chapel P, DO       pantoprazole (PROTONIX) injection 40 mg  40 mg Intravenous Q12H Kipp Brood, MD   40 mg at 08/15/21 0943   polyethylene glycol (MIRALAX / GLYCOLAX) packet 17 g  17 g Per Tube Daily Andres Labrum D, PA-C   17 g at 08/14/21 0947   sodium chloride flush (NS) 0.9 % injection 10-40 mL  10-40 mL Intracatheter Q12H Agarwala, Einar Grad, MD   10 mL at 08/15/21 0943   sodium chloride flush (NS) 0.9 % injection 10-40 mL  10-40 mL Intracatheter PRN Kipp Brood, MD       white petrolatum (VASELINE) gel   Topical PRN Mick Sell, PA-C   0.2 Application at 40/08/67 1136     ALLERGIES Patient has no known  allergies.  MEDICAL HISTORY Past Medical History:  Diagnosis Date   Anemia of chronic disease    Asthma    Atherosclerotic heart disease of native coronary artery without angina pectoris    CKD (chronic kidney disease)    COPD (chronic obstructive pulmonary disease) (HCC)    DM (diabetes mellitus), type 2 with renal complications (HCC)    Dysrhythmia    Elevated ferritin 01/03/2017   GAD (generalized anxiety disorder)    Hypertension    Mixed hyperlipidemia due to type 2 diabetes mellitus (HCC)    Mixed incontinence    per medical records from [facility]   Osteopenia    Personal history of noncompliance with medical treatment, presenting hazards to health    Shock ([place])    Shortness of breath dyspnea    Suicidal ideation    TB (tuberculosis), treated    age 32   Vitamin D deficiency      SOCIAL  HISTORY Social History   Socioeconomic History   Marital status: Divorced    Spouse name: Not on file   Number of children: 2   Years of education: 12   Highest education level: High school graduate  Occupational History   Occupation: Retired  Tobacco Use   Smoking status: Former   Smokeless tobacco: Never  Scientific laboratory technician Use: Never used  Substance and Sexual Activity   Alcohol use: No   Drug use: No   Sexual activity: Not Currently  Other Topics Concern   Not on file  Social History Narrative   Lives alone    Left handed   Social Determinants of Health   Financial Resource Strain: Low Risk  (01/05/2021)   Overall Financial Resource Strain (CARDIA)    Difficulty of Paying Living Expenses: Not hard at all  Food Insecurity: No Food Insecurity (06/08/2021)   Hunger Vital Sign    Worried About Running Out of Food in the Last Year: Never true    [place] in the Last Year: Never true  Transportation Needs: No Transportation Needs (04/16/2021)   PRAPARE - Hydrologist (Medical): No    Lack of Transportation (Non-Medical): No  Physical  Activity: Inactive (01/05/2021)   Exercise Vital Sign    Days of Exercise per Week: 0 days    Minutes of Exercise per Session: 0 min  Stress: No Stress Concern Present (01/05/2021)   [place]    Feeling of Stress : Only a little  Social Connections: Moderately Isolated (10/05/2019)   Social Connection and Isolation Panel [NHANES]    Frequency of Communication with Friends and Family: More than three times a week    Frequency of Social Gatherings with Friends and Family: More than three times a week    Attends Religious Services: More than 4 times per year    Active Member of Clubs or Organizations: No    Attends Archivist Meetings: Never    Marital Status: Divorced  Human resources officer Violence: Not At Risk (10/05/2019)   Humiliation, Afraid, Rape, and Kick questionnaire    Fear of Current or Ex-Partner: No    Emotionally Abused: No    Physically Abused: No    Sexually Abused: No     FAMILY HISTORY Family History  Problem Relation Age of Onset   Diabetes Mother      Review of Systems: unobtainable  Physical Exam: Vitals:   08/15/21 1100 08/15/21 1200  BP: 122/61 133/60  Pulse: 70 74  Resp: 15 14  Temp:  97.6 F (36.4 C)  SpO2: 98% 98%   Total I/O In: 260 [NG/GT:260] Out: -   Intake/Output Summary (Last 24 hours) at 08/15/2021 1315 Last data filed at 08/15/2021 0800 Gross per 24 hour  Intake 2003 ml  Output 1600 ml  Net 403 ml   General: ill appearing HEENT: dry MM, NGT in place CV: regular rate, normal rhythm Lungs: clear to auscultation bilaterally Abd: soft, non-tender, non-distended Skin: no visible lesions or rashes Musculoskeletal: anasarca Neuro: not following commands, grimaces at times  Test Results Reviewed Lab Results  Component Value Date   NA 148 (H) 08/15/2021   K 3.6 08/15/2021   CL 117 (H) 08/15/2021   CO2 22 08/15/2021   BUN 143 (H) 08/15/2021   CREATININE 1.98  (H) 08/15/2021   CALCIUM 9.4 08/15/2021   ALBUMIN <1.5 (L) 08/10/2021   PHOS  4.1 08/14/2021     I have reviewed all relevant outside healthcare records related to the patient's kidney injury.

## 2021-08-15 NOTE — Consult Note (Signed)
[facility]       Reason for Consult:  Positive urine and blood cultures for Klebsiella   Referring Physician: Dr. Carlis Abbott  Active Problems:   CKD (chronic kidney disease), stage III ([place])   Mixed hyperlipidemia due to type 2 diabetes mellitus ([place])   Klebsiella pneumoniae sepsis ([place])   Vascular dementia without behavioral disturbance, psychotic disturbance, mood disturbance, or anxiety (HCC)   Pressure injury of skin   Pneumonia due to infectious organism   Malnutrition of moderate degree   Osteomyelitis of vertebra, multiple sites in spine (HCC)   Bacteremia due to Gram-negative bacteria   Encephalopathy acute due to sepsis   Acute respiratory failure (HCC)    Chlorhexidine Gluconate Cloth  6 each Topical Daily   docusate  100 mg Per Tube BID   free water  300 mL Per Tube Q4H   heparin injection (subcutaneous)  5,000 Units Subcutaneous Q8H   insulin aspart  0-15 Units Subcutaneous Q4H   insulin aspart  2 Units Subcutaneous Q4H   nutrition supplement (JUVEN)  1 packet Per Tube BID BM   mouth rinse  15 mL Mouth Rinse 4 times per day   pantoprazole  40 mg Intravenous Q12H   polyethylene glycol  17 g Per Tube Daily   sodium chloride flush  10-40 mL Intracatheter Q12H    Recommendations: Cefadroxil 1000 mg orally twice a day Duration will be lifelong and suppressive in nature   Assessment: She has an extensive lumbar infection including osteomyelitis and abscess and persistent blood cultures with Klebsiella pneumoniae.  Not a surgical candidate with thrombocytopenia and overall condition so goal will be to suppress the infection with above.    Antibiotics: Day 12 total antibiotics  HPI: Courtney Goodman is a 76 y.o. female with a history of recent hospitalization in May for septic shock with Klebsiella bacteremia presented back to the ED on 7/8 with a new left facial droop and altered mental status and continued decline.  She was reported to  have fever and blood culture again positive for  Klebsiella pneumonia.  MRI done of her spine and significant for osteomyelitis/discitis of the T12-L1 region and bilateral paravertebral abscesses.  Also an epidural collection at T12 with left ventral epidural extension up to T9 suspicious for phlegmon or possible abscess and another paraspinous fluid collection on the right of T11-12 c/w an abscess.  She has known end stage dementia and requires full care.  She was intubated and now extubated and breathing on her own.     Review of Systems:  Unable to be assessed due to mental status  Past Medical History:  Diagnosis Date   Anemia of chronic disease    Asthma    Atherosclerotic heart disease of native coronary artery without angina pectoris    CKD (chronic kidney disease)    COPD (chronic obstructive pulmonary disease) (HCC)    DM (diabetes mellitus), type 2 with renal complications (HCC)    Dysrhythmia    Elevated ferritin 01/03/2017   GAD (generalized anxiety disorder)    Hypertension    Mixed hyperlipidemia due to type 2 diabetes mellitus (HCC)    Mixed incontinence    per medical records from [facility]   Osteopenia    Personal history of noncompliance with medical treatment, presenting hazards to health    Shock ([place])    Shortness of breath dyspnea    Suicidal ideation    TB (tuberculosis), treated    age 73  Vitamin D deficiency     Social History   Tobacco Use   Smoking status: Former   Smokeless tobacco: Never  Scientific laboratory technician Use: Never used  Substance Use Topics   Alcohol use: No   Drug use: No    Family History  Problem Relation Age of Onset   Diabetes Mother     No Known Allergies  Physical Exam: Constitutional: in no apparent distress  Vitals:   08/15/21 1100 08/15/21 1200  BP: 122/61 133/60  Pulse: 70 74  Resp: 15 14  Temp:  97.6 F (36.4 C)  SpO2: 98% 98%   EYES: anicteric Respiratory: normal respiratory effort GI: soft Musculoskeletal: no  edema  Lab Results  Component Value Date   WBC 17.8 (H) 08/15/2021   HGB 8.4 (L) 08/15/2021   HCT 25.2 (L) 08/15/2021   MCV 89.0 08/15/2021   PLT 145 (L) 08/15/2021    Lab Results  Component Value Date   CREATININE 1.98 (H) 08/15/2021   BUN 143 (H) 08/15/2021   NA 148 (H) 08/15/2021   K 3.6 08/15/2021   CL 117 (H) 08/15/2021   CO2 22 08/15/2021    Lab Results  Component Value Date   ALT 51 (H) 08/10/2021   AST 61 (H) 08/10/2021   ALKPHOS 112 08/10/2021     Microbiology: No results found for this or any previous visit (from the past 240 hour(s)).  Thayer Headings, MD [email or web address] 08/15/2021, 1:41 PM

## 2021-08-15 NOTE — Progress Notes (Signed)
NAME:  Courtney BOOHER, MRN:  [id number], DOB:  15-Sep-1945, LOS: 38 ADMISSION DATE:  08/19/2021, CONSULTATION DATE:  08/04/21 REFERRING MD:  Dr. Armandina Gemma, ER, CHIEF COMPLAINT:  AMS   History of Present Illness:  76 yo female former smoker sent to ER with AMS and new Lt facial droop.  She was in hospital from 05/31/21 to 06/06/21 for AMS, septic shock with Klebsiella bacteremia, hypodense lesion in pancreatic head, and uncontrolled diabetes.  Reported to have fever at home.  Intubated in ER.  Started on ABx and pressors.  Neuro consulted in ER.  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  CT chest/abd/pelvis showed bronchiectasis with mucus plugging and basilar consolidation, improved appearance of pancreatic head, stool balls in rectum, and increasing lytic lesion in superior endplate wedge deformity of L1 with gas concerning for osteomyelitis. Found to have worsening anemia and PRBC ordered.  EEG and MRI imaging ordered.  PCCM asked to arrange for admission to ICU.  Hx from chart and medical team.  Pertinent  Medical History  DM type 2, HTN, HLD, CAD, CVA, Asthma/COPD, CKD 3, Anxiety with depression, Dementia  Significant Hospital Events: Including procedures, antibiotic start and stop dates in addition to other pertinent events   7/08 Admit, neuro consulted 7/9 Blood cultures positive for K.pneumoniae 7/8 Urine Cx + for > 100,000 colonies gram negative rods 7/10 Thoracic and lumbar spine: 1. 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 2. Small anterior epidural collection at T12 with left ventral epidural extension superiorly up to T9 suspicious for phlegmon and/or abscess. It is difficult to distinguish between the two without intravenous contrast. 3. 4.4 cm paraspinous fluid collection on the right at T11 and T12, suspicious for abscess. 4. Multilevel thoracolumbar spondylosis as  described above. Moderate to severe spinal canal and severe right neuroforaminal stenosis at L3-L4 7/11: platelets 8 transfused plt; off levo 7/13: EEG off; remains intubated. PLTs 21K. Weaning but mental status still barrier to extubation 7/18 extubated to [place]  Interim History / Subjective:  Remains extubated. She is confused and cannot answer questions.   Objective   Blood pressure (!) 127/57, pulse 74, temperature 97.9 F (36.6 C), temperature source Axillary, resp. rate 13, height '5\' 5"'$  (1.651 m), weight 70.9 kg, SpO2 100 %.        Intake/Output Summary (Last 24 hours) at 08/15/2021 1007 Last data filed at 08/15/2021 0800 Gross per 24 hour  Intake 2003 ml  Output 1600 ml  Net 403 ml    Filed Weights   08/13/21 0500 08/14/21 0500 08/15/21 0400  Weight: 74.7 kg 74.3 kg 70.9 kg    Examination: General: chronically ill appearing woman lying in bed in NAD HEENT [place]/AT, eyes anicteric Pulmonary: breathing comfortably on [place] Cardiac: S1S2, RRR Abdomen: obese, soft, NT Extremities: ongoing anasarca Neuro Grimacing and occasionally moaning during exam. Some nonpurposeful movement of UEs Derm: warm, dry, no diffuse rashes.   WBC 17.8 H/H 8.4/25.2 Platelets 145 Sodium 148 Potassium 3.6 BUN 143 Creatinine 1.98 Sodium 148  Resolved medical issues:   Stool impaction Septic shock resolved  Assessment & Plan:   Active Problems:   CKD (chronic kidney disease), stage III (HCC)   Mixed hyperlipidemia due to type 2 diabetes mellitus (HCC)   Klebsiella pneumoniae sepsis ([place])   Vascular dementia without behavioral disturbance, psychotic disturbance, mood disturbance, or anxiety (HCC)   Pressure injury of skin   Pneumonia due  to infectious organism   Malnutrition of moderate degree   Osteomyelitis of vertebra, multiple sites in spine ([place])   Bacteremia due to Gram-negative bacteria   Encephalopathy acute due to sepsis   Acute respiratory failure (HCC)   Multi-focal acute  metabolic encephalopathy (sepsis w/ CNS involvement) superimposed on h/o of CVA, advanced dementia, anxiety, depression. -Con't supportive care. Limit potentially sedating meds to facilitate maximal recovery. -Appreciate neurology's input- I agree this is metabolic. Very worried about limited functional recovery due to baseline debility and prolonged critical illness.  Sepsis due to recurrent K. pneumoniae bacteremia with spinal osteomyelitis and phlegmon formation.  T9?-L1 epidural abscess, T12-L1 pathologic fracture with osteomyelitis and discitis.  Paraspinous fluid collection T11-T12 -Reviewed with neurosurgery on admission day, deemed not a surgical candidate due to severe thrombocytopenia Plan -Con't IV ceftriaxone -ID consult -She unfortunately remains high risk for paraplegia with spinal abscesses, but has been moving her legs still.  Acute hypoxic respiratory failure with compromised airway> extubated 7/18 Hx of COPD/asthma and bronchiectasis. -supplemental O2 to maintain SpO2 >90% --remains high risk for pulmonary decompensation with ongoing decreased baseline mental status  Hyperkalemia, resolved Hypernatremia NAGMA Anasarca, low protein stores AKI -Worsening azotemia, now with increasing Cr -increase FWF too 300cc Q4h -monitor  Thrombocytopenia: likely sepsis related, slowly improving -monitor -ok to con't DVT prophylaxis  Anemia of critical illness, possible GI bleed due to low HB significant azotemia> but no blood seen  -Transfuse for hemoglobin less than 7 or hemodynamically significant bleeding.  Acute on chronic renal failure secondary to sepsis/septic shock CKD 3b. - -Nephrology consult.  Unfortunately I do not think she would be a great dialysis candidate due to very severely decreased baseline performance status. -Strict I's/O - Renally dose medications and avoid nephrotoxic meds. - Monitor for urinary retention.  Severe protein calorie  malnutrition -Continue tube feeding  Hyperglycemia -No longer requiring long-acting insulin - Sliding scale insulin as needed - Goal blood glucose 140-180  Pressure injury Stage 3 of Sacrum, present on admission  Plan -Wound care and frequent turns   Daugher [name] updated over the phone. Ms. Granderson is stable to move to progressive care today. TRH to assume care tomorrow.   Best Practice (right click and "Reselect all SmartList Selections" daily)   Diet/type: tubefeeds  DVT prophylaxis: prophylactic heparin   GI prophylaxis: PPI Lines: Central line Foley:  N/A Code Status:  full code Last date of multidisciplinary goals of care discussion [7/18 -con't current aggressive care, wants trach if she fails ]  Julian Hy, DO 08/15/21 1:32 PM [facility]

## 2021-08-16 DIAGNOSIS — R7881 Bacteremia: Secondary | ICD-10-CM | POA: Diagnosis not present

## 2021-08-16 DIAGNOSIS — L89153 Pressure ulcer of sacral region, stage 3: Secondary | ICD-10-CM

## 2021-08-16 DIAGNOSIS — J449 Chronic obstructive pulmonary disease, unspecified: Secondary | ICD-10-CM

## 2021-08-16 DIAGNOSIS — A414 Sepsis due to anaerobes: Secondary | ICD-10-CM

## 2021-08-16 DIAGNOSIS — G9340 Encephalopathy, unspecified: Secondary | ICD-10-CM | POA: Diagnosis not present

## 2021-08-16 DIAGNOSIS — R1310 Dysphagia, unspecified: Secondary | ICD-10-CM

## 2021-08-16 DIAGNOSIS — R601 Generalized edema: Secondary | ICD-10-CM

## 2021-08-16 DIAGNOSIS — N1832 Chronic kidney disease, stage 3b: Secondary | ICD-10-CM

## 2021-08-16 DIAGNOSIS — D638 Anemia in other chronic diseases classified elsewhere: Secondary | ICD-10-CM

## 2021-08-16 DIAGNOSIS — J9601 Acute respiratory failure with hypoxia: Secondary | ICD-10-CM | POA: Diagnosis not present

## 2021-08-16 DIAGNOSIS — E1122 Type 2 diabetes mellitus with diabetic chronic kidney disease: Secondary | ICD-10-CM

## 2021-08-16 LAB — RENAL FUNCTION PANEL
Albumin: 1.7 g/dL — ABNORMAL LOW (ref 3.5–5.0)
Anion gap: 10 (ref 5–15)
BUN: 145 mg/dL — ABNORMAL HIGH (ref 8–23)
CO2: 22 mmol/L (ref 22–32)
Calcium: 9.4 mg/dL (ref 8.9–10.3)
Chloride: 114 mmol/L — ABNORMAL HIGH (ref 98–111)
Creatinine, Ser: 1.78 mg/dL — ABNORMAL HIGH (ref 0.44–1.00)
GFR, Estimated: 29 mL/min — ABNORMAL LOW (ref >60)
Glucose, Bld: 220 mg/dL — ABNORMAL HIGH (ref 70–99)
Phosphorus: 3.6 mg/dL (ref 2.5–4.6)
Potassium: 3.8 mmol/L (ref 3.5–5.1)
Sodium: 146 mmol/L — ABNORMAL HIGH (ref 135–145)

## 2021-08-16 LAB — CBC
HCT: 27.2 % — ABNORMAL LOW (ref 36.0–46.0)
Hemoglobin: 8.8 g/dL — ABNORMAL LOW (ref 12.0–15.0)
MCH: 29.7 pg (ref 26.0–34.0)
MCHC: 32.4 g/dL (ref 30.0–36.0)
MCV: 91.9 fL (ref 80.0–100.0)
Platelets: 161 10*3/uL (ref 150–400)
RBC: 2.96 MIL/uL — ABNORMAL LOW (ref 3.87–5.11)
RDW: 19.7 % — ABNORMAL HIGH (ref 11.5–15.5)
WBC: 13.6 10*3/uL — ABNORMAL HIGH (ref 4.0–10.5)
nRBC: 0 % (ref 0.0–0.2)

## 2021-08-16 LAB — GLUCOSE, CAPILLARY
Glucose-Capillary: 157 mg/dL — ABNORMAL HIGH (ref 70–99)
Glucose-Capillary: 193 mg/dL — ABNORMAL HIGH (ref 70–99)
Glucose-Capillary: 195 mg/dL — ABNORMAL HIGH (ref 70–99)
Glucose-Capillary: 202 mg/dL — ABNORMAL HIGH (ref 70–99)
Glucose-Capillary: 229 mg/dL — ABNORMAL HIGH (ref 70–99)
Glucose-Capillary: 234 mg/dL — ABNORMAL HIGH (ref 70–99)

## 2021-08-16 LAB — MAGNESIUM: Magnesium: 1.9 mg/dL (ref 1.7–2.4)

## 2021-08-16 MED ORDER — ALBUMIN HUMAN 25 % IV SOLN
25.0000 g | Freq: Two times a day (BID) | INTRAVENOUS | Status: AC
  Administered 2021-08-16 – 2021-08-18 (×4): 25 g via INTRAVENOUS
  Filled 2021-08-16 (×4): qty 100

## 2021-08-16 MED ORDER — CEFADROXIL 500 MG PO CAPS
500.0000 mg | ORAL_CAPSULE | Freq: Two times a day (BID) | ORAL | Status: DC
  Administered 2021-08-16 – 2021-08-17 (×2): 500 mg via ORAL
  Filled 2021-08-16 (×2): qty 1

## 2021-08-16 MED ORDER — DARBEPOETIN ALFA 60 MCG/0.3ML IJ SOSY
60.0000 ug | PREFILLED_SYRINGE | INTRAMUSCULAR | Status: DC
  Administered 2021-08-16 – 2021-08-23 (×2): 60 ug via SUBCUTANEOUS
  Filled 2021-08-16 (×2): qty 0.3

## 2021-08-16 NOTE — Progress Notes (Addendum)
PROGRESS NOTE        PATIENT DETAILS Name: Courtney Goodman Age: 76 y.o. Sex: female Date of Birth: [birth year] Admit Date: 08/02/2021 Admitting Physician Chesley Mires, MD VOZ:[id number], Mare Ferrari, PA-C  Brief Summary: Patient is a 76 y.o.  female history of DM-2, HTN, HLD, CVA, anxiety/depression, dementia who was admitted to the ICU with acute hypoxic respiratory failure and septic shock due to Klebsiella bacteremia-found to have epidural abscesses (not a surgical candidate).  Had a prolonged ICU stay complicated by severe encephalopathy, AKI-stabilized and subsequently transferred to [facility] service on 7/20.  See below for further details.   Significant events: 7/08>> admit to ICU-septic shock/respiratory failure.  Intubated. 7/10>> Thoracic and lumbar spine: Osteomyelitis/discitis and epidural abscess.  7/18>> extubated.   Significant studies: 7/8>> CT head: No acute intracranial pathology. 7/8>> CT C-spine: No fracture/subluxation 7/8>> CT abdomen/pelvis: Lytic appearance of superior endplate L1-gas within the disc space concerning for discitis/osteomyelitis.  Previously seen pancreatic head lesion appears resolved. 7/9>> MRI brain: Please a few small acute infarcts in the left frontoparietal cortex. 7/9>> MRI C-spine: No osteomyelitis/discitis. 7/9>> MRI thoracic/lumbar spine: Osteomyelitis/discitis at T12-L1, small epidural collection at T12 with ventral epidural extension of the T9.  4.4 paraspinous fluid collection at T11, T12 suspicious for abscess. 7/9>> Echo: EF 74-25%, grade 1 diastolic dysfunction. 7/11>> LTM EEG: No seizures. 7/13>> Spot EEG: No seizures. 7/19>> renal ultrasound: No hydronephrosis.  Significant microbiology data:   Procedures: 7/8-7/18>> ETT 7/10>> cortrak placed  Consults: PCCM ID Nephrology Neurology Palliative care  Subjective: Debilitated-mumbles-NG tube in place.  Not in any distress.  Objective: Vitals: Blood  pressure (!) 147/67, pulse 75, temperature 98.7 F (37.1 C), temperature source Oral, resp. rate 13, height '5\' 5"'$  (1.651 m), weight 70.9 kg, SpO2 99 %.   Exam: Gen Exam:awake-not in any distress.  Looks extremely debilitated and deconditioned. HEENT:atraumatic, normocephalic Chest: B/L clear to auscultation anteriorly CVS:S1S2 regular Abdomen:soft non tender, non distended Extremities:no edema Neurology: Difficult exam but seems to be moving all 4 extremities. Skin: no rash  Pertinent Labs/Radiology:    Latest Ref Rng & Units 08/16/2021    4:20 AM 08/15/2021    4:42 AM 08/14/2021    5:10 AM  CBC  WBC 4.0 - 10.5 K/uL 13.6  17.8  19.5   Hemoglobin 12.0 - 15.0 g/dL 8.8  8.4  7.8   Hematocrit 36.0 - 46.0 % 27.2  25.2  23.8   Platelets 150 - 400 K/uL 161  145  113     Lab Results  Component Value Date   NA 148 (H) 08/15/2021   K 3.6 08/15/2021   CL 117 (H) 08/15/2021   CO2 22 08/15/2021      Assessment/Plan: Septic shock due to Klebsiella pneumonia bacteremia with T12-L1 osteomyelitis epidural abscess/paraspinal collection: Sepsis physiology has resolved-Per PCCM note-discussed with neurosurgery on admit-not a candidate for operative drainage due to thrombocytopenia-suspect now she is so debilitated/encephalopathic-and may not benefit from surgical intervention-however given improvement in thrombocytopenia-we will formally get a neurosurgical opinion.  Seems to be moving all 4 extremities-although exam is difficult due to encephalopathy and significant debility and deconditioning.  ID following with recommendations to transition to cefadroxil lifelong.  Acute metabolic encephalopathy: In the setting of sepsis-prolonged ICU delirium-superimposed on advanced dementia.  Neuroimaging/EEG as above.  Plan is to continue supportive care.  PCCM has had extensive discussion  with family-given poor overall prognosis-and limited functional recovery from prolonged critical illness.  Neurology  followed during the earlier part of this hospitalization-they have subsequently signed off.  Acute hypoxic respiratory failure due to compromised airway: Extubated on 7/18-currently on room air or on minimal amounts of oxygen.  Continue supportive care.   AKI on CKD stage IIIb: AKI felt to be hemodynamically mediated-Nephrology following-hold all nephrotoxic agents.  Anasarca/volume overload: Due to hypoalbuminemia-supportive care-repeat electrolytes tomorrow-once renal function stabilizes-May need to initiate Lasix.  Dysphagia: Due to critical illness-encephalopathy-severe muscular deconditioning-NG tube in place-continue tube feeds.  Do not think she is ready for any sort of oral intake at this point.  Anemia due to critical illness/CKD: No overt bleeding-follow-transfuse if significant drop.  Thrombocytopenia: Due to sepsis-resolved.  Acute CVA: Mostly incidental finding on MRI-Per last neurology note on 7/15-further work-up can be considered if her clinical/lab situation improves.  COPD: Not in exacerbation-continue bronchodilators.  DM-2: CBG stable with SSI.  Recent Labs    08/16/21 0025 08/16/21 0427 08/16/21 0920  GLUCAP 234* 193* 157*    HTN: BP stable-off all antihypertensives.  Resume when able.  Anxiety/depression: Currenttly encephalopathic-minimize sedatives at this point.  Will need to resume Lexapro when she is a bit more stable.  Dementia: See above regarding encephalopathy.  Severe debility/deconditioning: We will need PT/OT evaluation.  Likely SNF on discharge.  Goals of care/palliative care: Full code-while she was in the ICU-extensive discussion was done by [facility] MD/palliative care with family-who wished to continue full scope of treatment-they are aware of poor overall prognosis.  Nutrition Status: Nutrition Problem: Moderate Malnutrition Etiology: chronic illness Signs/Symptoms: moderate fat depletion, moderate muscle depletion Interventions: Refer to RD  note for recommendations, Tube feeding   Pressure Ulcer: Pressure Injury 08/22/2021 Coccyx Right;Mid Stage 3 -  Full thickness tissue loss. Subcutaneous fat may be visible but bone, tendon or muscle are NOT exposed. Stage 2/3 wound to right sacrum, oval in shape, measuring 1.5cm x 2.5 cm. No tunneling noted. Ful (Active)  08/09/2021 1730  Location: Coccyx  Location Orientation: Right;Mid  Staging: Stage 3 -  Full thickness tissue loss. Subcutaneous fat may be visible but bone, tendon or muscle are NOT exposed.  Wound Description (Comments): Stage 2/3 wound to right sacrum, oval in shape, measuring 1.5cm x 2.5 cm. No tunneling noted. Full thickness tissue loss with possible muscle exposure (difficult to stage). Wound bed yellow, pink, red and dark brown in color. Fissures noted beneath the wound in gluteal cleft.  Present on Admission: Yes  Dressing Type Foam - Lift dressing to assess site every shift 08/15/21 0730    BMI: Estimated body mass index is 26.01 kg/m as calculated from the following:   Height as of this encounter: '5\' 5"'$  (1.651 m).   Weight as of this encounter: 70.9 kg.   Code status:   Code Status: Full Code   DVT Prophylaxis: heparin injection 5,000 Units Start: 08/12/21 1400 Place and maintain sequential compression device Start: 08/03/2021 1510   Family Communication: None at bedside   Disposition Plan: Status is: Inpatient Remains inpatient appropriate because: Septic shock-Klebsiella bacteremia-epidural abscess/osteomyelitis-severe encephalopathy-remains n.p.o.-NG tube in place.  Not yet stable for discharge.   Planned Discharge Destination:Skilled nursing facility   Diet: Diet Order             Diet NPO time specified  Diet effective now                     Antimicrobial agents: Anti-infectives (  From admission, onward)    Start     Dose/Rate Route Frequency Ordered Stop   08/16/21 2200  cefadroxil (DURICEF) capsule 500 mg        500 mg Oral 2 times  daily 08/16/21 0917     08/06/21 1000  cefTRIAXone (ROCEPHIN) 2 g in sodium chloride 0.9 % 100 mL IVPB  Status:  Discontinued        2 g 200 mL/hr over 30 Minutes Intravenous Every 12 hours 08/06/21 0757 08/16/21 0917   08/05/21 1400  ceFEPIme (MAXIPIME) 1 g in sodium chloride 0.9 % 100 mL IVPB  Status:  Discontinued        1 g 200 mL/hr over 30 Minutes Intravenous Every 24 hours 08/10/2021 1302 08/05/21 0752   08/05/21 1400  ceFEPIme (MAXIPIME) 2 g in sodium chloride 0.9 % 100 mL IVPB  Status:  Discontinued        2 g 200 mL/hr over 30 Minutes Intravenous Every 24 hours 08/05/21 0752 08/05/21 1048   08/05/21 1200  ceFEPIme (MAXIPIME) 2 g in sodium chloride 0.9 % 100 mL IVPB  Status:  Discontinued        2 g 200 mL/hr over 30 Minutes Intravenous Every 24 hours 08/05/21 1048 08/06/21 0757   08/15/2021 1945  vancomycin (VANCOREADY) IVPB 1250 mg/250 mL        1,250 mg 166.7 mL/hr over 90 Minutes Intravenous  Once 08/02/2021 1851 08/05/21 1045   08/01/2021 1315  vancomycin (VANCOREADY) IVPB 1250 mg/250 mL  Status:  Discontinued        1,250 mg 166.7 mL/hr over 90 Minutes Intravenous  Once 08/10/2021 1302 08/24/2021 1851   08/23/2021 1301  vancomycin variable dose per unstable renal function (pharmacist dosing)  Status:  Discontinued         Does not apply See admin instructions 08/07/2021 1302 08/05/21 1125   07/28/2021 1300  ceFEPIme (MAXIPIME) 2 g in sodium chloride 0.9 % 100 mL IVPB        2 g 200 mL/hr over 30 Minutes Intravenous  Once 08/27/2021 1252 08/05/21 0255   08/27/2021 1300  metroNIDAZOLE (FLAGYL) IVPB 500 mg  Status:  Discontinued        500 mg 100 mL/hr over 60 Minutes Intravenous  Once 08/20/2021 1252 08/05/21 1125   08/19/2021 1300  vancomycin (VANCOCIN) IVPB 1000 mg/200 mL premix  Status:  Discontinued        1,000 mg 200 mL/hr over 60 Minutes Intravenous  Once 08/05/2021 1252 08/08/2021 1302        MEDICATIONS: Scheduled Meds:  cefadroxil  500 mg Oral BID   Chlorhexidine Gluconate Cloth  6  each Topical Daily   docusate  100 mg Per Tube BID   free water  300 mL Per Tube Q4H   heparin injection (subcutaneous)  5,000 Units Subcutaneous Q8H   insulin aspart  0-15 Units Subcutaneous Q4H   insulin aspart  2 Units Subcutaneous Q4H   nutrition supplement (JUVEN)  1 packet Per Tube BID BM   mouth rinse  15 mL Mouth Rinse 4 times per day   pantoprazole  40 mg Intravenous Q12H   polyethylene glycol  17 g Per Tube Daily   sodium chloride flush  10-40 mL Intracatheter Q12H   Continuous Infusions:  sodium chloride 250 mL (08/15/21 0945)   feeding supplement (VITAL AF 1.2 CAL) 60 mL/hr at 08/16/21 0225   PRN Meds:.dextrose, fentaNYL (SUBLIMAZE) injection, hydrALAZINE, ipratropium-albuterol, mouth rinse, mouth rinse, sodium chloride flush, white petrolatum  I have personally reviewed following labs and imaging studies  LABORATORY DATA: CBC: Recent Labs  Lab 08/12/21 0432 08/13/21 0505 08/14/21 0510 08/15/21 0442 08/16/21 0420  WBC 21.1* 15.5* 19.5* 17.8* 13.6*  HGB 8.7* 8.8* 7.8* 8.4* 8.8*  HCT 25.3* 25.9* 23.8* 25.2* 27.2*  MCV 85.2 87.8 89.5 89.0 91.9  PLT 81* 80* 113* 145* 962    Basic Metabolic Panel: Recent Labs  Lab 08/11/21 1558 08/12/21 0432 08/12/21 0800 08/13/21 0505 08/14/21 0510 08/15/21 0442 08/16/21 0420  NA 145  --  149* 146* 145 148*  --   K 5.9*  --  4.7 4.0 4.2 3.6  --   CL 116*  --  119* 117* 113* 117*  --   CO2 19*  --  20* 22 21* 22  --   GLUCOSE 270*  --  100* 58* 263* 164*  --   BUN 100*  --  111* 115* 137* 143*  --   CREATININE 1.36*  --  1.44* 1.42* 1.75* 1.98*  --   CALCIUM 8.7*  --  9.1 9.3 9.5 9.4  --   MG 2.0  --  2.2 2.2 2.1 2.1 1.9  PHOS 4.3 3.9 3.8 3.4 4.1  --   --     GFR: Estimated Creatinine Clearance: 23.9 mL/min (A) (by C-G formula based on SCr of 1.98 mg/dL (H)).  Liver Function Tests: Recent Labs  Lab 08/10/21 0451  AST 61*  ALT 51*  ALKPHOS 112  BILITOT 3.0*  PROT 5.2*  ALBUMIN <1.5*   No results for  input(s): "LIPASE", "AMYLASE" in the last 168 hours. No results for input(s): "AMMONIA" in the last 168 hours.  Coagulation Profile: No results for input(s): "INR", "PROTIME" in the last 168 hours.  Cardiac Enzymes: No results for input(s): "CKTOTAL", "CKMB", "CKMBINDEX", "TROPONINI" in the last 168 hours.  BNP (last 3 results) No results for input(s): "PROBNP" in the last 8760 hours.  Lipid Profile: No results for input(s): "CHOL", "HDL", "LDLCALC", "TRIG", "CHOLHDL", "LDLDIRECT" in the last 72 hours.  Thyroid Function Tests: No results for input(s): "TSH", "T4TOTAL", "FREET4", "T3FREE", "THYROIDAB" in the last 72 hours.  Anemia Panel: No results for input(s): "VITAMINB12", "FOLATE", "FERRITIN", "TIBC", "IRON", "RETICCTPCT" in the last 72 hours.  Urine analysis:    Component Value Date/Time   COLORURINE AMBER (A) 08/15/2021 1550   APPEARANCEUR TURBID (A) 08/15/2021 1550   LABSPEC 1.014 08/15/2021 1550   LABSPEC 1.020 10/12/2018 1055   PHURINE 5.0 08/15/2021 1550   GLUCOSEU NEGATIVE 08/15/2021 1550   HGBUR NEGATIVE 08/15/2021 1550   BILIRUBINUR NEGATIVE 08/15/2021 1550   BILIRUBINUR negative 10/12/2018 1055   BILIRUBINUR n 07/01/2016 1139   KETONESUR NEGATIVE 08/15/2021 1550   PROTEINUR 100 (A) 08/15/2021 1550   UROBILINOGEN negative (A) 07/01/2016 1139   NITRITE NEGATIVE 08/15/2021 1550   LEUKOCYTESUR LARGE (A) 08/15/2021 1550    Sepsis Labs: Lactic Acid, Venous    Component Value Date/Time   LATICACIDVEN 1.4 08/09/2021 0401    MICROBIOLOGY: No results found for this or any previous visit (from the past 240 hour(s)).  RADIOLOGY STUDIES/RESULTS: US RENAL  Result Date: 08/15/2021 CLINICAL DATA:  Acute kidney injury. EXAM: RENAL / URINARY TRACT ULTRASOUND COMPLETE COMPARISON:  MRI 06/04/2021 FINDINGS: Right Kidney: Renal measurements: 9.9 x 5.5 x 4.9 cm = volume: 137 mL. Slightly increased echogenicity. No mass, stone or hydronephrosis. Left Kidney: Renal  measurements: 10.0 x 5.9 x 5.5 cm = volume: 170 mL. Slight increased echogenicity. No mass, stone or hydronephrosis. Bladder:  Some debris like material in the bladder. Other: Small amount of ascites.  Bilateral pleural effusions. IMPRESSION: Normal sized kidneys. No obstruction. Slight increased parenchymal echogenicity consistent with renal parenchymal disease. Bilateral pleural effusions and ascites. Electronically Signed   By: Nelson Chimes M.D.   On: 08/15/2021 16:06     LOS: 12 days   Oren Binet, MD  [facility]    To contact the attending provider between 7A-7P or the covering provider during after hours 7P-7A, please log into the web site [email or web address] and access using universal [place] password for that web site. If you do not have the password, please call the hospital operator.  08/16/2021, 10:24 AM

## 2021-08-16 NOTE — Care Management Important Message (Signed)
Important Message  Patient Details  Name: Courtney Goodman MRN: [id number] Date of Birth: 04/23/1945   Medicare Important Message Given:  Yes     Orbie Pyo 08/16/2021, 2:42 PM

## 2021-08-16 NOTE — Progress Notes (Signed)
[facility] Progress Note    Assessment/ Plan:   AKI on CKD 4 (nonoliguric): Likely secondary to sepsis & diuresis. Suspecting she is intravascularly dry -underlying CKD likely related to diabetic kidney disease, followed by Dr. Johnney Ou in the office, last Cr in April 2023 was 2.06 w/ eGFR of 25 which seems to be around her baseline -overall, she is not an ideal long term dialysis candidate given underlying dementia and severe illness (epidural abscess). Will give her kidney function a chance to recover w/ med mgmt -FWF inc'ed on 7/19 -Cr slightly better, BUN persistently elevated which could be related to both dehydration and her being cachectic -will trial albumin infusion for today and tomorrow -Continue to monitor daily Cr, Dose meds for GFR<15 -Maintain MAP>65 for optimal renal perfusion.  -Avoid nephrotoxic medications including NSAIDs and iodinated intravenous contrast exposure unless the latter is absolutely indicated.  Preferred narcotic agents for pain control are hydromorphone, fentanyl, and methadone. Morphine should not be used. Avoid Baclofen and avoid oral sodium phosphate and magnesium citrate based laxatives / bowel preps. Continue strict Input and Output monitoring. Will monitor the patient closely with you and intervene or adjust therapy as indicated by changes in clinical status/labs    Sepsis secondary to urinary source (K. Pneumoniae) w/ spinal OM and epidural abscess, discitis -abx per primary service -not a surgical candidate due to severe thrombocytopenia   AHRF -secondary to compromised airway, extubated 7/18 -high risk for re-intubation   Toxic metabolic encephalopathy -has been seen by neurology. CNS involvement from CNS superimposed on h/o CVA, advanced dementia   Hypertension: -BPs are currently acceptable   Hypernatremia -FWF inc'ed 7/19, na improving   Anemia due to chronic disease: -Transfuse for Hgb<7 g/dL -avoid IV iron in the  context of sepsis/bacteremia -starting ESA 7/20   Diabetes Mellitus Type 2 with Hyperglycemia -Per primary service   Thrombocytopenia -likely secondary to sepsis, per primary -plt ct improving, now wnl  Severe protein calorie malnutrition -per primary  Subjective:   No acute events. Uop 1.5L. discussed with one of the daughters at the bedside. Labs pending at the time of my encounter with the patient.   Objective:   BP (!) 147/67 (BP Location: Right Arm)   Pulse 75   Temp 98.7 F (37.1 C) (Oral)   Resp 13   Ht 5' 5" (1.651 m)   Wt 70.9 kg   SpO2 99%   BMI 26.01 kg/m   Intake/Output Summary (Last 24 hours) at 08/16/2021 1152 Last data filed at 08/16/2021 0600 Gross per 24 hour  Intake 1340.43 ml  Output 1570 ml  Net -229.57 ml   Weight change:   Physical Exam: Gen:ill appearing, frail CVS:RRR Resp:cta bl HAL:PFXT KWI:[id number] Neuro: not following commands, tracking, moaning+grimacing intermittently, moves all ext spontaneously  Imaging: US RENAL  Result Date: 08/15/2021 CLINICAL DATA:  Acute kidney injury. EXAM: RENAL / URINARY TRACT ULTRASOUND COMPLETE COMPARISON:  MRI 06/04/2021 FINDINGS: Right Kidney: Renal measurements: 9.9 x 5.5 x 4.9 cm = volume: 137 mL. Slightly increased echogenicity. No mass, stone or hydronephrosis. Left Kidney: Renal measurements: 10.0 x 5.9 x 5.5 cm = volume: 170 mL. Slight increased echogenicity. No mass, stone or hydronephrosis. Bladder: Some debris like material in the bladder. Other: Small amount of ascites.  Bilateral pleural effusions. IMPRESSION: Normal sized kidneys. No obstruction. Slight increased parenchymal echogenicity consistent with renal parenchymal disease. Bilateral pleural effusions and ascites. Electronically Signed   By: Nelson Chimes M.D.   On: 08/15/2021 16:06  Labs: BMET Recent Labs  Lab 08/11/21 0800 08/11/21 1558 08/12/21 0432 08/12/21 0800 08/13/21 0505 08/14/21 0510 08/15/21 0442 08/16/21 0420   NA 144 145  --  149* 146* 145 148* 146*  K 5.4* 5.9*  --  4.7 4.0 4.2 3.6 3.8  CL 117* 116*  --  119* 117* 113* 117* 114*  CO2 19* 19*  --  20* 22 21* 22 22  GLUCOSE 195* 270*  --  100* 58* 263* 164* 220*  BUN 91* 100*  --  111* 115* 137* 143* 145*  CREATININE 1.45* 1.36*  --  1.44* 1.42* 1.75* 1.98* 1.78*  CALCIUM 8.9 8.7*  --  9.1 9.3 9.5 9.4 9.4  PHOS 4.4 4.3 3.9 3.8 3.4 4.1  --  3.6   CBC Recent Labs  Lab 08/13/21 0505 08/14/21 0510 08/15/21 0442 08/16/21 0420  WBC 15.5* 19.5* 17.8* 13.6*  HGB 8.8* 7.8* 8.4* 8.8*  HCT 25.9* 23.8* 25.2* 27.2*  MCV 87.8 89.5 89.0 91.9  PLT 80* 113* 145* 161    Medications:     cefadroxil  500 mg Oral BID   Chlorhexidine Gluconate Cloth  6 each Topical Daily   docusate  100 mg Per Tube BID   free water  300 mL Per Tube Q4H   heparin injection (subcutaneous)  5,000 Units Subcutaneous Q8H   insulin aspart  0-15 Units Subcutaneous Q4H   insulin aspart  2 Units Subcutaneous Q4H   nutrition supplement (JUVEN)  1 packet Per Tube BID BM   mouth rinse  15 mL Mouth Rinse 4 times per day   pantoprazole  40 mg Intravenous Q12H   polyethylene glycol  17 g Per Tube Daily   sodium chloride flush  10-40 mL Intracatheter Q12H      Gean Quint, MD [facility] 08/16/2021, 11:52 AM

## 2021-08-16 NOTE — Inpatient Diabetes Management (Signed)
Inpatient Diabetes Program Recommendations  AACE/ADA: New Consensus Statement on Inpatient Glycemic Control (2015)  Target Ranges:  Prepandial:   less than 140 mg/dL      Peak postprandial:   less than 180 mg/dL (1-2 hours)      Critically ill patients:  140 - 180 mg/dL    Latest Reference Range & Units 08/16/21 00:25 08/16/21 04:27 08/16/21 09:20  Glucose-Capillary 70 - 99 mg/dL 234 (H) 193 (H) 157 (H)    Latest Reference Range & Units 08/15/21 07:34 08/15/21 11:12 08/15/21 16:20 08/15/21 21:25  Glucose-Capillary 70 - 99 mg/dL 126 (H) 131 (H) 160 (H) 216 (H)   Review of Glycemic Control  Current orders for Inpatient glycemic control: Novolog 0-15 units Q4H, Novolog 2 units Q4H for tube feeding coverage; Vital @ 60 ml/hr  Inpatient Diabetes Program Recommendations:    Insulin: If glucose remains consistently over 180 mg/dl, please consider increasing Novolog tube feeding coverage to Novolog 4 units Q4H.  Thanks, Barnie Alderman, RN, MSN, [facility] Coordinator Inpatient Diabetes Program [phone] (Team Pager from 8am to [place])

## 2021-08-17 DIAGNOSIS — G9340 Encephalopathy, unspecified: Secondary | ICD-10-CM | POA: Diagnosis not present

## 2021-08-17 DIAGNOSIS — J9601 Acute respiratory failure with hypoxia: Secondary | ICD-10-CM | POA: Diagnosis not present

## 2021-08-17 DIAGNOSIS — A414 Sepsis due to anaerobes: Secondary | ICD-10-CM | POA: Diagnosis not present

## 2021-08-17 DIAGNOSIS — R7881 Bacteremia: Secondary | ICD-10-CM | POA: Diagnosis not present

## 2021-08-17 LAB — CBC
HCT: 23.8 % — ABNORMAL LOW (ref 36.0–46.0)
Hemoglobin: 7.7 g/dL — ABNORMAL LOW (ref 12.0–15.0)
MCH: 29.6 pg (ref 26.0–34.0)
MCHC: 32.4 g/dL (ref 30.0–36.0)
MCV: 91.5 fL (ref 80.0–100.0)
Platelets: 137 10*3/uL — ABNORMAL LOW (ref 150–400)
RBC: 2.6 MIL/uL — ABNORMAL LOW (ref 3.87–5.11)
RDW: 19.3 % — ABNORMAL HIGH (ref 11.5–15.5)
WBC: 12.6 10*3/uL — ABNORMAL HIGH (ref 4.0–10.5)
nRBC: 0 % (ref 0.0–0.2)

## 2021-08-17 LAB — GLUCOSE, CAPILLARY
Glucose-Capillary: 132 mg/dL — ABNORMAL HIGH (ref 70–99)
Glucose-Capillary: 154 mg/dL — ABNORMAL HIGH (ref 70–99)
Glucose-Capillary: 171 mg/dL — ABNORMAL HIGH (ref 70–99)
Glucose-Capillary: 192 mg/dL — ABNORMAL HIGH (ref 70–99)

## 2021-08-17 LAB — COMPREHENSIVE METABOLIC PANEL
ALT: 52 U/L — ABNORMAL HIGH (ref 0–44)
AST: 36 U/L (ref 15–41)
Albumin: 2.5 g/dL — ABNORMAL LOW (ref 3.5–5.0)
Alkaline Phosphatase: 112 U/L (ref 38–126)
Anion gap: 9 (ref 5–15)
BUN: 139 mg/dL — ABNORMAL HIGH (ref 8–23)
CO2: 23 mmol/L (ref 22–32)
Calcium: 9.7 mg/dL (ref 8.9–10.3)
Chloride: 115 mmol/L — ABNORMAL HIGH (ref 98–111)
Creatinine, Ser: 1.54 mg/dL — ABNORMAL HIGH (ref 0.44–1.00)
GFR, Estimated: 35 mL/min — ABNORMAL LOW (ref >60)
Glucose, Bld: 144 mg/dL — ABNORMAL HIGH (ref 70–99)
Potassium: 3.7 mmol/L (ref 3.5–5.1)
Sodium: 147 mmol/L — ABNORMAL HIGH (ref 135–145)
Total Bilirubin: 1.2 mg/dL (ref 0.3–1.2)
Total Protein: 5.9 g/dL — ABNORMAL LOW (ref 6.5–8.1)

## 2021-08-17 LAB — MAGNESIUM: Magnesium: 2 mg/dL (ref 1.7–2.4)

## 2021-08-17 MED ORDER — CEFAZOLIN SODIUM-DEXTROSE 2-4 GM/100ML-% IV SOLN
2.0000 g | Freq: Two times a day (BID) | INTRAVENOUS | Status: DC
Start: 2021-08-17 — End: 2021-08-24
  Administered 2021-08-17 – 2021-08-24 (×15): 2 g via INTRAVENOUS
  Filled 2021-08-17 (×15): qty 100

## 2021-08-17 MED ORDER — PANTOPRAZOLE 2 MG/ML SUSPENSION
40.0000 mg | Freq: Two times a day (BID) | ORAL | Status: DC
  Administered 2021-08-17 – 2021-08-24 (×14): 40 mg
  Filled 2021-08-17 (×14): qty 20

## 2021-08-17 MED ORDER — SODIUM CHLORIDE 0.9 % IV SOLN: 2.0000 g | Freq: Two times a day (BID) | INTRAVENOUS | Status: DC

## 2021-08-17 MED ORDER — FREE WATER
400.0000 mL | Status: DC
  Administered 2021-08-17 – 2021-08-21 (×25): 400 mL

## 2021-08-17 NOTE — Progress Notes (Signed)
PROGRESS NOTE        PATIENT DETAILS Name: Courtney Goodman Age: 76 y.o. Sex: female Date of Birth: [birth year] Admit Date: 08/02/2021 Admitting Physician Chesley Mires, MD EEF:[id number], Mare Ferrari, PA-C  Brief Summary: Patient is a 76 y.o.  female history of DM-2, HTN, HLD, CVA, anxiety/depression, dementia who was admitted to the ICU with acute hypoxic respiratory failure and septic shock due to Klebsiella bacteremia-found to have epidural abscesses (not a surgical candidate).  Had a prolonged ICU stay complicated by severe encephalopathy, AKI-stabilized and subsequently transferred to [facility] service on 7/20.  See below for further details.   Significant events: 7/08>> admit to ICU-septic shock/respiratory failure.  Intubated. 7/10>> Thoracic and lumbar spine: Osteomyelitis/discitis and epidural abscess.  PCCM discussed with neurosurgery-not a surgical candidate. 7/18>> extubated. 7/20>> transferred to [facility].  Rediscussed case with neurosurgery-Joshua Glenford Peers, NP-not a Surgical candidate.  Significant studies: 7/08>> CT head: No acute intracranial pathology. 7/08>> CT C-spine: No fracture/subluxation 7/08>> CT abdomen/pelvis: Lytic appearance of superior endplate L1-gas within the disc space concerning for discitis/osteomyelitis.  Previously seen pancreatic head lesion appears resolved. 7/09>> MRI brain: Please a few small acute infarcts in the left frontoparietal cortex. 7/09>> MRI C-spine: No osteomyelitis/discitis. 7/09>> MRI thoracic/lumbar spine: Osteomyelitis/discitis at T12-L1, small epidural collection at T12 with ventral epidural extension of the T9.  4.4 paraspinous fluid collection at T11, T12 suspicious for abscess. 7/09>> Echo: EF 58-83%, grade 1 diastolic dysfunction. 7/11>> LTM EEG: No seizures. 7/13>> Spot EEG: No seizures. 7/19>> renal ultrasound: No hydronephrosis.  Significant microbiology data: 7/8>> COVID/influenza PCR: Negative 7/8>> blood  culture: Klebsiella pneumoniae  Procedures: 7/8-7/18>> ETT 7/10>> cortrak placed  Consults: PCCM ID Nephrology Neurology Palliative care  Subjective: Awake-remains confused-mumbling.  Per nursing staff-no major issues overnight.  Objective: Vitals: Blood pressure (!) 163/66, pulse 82, temperature 97.9 F (36.6 C), temperature source Axillary, resp. rate 15, height '5\' 5"'$  (1.651 m), weight 70.9 kg, SpO2 98 %.   Exam: Gen Exam: Awake-still confused-NG tube in place. HEENT:atraumatic, normocephalic Chest: B/L clear to auscultation anteriorly CVS:S1S2 regular Abdomen:soft non tender, non distended Extremities:no edema Neurology: Difficult exam but seems to be moving all 4 extremities.  Appears to have significant amount of generalized weakness. Skin: no rash   Pertinent Labs/Radiology:    Latest Ref Rng & Units 08/17/2021    4:01 AM 08/16/2021    4:20 AM 08/15/2021    4:42 AM  CBC  WBC 4.0 - 10.5 K/uL 12.6  13.6  17.8   Hemoglobin 12.0 - 15.0 g/dL 7.7  8.8  8.4   Hematocrit 36.0 - 46.0 % 23.8  27.2  25.2   Platelets 150 - 400 K/uL 137  161  145     Lab Results  Component Value Date   NA 147 (H) 08/17/2021   K 3.7 08/17/2021   CL 115 (H) 08/17/2021   CO2 23 08/17/2021      Assessment/Plan: Septic shock due to Klebsiella pneumonia bacteremia with T12-L1 osteomyelitis epidural abscess/paraspinal collection: Sepsis physiology has resolved-Per neurosurgery-not a candidate for incision/drainage of epidural abscess.  Significantly weak/debilitated but moving all 4 extremities.  On IV Ancef for now-ID recommending lifelong cefadroxil when a bit more stable.    Acute metabolic encephalopathy: Multifactorial due to AKI with significantly elevated BUN, ICU delirium, sepsis/hypoxemia superimposed on dementia.  Neuroimaging/EEG as above.  Plan is to continue supportive care-avoid  sedatives as much as possible-and follow clinically.  Neurology had seen earlier-and has subsequently  signed off.  Acute hypoxic respiratory failure due to compromised airway: Extubated on 7/18-currently on room air or on minimal amounts of oxygen.  Continue supportive care.   AKI on CKD stage IIIb: AKI felt to be hemodynamically mediated-BUN remains significantly elevated-on free water via NG tube-IV albumin-nephrology following-continue to hold all nephrotoxic agents  Anasarca/volume overload: Due to hypoalbuminemia-on IV albumin-getting free water through NG tube.  May need to initiate Lasix at some point in time.    Dysphagia: Due to critical illness-encephalopathy-severe muscular deconditioning-NG tube in place-continue tube feeds.  Do not think she is ready for any sort of oral intake at this point.  Anemia due to critical illness/CKD: No overt bleeding-follow-transfuse if significant drop.  Thrombocytopenia: Due to sepsis-resolved.  Acute CVA: Mostly incidental finding on MRI-Per last neurology note on 7/15-further work-up can be considered if her clinical/lab situation improves.  COPD: Not in exacerbation-continue bronchodilators.  DM-2: CBG stable with SSI.  Recent Labs    08/17/21 0001 08/17/21 0436 08/17/21 0738  GLUCAP 171* 132* 154*     HTN: BP slowly creeping up-continue to use as needed IV hydralazine.  Anxiety/depression: Currenttly encephalopathic-minimize sedatives at this point.  Will need to resume Lexapro when she is a bit more stable.  Dementia: See above regarding encephalopathy.  Severe debility/deconditioning: Obtaining PT/OT eval-likely will need either LTACH or SNF on discharge.    Goals of care/palliative care: Full code-while she was in the ICU-extensive discussion was done by [facility] MD/palliative care with family-who wished to continue full scope of treatment-they are aware of poor overall prognosis.  Nutrition Status: Nutrition Problem: Moderate Malnutrition Etiology: chronic illness Signs/Symptoms: moderate fat depletion, moderate muscle  depletion Interventions: Refer to RD note for recommendations, Tube feeding   Pressure Ulcer: Pressure Injury 08/21/2021 Coccyx Right;Mid Stage 3 -  Full thickness tissue loss. Subcutaneous fat may be visible but bone, tendon or muscle are NOT exposed. Stage 2/3 wound to right sacrum, oval in shape, measuring 1.5cm x 2.5 cm. No tunneling noted. Ful (Active)  08/09/2021 1730  Location: Coccyx  Location Orientation: Right;Mid  Staging: Stage 3 -  Full thickness tissue loss. Subcutaneous fat may be visible but bone, tendon or muscle are NOT exposed.  Wound Description (Comments): Stage 2/3 wound to right sacrum, oval in shape, measuring 1.5cm x 2.5 cm. No tunneling noted. Full thickness tissue loss with possible muscle exposure (difficult to stage). Wound bed yellow, pink, red and dark brown in color. Fissures noted beneath the wound in gluteal cleft.  Present on Admission: Yes  Dressing Type Foam - Lift dressing to assess site every shift;Gauze (Comment);Impregnated gauze (petrolatum) 08/16/21 0847    BMI: Estimated body mass index is 26.01 kg/m as calculated from the following:   Height as of this encounter: '5\' 5"'$  (1.651 m).   Weight as of this encounter: 70.9 kg.   Code status:   Code Status: Full Code   DVT Prophylaxis: heparin injection 5,000 Units Start: 08/12/21 1400 Place and maintain sequential compression device Start: 08/21/2021 1510   Family Communication: Daughter-[name] updated over the phone on 7/21   Disposition Plan: Status is: Inpatient Remains inpatient appropriate because: Septic shock-Klebsiella bacteremia-epidural abscess/osteomyelitis-severe encephalopathy-remains n.p.o.-NG tube in place.  Not yet stable for discharge.   Planned Discharge Destination:Skilled nursing facility versus LTACH.   Diet: Diet Order             Diet NPO time specified  Diet effective now                     Antimicrobial agents: Anti-infectives (From admission,  onward)    Start     Dose/Rate Route Frequency Ordered Stop   08/17/21 1200  cefTRIAXone (ROCEPHIN) 2 g in sodium chloride 0.9 % 100 mL IVPB  Status:  Discontinued        2 g 200 mL/hr over 30 Minutes Intravenous Every 12 hours 08/17/21 1046 08/17/21 1058   08/17/21 1145  ceFAZolin (ANCEF) IVPB 2g/100 mL premix        2 g 200 mL/hr over 30 Minutes Intravenous Every 12 hours 08/17/21 1058     08/16/21 2200  cefadroxil (DURICEF) capsule 500 mg  Status:  Discontinued        500 mg Oral 2 times daily 08/16/21 0917 08/17/21 1046   08/06/21 1000  cefTRIAXone (ROCEPHIN) 2 g in sodium chloride 0.9 % 100 mL IVPB  Status:  Discontinued        2 g 200 mL/hr over 30 Minutes Intravenous Every 12 hours 08/06/21 0757 08/16/21 0917   08/05/21 1400  ceFEPIme (MAXIPIME) 1 g in sodium chloride 0.9 % 100 mL IVPB  Status:  Discontinued        1 g 200 mL/hr over 30 Minutes Intravenous Every 24 hours 08/07/2021 1302 08/05/21 0752   08/05/21 1400  ceFEPIme (MAXIPIME) 2 g in sodium chloride 0.9 % 100 mL IVPB  Status:  Discontinued        2 g 200 mL/hr over 30 Minutes Intravenous Every 24 hours 08/05/21 0752 08/05/21 1048   08/05/21 1200  ceFEPIme (MAXIPIME) 2 g in sodium chloride 0.9 % 100 mL IVPB  Status:  Discontinued        2 g 200 mL/hr over 30 Minutes Intravenous Every 24 hours 08/05/21 1048 08/06/21 0757   08/11/2021 1945  vancomycin (VANCOREADY) IVPB 1250 mg/250 mL        1,250 mg 166.7 mL/hr over 90 Minutes Intravenous  Once 08/23/2021 1851 08/05/21 1045   08/02/2021 1315  vancomycin (VANCOREADY) IVPB 1250 mg/250 mL  Status:  Discontinued        1,250 mg 166.7 mL/hr over 90 Minutes Intravenous  Once 08/11/2021 1302 08/17/2021 1851   08/20/2021 1301  vancomycin variable dose per unstable renal function (pharmacist dosing)  Status:  Discontinued         Does not apply See admin instructions 08/22/2021 1302 08/05/21 1125   07/30/2021 1300  ceFEPIme (MAXIPIME) 2 g in sodium chloride 0.9 % 100 mL IVPB        2 g 200 mL/hr  over 30 Minutes Intravenous  Once 08/17/2021 1252 08/05/21 0255   08/19/2021 1300  metroNIDAZOLE (FLAGYL) IVPB 500 mg  Status:  Discontinued        500 mg 100 mL/hr over 60 Minutes Intravenous  Once 08/03/2021 1252 08/05/21 1125   08/21/2021 1300  vancomycin (VANCOCIN) IVPB 1000 mg/200 mL premix  Status:  Discontinued        1,000 mg 200 mL/hr over 60 Minutes Intravenous  Once 08/06/2021 1252 08/08/2021 1302        MEDICATIONS: Scheduled Meds:  Chlorhexidine Gluconate Cloth  6 each Topical Daily   darbepoetin (ARANESP) injection - NON-DIALYSIS  60 mcg Subcutaneous Q Thu-1800   docusate  100 mg Per Tube BID   free water  400 mL Per Tube Q4H   heparin injection (subcutaneous)  5,000 Units Subcutaneous Q8H  insulin aspart  0-15 Units Subcutaneous Q4H   insulin aspart  2 Units Subcutaneous Q4H   nutrition supplement (JUVEN)  1 packet Per Tube BID BM   mouth rinse  15 mL Mouth Rinse 4 times per day   pantoprazole sodium  40 mg Per Tube BID   polyethylene glycol  17 g Per Tube Daily   sodium chloride flush  10-40 mL Intracatheter Q12H   Continuous Infusions:  sodium chloride 250 mL (08/15/21 0945)   albumin human 25 g (08/17/21 0806)    ceFAZolin (ANCEF) IV     feeding supplement (VITAL AF 1.2 CAL) 1,000 mL (08/16/21 1756)   PRN Meds:.dextrose, fentaNYL (SUBLIMAZE) injection, hydrALAZINE, ipratropium-albuterol, mouth rinse, mouth rinse, sodium chloride flush, white petrolatum   I have personally reviewed following labs and imaging studies  LABORATORY DATA: CBC: Recent Labs  Lab 08/13/21 0505 08/14/21 0510 08/15/21 0442 08/16/21 0420 08/17/21 0401  WBC 15.5* 19.5* 17.8* 13.6* 12.6*  HGB 8.8* 7.8* 8.4* 8.8* 7.7*  HCT 25.9* 23.8* 25.2* 27.2* 23.8*  MCV 87.8 89.5 89.0 91.9 91.5  PLT 80* 113* 145* 161 137*     Basic Metabolic Panel: Recent Labs  Lab 08/12/21 0432 08/12/21 0800 08/13/21 0505 08/14/21 0510 08/15/21 0442 08/16/21 0420 08/17/21 0401  NA  --  149* 146* 145 148*  146* 147*  K  --  4.7 4.0 4.2 3.6 3.8 3.7  CL  --  119* 117* 113* 117* 114* 115*  CO2  --  20* 22 21* '22 22 23  '$ GLUCOSE  --  100* 58* 263* 164* 220* 144*  BUN  --  111* 115* 137* 143* 145* 139*  CREATININE  --  1.44* 1.42* 1.75* 1.98* 1.78* 1.54*  CALCIUM  --  9.1 9.3 9.5 9.4 9.4 9.7  MG  --  2.2 2.2 2.1 2.1 1.9 2.0  PHOS 3.9 3.8 3.4 4.1  --  3.6  --      GFR: Estimated Creatinine Clearance: 30.7 mL/min (A) (by C-G formula based on SCr of 1.54 mg/dL (H)).  Liver Function Tests: Recent Labs  Lab 08/16/21 0420 08/17/21 0401  AST  --  36  ALT  --  52*  ALKPHOS  --  112  BILITOT  --  1.2  PROT  --  5.9*  ALBUMIN 1.7* 2.5*    No results for input(s): "LIPASE", "AMYLASE" in the last 168 hours. No results for input(s): "AMMONIA" in the last 168 hours.  Coagulation Profile: No results for input(s): "INR", "PROTIME" in the last 168 hours.  Cardiac Enzymes: No results for input(s): "CKTOTAL", "CKMB", "CKMBINDEX", "TROPONINI" in the last 168 hours.  BNP (last 3 results) No results for input(s): "PROBNP" in the last 8760 hours.  Lipid Profile: No results for input(s): "CHOL", "HDL", "LDLCALC", "TRIG", "CHOLHDL", "LDLDIRECT" in the last 72 hours.  Thyroid Function Tests: No results for input(s): "TSH", "T4TOTAL", "FREET4", "T3FREE", "THYROIDAB" in the last 72 hours.  Anemia Panel: No results for input(s): "VITAMINB12", "FOLATE", "FERRITIN", "TIBC", "IRON", "RETICCTPCT" in the last 72 hours.  Urine analysis:    Component Value Date/Time   COLORURINE AMBER (A) 08/15/2021 1550   APPEARANCEUR TURBID (A) 08/15/2021 1550   LABSPEC 1.014 08/15/2021 1550   LABSPEC 1.020 10/12/2018 1055   PHURINE 5.0 08/15/2021 1550   GLUCOSEU NEGATIVE 08/15/2021 1550   HGBUR NEGATIVE 08/15/2021 1550   BILIRUBINUR NEGATIVE 08/15/2021 1550   BILIRUBINUR negative 10/12/2018 1055   BILIRUBINUR n 07/01/2016 1139   KETONESUR NEGATIVE 08/15/2021 1550   PROTEINUR  100 (A) 08/15/2021 1550    UROBILINOGEN negative (A) 07/01/2016 1139   NITRITE NEGATIVE 08/15/2021 1550   LEUKOCYTESUR LARGE (A) 08/15/2021 1550    Sepsis Labs: Lactic Acid, Venous    Component Value Date/Time   LATICACIDVEN 1.4 08/09/2021 0401    MICROBIOLOGY: No results found for this or any previous visit (from the past 240 hour(s)).  RADIOLOGY STUDIES/RESULTS: US RENAL  Result Date: 08/15/2021 CLINICAL DATA:  Acute kidney injury. EXAM: RENAL / URINARY TRACT ULTRASOUND COMPLETE COMPARISON:  MRI 06/04/2021 FINDINGS: Right Kidney: Renal measurements: 9.9 x 5.5 x 4.9 cm = volume: 137 mL. Slightly increased echogenicity. No mass, stone or hydronephrosis. Left Kidney: Renal measurements: 10.0 x 5.9 x 5.5 cm = volume: 170 mL. Slight increased echogenicity. No mass, stone or hydronephrosis. Bladder: Some debris like material in the bladder. Other: Small amount of ascites.  Bilateral pleural effusions. IMPRESSION: Normal sized kidneys. No obstruction. Slight increased parenchymal echogenicity consistent with renal parenchymal disease. Bilateral pleural effusions and ascites. Electronically Signed   By: Nelson Chimes M.D.   On: 08/15/2021 16:06     LOS: 13 days   Oren Binet, MD  [facility]    To contact the attending provider between 7A-7P or the covering provider during after hours 7P-7A, please log into the web site [email or web address] and access using universal [place] password for that web site. If you do not have the password, please call the hospital operator.  08/17/2021, 11:47 AM

## 2021-08-17 NOTE — Progress Notes (Signed)
[facility] Progress Note    Assessment/ Plan:   AKI on CKD 4 (nonoliguric): Likely secondary to sepsis & diuresis. Suspecting she is intravascularly dry -underlying CKD likely related to diabetic kidney disease, followed by Dr. Johnney Ou in the office, last Cr in April 2023 was 2.06 w/ eGFR of 25 which seems to be around her baseline -overall, she is not an ideal long term dialysis candidate given underlying dementia and severe illness (epidural abscess). Will give her kidney function a chance to recover w/ med mgmt -FWF inc'ed on 7/19 -Cr continues to improve, BUN slightly better -c/w albumin for today (started yesterday), will redose if needed -Continue to monitor daily Cr, Dose meds for GFR<15 -Maintain MAP>65 for optimal renal perfusion.  -Avoid nephrotoxic medications including NSAIDs and iodinated intravenous contrast exposure unless the latter is absolutely indicated.  Preferred narcotic agents for pain control are hydromorphone, fentanyl, and methadone. Morphine should not be used. Avoid Baclofen and avoid oral sodium phosphate and magnesium citrate based laxatives / bowel preps. Continue strict Input and Output monitoring. Will monitor the patient closely with you and intervene or adjust therapy as indicated by changes in clinical status/labs    Sepsis secondary to urinary source (K. Pneumoniae) w/ spinal OM and epidural abscess, discitis -abx per primary service -not a surgical candidate due to severe thrombocytopenia   AHRF -secondary to compromised airway, extubated 7/18 -high risk for re-intubation   Toxic metabolic encephalopathy -has been seen by neurology. CNS involvement from CNS superimposed on h/o CVA, advanced dementia   Hypertension: -can start amlodipine if needed   Hypernatremia -FWF inc'ed 7/19, na stable   Anemia due to chronic disease: -Transfuse for Hgb<7 g/dL -avoid IV iron in the context of sepsis/bacteremia -started ESA 7/20   Diabetes  Mellitus Type 2 with Hyperglycemia -Per primary service   Thrombocytopenia -likely secondary to sepsis, per primary  Severe protein calorie malnutrition -per primary  Subjective:   No acute events. Uop : only 900cc charted (overnight not charted).    Objective:   BP (!) 163/66 (BP Location: Right Arm)   Pulse 82   Temp 97.9 F (36.6 C) (Axillary)   Resp 15   Ht 5' 5"  (1.651 m)   Wt 70.9 kg   SpO2 98%   BMI 26.01 kg/m   Intake/Output Summary (Last 24 hours) at 08/17/2021 1230 Last data filed at 08/17/2021 3762 Gross per 24 hour  Intake 600 ml  Output 1800 ml  Net -1200 ml   Weight change:   Physical Exam: Gen:ill appearing, frail CVS:RRR Resp:cta bl GBT:DVVO HYW:[id number] Neuro: not following commands, tracking, moaning+grimacing intermittently, moves all ext spontaneously  Imaging: US RENAL  Result Date: 08/15/2021 CLINICAL DATA:  Acute kidney injury. EXAM: RENAL / URINARY TRACT ULTRASOUND COMPLETE COMPARISON:  MRI 06/04/2021 FINDINGS: Right Kidney: Renal measurements: 9.9 x 5.5 x 4.9 cm = volume: 137 mL. Slightly increased echogenicity. No mass, stone or hydronephrosis. Left Kidney: Renal measurements: 10.0 x 5.9 x 5.5 cm = volume: 170 mL. Slight increased echogenicity. No mass, stone or hydronephrosis. Bladder: Some debris like material in the bladder. Other: Small amount of ascites.  Bilateral pleural effusions. IMPRESSION: Normal sized kidneys. No obstruction. Slight increased parenchymal echogenicity consistent with renal parenchymal disease. Bilateral pleural effusions and ascites. Electronically Signed   By: Nelson Chimes M.D.   On: 08/15/2021 16:06    Labs: BMET Recent Labs  Lab 08/11/21 0800 08/11/21 1558 08/12/21 0432 08/12/21 0800 08/13/21 0505 08/14/21 0510 08/15/21 0442 08/16/21 0420 08/17/21  0401  NA 144 145  --  149* 146* 145 148* 146* 147*  K 5.4* 5.9*  --  4.7 4.0 4.2 3.6 3.8 3.7  CL 117* 116*  --  119* 117* 113* 117* 114* 115*  CO2 19*  19*  --  20* 22 21* 22 22 23   GLUCOSE 195* 270*  --  100* 58* 263* 164* 220* 144*  BUN 91* 100*  --  111* 115* 137* 143* 145* 139*  CREATININE 1.45* 1.36*  --  1.44* 1.42* 1.75* 1.98* 1.78* 1.54*  CALCIUM 8.9 8.7*  --  9.1 9.3 9.5 9.4 9.4 9.7  PHOS 4.4 4.3 3.9 3.8 3.4 4.1  --  3.6  --    CBC Recent Labs  Lab 08/14/21 0510 08/15/21 0442 08/16/21 0420 08/17/21 0401  WBC 19.5* 17.8* 13.6* 12.6*  HGB 7.8* 8.4* 8.8* 7.7*  HCT 23.8* 25.2* 27.2* 23.8*  MCV 89.5 89.0 91.9 91.5  PLT 113* 145* 161 137*    Medications:     Chlorhexidine Gluconate Cloth  6 each Topical Daily   darbepoetin (ARANESP) injection - NON-DIALYSIS  60 mcg Subcutaneous Q Thu-1800   docusate  100 mg Per Tube BID   free water  400 mL Per Tube Q4H   heparin injection (subcutaneous)  5,000 Units Subcutaneous Q8H   insulin aspart  0-15 Units Subcutaneous Q4H   insulin aspart  2 Units Subcutaneous Q4H   nutrition supplement (JUVEN)  1 packet Per Tube BID BM   mouth rinse  15 mL Mouth Rinse 4 times per day   pantoprazole sodium  40 mg Per Tube BID   polyethylene glycol  17 g Per Tube Daily   sodium chloride flush  10-40 mL Intracatheter Q12H      Gean Quint, MD [facility] 08/17/2021, 12:30 PM

## 2021-08-18 ENCOUNTER — Other Ambulatory Visit: Payer: Self-pay

## 2021-08-18 DIAGNOSIS — R7881 Bacteremia: Secondary | ICD-10-CM | POA: Diagnosis not present

## 2021-08-18 DIAGNOSIS — A414 Sepsis due to anaerobes: Secondary | ICD-10-CM | POA: Diagnosis not present

## 2021-08-18 DIAGNOSIS — G9340 Encephalopathy, unspecified: Secondary | ICD-10-CM | POA: Diagnosis not present

## 2021-08-18 DIAGNOSIS — B961 Klebsiella pneumoniae [K. pneumoniae] as the cause of diseases classified elsewhere: Secondary | ICD-10-CM

## 2021-08-18 DIAGNOSIS — J9601 Acute respiratory failure with hypoxia: Secondary | ICD-10-CM | POA: Diagnosis not present

## 2021-08-18 LAB — RENAL FUNCTION PANEL
Albumin: 2.7 g/dL — ABNORMAL LOW (ref 3.5–5.0)
Anion gap: 9 (ref 5–15)
BUN: 132 mg/dL — ABNORMAL HIGH (ref 8–23)
CO2: 23 mmol/L (ref 22–32)
Calcium: 9.6 mg/dL (ref 8.9–10.3)
Chloride: 114 mmol/L — ABNORMAL HIGH (ref 98–111)
Creatinine, Ser: 1.5 mg/dL — ABNORMAL HIGH (ref 0.44–1.00)
GFR, Estimated: 36 mL/min — ABNORMAL LOW (ref >60)
Glucose, Bld: 215 mg/dL — ABNORMAL HIGH (ref 70–99)
Phosphorus: 2.1 mg/dL — ABNORMAL LOW (ref 2.5–4.6)
Potassium: 3.7 mmol/L (ref 3.5–5.1)
Sodium: 146 mmol/L — ABNORMAL HIGH (ref 135–145)

## 2021-08-18 LAB — GLUCOSE, CAPILLARY
Glucose-Capillary: 196 mg/dL — ABNORMAL HIGH (ref 70–99)
Glucose-Capillary: 226 mg/dL — ABNORMAL HIGH (ref 70–99)
Glucose-Capillary: 207 mg/dL — ABNORMAL HIGH (ref 70–99)
Glucose-Capillary: 194 mg/dL — ABNORMAL HIGH (ref 70–99)
Glucose-Capillary: 188 mg/dL — ABNORMAL HIGH (ref 70–99)
Glucose-Capillary: 190 mg/dL — ABNORMAL HIGH (ref 70–99)
Glucose-Capillary: 204 mg/dL — ABNORMAL HIGH (ref 70–99)

## 2021-08-18 LAB — CBC
HCT: 21 % — ABNORMAL LOW (ref 36.0–46.0)
Hemoglobin: 7 g/dL — ABNORMAL LOW (ref 12.0–15.0)
MCH: 30.2 pg (ref 26.0–34.0)
MCHC: 33.3 g/dL (ref 30.0–36.0)
MCV: 90.5 fL (ref 80.0–100.0)
Platelets: 130 10*3/uL — ABNORMAL LOW (ref 150–400)
RBC: 2.32 MIL/uL — ABNORMAL LOW (ref 3.87–5.11)
RDW: 19.9 % — ABNORMAL HIGH (ref 11.5–15.5)
WBC: 11.8 10*3/uL — ABNORMAL HIGH (ref 4.0–10.5)
nRBC: 0 % (ref 0.0–0.2)

## 2021-08-18 MED ORDER — CARVEDILOL 6.25 MG PO TABS
6.2500 mg | ORAL_TABLET | Freq: Two times a day (BID) | ORAL | Status: DC
  Administered 2021-08-18 – 2021-08-21 (×6): 6.25 mg
  Filled 2021-08-18 (×6): qty 1

## 2021-08-18 MED ORDER — ALBUMIN HUMAN 25 % IV SOLN
25.0000 g | Freq: Two times a day (BID) | INTRAVENOUS | Status: AC
  Administered 2021-08-18 – 2021-08-19 (×4): 25 g via INTRAVENOUS
  Filled 2021-08-18 (×4): qty 100

## 2021-08-18 NOTE — Progress Notes (Signed)
[facility] Progress Note    Assessment/ Plan:   AKI on CKD 4 (nonoliguric): Likely secondary to sepsis & diuresis. Suspecting she is intravascularly dry -underlying CKD likely related to diabetic kidney disease, followed by Dr. Johnney Ou in the office, last Cr in April 2023 was 2.06 w/ eGFR of 25 which seems to be around her baseline -overall, she is not an ideal long term dialysis candidate given underlying dementia and severe illness (epidural abscess). Will give her kidney function a chance to recover w/ med mgmt -FWF inc'ed on 7/19 -BUN and Cr slowly improving, will redose albumin x 2 days -Continue to monitor daily Cr, Dose meds for GFR<15 -Maintain MAP>65 for optimal renal perfusion.  -Avoid nephrotoxic medications including NSAIDs and iodinated intravenous contrast exposure unless the latter is absolutely indicated.  Preferred narcotic agents for pain control are hydromorphone, fentanyl, and methadone. Morphine should not be used. Avoid Baclofen and avoid oral sodium phosphate and magnesium citrate based laxatives / bowel preps. Continue strict Input and Output monitoring. Will monitor the patient closely with you and intervene or adjust therapy as indicated by changes in clinical status/labs    Sepsis secondary to urinary source (K. Pneumoniae) w/ spinal OM and epidural abscess, discitis -abx per primary service -not a surgical candidate due to severe thrombocytopenia   AHRF -secondary to compromised airway, extubated 7/18 -high risk for re-intubation   Toxic metabolic encephalopathy -has been seen by neurology. CNS involvement from CNS superimposed on h/o CVA, advanced dementia   Hypertension: -can start amlodipine if needed   Hypernatremia -FWF inc'ed 7/19, na stable   Anemia due to chronic disease: -Transfuse for Hgb<7 g/dL -avoid IV iron in the context of sepsis/bacteremia -started ESA 7/20   Diabetes Mellitus Type 2 with Hyperglycemia -Per primary  service   Thrombocytopenia -likely secondary to sepsis, per primary  Severe protein calorie malnutrition -per primary  Subjective:   No acute events. Uop 2.1L   Objective:   BP (!) 164/68 (BP Location: Right Arm)   Pulse 83   Temp 98 F (36.7 C) (Axillary)   Resp 17   Ht _0  (1.651 m)   Wt 77.1 kg   SpO2 98%   BMI 28.29 kg/m   Intake/Output Summary (Last 24 hours) at 08/18/2021 1219 Last data filed at 08/18/2021 0600 Gross per 24 hour  Intake 2713.04 ml  Output 1200 ml  Net 1513.04 ml   Weight change:   Physical Exam: Gen:ill appearing, frail HEENT: dried blood on teeth, dry mucosal membranes CVS:RRR Resp:cta bl XBM:WUXL KGM:[id number] Neuro: not following commands, tracking, moaning+grimacing intermittently, moves all ext spontaneously  Imaging: No results found.  Labs: BMET Recent Labs  Lab 08/11/21 1558 08/12/21 0432 08/12/21 0800 08/13/21 0505 08/14/21 0510 08/15/21 0442 08/16/21 0420 08/17/21 0401 08/18/21 0415  NA 145  --  149* 146* 145 148* 146* 147* 146*  K 5.9*  --  4.7 4.0 4.2 3.6 3.8 3.7 3.7  CL 116*  --  119* 117* 113* 117* 114* 115* 114*  CO2 19*  --  20* 22 21* _1 GLUCOSE 270*  --  100* 58* 263* 164* 220* 144* 215*  BUN 100*  --  111* 115* 137* 143* 145* 139* 132*  CREATININE 1.36*  --  1.44* 1.42* 1.75* 1.98* 1.78* 1.54* 1.50*  CALCIUM 8.7*  --  9.1 9.3 9.5 9.4 9.4 9.7 9.6  PHOS 4.3 3.9 3.8 3.4 4.1  --  3.6  --  2.1*   CBC  Recent Labs  Lab 08/15/21 0442 08/16/21 0420 08/17/21 0401 08/18/21 0415  WBC 17.8* 13.6* 12.6* 11.8*  HGB 8.4* 8.8* 7.7* 7.0*  HCT 25.2* 27.2* 23.8* 21.0*  MCV 89.0 91.9 91.5 90.5  PLT 145* 161 137* 130*    Medications:     Chlorhexidine Gluconate Cloth  6 each Topical Daily   darbepoetin (ARANESP) injection - NON-DIALYSIS  60 mcg Subcutaneous Q Thu-1800   docusate  100 mg Per Tube BID   free water  400 mL Per Tube Q4H   heparin injection (subcutaneous)  5,000 Units Subcutaneous Q8H    insulin aspart  0-15 Units Subcutaneous Q4H   insulin aspart  2 Units Subcutaneous Q4H   nutrition supplement (JUVEN)  1 packet Per Tube BID BM   mouth rinse  15 mL Mouth Rinse 4 times per day   pantoprazole sodium  40 mg Per Tube BID   polyethylene glycol  17 g Per Tube Daily   sodium chloride flush  10-40 mL Intracatheter Q12H      Gean Quint, MD [facility] 08/18/2021, 12:19 PM

## 2021-08-18 NOTE — TOC Progression Note (Signed)
Transition of Care [facility]) - Progression Note    Patient Details  Name: Courtney Goodman MRN: [id number] Date of Birth: 02-27-1945  [facility]) CM/SW [place], RN Phone Number: 08/18/2021, 11:32 AM  Clinical Narrative:     Late note from 08/17/2021 Patient discussed in progression rounds. It was discussed to obtain a consult to see if the patient met LTAC criteria. She has had a ICU stay, has a NG, oxygen.  Received a call from [place] at [place]. He was gathering information and will let us know status. [phone]       Expected Discharge Plan and Services        Possible to LTAC                                         Social Determinants of Health (SDOH) Interventions    Readmission Risk Interventions     No data to display

## 2021-08-18 NOTE — Progress Notes (Addendum)
PROGRESS NOTE        PATIENT DETAILS Name: Courtney Goodman Age: 76 y.o. Sex: female Date of Birth: [birth year] Admit Date: 08/05/2021 Admitting Physician Chesley Mires, MD [id number], Mare Ferrari, PA-C  Brief Summary: Patient is a 76 y.o.  female history of DM-2, HTN, HLD, CVA, anxiety/depression, dementia who was admitted to the ICU with acute hypoxic respiratory failure and septic shock due to Klebsiella bacteremia-found to have epidural abscesses (not a surgical candidate).  Had a prolonged ICU stay complicated by severe encephalopathy, AKI-stabilized and subsequently transferred to [facility] service on 7/20.  See below for further details.   Significant events: 7/08>> admit to ICU-septic shock/respiratory failure.  Intubated. 7/10>> Thoracic and lumbar spine: Osteomyelitis/discitis and epidural abscess.  PCCM discussed with neurosurgery-not a surgical candidate. 7/18>> extubated. 7/20>> transferred to [facility].  Rediscussed case with neurosurgery-Joshua Glenford Peers, NP-not a Surgical candidate.  Significant studies: 7/08>> CT head: No acute intracranial pathology. 7/08>> CT C-spine: No fracture/subluxation 7/08>> CT abdomen/pelvis: Lytic appearance of superior endplate L1-gas within the disc space concerning for discitis/osteomyelitis.  Previously seen pancreatic head lesion appears resolved. 7/09>> MRI brain: Please a few small acute infarcts in the left frontoparietal cortex. 7/09>> MRI C-spine: No osteomyelitis/discitis. 7/09>> MRI thoracic/lumbar spine: Osteomyelitis/discitis at T12-L1, small epidural collection at T12 with ventral epidural extension of the T9.  4.4 paraspinous fluid collection at T11, T12 suspicious for abscess. 7/09>> Echo: EF 61-60%, grade 1 diastolic dysfunction. 7/11>> LTM EEG: No seizures. 7/13>> Spot EEG: No seizures. 7/19>> renal ultrasound: No hydronephrosis.  Significant microbiology data: 7/8>> COVID/influenza PCR: Negative 7/8>> blood  culture: Klebsiella pneumoniae  Procedures: 7/8-7/18>> ETT 7/10>> cortrak placed  Consults: PCCM ID Nephrology Neurology Palliative care  Subjective: Unchanged-confused-mumbles-does not really follow commands.  Daughter [name] at bedside.  Objective: Vitals: Blood pressure (!) 164/68, pulse 83, temperature 98 F (36.7 C), temperature source Axillary, resp. rate 17, height '5\' 5"'$  (1.651 m), weight 77.1 kg, SpO2 98 %.   Exam: Gen Exam: Confused but not in any distress. HEENT:atraumatic, normocephalic Chest: B/L clear to auscultation anteriorly CVS:S1S2 regular Abdomen:soft non tender, non distended Extremities:no edema Neurology: Debilitated-generalized weakness-but withdraws to pain in all 4 extremities. Skin: no rash   Pertinent Labs/Radiology:    Latest Ref Rng & Units 08/18/2021    4:15 AM 08/17/2021    4:01 AM 08/16/2021    4:20 AM  CBC  WBC 4.0 - 10.5 K/uL 11.8  12.6  13.6   Hemoglobin 12.0 - 15.0 g/dL 7.0  7.7  8.8   Hematocrit 36.0 - 46.0 % 21.0  23.8  27.2   Platelets 150 - 400 K/uL 130  137  161     Lab Results  Component Value Date   NA 146 (H) 08/18/2021   K 3.7 08/18/2021   CL 114 (H) 08/18/2021   CO2 23 08/18/2021      Assessment/Plan: Septic shock due to Klebsiella pneumonia bacteremia with T12-L1 osteomyelitis epidural abscess/paraspinal collection: Sepsis physiology has resolved-Per neurosurgery-not a candidate for incision/drainage of epidural abscess.  Significantly weak/debilitated but moving all 4 extremities.  On IV Ancef for now-ID recommending lifelong cefadroxil when a bit more stable.    Acute metabolic encephalopathy: Multifactorial due to AKI with significantly elevated BUN, ICU delirium, sepsis/hypoxemia superimposed on dementia.  Neuroimaging/EEG as above.  Remains very confused for the past several days.  Plan is to continue supportive  care-avoid sedatives as much as possible-and follow clinically.  Neurology had seen earlier-and has  subsequently signed off.  Acute hypoxic respiratory failure due to compromised airway: Extubated on 7/18-currently on room air or on minimal amounts of oxygen.  Continue supportive care.   AKI on CKD stage IIIb: AKI felt to be hemodynamically mediated-BUN remains significantly elevated-on free water via NG tube-IV albumin-nephrology following-continue to hold all nephrotoxic agents  Hypernatremia: Mild-on free water via NG tube.  Follow electrolytes.  Anasarca/volume overload: Due to hypoalbuminemia-on IV albumin-getting free water through NG tube.  May need to initiate Lasix at some point in time.    Dysphagia: Due to critical illness-encephalopathy-severe muscular deconditioning-NG tube in place-continue tube feeds.  Do not think she is ready for any sort of oral intake at this point.  Anemia due to critical illness/CKD: Globin slowly trending down-follow and transfuse if<7.  Thrombocytopenia: Due to sepsis-resolved.  Acute CVA: Mostly incidental finding on MRI-Per last neurology note on 7/15-further work-up can be considered if her clinical/lab situation improves.  COPD: Not in exacerbation-continue bronchodilators.  DM-2: CBG stable with SSI.  Recent Labs    08/18/21 0511 08/18/21 0817 08/18/21 1158  GLUCAP 226* 207* 194*     HTN: BP slowly creeping up-restarting Coreg-continue as needed IV hydralazine.  Anxiety/depression: Currenttly encephalopathic-minimize sedatives at this point.  Will need to resume Lexapro when she is a bit more stable.  Dementia: See above regarding encephalopathy.  Severe debility/deconditioning: Obtaining PT/OT eval-likely will need either LTACH or SNF on discharge.    Goals of care/palliative care: Full code-while she was in the ICU-extensive discussion was done by [facility] MD/palliative care with family-who wished to continue full scope of treatment-they are aware of poor overall prognosis.  This MD had a conversation with daughter [name] on 7/22-made  aware of poor long-term prognosis.  Nutrition Status: Nutrition Problem: Moderate Malnutrition Etiology: chronic illness Signs/Symptoms: moderate fat depletion, moderate muscle depletion Interventions: Refer to RD note for recommendations, Tube feeding   Pressure Ulcer: Pressure Injury 08/12/2021 Coccyx Right;Mid Stage 3 -  Full thickness tissue loss. Subcutaneous fat may be visible but bone, tendon or muscle are NOT exposed. Stage 2/3 wound to right sacrum, oval in shape, measuring 1.5cm x 2.5 cm. No tunneling noted. Ful (Active)  08/07/2021 1730  Location: Coccyx  Location Orientation: Right;Mid  Staging: Stage 3 -  Full thickness tissue loss. Subcutaneous fat may be visible but bone, tendon or muscle are NOT exposed.  Wound Description (Comments): Stage 2/3 wound to right sacrum, oval in shape, measuring 1.5cm x 2.5 cm. No tunneling noted. Full thickness tissue loss with possible muscle exposure (difficult to stage). Wound bed yellow, pink, red and dark brown in color. Fissures noted beneath the wound in gluteal cleft.  Present on Admission: Yes  Dressing Type Foam - Lift dressing to assess site every shift 08/18/21 0800    BMI: Estimated body mass index is 28.29 kg/m as calculated from the following:   Height as of this encounter: '5\' 5"'$  (1.651 m).   Weight as of this encounter: 77.1 kg.   Code status:   Code Status: Full Code   DVT Prophylaxis: heparin injection 5,000 Units Start: 08/12/21 1400 Place and maintain sequential compression device Start: 08/21/2021 1510   Family Communication: Daughter-[name] updated at bedside on 7/22.   Disposition Plan: Status is: Inpatient Remains inpatient appropriate because: Septic shock-Klebsiella bacteremia-epidural abscess/osteomyelitis-severe encephalopathy-remains n.p.o.-NG tube in place.  Not yet stable for discharge.   Planned Discharge Destination:Skilled nursing facility  versus LTACH.   Diet: Diet Order              Diet NPO time specified  Diet effective now                     Antimicrobial agents: Anti-infectives (From admission, onward)    Start     Dose/Rate Route Frequency Ordered Stop   08/17/21 1200  cefTRIAXone (ROCEPHIN) 2 g in sodium chloride 0.9 % 100 mL IVPB  Status:  Discontinued        2 g 200 mL/hr over 30 Minutes Intravenous Every 12 hours 08/17/21 1046 08/17/21 1058   08/17/21 1145  ceFAZolin (ANCEF) IVPB 2g/100 mL premix        2 g 200 mL/hr over 30 Minutes Intravenous Every 12 hours 08/17/21 1058     08/16/21 2200  cefadroxil (DURICEF) capsule 500 mg  Status:  Discontinued        500 mg Oral 2 times daily 08/16/21 0917 08/17/21 1046   08/06/21 1000  cefTRIAXone (ROCEPHIN) 2 g in sodium chloride 0.9 % 100 mL IVPB  Status:  Discontinued        2 g 200 mL/hr over 30 Minutes Intravenous Every 12 hours 08/06/21 0757 08/16/21 0917   08/05/21 1400  ceFEPIme (MAXIPIME) 1 g in sodium chloride 0.9 % 100 mL IVPB  Status:  Discontinued        1 g 200 mL/hr over 30 Minutes Intravenous Every 24 hours 07/29/2021 1302 08/05/21 0752   08/05/21 1400  ceFEPIme (MAXIPIME) 2 g in sodium chloride 0.9 % 100 mL IVPB  Status:  Discontinued        2 g 200 mL/hr over 30 Minutes Intravenous Every 24 hours 08/05/21 0752 08/05/21 1048   08/05/21 1200  ceFEPIme (MAXIPIME) 2 g in sodium chloride 0.9 % 100 mL IVPB  Status:  Discontinued        2 g 200 mL/hr over 30 Minutes Intravenous Every 24 hours 08/05/21 1048 08/06/21 0757   08/05/2021 1945  vancomycin (VANCOREADY) IVPB 1250 mg/250 mL        1,250 mg 166.7 mL/hr over 90 Minutes Intravenous  Once 08/05/2021 1851 08/05/21 1045   08/08/2021 1315  vancomycin (VANCOREADY) IVPB 1250 mg/250 mL  Status:  Discontinued        1,250 mg 166.7 mL/hr over 90 Minutes Intravenous  Once 08/17/2021 1302 08/14/2021 1851   08/19/2021 1301  vancomycin variable dose per unstable renal function (pharmacist dosing)  Status:  Discontinued         Does not apply See admin instructions  08/15/2021 1302 08/05/21 1125   08/15/2021 1300  ceFEPIme (MAXIPIME) 2 g in sodium chloride 0.9 % 100 mL IVPB        2 g 200 mL/hr over 30 Minutes Intravenous  Once 08/24/2021 1252 08/05/21 0255   08/15/2021 1300  metroNIDAZOLE (FLAGYL) IVPB 500 mg  Status:  Discontinued        500 mg 100 mL/hr over 60 Minutes Intravenous  Once 08/21/2021 1252 08/05/21 1125   08/18/2021 1300  vancomycin (VANCOCIN) IVPB 1000 mg/200 mL premix  Status:  Discontinued        1,000 mg 200 mL/hr over 60 Minutes Intravenous  Once 08/19/2021 1252 07/29/2021 1302        MEDICATIONS: Scheduled Meds:  Chlorhexidine Gluconate Cloth  6 each Topical Daily   darbepoetin (ARANESP) injection - NON-DIALYSIS  60 mcg Subcutaneous Q Thu-1800   docusate  100 mg Per  Tube BID   free water  400 mL Per Tube Q4H   heparin injection (subcutaneous)  5,000 Units Subcutaneous Q8H   insulin aspart  0-15 Units Subcutaneous Q4H   insulin aspart  2 Units Subcutaneous Q4H   nutrition supplement (JUVEN)  1 packet Per Tube BID BM   mouth rinse  15 mL Mouth Rinse 4 times per day   pantoprazole sodium  40 mg Per Tube BID   polyethylene glycol  17 g Per Tube Daily   sodium chloride flush  10-40 mL Intracatheter Q12H   Continuous Infusions:  sodium chloride 250 mL (08/15/21 0945)   albumin human      ceFAZolin (ANCEF) IV 2 g (08/18/21 0944)   feeding supplement (VITAL AF 1.2 CAL) 60 mL/hr at 08/18/21 0600   PRN Meds:.dextrose, fentaNYL (SUBLIMAZE) injection, hydrALAZINE, ipratropium-albuterol, mouth rinse, mouth rinse, sodium chloride flush, white petrolatum   I have personally reviewed following labs and imaging studies  LABORATORY DATA: CBC: Recent Labs  Lab 08/14/21 0510 08/15/21 0442 08/16/21 0420 08/17/21 0401 08/18/21 0415  WBC 19.5* 17.8* 13.6* 12.6* 11.8*  HGB 7.8* 8.4* 8.8* 7.7* 7.0*  HCT 23.8* 25.2* 27.2* 23.8* 21.0*  MCV 89.5 89.0 91.9 91.5 90.5  PLT 113* 145* 161 137* 130*     Basic Metabolic Panel: Recent Labs  Lab  08/12/21 0800 08/13/21 0505 08/14/21 0510 08/15/21 0442 08/16/21 0420 08/17/21 0401 08/18/21 0415  NA 149* 146* 145 148* 146* 147* 146*  K 4.7 4.0 4.2 3.6 3.8 3.7 3.7  CL 119* 117* 113* 117* 114* 115* 114*  CO2 20* 22 21* '22 22 23 23  '$ GLUCOSE 100* 58* 263* 164* 220* 144* 215*  BUN 111* 115* 137* 143* 145* 139* 132*  CREATININE 1.44* 1.42* 1.75* 1.98* 1.78* 1.54* 1.50*  CALCIUM 9.1 9.3 9.5 9.4 9.4 9.7 9.6  MG 2.2 2.2 2.1 2.1 1.9 2.0  --   PHOS 3.8 3.4 4.1  --  3.6  --  2.1*     GFR: Estimated Creatinine Clearance: 32.7 mL/min (A) (by C-G formula based on SCr of 1.5 mg/dL (H)).  Liver Function Tests: Recent Labs  Lab 08/16/21 0420 08/17/21 0401 08/18/21 0415  AST  --  36  --   ALT  --  52*  --   ALKPHOS  --  112  --   BILITOT  --  1.2  --   PROT  --  5.9*  --   ALBUMIN 1.7* 2.5* 2.7*    No results for input(s): "LIPASE", "AMYLASE" in the last 168 hours. No results for input(s): "AMMONIA" in the last 168 hours.  Coagulation Profile: No results for input(s): "INR", "PROTIME" in the last 168 hours.  Cardiac Enzymes: No results for input(s): "CKTOTAL", "CKMB", "CKMBINDEX", "TROPONINI" in the last 168 hours.  BNP (last 3 results) No results for input(s): "PROBNP" in the last 8760 hours.  Lipid Profile: No results for input(s): "CHOL", "HDL", "LDLCALC", "TRIG", "CHOLHDL", "LDLDIRECT" in the last 72 hours.  Thyroid Function Tests: No results for input(s): "TSH", "T4TOTAL", "FREET4", "T3FREE", "THYROIDAB" in the last 72 hours.  Anemia Panel: No results for input(s): "VITAMINB12", "FOLATE", "FERRITIN", "TIBC", "IRON", "RETICCTPCT" in the last 72 hours.  Urine analysis:    Component Value Date/Time   COLORURINE AMBER (A) 08/15/2021 1550   APPEARANCEUR TURBID (A) 08/15/2021 1550   LABSPEC 1.014 08/15/2021 1550   LABSPEC 1.020 10/12/2018 1055   PHURINE 5.0 08/15/2021 1550   GLUCOSEU NEGATIVE 08/15/2021 1550   HGBUR NEGATIVE 08/15/2021 1550  BILIRUBINUR NEGATIVE  08/15/2021 1550   BILIRUBINUR negative 10/12/2018 1055   BILIRUBINUR n 07/01/2016 1139   KETONESUR NEGATIVE 08/15/2021 1550   PROTEINUR 100 (A) 08/15/2021 1550   UROBILINOGEN negative (A) 07/01/2016 1139   NITRITE NEGATIVE 08/15/2021 1550   LEUKOCYTESUR LARGE (A) 08/15/2021 1550    Sepsis Labs: Lactic Acid, Venous    Component Value Date/Time   LATICACIDVEN 1.4 08/09/2021 0401    MICROBIOLOGY: No results found for this or any previous visit (from the past 240 hour(s)).  RADIOLOGY STUDIES/RESULTS: No results found.   LOS: 14 days   Oren Binet, MD  [facility]    To contact the attending provider between 7A-7P or the covering provider during after hours 7P-7A, please log into the web site [email or web address] and access using universal [place] password for that web site. If you do not have the password, please call the hospital operator.  08/18/2021, 1:15 PM

## 2021-08-18 NOTE — Plan of Care (Signed)
  Problem: Education: Goal: Ability to describe self-care measures that may prevent or decrease complications (Diabetes Survival Skills Education) will improve Outcome: Progressing Goal: Individualized Educational Video(s) Outcome: Progressing   

## 2021-08-19 DIAGNOSIS — G9340 Encephalopathy, unspecified: Secondary | ICD-10-CM | POA: Diagnosis not present

## 2021-08-19 DIAGNOSIS — A414 Sepsis due to anaerobes: Secondary | ICD-10-CM | POA: Diagnosis not present

## 2021-08-19 DIAGNOSIS — R7881 Bacteremia: Secondary | ICD-10-CM | POA: Diagnosis not present

## 2021-08-19 DIAGNOSIS — J9601 Acute respiratory failure with hypoxia: Secondary | ICD-10-CM | POA: Diagnosis not present

## 2021-08-19 LAB — PREPARE RBC (CROSSMATCH)

## 2021-08-19 LAB — RENAL FUNCTION PANEL
Albumin: 2.9 g/dL — ABNORMAL LOW (ref 3.5–5.0)
Anion gap: 10 (ref 5–15)
BUN: 133 mg/dL — ABNORMAL HIGH (ref 8–23)
CO2: 23 mmol/L (ref 22–32)
Calcium: 9.5 mg/dL (ref 8.9–10.3)
Chloride: 108 mmol/L (ref 98–111)
Creatinine, Ser: 1.67 mg/dL — ABNORMAL HIGH (ref 0.44–1.00)
GFR, Estimated: 32 mL/min — ABNORMAL LOW (ref >60)
Glucose, Bld: 175 mg/dL — ABNORMAL HIGH (ref 70–99)
Phosphorus: 2.1 mg/dL — ABNORMAL LOW (ref 2.5–4.6)
Potassium: 3.5 mmol/L (ref 3.5–5.1)
Sodium: 141 mmol/L (ref 135–145)

## 2021-08-19 LAB — GLUCOSE, CAPILLARY
Glucose-Capillary: 143 mg/dL — ABNORMAL HIGH (ref 70–99)
Glucose-Capillary: 135 mg/dL — ABNORMAL HIGH (ref 70–99)
Glucose-Capillary: 145 mg/dL — ABNORMAL HIGH (ref 70–99)
Glucose-Capillary: 182 mg/dL — ABNORMAL HIGH (ref 70–99)
Glucose-Capillary: 151 mg/dL — ABNORMAL HIGH (ref 70–99)

## 2021-08-19 LAB — CBC
HCT: 17.9 % — ABNORMAL LOW (ref 36.0–46.0)
Hemoglobin: 5.9 g/dL — CL (ref 12.0–15.0)
MCH: 30.1 pg (ref 26.0–34.0)
MCHC: 33 g/dL (ref 30.0–36.0)
MCV: 91.3 fL (ref 80.0–100.0)
Platelets: 123 10*3/uL — ABNORMAL LOW (ref 150–400)
RBC: 1.96 MIL/uL — ABNORMAL LOW (ref 3.87–5.11)
RDW: 20.9 % — ABNORMAL HIGH (ref 11.5–15.5)
WBC: 9.3 10*3/uL (ref 4.0–10.5)
nRBC: 0 % (ref 0.0–0.2)

## 2021-08-19 MED ORDER — HYDROMORPHONE HCL 1 MG/ML IJ SOLN
0.5000 mg | INTRAMUSCULAR | Status: DC | PRN
  Administered 2021-08-19 – 2021-08-21 (×5): 0.5 mg via INTRAVENOUS
  Filled 2021-08-19 (×5): qty 0.5

## 2021-08-19 MED ORDER — SODIUM CHLORIDE 0.9% IV SOLUTION: Freq: Once | INTRAVENOUS | Status: AC

## 2021-08-19 MED ORDER — ACETAMINOPHEN 325 MG PO TABS
650.0000 mg | ORAL_TABLET | Freq: Four times a day (QID) | ORAL | Status: DC | PRN
Start: 2021-08-19 — End: 2021-08-24
  Administered 2021-08-19: 650 mg
  Filled 2021-08-19: qty 2

## 2021-08-19 NOTE — Progress Notes (Signed)
PROGRESS NOTE        PATIENT DETAILS Name: Courtney Goodman Age: 76 y.o. Sex: female Date of Birth: Dec 27, 1945 Admit Date: 08/09/2021 Admitting Physician Chesley Mires, MD EHM:[id number], Mare Ferrari, PA-C  Brief Summary: Patient is a 76 y.o.  female history of DM-2, HTN, HLD, CVA, anxiety/depression, dementia who was admitted to the ICU with acute hypoxic respiratory failure and septic shock due to Klebsiella bacteremia-found to have epidural abscesses (not a surgical candidate).  Had a prolonged ICU stay complicated by severe encephalopathy, AKI-stabilized and subsequently transferred to [facility] service on 7/20.  See below for further details.   Significant events: 7/08>> admit to ICU-septic shock/respiratory failure.  Intubated. 7/10>> Thoracic and lumbar spine: Osteomyelitis/discitis and epidural abscess.  PCCM discussed with neurosurgery-not a surgical candidate. 7/18>> extubated. 7/20>> transferred to [facility].  Rediscussed case with neurosurgery-Joshua Glenford Peers, NP-not a Surgical candidate.  Significant studies: 7/08>> CT head: No acute intracranial pathology. 7/08>> CT C-spine: No fracture/subluxation 7/08>> CT abdomen/pelvis: Lytic appearance of superior endplate L1-gas within the disc space concerning for discitis/osteomyelitis.  Previously seen pancreatic head lesion appears resolved. 7/09>> MRI brain: Please a few small acute infarcts in the left frontoparietal cortex. 7/09>> MRI C-spine: No osteomyelitis/discitis. 7/09>> MRI thoracic/lumbar spine: Osteomyelitis/discitis at T12-L1, small epidural collection at T12 with ventral epidural extension of the T9.  4.4 paraspinous fluid collection at T11, T12 suspicious for abscess. 7/09>> Echo: EF 83-66%, grade 1 diastolic dysfunction. 7/11>> LTM EEG: No seizures. 7/13>> Spot EEG: No seizures. 7/19>> renal ultrasound: No hydronephrosis.  Significant microbiology data: 7/8>> COVID/influenza PCR: Negative 7/8>> blood  culture: Klebsiella pneumoniae  Procedures: 7/8-7/18>> ETT 7/10>> cortrak placed  Consults: PCCM ID Nephrology Neurology Palliative care  Subjective: Remains unchanged-still starts moaning/groaning the moment I start examining her.  No major issues overnight.  Objective: Vitals: Blood pressure (!) 152/78, pulse 70, temperature (!) 97.4 F (36.3 C), temperature source Axillary, resp. rate 17, height '5\' 5"'$  (1.651 m), weight 77.6 kg, SpO2 98 %.   Exam: Gen Exam: Confused-moaning/groaning the moment I started examining her.  Not following commands. HEENT:atraumatic, normocephalic Chest: B/L clear to auscultation anteriorly CVS:S1S2 regular Abdomen:soft non tender, non distended Extremities:no edema Neurology: Generalized weakness-but seems to be moving all 4 extremities//withdrawing to pain. Skin: no rash    Pertinent Labs/Radiology:    Latest Ref Rng & Units 08/19/2021    3:12 AM 08/18/2021    4:15 AM 08/17/2021    4:01 AM  CBC  WBC 4.0 - 10.5 K/uL 9.3  11.8  12.6   Hemoglobin 12.0 - 15.0 g/dL 5.9  7.0  7.7   Hematocrit 36.0 - 46.0 % 17.9  21.0  23.8   Platelets 150 - 400 K/uL 123  130  137     Lab Results  Component Value Date   NA 141 08/19/2021   K 3.5 08/19/2021   CL 108 08/19/2021   CO2 23 08/19/2021     Assessment/Plan: Septic shock due to Klebsiella pneumonia bacteremia with T12-L1 osteomyelitis epidural abscess/paraspinal collection: Sepsis physiology has resolved-Per neurosurgery-not a candidate for incision/drainage of epidural abscess.  Significantly weak/debilitated but moving all 4 extremities.  On IV Ancef for now-ID recommending lifelong cefadroxil when a bit more stable.    Acute metabolic encephalopathy: Multifactorial due to AKI with significantly elevated BUN, ICU delirium, sepsis/hypoxemia superimposed on dementia.  Neuroimaging/EEG as above.  Confused-unchanged for the  past several days.  Continue supportive care-neurology had seen earlier and has  subsequently signed off.  BUN remains significantly elevated.    Acute hypoxic respiratory failure due to compromised airway: Extubated on 7/18-currently on room air or on minimal amounts of oxygen.  Continue supportive care.   AKI on CKD stage IIIb: AKI felt to be hemodynamically mediated-unfortunately-BUN remains significantly elevated-still on free water via NG tube-on IV albumin.  Per nephrology-poor candidate for HD.  Recommendations from nephrology are to continue with supportive care.  Hypernatremia: Resolved with free water via NG tube.  Anasarca/volume overload: Due to hypoalbuminemia-on IV albumin-getting free water through NG tube.  May need to initiate Lasix at some point in time.    Dysphagia: Due to critical illness-encephalopathy-severe muscular deconditioning-NG tube in place-continue tube feeds.  Do not think she is ready for any sort of oral intake at this point.  Anemia due to critical illness/CKD: Significant drop in hemoglobin today-plan to transfuse PRBC-recheck CBC tomorrow.  No overt bleeding apparent.  Thrombocytopenia: Due to sepsis-resolved.  Acute CVA: Mostly incidental finding on MRI-Per last neurology note on 7/15-further work-up can be considered if her clinical/lab situation improves.  COPD: Not in exacerbation-continue bronchodilators.  DM-2: CBG stable with SSI.  Recent Labs    08/19/21 0346 08/19/21 0815 08/19/21 1209  GLUCAP 143* 135* 145*     HTN: BP slowly creeping up-restarting Coreg-continue as needed IV hydralazine.  Anxiety/depression: Currenttly encephalopathic-minimize sedatives at this point.  Will need to resume Lexapro when she is a bit more stable.  Dementia: See above regarding encephalopathy.  Severe debility/deconditioning: Obtaining PT/OT eval-likely will need either LTACH or SNF on discharge.    Goals of care/palliative care: Full code-while she was in the ICU-extensive discussion was done by [facility] MD/palliative care with  family-who wished to continue full scope of treatment-they are aware of poor overall prognosis.  This MD had a conversation with daughter [name] on 7/22-made aware of poor long-term prognosis.  Nutrition Status: Nutrition Problem: Moderate Malnutrition Etiology: chronic illness Signs/Symptoms: moderate fat depletion, moderate muscle depletion Interventions: Refer to RD note for recommendations, Tube feeding   Pressure Ulcer: Pressure Injury 08/18/2021 Coccyx Right;Mid Stage 3 -  Full thickness tissue loss. Subcutaneous fat may be visible but bone, tendon or muscle are NOT exposed. Stage 2/3 wound to right sacrum, oval in shape, measuring 1.5cm x 2.5 cm. No tunneling noted. Ful (Active)  08/22/2021 1730  Location: Coccyx  Location Orientation: Right;Mid  Staging: Stage 3 -  Full thickness tissue loss. Subcutaneous fat may be visible but bone, tendon or muscle are NOT exposed.  Wound Description (Comments): Stage 2/3 wound to right sacrum, oval in shape, measuring 1.5cm x 2.5 cm. No tunneling noted. Full thickness tissue loss with possible muscle exposure (difficult to stage). Wound bed yellow, pink, red and dark brown in color. Fissures noted beneath the wound in gluteal cleft.  Present on Admission: Yes  Dressing Type Foam - Lift dressing to assess site every shift 08/19/21 0730    BMI: Estimated body mass index is 28.47 kg/m as calculated from the following:   Height as of this encounter: '5\' 5"'$  (1.651 m).   Weight as of this encounter: 77.6 kg.   Code status:   Code Status: Full Code   DVT Prophylaxis: heparin injection 5,000 Units Start: 08/12/21 1400 Place and maintain sequential compression device Start: 08/14/2021 1510   Family Communication: Daughter-[name] updated over the phone on 7/23   Disposition Plan: Status is: Inpatient Remains inpatient  appropriate because: Septic shock-Klebsiella bacteremia-epidural abscess/osteomyelitis-severe encephalopathy-remains n.p.o.-NG  tube in place.  Not yet stable for discharge.   Planned Discharge Destination:Skilled nursing facility versus LTACH.   Diet: Diet Order             Diet NPO time specified  Diet effective now                     Antimicrobial agents: Anti-infectives (From admission, onward)    Start     Dose/Rate Route Frequency Ordered Stop   08/17/21 1200  cefTRIAXone (ROCEPHIN) 2 g in sodium chloride 0.9 % 100 mL IVPB  Status:  Discontinued        2 g 200 mL/hr over 30 Minutes Intravenous Every 12 hours 08/17/21 1046 08/17/21 1058   08/17/21 1145  ceFAZolin (ANCEF) IVPB 2g/100 mL premix        2 g 200 mL/hr over 30 Minutes Intravenous Every 12 hours 08/17/21 1058     08/16/21 2200  cefadroxil (DURICEF) capsule 500 mg  Status:  Discontinued        500 mg Oral 2 times daily 08/16/21 0917 08/17/21 1046   08/06/21 1000  cefTRIAXone (ROCEPHIN) 2 g in sodium chloride 0.9 % 100 mL IVPB  Status:  Discontinued        2 g 200 mL/hr over 30 Minutes Intravenous Every 12 hours 08/06/21 0757 08/16/21 0917   08/05/21 1400  ceFEPIme (MAXIPIME) 1 g in sodium chloride 0.9 % 100 mL IVPB  Status:  Discontinued        1 g 200 mL/hr over 30 Minutes Intravenous Every 24 hours 08/21/2021 1302 08/05/21 0752   08/05/21 1400  ceFEPIme (MAXIPIME) 2 g in sodium chloride 0.9 % 100 mL IVPB  Status:  Discontinued        2 g 200 mL/hr over 30 Minutes Intravenous Every 24 hours 08/05/21 0752 08/05/21 1048   08/05/21 1200  ceFEPIme (MAXIPIME) 2 g in sodium chloride 0.9 % 100 mL IVPB  Status:  Discontinued        2 g 200 mL/hr over 30 Minutes Intravenous Every 24 hours 08/05/21 1048 08/06/21 0757   08/19/2021 1945  vancomycin (VANCOREADY) IVPB 1250 mg/250 mL        1,250 mg 166.7 mL/hr over 90 Minutes Intravenous  Once 08/12/2021 1851 08/05/21 1045   08/23/2021 1315  vancomycin (VANCOREADY) IVPB 1250 mg/250 mL  Status:  Discontinued        1,250 mg 166.7 mL/hr over 90 Minutes Intravenous  Once 08/07/2021 1302 08/19/2021 1851    08/16/2021 1301  vancomycin variable dose per unstable renal function (pharmacist dosing)  Status:  Discontinued         Does not apply See admin instructions 07/31/2021 1302 08/05/21 1125   08/10/2021 1300  ceFEPIme (MAXIPIME) 2 g in sodium chloride 0.9 % 100 mL IVPB        2 g 200 mL/hr over 30 Minutes Intravenous  Once 08/19/2021 1252 08/05/21 0255   08/23/2021 1300  metroNIDAZOLE (FLAGYL) IVPB 500 mg  Status:  Discontinued        500 mg 100 mL/hr over 60 Minutes Intravenous  Once 08/26/2021 1252 08/05/21 1125   08/19/2021 1300  vancomycin (VANCOCIN) IVPB 1000 mg/200 mL premix  Status:  Discontinued        1,000 mg 200 mL/hr over 60 Minutes Intravenous  Once 07/30/2021 1252 08/02/2021 1302        MEDICATIONS: Scheduled Meds:  carvedilol  6.25 mg  Per Tube BID WC   Chlorhexidine Gluconate Cloth  6 each Topical Daily   darbepoetin (ARANESP) injection - NON-DIALYSIS  60 mcg Subcutaneous Q Thu-1800   docusate  100 mg Per Tube BID   free water  400 mL Per Tube Q4H   heparin injection (subcutaneous)  5,000 Units Subcutaneous Q8H   insulin aspart  0-15 Units Subcutaneous Q4H   insulin aspart  2 Units Subcutaneous Q4H   nutrition supplement (JUVEN)  1 packet Per Tube BID BM   mouth rinse  15 mL Mouth Rinse 4 times per day   pantoprazole sodium  40 mg Per Tube BID   polyethylene glycol  17 g Per Tube Daily   sodium chloride flush  10-40 mL Intracatheter Q12H   Continuous Infusions:  sodium chloride 250 mL (08/15/21 0945)   albumin human 25 g (08/19/21 0823)    ceFAZolin (ANCEF) IV 2 g (08/19/21 0821)   feeding supplement (VITAL AF 1.2 CAL) 60 mL/hr at 08/18/21 0600   PRN Meds:.acetaminophen, dextrose, fentaNYL (SUBLIMAZE) injection, hydrALAZINE, ipratropium-albuterol, mouth rinse, mouth rinse, sodium chloride flush, white petrolatum   I have personally reviewed following labs and imaging studies  LABORATORY DATA: CBC: Recent Labs  Lab 08/15/21 0442 08/16/21 0420 08/17/21 0401 08/18/21 0415  08/19/21 0312  WBC 17.8* 13.6* 12.6* 11.8* 9.3  HGB 8.4* 8.8* 7.7* 7.0* 5.9*  HCT 25.2* 27.2* 23.8* 21.0* 17.9*  MCV 89.0 91.9 91.5 90.5 91.3  PLT 145* 161 137* 130* 123*     Basic Metabolic Panel: Recent Labs  Lab 08/13/21 0505 08/14/21 0510 08/15/21 0442 08/16/21 0420 08/17/21 0401 08/18/21 0415 08/19/21 1050  NA 146* 145 148* 146* 147* 146* 141  K 4.0 4.2 3.6 3.8 3.7 3.7 3.5  CL 117* 113* 117* 114* 115* 114* 108  CO2 22 21* '22 22 23 23 23  '$ GLUCOSE 58* 263* 164* 220* 144* 215* 175*  BUN 115* 137* 143* 145* 139* 132* 133*  CREATININE 1.42* 1.75* 1.98* 1.78* 1.54* 1.50* 1.67*  CALCIUM 9.3 9.5 9.4 9.4 9.7 9.6 9.5  MG 2.2 2.1 2.1 1.9 2.0  --   --   PHOS 3.4 4.1  --  3.6  --  2.1* 2.1*     GFR: Estimated Creatinine Clearance: 29.5 mL/min (A) (by C-G formula based on SCr of 1.67 mg/dL (H)).  Liver Function Tests: Recent Labs  Lab 08/16/21 0420 08/17/21 0401 08/18/21 0415 08/19/21 1050  AST  --  36  --   --   ALT  --  52*  --   --   ALKPHOS  --  112  --   --   BILITOT  --  1.2  --   --   PROT  --  5.9*  --   --   ALBUMIN 1.7* 2.5* 2.7* 2.9*    No results for input(s): "LIPASE", "AMYLASE" in the last 168 hours. No results for input(s): "AMMONIA" in the last 168 hours.  Coagulation Profile: No results for input(s): "INR", "PROTIME" in the last 168 hours.  Cardiac Enzymes: No results for input(s): "CKTOTAL", "CKMB", "CKMBINDEX", "TROPONINI" in the last 168 hours.  BNP (last 3 results) No results for input(s): "PROBNP" in the last 8760 hours.  Lipid Profile: No results for input(s): "CHOL", "HDL", "LDLCALC", "TRIG", "CHOLHDL", "LDLDIRECT" in the last 72 hours.  Thyroid Function Tests: No results for input(s): "TSH", "T4TOTAL", "FREET4", "T3FREE", "THYROIDAB" in the last 72 hours.  Anemia Panel: No results for input(s): "VITAMINB12", "FOLATE", "FERRITIN", "TIBC", "IRON", "RETICCTPCT" in  the last 72 hours.  Urine analysis:    Component Value Date/Time    COLORURINE AMBER (A) 08/15/2021 1550   APPEARANCEUR TURBID (A) 08/15/2021 1550   LABSPEC 1.014 08/15/2021 1550   LABSPEC 1.020 10/12/2018 1055   PHURINE 5.0 08/15/2021 1550   GLUCOSEU NEGATIVE 08/15/2021 1550   HGBUR NEGATIVE 08/15/2021 1550   BILIRUBINUR NEGATIVE 08/15/2021 1550   BILIRUBINUR negative 10/12/2018 1055   BILIRUBINUR n 07/01/2016 1139   KETONESUR NEGATIVE 08/15/2021 1550   PROTEINUR 100 (A) 08/15/2021 1550   UROBILINOGEN negative (A) 07/01/2016 1139   NITRITE NEGATIVE 08/15/2021 1550   LEUKOCYTESUR LARGE (A) 08/15/2021 1550    Sepsis Labs: Lactic Acid, Venous    Component Value Date/Time   LATICACIDVEN 1.4 08/09/2021 0401    MICROBIOLOGY: No results found for this or any previous visit (from the past 240 hour(s)).  RADIOLOGY STUDIES/RESULTS: No results found.   LOS: 15 days   Oren Binet, MD  [facility]    To contact the attending provider between 7A-7P or the covering provider during after hours 7P-7A, please log into the web site [email or web address] and access using universal [place] password for that web site. If you do not have the password, please call the hospital operator.  08/19/2021, 1:10 PM

## 2021-08-19 NOTE — Progress Notes (Signed)
[facility] Progress Note    Assessment/ Plan:   AKI on CKD 4 (nonoliguric): Likely secondary to sepsis & diuresis. Suspecting she is intravascularly dry. BUN elevated out of proportion to Cr which is likely related to her being intravascularly volume down along with being in a catabolic state -underlying CKD likely related to diabetic kidney disease, followed by Dr. Johnney Ou in the office, last Cr in April 2023 was 2.06 w/ eGFR of 25 which seems to be around her baseline -overall, she is not an ideal long term dialysis candidate given underlying dementia and severe illness (epidural abscess). Will give her kidney function a chance to recover w/ med mgmt. If not, will need to re-address our concerns & GOC with family, apparently daughters want everything done -FWF inc'ed on 7/19 -BUN and Cr slowly improving as of yesterday, will continue with albumin x 2 days. Today's labs pending -Continue to monitor daily Cr, Dose meds for GFR<15 -Maintain MAP>65 for optimal renal perfusion.  -Avoid nephrotoxic medications including NSAIDs and iodinated intravenous contrast exposure unless the latter is absolutely indicated.  Preferred narcotic agents for pain control are hydromorphone, fentanyl, and methadone. Morphine should not be used. Avoid Baclofen and avoid oral sodium phosphate and magnesium citrate based laxatives / bowel preps. Continue strict Input and Output monitoring. Will monitor the patient closely with you and intervene or adjust therapy as indicated by changes in clinical status/labs    Sepsis secondary to urinary source (K. Pneumoniae) w/ spinal OM and epidural abscess, discitis -abx per primary service -not a surgical candidate due to severe thrombocytopenia   AHRF -secondary to compromised airway, extubated 7/18 -high risk for re-intubation  Anasarca -volume status stable today.    Toxic metabolic encephalopathy -has been seen by neurology. CNS involvement from CNS  superimposed on h/o CVA, advanced dementia. Mental status unchanged today   Hypertension: -can start amlodipine if needed   Hypernatremia -FWF inc'ed 7/19, na stable   Anemia due to chronic disease: -Transfuse for Hgb<7 g/dL -avoid IV iron in the context of sepsis/bacteremia -started ESA 7/20 -r/o bleed?---will defer to primary service   Diabetes Mellitus Type 2 with Hyperglycemia -Per primary service   Thrombocytopenia -likely secondary to sepsis, per primary  Severe protein calorie malnutrition -per primary  Subjective:   No acute events. Uop not charted. Hgb 5.9, received 1u prbc. RFP not drawn at the time of my encounter   Objective:   BP 135/63   Pulse 70   Temp 97.6 F (36.4 C) (Axillary)   Resp (!) 21   Ht 5' 5"  (1.651 m)   Wt 77.6 kg   SpO2 97%   BMI 28.47 kg/m  No intake or output data in the 24 hours ending 08/19/21 1147  Weight change: 0.5 kg  Physical Exam: Gen:ill appearing, frail HEENT: dried blood on teeth, dry mucosal membranes CVS:RRR Resp:cta bl SUO:RVIF BPP:[id number] Neuro: not following commands, tracking, moaning+grimacing intermittently  Imaging: No results found.  Labs: BMET Recent Labs  Lab 08/13/21 0505 08/14/21 0510 08/15/21 0442 08/16/21 0420 08/17/21 0401 08/18/21 0415  NA 146* 145 148* 146* 147* 146*  K 4.0 4.2 3.6 3.8 3.7 3.7  CL 117* 113* 117* 114* 115* 114*  CO2 22 21* 22 22 23 23   GLUCOSE 58* 263* 164* 220* 144* 215*  BUN 115* 137* 143* 145* 139* 132*  CREATININE 1.42* 1.75* 1.98* 1.78* 1.54* 1.50*  CALCIUM 9.3 9.5 9.4 9.4 9.7 9.6  PHOS 3.4 4.1  --  3.6  --  2.1*   CBC Recent Labs  Lab 08/16/21 0420 08/17/21 0401 08/18/21 0415 08/19/21 0312  WBC 13.6* 12.6* 11.8* 9.3  HGB 8.8* 7.7* 7.0* 5.9*  HCT 27.2* 23.8* 21.0* 17.9*  MCV 91.9 91.5 90.5 91.3  PLT 161 137* 130* 123*    Medications:     carvedilol  6.25 mg Per Tube BID WC   Chlorhexidine Gluconate Cloth  6 each Topical Daily   darbepoetin  (ARANESP) injection - NON-DIALYSIS  60 mcg Subcutaneous Q Thu-1800   docusate  100 mg Per Tube BID   free water  400 mL Per Tube Q4H   heparin injection (subcutaneous)  5,000 Units Subcutaneous Q8H   insulin aspart  0-15 Units Subcutaneous Q4H   insulin aspart  2 Units Subcutaneous Q4H   nutrition supplement (JUVEN)  1 packet Per Tube BID BM   mouth rinse  15 mL Mouth Rinse 4 times per day   pantoprazole sodium  40 mg Per Tube BID   polyethylene glycol  17 g Per Tube Daily   sodium chloride flush  10-40 mL Intracatheter Q12H      Gean Quint, MD [facility] 08/19/2021, 11:47 AM

## 2021-08-19 NOTE — TOC Progression Note (Signed)
Transition of Care [facility]) - Progression Note    Patient Details  Name: Courtney Goodman MRN: [id number] Date of Birth: [birth year]  Transition of Care [facility]) CM/SW Contact  Carles Collet, RN Phone Number: 08/19/2021, 10:55 AM  Clinical Narrative:   Spoke w Dr ghimire who confirms that he has explained [facility] to daughter [name] who is agreeable to referral. MD states that patient is medically optimized and appropriate for [facility] as soon as available.  Spoke w Jaquelyn Bitter at [facility] [place] [phone] who will initiate insurance auth.          Expected Discharge Plan and Services                                                 Social Determinants of Health (SDOH) Interventions    Readmission Risk Interventions     No data to display

## 2021-08-19 NOTE — Progress Notes (Signed)
Hgb down to 5.9 this am. Plan to transfuse 1 unit RBC.

## 2021-08-20 ENCOUNTER — Other Ambulatory Visit: Payer: Self-pay | Admitting: Pharmacist

## 2021-08-20 DIAGNOSIS — G9340 Encephalopathy, unspecified: Secondary | ICD-10-CM | POA: Diagnosis not present

## 2021-08-20 DIAGNOSIS — A414 Sepsis due to anaerobes: Secondary | ICD-10-CM

## 2021-08-20 DIAGNOSIS — J9601 Acute respiratory failure with hypoxia: Secondary | ICD-10-CM | POA: Diagnosis not present

## 2021-08-20 DIAGNOSIS — R7881 Bacteremia: Secondary | ICD-10-CM | POA: Diagnosis not present

## 2021-08-20 LAB — CBC
HCT: 26 % — ABNORMAL LOW (ref 36.0–46.0)
Hemoglobin: 8.6 g/dL — ABNORMAL LOW (ref 12.0–15.0)
MCH: 29.6 pg (ref 26.0–34.0)
MCHC: 33.1 g/dL (ref 30.0–36.0)
MCV: 89.3 fL (ref 80.0–100.0)
Platelets: 136 10*3/uL — ABNORMAL LOW (ref 150–400)
RBC: 2.91 MIL/uL — ABNORMAL LOW (ref 3.87–5.11)
RDW: 19.7 % — ABNORMAL HIGH (ref 11.5–15.5)
WBC: 9.7 10*3/uL (ref 4.0–10.5)
nRBC: 0 % (ref 0.0–0.2)

## 2021-08-20 LAB — RENAL FUNCTION PANEL
Albumin: 3 g/dL — ABNORMAL LOW (ref 3.5–5.0)
Anion gap: 6 (ref 5–15)
BUN: 124 mg/dL — ABNORMAL HIGH (ref 8–23)
CO2: 24 mmol/L (ref 22–32)
Calcium: 9.4 mg/dL (ref 8.9–10.3)
Chloride: 108 mmol/L (ref 98–111)
Creatinine, Ser: 1.62 mg/dL — ABNORMAL HIGH (ref 0.44–1.00)
GFR, Estimated: 33 mL/min — ABNORMAL LOW (ref >60)
Glucose, Bld: 99 mg/dL (ref 70–99)
Phosphorus: 2.4 mg/dL — ABNORMAL LOW (ref 2.5–4.6)
Potassium: 3.6 mmol/L (ref 3.5–5.1)
Sodium: 138 mmol/L (ref 135–145)

## 2021-08-20 LAB — TYPE AND SCREEN
ABO/RH(D): O POS
Antibody Screen: NEGATIVE
Unit division: 0

## 2021-08-20 LAB — GLUCOSE, CAPILLARY
Glucose-Capillary: 122 mg/dL — ABNORMAL HIGH (ref 70–99)
Glucose-Capillary: 133 mg/dL — ABNORMAL HIGH (ref 70–99)
Glucose-Capillary: 138 mg/dL — ABNORMAL HIGH (ref 70–99)
Glucose-Capillary: 161 mg/dL — ABNORMAL HIGH (ref 70–99)
Glucose-Capillary: 176 mg/dL — ABNORMAL HIGH (ref 70–99)
Glucose-Capillary: 195 mg/dL — ABNORMAL HIGH (ref 70–99)
Glucose-Capillary: 209 mg/dL — ABNORMAL HIGH (ref 70–99)
Glucose-Capillary: 257 mg/dL — ABNORMAL HIGH (ref 70–99)
Glucose-Capillary: 80 mg/dL (ref 70–99)

## 2021-08-20 LAB — BPAM RBC
Blood Product Expiration Date: 202308262359
ISSUE DATE / TIME: 202307231014
Unit Type and Rh: 5100

## 2021-08-20 NOTE — Progress Notes (Signed)
Courtney Goodman Progress Note    Assessment/ Plan:   AKI on CKD 4 (nonoliguric): Likely secondary to sepsis & diuresis. Suspecting she was intravascularly dry. BUN elevated out of proportion to Cr which is likely related to her being intravascularly volume down along with being in a catabolic state -underlying CKD likely related to diabetic kidney disease, followed by Dr. Kruska in the office, last Cr in April 2023 was 2.06 w/ eGFR of 25 which seems to be around her baseline -overall, she is not a long term dialysis candidate given underlying dementia and severe illness (epidural abscess). Will give her kidney function a chance to recover w/ med mgmt. Fortunately this has happened  -FWF inc'ed on 7/19 -BUN and Cr slowly improving over the last 5 days-  is great    Sepsis secondary to urinary source (K. Pneumoniae) w/ spinal OM and epidural abscess, discitis -abx per primary service -not a surgical candidate due to severe thrombocytopenia   AHRF -secondary to compromised airway, extubated 7/18 -high risk for re-intubation  Anasarca -weight has increased but felt to be dry initially -  albumin 3 so some third spacing    Toxic metabolic encephalopathy -has been seen by neurology. CNS involvement from CNS superimposed on h/o CVA, advanced dementia. Mental status unchanged today   Hypertension: -can start amlodipine if needed-   right now on coreg    Hypernatremia -FWF inc'ed 7/19, na stable   Anemia due to chronic disease: -Transfuse for Hgb<7 g/dL-  required transfusion on 7/23 but hgb now back over 8 -avoid IV iron in the context of sepsis/bacteremia -started ESA 7/20 -r/o bleed?---will defer to primary service   Diabetes Mellitus Type 2 with Hyperglycemia -Per primary service   Thrombocytopenia -likely secondary to sepsis, per primary  Severe protein calorie malnutrition -per primary  Subjective:   No acute events. Uop at least 800. BUN and crt improving  daily since 7/19.  Moaning-  no purposeful conversation-  apparently is her baseline    Objective:   BP (!) 175/76 (BP Location: Right Arm)   Pulse 70   Temp (!) 97 F (36.1 C) (Axillary)   Resp 14   Ht 5' 5" (1.651 m)   Wt 81.6 kg   SpO2 98%   BMI 29.94 kg/m   Intake/Output Summary (Last 24 hours) at 08/20/2021 1114 Last data filed at 08/20/2021 1000 Gross per 24 hour  Intake 1065.23 ml  Output 800 ml  Net 265.23 ml    Weight change: 4 kg  Physical Exam: Gen:ill appearing, frail HEENT: dried blood on teeth, dry mucosal membranes CVS:RRR Resp:cta bl Abd:soft Ext:anasarca-trace Neuro: not following commands, tracking, moaning+grimacing intermittently  Imaging: No results found.  Labs: BMET Recent Labs  Lab 08/14/21 0510 08/15/21 0442 08/16/21 0420 08/17/21 0401 08/18/21 0415 08/19/21 1050 08/20/21 0500  NA 145 148* 146* 147* 146* 141 138  K 4.2 3.6 3.8 3.7 3.7 3.5 3.6  CL 113* 117* 114* 115* 114* 108 108  CO2 21* 22 22 23 23 23 24  GLUCOSE 263* 164* 220* 144* 215* 175* 99  BUN 137* 143* 145* 139* 132* 133* 124*  CREATININE 1.75* 1.98* 1.78* 1.54* 1.50* 1.67* 1.62*  CALCIUM 9.5 9.4 9.4 9.7 9.6 9.5 9.4  PHOS 4.1  --  3.6  --  2.1* 2.1* 2.4*   CBC Recent Labs  Lab 08/17/21 0401 08/18/21 0415 08/19/21 0312 08/20/21 0500  WBC 12.6* 11.8* 9.3 9.7  HGB 7.7* 7.0* 5.9* 8.6*  HCT 23.8* 21.0* 17.9*   26.0*  MCV 91.5 90.5 91.3 89.3  PLT 137* 130* 123* 136*    Medications:     carvedilol  6.25 mg Per Tube BID WC   Chlorhexidine Gluconate Cloth  6 each Topical Daily   darbepoetin (ARANESP) injection - NON-DIALYSIS  60 mcg Subcutaneous Q Thu-1800   docusate  100 mg Per Tube BID   free water  400 mL Per Tube Q4H   heparin injection (subcutaneous)  5,000 Units Subcutaneous Q8H   insulin aspart  0-15 Units Subcutaneous Q4H   insulin aspart  2 Units Subcutaneous Q4H   nutrition supplement (JUVEN)  1 packet Per Tube BID BM   mouth rinse  15 mL Mouth Rinse 4  times per day   pantoprazole sodium  40 mg Per Tube BID   polyethylene glycol  17 g Per Tube Daily   sodium chloride flush  10-40 mL Intracatheter Q12H     Lambert Keto A [employer] 08/20/2021, 11:14 AM

## 2021-08-20 NOTE — Progress Notes (Signed)
PROGRESS NOTE        PATIENT DETAILS Name: Courtney Goodman Age: 76 y.o. Sex: female Date of Birth: January 06, 1946 Admit Date: 08/10/2021 Admitting Physician Chesley Mires, MD [id number], Mare Ferrari, PA-C  Brief Summary: Patient is a 76 y.o.  female history of DM-2, HTN, HLD, CVA, anxiety/depression, dementia who was admitted to the ICU with acute hypoxic respiratory failure and septic shock due to Klebsiella bacteremia-found to have epidural abscesses (not a surgical candidate).  Had a prolonged ICU stay complicated by severe encephalopathy, AKI-stabilized and subsequently transferred to [facility] service on 7/20.  See below for further details.   Significant events: 7/08>> admit to ICU-septic shock/respiratory failure.  Intubated. 7/10>> Thoracic and lumbar spine: Osteomyelitis/discitis and epidural abscess.  PCCM discussed with neurosurgery-not a surgical candidate. 7/18>> extubated. 7/20>> transferred to [facility].  Rediscussed case with neurosurgery-Joshua Glenford Peers, NP-not a Surgical candidate.  Significant studies: 7/08>> CT head: No acute intracranial pathology. 7/08>> CT C-spine: No fracture/subluxation 7/08>> CT abdomen/pelvis: Lytic appearance of superior endplate L1-gas within the disc space concerning for discitis/osteomyelitis.  Previously seen pancreatic head lesion appears resolved. 7/09>> MRI brain: Please a few small acute infarcts in the left frontoparietal cortex. 7/09>> MRI C-spine: No osteomyelitis/discitis. 7/09>> MRI thoracic/lumbar spine: Osteomyelitis/discitis at T12-L1, small epidural collection at T12 with ventral epidural extension of the T9.  4.4 paraspinous fluid collection at T11, T12 suspicious for abscess. 7/09>> Echo: EF 84-69%, grade 1 diastolic dysfunction. 7/11>> LTM EEG: No seizures. 7/13>> Spot EEG: No seizures. 7/19>> renal ultrasound: No hydronephrosis.  Significant microbiology data: 7/8>> COVID/influenza PCR: Negative 7/8>> blood  culture: Klebsiella pneumoniae  Procedures: 7/8-7/18>> ETT 7/10>> cortrak placed  Consults: PCCM ID Nephrology Neurology Palliative care  Subjective: Seems to be in some discomfort-has started moaning and/groaning more since yesterday.  Per nursing staff-whenever she is moved-she cries out in pain.  Objective: Vitals: Blood pressure (!) 175/76, pulse 70, temperature (!) 97 F (36.1 C), temperature source Axillary, resp. rate 14, height '5\' 5"'$  (1.651 m), weight 81.6 kg, SpO2 98 %.   Exam: Gen Exam: Not in distress-until she is moved/exam started-then she starts moaning/groaning.  Seems to be in pain. HEENT:atraumatic, normocephalic Chest: B/L clear to auscultation anteriorly CVS:S1S2 regular Abdomen:soft non tender, non distended Extremities:no edema Neurology: Difficult exam-but seems to be withdrawing to all 4 extremities.  Has significant amount of debility/deconditioning. Skin: no rash   Pertinent Labs/Radiology:    Latest Ref Rng & Units 08/20/2021    5:00 AM 08/19/2021    3:12 AM 08/18/2021    4:15 AM  CBC  WBC 4.0 - 10.5 K/uL 9.7  9.3  11.8   Hemoglobin 12.0 - 15.0 g/dL 8.6  5.9  7.0   Hematocrit 36.0 - 46.0 % 26.0  17.9  21.0   Platelets 150 - 400 K/uL 136  123  130     Lab Results  Component Value Date   NA 138 08/20/2021   K 3.6 08/20/2021   CL 108 08/20/2021   CO2 24 08/20/2021     Assessment/Plan: Septic shock due to Klebsiella pneumonia bacteremia with T12-L1 osteomyelitis epidural abscess/paraspinal collection: Sepsis physiology has resolved-Per neurosurgery-not a candidate for incision/drainage of epidural abscess.  Significantly weak/debilitated but moving all 4 extremities.  On IV Ancef for now-ID recommending lifelong cefadroxil when a bit more stable.    Acute metabolic encephalopathy: Multifactorial due to AKI with  significantly elevated BUN, ICU delirium, sepsis/hypoxemia superimposed on dementia.  Neuroimaging/EEG as above.  Confused-unchanged  for the past several days.  BUN remains significantly elevated.  Continue supportive care-neurology had seen earlier and has subsequently signed off.  BUN remains significantly elevated.    Acute hypoxic respiratory failure due to compromised airway: Extubated on 7/18-currently on room air or on minimal amounts of oxygen.  Continue supportive care.   AKI on CKD stage IIIb: AKI felt to be hemodynamically mediated-BUN still significantly elevated although starting to trend somewhat down.  No longer on IV albumin-remains on if free water via NG tube.  Per nephrology-poor candidate for HD.  Recommendations from nephrology are to continue with supportive care.  Hypernatremia: Resolved with free water via NG tube.  Anasarca/volume overload: Due to hypoalbuminemia-on IV albumin-getting free water through NG tube.  May need to initiate Lasix at some point in time.    Dysphagia: Due to critical illness-encephalopathy-severe muscular deconditioning-NG tube in place-continue tube feeds.  Do not think she is ready for any sort of oral intake at this point.  Anemia due to critical illness/CKD: Significant drop in hemoglobin today-plan to transfuse PRBC-recheck CBC tomorrow.  No overt bleeding apparent.  Thrombocytopenia: Due to sepsis-resolved.  Acute CVA: Mostly incidental finding on MRI-Per last neurology note on 7/15-further work-up can be considered if her clinical/lab situation improves.  COPD: Not in exacerbation-continue bronchodilators.  DM-2: CBG stable with SSI.  Recent Labs    08/20/21 0422 08/20/21 0825 08/20/21 1124  GLUCAP 122* 80 138*     HTN: BP fluctuating due to pain-continue Coreg-follow and optimize.  Suspect pain playing a role.    Anxiety/depression: Currenttly encephalopathic-minimize sedatives at this point.  Will need to resume Lexapro when she is a bit more stable.  Dementia: See above regarding encephalopathy.  Severe debility/deconditioning: Obtaining PT/OT  eval-likely will need either LTACH or SNF on discharge.    Goals of care/palliative care: Full code-while she was in the ICU-extensive discussion was done by [facility] MD/palliative care with family-who wished to continue full scope of treatment-they are aware of poor overall prognosis.  This MD had a conversation with daughter [name] on 7/22-made aware of poor long-term prognosis.  Nutrition Status: Nutrition Problem: Moderate Malnutrition Etiology: chronic illness Signs/Symptoms: moderate fat depletion, moderate muscle depletion Interventions: Refer to RD note for recommendations, Tube feeding   Pressure Ulcer: Pressure Injury 08/21/2021 Coccyx Right;Mid Stage 3 -  Full thickness tissue loss. Subcutaneous fat may be visible but bone, tendon or muscle are NOT exposed. Stage 2/3 wound to right sacrum, oval in shape, measuring 1.5cm x 2.5 cm. No tunneling noted. Ful (Active)  08/02/2021 1730  Location: Coccyx  Location Orientation: Right;Mid  Staging: Stage 3 -  Full thickness tissue loss. Subcutaneous fat may be visible but bone, tendon or muscle are NOT exposed.  Wound Description (Comments): Stage 2/3 wound to right sacrum, oval in shape, measuring 1.5cm x 2.5 cm. No tunneling noted. Full thickness tissue loss with possible muscle exposure (difficult to stage). Wound bed yellow, pink, red and dark brown in color. Fissures noted beneath the wound in gluteal cleft.  Present on Admission: Yes  Dressing Type Foam - Lift dressing to assess site every shift 08/19/21 0815    BMI: Estimated body mass index is 29.94 kg/m as calculated from the following:   Height as of this encounter: '5\' 5"'$  (1.651 m).   Weight as of this encounter: 81.6 kg.   Code status:   Code Status: Full Code  DVT Prophylaxis: heparin injection 5,000 Units Start: 08/12/21 1400 Place and maintain sequential compression device Start: 08/13/2021 1510   Family Communication: Daughter-[name]-[phone] updated at bedside on  7/24.   Disposition Plan: Status is: Inpatient Remains inpatient appropriate because: Septic shock-Klebsiella bacteremia-epidural abscess/osteomyelitis-severe encephalopathy-remains n.p.o.-NG tube in place.  Not yet stable for discharge.   Planned Discharge Destination:Skilled nursing facility versus LTACH.   Diet: Diet Order             Diet NPO time specified  Diet effective now                     Antimicrobial agents: Anti-infectives (From admission, onward)    Start     Dose/Rate Route Frequency Ordered Stop   08/17/21 1200  cefTRIAXone (ROCEPHIN) 2 g in sodium chloride 0.9 % 100 mL IVPB  Status:  Discontinued        2 g 200 mL/hr over 30 Minutes Intravenous Every 12 hours 08/17/21 1046 08/17/21 1058   08/17/21 1145  ceFAZolin (ANCEF) IVPB 2g/100 mL premix        2 g 200 mL/hr over 30 Minutes Intravenous Every 12 hours 08/17/21 1058     08/16/21 2200  cefadroxil (DURICEF) capsule 500 mg  Status:  Discontinued        500 mg Oral 2 times daily 08/16/21 0917 08/17/21 1046   08/06/21 1000  cefTRIAXone (ROCEPHIN) 2 g in sodium chloride 0.9 % 100 mL IVPB  Status:  Discontinued        2 g 200 mL/hr over 30 Minutes Intravenous Every 12 hours 08/06/21 0757 08/16/21 0917   08/05/21 1400  ceFEPIme (MAXIPIME) 1 g in sodium chloride 0.9 % 100 mL IVPB  Status:  Discontinued        1 g 200 mL/hr over 30 Minutes Intravenous Every 24 hours 08/16/2021 1302 08/05/21 0752   08/05/21 1400  ceFEPIme (MAXIPIME) 2 g in sodium chloride 0.9 % 100 mL IVPB  Status:  Discontinued        2 g 200 mL/hr over 30 Minutes Intravenous Every 24 hours 08/05/21 0752 08/05/21 1048   08/05/21 1200  ceFEPIme (MAXIPIME) 2 g in sodium chloride 0.9 % 100 mL IVPB  Status:  Discontinued        2 g 200 mL/hr over 30 Minutes Intravenous Every 24 hours 08/05/21 1048 08/06/21 0757   08/05/2021 1945  vancomycin (VANCOREADY) IVPB 1250 mg/250 mL        1,250 mg 166.7 mL/hr over 90 Minutes Intravenous  Once 07/30/2021  1851 08/05/21 1045   08/06/2021 1315  vancomycin (VANCOREADY) IVPB 1250 mg/250 mL  Status:  Discontinued        1,250 mg 166.7 mL/hr over 90 Minutes Intravenous  Once 08/10/2021 1302 08/16/2021 1851   08/17/2021 1301  vancomycin variable dose per unstable renal function (pharmacist dosing)  Status:  Discontinued         Does not apply See admin instructions 08/02/2021 1302 08/05/21 1125   07/30/2021 1300  ceFEPIme (MAXIPIME) 2 g in sodium chloride 0.9 % 100 mL IVPB        2 g 200 mL/hr over 30 Minutes Intravenous  Once 08/20/2021 1252 08/05/21 0255   08/06/2021 1300  metroNIDAZOLE (FLAGYL) IVPB 500 mg  Status:  Discontinued        500 mg 100 mL/hr over 60 Minutes Intravenous  Once 08/20/2021 1252 08/05/21 1125   07/31/2021 1300  vancomycin (VANCOCIN) IVPB 1000 mg/200 mL premix  Status:  Discontinued        1,000 mg 200 mL/hr over 60 Minutes Intravenous  Once 08/23/2021 1252 08/21/2021 1302        MEDICATIONS: Scheduled Meds:  carvedilol  6.25 mg Per Tube BID WC   Chlorhexidine Gluconate Cloth  6 each Topical Daily   darbepoetin (ARANESP) injection - NON-DIALYSIS  60 mcg Subcutaneous Q Thu-1800   docusate  100 mg Per Tube BID   free water  400 mL Per Tube Q4H   heparin injection (subcutaneous)  5,000 Units Subcutaneous Q8H   insulin aspart  0-15 Units Subcutaneous Q4H   insulin aspart  2 Units Subcutaneous Q4H   nutrition supplement (JUVEN)  1 packet Per Tube BID BM   mouth rinse  15 mL Mouth Rinse 4 times per day   pantoprazole sodium  40 mg Per Tube BID   polyethylene glycol  17 g Per Tube Daily   sodium chloride flush  10-40 mL Intracatheter Q12H   Continuous Infusions:  sodium chloride 250 mL (08/15/21 0945)    ceFAZolin (ANCEF) IV 2 g (08/20/21 1119)   feeding supplement (VITAL AF 1.2 CAL) 60 mL/hr at 08/18/21 0600   PRN Meds:.acetaminophen, dextrose, fentaNYL (SUBLIMAZE) injection, hydrALAZINE, HYDROmorphone (DILAUDID) injection, ipratropium-albuterol, mouth rinse, mouth rinse, sodium chloride  flush, white petrolatum   I have personally reviewed following labs and imaging studies  LABORATORY DATA: CBC: Recent Labs  Lab 08/16/21 0420 08/17/21 0401 08/18/21 0415 08/19/21 0312 08/20/21 0500  WBC 13.6* 12.6* 11.8* 9.3 9.7  HGB 8.8* 7.7* 7.0* 5.9* 8.6*  HCT 27.2* 23.8* 21.0* 17.9* 26.0*  MCV 91.9 91.5 90.5 91.3 89.3  PLT 161 137* 130* 123* 136*     Basic Metabolic Panel: Recent Labs  Lab 08/14/21 0510 08/15/21 0442 08/16/21 0420 08/17/21 0401 08/18/21 0415 08/19/21 1050 08/20/21 0500  NA 145 148* 146* 147* 146* 141 138  K 4.2 3.6 3.8 3.7 3.7 3.5 3.6  CL 113* 117* 114* 115* 114* 108 108  CO2 21* '22 22 23 23 23 24  '$ GLUCOSE 263* 164* 220* 144* 215* 175* 99  BUN 137* 143* 145* 139* 132* 133* 124*  CREATININE 1.75* 1.98* 1.78* 1.54* 1.50* 1.67* 1.62*  CALCIUM 9.5 9.4 9.4 9.7 9.6 9.5 9.4  MG 2.1 2.1 1.9 2.0  --   --   --   PHOS 4.1  --  3.6  --  2.1* 2.1* 2.4*     GFR: Estimated Creatinine Clearance: 31.2 mL/min (A) (by C-G formula based on SCr of 1.62 mg/dL (H)).  Liver Function Tests: Recent Labs  Lab 08/16/21 0420 08/17/21 0401 08/18/21 0415 08/19/21 1050 08/20/21 0500  AST  --  36  --   --   --   ALT  --  52*  --   --   --   ALKPHOS  --  112  --   --   --   BILITOT  --  1.2  --   --   --   PROT  --  5.9*  --   --   --   ALBUMIN 1.7* 2.5* 2.7* 2.9* 3.0*    No results for input(s): "LIPASE", "AMYLASE" in the last 168 hours. No results for input(s): "AMMONIA" in the last 168 hours.  Coagulation Profile: No results for input(s): "INR", "PROTIME" in the last 168 hours.  Cardiac Enzymes: No results for input(s): "CKTOTAL", "CKMB", "CKMBINDEX", "TROPONINI" in the last 168 hours.  BNP (last 3 results) No results for input(s): "PROBNP" in the last  8760 hours.  Lipid Profile: No results for input(s): "CHOL", "HDL", "LDLCALC", "TRIG", "CHOLHDL", "LDLDIRECT" in the last 72 hours.  Thyroid Function Tests: No results for input(s): "TSH",  "T4TOTAL", "FREET4", "T3FREE", "THYROIDAB" in the last 72 hours.  Anemia Panel: No results for input(s): "VITAMINB12", "FOLATE", "FERRITIN", "TIBC", "IRON", "RETICCTPCT" in the last 72 hours.  Urine analysis:    Component Value Date/Time   COLORURINE AMBER (A) 08/15/2021 1550   APPEARANCEUR TURBID (A) 08/15/2021 1550   LABSPEC 1.014 08/15/2021 1550   LABSPEC 1.020 10/12/2018 1055   PHURINE 5.0 08/15/2021 1550   GLUCOSEU NEGATIVE 08/15/2021 1550   HGBUR NEGATIVE 08/15/2021 1550   BILIRUBINUR NEGATIVE 08/15/2021 1550   BILIRUBINUR negative 10/12/2018 1055   BILIRUBINUR n 07/01/2016 1139   KETONESUR NEGATIVE 08/15/2021 1550   PROTEINUR 100 (A) 08/15/2021 1550   UROBILINOGEN negative (A) 07/01/2016 1139   NITRITE NEGATIVE 08/15/2021 1550   LEUKOCYTESUR LARGE (A) 08/15/2021 1550    Sepsis Labs: Lactic Acid, Venous    Component Value Date/Time   LATICACIDVEN 1.4 08/09/2021 0401    MICROBIOLOGY: No results found for this or any previous visit (from the past 240 hour(s)).  RADIOLOGY STUDIES/RESULTS: No results found.   LOS: 16 days   Oren Binet, MD  [facility]    To contact the attending provider between 7A-7P or the covering provider during after hours 7P-7A, please log into the web site [email or web address] and access using universal [place] password for that web site. If you do not have the password, please call the hospital operator.  08/20/2021, 11:27 AM

## 2021-08-21 DIAGNOSIS — J9601 Acute respiratory failure with hypoxia: Secondary | ICD-10-CM | POA: Diagnosis not present

## 2021-08-21 DIAGNOSIS — R7881 Bacteremia: Secondary | ICD-10-CM | POA: Diagnosis not present

## 2021-08-21 DIAGNOSIS — G9340 Encephalopathy, unspecified: Secondary | ICD-10-CM | POA: Diagnosis not present

## 2021-08-21 DIAGNOSIS — B961 Klebsiella pneumoniae [K. pneumoniae] as the cause of diseases classified elsewhere: Secondary | ICD-10-CM

## 2021-08-21 DIAGNOSIS — A414 Sepsis due to anaerobes: Secondary | ICD-10-CM | POA: Diagnosis not present

## 2021-08-21 LAB — CBC
HCT: 26.3 % — ABNORMAL LOW (ref 36.0–46.0)
Hemoglobin: 8.6 g/dL — ABNORMAL LOW (ref 12.0–15.0)
MCH: 29.7 pg (ref 26.0–34.0)
MCHC: 32.7 g/dL (ref 30.0–36.0)
MCV: 90.7 fL (ref 80.0–100.0)
Platelets: 141 10*3/uL — ABNORMAL LOW (ref 150–400)
RBC: 2.9 MIL/uL — ABNORMAL LOW (ref 3.87–5.11)
RDW: 20.3 % — ABNORMAL HIGH (ref 11.5–15.5)
WBC: 7.1 10*3/uL (ref 4.0–10.5)
nRBC: 0 % (ref 0.0–0.2)

## 2021-08-21 LAB — RENAL FUNCTION PANEL
Albumin: 2.5 g/dL — ABNORMAL LOW (ref 3.5–5.0)
Anion gap: 8 (ref 5–15)
BUN: 135 mg/dL — ABNORMAL HIGH (ref 8–23)
CO2: 23 mmol/L (ref 22–32)
Calcium: 9.2 mg/dL (ref 8.9–10.3)
Chloride: 104 mmol/L (ref 98–111)
Creatinine, Ser: 1.77 mg/dL — ABNORMAL HIGH (ref 0.44–1.00)
GFR, Estimated: 29 mL/min — ABNORMAL LOW (ref >60)
Glucose, Bld: 267 mg/dL — ABNORMAL HIGH (ref 70–99)
Phosphorus: 2.8 mg/dL (ref 2.5–4.6)
Potassium: 3.9 mmol/L (ref 3.5–5.1)
Sodium: 135 mmol/L (ref 135–145)

## 2021-08-21 LAB — GLUCOSE, CAPILLARY
Glucose-Capillary: 208 mg/dL — ABNORMAL HIGH (ref 70–99)
Glucose-Capillary: 211 mg/dL — ABNORMAL HIGH (ref 70–99)
Glucose-Capillary: 221 mg/dL — ABNORMAL HIGH (ref 70–99)
Glucose-Capillary: 225 mg/dL — ABNORMAL HIGH (ref 70–99)
Glucose-Capillary: 255 mg/dL — ABNORMAL HIGH (ref 70–99)
Glucose-Capillary: 267 mg/dL — ABNORMAL HIGH (ref 70–99)

## 2021-08-21 MED ORDER — HYDROMORPHONE HCL 1 MG/ML IJ SOLN
1.0000 mg | INTRAMUSCULAR | Status: DC | PRN
  Administered 2021-08-21 – 2021-08-24 (×7): 1 mg via INTRAVENOUS
  Filled 2021-08-21 (×7): qty 1

## 2021-08-21 MED ORDER — PROSOURCE TF PO LIQD
45.0000 mL | Freq: Two times a day (BID) | ORAL | Status: DC
  Administered 2021-08-21 – 2021-08-24 (×7): 45 mL
  Filled 2021-08-21 (×7): qty 45

## 2021-08-21 MED ORDER — CARVEDILOL 12.5 MG PO TABS
12.5000 mg | ORAL_TABLET | Freq: Two times a day (BID) | ORAL | Status: DC
Start: 2021-08-21 — End: 2021-08-24
  Administered 2021-08-21 – 2021-08-24 (×6): 12.5 mg
  Filled 2021-08-21 (×6): qty 1

## 2021-08-21 MED ORDER — FREE WATER
400.0000 mL | Freq: Three times a day (TID) | Status: DC
  Administered 2021-08-21 – 2021-08-23 (×5): 400 mL

## 2021-08-21 MED ORDER — OXYCODONE HCL 5 MG/5ML PO SOLN
5.0000 mg | Freq: Four times a day (QID) | ORAL | Status: DC | PRN
  Administered 2021-08-22 – 2021-08-24 (×3): 5 mg
  Filled 2021-08-21 (×3): qty 5

## 2021-08-21 MED ORDER — GLUCERNA 1.5 CAL PO LIQD
1000.0000 mL | ORAL | Status: DC
  Administered 2021-08-21 – 2021-08-23 (×3): 1000 mL
  Filled 2021-08-21 (×4): qty 1000

## 2021-08-21 NOTE — Progress Notes (Signed)
Nutrition Follow-up  DOCUMENTATION CODES:   Non-severe (moderate) malnutrition in context of chronic illness  INTERVENTION:   Tube Feeds via Cortrak: Transition to Glucerna 1.5 at 45 mL/hr (1080 mL/day) 45 mL ProSource TF - BID 130 mL free water flush q4h or per MD Provides 1700 kcal, 111 gm protein, and 1605 mL total free water daily.  Continue 1 packet Juven BID, each packet provides 95 calories, 2.5 grams of protein to support wound healing  NUTRITION DIAGNOSIS:   Moderate Malnutrition related to chronic illness as evidenced by moderate fat depletion, moderate muscle depletion. - Ongoing, being addressed by TF  GOAL:   Patient will meet greater than or equal to 90% of their needs - Met via TF  MONITOR:   Diet advancement, Labs, TF tolerance, Skin, Weight trends  REASON FOR ASSESSMENT:   Consult, Ventilator Enteral/tube feeding initiation and management  ASSESSMENT:   76 yo female admitted with septic shock due to Klebsiella bacteremia from lumbar osteomyelitis and paraspinal abscess from prior UTI, acute respiratory failure and acute toxic encephalopathy requiring intubation. PMH includes DM, HTN, HLD, CAD, CVA, asthma, COPD, CKD 3, dementia  7/10 - Cortrak placed (tip distal stomach) 7/18 - extubated  Discussed case with RN outside of room. RN reports pt is doing well nutrition wise and tolerating TF well. Pt has had increased moaning and groaning today. Discussed plan to switch pt to standard formula.  Pt did not interact with RD during visit.  Medications reviewed and include: Aranesp, Colace, NovoLog, Protonix, Miralax, IV antibiotics  Labs reviewed: BUN 135, Creatinine 1.77, 24 hr CBG 208-267  Diet Order:   Diet Order             Diet NPO time specified  Diet effective now                   EDUCATION NEEDS:   Not appropriate for education at this time  Skin:  Skin Assessment: Skin Integrity Issues: Skin Integrity Issues:: Stage III Stage III:  coccyx  Last BM:  7/24  Height:  Ht Readings from Last 1 Encounters:  08/15/21 _0  (1.651 m)   Weight:  Wt Readings from Last 1 Encounters:  08/21/21 84.8 kg   BMI:  Body mass index is 31.11 kg/m.  Estimated Nutritional Needs:   Kcal:  1600-1800 kcals  Protein:  90-110 g  Fluid:  >/= 1.6 L    Hermina Barters RD, LDN Clinical Dietitian See [facility] for contact information.

## 2021-08-21 NOTE — Progress Notes (Signed)
[facility] Progress Note    Assessment/ Plan:   AKI on CKD 4 (nonoliguric): Likely secondary to sepsis & diuresis. BUN elevated out of proportion to Cr which is likely related to her being intravascularly volume down along with being in a catabolic state -underlying CKD likely related to diabetic kidney disease, followed by Dr. Johnney Ou in the office, last Cr in April 2023 was 2.06 w/ eGFR of 25 which seems to be around her baseline -overall, she is not a  dialysis candidate given underlying dementia and severe illness (epidural abscess). Will give her kidney function a chance to recover w/ med mgmt. Fortunately this has happened  -FWF inc'ed on 7/19 -BUN and Cr had been slowly improving over the last 5 days-  but now a little worse    Sepsis secondary to urinary source (K. Pneumoniae) w/ spinal OM and epidural abscess, discitis -abx per primary service -not a surgical candidate due to severe thrombocytopenia   AHRF -secondary to compromised airway, extubated 7/18 -high risk for re-intubation  Anasarca -weight has increased but felt to be dry initially -  albumin 3 so some third spacing    Toxic metabolic encephalopathy -has been seen by neurology. CNS involvement from CNS superimposed on h/o CVA, advanced dementia. Mental status unchanged today   Hypertension: -can start amlodipine if needed-   right now on coreg    Hypernatremia -FWF inc'ed 7/19, na trending-  now 135 so will dec free water   Anemia due to chronic disease: -Transfuse for Hgb<7 g/dL-  required transfusion on 7/23 but hgb now back over 8 -avoid IV iron in the context of sepsis/bacteremia -started ESA 7/20 -r/o bleed?---will defer to primary service   Diabetes Mellitus Type 2 with Hyperglycemia -Per primary service   Thrombocytopenia -likely secondary to sepsis, per primary  Severe protein calorie malnutrition -per primary  Not really sure what endpoint is.  As above not candidate for  dialysis.  Needing to continue some free water with TF to avoid volume depletion but I have decreased it to 1200 daily based on sodium trending down.  I do not have anything else to offer, will sign off  Subjective:   No acute events. Uop at least 700. BUN and crt slightly worsening.  Moaning-  no purposeful conversation-  apparently is her baseline    Objective:   BP (!) 163/73 (BP Location: Right Arm)   Pulse 69   Temp (!) 97.5 F (36.4 C) (Axillary)   Resp 14   Ht 5' 5"  (1.651 m)   Wt 84.8 kg   SpO2 96%   BMI 31.11 kg/m   Intake/Output Summary (Last 24 hours) at 08/21/2021 1211 Last data filed at 08/21/2021 0745 Gross per 24 hour  Intake 1320 ml  Output 550 ml  Net 770 ml    Weight change: 3.2 kg  Physical Exam: Gen:ill appearing, frail HEENT: dried blood on teeth, dry mucosal membranes CVS:RRR Resp:cta bl EHU:DJSH FWY:[id number] Neuro: not following commands, tracking, moaning+grimacing intermittently  Imaging: No results found.  Labs: BMET Recent Labs  Lab 08/15/21 0442 08/16/21 0420 08/17/21 0401 08/18/21 0415 08/19/21 1050 08/20/21 0500 08/21/21 0333  NA 148* 146* 147* 146* 141 138 135  K 3.6 3.8 3.7 3.7 3.5 3.6 3.9  CL 117* 114* 115* 114* 108 108 104  CO2 22 22 23 23 23 24 23   GLUCOSE 164* 220* 144* 215* 175* 99 267*  BUN 143* 145* 139* 132* 133* 124* 135*  CREATININE 1.98* 1.78* 1.54* 1.50*  1.67* 1.62* 1.77*  CALCIUM 9.4 9.4 9.7 9.6 9.5 9.4 9.2  PHOS  --  3.6  --  2.1* 2.1* 2.4* 2.8   CBC Recent Labs  Lab 08/18/21 0415 08/19/21 0312 08/20/21 0500 08/21/21 0333  WBC 11.8* 9.3 9.7 7.1  HGB 7.0* 5.9* 8.6* 8.6*  HCT 21.0* 17.9* 26.0* 26.3*  MCV 90.5 91.3 89.3 90.7  PLT 130* 123* 136* 141*    Medications:     carvedilol  6.25 mg Per Tube BID WC   Chlorhexidine Gluconate Cloth  6 each Topical Daily   darbepoetin (ARANESP) injection - NON-DIALYSIS  60 mcg Subcutaneous Q Thu-1800   docusate  100 mg Per Tube BID   free water  400 mL  Per Tube Q4H   heparin injection (subcutaneous)  5,000 Units Subcutaneous Q8H   insulin aspart  0-15 Units Subcutaneous Q4H   insulin aspart  2 Units Subcutaneous Q4H   nutrition supplement (JUVEN)  1 packet Per Tube BID BM   mouth rinse  15 mL Mouth Rinse 4 times per day   pantoprazole sodium  40 mg Per Tube BID   polyethylene glycol  17 g Per Tube Daily   sodium chloride flush  10-40 mL Intracatheter Q12H     Lambert Keto A [employer] 08/21/2021, 12:11 PM

## 2021-08-21 NOTE — TOC Progression Note (Signed)
Transition of Care [facility]) - Progression Note    Patient Details  Name: Courtney Goodman MRN: [id number] Date of Birth: [birth year]  [facility] CM/SW Contact  Carles Collet, RN Phone Number: 08/21/2021, 11:06 AM  Clinical Narrative:   Damaris Schooner w Arley Phenix at [facility] who states she submitted for auth this morning. It will take about 48 hours to hear back from insurance. If Josem Kaufmann is denied will need to pursue PEG and SNF placement.           Expected Discharge Plan and Services                                                 Social Determinants of Health (SDOH) Interventions    Readmission Risk Interventions     No data to display

## 2021-08-21 NOTE — Progress Notes (Signed)
PROGRESS NOTE        PATIENT DETAILS Name: Courtney Goodman Age: 76 y.o. Sex: female Date of Birth: 1945/02/08 Admit Date: 08/03/2021 Admitting Physician Chesley Mires, MD [id number], Mare Ferrari, PA-C  Brief Summary: Patient is a 76 y.o.  female history of DM-2, HTN, HLD, CVA, anxiety/depression, dementia who was admitted to the ICU with acute hypoxic respiratory failure and septic shock due to Klebsiella bacteremia-found to have epidural abscesses (not a surgical candidate).  Had a prolonged ICU stay complicated by severe encephalopathy, AKI-stabilized and subsequently transferred to [facility] service on 7/20.  See below for further details.   Significant events: 7/08>> admit to ICU-septic shock/respiratory failure.  Intubated. 7/10>> Thoracic and lumbar spine: Osteomyelitis/discitis and epidural abscess.  PCCM discussed with neurosurgery-not a surgical candidate. 7/18>> extubated. 7/20>> transferred to [facility].  Rediscussed case with neurosurgery-Joshua Glenford Peers, NP-not a Surgical candidate.  Significant studies: 7/08>> CT head: No acute intracranial pathology. 7/08>> CT C-spine: No fracture/subluxation 7/08>> CT abdomen/pelvis: Lytic appearance of superior endplate L1-gas within the disc space concerning for discitis/osteomyelitis.  Previously seen pancreatic head lesion appears resolved. 7/09>> MRI brain: Please a few small acute infarcts in the left frontoparietal cortex. 7/09>> MRI C-spine: No osteomyelitis/discitis. 7/09>> MRI thoracic/lumbar spine: Osteomyelitis/discitis at T12-L1, small epidural collection at T12 with ventral epidural extension of the T9.  4.4 paraspinous fluid collection at T11, T12 suspicious for abscess. 7/09>> Echo: EF 34-19%, grade 1 diastolic dysfunction. 7/11>> LTM EEG: No seizures. 7/13>> Spot EEG: No seizures. 7/19>> renal ultrasound: No hydronephrosis.  Significant microbiology data: 7/8>> COVID/influenza PCR: Negative 7/8>> blood  culture: Klebsiella pneumoniae  Procedures: 7/8-7/18>> ETT 7/10>> cortrak placed  Consults: PCCM ID Nephrology Neurology Palliative care  Subjective: Remains unchanged-moans/groans when examined.  No other issues overnight.  Objective: Vitals: Blood pressure (!) 163/73, pulse 69, temperature (!) 97.5 F (36.4 C), temperature source Axillary, resp. rate 14, height '5\' 5"'$  (1.651 m), weight 84.8 kg, SpO2 96 %.   Exam: Gen Exam: Awake-but confused.  Moans/groans when I start examining her HEENT:atraumatic, normocephalic Chest: B/L clear to auscultation anteriorly CVS:S1S2 regular Abdomen:soft non tender, non distended Extremities:no edema Neurology: Difficult exam but severely debilitated-seems to withdraw all 4 extremities to pain. Skin: no rash   Pertinent Labs/Radiology:    Latest Ref Rng & Units 08/21/2021    3:33 AM 08/20/2021    5:00 AM 08/19/2021    3:12 AM  CBC  WBC 4.0 - 10.5 K/uL 7.1  9.7  9.3   Hemoglobin 12.0 - 15.0 g/dL 8.6  8.6  5.9   Hematocrit 36.0 - 46.0 % 26.3  26.0  17.9   Platelets 150 - 400 K/uL 141  136  123     Lab Results  Component Value Date   NA 135 08/21/2021   K 3.9 08/21/2021   CL 104 08/21/2021   CO2 23 08/21/2021     Assessment/Plan: Septic shock due to Klebsiella pneumonia bacteremia with T12-L1 osteomyelitis epidural abscess/paraspinal collection: Sepsis physiology has resolved-Per neurosurgery-not a candidate for incision/drainage of epidural abscess.  Significantly weak/debilitated but moving all 4 extremities.  On IV Ancef for now-ID recommending lifelong cefadroxil when a bit more stable.    Acute metabolic encephalopathy: Multifactorial due to AKI with significantly elevated BUN, ICU delirium, sepsis/hypoxemia superimposed on dementia.  Neuroimaging/EEG as above.  Confused-unchanged for the past several days.  BUN remains significantly elevated.  Continue supportive care-neurology had seen earlier and has subsequently signed off.   BUN remains significantly elevated.    Acute hypoxic respiratory failure due to compromised airway: Extubated on 7/18-currently on room air or on minimal amounts of oxygen.  Continue supportive care.   AKI on CKD stage IIIb: AKI felt to be hemodynamically mediated-BUN still significantly elevated-appreciate nephrology input-Per nephrology-patient not a candidate for HD.  Continue supportive care-apart from continue to treat underlying infectious issues-unclear whether there is anything else that can actually help the kidney function.  Remains on free water via PEG tube.  Was on IV albumin for several days.  Hypernatremia: Resolved with free water via NG tube.  Anasarca/volume overload: Due to hypoalbuminemia-on IV albumin-getting free water through NG tube.  May need to initiate Lasix at some point in time.    Dysphagia: Due to critical illness-encephalopathy-severe muscular deconditioning-NG tube in place-continue tube feeds.  Do not think she is ready for any sort of oral intake at this point.  Suspect if mentation does not improve-May require insertion of a PEG tube at some point in time in the future.  Anemia due to critical illness/CKD: Significant drop in hemoglobin today-plan to transfuse PRBC-recheck CBC tomorrow.  No overt bleeding apparent.  Thrombocytopenia: Due to sepsis-resolved.  Acute CVA: Mostly incidental finding on MRI-Per last neurology note on 7/15-further work-up can be considered if her clinical/lab situation improves.  COPD: Not in exacerbation-continue bronchodilators.  DM-2: CBG stable with SSI.  Recent Labs    08/21/21 0401 08/21/21 0816 08/21/21 1148  GLUCAP 255* 208* 211*     HTN: BP remains on the higher side-increase Coreg to 12.5 mg twice daily-suspect pain continues to be playing a major role.      Anxiety/depression: Currenttly encephalopathic-minimize sedatives at this point.  Will need to resume Lexapro when she is a bit more stable.  Dementia: See  above regarding encephalopathy.  Severe debility/deconditioning: Obtaining PT/OT eval-likely will need either LTACH or SNF on discharge.    Goals of care/palliative care: Full code-while she was in the ICU-extensive discussion was done by [facility] MD/palliative care with family-who wished to continue full scope of treatment-they are aware of poor overall prognosis.  This MD had a conversation with daughter [name] on 7/22-made aware of poor long-term prognosis.  Nutrition Status: Nutrition Problem: Moderate Malnutrition Etiology: chronic illness Signs/Symptoms: moderate fat depletion, moderate muscle depletion Interventions: Refer to RD note for recommendations, Tube feeding   Pressure Ulcer: Pressure Injury 08/23/2021 Coccyx Right;Mid;Left Unstageable - Full thickness tissue loss in which the base of the injury is covered by slough (yellow, tan, gray, green or brown) and/or eschar (tan, brown or black) in the wound bed. eschar (Active)  08/06/2021 1730  Location: Coccyx  Location Orientation: Right;Mid;Left  Staging: Unstageable - Full thickness tissue loss in which the base of the injury is covered by slough (yellow, tan, gray, green or brown) and/or eschar (tan, brown or black) in the wound bed.  Wound Description (Comments): eschar  Present on Admission:   Dressing Type Foam - Lift dressing to assess site every shift 08/21/21 0745    BMI: Estimated body mass index is 31.11 kg/m as calculated from the following:   Height as of this encounter: '5\' 5"'$  (1.651 m).   Weight as of this encounter: 84.8 kg.   Code status:   Code Status: Full Code   DVT Prophylaxis: heparin injection 5,000 Units Start: 08/12/21 1400 Place and maintain sequential compression device Start: 08/27/2021 1510   Family  Communication: [phone] updated at bedside on 7/24.   Disposition Plan: Status is: Inpatient Remains inpatient appropriate because: Septic shock-Klebsiella bacteremia-epidural  abscess/osteomyelitis-severe encephalopathy-remains n.p.o.-NG tube in place.  Not yet stable for discharge.   Planned Discharge Destination:Skilled nursing facility versus LTACH.  Awaiting insurance authorization for LTACH   Diet: Diet Order             Diet NPO time specified  Diet effective now                     Antimicrobial agents: Anti-infectives (From admission, onward)    Start     Dose/Rate Route Frequency Ordered Stop   08/17/21 1200  cefTRIAXone (ROCEPHIN) 2 g in sodium chloride 0.9 % 100 mL IVPB  Status:  Discontinued        2 g 200 mL/hr over 30 Minutes Intravenous Every 12 hours 08/17/21 1046 08/17/21 1058   08/17/21 1145  ceFAZolin (ANCEF) IVPB 2g/100 mL premix        2 g 200 mL/hr over 30 Minutes Intravenous Every 12 hours 08/17/21 1058     08/16/21 2200  cefadroxil (DURICEF) capsule 500 mg  Status:  Discontinued        500 mg Oral 2 times daily 08/16/21 0917 08/17/21 1046   08/06/21 1000  cefTRIAXone (ROCEPHIN) 2 g in sodium chloride 0.9 % 100 mL IVPB  Status:  Discontinued        2 g 200 mL/hr over 30 Minutes Intravenous Every 12 hours 08/06/21 0757 08/16/21 0917   08/05/21 1400  ceFEPIme (MAXIPIME) 1 g in sodium chloride 0.9 % 100 mL IVPB  Status:  Discontinued        1 g 200 mL/hr over 30 Minutes Intravenous Every 24 hours 08/06/2021 1302 08/05/21 0752   08/05/21 1400  ceFEPIme (MAXIPIME) 2 g in sodium chloride 0.9 % 100 mL IVPB  Status:  Discontinued        2 g 200 mL/hr over 30 Minutes Intravenous Every 24 hours 08/05/21 0752 08/05/21 1048   08/05/21 1200  ceFEPIme (MAXIPIME) 2 g in sodium chloride 0.9 % 100 mL IVPB  Status:  Discontinued        2 g 200 mL/hr over 30 Minutes Intravenous Every 24 hours 08/05/21 1048 08/06/21 0757   08/05/2021 1945  vancomycin (VANCOREADY) IVPB 1250 mg/250 mL        1,250 mg 166.7 mL/hr over 90 Minutes Intravenous  Once 08/05/2021 1851 08/05/21 1045   08/16/2021 1315  vancomycin (VANCOREADY) IVPB 1250 mg/250 mL  Status:   Discontinued        1,250 mg 166.7 mL/hr over 90 Minutes Intravenous  Once 08/03/2021 1302 08/22/2021 1851   08/22/2021 1301  vancomycin variable dose per unstable renal function (pharmacist dosing)  Status:  Discontinued         Does not apply See admin instructions 08/27/2021 1302 08/05/21 1125   08/22/2021 1300  ceFEPIme (MAXIPIME) 2 g in sodium chloride 0.9 % 100 mL IVPB        2 g 200 mL/hr over 30 Minutes Intravenous  Once 08/26/2021 1252 08/05/21 0255   07/30/2021 1300  metroNIDAZOLE (FLAGYL) IVPB 500 mg  Status:  Discontinued        500 mg 100 mL/hr over 60 Minutes Intravenous  Once 07/29/2021 1252 08/05/21 1125   07/30/2021 1300  vancomycin (VANCOCIN) IVPB 1000 mg/200 mL premix  Status:  Discontinued        1,000 mg 200 mL/hr over 60 Minutes  Intravenous  Once 08/03/2021 1252 08/13/2021 1302        MEDICATIONS: Scheduled Meds:  carvedilol  6.25 mg Per Tube BID WC   Chlorhexidine Gluconate Cloth  6 each Topical Daily   darbepoetin (ARANESP) injection - NON-DIALYSIS  60 mcg Subcutaneous Q Thu-1800   docusate  100 mg Per Tube BID   free water  400 mL Per Tube Q8H   heparin injection (subcutaneous)  5,000 Units Subcutaneous Q8H   insulin aspart  0-15 Units Subcutaneous Q4H   insulin aspart  2 Units Subcutaneous Q4H   nutrition supplement (JUVEN)  1 packet Per Tube BID BM   mouth rinse  15 mL Mouth Rinse 4 times per day   pantoprazole sodium  40 mg Per Tube BID   polyethylene glycol  17 g Per Tube Daily   sodium chloride flush  10-40 mL Intracatheter Q12H   Continuous Infusions:  sodium chloride 250 mL (08/15/21 0945)    ceFAZolin (ANCEF) IV 2 g (08/21/21 0927)   feeding supplement (VITAL AF 1.2 CAL) 60 mL/hr at 08/21/21 0400   PRN Meds:.acetaminophen, dextrose, hydrALAZINE, HYDROmorphone (DILAUDID) injection, ipratropium-albuterol, mouth rinse, mouth rinse, oxyCODONE, sodium chloride flush, white petrolatum   I have personally reviewed following labs and imaging studies  LABORATORY  DATA: CBC: Recent Labs  Lab 08/17/21 0401 08/18/21 0415 08/19/21 0312 08/20/21 0500 08/21/21 0333  WBC 12.6* 11.8* 9.3 9.7 7.1  HGB 7.7* 7.0* 5.9* 8.6* 8.6*  HCT 23.8* 21.0* 17.9* 26.0* 26.3*  MCV 91.5 90.5 91.3 89.3 90.7  PLT 137* 130* 123* 136* 141*     Basic Metabolic Panel: Recent Labs  Lab 08/15/21 0442 08/16/21 0420 08/17/21 0401 08/18/21 0415 08/19/21 1050 08/20/21 0500 08/21/21 0333  NA 148* 146* 147* 146* 141 138 135  K 3.6 3.8 3.7 3.7 3.5 3.6 3.9  CL 117* 114* 115* 114* 108 108 104  CO2 '22 22 23 23 23 24 23  '$ GLUCOSE 164* 220* 144* 215* 175* 99 267*  BUN 143* 145* 139* 132* 133* 124* 135*  CREATININE 1.98* 1.78* 1.54* 1.50* 1.67* 1.62* 1.77*  CALCIUM 9.4 9.4 9.7 9.6 9.5 9.4 9.2  MG 2.1 1.9 2.0  --   --   --   --   PHOS  --  3.6  --  2.1* 2.1* 2.4* 2.8     GFR: Estimated Creatinine Clearance: 29.1 mL/min (A) (by C-G formula based on SCr of 1.77 mg/dL (H)).  Liver Function Tests: Recent Labs  Lab 08/17/21 0401 08/18/21 0415 08/19/21 1050 08/20/21 0500 08/21/21 0333  AST 36  --   --   --   --   ALT 52*  --   --   --   --   ALKPHOS 112  --   --   --   --   BILITOT 1.2  --   --   --   --   PROT 5.9*  --   --   --   --   ALBUMIN 2.5* 2.7* 2.9* 3.0* 2.5*    No results for input(s): "LIPASE", "AMYLASE" in the last 168 hours. No results for input(s): "AMMONIA" in the last 168 hours.  Coagulation Profile: No results for input(s): "INR", "PROTIME" in the last 168 hours.  Cardiac Enzymes: No results for input(s): "CKTOTAL", "CKMB", "CKMBINDEX", "TROPONINI" in the last 168 hours.  BNP (last 3 results) No results for input(s): "PROBNP" in the last 8760 hours.  Lipid Profile: No results for input(s): "CHOL", "HDL", "LDLCALC", "TRIG", "CHOLHDL", "LDLDIRECT"  in the last 72 hours.  Thyroid Function Tests: No results for input(s): "TSH", "T4TOTAL", "FREET4", "T3FREE", "THYROIDAB" in the last 72 hours.  Anemia Panel: No results for input(s):  "VITAMINB12", "FOLATE", "FERRITIN", "TIBC", "IRON", "RETICCTPCT" in the last 72 hours.  Urine analysis:    Component Value Date/Time   COLORURINE AMBER (A) 08/15/2021 1550   APPEARANCEUR TURBID (A) 08/15/2021 1550   LABSPEC 1.014 08/15/2021 1550   LABSPEC 1.020 10/12/2018 1055   PHURINE 5.0 08/15/2021 1550   GLUCOSEU NEGATIVE 08/15/2021 1550   HGBUR NEGATIVE 08/15/2021 1550   BILIRUBINUR NEGATIVE 08/15/2021 1550   BILIRUBINUR negative 10/12/2018 1055   BILIRUBINUR n 07/01/2016 1139   KETONESUR NEGATIVE 08/15/2021 1550   PROTEINUR 100 (A) 08/15/2021 1550   UROBILINOGEN negative (A) 07/01/2016 1139   NITRITE NEGATIVE 08/15/2021 1550   LEUKOCYTESUR LARGE (A) 08/15/2021 1550    Sepsis Labs: Lactic Acid, Venous    Component Value Date/Time   LATICACIDVEN 1.4 08/09/2021 0401    MICROBIOLOGY: No results found for this or any previous visit (from the past 240 hour(s)).  RADIOLOGY STUDIES/RESULTS: No results found.   LOS: 17 days   Oren Binet, MD  [facility]    To contact the attending provider between 7A-7P or the covering provider during after hours 7P-7A, please log into the web site [email or web address] and access using universal [place] password for that web site. If you do not have the password, please call the hospital operator.  08/21/2021, 12:58 PM

## 2021-08-21 NOTE — Inpatient Diabetes Management (Signed)
Inpatient Diabetes Program Recommendations  AACE/ADA: New Consensus Statement on Inpatient Glycemic Control   Target Ranges:  Prepandial:   less than 140 mg/dL      Peak postprandial:   less than 180 mg/dL (1-2 hours)      Critically ill patients:  140 - 180 mg/dL    Latest Reference Range & Units 08/21/21 00:04 08/21/21 04:01 08/21/21 08:16  Glucose-Capillary 70 - 99 mg/dL 267 (H) 255 (H) 208 (H)    Latest Reference Range & Units 08/20/21 04:22 08/20/21 08:25 08/20/21 11:24 08/20/21 15:29 08/20/21 20:43 08/20/21 23:30  Glucose-Capillary 70 - 99 mg/dL 122 (H) 80 138 (H) 161 (H) 209 (H) 257 (H)   Review of Glycemic Control  Diabetes history: DM2 Outpatient Diabetes medications: Tresiba 20 units daily, Fiasp 5-7 units TID with meals Current orders for Inpatient glycemic control: Novolog 0-15 units Q4H, Novolog 2 units Q4H; Vital @ 60 ml/hr  Inpatient Diabetes Program Recommendations:    Insulin: Please consider increasing tube feeding insulin coverage to Novolog 4 units Q4H. If tube feeding is stopped or held then Novolog tube feeding coverage should also be stopped or held.  Thanks, Barnie Alderman, RN, MSN, [facility] Coordinator Inpatient Diabetes Program [phone] (Team Pager from 8am to [place])

## 2021-08-22 DIAGNOSIS — A414 Sepsis due to anaerobes: Secondary | ICD-10-CM | POA: Diagnosis not present

## 2021-08-22 DIAGNOSIS — G9340 Encephalopathy, unspecified: Secondary | ICD-10-CM | POA: Diagnosis not present

## 2021-08-22 DIAGNOSIS — R7881 Bacteremia: Secondary | ICD-10-CM | POA: Diagnosis not present

## 2021-08-22 DIAGNOSIS — J9601 Acute respiratory failure with hypoxia: Secondary | ICD-10-CM | POA: Diagnosis not present

## 2021-08-22 LAB — GLUCOSE, CAPILLARY
Glucose-Capillary: 125 mg/dL — ABNORMAL HIGH (ref 70–99)
Glucose-Capillary: 132 mg/dL — ABNORMAL HIGH (ref 70–99)
Glucose-Capillary: 158 mg/dL — ABNORMAL HIGH (ref 70–99)
Glucose-Capillary: 212 mg/dL — ABNORMAL HIGH (ref 70–99)
Glucose-Capillary: 93 mg/dL (ref 70–99)
Glucose-Capillary: 95 mg/dL (ref 70–99)

## 2021-08-22 MED ORDER — FENTANYL 25 MCG/HR TD PT72
1.0000 | MEDICATED_PATCH | TRANSDERMAL | Status: DC
  Administered 2021-08-22: 1 via TRANSDERMAL
  Filled 2021-08-22: qty 1

## 2021-08-22 NOTE — Plan of Care (Signed)
  Problem: Coping: Goal: Ability to adjust to condition or change in health will improve Outcome: Not Progressing   Problem: Nutritional: Goal: Progress toward achieving an optimal weight will improve Outcome: Not Progressing   Problem: Skin Integrity: Goal: Risk for impaired skin integrity will decrease Outcome: Not Progressing

## 2021-08-22 NOTE — Progress Notes (Signed)
PROGRESS NOTE        PATIENT DETAILS Name: Courtney Goodman Age: 76 y.o. Sex: female Date of Birth: Oct 30, 1945 Admit Date: 08/22/2021 Admitting Physician Chesley Mires, MD [id number], Mare Ferrari, PA-C  Brief Summary: Patient is a 76 y.o.  female history of DM-2, HTN, HLD, CVA, anxiety/depression, dementia who was admitted to the ICU with acute hypoxic respiratory failure and septic shock due to Klebsiella bacteremia-found to have epidural abscesses (not a surgical candidate).  Had a prolonged ICU stay complicated by severe encephalopathy, AKI-stabilized and subsequently transferred to [facility] service on 7/20.  See below for further details.   Significant events: 7/08>> admit to ICU-septic shock/respiratory failure.  Intubated. 7/10>> Thoracic and lumbar spine: Osteomyelitis/discitis and epidural abscess.  PCCM discussed with neurosurgery-not a surgical candidate. 7/18>> extubated. 7/20>> transferred to [facility].  Rediscussed case with neurosurgery-Joshua Glenford Peers, NP-not a Surgical candidate.  Significant studies: 7/08>> CT head: No acute intracranial pathology. 7/08>> CT C-spine: No fracture/subluxation 7/08>> CT abdomen/pelvis: Lytic appearance of superior endplate L1-gas within the disc space concerning for discitis/osteomyelitis.  Previously seen pancreatic head lesion appears resolved. 7/09>> MRI brain: Please a few small acute infarcts in the left frontoparietal cortex. 7/09>> MRI C-spine: No osteomyelitis/discitis. 7/09>> MRI thoracic/lumbar spine: Osteomyelitis/discitis at T12-L1, small epidural collection at T12 with ventral epidural extension of the T9.  4.4 paraspinous fluid collection at T11, T12 suspicious for abscess. 7/09>> Echo: EF 26-33%, grade 1 diastolic dysfunction. 7/11>> LTM EEG: No seizures. 7/13>> Spot EEG: No seizures. 7/19>> renal ultrasound: No hydronephrosis.  Significant microbiology data: 7/8>> COVID/influenza PCR: Negative 7/8>> blood  culture: Klebsiella pneumoniae  Procedures: 7/8-7/18>> ETT 7/10>> cortrak placed  Consults: PCCM ID Nephrology Neurology Palliative care  Subjective: Unchanged-moaning/groaning when touched-and even when lying spontaneously.  Appears to be in pain.  Daughter at bedside.  Confused-not following commands.  Objective: Vitals: Blood pressure 138/60, pulse 75, temperature 98.8 F (37.1 C), temperature source Axillary, resp. rate 15, height '5\' 5"'$  (1.651 m), weight 84.8 kg, SpO2 94 %.   Exam: Gen Exam: Confused-not following commands.  Appears in some distress-moaning/groaning. HEENT:atraumatic, normocephalic Chest: B/L clear to auscultation anteriorly CVS:S1S2 regular Abdomen:soft non tender, non distended Extremities:no edema Neurology: Significant generalized weakness-but seems to be withdrawing to pain. Skin: no rash   Pertinent Labs/Radiology:    Latest Ref Rng & Units 08/21/2021    3:33 AM 08/20/2021    5:00 AM 08/19/2021    3:12 AM  CBC  WBC 4.0 - 10.5 K/uL 7.1  9.7  9.3   Hemoglobin 12.0 - 15.0 g/dL 8.6  8.6  5.9   Hematocrit 36.0 - 46.0 % 26.3  26.0  17.9   Platelets 150 - 400 K/uL 141  136  123     Lab Results  Component Value Date   NA 135 08/21/2021   K 3.9 08/21/2021   CL 104 08/21/2021   CO2 23 08/21/2021     Assessment/Plan: Septic shock due to Klebsiella pneumonia bacteremia with T12-L1 osteomyelitis epidural abscess/paraspinal collection: Sepsis physiology has resolved-Per neurosurgery-not a candidate for incision/drainage of epidural abscess.  Significantly weak/debilitated but moving all 4 extremities.  On IV Ancef for now-ID recommending lifelong cefadroxil when a bit more stable.    Acute metabolic encephalopathy: Multifactorial due to AKI with significantly elevated BUN, ICU delirium, sepsis/hypoxemia superimposed on dementia.  Neuroimaging/EEG as above.  Remains confused-BUN remains significantly elevated.  Acute hypoxic respiratory failure due to  compromised airway: Extubated on 7/18-currently on room air or on minimal amounts of oxygen.  Continue supportive care.   AKI on CKD stage IIIb: AKI felt to be hemodynamically mediated-BUN still significantly elevated-appreciate nephrology input-Per nephrology-patient not a candidate for HD.  Continue supportive care-apart from continue to treat underlying infectious issues-unclear whether there is anything else that can actually help the kidney function.  Remains on free water via PEG tube.  Was on IV albumin for several days.  Hypernatremia: Resolved with free water via NG tube.  Anasarca/volume overload: Due to hypoalbuminemia-on IV albumin-getting free water through NG tube.  May need to initiate Lasix at some point in time.    Dysphagia: Due to critical illness-encephalopathy-severe muscular deconditioning-NG tube in place-continue tube feeds.  Do not think she is ready for any sort of oral intake at this point.  Suspect if mentation does not improve-May require insertion of a PEG tube at some point in time in the future.  Anemia due to critical illness/CKD: Significant drop in hemoglobin today-plan to transfuse PRBC-recheck CBC tomorrow.  No overt bleeding apparent.  Thrombocytopenia: Due to sepsis-resolved.  Acute CVA: Mostly incidental finding on MRI-Per last neurology note on 7/15-further work-up can be considered if her clinical/lab situation improves.  COPD: Not in exacerbation-continue bronchodilators.  DM-2: CBG stable with SSI.  Recent Labs    08/22/21 0017 08/22/21 0305 08/22/21 0742  GLUCAP 212* 158* 93     HTN: BP better-continue Coreg.  Follow/optimize.  Anxiety/depression: Currenttly encephalopathic-minimize sedatives at this point.  Will need to resume Lexapro when she is a bit more stable.  Dementia: See above regarding encephalopathy.  Severe debility/deconditioning: Obtaining PT/OT eval-likely will need either LTACH or SNF on discharge.    Goals of  care/palliative care: Full code-while she was in the ICU-extensive discussion was done by [facility] MD/palliative care with family-who wished to continue full scope of treatment-they are aware of poor overall prognosis.  This MD had a conversation with daughter [name] on 7/22-made aware of poor long-term prognosis.  Nutrition Status: Nutrition Problem: Moderate Malnutrition Etiology: chronic illness Signs/Symptoms: moderate fat depletion, moderate muscle depletion Interventions: Juven, Tube feeding, Prostat   Pressure Ulcer: Pressure Injury 08/19/2021 Coccyx Right;Mid;Left Unstageable - Full thickness tissue loss in which the base of the injury is covered by slough (yellow, tan, gray, green or brown) and/or eschar (tan, brown or black) in the wound bed. eschar (Active)  08/07/2021 1730  Location: Coccyx  Location Orientation: Right;Mid;Left  Staging: Unstageable - Full thickness tissue loss in which the base of the injury is covered by slough (yellow, tan, gray, green or brown) and/or eschar (tan, brown or black) in the wound bed.  Wound Description (Comments): eschar  Present on Admission:   Dressing Type Foam - Lift dressing to assess site every shift 08/21/21 2000    BMI: Estimated body mass index is 31.11 kg/m as calculated from the following:   Height as of this encounter: '5\' 5"'$  (1.651 m).   Weight as of this encounter: 84.8 kg.   Code status:   Code Status: Full Code   DVT Prophylaxis: heparin injection 5,000 Units Start: 08/12/21 1400 Place and maintain sequential compression device Start: 08/01/2021 1510   Family Communication: Daughter-[name] updated at bedside on 7/26   Disposition Plan: Status is: Inpatient Remains inpatient appropriate because: Septic shock-Klebsiella bacteremia-epidural abscess/osteomyelitis-severe encephalopathy-remains n.p.o.-NG tube in place.  Not yet stable for discharge.   Planned Discharge Destination:Skilled nursing facility versus LTACH.  Awaiting insurance authorization for LTACH   Diet: Diet Order             Diet NPO time specified  Diet effective now                     Antimicrobial agents: Anti-infectives (From admission, onward)    Start     Dose/Rate Route Frequency Ordered Stop   08/17/21 1200  cefTRIAXone (ROCEPHIN) 2 g in sodium chloride 0.9 % 100 mL IVPB  Status:  Discontinued        2 g 200 mL/hr over 30 Minutes Intravenous Every 12 hours 08/17/21 1046 08/17/21 1058   08/17/21 1145  ceFAZolin (ANCEF) IVPB 2g/100 mL premix        2 g 200 mL/hr over 30 Minutes Intravenous Every 12 hours 08/17/21 1058     08/16/21 2200  cefadroxil (DURICEF) capsule 500 mg  Status:  Discontinued        500 mg Oral 2 times daily 08/16/21 0917 08/17/21 1046   08/06/21 1000  cefTRIAXone (ROCEPHIN) 2 g in sodium chloride 0.9 % 100 mL IVPB  Status:  Discontinued        2 g 200 mL/hr over 30 Minutes Intravenous Every 12 hours 08/06/21 0757 08/16/21 0917   08/05/21 1400  ceFEPIme (MAXIPIME) 1 g in sodium chloride 0.9 % 100 mL IVPB  Status:  Discontinued        1 g 200 mL/hr over 30 Minutes Intravenous Every 24 hours 08/15/2021 1302 08/05/21 0752   08/05/21 1400  ceFEPIme (MAXIPIME) 2 g in sodium chloride 0.9 % 100 mL IVPB  Status:  Discontinued        2 g 200 mL/hr over 30 Minutes Intravenous Every 24 hours 08/05/21 0752 08/05/21 1048   08/05/21 1200  ceFEPIme (MAXIPIME) 2 g in sodium chloride 0.9 % 100 mL IVPB  Status:  Discontinued        2 g 200 mL/hr over 30 Minutes Intravenous Every 24 hours 08/05/21 1048 08/06/21 0757   08/23/2021 1945  vancomycin (VANCOREADY) IVPB 1250 mg/250 mL        1,250 mg 166.7 mL/hr over 90 Minutes Intravenous  Once 08/14/2021 1851 08/05/21 1045   08/03/2021 1315  vancomycin (VANCOREADY) IVPB 1250 mg/250 mL  Status:  Discontinued        1,250 mg 166.7 mL/hr over 90 Minutes Intravenous  Once 07/28/2021 1302 08/16/2021 1851   08/14/2021 1301  vancomycin variable dose per unstable renal function  (pharmacist dosing)  Status:  Discontinued         Does not apply See admin instructions 08/18/2021 1302 08/05/21 1125   08/10/2021 1300  ceFEPIme (MAXIPIME) 2 g in sodium chloride 0.9 % 100 mL IVPB        2 g 200 mL/hr over 30 Minutes Intravenous  Once 08/05/2021 1252 08/05/21 0255   08/03/2021 1300  metroNIDAZOLE (FLAGYL) IVPB 500 mg  Status:  Discontinued        500 mg 100 mL/hr over 60 Minutes Intravenous  Once 08/23/2021 1252 08/05/21 1125   08/07/2021 1300  vancomycin (VANCOCIN) IVPB 1000 mg/200 mL premix  Status:  Discontinued        1,000 mg 200 mL/hr over 60 Minutes Intravenous  Once 08/06/2021 1252 07/29/2021 1302        MEDICATIONS: Scheduled Meds:  carvedilol  12.5 mg Per Tube BID WC   Chlorhexidine Gluconate Cloth  6 each Topical Daily   darbepoetin (ARANESP) injection - NON-DIALYSIS  60 mcg Subcutaneous Q Thu-1800   docusate  100 mg Per Tube BID   feeding supplement (PROSource TF)  45 mL Per Tube BID   fentaNYL  1 patch Transdermal Q72H   free water  400 mL Per Tube Q8H   heparin injection (subcutaneous)  5,000 Units Subcutaneous Q8H   insulin aspart  0-15 Units Subcutaneous Q4H   insulin aspart  2 Units Subcutaneous Q4H   nutrition supplement (JUVEN)  1 packet Per Tube BID BM   mouth rinse  15 mL Mouth Rinse 4 times per day   pantoprazole sodium  40 mg Per Tube BID   polyethylene glycol  17 g Per Tube Daily   sodium chloride flush  10-40 mL Intracatheter Q12H   Continuous Infusions:  sodium chloride 250 mL (08/15/21 0945)    ceFAZolin (ANCEF) IV 2 g (08/22/21 0934)   feeding supplement (GLUCERNA 1.5 CAL) 45 mL/hr at 08/22/21 0511   PRN Meds:.acetaminophen, dextrose, hydrALAZINE, HYDROmorphone (DILAUDID) injection, ipratropium-albuterol, mouth rinse, mouth rinse, oxyCODONE, sodium chloride flush, white petrolatum   I have personally reviewed following labs and imaging studies  LABORATORY DATA: CBC: Recent Labs  Lab 08/17/21 0401 08/18/21 0415 08/19/21 0312  08/20/21 0500 08/21/21 0333  WBC 12.6* 11.8* 9.3 9.7 7.1  HGB 7.7* 7.0* 5.9* 8.6* 8.6*  HCT 23.8* 21.0* 17.9* 26.0* 26.3*  MCV 91.5 90.5 91.3 89.3 90.7  PLT 137* 130* 123* 136* 141*     Basic Metabolic Panel: Recent Labs  Lab 08/16/21 0420 08/17/21 0401 08/18/21 0415 08/19/21 1050 08/20/21 0500 08/21/21 0333  NA 146* 147* 146* 141 138 135  K 3.8 3.7 3.7 3.5 3.6 3.9  CL 114* 115* 114* 108 108 104  CO2 '22 23 23 23 24 23  '$ GLUCOSE 220* 144* 215* 175* 99 267*  BUN 145* 139* 132* 133* 124* 135*  CREATININE 1.78* 1.54* 1.50* 1.67* 1.62* 1.77*  CALCIUM 9.4 9.7 9.6 9.5 9.4 9.2  MG 1.9 2.0  --   --   --   --   PHOS 3.6  --  2.1* 2.1* 2.4* 2.8     GFR: Estimated Creatinine Clearance: 29.1 mL/min (A) (by C-G formula based on SCr of 1.77 mg/dL (H)).  Liver Function Tests: Recent Labs  Lab 08/17/21 0401 08/18/21 0415 08/19/21 1050 08/20/21 0500 08/21/21 0333  AST 36  --   --   --   --   ALT 52*  --   --   --   --   ALKPHOS 112  --   --   --   --   BILITOT 1.2  --   --   --   --   PROT 5.9*  --   --   --   --   ALBUMIN 2.5* 2.7* 2.9* 3.0* 2.5*    No results for input(s): "LIPASE", "AMYLASE" in the last 168 hours. No results for input(s): "AMMONIA" in the last 168 hours.  Coagulation Profile: No results for input(s): "INR", "PROTIME" in the last 168 hours.  Cardiac Enzymes: No results for input(s): "CKTOTAL", "CKMB", "CKMBINDEX", "TROPONINI" in the last 168 hours.  BNP (last 3 results) No results for input(s): "PROBNP" in the last 8760 hours.  Lipid Profile: No results for input(s): "CHOL", "HDL", "LDLCALC", "TRIG", "CHOLHDL", "LDLDIRECT" in the last 72 hours.  Thyroid Function Tests: No results for input(s): "TSH", "T4TOTAL", "FREET4", "T3FREE", "THYROIDAB" in the last 72 hours.  Anemia Panel: No results for input(s): "VITAMINB12", "FOLATE", "FERRITIN", "TIBC", "IRON", "RETICCTPCT" in the last  72 hours.  Urine analysis:    Component Value Date/Time    COLORURINE AMBER (A) 08/15/2021 1550   APPEARANCEUR TURBID (A) 08/15/2021 1550   LABSPEC 1.014 08/15/2021 1550   LABSPEC 1.020 10/12/2018 1055   PHURINE 5.0 08/15/2021 1550   GLUCOSEU NEGATIVE 08/15/2021 1550   HGBUR NEGATIVE 08/15/2021 1550   BILIRUBINUR NEGATIVE 08/15/2021 1550   BILIRUBINUR negative 10/12/2018 1055   BILIRUBINUR n 07/01/2016 1139   KETONESUR NEGATIVE 08/15/2021 1550   PROTEINUR 100 (A) 08/15/2021 1550   UROBILINOGEN negative (A) 07/01/2016 1139   NITRITE NEGATIVE 08/15/2021 1550   LEUKOCYTESUR LARGE (A) 08/15/2021 1550    Sepsis Labs: Lactic Acid, Venous    Component Value Date/Time   LATICACIDVEN 1.4 08/09/2021 0401    MICROBIOLOGY: No results found for this or any previous visit (from the past 240 hour(s)).  RADIOLOGY STUDIES/RESULTS: No results found.   LOS: 18 days   Oren Binet, MD  [facility]    To contact the attending provider between 7A-7P or the covering provider during after hours 7P-7A, please log into the web site [email or web address] and access using universal [place] password for that web site. If you do not have the password, please call the hospital operator.  08/22/2021, 10:54 AM

## 2021-08-22 NOTE — Inpatient Diabetes Management (Signed)
Inpatient Diabetes Program Recommendations  AACE/ADA: New Consensus Statement on Inpatient Glycemic Control   Target Ranges:  Prepandial:   less than 140 mg/dL      Peak postprandial:   less than 180 mg/dL (1-2 hours)      Critically ill patients:  140 - 180 mg/dL    Latest Reference Range & Units 08/21/21 08:16 08/21/21 11:48 08/21/21 16:04 08/21/21 20:08 08/22/21 00:17 08/22/21 03:05 08/22/21 07:42  Glucose-Capillary 70 - 99 mg/dL 208 (H) 211 (H) 225 (H) 221 (H) 212 (H) 158 (H) 93   Review of Glycemic Control  Diabetes history: DM2 Outpatient Diabetes medications: Tresiba 20 units daily, Fiasp 5-7 units TID with meals Current orders for Inpatient glycemic control: Novolog 0-15 units Q4H, Novolog 2 units Q4H; Vital @ 60 ml/hr   Inpatient Diabetes Program Recommendations:     Insulin: Please consider increasing tube feeding insulin coverage to Novolog 4 units Q4H. If tube feeding is stopped or held then Novolog tube feeding coverage should also be stopped or held.  Thanks, Barnie Alderman, RN, MSN, [place] Diabetes Coordinator Inpatient Diabetes Program [phone] (Team Pager from 8am to [place])

## 2021-08-23 ENCOUNTER — Inpatient Hospital Stay (HOSPITAL_COMMUNITY): Payer: Medicare HMO

## 2021-08-23 DIAGNOSIS — I679 Cerebrovascular disease, unspecified: Secondary | ICD-10-CM

## 2021-08-23 DIAGNOSIS — D631 Anemia in chronic kidney disease: Secondary | ICD-10-CM

## 2021-08-23 DIAGNOSIS — I129 Hypertensive chronic kidney disease with stage 1 through stage 4 chronic kidney disease, or unspecified chronic kidney disease: Secondary | ICD-10-CM

## 2021-08-23 DIAGNOSIS — F419 Anxiety disorder, unspecified: Secondary | ICD-10-CM

## 2021-08-23 DIAGNOSIS — F32A Depression, unspecified: Secondary | ICD-10-CM

## 2021-08-23 DIAGNOSIS — R7881 Bacteremia: Secondary | ICD-10-CM | POA: Diagnosis not present

## 2021-08-23 DIAGNOSIS — E8770 Fluid overload, unspecified: Secondary | ICD-10-CM

## 2021-08-23 DIAGNOSIS — A414 Sepsis due to anaerobes: Secondary | ICD-10-CM | POA: Diagnosis not present

## 2021-08-23 DIAGNOSIS — E871 Hypo-osmolality and hyponatremia: Secondary | ICD-10-CM

## 2021-08-23 DIAGNOSIS — G9340 Encephalopathy, unspecified: Secondary | ICD-10-CM | POA: Diagnosis not present

## 2021-08-23 DIAGNOSIS — Z79899 Other long term (current) drug therapy: Secondary | ICD-10-CM

## 2021-08-23 DIAGNOSIS — J9601 Acute respiratory failure with hypoxia: Secondary | ICD-10-CM | POA: Diagnosis not present

## 2021-08-23 LAB — RENAL FUNCTION PANEL
Albumin: 2 g/dL — ABNORMAL LOW (ref 3.5–5.0)
Anion gap: 10 (ref 5–15)
BUN: 165 mg/dL — ABNORMAL HIGH (ref 8–23)
CO2: 22 mmol/L (ref 22–32)
Calcium: 9.4 mg/dL (ref 8.9–10.3)
Chloride: 99 mmol/L (ref 98–111)
Creatinine, Ser: 2.31 mg/dL — ABNORMAL HIGH (ref 0.44–1.00)
GFR, Estimated: 21 mL/min — ABNORMAL LOW (ref >60)
Glucose, Bld: 146 mg/dL — ABNORMAL HIGH (ref 70–99)
Phosphorus: 3.5 mg/dL (ref 2.5–4.6)
Potassium: 4.7 mmol/L (ref 3.5–5.1)
Sodium: 131 mmol/L — ABNORMAL LOW (ref 135–145)

## 2021-08-23 LAB — URINALYSIS, ROUTINE W REFLEX MICROSCOPIC
Bilirubin Urine: NEGATIVE
Glucose, UA: 50 mg/dL — AB
Ketones, ur: NEGATIVE mg/dL
Nitrite: NEGATIVE
Protein, ur: 100 mg/dL — AB
Specific Gravity, Urine: 1.01 (ref 1.005–1.030)
WBC, UA: >50 WBC/hpf — ABNORMAL HIGH (ref 0–5)
pH: 5 (ref 5.0–8.0)

## 2021-08-23 LAB — GLUCOSE, CAPILLARY
Glucose-Capillary: 120 mg/dL — ABNORMAL HIGH (ref 70–99)
Glucose-Capillary: 128 mg/dL — ABNORMAL HIGH (ref 70–99)
Glucose-Capillary: 139 mg/dL — ABNORMAL HIGH (ref 70–99)
Glucose-Capillary: 146 mg/dL — ABNORMAL HIGH (ref 70–99)
Glucose-Capillary: 153 mg/dL — ABNORMAL HIGH (ref 70–99)
Glucose-Capillary: 154 mg/dL — ABNORMAL HIGH (ref 70–99)
Glucose-Capillary: 155 mg/dL — ABNORMAL HIGH (ref 70–99)
Glucose-Capillary: 172 mg/dL — ABNORMAL HIGH (ref 70–99)

## 2021-08-23 LAB — CBC
HCT: 25.7 % — ABNORMAL LOW (ref 36.0–46.0)
Hemoglobin: 8.4 g/dL — ABNORMAL LOW (ref 12.0–15.0)
MCH: 29.6 pg (ref 26.0–34.0)
MCHC: 32.7 g/dL (ref 30.0–36.0)
MCV: 90.5 fL (ref 80.0–100.0)
Platelets: 165 10*3/uL (ref 150–400)
RBC: 2.84 MIL/uL — ABNORMAL LOW (ref 3.87–5.11)
RDW: 20.7 % — ABNORMAL HIGH (ref 11.5–15.5)
WBC: 7.6 10*3/uL (ref 4.0–10.5)
nRBC: 0 % (ref 0.0–0.2)

## 2021-08-23 MED ORDER — ALBUMIN HUMAN 25 % IV SOLN
25.0000 g | Freq: Two times a day (BID) | INTRAVENOUS | Status: DC
  Administered 2021-08-23 – 2021-08-24 (×3): 25 g via INTRAVENOUS
  Filled 2021-08-23 (×4): qty 100

## 2021-08-23 MED ORDER — FREE WATER
500.0000 mL | Freq: Three times a day (TID) | Status: DC
  Administered 2021-08-23 – 2021-08-24 (×3): 500 mL

## 2021-08-23 NOTE — Progress Notes (Signed)
PROGRESS NOTE        PATIENT DETAILS Name: Courtney Goodman Age: 76 y.o. Sex: female Date of Birth: [birth year] Admit Date: 08/17/2021 Admitting Physician Chesley Mires, MD EPP:IRJJOACZ, Mare Ferrari, PA-C  Brief Summary: Patient is a 76 y.o.  female history of DM-2, HTN, HLD, CVA, anxiety/depression, dementia who was admitted to the ICU with acute hypoxic respiratory failure and septic shock due to Klebsiella bacteremia-found to have epidural abscesses (not a surgical candidate).  Had a prolonged ICU stay complicated by severe encephalopathy, AKI-stabilized and subsequently transferred to [facility] service on 7/20.  See below for further details.   Significant events: 7/08>> admit to ICU-septic shock/respiratory failure.  Intubated. 7/10>> Thoracic and lumbar spine: Osteomyelitis/discitis and epidural abscess.  PCCM discussed with neurosurgery-not a surgical candidate. 7/18>> extubated. 7/20>> transferred to [facility].  Rediscussed case with neurosurgery-Joshua Glenford Peers, NP-not a Surgical candidate.  Significant studies: 7/08>> CT head: No acute intracranial pathology. 7/08>> CT C-spine: No fracture/subluxation 7/08>> CT abdomen/pelvis: Lytic appearance of superior endplate L1-gas within the disc space concerning for discitis/osteomyelitis.  Previously seen pancreatic head lesion appears resolved. 7/09>> MRI brain: Please a few small acute infarcts in the left frontoparietal cortex. 7/09>> MRI C-spine: No osteomyelitis/discitis. 7/09>> MRI thoracic/lumbar spine: Osteomyelitis/discitis at T12-L1, small epidural collection at T12 with ventral epidural extension of the T9.  4.4 paraspinous fluid collection at T11, T12 suspicious for abscess. 7/09>> Echo: EF 66-06%, grade 1 diastolic dysfunction. 7/11>> LTM EEG: No seizures. 7/13>> Spot EEG: No seizures. 7/19>> renal ultrasound: No hydronephrosis.  Significant microbiology data: 7/8>> COVID/influenza PCR: Negative 7/8>> blood  culture: Klebsiella pneumoniae  Procedures: 7/8-7/18>> ETT 7/10>> cortrak placed  Consults: PCCM ID Nephrology Neurology Palliative care  Subjective: Unchanged-moaning/groaning whenever I attempt to touch her.  No other movements overnight.  Renal function has worsened today.  Objective: Vitals: Blood pressure 126/84, pulse 69, temperature 97.6 F (36.4 C), temperature source Axillary, resp. rate 20, height '5\' 5"'$  (1.651 m), weight 88.1 kg, SpO2 98 %.   Exam: Gen Exam: Confused-moans/groans whenever I attempt to examine her. HEENT:atraumatic, normocephalic Chest: B/L clear to auscultation anteriorly CVS:S1S2 regular Abdomen:soft non tender, non distended Extremities:no edema Neurology: Difficult exam but seems to be moving all 4 extremities. Skin: no rash   Pertinent Labs/Radiology:    Latest Ref Rng & Units 08/23/2021    4:06 AM 08/21/2021    3:33 AM 08/20/2021    5:00 AM  CBC  WBC 4.0 - 10.5 K/uL 7.6  7.1  9.7   Hemoglobin 12.0 - 15.0 g/dL 8.4  8.6  8.6   Hematocrit 36.0 - 46.0 % 25.7  26.3  26.0   Platelets 150 - 400 K/uL 165  141  136     Lab Results  Component Value Date   NA 131 (L) 08/23/2021   K 4.7 08/23/2021   CL 99 08/23/2021   CO2 22 08/23/2021     Assessment/Plan: Septic shock due to Klebsiella pneumonia bacteremia with T12-L1 osteomyelitis epidural abscess/paraspinal collection: Sepsis physiology has resolved-Per neurosurgery-not a candidate for incision/drainage of epidural abscess.  Significantly weak/debilitated but moving all 4 extremities.  On IV Ancef for now-ID recommending lifelong cefadroxil when a bit more stable.    Acute metabolic encephalopathy: Multifactorial due to AKI with significantly elevated BUN, ICU delirium, sepsis/hypoxemia superimposed on dementia.  Neuroimaging/EEG as above.  Remains confused-BUN remains significantly elevated.  Acute hypoxic respiratory failure due to compromised airway: Extubated on 7/18-currently on room  air or on minimal amounts of oxygen.  Continue supportive care.   AKI on CKD stage IIIb: AKI felt to be hemodynamically mediated-BUN still significantly elevated-appreciate nephrology input-Per nephrology-patient not a candidate for HD.  Unfortunately-BUN remains persistently elevated-creatinine has jumped up quite a bit-we will check frequent bladder scans to make sure she is not retaining-repeat UA/renal ultrasound.  Increase free water via PEG tube slightly to 500 cc every 8-restart IV albumin.  I had a long discussion with patient's daughter on 7/27-[name] is aware that if kidneys continue to worsen-the patient is not going to survive this hospitalization-requested that she reconsider hospice.  Have reconsulted palliative care.  Please note-patient is not a candidate for hemodialysis per nephrology note.   Hypernatremia: Resolved with free water via NG tube.  Anasarca/volume overload: Due to hypoalbuminemia-back on IV albumin-getting free water through NG tube.  May need to initiate Lasix at some point in time.    Dysphagia: Due to critical illness-encephalopathy-severe muscular deconditioning-NG tube in place-continue tube feeds.  Continues to be severely encephalopathic-Long discussion with daughter-open to PEG tube but with worsening AKI-we will hold off on that for now.  SLP reevaluated today-not safe for any sort of oral intake.  Continue NG tube feedings in the interim.  Anemia due to critical illness/CKD: Hemoglobin remains stable-transfuse if significant drop in hemoglobin.  Thrombocytopenia: Due to sepsis-resolved.  Acute CVA: Mostly incidental finding on MRI-Per last neurology note on 7/15-further work-up can be considered if her clinical/lab situation improves.  COPD: Not in exacerbation-continue bronchodilators.  DM-2: CBG stable with SSI.  Recent Labs    08/23/21 0103 08/23/21 0355 08/23/21 0756  GLUCAP 155* 139* 146*     HTN: BP better-continue Coreg.   Follow/optimize.  Anxiety/depression: Currenttly encephalopathic-minimize sedatives at this point.  Will need to resume Lexapro when she is a bit more stable.  Dementia: See above regarding encephalopathy.  Severe debility/deconditioning: Continue PT/OT is much as possible.  Goals of care/palliative care: Remains a full code-worsening kidney function-remains persistently encephalopathic-Limited options at further treatment.  Long discussion with daughter-[name] again on 7/27-aware of poor prognosis-we discussed concept of hospice/DNR.  She is agreeable to have another discussion with the palliative care team-I have spoken with Dr. Rowe Pavy from a palliative care team for assistance.  Nutrition Status: Nutrition Problem: Moderate Malnutrition Etiology: chronic illness Signs/Symptoms: moderate fat depletion, moderate muscle depletion Interventions: Juven, Tube feeding, Prostat   Pressure Ulcer: Pressure Injury 08/08/2021 Coccyx Right;Mid;Left Unstageable - Full thickness tissue loss in which the base of the injury is covered by slough (yellow, tan, gray, green or brown) and/or eschar (tan, brown or black) in the wound bed. eschar (Active)  08/27/2021 1730  Location: Coccyx  Location Orientation: Right;Mid;Left  Staging: Unstageable - Full thickness tissue loss in which the base of the injury is covered by slough (yellow, tan, gray, green or brown) and/or eschar (tan, brown or black) in the wound bed.  Wound Description (Comments): eschar  Present on Admission:   Dressing Type Foam - Lift dressing to assess site every shift 08/22/21 2019    BMI: Estimated body mass index is 32.32 kg/m as calculated from the following:   Height as of this encounter: '5\' 5"'$  (1.651 m).   Weight as of this encounter: 88.1 kg.   Code status:   Code Status: Full Code   DVT Prophylaxis: heparin injection 5,000 Units Start: 08/12/21 1400 Place and maintain sequential compression  device Start: 08/07/2021 1510    Family Communication: Daughter-[name]-[phone] updated at bedside on 7/26   Disposition Plan: Status is: Inpatient Remains inpatient appropriate because: Septic shock-Klebsiella bacteremia-epidural abscess/osteomyelitis-severe encephalopathy-remains n.p.o.-NG tube in place.  Not yet stable for discharge.   Planned Discharge Destination:Skilled nursing facility versus LTACH.-Denied by insurance to go to [facility] primarily due to NG tube in place.   Diet: Diet Order             Diet NPO time specified  Diet effective now                     Antimicrobial agents: Anti-infectives (From admission, onward)    Start     Dose/Rate Route Frequency Ordered Stop   08/17/21 1200  cefTRIAXone (ROCEPHIN) 2 g in sodium chloride 0.9 % 100 mL IVPB  Status:  Discontinued        2 g 200 mL/hr over 30 Minutes Intravenous Every 12 hours 08/17/21 1046 08/17/21 1058   08/17/21 1145  ceFAZolin (ANCEF) IVPB 2g/100 mL premix        2 g 200 mL/hr over 30 Minutes Intravenous Every 12 hours 08/17/21 1058     08/16/21 2200  cefadroxil (DURICEF) capsule 500 mg  Status:  Discontinued        500 mg Oral 2 times daily 08/16/21 0917 08/17/21 1046   08/06/21 1000  cefTRIAXone (ROCEPHIN) 2 g in sodium chloride 0.9 % 100 mL IVPB  Status:  Discontinued        2 g 200 mL/hr over 30 Minutes Intravenous Every 12 hours 08/06/21 0757 08/16/21 0917   08/05/21 1400  ceFEPIme (MAXIPIME) 1 g in sodium chloride 0.9 % 100 mL IVPB  Status:  Discontinued        1 g 200 mL/hr over 30 Minutes Intravenous Every 24 hours 08/03/2021 1302 08/05/21 0752   08/05/21 1400  ceFEPIme (MAXIPIME) 2 g in sodium chloride 0.9 % 100 mL IVPB  Status:  Discontinued        2 g 200 mL/hr over 30 Minutes Intravenous Every 24 hours 08/05/21 0752 08/05/21 1048   08/05/21 1200  ceFEPIme (MAXIPIME) 2 g in sodium chloride 0.9 % 100 mL IVPB  Status:  Discontinued        2 g 200 mL/hr over 30 Minutes Intravenous Every 24 hours 08/05/21 1048 08/06/21  0757   08/03/2021 1945  vancomycin (VANCOREADY) IVPB 1250 mg/250 mL        1,250 mg 166.7 mL/hr over 90 Minutes Intravenous  Once 08/17/2021 1851 08/05/21 1045   08/06/2021 1315  vancomycin (VANCOREADY) IVPB 1250 mg/250 mL  Status:  Discontinued        1,250 mg 166.7 mL/hr over 90 Minutes Intravenous  Once 08/07/2021 1302 07/30/2021 1851   08/07/2021 1301  vancomycin variable dose per unstable renal function (pharmacist dosing)  Status:  Discontinued         Does not apply See admin instructions 07/30/2021 1302 08/05/21 1125   08/21/2021 1300  ceFEPIme (MAXIPIME) 2 g in sodium chloride 0.9 % 100 mL IVPB        2 g 200 mL/hr over 30 Minutes Intravenous  Once 08/01/2021 1252 08/05/21 0255   08/13/2021 1300  metroNIDAZOLE (FLAGYL) IVPB 500 mg  Status:  Discontinued        500 mg 100 mL/hr over 60 Minutes Intravenous  Once 08/08/2021 1252 08/05/21 1125   08/08/2021 1300  vancomycin (VANCOCIN) IVPB 1000 mg/200 mL premix  Status:  Discontinued  1,000 mg 200 mL/hr over 60 Minutes Intravenous  Once 08/08/2021 1252 08/13/2021 1302        MEDICATIONS: Scheduled Meds:  carvedilol  12.5 mg Per Tube BID WC   Chlorhexidine Gluconate Cloth  6 each Topical Daily   darbepoetin (ARANESP) injection - NON-DIALYSIS  60 mcg Subcutaneous Q Thu-1800   docusate  100 mg Per Tube BID   feeding supplement (PROSource TF)  45 mL Per Tube BID   fentaNYL  1 patch Transdermal Q72H   free water  400 mL Per Tube Q8H   heparin injection (subcutaneous)  5,000 Units Subcutaneous Q8H   insulin aspart  0-15 Units Subcutaneous Q4H   insulin aspart  2 Units Subcutaneous Q4H   nutrition supplement (JUVEN)  1 packet Per Tube BID BM   mouth rinse  15 mL Mouth Rinse 4 times per day   pantoprazole sodium  40 mg Per Tube BID   polyethylene glycol  17 g Per Tube Daily   sodium chloride flush  10-40 mL Intracatheter Q12H   Continuous Infusions:  sodium chloride 250 mL (08/15/21 0945)    ceFAZolin (ANCEF) IV 2 g (08/23/21 1031)   feeding  supplement (GLUCERNA 1.5 CAL) 1,000 mL (08/22/21 2155)   PRN Meds:.acetaminophen, hydrALAZINE, HYDROmorphone (DILAUDID) injection, ipratropium-albuterol, mouth rinse, mouth rinse, oxyCODONE, sodium chloride flush, white petrolatum   I have personally reviewed following labs and imaging studies  LABORATORY DATA: CBC: Recent Labs  Lab 08/18/21 0415 08/19/21 0312 08/20/21 0500 08/21/21 0333 08/23/21 0406  WBC 11.8* 9.3 9.7 7.1 7.6  HGB 7.0* 5.9* 8.6* 8.6* 8.4*  HCT 21.0* 17.9* 26.0* 26.3* 25.7*  MCV 90.5 91.3 89.3 90.7 90.5  PLT 130* 123* 136* 141* 165     Basic Metabolic Panel: Recent Labs  Lab 08/17/21 0401 08/18/21 0415 08/19/21 1050 08/20/21 0500 08/21/21 0333 08/23/21 0847  NA 147* 146* 141 138 135 131*  K 3.7 3.7 3.5 3.6 3.9 4.7  CL 115* 114* 108 108 104 99  CO2 '23 23 23 24 23 22  '$ GLUCOSE 144* 215* 175* 99 267* 146*  BUN 139* 132* 133* 124* 135* 165*  CREATININE 1.54* 1.50* 1.67* 1.62* 1.77* 2.31*  CALCIUM 9.7 9.6 9.5 9.4 9.2 9.4  MG 2.0  --   --   --   --   --   PHOS  --  2.1* 2.1* 2.4* 2.8 3.5     GFR: Estimated Creatinine Clearance: 22.7 mL/min (A) (by C-G formula based on SCr of 2.31 mg/dL (H)).  Liver Function Tests: Recent Labs  Lab 08/17/21 0401 08/18/21 0415 08/19/21 1050 08/20/21 0500 08/21/21 0333 08/23/21 0847  AST 36  --   --   --   --   --   ALT 52*  --   --   --   --   --   ALKPHOS 112  --   --   --   --   --   BILITOT 1.2  --   --   --   --   --   PROT 5.9*  --   --   --   --   --   ALBUMIN 2.5* 2.7* 2.9* 3.0* 2.5* 2.0*    No results for input(s): "LIPASE", "AMYLASE" in the last 168 hours. No results for input(s): "AMMONIA" in the last 168 hours.  Coagulation Profile: No results for input(s): "INR", "PROTIME" in the last 168 hours.  Cardiac Enzymes: No results for input(s): "CKTOTAL", "CKMB", "CKMBINDEX", "TROPONINI" in the last  168 hours.  BNP (last 3 results) No results for input(s): "PROBNP" in the last 8760  hours.  Lipid Profile: No results for input(s): "CHOL", "HDL", "LDLCALC", "TRIG", "CHOLHDL", "LDLDIRECT" in the last 72 hours.  Thyroid Function Tests: No results for input(s): "TSH", "T4TOTAL", "FREET4", "T3FREE", "THYROIDAB" in the last 72 hours.  Anemia Panel: No results for input(s): "VITAMINB12", "FOLATE", "FERRITIN", "TIBC", "IRON", "RETICCTPCT" in the last 72 hours.  Urine analysis:    Component Value Date/Time   COLORURINE AMBER (A) 08/15/2021 1550   APPEARANCEUR TURBID (A) 08/15/2021 1550   LABSPEC 1.014 08/15/2021 1550   LABSPEC 1.020 10/12/2018 1055   PHURINE 5.0 08/15/2021 1550   GLUCOSEU NEGATIVE 08/15/2021 1550   HGBUR NEGATIVE 08/15/2021 1550   BILIRUBINUR NEGATIVE 08/15/2021 1550   BILIRUBINUR negative 10/12/2018 1055   BILIRUBINUR n 07/01/2016 1139   KETONESUR NEGATIVE 08/15/2021 1550   PROTEINUR 100 (A) 08/15/2021 1550   UROBILINOGEN negative (A) 07/01/2016 1139   NITRITE NEGATIVE 08/15/2021 1550   LEUKOCYTESUR LARGE (A) 08/15/2021 1550    Sepsis Labs: Lactic Acid, Venous    Component Value Date/Time   LATICACIDVEN 1.4 08/09/2021 0401    MICROBIOLOGY: No results found for this or any previous visit (from the past 240 hour(s)).  RADIOLOGY STUDIES/RESULTS: No results found.   LOS: 19 days   Oren Binet, MD  [facility]    To contact the attending provider between 7A-7P or the covering provider during after hours 7P-7A, please log into the web site [email or web address] and access using universal [place] password for that web site. If you do not have the password, please call the hospital operator.  08/23/2021, 11:32 AM

## 2021-08-23 NOTE — Evaluation (Signed)
Clinical/Bedside Swallow Evaluation Patient Details  Name: Courtney Goodman MRN: [id number] Date of Birth: 08/10/1945  Today's Date: 08/23/2021 Time: SLP Start Time (ACUTE ONLY): 1109 SLP Stop Time (ACUTE ONLY): 1127 SLP Time Calculation (min) (ACUTE ONLY): 18 min  Past Medical History:  Past Medical History:  Diagnosis Date   Anemia of chronic disease    Asthma    Atherosclerotic heart disease of native coronary artery without angina pectoris    CKD (chronic kidney disease)    COPD (chronic obstructive pulmonary disease) (HCC)    DM (diabetes mellitus), type 2 with renal complications (HCC)    Dysrhythmia    Elevated ferritin 01/03/2017   GAD (generalized anxiety disorder)    Hypertension    Mixed hyperlipidemia due to type 2 diabetes mellitus (HCC)    Mixed incontinence    per medical records from [facility]   Osteopenia    Personal history of noncompliance with medical treatment, presenting hazards to health    Shock ([place])    Shortness of breath dyspnea    Suicidal ideation    TB (tuberculosis), treated    age 76   Vitamin D deficiency    Past Surgical History:  Past Surgical History:  Procedure Laterality Date   ANKLE RECONSTRUCTION     right   CARPAL TUNNEL RELEASE     COLONOSCOPY  02/2006   Dr April Manson in [place].  for FOBT +.  non-bleeding int hemorrhoids.  bx of "plump" appearing IC valve.   CORONARY ANGIOPLASTY     HPI:  Pt is a 76 y/o female who presented with AMS and new left facial droop. MRI brain 7/9: A few small acute infarcts in left frontoparietal cortex. Pt intubated in ED; ETT 7/8-7/18. Cortrak placed 7/10. EEGs 7/11 and 7/13 negative for seizures. Pt admitted with acute hypoxic respiratory failure and septic shock due to Klebsiella bacteremia-found to have epidural abscesses (not a surgical candidate).  Pt had a prolonged ICU stay complicated by severe encephalopathy, AKI-stabilized. Acute metabolic encephalopathy thought to be due to AKI with  significantly elevated BUN, ICU delirium, sepsis/hypoxemia superimposed on dementia. Palliative care consulted; family would like full scope and understands poor prognosis. PMH: DM-2, HTN, HLD, CVA, anxiety/depression, dementia. Pt seen by SLP in May, 2023 and was on a dysphagia 2 and thin liquids at that time.    Assessment / Plan / Recommendation  Clinical Impression  Pt was seen for bedside swallow evaluation. Pt was alert, but did not follow commands or communicate verbally. Pt moaned frequently throughout the evaluation, but this improved slightly with provision of time and reassurance. A complete oral mechanism could not be conducted due to pt's difficulty following commands; however, oral inspection revealed adequate dentition, and a dry oral mucosa with dried secretions. Pt was resistant to oral care, so it was limited. Pt exhibited symptoms of oropharyngeal dysphagia which likely impacted by pt's mentation. Despite prompts and cues, no bolus manipulation was noted with ice chips/purees, and these boluses were removed via suction. She demonstrated swallowing with thin liquids via straw pipette and cup; however, multiple swallows were noted as well as inconsistent throat clearing and delayed coughing. Pt's risk for dysphagia-related adverse events is judged to be high at this time. Palliative medicine has been involved and pt's family currently wishes full scope of care and intends to proceed with G-tube placement. SLP will follow for dysphagia treatment. SLP Visit Diagnosis: Dysphagia, unspecified (R13.10)    Aspiration Risk  Severe aspiration risk;Risk for inadequate nutrition/hydration  Diet Recommendation NPO;Alternative means - long-term   Medication Administration: Via alternative means    Other  Recommendations Oral Care Recommendations: Oral care QID    Recommendations for follow up therapy are one component of a multi-disciplinary discharge planning process, led by the attending  physician.  Recommendations may be updated based on patient status, additional functional criteria and insurance authorization.  Follow up Recommendations Skilled nursing-short term rehab (<3 hours/day)      Assistance Recommended at Discharge Frequent or constant Supervision/Assistance  Functional Status Assessment Patient has had a recent decline in their functional status and demonstrates the ability to make significant improvements in function in a reasonable and predictable amount of time.  Frequency and Duration min 2x/week  2 weeks       Prognosis Prognosis for Safe Diet Advancement: Fair (fair to guarded) Barriers to Reach Goals: Severity of deficits;Time post onset      Swallow Study   General Date of Onset: 08/14/21 HPI: Pt is a 76 y/o female who presented with AMS and new left facial droop. MRI brain 7/9: A few small acute infarcts in left frontoparietal cortex. Pt intubated in ED; ETT 7/8-7/18. Cortrak placed 7/10. EEGs 7/11 and 7/13 negative for seizures. Pt admitted with acute hypoxic respiratory failure and septic shock due to Klebsiella bacteremia-found to have epidural abscesses (not a surgical candidate).  Pt had a prolonged ICU stay complicated by severe encephalopathy, AKI-stabilized. Acute metabolic encephalopathy thought to be due to AKI with significantly elevated BUN, ICU delirium, sepsis/hypoxemia superimposed on dementia. Palliative care consulted; family would like full scope and understands poor prognosis. PMH: DM-2, HTN, HLD, CVA, anxiety/depression, dementia. Pt seen by SLP in May, 2023 and was on a dysphagia 2 and thin liquids at that time. Type of Study: Bedside Swallow Evaluation Previous Swallow Assessment: none Diet Prior to this Study: NPO;NG Tube Temperature Spikes Noted: No Respiratory Status: Room air History of Recent Intubation: Yes Length of Intubations (days): 10 days Date extubated: 08/14/21 Behavior/Cognition: Alert;Confused;Doesn't follow  directions;Requires cueing Oral Cavity Assessment: Dry;Dried secretions Oral Care Completed by SLP: Yes Oral Cavity - Dentition: Adequate natural dentition Self-Feeding Abilities: Total assist Patient Positioning: Upright in bed;Postural control adequate for testing Baseline Vocal Quality: Normal Volitional Cough: Cognitively unable to elicit Volitional Swallow: Unable to elicit    Oral/Motor/Sensory Function Overall Oral Motor/Sensory Function:  (UTA)   Ice Chips Ice chips: Impaired Presentation: Spoon Oral Phase Impairments: Poor awareness of bolus Oral Phase Functional Implications: Oral holding   Thin Liquid Thin Liquid: Impaired Presentation: Spoon (straw pipette) Oral Phase Impairments: Poor awareness of bolus Pharyngeal  Phase Impairments: Multiple swallows;Throat Clearing - Immediate;Cough - Delayed    Nectar Thick Nectar Thick Liquid: Not tested   Honey Thick Honey Thick Liquid: Not tested   Puree Puree: Impaired Presentation: Spoon Oral Phase Impairments: Poor awareness of bolus Oral Phase Functional Implications:  (absent bolus manipulation)   Solid     Solid: Not tested     Shasha Buchbinder I. Hardin Negus, [place], [place] Office number [phone] Pager [phone]  Horton Marshall 08/23/2021,11:43 AM

## 2021-08-24 DIAGNOSIS — G9340 Encephalopathy, unspecified: Secondary | ICD-10-CM | POA: Diagnosis not present

## 2021-08-24 DIAGNOSIS — J9601 Acute respiratory failure with hypoxia: Secondary | ICD-10-CM | POA: Diagnosis not present

## 2021-08-24 DIAGNOSIS — A414 Sepsis due to anaerobes: Secondary | ICD-10-CM | POA: Diagnosis not present

## 2021-08-24 DIAGNOSIS — R7881 Bacteremia: Secondary | ICD-10-CM | POA: Diagnosis not present

## 2021-08-24 DIAGNOSIS — E440 Moderate protein-calorie malnutrition: Secondary | ICD-10-CM

## 2021-08-24 LAB — COMPREHENSIVE METABOLIC PANEL
ALT: <5 U/L (ref 0–44)
AST: 21 U/L (ref 15–41)
Albumin: 2.6 g/dL — ABNORMAL LOW (ref 3.5–5.0)
Alkaline Phosphatase: 113 U/L (ref 38–126)
Anion gap: 10 (ref 5–15)
BUN: 176 mg/dL — ABNORMAL HIGH (ref 8–23)
CO2: 22 mmol/L (ref 22–32)
Calcium: 9.7 mg/dL (ref 8.9–10.3)
Chloride: 97 mmol/L — ABNORMAL LOW (ref 98–111)
Creatinine, Ser: 2.46 mg/dL — ABNORMAL HIGH (ref 0.44–1.00)
GFR, Estimated: 20 mL/min — ABNORMAL LOW (ref >60)
Glucose, Bld: 107 mg/dL — ABNORMAL HIGH (ref 70–99)
Potassium: 4.8 mmol/L (ref 3.5–5.1)
Sodium: 129 mmol/L — ABNORMAL LOW (ref 135–145)
Total Bilirubin: 0.8 mg/dL (ref 0.3–1.2)
Total Protein: 5.9 g/dL — ABNORMAL LOW (ref 6.5–8.1)

## 2021-08-24 LAB — GLUCOSE, CAPILLARY
Glucose-Capillary: 106 mg/dL — ABNORMAL HIGH (ref 70–99)
Glucose-Capillary: 86 mg/dL (ref 70–99)
Glucose-Capillary: 116 mg/dL — ABNORMAL HIGH (ref 70–99)

## 2021-08-24 MED ORDER — GLYCOPYRROLATE 1 MG PO TABS: 1.0000 mg | ORAL_TABLET | ORAL | Status: DC | PRN

## 2021-08-24 MED ORDER — SODIUM CHLORIDE 0.9% FLUSH: 3.0000 mL | INTRAVENOUS | Status: DC | PRN

## 2021-08-24 MED ORDER — HALOPERIDOL LACTATE 2 MG/ML PO CONC
0.5000 mg | ORAL | Status: DC | PRN
  Filled 2021-08-24: qty 5

## 2021-08-24 MED ORDER — GLYCOPYRROLATE 0.2 MG/ML IJ SOLN: 0.4000 mg | INTRAMUSCULAR | Status: DC | PRN

## 2021-08-24 MED ORDER — HYDROMORPHONE BOLUS VIA INFUSION
1.0000 mg | INTRAVENOUS | Status: DC | PRN
  Administered 2021-08-24 (×2): 1 mg via INTRAVENOUS

## 2021-08-24 MED ORDER — LORAZEPAM 2 MG/ML PO CONC: 1.0000 mg | ORAL | Status: DC | PRN

## 2021-08-24 MED ORDER — FREE WATER: 300.0000 mL | Freq: Three times a day (TID) | Status: DC

## 2021-08-24 MED ORDER — ACETAMINOPHEN 650 MG RE SUPP: 650.0000 mg | Freq: Four times a day (QID) | RECTAL | Status: DC | PRN

## 2021-08-24 MED ORDER — NYSTATIN 100000 UNIT/GM EX POWD: Freq: Three times a day (TID) | CUTANEOUS | Status: DC | PRN

## 2021-08-24 MED ORDER — POLYVINYL ALCOHOL 1.4 % OP SOLN: 1.0000 [drp] | Freq: Four times a day (QID) | OPHTHALMIC | Status: DC | PRN

## 2021-08-24 MED ORDER — GLYCOPYRROLATE 0.2 MG/ML IJ SOLN: 0.2000 mg | INTRAMUSCULAR | Status: DC | PRN

## 2021-08-24 MED ORDER — LORAZEPAM 2 MG/ML IJ SOLN: 1.0000 mg | INTRAMUSCULAR | Status: DC | PRN

## 2021-08-24 MED ORDER — LORAZEPAM 1 MG PO TABS: 1.0000 mg | ORAL_TABLET | ORAL | Status: DC | PRN

## 2021-08-24 MED ORDER — ALBUTEROL SULFATE (2.5 MG/3ML) 0.083% IN NEBU: 2.5000 mg | INHALATION_SOLUTION | RESPIRATORY_TRACT | Status: DC | PRN

## 2021-08-24 MED ORDER — ONDANSETRON HCL 4 MG/2ML IJ SOLN: 4.0000 mg | Freq: Four times a day (QID) | INTRAMUSCULAR | Status: DC | PRN

## 2021-08-24 MED ORDER — SODIUM CHLORIDE 0.9% FLUSH
3.0000 mL | Freq: Two times a day (BID) | INTRAVENOUS | Status: DC
  Administered 2021-08-24: 3 mL via INTRAVENOUS

## 2021-08-24 MED ORDER — HALOPERIDOL LACTATE 5 MG/ML IJ SOLN: 0.5000 mg | INTRAMUSCULAR | Status: DC | PRN

## 2021-08-24 MED ORDER — DIPHENHYDRAMINE HCL 50 MG/ML IJ SOLN: 12.5000 mg | INTRAMUSCULAR | Status: DC | PRN

## 2021-08-24 MED ORDER — HALOPERIDOL 0.5 MG PO TABS: 0.5000 mg | ORAL_TABLET | ORAL | Status: DC | PRN

## 2021-08-24 MED ORDER — SODIUM CHLORIDE 0.9 % IV SOLN: 250.0000 mL | INTRAVENOUS | Status: DC | PRN

## 2021-08-24 MED ORDER — BISACODYL 10 MG RE SUPP: 10.0000 mg | Freq: Every day | RECTAL | Status: DC | PRN

## 2021-08-24 MED ORDER — SODIUM CHLORIDE 0.9 % IV SOLN
1.0000 mg/h | INTRAVENOUS | Status: DC
  Administered 2021-08-24: 1 mg/h via INTRAVENOUS
  Filled 2021-08-24: qty 5

## 2021-08-24 MED ORDER — ONDANSETRON 4 MG PO TBDP: 4.0000 mg | ORAL_TABLET | Freq: Four times a day (QID) | ORAL | Status: DC | PRN

## 2021-08-24 MED ORDER — ACETAMINOPHEN 325 MG PO TABS: 650.0000 mg | ORAL_TABLET | Freq: Four times a day (QID) | ORAL | Status: DC | PRN

## 2021-08-24 MED ORDER — SODIUM CHLORIDE 0.9 % IV SOLN: 12.5000 mg | Freq: Four times a day (QID) | INTRAVENOUS | Status: DC | PRN

## 2021-08-24 MED ORDER — BIOTENE DRY MOUTH MT LIQD: 15.0000 mL | OROMUCOSAL | Status: DC | PRN

## 2021-08-24 NOTE — TOC Initial Note (Signed)
Transition of Care [facility]) - Initial/Assessment Note    Patient Details  Name: Courtney Goodman MRN: [id number] Date of Birth: 08-03-1945  Transition of Care [facility]) CM/SW Contact:    Benard Halsted, LCSW Phone Number: 08/24/2021, 2:33 PM  Clinical Narrative:                 CSW received consult from MD for referral to hospice facility. CSW spoke with daughter, [name], at bedside and explained process. She is agreeable to [facility] referral. CSW sent referral to [facility] for review. No beds available today.  Expected Discharge Plan: [place] Barriers to Discharge: Hospice Bed not available   Patient Goals and CMS Choice Patient states their goals for this hospitalization and ongoing recovery are:: Comfort CMS Medicare.gov Compare Post Acute Care list provided to:: Patient Represenative (must comment) Choice offered to / list presented to : Adult Children  Expected Discharge Plan and Services Expected Discharge Plan: [place] In-house Referral: Clinical Social Work, Hospice / [place] Acute Care Choice: Residential Hospice Bed Living arrangements for the past 2 months: Single Family Home                                      Prior Living Arrangements/Services Living arrangements for the past 2 months: Single Family Home   Patient language and need for interpreter reviewed:: Yes Do you feel safe going back to the place where you live?: Yes      Need for Family Participation in Patient Care: Yes (Comment) Care giver support system in place?: Yes (comment) Current home services: DME Criminal Activity/Legal Involvement Pertinent to Current Situation/Hospitalization: No - Comment as needed  Activities of Daily Living Home Assistive Devices/Equipment: None ADL Screening (condition at time of admission) Patient's cognitive ability adequate to safely complete daily activities?: Yes Is the patient deaf or have difficulty  hearing?: No Does the patient have difficulty seeing, even when wearing glasses/contacts?: No Does the patient have difficulty concentrating, remembering, or making decisions?: Yes Patient able to express need for assistance with ADLs?: No Does the patient have difficulty dressing or bathing?: Yes Independently performs ADLs?: No Communication: Needs assistance Is this a change from baseline?: Pre-admission baseline Dressing (OT): Dependent Is this a change from baseline?: Pre-admission baseline Grooming: Dependent Is this a change from baseline?: Pre-admission baseline Feeding: Dependent Is this a change from baseline?: Pre-admission baseline Bathing: Dependent Is this a change from baseline?: Pre-admission baseline Toileting: Dependent Is this a change from baseline?: Pre-admission baseline In/Out Bed: Dependent Is this a change from baseline?: Pre-admission baseline Walks in Home: Dependent Is this a change from baseline?: Pre-admission baseline Does the patient have difficulty walking or climbing stairs?: Yes Weakness of Legs: Both Weakness of Arms/Hands: Both  Permission Sought/Granted Permission sought to share information with : Facility Sport and exercise psychologist, Family Supports Permission granted to share information with : No  Share Information with NAME: [name]     Permission granted to share info w Relationship: Daughter  Permission granted to share info w Contact Information: [phone]  Emotional Assessment Appearance:: Appears stated age Attitude/Demeanor/Rapport: Unable to Assess Affect (typically observed): Unable to Assess   Alcohol / Substance Use: Not Applicable Psych Involvement: No (comment)  Admission diagnosis:  Fecal impaction ([place]) [K56.41] Shock ([place]) [R57.9] Altered mental status [R41.82] Urinary tract infection with hematuria, site unspecified [N39.0, R31.9] Glasgow coma scale total score  3-8, at arrival to emergency department [facility])  [R40.2432] Sepsis with acute organ dysfunction and septic shock, due to unspecified organism, unspecified type ([place]) [A41.9, R65.21] Dementia, unspecified dementia severity, unspecified dementia type, unspecified whether behavioral, psychotic, or mood disturbance or anxiety ([place]) [F03.90] Patient Active Problem List   Diagnosis Date Noted   Osteomyelitis of vertebra, multiple sites in spine ([place]) 08/10/2021   Bacteremia due to Gram-negative bacteria 08/10/2021   Encephalopathy acute due to sepsis 08/10/2021   Acute respiratory failure ([place]) 08/10/2021   Malnutrition of moderate degree 08/07/2021   Pneumonia due to infectious organism    Pressure injury of skin 08/05/2021   Altered mental status 08/21/2021   Vascular dementia without behavioral disturbance, psychotic disturbance, mood disturbance, or anxiety ([place]) 08/01/2021   Klebsiella pneumoniae sepsis ([place]) 06/03/2021   Pancreatic mass 06/03/2021   Protein-calorie malnutrition, severe 06/02/2021   Sepsis ([place]) 05/31/2021   Major neurocognitive disorder ([place]) 02/19/2021   Dizziness 02/03/2021   Memory difficulties 01/20/2021   Abnormal thyroid exam 09/19/2020   Weight loss 09/12/2020   Uncontrolled type 2 diabetes mellitus with hyperglycemia, with long-term current use of insulin ([place]) 09/12/2020   CKD (chronic kidney disease), stage IV ([place]) 09/12/2020   Hypercalcemia 06/18/2020   Multiple comorbid conditions 06/18/2020   AMS (altered mental status) 06/16/2020   Hyponatremia 06/16/2020   Poor appetite 09/29/2019   Assistance needed with transportation 09/29/2019   Medication noncompliance due to cognitive impairment 10/12/2018   Severe episode of recurrent major depressive disorder, without psychotic features ([place]) 10/12/2018   Acute renal failure superimposed on stage 4 chronic kidney disease ([place]) 09/22/2018   Leukocytosis 09/22/2018   Non-intractable vomiting    GERD (gastroesophageal reflux disease) 08/23/2017   CAD  (coronary artery disease) 08/23/2017   Hypoglycemia 35/46/5681   Acute metabolic encephalopathy 27/51/7001   Hypokalemia 08/23/2017   Type II diabetes mellitus with renal manifestations ([place]) 08/23/2017   Elevated ferritin 01/03/2017   Personal history of noncompliance with medical treatment, presenting hazards to health    Urinary incontinence, mixed 05/27/2016   Major depression, recurrent ([place]) 05/27/2016   Atherosclerotic heart disease of native coronary artery without angina pectoris 05/27/2016   GAD (generalized anxiety disorder)    Anemia due to chronic kidney disease    Mixed hyperlipidemia due to type 2 diabetes mellitus ([place])    Vitamin D deficiency 11/23/2015   Essential hypertension 11/23/2015   Anemia 11/23/2015   CKD (chronic kidney disease), stage III ([place]) 11/23/2015   Dysrhythmia    COPD (chronic obstructive pulmonary disease) ([place])    PCP:  [name], PA-C Pharmacy:   [facility] - [place], [place] - [address] [place] [address] Phone: [phone] Fax: [phone]  [facility] [address] [place] [address] Phone: [phone] Fax: [phone]     Social Determinants of Health (SDOH) Interventions    Readmission Risk Interventions    08/24/2021    2:32 PM  Readmission Risk Prevention Plan  Transportation Screening Complete  Medication Review ([place]) Complete  PCP or Specialist appointment within 3-5 days of discharge Complete  HRI or [place] Complete  SW Recovery Care/Counseling Consult Complete  Palliative Care Screening Complete  [place] Not Applicable

## 2021-08-24 NOTE — Progress Notes (Signed)
PROGRESS NOTE        PATIENT DETAILS Name: Courtney Goodman Age: 76 y.o. Sex: female Date of Birth: January 05, 1946 Admit Date: 08/24/2021 Admitting Physician Chesley Mires, MD VEL:[id number], Mare Ferrari, PA-C  Brief Summary: Patient is a 76 y.o.  female history of DM-2, HTN, HLD, CVA, anxiety/depression, dementia who was admitted to the ICU with acute hypoxic respiratory failure and septic shock due to Klebsiella bacteremia-found to have epidural abscesses (not a surgical candidate).  Had a prolonged ICU stay complicated by severe encephalopathy, AKI-stabilized and subsequently transferred to [facility] service on 7/20.  See below for further details.   Significant events: 7/08>> admit to ICU-septic shock/respiratory failure.  Intubated. 7/10>> Thoracic and lumbar spine: Osteomyelitis/discitis and epidural abscess.  PCCM discussed with neurosurgery-not a surgical candidate. 7/18>> extubated. 7/20>> transferred to [facility].  Rediscussed case with neurosurgery-Joshua Glenford Peers, NP-not a Surgical candidate. 7/25>> nephrology signed off-not a dialysis candidate. 7/27>> worsening renal function-around 400 cc of urine on bladder scan-Foley catheter placed.  SLP reevaluation-not safe for any sort of oral intake due to severe encephalopathy. 7/28>> further worsening of renal function-rediscussed with Dr. Moshe Cipro (nephrology)-not a candidate for long/short-term HD.  Significant studies: 7/08>> CT head: No acute intracranial pathology. 7/08>> CT C-spine: No fracture/subluxation 7/08>> CT abdomen/pelvis: Lytic appearance of superior endplate L1-gas within the disc space concerning for discitis/osteomyelitis.  Previously seen pancreatic head lesion appears resolved. 7/09>> MRI brain: Please a few small acute infarcts in the left frontoparietal cortex. 7/09>> MRI C-spine: No osteomyelitis/discitis. 7/09>> MRI thoracic/lumbar spine: Osteomyelitis/discitis at T12-L1, small epidural collection at  T12 with ventral epidural extension of the T9.  4.4 paraspinous fluid collection at T11, T12 suspicious for abscess. 7/09>> Echo: EF 02-58%, grade 1 diastolic dysfunction. 7/11>> LTM EEG: No seizures. 7/13>> Spot EEG: No seizures. 7/19>> renal ultrasound: No hydronephrosis. 7/27>> renal ultrasound: No hydronephrosis.  Significant microbiology data: 7/8>> COVID/influenza PCR: Negative 7/8>> blood culture: Klebsiella pneumoniae  Procedures: 7/8-7/18>> ETT 7/10>> cortrak placed 7/27>> Foley placed  Consults: PCCM ID Nephrology Neurology Palliative care  Subjective: Moaning/groaning when palpated/examined.  Opens eyes but not following commands.  Objective: Vitals: Blood pressure 108/63, pulse 64, temperature (!) 97.1 F (36.2 C), temperature source Axillary, resp. rate 14, height '5\' 5"'$  (1.651 m), weight 88.1 kg, SpO2 99 %.   Exam: Gen Exam: Confused-moans/groans when examined. HEENT:atraumatic, normocephalic Chest: B/L clear to auscultation anteriorly CVS:S1S2 regular Abdomen:soft non tender, non distended Extremities:no edema Neurology: Difficult exam-withdraws to pain in all 4 extremities Skin: no rash   Pertinent Labs/Radiology:    Latest Ref Rng & Units 08/23/2021    4:06 AM 08/21/2021    3:33 AM 08/20/2021    5:00 AM  CBC  WBC 4.0 - 10.5 K/uL 7.6  7.1  9.7   Hemoglobin 12.0 - 15.0 g/dL 8.4  8.6  8.6   Hematocrit 36.0 - 46.0 % 25.7  26.3  26.0   Platelets 150 - 400 K/uL 165  141  136     Lab Results  Component Value Date   NA 129 (L) 08/24/2021   K 4.8 08/24/2021   CL 97 (L) 08/24/2021   CO2 22 08/24/2021     Assessment/Plan: Septic shock due to Klebsiella pneumonia bacteremia with T12-L1 osteomyelitis epidural abscess/paraspinal collection: Sepsis physiology has resolved-Per neurosurgery-not a candidate for incision/drainage of epidural abscess.  Significantly weak/debilitated but moving all 4 extremities.  On IV Ancef for now-ID recommending lifelong  cefadroxil when a bit more stable.    Acute metabolic encephalopathy: Multifactorial due to AKI with significantly elevated BUN, ICU delirium, sepsis/hypoxemia superimposed on dementia.  Neuroimaging/EEG as above.  Remains confused-BUN remains significantly elevated.    Acute hypoxic respiratory failure due to compromised airway: Extubated on 7/18-currently on room air or on minimal amounts of oxygen.  Continue supportive care.   AKI on CKD stage IIIb: AKI felt to be hemodynamically mediated-BUN continues to be significantly elevated-for the past several days-creatinine slowly creeping up.  There was a question of whether patient had some urinary retention yesterday and Foley catheter was placed.  Discussed with nephrology MD-Dr. Moshe Cipro on 7/28-not a candidate for short-term or long-term HD.  Recommends hospice care.  We will continue goals of care discussion with family.  Continue IV albumin in the interim.    Hyponatremia: Worsening-remains volume overloaded/with significant third spacing.  Decrease free water via NG tube.  Recheck electrolytes tomorrow.  Hypernatremia: Resolved with free water via NG tube.  Anasarca/volume overload: Due to hypoalbuminemia-back on IV albumin-getting free water through NG tube.  Avoiding Lasix due to worsening renal function today.  Dysphagia: Due to critical illness-encephalopathy-severe muscular deconditioning-reevaluated by SLP on 7/27-not yet safe for any oral intake.  Cortrak tube remains in place.  Given worsening AKI-holding off on PEG tube placement-have reconsulted palliative care for ongoing goals of care discussion  Anemia due to critical illness/CKD: Hemoglobin remains stable-transfuse if significant drop in hemoglobin.  Thrombocytopenia: Due to sepsis-resolved.  Acute CVA: Mostly incidental finding on MRI-Per last neurology note on 7/15-further work-up can be considered if her clinical/lab situation improves.  COPD: Not in  exacerbation-continue bronchodilators.  DM-2: CBG stable with SSI.  Recent Labs    08/23/21 2332 08/24/21 0404 08/24/21 0800  GLUCAP 120* 106* 86     HTN: BP better-continue Coreg.  Follow/optimize.  Anxiety/depression: Currenttly encephalopathic-minimize sedatives at this point.  Will need to resume Lexapro when she is a bit more stable.  Dementia: See above regarding encephalopathy.  Severe debility/deconditioning: Continue PT/OT is much as possible.  Goals of care/palliative care: Long hospitalization-see above regarding medical issues-unfortunately still severely encephalopathic-worsening renal function-not a candidate for hemodialysis.  Palliative care reconsulted on 7/27 for ongoing goals of care discussion.  I had another long discussion with the patient's daughter-[name] over the phone on 7/28-explained that her kidneys continue to worsen-and that we do not have any further therapies to reverse her underlying multiple issues.  Strongly recommended that we consider hospice care.  [name] asked me if we could continue NG tube feedings if patient went to hospice-I explained that NG tube feedings was not helping in reversing her underlying issues, suspect that the tube in her nose is also causing her to be in some discomfort as well because she keeps on moaning and groaning.  We talked about DNR status-and after extensive discussion-[name] was okay with me putting a DNR order in.  I will go ahead and consult social work-and touch base with palliative care and work with family to see if we can place this patient at residential hospice/[facility].  Nutrition Status: Nutrition Problem: Moderate Malnutrition Etiology: chronic illness Signs/Symptoms: moderate fat depletion, moderate muscle depletion Interventions: Juven, Tube feeding, Prostat   Pressure Ulcer: Pressure Injury 08/01/2021 Coccyx Right;Mid;Left Unstageable - Full thickness tissue loss in which the base of the injury is covered by  slough (yellow, tan, gray, green or brown) and/or eschar (tan, brown or black)  in the wound bed. eschar (Active)  08/24/2021 1730  Location: Coccyx  Location Orientation: Right;Mid;Left  Staging: Unstageable - Full thickness tissue loss in which the base of the injury is covered by slough (yellow, tan, gray, green or brown) and/or eschar (tan, brown or black) in the wound bed.  Wound Description (Comments): eschar  Present on Admission:   Dressing Type Foam - Lift dressing to assess site every shift;Gauze (Comment);Impregnated gauze (petrolatum) 08/24/21 0530    BMI: Estimated body mass index is 32.32 kg/m as calculated from the following:   Height as of this encounter: '5\' 5"'$  (1.651 m).   Weight as of this encounter: 88.1 kg.   Code status:   Code Status: Full Code   DVT Prophylaxis: heparin injection 5,000 Units Start: 08/12/21 1400 Place and maintain sequential compression device Start: 07/29/2021 1510   Family Communication: Daughter-[name]-[phone] updated over the phone on 7/28.   Disposition Plan: Status is: Inpatient Remains inpatient appropriate because: Septic shock-Klebsiella bacteremia-epidural abscess/osteomyelitis-severe encephalopathy-remains n.p.o.-NG tube in place.  Not yet stable for discharge.   Planned Discharge Destination:Skilled nursing facility versus LTACH.-Denied by insurance to go to [facility] primarily due to NG tube in place.   Diet: Diet Order             Diet NPO time specified  Diet effective now                     Antimicrobial agents: Anti-infectives (From admission, onward)    Start     Dose/Rate Route Frequency Ordered Stop   08/17/21 1200  cefTRIAXone (ROCEPHIN) 2 g in sodium chloride 0.9 % 100 mL IVPB  Status:  Discontinued        2 g 200 mL/hr over 30 Minutes Intravenous Every 12 hours 08/17/21 1046 08/17/21 1058   08/17/21 1145  ceFAZolin (ANCEF) IVPB 2g/100 mL premix        2 g 200 mL/hr over 30 Minutes Intravenous Every 12  hours 08/17/21 1058     08/16/21 2200  cefadroxil (DURICEF) capsule 500 mg  Status:  Discontinued        500 mg Oral 2 times daily 08/16/21 0917 08/17/21 1046   08/06/21 1000  cefTRIAXone (ROCEPHIN) 2 g in sodium chloride 0.9 % 100 mL IVPB  Status:  Discontinued        2 g 200 mL/hr over 30 Minutes Intravenous Every 12 hours 08/06/21 0757 08/16/21 0917   08/05/21 1400  ceFEPIme (MAXIPIME) 1 g in sodium chloride 0.9 % 100 mL IVPB  Status:  Discontinued        1 g 200 mL/hr over 30 Minutes Intravenous Every 24 hours 08/14/2021 1302 08/05/21 0752   08/05/21 1400  ceFEPIme (MAXIPIME) 2 g in sodium chloride 0.9 % 100 mL IVPB  Status:  Discontinued        2 g 200 mL/hr over 30 Minutes Intravenous Every 24 hours 08/05/21 0752 08/05/21 1048   08/05/21 1200  ceFEPIme (MAXIPIME) 2 g in sodium chloride 0.9 % 100 mL IVPB  Status:  Discontinued        2 g 200 mL/hr over 30 Minutes Intravenous Every 24 hours 08/05/21 1048 08/06/21 0757   08/22/2021 1945  vancomycin (VANCOREADY) IVPB 1250 mg/250 mL        1,250 mg 166.7 mL/hr over 90 Minutes Intravenous  Once 08/03/2021 1851 08/05/21 1045   08/02/2021 1315  vancomycin (VANCOREADY) IVPB 1250 mg/250 mL  Status:  Discontinued  1,250 mg 166.7 mL/hr over 90 Minutes Intravenous  Once 07/28/2021 1302 08/19/2021 1851   08/19/2021 1301  vancomycin variable dose per unstable renal function (pharmacist dosing)  Status:  Discontinued         Does not apply See admin instructions 08/23/2021 1302 08/05/21 1125   08/05/2021 1300  ceFEPIme (MAXIPIME) 2 g in sodium chloride 0.9 % 100 mL IVPB        2 g 200 mL/hr over 30 Minutes Intravenous  Once 08/23/2021 1252 08/05/21 0255   08/02/2021 1300  metroNIDAZOLE (FLAGYL) IVPB 500 mg  Status:  Discontinued        500 mg 100 mL/hr over 60 Minutes Intravenous  Once 08/11/2021 1252 08/05/21 1125   08/01/2021 1300  vancomycin (VANCOCIN) IVPB 1000 mg/200 mL premix  Status:  Discontinued        1,000 mg 200 mL/hr over 60 Minutes Intravenous  Once  08/07/2021 1252 08/14/2021 1302        MEDICATIONS: Scheduled Meds:  carvedilol  12.5 mg Per Tube BID WC   Chlorhexidine Gluconate Cloth  6 each Topical Daily   darbepoetin (ARANESP) injection - NON-DIALYSIS  60 mcg Subcutaneous Q Thu-1800   docusate  100 mg Per Tube BID   feeding supplement (PROSource TF)  45 mL Per Tube BID   fentaNYL  1 patch Transdermal Q72H   free water  300 mL Per Tube Q8H   heparin injection (subcutaneous)  5,000 Units Subcutaneous Q8H   insulin aspart  0-15 Units Subcutaneous Q4H   insulin aspart  2 Units Subcutaneous Q4H   nutrition supplement (JUVEN)  1 packet Per Tube BID BM   mouth rinse  15 mL Mouth Rinse 4 times per day   pantoprazole sodium  40 mg Per Tube BID   polyethylene glycol  17 g Per Tube Daily   sodium chloride flush  10-40 mL Intracatheter Q12H   Continuous Infusions:  sodium chloride 250 mL (08/15/21 0945)   albumin human 25 g (08/23/21 1716)    ceFAZolin (ANCEF) IV 2 g (08/24/21 0906)   feeding supplement (GLUCERNA 1.5 CAL) 1,000 mL (08/23/21 2215)   PRN Meds:.acetaminophen, hydrALAZINE, HYDROmorphone (DILAUDID) injection, ipratropium-albuterol, mouth rinse, mouth rinse, oxyCODONE, sodium chloride flush, white petrolatum   I have personally reviewed following labs and imaging studies  LABORATORY DATA: CBC: Recent Labs  Lab 08/18/21 0415 08/19/21 0312 08/20/21 0500 08/21/21 0333 08/23/21 0406  WBC 11.8* 9.3 9.7 7.1 7.6  HGB 7.0* 5.9* 8.6* 8.6* 8.4*  HCT 21.0* 17.9* 26.0* 26.3* 25.7*  MCV 90.5 91.3 89.3 90.7 90.5  PLT 130* 123* 136* 141* 165     Basic Metabolic Panel: Recent Labs  Lab 08/18/21 0415 08/19/21 1050 08/20/21 0500 08/21/21 0333 08/23/21 0847 08/24/21 0329  NA 146* 141 138 135 131* 129*  K 3.7 3.5 3.6 3.9 4.7 4.8  CL 114* 108 108 104 99 97*  CO2 '23 23 24 23 22 22  '$ GLUCOSE 215* 175* 99 267* 146* 107*  BUN 132* 133* 124* 135* 165* 176*  CREATININE 1.50* 1.67* 1.62* 1.77* 2.31* 2.46*  CALCIUM 9.6 9.5 9.4  9.2 9.4 9.7  PHOS 2.1* 2.1* 2.4* 2.8 3.5  --      GFR: Estimated Creatinine Clearance: 21.3 mL/min (A) (by C-G formula based on SCr of 2.46 mg/dL (H)).  Liver Function Tests: Recent Labs  Lab 08/19/21 1050 08/20/21 0500 08/21/21 0333 08/23/21 0847 08/24/21 0329  AST  --   --   --   --  21  ALT  --   --   --   --  <5  ALKPHOS  --   --   --   --  113  BILITOT  --   --   --   --  0.8  PROT  --   --   --   --  5.9*  ALBUMIN 2.9* 3.0* 2.5* 2.0* 2.6*    No results for input(s): "LIPASE", "AMYLASE" in the last 168 hours. No results for input(s): "AMMONIA" in the last 168 hours.  Coagulation Profile: No results for input(s): "INR", "PROTIME" in the last 168 hours.  Cardiac Enzymes: No results for input(s): "CKTOTAL", "CKMB", "CKMBINDEX", "TROPONINI" in the last 168 hours.  BNP (last 3 results) No results for input(s): "PROBNP" in the last 8760 hours.  Lipid Profile: No results for input(s): "CHOL", "HDL", "LDLCALC", "TRIG", "CHOLHDL", "LDLDIRECT" in the last 72 hours.  Thyroid Function Tests: No results for input(s): "TSH", "T4TOTAL", "FREET4", "T3FREE", "THYROIDAB" in the last 72 hours.  Anemia Panel: No results for input(s): "VITAMINB12", "FOLATE", "FERRITIN", "TIBC", "IRON", "RETICCTPCT" in the last 72 hours.  Urine analysis:    Component Value Date/Time   COLORURINE AMBER (A) 08/23/2021 1800   APPEARANCEUR TURBID (A) 08/23/2021 1800   LABSPEC 1.010 08/23/2021 1800   LABSPEC 1.020 10/12/2018 1055   PHURINE 5.0 08/23/2021 1800   GLUCOSEU 50 (A) 08/23/2021 1800   HGBUR SMALL (A) 08/23/2021 1800   BILIRUBINUR NEGATIVE 08/23/2021 1800   BILIRUBINUR negative 10/12/2018 1055   BILIRUBINUR n 07/01/2016 1139   KETONESUR NEGATIVE 08/23/2021 1800   PROTEINUR 100 (A) 08/23/2021 1800   UROBILINOGEN negative (A) 07/01/2016 1139   NITRITE NEGATIVE 08/23/2021 1800   LEUKOCYTESUR LARGE (A) 08/23/2021 1800    Sepsis Labs: Lactic Acid, Venous    Component Value Date/Time    LATICACIDVEN 1.4 08/09/2021 0401    MICROBIOLOGY: No results found for this or any previous visit (from the past 240 hour(s)).  RADIOLOGY STUDIES/RESULTS: US RENAL  Result Date: 08/24/2021 CLINICAL DATA:  Acute kidney injury EXAM: RENAL / URINARY TRACT ULTRASOUND COMPLETE COMPARISON:  08/15/2021 FINDINGS: Right Kidney: Renal measurements: 10.0 x 5.4 x 4.1 cm = volume: 116 mL. Echogenic renal parenchyma. No mass or hydronephrosis visualized. Left Kidney: Renal measurements: 10.5 x 5.4 x 4.9 cm = volume: 145 mL. Echogenic renal parenchyma. No mass or hydronephrosis visualized. Bladder: Decompressed by an indwelling Foley catheter. Other: Abdominal ascites. IMPRESSION: Echogenic renal parenchyma, suggesting medical renal disease. No hydronephrosis. Bladder decompressed by an indwelling Foley catheter. Electronically Signed   By: Julian Hy M.D.   On: 08/24/2021 00:05     LOS: 20 days   Oren Binet, MD  [facility]    To contact the attending provider between 7A-7P or the covering provider during after hours 7P-7A, please log into the web site [email or web address] and access using universal [place] password for that web site. If you do not have the password, please call the hospital operator.  08/24/2021, 11:22 AM

## 2021-08-24 NOTE — Progress Notes (Signed)
Manufacturing engineer [facility]) Hospital Liaison Note  Referral received for patient/family interest in [facility].   Bedside assessment will take place tomorrow at 10am with family present.   Please call with any questions or concerns. Thank you  Roselee Nova, [place] Hospital Liaison [phone]

## 2021-08-24 NOTE — Progress Notes (Signed)
Palliative care met with family earlier-I subsequently met with daughter Courtney Goodman), granddaughter and other family members.  Explained what comfort care would be like-mostly symptom control-stopping NG tube feedings, IV antibiotics and other supportive care.  We will keep Foley/PICC line in for comfort.  After extensive discussion-family members were willing to transition to full comfort care-social work consulted for residential hospice placement.

## 2021-08-24 NOTE — Progress Notes (Signed)
Daily Progress Note   Patient Name: Courtney Goodman       Date: 08/24/2021 DOB: 07-03-1945  Age: 76 y.o. MRN#: [id number] Attending Physician: Jonetta Osgood, MD Primary Care Physician: [name] Admit Date: 08/19/2021  Reason for Consultation/Follow-up: Establishing goals of care  Subjective: Medical records reviewed including progress notes, labs, imaging. Discussed with Dr. Sloan Leiter. Patient assessed at the bedside. She appears uncomfortable, RN administering PRN pain medication. Patient's daughter [name], granddaughter, and son-in-law are present visiting. Family is understandably emotional given worsening renal function and lack of further options despite ongoing medical interventions.  Created space and opportunity for family's thoughts and feelings on patient's current illness. They are concerned about discontinuing NGT and artificial nutritional. Overall, goals of care are for comfort, peace, and dignity at end of life. Counseled on the lack of benefit from artificial nutritional at this point in the disease trajectory, emphasizing that this often causes more harm than benefit as patients have difficulty tolerating large quantities of tube feedings. Advised on the appropriateness of comfort feedings for taste and pleasure if patients requests bites and sips.   Discussed comfort care; patient would no longer receive aggressive medical interventions such as continuous vital signs, lab work, radiology testing, or medications not focused on comfort. All care would focus on how the patient is looking and feeling. This would include management of any symptoms that may cause discomfort, pain, shortness of breath, cough, nausea, agitation, anxiety, and/or secretions etc. Symptoms would be  managed with medications and other non-pharmacological interventions such as spiritual support if requested, repositioning, music therapy, or therapeutic listening. Family verbalized understanding and appreciation. They are agreeable if Dr. Sloan Leiter recommends this. We also discussed residential hospice reviewing referral process and likely eligibility given severity of patient's illness. Family is agreeable to a referral being placed today.   Questions and concerns addressed. [employer] booklet provided for review. PMT will continue to support holistically.   Length of Stay: 20   Physical Exam Vitals and nursing note reviewed.  Constitutional:      Appearance: She is ill-appearing.     Interventions: Nasal cannula in place.     Comments: Cor-trak in place, appears uncomfortable  Cardiovascular:     Rate and Rhythm: Normal rate.  Pulmonary:     Effort:  Pulmonary effort is normal.  Neurological:     Mental Status: She is unresponsive.             Vital Signs: BP 108/63 (BP Location: Right Arm)   Pulse 64   Temp (!) 97.1 F (36.2 C) (Axillary)   Resp 14   Ht '5\' 5"'$  (1.651 m)   Wt 88.1 kg   SpO2 99%   BMI 32.32 kg/m  SpO2: SpO2: 99 % O2 Device: O2 Device: Room Air O2 Flow Rate: O2 Flow Rate (L/min): 2 L/min      Palliative Assessment/Data: 10%     Palliative Care Assessment & Plan   Patient Profile:   76 y.o. female  admitted on 08/05/2021 through the ER with AMS and new Lt facial droop.  She was in hospital from 05/31/21 to 06/06/21 for AMS, septic shock with Klebsiella bacteremia, hypodense lesion in pancreatic head, and uncontrolled diabetes.  Reported to have fever at home.  Intubated in ER.  Started on ABx and pressors.  Neuro consulted in ER.  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  CT chest/abd/pelvis showed bronchiectasis with mucus plugging and basilar consolidation, improved appearance of pancreatic head, stool balls in rectum, and  increasing lytic lesion in superior endplate wedge deformity of L1 with gas concerning for osteomyelitis. Found to have worsening anemia and PRBC ordered.  EEG and MRI imaging ordered.  Patient admitted for treatment stabilization.  Assessment: Goals of care covnersation Dementia AKI on CKD  Recommendations/Plan: Continue DNR Transition to comfort care today, orders per [facility] TOC consulted for referral to [place] and emotional support provided Patient politely declines spiritual care consult PMT will continue to follow and support   Prognosis:  < 2 weeks  Discharge Planning: Hospice facility  Care plan was discussed with Patient's family, Dr. Sloan Leiter   MDM [place]         Tyrion Glaude Johnnette Litter, PA-C  Palliative Medicine Team Team phone # [phone]  Thank you for allowing the Palliative Medicine Team to assist in the care of this patient. Please utilize secure chat with additional questions, if there is no response within 30 minutes please call the above phone number.  Palliative Medicine Team providers are available by phone from 7am to 7pm daily and can be reached through the team cell phone.  Should this patient require assistance outside of these hours, please call the patient's attending physician.

## 2021-08-28 NOTE — Progress Notes (Signed)
Patient has passed; 2 Rns verified (Agricultural consultant and main Therapist, sports). Notified MD.

## 2021-08-28 NOTE — Death Summary Note (Signed)
DEATH SUMMARY   Patient Details  Name: Courtney Goodman MRN: [id number] DOB: 14-Aug-1945 OEU:MPNTIRWE, Mare Ferrari, PA-C Admission/Discharge Information   Admit Date:  08-31-2021  Date of Death: Date of Death: Sep 21, 2021  Time of Death: Time of Death: 0120  Length of Stay: 15-May-2022   Principle Cause of death: Septic shock due to Klebsiella bacteremia-epidural abscess.  Hospital Diagnoses: Active Problems:   Osteomyelitis of vertebra, multiple sites in spine ([place])   Bacteremia due to Gram-negative bacteria   Acute respiratory failure (HCC)   CKD (chronic kidney disease), stage III (HCC)   Mixed hyperlipidemia due to type 2 diabetes mellitus (HCC)   Klebsiella pneumoniae sepsis (HCC)   Vascular dementia without behavioral disturbance, psychotic disturbance, mood disturbance, or anxiety (HCC)   Pressure injury of skin   Pneumonia due to infectious organism   Malnutrition of moderate degree   Encephalopathy acute due to sepsis   Hospital Course: Patient is a 76 y.o.  female history of DM-2, HTN, HLD, CVA, anxiety/depression, dementia who was admitted to the ICU with acute hypoxic respiratory failure and septic shock due to Klebsiella bacteremia-found to have epidural abscesses (not a surgical candidate).  Had a prolonged ICU stay complicated by severe encephalopathy, AKI-stabilized and subsequently transferred to [facility] service on 7/20.  See below for further details.    Significant events: 7/08>> admit to ICU-septic shock/respiratory failure.  Intubated. 7/10>> Thoracic and lumbar spine: Osteomyelitis/discitis and epidural abscess.  PCCM discussed with neurosurgery-not a surgical candidate. 7/18>> extubated.  Family wanting full scope of treatment. 7/20>> transferred to [facility].  Rediscussed case with neurosurgery-Joshua Glenford Peers, NP-not a Surgical candidate. 7/25>> nephrology signed off-not a dialysis candidate. 7/27>> worsening renal function-around 400 cc of urine on bladder scan-Foley catheter  placed.  SLP reevaluation-not safe for any sort of oral intake due to severe encephalopathy. 7/28>> further worsening of renal function-rediscussed with Dr. Moshe Cipro (nephrology)-not a candidate for long/short-term HD.  After discussion with family-made comfort care.    Significant studies: 7/08>> CT head: No acute intracranial pathology. 7/08>> CT C-spine: No fracture/subluxation September 01, 2022>> CT abdomen/pelvis: Lytic appearance of superior endplate L1-gas within the disc space concerning for discitis/osteomyelitis.  Previously seen pancreatic head lesion appears resolved. 7/09>> MRI brain: Please a few small acute infarcts in the left frontoparietal cortex. 7/09>> MRI C-spine: No osteomyelitis/discitis. 7/09>> MRI thoracic/lumbar spine: Osteomyelitis/discitis at T12-L1, small epidural collection at T12 with ventral epidural extension of the T9.  4.4 paraspinous fluid collection at T11, T12 suspicious for abscess. 7/09>> Echo: EF 31-54%, grade 1 diastolic dysfunction. 7/11>> LTM EEG: No seizures. 7/13>> Spot EEG: No seizures. 7/19>> renal ultrasound: No hydronephrosis. 7/27>> renal ultrasound: No hydronephrosis.   Significant microbiology data: 7/8>> COVID/influenza PCR: Negative 7/8>> blood culture: Klebsiella pneumoniae   Procedures: 7/8-7/18>> ETT 7/10>> cortrak placed 7/27>> Foley placed   Consults: PCCM ID Nephrology Neurology Palliative care  Assessment and Plan: Septic shock due to Klebsiella pneumonia bacteremia with T12-L1 osteomyelitis epidural abscess/paraspinal collection: Sepsis physiology has resolved-Per neurosurgery-not a candidate for incision/drainage of epidural abscess.  Significantly weak/debilitated but moving all 4 extremities.  On IV Ancef for now-ID recommended lifelong cefadroxil when a bit more stable.     Acute metabolic encephalopathy: Multifactorial due to AKI with significantly elevated BUN, ICU delirium, sepsis/hypoxemia superimposed on dementia.   Neuroimaging/EEG as above.  Remained confused-unfortunately-BUN remains significantly elevated due to worsening renal function.   Acute hypoxic respiratory failure due to compromised airway: Extubated on 7/18-was on room air or on minimal amount of oxygen.  AKI on CKD stage IIIb: AKI felt to be hemodynamically mediated-BUN continued to be significantly elevated-managed with free water via PEG tube/IV albumin.  Nephrology followed closely-was not considered a short-term or long-term dialysis candidate.  Unfortunately-renal function continued to worsen-rediscussed with Dr. Moshe Cipro on 7/28-continues to be a poor candidate for HD.  After extensive discussion with family-transitioned to full comfort measures.    Hyponatremia: Worsening-remains volume overloaded/with significant third spacing.  Suspect this was due to hypervolemia in the setting of worsening renal function.  Free water via NG tube was decreased.   Hypernatremia: Resolved with free water via NG tube.   Anasarca/volume overload: Due to hypoalbuminemia-was on intermittent dose of IV albumin.  Did not get diuretics due to significantly worsened renal function.     Dysphagia: Due to critical illness-encephalopathy-severe muscular deconditioning-reevaluated by SLP on 7/27-not yet safe for any oral intake.  Cortrak tube remained in place.  Given worsening AKI-PEG tube was not placed.    Anemia due to critical illness/CKD: Hemoglobin remained stable-plan was to transfuse if significant drop in hemoglobin.   Thrombocytopenia: Due to sepsis-resolved.   Acute CVA: Mostly incidental finding on MRI-Per last neurology note on 7/15-further work-up can be considered if her clinical/lab situation improves.   COPD: Not in exacerbation-was on  bronchodilators.   DM-2: CBG stable with SSI.  HTN: BP was managed with Coreg.   Anxiety/depression: Due to persistent encephalopathy-Lexapro another medications were held.     Dementia: See above  regarding encephalopathy.   Severe debility/deconditioning: Due to critical illness.  Goals of care/palliative care: Long/prolonged hospitalization-initially a full code-but due to continued persistent encephalopathy-worsening renal function-and ongoing discussion with family-made DNR and subsequently transition to full comfort measures on 7/28.  Plans were to transition to residential hospice.  However patient expired earlier this morning before she could be transferred to residential hospice.    The results of significant diagnostics from this hospitalization (including imaging, microbiology, ancillary and laboratory) are listed below for reference.   Significant Diagnostic Studies: US RENAL  Result Date: 08/24/2021 CLINICAL DATA:  Acute kidney injury EXAM: RENAL / URINARY TRACT ULTRASOUND COMPLETE COMPARISON:  08/15/2021 FINDINGS: Right Kidney: Renal measurements: 10.0 x 5.4 x 4.1 cm = volume: 116 mL. Echogenic renal parenchyma. No mass or hydronephrosis visualized. Left Kidney: Renal measurements: 10.5 x 5.4 x 4.9 cm = volume: 145 mL. Echogenic renal parenchyma. No mass or hydronephrosis visualized. Bladder: Decompressed by an indwelling Foley catheter. Other: Abdominal ascites. IMPRESSION: Echogenic renal parenchyma, suggesting medical renal disease. No hydronephrosis. Bladder decompressed by an indwelling Foley catheter. Electronically Signed   By: Julian Hy M.D.   On: 08/24/2021 00:05   US RENAL  Result Date: 08/15/2021 CLINICAL DATA:  Acute kidney injury. EXAM: RENAL / URINARY TRACT ULTRASOUND COMPLETE COMPARISON:  MRI 06/04/2021 FINDINGS: Right Kidney: Renal measurements: 9.9 x 5.5 x 4.9 cm = volume: 137 mL. Slightly increased echogenicity. No mass, stone or hydronephrosis. Left Kidney: Renal measurements: 10.0 x 5.9 x 5.5 cm = volume: 170 mL. Slight increased echogenicity. No mass, stone or hydronephrosis. Bladder: Some debris like material in the bladder. Other: Small amount of  ascites.  Bilateral pleural effusions. IMPRESSION: Normal sized kidneys. No obstruction. Slight increased parenchymal echogenicity consistent with renal parenchymal disease. Bilateral pleural effusions and ascites. Electronically Signed   By: Nelson Chimes M.D.   On: 08/15/2021 16:06   EEG adult  Result Date: 08/09/2021 Lora Havens, MD     08/09/2021 10:38 AM Patient Name: Courtney Goodman MRN:  [id number] Epilepsy Attending: Lora Havens Referring Physician/Provider: Janine Ores, NP Date: 08/09/2021 Duration: 23.59 mins Patient history: 76 year old female with altered mental status. EEG to evaluate for seizure  Level of alertness: lethargic  AEDs during EEG study: None  Technical aspects: This EEG study was done with scalp electrodes positioned according to the 10-20 International system of electrode placement. Electrical activity was acquired at a sampling rate of '500Hz'$  and reviewed with a high frequency filter of '70Hz'$  and a low frequency filter of '1Hz'$ . EEG data were recorded continuously and digitally stored.  Description: No clear posterior dominant rhythm was seen. EEG showed continuous generalized 3 to 7 Hz theta-delta slowing, at times with triphasic morphology. Hyperventilation and photic stimulation were not performed.    ABNORMALITY - Continuous slow, generalized  IMPRESSION: This study is suggestive of moderate to severe diffuse encephalopathy, nonspecific to etiology but likely secondary to toxic, metabolic causes.  No seizures or definite epileptiform discharges were seen throughout the recording.  Priyanka O Yadav   [place] EKG SITE RITE  Result Date: 08/09/2021 If Site Rite image not attached, placement could not be confirmed due to current cardiac rhythm.  [place] EKG SITE RITE  Result Date: 08/08/2021 If Site Rite image not attached, placement could not be confirmed due to current cardiac rhythm.  Overnight EEG with video  Result Date: 08/07/2021 Lora Havens, MD     08/07/2021  10:35 AM Patient Name: Courtney Goodman MRN: [id number] Epilepsy Attending: Lora Havens Referring Physician/Provider: Donnetta Simpers, MD Duration: 08/06/2021 4854 to 08/07/2021 0934 Patient history: 76 year old female with altered mental status. EEG to evaluate for seizure Level of alertness: Awake, asleep AEDs during EEG study: None Technical aspects: This EEG study was done with scalp electrodes positioned according to the 10-20 International system of electrode placement. Electrical activity was acquired at a sampling rate of '500Hz'$  and reviewed with a high frequency filter of '70Hz'$  and a low frequency filter of '1Hz'$ . EEG data were recorded continuously and digitally stored. Description: No clear posterior dominant rhythm was seen. Sleep was characterized by vertex waves, sleep spindles (12 to 14 Hz), maximal frontocentral region.  EEG showed continuous generalized 3 to 7 Hz theta-delta slowing. Generalized periodic discharges with triphasic morphology at 0.25-1 Hz were also noted, predominantly when awake/stimulated. Hyperventilation and photic stimulation were not performed.   ABNORMALITY - Periodic discharges with triphasic morphology, generalized ( GPDs) - Continuous slow, generalized IMPRESSION: This study showed generalized periodic discharges with triphasic morphology. This EEG pattern can be on the ictal interictal continuum.  However, the morphology, frequency and reactivity to stimulation is more commonly indicative of toxic-metabolic causes like hyperammonemia, uremia, cefepime toxicity. Additionally there is moderate to severe diffuse encephalopathy, nonspecific to etiology.  No seizures or  were seen throughout the recording. Lora Havens   DG CHEST PORT 1 VIEW  Result Date: 08/07/2021 CLINICAL DATA:  Respiratory failure EXAM: PORTABLE CHEST 1 VIEW COMPARISON:  Chest x-ray dated August 04, 2021 FINDINGS: Unchanged position of ET tube, left IJ line and feeding tube. Cardiac and mediastinal  contours are within normal limits. Increased bibasilar opacities. No large pleural effusion or pneumothorax. IMPRESSION: Increased bibasilar opacities, likely due to worsening atelectasis, infection or aspiration are additional considerations. Electronically Signed   By: Yetta Glassman M.D.   On: 08/07/2021 08:08   DG Abd Portable 1V  Result Date: 08/06/2021 CLINICAL DATA:  Feeding tube placement. EXAM: PORTABLE ABDOMEN - 1 VIEW COMPARISON:  08/17/2021 FINDINGS: Nasogastric tube has been removed.  Enteric feeding tube extends below the diaphragm, tip projecting in the expected location of the distal stomach. IMPRESSION: Enteric feeding tube tip projects in the distal stomach. Electronically Signed   By: Lajean Manes M.D.   On: 08/06/2021 13:03   MR LUMBAR SPINE WO CONTRAST  Result Date: 08/05/2021 CLINICAL DATA:  Sepsis.  Evaluate for osteomyelitis. EXAM: MRI CERVICAL, THORACIC AND LUMBAR SPINE WITHOUT CONTRAST TECHNIQUE: Multiplanar and multiecho pulse sequences of the cervical spine, to include the craniocervical junction and cervicothoracic junction, and thoracic and lumbar spine, were obtained without intravenous contrast. COMPARISON:  CT cervical spine and CT chest, abdomen, and pelvis from yesterday. FINDINGS: MRI CERVICAL SPINE FINDINGS Alignment: Unchanged straightening of the normal cervical lordosis with trace retrolisthesis at C3-C4. Vertebrae: No fracture, evidence of discitis, or bone lesion. Cord: Normal signal and morphology. Posterior Fossa, vertebral arteries, paraspinal tissues: Negative. Disc levels: C2-C3: Moderate disc bulging eccentric to the right, contacting the ventral cord. Mild spinal canal stenosis. No neuroforaminal stenosis. C3-C4: Moderate circumferential disc osteophyte complex and bilateral uncovertebral hypertrophy. Mild spinal canal stenosis. Severe bilateral neuroforaminal stenosis. C4-C5:  Small broad-based posterior disc protrusion.  No stenosis. C5-C6: Moderate  broad-based left paracentral disc protrusion with flattening of the left ventral cord. Mild bilateral uncovertebral hypertrophy. Mild to moderate central spinal canal stenosis. No neuroforaminal stenosis. C6-C7: Mild disc bulging and bilateral uncovertebral hypertrophy. No stenosis. C7-T1: Small medial left foraminal disc protrusion and mild bilateral facet arthropathy. No stenosis. MRI THORACIC SPINE FINDINGS Alignment:  Physiologic. Vertebrae: Abnormal T12 marrow signal described below. Remaining thoracic vertebral body marrow signal is normal. Chronic mild T11 superior endplate compression deformity. Cord:  Normal signal and morphology. Paraspinal and other soft tissues: 0.8 x 0.9 x 4.4 cm paraspinous fluid collection on the right at T11 and T12 (series 9, image 35; series 8, image 8). Trace bilateral pleural effusions. Right lower lobe atelectasis. Disc levels: Mild spinal canal stenosis at T10-T11 and T11-T12. Left subarticular and foraminal disc protrusion at T8-T9 with moderate lateral recess and neuroforaminal stenosis. Small right foraminal disc protrusion and mild right facet arthropathy at T10-T11 contribute to moderate right neuroforaminal stenosis. MRI LUMBAR SPINE FINDINGS Segmentation:  Standard. Alignment:  Physiologic. Vertebrae: Large amount of fluid within the T12-L1 disc with adjacent prominent marrow edema and decreased T1 marrow signal involving the T12 and L1 vertebral bodies. Pathologic fracture of the L1 vertebral body. Heterogeneous anterior epidural collection at T12 (series 8, image 1), with left ventral epidural extension superiorly up to T9. Conus medullaris and cauda equina: Conus extends to the L1-L2 level. Conus and cauda equina appear normal. Paraspinal and other soft tissues: Bilateral paravertebral abscesses at T12-L1 measuring 2.5 cm on the right and 2.1 cm on the left (series 8, images 4 and 6). Disc levels: T12-L1: Osteomyelitis discitis. Moderate spinal canal and bilateral  neuroforaminal stenosis. L1-L2:  Negative. L2-L3: Moderate disc bulging and mild bilateral facet arthropathy. Moderate spinal canal and mild to moderate bilateral neuroforaminal stenosis. L3-L4: Moderate disc bulging with superimposed right foraminal and extraforaminal disc protrusion. Mild bilateral facet arthropathy. Moderate to severe spinal canal stenosis. Severe right and moderate left neuroforaminal stenosis. L4-L5: Mild disc bulging and bilateral facet arthropathy. Mild spinal canal stenosis. No neuroforaminal stenosis. L5-S1: Negative disc. Mild bilateral facet arthropathy. No stenosis. IMPRESSION: Cervical spine: 1. No acute abnormality.  No osteomyelitis discitis. 2. Multilevel degenerative changes. Severe bilateral neuroforaminal stenosis at C3-C4. Thoracic and lumbar spine: 1. Findings consistent with osteomyelitis discitis at T12-L1 with pathologic fracture of the L1  vertebral body and bilateral paravertebral abscesses. 2. Small anterior epidural collection at T12 with left ventral epidural extension superiorly up to T9 suspicious for phlegmon and/or abscess. It is difficult to distinguish between the two without intravenous contrast. 3. 4.4 cm paraspinous fluid collection on the right at T11 and T12, suspicious for abscess. 4. Multilevel thoracolumbar spondylosis as described above. Moderate to severe spinal canal and severe right neuroforaminal stenosis at L3-L4. Electronically Signed   By: Titus Dubin M.D.   On: 08/05/2021 15:45   MR THORACIC SPINE WO CONTRAST  Result Date: 08/05/2021 CLINICAL DATA:  Sepsis.  Evaluate for osteomyelitis. EXAM: MRI CERVICAL, THORACIC AND LUMBAR SPINE WITHOUT CONTRAST TECHNIQUE: Multiplanar and multiecho pulse sequences of the cervical spine, to include the craniocervical junction and cervicothoracic junction, and thoracic and lumbar spine, were obtained without intravenous contrast. COMPARISON:  CT cervical spine and CT chest, abdomen, and pelvis from yesterday.  FINDINGS: MRI CERVICAL SPINE FINDINGS Alignment: Unchanged straightening of the normal cervical lordosis with trace retrolisthesis at C3-C4. Vertebrae: No fracture, evidence of discitis, or bone lesion. Cord: Normal signal and morphology. Posterior Fossa, vertebral arteries, paraspinal tissues: Negative. Disc levels: C2-C3: Moderate disc bulging eccentric to the right, contacting the ventral cord. Mild spinal canal stenosis. No neuroforaminal stenosis. C3-C4: Moderate circumferential disc osteophyte complex and bilateral uncovertebral hypertrophy. Mild spinal canal stenosis. Severe bilateral neuroforaminal stenosis. C4-C5:  Small broad-based posterior disc protrusion.  No stenosis. C5-C6: Moderate broad-based left paracentral disc protrusion with flattening of the left ventral cord. Mild bilateral uncovertebral hypertrophy. Mild to moderate central spinal canal stenosis. No neuroforaminal stenosis. C6-C7: Mild disc bulging and bilateral uncovertebral hypertrophy. No stenosis. C7-T1: Small medial left foraminal disc protrusion and mild bilateral facet arthropathy. No stenosis. MRI THORACIC SPINE FINDINGS Alignment:  Physiologic. Vertebrae: Abnormal T12 marrow signal described below. Remaining thoracic vertebral body marrow signal is normal. Chronic mild T11 superior endplate compression deformity. Cord:  Normal signal and morphology. Paraspinal and other soft tissues: 0.8 x 0.9 x 4.4 cm paraspinous fluid collection on the right at T11 and T12 (series 9, image 35; series 8, image 8). Trace bilateral pleural effusions. Right lower lobe atelectasis. Disc levels: Mild spinal canal stenosis at T10-T11 and T11-T12. Left subarticular and foraminal disc protrusion at T8-T9 with moderate lateral recess and neuroforaminal stenosis. Small right foraminal disc protrusion and mild right facet arthropathy at T10-T11 contribute to moderate right neuroforaminal stenosis. MRI LUMBAR SPINE FINDINGS Segmentation:  Standard. Alignment:   Physiologic. Vertebrae: Large amount of fluid within the T12-L1 disc with adjacent prominent marrow edema and decreased T1 marrow signal involving the T12 and L1 vertebral bodies. Pathologic fracture of the L1 vertebral body. Heterogeneous anterior epidural collection at T12 (series 8, image 1), with left ventral epidural extension superiorly up to T9. Conus medullaris and cauda equina: Conus extends to the L1-L2 level. Conus and cauda equina appear normal. Paraspinal and other soft tissues: Bilateral paravertebral abscesses at T12-L1 measuring 2.5 cm on the right and 2.1 cm on the left (series 8, images 4 and 6). Disc levels: T12-L1: Osteomyelitis discitis. Moderate spinal canal and bilateral neuroforaminal stenosis. L1-L2:  Negative. L2-L3: Moderate disc bulging and mild bilateral facet arthropathy. Moderate spinal canal and mild to moderate bilateral neuroforaminal stenosis. L3-L4: Moderate disc bulging with superimposed right foraminal and extraforaminal disc protrusion. Mild bilateral facet arthropathy. Moderate to severe spinal canal stenosis. Severe right and moderate left neuroforaminal stenosis. L4-L5: Mild disc bulging and bilateral facet arthropathy. Mild spinal canal stenosis. No neuroforaminal stenosis. L5-S1: Negative  disc. Mild bilateral facet arthropathy. No stenosis. IMPRESSION: Cervical spine: 1. No acute abnormality.  No osteomyelitis discitis. 2. Multilevel degenerative changes. Severe bilateral neuroforaminal stenosis at C3-C4. Thoracic and lumbar spine: 1. 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 2. Small anterior epidural collection at T12 with left ventral epidural extension superiorly up to T9 suspicious for phlegmon and/or abscess. It is difficult to distinguish between the two without intravenous contrast. 3. 4.4 cm paraspinous fluid collection on the right at T11 and T12, suspicious  for abscess. 4. Multilevel thoracolumbar spondylosis as described above. Moderate to severe spinal canal and severe right neuroforaminal stenosis at L3-L4. Electronically Signed   By: Titus Dubin M.D.   On: 08/05/2021 15:45   MR CERVICAL SPINE WO CONTRAST  Result Date: 08/05/2021 CLINICAL DATA:  Sepsis.  Evaluate for osteomyelitis. EXAM: MRI CERVICAL, THORACIC AND LUMBAR SPINE WITHOUT CONTRAST TECHNIQUE: Multiplanar and multiecho pulse sequences of the cervical spine, to include the craniocervical junction and cervicothoracic junction, and thoracic and lumbar spine, were obtained without intravenous contrast. COMPARISON:  CT cervical spine and CT chest, abdomen, and pelvis from yesterday. FINDINGS: MRI CERVICAL SPINE FINDINGS Alignment: Unchanged straightening of the normal cervical lordosis with trace retrolisthesis at C3-C4. Vertebrae: No fracture, evidence of discitis, or bone lesion. Cord: Normal signal and morphology. Posterior Fossa, vertebral arteries, paraspinal tissues: Negative. Disc levels: C2-C3: Moderate disc bulging eccentric to the right, contacting the ventral cord. Mild spinal canal stenosis. No neuroforaminal stenosis. C3-C4: Moderate circumferential disc osteophyte complex and bilateral uncovertebral hypertrophy. Mild spinal canal stenosis. Severe bilateral neuroforaminal stenosis. C4-C5:  Small broad-based posterior disc protrusion.  No stenosis. C5-C6: Moderate broad-based left paracentral disc protrusion with flattening of the left ventral cord. Mild bilateral uncovertebral hypertrophy. Mild to moderate central spinal canal stenosis. No neuroforaminal stenosis. C6-C7: Mild disc bulging and bilateral uncovertebral hypertrophy. No stenosis. C7-T1: Small medial left foraminal disc protrusion and mild bilateral facet arthropathy. No stenosis. MRI THORACIC SPINE FINDINGS Alignment:  Physiologic. Vertebrae: Abnormal T12 marrow signal described below. Remaining thoracic vertebral body marrow  signal is normal. Chronic mild T11 superior endplate compression deformity. Cord:  Normal signal and morphology. Paraspinal and other soft tissues: 0.8 x 0.9 x 4.4 cm paraspinous fluid collection on the right at T11 and T12 (series 9, image 35; series 8, image 8). Trace bilateral pleural effusions. Right lower lobe atelectasis. Disc levels: Mild spinal canal stenosis at T10-T11 and T11-T12. Left subarticular and foraminal disc protrusion at T8-T9 with moderate lateral recess and neuroforaminal stenosis. Small right foraminal disc protrusion and mild right facet arthropathy at T10-T11 contribute to moderate right neuroforaminal stenosis. MRI LUMBAR SPINE FINDINGS Segmentation:  Standard. Alignment:  Physiologic. Vertebrae: Large amount of fluid within the T12-L1 disc with adjacent prominent marrow edema and decreased T1 marrow signal involving the T12 and L1 vertebral bodies. Pathologic fracture of the L1 vertebral body. Heterogeneous anterior epidural collection at T12 (series 8, image 1), with left ventral epidural extension superiorly up to T9. Conus medullaris and cauda equina: Conus extends to the L1-L2 level. Conus and cauda equina appear normal. Paraspinal and other soft tissues: Bilateral paravertebral abscesses at T12-L1 measuring 2.5 cm on the right and 2.1 cm on the left (series 8, images 4 and 6). Disc levels: T12-L1: Osteomyelitis discitis. Moderate spinal canal and bilateral neuroforaminal stenosis. L1-L2:  Negative. L2-L3: Moderate disc bulging and mild bilateral facet arthropathy. Moderate spinal canal and mild to moderate bilateral neuroforaminal stenosis.  L3-L4: Moderate disc bulging with superimposed right foraminal and extraforaminal disc protrusion. Mild bilateral facet arthropathy. Moderate to severe spinal canal stenosis. Severe right and moderate left neuroforaminal stenosis. L4-L5: Mild disc bulging and bilateral facet arthropathy. Mild spinal canal stenosis. No neuroforaminal stenosis.  L5-S1: Negative disc. Mild bilateral facet arthropathy. No stenosis. IMPRESSION: Cervical spine: 1. No acute abnormality.  No osteomyelitis discitis. 2. Multilevel degenerative changes. Severe bilateral neuroforaminal stenosis at C3-C4. Thoracic and lumbar spine: 1. 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 2. Small anterior epidural collection at T12 with left ventral epidural extension superiorly up to T9 suspicious for phlegmon and/or abscess. It is difficult to distinguish between the two without intravenous contrast. 3. 4.4 cm paraspinous fluid collection on the right at T11 and T12, suspicious for abscess. 4. Multilevel thoracolumbar spondylosis as described above. Moderate to severe spinal canal and severe right neuroforaminal stenosis at L3-L4. Electronically Signed   By: Titus Dubin M.D.   On: 08/05/2021 15:45   MR BRAIN WO CONTRAST  Result Date: 08/05/2021 CLINICAL DATA:  Neuro deficit, acute, stroke suspected Mental status change, unknown cause EXAM: MRI HEAD WITHOUT CONTRAST TECHNIQUE: Multiplanar, multiecho pulse sequences of the brain and surrounding structures were obtained without intravenous contrast. COMPARISON:  CT head 07/30/2021. FINDINGS: Brain: A few small acute infarcts in left frontoparietal cortex (for example see series 5, images 27 through 29). No significant edema or mass effect. Small remote lacunar infarcts in the right caudate and left thalamus. Mild for age chronic microvascular ischemic disease. Mild for age cerebral atrophy. No evidence of acute hemorrhage, mass lesion, midline shift, extra-axial fluid collection, or hydrocephalus. Vascular: Major arterial flow voids are maintained skull base. Skull and upper cervical spine: Normal marrow signal. Sinuses/Orbits: Moderate right maxillary sinus mucosal thickening. No acute orbital abnormality. Other: Trace bilateral mastoid  effusions. IMPRESSION: 1. A few small acute infarcts in left frontoparietal cortex. No significant edema or mass effect. 2. Remote right caudate and left thalamus lacunar infarcts. Electronically Signed   By: Margaretha Sheffield M.D.   On: 08/05/2021 15:17   ECHOCARDIOGRAM COMPLETE  Result Date: 08/05/2021    ECHOCARDIOGRAM REPORT   Patient Name:   Courtney Goodman Date of Exam: 08/05/2021 Medical Rec #:  [id number]        Height:       65.0 in Accession #:    [id number]       Weight:       135.0 lb Date of Birth:  [birth year]         BSA:          1.674 m Patient Age:    62 years         BP:           107/49 mmHg Patient Gender: F                HR:           83 bpm. Exam Location:  Inpatient Procedure: 2D Echo, Cardiac Doppler, Color Doppler and Intracardiac            Opacification Agent Indications:    Stroke  History:        Patient has prior history of Echocardiogram examinations, most                 recent 04/21/2014. Respiratory failure requiring mechanical  ventilation.  Sonographer:    Merrie Roof RDCS Referring Phys: [id number] VINEET SOOD  Sonographer Comments: Echo performed with patient supine and on artificial respirator. Image acquisition challenging due to respiratory motion. IMPRESSIONS  1. Left ventricular ejection fraction, by estimation, is 50 to 55%. The left ventricle has low normal function. The left ventricle has no regional wall motion abnormalities. Left ventricular diastolic parameters are consistent with Grade I diastolic dysfunction (impaired relaxation).  2. Right ventricular systolic function is normal. The right ventricular size is normal.  3. The mitral valve is normal in structure. No evidence of mitral valve regurgitation. No evidence of mitral stenosis.  4. The aortic valve is normal in structure. Aortic valve regurgitation is not visualized. No aortic stenosis is present.  5. The inferior vena cava is normal in size with greater than 50% respiratory variability, suggesting  right atrial pressure of 3 mmHg. Conclusion(s)/Recommendation(s): No intracardiac source of embolism detected on this transthoracic study. Consider a transesophageal echocardiogram to exclude cardiac source of embolism if clinically indicated. FINDINGS  Left Ventricle: Left ventricular ejection fraction, by estimation, is 50 to 55%. The left ventricle has low normal function. The left ventricle has no regional wall motion abnormalities. Definity contrast agent was given IV to delineate the left ventricular endocardial borders. The left ventricular internal cavity size was normal in size. There is no left ventricular hypertrophy. Left ventricular diastolic parameters are consistent with Grade I diastolic dysfunction (impaired relaxation). Right Ventricle: The right ventricular size is normal. No increase in right ventricular wall thickness. Right ventricular systolic function is normal. Left Atrium: Left atrial size was normal in size. Right Atrium: Right atrial size was normal in size. Pericardium: There is no evidence of pericardial effusion. Mitral Valve: The mitral valve is normal in structure. No evidence of mitral valve regurgitation. No evidence of mitral valve stenosis. Tricuspid Valve: The tricuspid valve is normal in structure. Tricuspid valve regurgitation is mild . No evidence of tricuspid stenosis. Aortic Valve: The aortic valve is normal in structure. Aortic valve regurgitation is not visualized. No aortic stenosis is present. Aortic valve mean gradient measures 2.0 mmHg. Aortic valve peak gradient measures 4.4 mmHg. Aortic valve area, by VTI measures 2.44 cm. Pulmonic Valve: The pulmonic valve was normal in structure. Pulmonic valve regurgitation is not visualized. No evidence of pulmonic stenosis. Aorta: The aortic root is normal in size and structure. Venous: The inferior vena cava is normal in size with greater than 50% respiratory variability, suggesting right atrial pressure of 3 mmHg.  IAS/Shunts: No atrial level shunt detected by color flow Doppler.  LEFT VENTRICLE PLAX 2D LVIDd:         3.60 cm     Diastology LVIDs:         2.40 cm     LV e' medial:    8.05 cm/s LV PW:         1.20 cm     LV E/e' medial:  5.5 LV IVS:        1.00 cm     LV e' lateral:   9.57 cm/s LVOT diam:     1.90 cm     LV E/e' lateral: 4.6 LV SV:         46 LV SV Index:   27 LVOT Area:     2.84 cm  LV Volumes (MOD) LV vol d, MOD A2C: 71.4 ml LV vol d, MOD A4C: 50.9 ml LV vol s, MOD A2C: 35.8 ml LV vol s, MOD A4C:  30.2 ml LV SV MOD A2C:     35.6 ml LV SV MOD A4C:     50.9 ml LV SV MOD BP:      29.3 ml RIGHT VENTRICLE             IVC RV Basal diam:  3.40 cm     IVC diam: 2.10 cm RV S prime:     10.40 cm/s TAPSE (M-mode): 1.4 cm LEFT ATRIUM             Index        RIGHT ATRIUM          Index LA diam:        3.80 cm 2.27 cm/m   RA Area:     9.25 cm LA Vol (A2C):   30.1 ml 17.98 ml/m  RA Volume:   18.00 ml 10.75 ml/m LA Vol (A4C):   28.0 ml 16.73 ml/m LA Biplane Vol: 32.1 ml 19.18 ml/m  AORTIC VALVE AV Area (Vmax):    2.43 cm AV Area (Vmean):   2.40 cm AV Area (VTI):     2.44 cm AV Vmax:           105.00 cm/s AV Vmean:          68.700 cm/s AV VTI:            0.187 m AV Peak Grad:      4.4 mmHg AV Mean Grad:      2.0 mmHg LVOT Vmax:         90.10 cm/s LVOT Vmean:        58.200 cm/s LVOT VTI:          0.161 m LVOT/AV VTI ratio: 0.86  AORTA Ao Root diam: 2.60 cm MITRAL VALVE               TRICUSPID VALVE MV Area (PHT): 3.91 cm    TR Peak grad:   12.2 mmHg MV Decel Time: 194 msec    TR Vmax:        175.00 cm/s MV E velocity: 44.50 cm/s MV A velocity: 73.90 cm/s  SHUNTS MV E/A ratio:  0.60        Systemic VTI:  0.16 m                            Systemic Diam: 1.90 cm Candee Furbish MD Electronically signed by Candee Furbish MD Signature Date/Time: 08/05/2021/12:38:48 PM    Final    DG CHEST PORT 1 VIEW  Result Date: 07/29/2021 CLINICAL DATA:  Central line placement. EXAM: PORTABLE CHEST 1 VIEW COMPARISON:  Chest x-ray 08/02/2021  FINDINGS: Endotracheal tube tip is 2.2 cm above carina. Left-sided central venous catheter tip projects over the distal SVC. Enteric tube extends below the diaphragm. The cardiomediastinal silhouette is within normal limits. There is no focal lung infiltrate, pleural effusion or pneumothorax. No acute fractures are seen. IMPRESSION: 1. Left-sided central venous catheter tip projects over the distal SVC. 2.  No acute cardiopulmonary process. Electronically Signed   By: Ronney Asters M.D.   On: 08/15/2021 21:42   DG Abd 1 View  Result Date: 08/16/2021 CLINICAL DATA:  Orogastric tube placement. EXAM: ABDOMEN - 1 VIEW COMPARISON:  August 16 22 FINDINGS: Orogastric tube with tip likely transpyloric. Nonobstructive bowel gas pattern. Endotracheal tube partially visualized. No focal airspace consolidation. IMPRESSION: Orogastric tube with tip likely transpyloric. Electronically Signed   By: Linwood Dibbles.D.  On: 07/28/2021 17:41   DG Chest Portable 1 View  Result Date: 08/08/2021 CLINICAL DATA:  Endotracheal tube retraction EXAM: PORTABLE CHEST 1 VIEW COMPARISON:  Radiograph 08/05/2018 FINDINGS: Endotracheal tube overlies the trachea approximately 1.7 cm above the carina, slightly retracted in comparison to prior. Unchanged cardiomediastinal silhouette. There is no focal airspace consolidation. Streaky left basilar opacities, likely atelectasis. No pleural effusion or pneumothorax. Skin folds overlie the left chest. Bilateral shoulder degenerative changes. No acute osseous abnormality. Thoracic spondylosis. IMPRESSION: Endotracheal tube tip overlies the trachea approximately 1.7 cm above the carina, slightly retracted in comparison to prior. Could consider further retraction by 2.0 cm. Electronically Signed   By: Maurine Simmering M.D.   On: 08/02/2021 15:43   EEG adult  Result Date: 07/31/2021 Derek Jack, MD     08/20/2021  3:48 PM Routine EEG Report Courtney Goodman is a 76 y.o. female with a history of  encephalopathy who is undergoing an EEG to evaluate for seizures. Report: This EEG was acquired with electrodes placed according to the International 10-20 electrode system (including Fp1, Fp2, F3, F4, C3, C4, P3, P4, O1, O2, T3, T4, T5, T6, A1, A2, Fz, Cz, Pz). The following electrodes were missing or displaced: none. The best background was low amplitude 2-3 Hz. There was significant artifact throughout the recording due to EMG as she is biting the tube. With that caveat, no grossly epileptiform abnormalities or electrographic seizures were seen. No sleep architecture was identified. No focal slowing. Impression and clinical correlation: This EEG was obtained while patient was intubated and had recently received lorazepam and is abnormal due to severe diffuse slowing. There was significant artifact throughout the recording due to EMG as she is biting the tube. With that caveat, no grossly epileptiform abnormalities or electrographic seizures were seen. If clinical concern for seizures persist, consider hooking patient back up to continuous EEG after she returns from MRI. Su Monks, MD [facility] [phone] If 7pm- 7am, please page neurology on call as listed in [place].   DG Chest Port 1 View  Result Date: 08/05/2021 CLINICAL DATA:  ams, s/p intubation EXAM: PORTABLE CHEST 1 VIEW COMPARISON:  None Available. FINDINGS: Endotracheal tube tip is just above the carina. Recommend retraction by approximately 4.5 cm. No confluent consolidation. Mild streaky left basilar opacities, probably atelectasis. No visible pleural effusions or pneumothorax. IMPRESSION: 1. Endotracheal tube tip is just above the carina. Recommend retraction by approximately 4.5 cm. 2. Probable subsegmental left basilar atelectasis. Electronically Signed   By: Margaretha Sheffield M.D.   On: 08/12/2021 14:07   CT CHEST ABDOMEN PELVIS WO CONTRAST  Result Date: 08/23/2021 CLINICAL DATA:  Sepsis, altered mental status EXAM: CT  CHEST, ABDOMEN AND PELVIS WITHOUT CONTRAST TECHNIQUE: Multidetector CT imaging of the chest, abdomen and pelvis was performed following the standard protocol without IV contrast. RADIATION DOSE REDUCTION: This exam was performed according to the departmental dose-optimization program which includes automated exposure control, adjustment of the mA and/or kV according to patient size and/or use of iterative reconstruction technique. COMPARISON:  CT abdomen pelvis, 05/31/2021, MR abdomen, 06/04/2021 FINDINGS: CT CHEST FINDINGS Cardiovascular: Aortic atherosclerosis. Normal heart size. Three-vessel coronary artery calcifications. No pericardial effusion. Mediastinum/Nodes: No enlarged mediastinal, hilar, or axillary lymph nodes. Thyroid gland, trachea, and esophagus demonstrate no significant findings. Lungs/Pleura: Endotracheal intubation, tube tip within the left mainstem bronchus (series 5, image 44). Scattered areas of bandlike scarring throughout the lungs, with generally unchanged bronchiectasis, bronchiolar plugging, and consolidation in the left greater than right  lung bases (series 4, image 92) no pleural effusion or pneumothorax. Musculoskeletal: No chest wall mass. CT ABDOMEN PELVIS FINDINGS Hepatobiliary: No solid liver abnormality is seen. No gallstones, gallbladder wall thickening, or biliary dilatation. Pancreas: Previously seen pancreatic head lesion appears resolved (series 3, image 61). Did I no pancreatic ductal dilatation or surrounding inflammatory changes. Spleen: Normal in size without significant abnormality. Adrenals/Urinary Tract: Adrenal glands are unremarkable. Kidneys are normal, without renal calculi, solid lesion, or hydronephrosis. Bladder is unremarkable. Stomach/Bowel: Stomach is within normal limits. Appendix appears normal. No evidence of bowel wall thickening, distention, or inflammatory changes. Moderate burden of stool throughout the colon, with a large, dense stool ball in the  rectum measuring at least 11.1 cm (series 3, image 104, series 6, image 58). Vascular/Lymphatic: Aortic atherosclerosis and vascular calcinosis. No enlarged abdominal or pelvic lymph nodes. Benign, calcified portacaval lymph nodes. Reproductive: Status post hysterectomy. Other: No abdominal wall hernia or abnormality. No ascites. Musculoskeletal: Increasingly lytic appearance of a superior endplate wedge deformity of L1, with new development of gas within the disc space and vertebral body (series 6, image 61) IMPRESSION: 1. Endotracheal intubation, tube tip within the left mainstem bronchus. Recommend slight retraction. 2. Increasingly lytic appearance of a superior endplate wedge deformity of L1, with new development of gas within the disc space and vertebral body, concerning for discitis osteomyelitis in the setting of sepsis. Contrast enhanced MRI may be used to further evaluate if desired. 3. Unchanged bronchiectasis, bronchiolar plugging, and consolidation in the left greater than right lung bases, consistent with sequelae of infection or aspiration, which may be ongoing. 4. Previously seen pancreatic head lesion appears resolved, consistent with resolution of acute pancreatic fluid collection as characterized by prior MR. 5. Large, dense stool balls in the rectum. Correlate for fecal impaction. 6. Coronary artery disease. Aortic Atherosclerosis (ICD10-I70.0). Electronically Signed   By: Delanna Ahmadi M.D.   On: 08/24/2021 13:54   CT HEAD WO CONTRAST  Result Date: 08/01/2021 CLINICAL DATA:  Altered mental status EXAM: CT HEAD WITHOUT CONTRAST CT CERVICAL SPINE WITHOUT CONTRAST TECHNIQUE: Multidetector CT imaging of the head and cervical spine was performed following the standard protocol without intravenous contrast. Multiplanar CT image reconstructions of the cervical spine were also generated. RADIATION DOSE REDUCTION: This exam was performed according to the departmental dose-optimization program which  includes automated exposure control, adjustment of the mA and/or kV according to patient size and/or use of iterative reconstruction technique. COMPARISON:  05/31/2021 FINDINGS: CT HEAD FINDINGS Brain: No evidence of acute infarction, hemorrhage, hydrocephalus, extra-axial collection or mass lesion/mass effect. Periventricular and deep white matter hypodensity. Nonacute lacunar infarction of the right caudate (series 5, image 13) and left thalamus (series 5, image 14). Vascular: No hyperdense vessel or unexpected calcification. Skull: Normal. Negative for fracture or focal lesion. Sinuses/Orbits: No acute finding. Other: None. CT CERVICAL SPINE FINDINGS Alignment: Degenerative straightening of the normal cervical lordosis. Skull base and vertebrae: No acute fracture. No primary bone lesion or focal pathologic process. Soft tissues and spinal canal: No prevertebral fluid or swelling. No visible canal hematoma. Disc levels: Moderate disc space height loss and osteophytosis of C3-4 and C5-6 with otherwise mild disc space height loss and osteophytosis. Upper chest: Negative. Other: None. IMPRESSION: 1. No acute intracranial pathology. 2. Small-vessel white matter disease and nonacute lacunar infarctions of the right caudate and left thalamus. 3. No fracture or static subluxation of the cervical spine. Mild to moderate multilevel cervical disc degenerative disease. Electronically Signed   By: Cristie Hem  Cherly Beach M.D.   On: 08/19/2021 13:37   CT CERVICAL SPINE WO CONTRAST  Result Date: 08/15/2021 CLINICAL DATA:  Altered mental status EXAM: CT HEAD WITHOUT CONTRAST CT CERVICAL SPINE WITHOUT CONTRAST TECHNIQUE: Multidetector CT imaging of the head and cervical spine was performed following the standard protocol without intravenous contrast. Multiplanar CT image reconstructions of the cervical spine were also generated. RADIATION DOSE REDUCTION: This exam was performed according to the departmental dose-optimization program  which includes automated exposure control, adjustment of the mA and/or kV according to patient size and/or use of iterative reconstruction technique. COMPARISON:  05/31/2021 FINDINGS: CT HEAD FINDINGS Brain: No evidence of acute infarction, hemorrhage, hydrocephalus, extra-axial collection or mass lesion/mass effect. Periventricular and deep white matter hypodensity. Nonacute lacunar infarction of the right caudate (series 5, image 13) and left thalamus (series 5, image 14). Vascular: No hyperdense vessel or unexpected calcification. Skull: Normal. Negative for fracture or focal lesion. Sinuses/Orbits: No acute finding. Other: None. CT CERVICAL SPINE FINDINGS Alignment: Degenerative straightening of the normal cervical lordosis. Skull base and vertebrae: No acute fracture. No primary bone lesion or focal pathologic process. Soft tissues and spinal canal: No prevertebral fluid or swelling. No visible canal hematoma. Disc levels: Moderate disc space height loss and osteophytosis of C3-4 and C5-6 with otherwise mild disc space height loss and osteophytosis. Upper chest: Negative. Other: None. IMPRESSION: 1. No acute intracranial pathology. 2. Small-vessel white matter disease and nonacute lacunar infarctions of the right caudate and left thalamus. 3. No fracture or static subluxation of the cervical spine. Mild to moderate multilevel cervical disc degenerative disease. Electronically Signed   By: Delanna Ahmadi M.D.   On: 08/21/2021 13:37    Microbiology: No results found for this or any previous visit (from the past 240 hour(s)).  Time spent: 35 minutes  Signed: Oren Binet, MD 09/14/21

## 2021-08-28 DEATH — deceased

## 2021-09-06 ENCOUNTER — Inpatient Hospital Stay: Payer: Medicare HMO

## 2021-09-06 ENCOUNTER — Inpatient Hospital Stay: Payer: Medicare HMO | Admitting: Internal Medicine

## 2021-09-18 ENCOUNTER — Ambulatory Visit: Payer: Medicare HMO | Admitting: Internal Medicine

## 2022-02-06 ENCOUNTER — Ambulatory Visit: Payer: Medicare HMO | Admitting: Physician Assistant
